# Patient Record
Sex: Female | Born: 1985
Health system: Southern US, Community
[De-identification: ages and names within clinical notes are randomized; demographics above are authoritative.]

## PROBLEM LIST (undated history)

## (undated) ENCOUNTER — Inpatient Hospital Stay (HOSPITAL_COMMUNITY): Payer: Self-pay

## (undated) DIAGNOSIS — K219 Gastro-esophageal reflux disease without esophagitis: Secondary | ICD-10-CM

## (undated) DIAGNOSIS — F1911 Other psychoactive substance abuse, in remission: Secondary | ICD-10-CM

## (undated) DIAGNOSIS — L409 Psoriasis, unspecified: Secondary | ICD-10-CM

## (undated) DIAGNOSIS — J45909 Unspecified asthma, uncomplicated: Secondary | ICD-10-CM

## (undated) DIAGNOSIS — O139 Gestational [pregnancy-induced] hypertension without significant proteinuria, unspecified trimester: Secondary | ICD-10-CM

## (undated) DIAGNOSIS — R87629 Unspecified abnormal cytological findings in specimens from vagina: Secondary | ICD-10-CM

## (undated) DIAGNOSIS — F329 Major depressive disorder, single episode, unspecified: Secondary | ICD-10-CM

## (undated) DIAGNOSIS — A599 Trichomoniasis, unspecified: Secondary | ICD-10-CM

## (undated) DIAGNOSIS — F419 Anxiety disorder, unspecified: Secondary | ICD-10-CM

## (undated) DIAGNOSIS — G8929 Other chronic pain: Secondary | ICD-10-CM

## (undated) DIAGNOSIS — F32A Depression, unspecified: Secondary | ICD-10-CM

## (undated) DIAGNOSIS — C801 Malignant (primary) neoplasm, unspecified: Secondary | ICD-10-CM

## (undated) DIAGNOSIS — Z803 Family history of malignant neoplasm of breast: Secondary | ICD-10-CM

## (undated) DIAGNOSIS — D649 Anemia, unspecified: Secondary | ICD-10-CM

## (undated) HISTORY — DX: Family history of malignant neoplasm of breast: Z80.3

## (undated) HISTORY — PX: COLPOSCOPY: SHX161

## (undated) HISTORY — DX: Other psychoactive substance abuse, in remission: F19.11

## (undated) HISTORY — DX: Psoriasis, unspecified: L40.9

## (undated) HISTORY — PX: WISDOM TOOTH EXTRACTION: SHX21

---
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
## 2008-05-26 DIAGNOSIS — I10 Essential (primary) hypertension: Secondary | ICD-10-CM

## 2016-07-22 ENCOUNTER — Emergency Department (HOSPITAL_COMMUNITY): Payer: Medicaid Other

## 2016-07-22 ENCOUNTER — Emergency Department (HOSPITAL_COMMUNITY)
Admission: EM | Admit: 2016-07-22 | Discharge: 2016-07-22 | Disposition: A | Discharge status: Home / Self Care | Payer: Medicaid Other | Attending: Emergency Medicine | Admitting: Emergency Medicine

## 2016-07-22 ENCOUNTER — Encounter (HOSPITAL_COMMUNITY): Payer: Self-pay | Admitting: *Deleted

## 2016-07-22 DIAGNOSIS — F17200 Nicotine dependence, unspecified, uncomplicated: Secondary | ICD-10-CM | POA: Insufficient documentation

## 2016-07-22 DIAGNOSIS — R1030 Lower abdominal pain, unspecified: Secondary | ICD-10-CM | POA: Diagnosis present

## 2016-07-22 DIAGNOSIS — B9689 Other specified bacterial agents as the cause of diseases classified elsewhere: Secondary | ICD-10-CM

## 2016-07-22 DIAGNOSIS — N760 Acute vaginitis: Secondary | ICD-10-CM | POA: Insufficient documentation

## 2016-07-22 DIAGNOSIS — R102 Pelvic and perineal pain: Secondary | ICD-10-CM | POA: Insufficient documentation

## 2016-07-22 DIAGNOSIS — Z79899 Other long term (current) drug therapy: Secondary | ICD-10-CM | POA: Diagnosis not present

## 2016-07-22 LAB — URINALYSIS, ROUTINE W REFLEX MICROSCOPIC
Bilirubin Urine: NEGATIVE
GLUCOSE, UA: NEGATIVE mg/dL
Hgb urine dipstick: NEGATIVE
KETONES UR: NEGATIVE mg/dL
LEUKOCYTES UA: NEGATIVE
NITRITE: NEGATIVE
PH: 7 (ref 5.0–8.0)
Protein, ur: NEGATIVE mg/dL
SPECIFIC GRAVITY, URINE: 1.027 (ref 1.005–1.030)

## 2016-07-22 LAB — CBC
HEMATOCRIT: 38.9 % (ref 36.0–46.0)
HEMOGLOBIN: 12.8 g/dL (ref 12.0–15.0)
MCH: 31.8 pg (ref 26.0–34.0)
MCHC: 32.9 g/dL (ref 30.0–36.0)
MCV: 96.5 fL (ref 78.0–100.0)
Platelets: 261 10*3/uL (ref 150–400)
RBC: 4.03 MIL/uL (ref 3.87–5.11)
RDW: 13 % (ref 11.5–15.5)
WBC: 6.9 10*3/uL (ref 4.0–10.5)

## 2016-07-22 LAB — COMPREHENSIVE METABOLIC PANEL
ALBUMIN: 3.5 g/dL (ref 3.5–5.0)
ALT: 12 U/L — ABNORMAL LOW (ref 14–54)
ANION GAP: 6 (ref 5–15)
AST: 18 U/L (ref 15–41)
Alkaline Phosphatase: 55 U/L (ref 38–126)
BILIRUBIN TOTAL: 0.4 mg/dL (ref 0.3–1.2)
BUN: 11 mg/dL (ref 6–20)
CHLORIDE: 108 mmol/L (ref 101–111)
CO2: 25 mmol/L (ref 22–32)
Calcium: 8.9 mg/dL (ref 8.9–10.3)
Creatinine, Ser: 1.06 mg/dL — ABNORMAL HIGH (ref 0.44–1.00)
GFR calc Af Amer: >60 mL/min (ref >60)
GLUCOSE: 82 mg/dL (ref 65–99)
POTASSIUM: 3.9 mmol/L (ref 3.5–5.1)
Sodium: 139 mmol/L (ref 135–145)
TOTAL PROTEIN: 6.4 g/dL — AB (ref 6.5–8.1)

## 2016-07-22 LAB — WET PREP, GENITAL
SPERM: NONE SEEN
TRICH WET PREP: NONE SEEN
YEAST WET PREP: NONE SEEN

## 2016-07-22 LAB — I-STAT BETA HCG BLOOD, ED (MC, WL, AP ONLY): I-stat hCG, quantitative: <5 m[IU]/mL (ref <5)

## 2016-07-22 LAB — LIPASE, BLOOD: LIPASE: 18 U/L (ref 11–51)

## 2016-07-22 MED ORDER — METRONIDAZOLE 500 MG PO TABS: 500.0000 mg | ORAL_TABLET | Freq: Two times a day (BID) | ORAL | 0 refills | Status: DC

## 2016-07-22 NOTE — ED Notes (Signed)
Pt is in stable condition upon d/c and ambulates from ED. 

## 2016-07-22 NOTE — ED Provider Notes (Signed)
[facility] DEPT Provider Note   CSN: [id number] Arrival date & time: 07/22/16  1254  First Provider Contact:  First MD Initiated Contact with Patient 07/22/16 1413        History   Chief Complaint Chief Complaint  Patient presents with  . Abdominal Pain    HPI Terri Estes is a 30 y.o. female.  HPI   Patient presents with 1 month of lower abdominal pain and pressure that feels like "something needs to come out."  Had two periods this month.  Odd vaginal smell without discharge.  LMP July 18.  Denies urinary symptoms.  Took laxative due to pressure and has had normal but increased frequency bowel movements.  This did not change her symptoms.  Is sexually active, does not use protection, no new sexual partners.  No known fevers, N/V.    History reviewed. No pertinent past medical history.  There are no active problems to display for this patient.   History reviewed. No pertinent surgical history.  OB History    No data available       Home Medications    Prior to Admission medications   Medication Sig Start Date End Date Taking? Authorizing Provider  metroNIDAZOLE (FLAGYL) 500 MG tablet Take 1 tablet (500 mg total) by mouth 2 (two) times daily. 07/22/16   Clayton Bibles, PA-C    Family History History reviewed. No pertinent family history.  Social History Social History  Substance Use Topics  . Smoking status: Current Some Day Smoker  . Smokeless tobacco: Never Used  . Alcohol use No     Allergies   Review of patient's allergies indicates no known allergies.   Review of Systems Review of Systems  All other systems reviewed and are negative.    Physical Exam Updated Vital Signs BP 124/74   Pulse 61   Temp 98.4 F (36.9 C) (Oral)   Resp 18   LMP 07/13/2016   SpO2 100%   Physical Exam  Constitutional: She appears well-developed and well-nourished. No distress.  HENT:  Head: Normocephalic and atraumatic.  Neck: Neck supple.    Cardiovascular: Normal rate and regular rhythm.   Pulmonary/Chest: Effort normal and breath sounds normal. No respiratory distress. She has no wheezes. She has no rales.  Abdominal: Soft. She exhibits no distension. There is tenderness. There is no rebound and no guarding.  Genitourinary:  Genitourinary Comments: Small amount of thick white discharge in vagina.  No cervical motion tenderness.  Midline and right adnexal tenderness on bimanual exam without palpation of mass, no fullness, no left sided tenderness.    Neurological: She is alert.  Skin: She is not diaphoretic.  Nursing note and vitals reviewed.    ED Treatments / Results  Labs (all labs ordered are listed, but only abnormal results are displayed) Labs Reviewed  WET PREP, GENITAL - Abnormal; Notable for the following:       Result Value   Clue Cells Wet Prep HPF POC PRESENT (*)    WBC, Wet Prep HPF POC MODERATE (*)    All other components within normal limits  COMPREHENSIVE METABOLIC PANEL - Abnormal; Notable for the following:    Creatinine, Ser 1.06 (*)    Total Protein 6.4 (*)    ALT 12 (*)    All other components within normal limits  LIPASE, BLOOD  CBC  URINALYSIS, ROUTINE W REFLEX MICROSCOPIC (NOT AT [facility])  RPR  HIV ANTIBODY (ROUTINE TESTING)  I-STAT BETA HCG BLOOD, ED ([facility], [facility], [facility] ONLY)  GC/CHLAMYDIA PROBE AMP ([place]) NOT AT [facility]    EKG  EKG Interpretation None       Radiology US Transvaginal Non-ob  Result Date: 07/22/2016 CLINICAL DATA:  Pelvic and right adnexal pain for the past month, history of pelvic inflammatory disease and previous Cesarean section. Onset of last normal menstrual period was July 13, 2016 EXAM: TRANSABDOMINAL AND TRANSVAGINAL ULTRASOUND OF PELVIS DOPPLER ULTRASOUND OF OVARIES TECHNIQUE: Both transabdominal and transvaginal ultrasound examinations of the pelvis were performed. Transabdominal technique was performed for global imaging of the pelvis including uterus, ovaries,  adnexal regions, and pelvic cul-de-sac. It was necessary to proceed with endovaginal exam following the transabdominal exam to visualize the uterus, endometrium, ovaries, and adnexal regions. Color and duplex Doppler ultrasound was utilized to evaluate blood flow to the ovaries. COMPARISON:  None in PACs FINDINGS: Uterus Measurements: 7.8 x 4.5 x 4.7 cm. The uterus exhibits normal echotexture. No fibroids are observed. Endometrium Thickness: 4.5 mm. No endometrial masses or fluid collections are observed. Right ovary Measurements: 2.9 x 2.0 x 2.6 cm. There are multiple developing follicles with the largest measuring 1.7 cm in greatest dimension. Left ovary Measurements: 2.5 x 1.9 x 2.0 cm. There are multiple developing follicles with the largest measuring 1.2 cm in greatest dimension. Pulsed Doppler evaluation of both ovaries demonstrates normal low-resistance arterial and venous waveforms. Other findings There is a trace of free pelvic fluid. IMPRESSION: 1. No evidence of a tubo-ovarian abscess nor other acute adnexal abnormality. No evidence of ectopic pregnancy 2. Developing follicles within both ovaries. 3. Normal appearance of the uterus and endometrium. No evidence of IUP 4. Trace amount of free pelvic fluid. Electronically Signed   By: David  Martinique M.D.   On: 07/22/2016 17:15  US Pelvis Complete  Result Date: 07/22/2016 CLINICAL DATA:  Pelvic and right adnexal pain for the past month, history of pelvic inflammatory disease and previous Cesarean section. Onset of last normal menstrual period was July 13, 2016 EXAM: TRANSABDOMINAL AND TRANSVAGINAL ULTRASOUND OF PELVIS DOPPLER ULTRASOUND OF OVARIES TECHNIQUE: Both transabdominal and transvaginal ultrasound examinations of the pelvis were performed. Transabdominal technique was performed for global imaging of the pelvis including uterus, ovaries, adnexal regions, and pelvic cul-de-sac. It was necessary to proceed with endovaginal exam following the  transabdominal exam to visualize the uterus, endometrium, ovaries, and adnexal regions. Color and duplex Doppler ultrasound was utilized to evaluate blood flow to the ovaries. COMPARISON:  None in PACs FINDINGS: Uterus Measurements: 7.8 x 4.5 x 4.7 cm. The uterus exhibits normal echotexture. No fibroids are observed. Endometrium Thickness: 4.5 mm. No endometrial masses or fluid collections are observed. Right ovary Measurements: 2.9 x 2.0 x 2.6 cm. There are multiple developing follicles with the largest measuring 1.7 cm in greatest dimension. Left ovary Measurements: 2.5 x 1.9 x 2.0 cm. There are multiple developing follicles with the largest measuring 1.2 cm in greatest dimension. Pulsed Doppler evaluation of both ovaries demonstrates normal low-resistance arterial and venous waveforms. Other findings There is a trace of free pelvic fluid. IMPRESSION: 1. No evidence of a tubo-ovarian abscess nor other acute adnexal abnormality. No evidence of ectopic pregnancy 2. Developing follicles within both ovaries. 3. Normal appearance of the uterus and endometrium. No evidence of IUP 4. Trace amount of free pelvic fluid. Electronically Signed   By: David  Martinique M.D.   On: 07/22/2016 17:15  [place] Art/ven Flow Abd Pelv Doppler  Result Date: 07/22/2016 CLINICAL DATA:  Pelvic and right adnexal pain for the past month, history  of pelvic inflammatory disease and previous Cesarean section. Onset of last normal menstrual period was July 13, 2016 EXAM: TRANSABDOMINAL AND TRANSVAGINAL ULTRASOUND OF PELVIS DOPPLER ULTRASOUND OF OVARIES TECHNIQUE: Both transabdominal and transvaginal ultrasound examinations of the pelvis were performed. Transabdominal technique was performed for global imaging of the pelvis including uterus, ovaries, adnexal regions, and pelvic cul-de-sac. It was necessary to proceed with endovaginal exam following the transabdominal exam to visualize the uterus, endometrium, ovaries, and adnexal regions. Color and  duplex Doppler ultrasound was utilized to evaluate blood flow to the ovaries. COMPARISON:  None in PACs FINDINGS: Uterus Measurements: 7.8 x 4.5 x 4.7 cm. The uterus exhibits normal echotexture. No fibroids are observed. Endometrium Thickness: 4.5 mm. No endometrial masses or fluid collections are observed. Right ovary Measurements: 2.9 x 2.0 x 2.6 cm. There are multiple developing follicles with the largest measuring 1.7 cm in greatest dimension. Left ovary Measurements: 2.5 x 1.9 x 2.0 cm. There are multiple developing follicles with the largest measuring 1.2 cm in greatest dimension. Pulsed Doppler evaluation of both ovaries demonstrates normal low-resistance arterial and venous waveforms. Other findings There is a trace of free pelvic fluid. IMPRESSION: 1. No evidence of a tubo-ovarian abscess nor other acute adnexal abnormality. No evidence of ectopic pregnancy 2. Developing follicles within both ovaries. 3. Normal appearance of the uterus and endometrium. No evidence of IUP 4. Trace amount of free pelvic fluid. Electronically Signed   By: David  Martinique M.D.   On: 07/22/2016 17:15   Procedures Procedures (including critical care time)  Medications Ordered in ED Medications - No data to display   Initial Impression / Assessment and Plan / ED Course  I have reviewed the triage vital signs and the nursing notes.  Pertinent labs & imaging results that were available during my care of the patient were reviewed by me and considered in my medical decision making (see chart for details).  Clinical Course    Afebrile, nontoxic patient with 1 month of pelvic pain without urinary or bowel symptoms.  Malodor from vagina.  Does have BV.  No clinical e/o PID.  Pt feels she is low risk, declines empiric treatment for STDs.  She is aware these are pending.  Pelvic US unremarkable.   D/C home with flagyl, resources for follow up.   Discussed result, findings, treatment, and follow up  with patient.  Pt given  return precautions.  Pt verbalizes understanding and agrees with plan.       Final Clinical Impressions(s) / ED Diagnoses   Final diagnoses:  Pelvic pain in female  BV (bacterial vaginosis)    New Prescriptions New Prescriptions   METRONIDAZOLE (FLAGYL) 500 MG TABLET    Take 1 tablet (500 mg total) by mouth 2 (two) times daily.     Clayton Bibles, PA-C 07/22/16 [place], MD 07/26/16 [phone]

## 2016-07-22 NOTE — ED Notes (Signed)
Pelvic Cart set up and at bedside ?

## 2016-07-22 NOTE — ED Triage Notes (Signed)
Pt reports lower abd pain for extended amount of time. Had intercourse last night and then reports swelling to vaginal area. Denies urinary symptoms or vaginal discharge.

## 2016-07-22 NOTE — Discharge Instructions (Signed)
Read the information below.  Use the prescribed medication as directed.  Please discuss all new medications with your pharmacist.  You may return to the Emergency Department at any time for worsening condition or any new symptoms that concern you.   If you develop high fevers, worsening abdominal pain, uncontrolled vomiting, or are unable to tolerate fluids by mouth, return to the ER for a recheck.  ° °

## 2016-07-23 LAB — HIV ANTIBODY (ROUTINE TESTING W REFLEX): HIV Screen 4th Generation wRfx: NONREACTIVE

## 2016-07-23 LAB — GC/CHLAMYDIA PROBE AMP (~~LOC~~) NOT AT ARMC
Chlamydia: NEGATIVE
NEISSERIA GONORRHEA: NEGATIVE

## 2016-07-23 LAB — RPR: RPR Ser Ql: NONREACTIVE

## 2016-11-16 ENCOUNTER — Ambulatory Visit (INDEPENDENT_AMBULATORY_CARE_PROVIDER_SITE_OTHER): Payer: Medicaid Other | Admitting: *Deleted

## 2016-11-16 DIAGNOSIS — N912 Amenorrhea, unspecified: Secondary | ICD-10-CM

## 2016-11-16 DIAGNOSIS — Z3201 Encounter for pregnancy test, result positive: Secondary | ICD-10-CM

## 2016-11-16 LAB — POCT URINE PREGNANCY: Preg Test, Ur: POSITIVE — AB

## 2016-11-16 NOTE — Progress Notes (Signed)
Patient states she is concerned about her kidneys and wanted to make sure that her provider had access to her hospital records- she was seen at [facility] ED.  [redacted]w[redacted]d EDD 07/14/2017 Patient is taking prenatal gummies OTC and will continue. NOB appointment to be scheduled

## 2016-12-06 ENCOUNTER — Inpatient Hospital Stay (HOSPITAL_COMMUNITY): Payer: Medicaid Other

## 2016-12-06 ENCOUNTER — Encounter (HOSPITAL_COMMUNITY): Payer: Self-pay | Admitting: *Deleted

## 2016-12-06 ENCOUNTER — Inpatient Hospital Stay (HOSPITAL_COMMUNITY)
Admission: AD | Admit: 2016-12-06 | Discharge: 2016-12-06 | Disposition: A | Discharge status: Home / Self Care | Payer: Medicaid Other | Source: Ambulatory Visit | Attending: Family Medicine | Admitting: Family Medicine

## 2016-12-06 DIAGNOSIS — R109 Unspecified abdominal pain: Secondary | ICD-10-CM | POA: Diagnosis not present

## 2016-12-06 DIAGNOSIS — Z87891 Personal history of nicotine dependence: Secondary | ICD-10-CM | POA: Diagnosis not present

## 2016-12-06 DIAGNOSIS — O219 Vomiting of pregnancy, unspecified: Secondary | ICD-10-CM

## 2016-12-06 DIAGNOSIS — O210 Mild hyperemesis gravidarum: Secondary | ICD-10-CM | POA: Diagnosis present

## 2016-12-06 DIAGNOSIS — Z3A08 8 weeks gestation of pregnancy: Secondary | ICD-10-CM | POA: Diagnosis not present

## 2016-12-06 DIAGNOSIS — O9989 Other specified diseases and conditions complicating pregnancy, childbirth and the puerperium: Secondary | ICD-10-CM

## 2016-12-06 DIAGNOSIS — O26899 Other specified pregnancy related conditions, unspecified trimester: Secondary | ICD-10-CM

## 2016-12-06 HISTORY — DX: Other chronic pain: G89.29

## 2016-12-06 HISTORY — DX: Gestational (pregnancy-induced) hypertension without significant proteinuria, unspecified trimester: O13.9

## 2016-12-06 LAB — URINALYSIS, ROUTINE W REFLEX MICROSCOPIC
Bilirubin Urine: NEGATIVE
GLUCOSE, UA: NEGATIVE mg/dL
HGB URINE DIPSTICK: NEGATIVE
KETONES UR: 80 mg/dL — AB
Nitrite: NEGATIVE
PROTEIN: 100 mg/dL — AB
Specific Gravity, Urine: 1.033 — ABNORMAL HIGH (ref 1.005–1.030)
pH: 5 (ref 5.0–8.0)

## 2016-12-06 LAB — CBC
HCT: 37.1 % (ref 36.0–46.0)
Hemoglobin: 13.2 g/dL (ref 12.0–15.0)
MCH: 32 pg (ref 26.0–34.0)
MCHC: 35.6 g/dL (ref 30.0–36.0)
MCV: 90 fL (ref 78.0–100.0)
Platelets: 275 10*3/uL (ref 150–400)
RBC: 4.12 MIL/uL (ref 3.87–5.11)
RDW: 12.7 % (ref 11.5–15.5)
WBC: 9.4 10*3/uL (ref 4.0–10.5)

## 2016-12-06 LAB — URINALYSIS, MICROSCOPIC (REFLEX)

## 2016-12-06 LAB — ABO/RH: ABO/RH(D): A POS

## 2016-12-06 LAB — HCG, QUANTITATIVE, PREGNANCY: HCG, BETA CHAIN, QUANT, S: 116810 m[IU]/mL — AB (ref <5)

## 2016-12-06 MED ORDER — LACTATED RINGERS IV BOLUS (SEPSIS)
1000.0000 mL | Freq: Once | INTRAVENOUS | Status: AC
  Administered 2016-12-06: 1000 mL via INTRAVENOUS

## 2016-12-06 MED ORDER — FAMOTIDINE IN NACL 20-0.9 MG/50ML-% IV SOLN
20.0000 mg | Freq: Once | INTRAVENOUS | Status: AC
  Administered 2016-12-06: 20 mg via INTRAVENOUS
  Filled 2016-12-06: qty 50

## 2016-12-06 MED ORDER — RANITIDINE HCL 150 MG PO TABS: 150.0000 mg | ORAL_TABLET | Freq: Two times a day (BID) | ORAL | 0 refills | Status: DC

## 2016-12-06 MED ORDER — DEXTROSE 5 % IN LACTATED RINGERS IV BOLUS
1000.0000 mL | Freq: Once | INTRAVENOUS | Status: AC
  Administered 2016-12-06: 1000 mL via INTRAVENOUS

## 2016-12-06 MED ORDER — METOCLOPRAMIDE HCL 10 MG PO TABS: 10.0000 mg | ORAL_TABLET | Freq: Three times a day (TID) | ORAL | 1 refills | Status: DC

## 2016-12-06 MED ORDER — METOCLOPRAMIDE HCL 5 MG/ML IJ SOLN
10.0000 mg | Freq: Once | INTRAMUSCULAR | Status: AC
  Administered 2016-12-06: 10 mg via INTRAVENOUS
  Filled 2016-12-06: qty 2

## 2016-12-06 NOTE — MAU Note (Signed)
Patient states she has not been able to keep anything down since finding out she was pregnant.  Has tried taking Unisom and B6 as well as an OTC motion sickness medication she couldn't remember the name of and neither is working.  States she has had abdominal cramping that she thinks is related to being constipated.  Also having back pain.

## 2016-12-06 NOTE — Discharge Instructions (Signed)
Abdominal Pain During Pregnancy °Belly (abdominal) pain is common during pregnancy. Most of the time, it is not a serious problem. Other times, it can be a sign that something is wrong with the pregnancy. Always tell your doctor if you have belly pain. °Follow these instructions at home: °Monitor your belly pain for any changes. The following actions may help you feel better: °· Do not have sex (intercourse) or put anything in your vagina until you feel better. °· Rest until your pain stops. °· Drink clear fluids if you feel sick to your stomach (nauseous). Do not eat solid food until you feel better. °· Only take medicine as told by your doctor. °· Keep all doctor visits as told. °Get help right away if: °· You are bleeding, leaking fluid, or pieces of tissue come out of your vagina. °· You have more pain or cramping. °· You keep throwing up (vomiting). °· You have pain when you pee (urinate) or have blood in your pee. °· You have a fever. °· You do not feel your baby moving as much. °· You feel very weak or feel like passing out. °· You have trouble breathing, with or without belly pain. °· You have a very bad headache and belly pain. °· You have fluid leaking from your vagina and belly pain. °· You keep having watery poop (diarrhea). °· Your belly pain does not go away after resting, or the pain gets worse. °This information is not intended to replace advice given to you by your health care provider. Make sure you discuss any questions you have with your health care provider. °Document Released: 12/01/2009 Document Revised: 07/21/2016 Document Reviewed: 07/12/2013 °Elsevier Interactive Patient Education © 2017 [employer] ° °

## 2016-12-06 NOTE — MAU Provider Note (Signed)
History     CSN: [id number]  Arrival date and time: 12/06/16 [id number]   First Provider Initiated Contact with Patient 12/06/16 0910      Chief Complaint  Patient presents with  . Morning Sickness  . Dizziness   HPI   Ms. Terri Estes is a 30 y.o. female [phone] @ [redacted]w[redacted]d here in MAU with abdominal pain and vomiting. She is vomiting more than 5 times per day and is spitting a lot too. The pain Is located all over her abdomen. The pain comes and goes. She feels dehydrated.   She denies vaginal bleeding.   OB History    Gravida Para Term Preterm AB Living   5 1 0 1 3 1    SAB TAB Ectopic Multiple Live Births                  Past Medical History:  Diagnosis Date  . Chronic pain   . Pregnancy induced hypertension     Past Surgical History:  Procedure Laterality Date  . CESAREAN SECTION      History reviewed. No pertinent family history.  Social History  Substance Use Topics  . Smoking status: Former Smoker    Types: Cigarettes  . Smokeless tobacco: Never Used  . Alcohol use No    Allergies: No Known Allergies  No prescriptions prior to admission.   Results for orders placed or performed during the hospital encounter of 12/06/16 (from the past 48 hour(s))  Urinalysis, Routine w reflex microscopic     Status: Abnormal   Collection Time: 12/06/16  8:33 AM  Result Value Ref Range   Color, Urine YELLOW YELLOW   APPearance TURBID (A) CLEAR   Specific Gravity, Urine 1.033 (H) 1.005 - 1.030   pH 5.0 5.0 - 8.0   Glucose, UA NEGATIVE NEGATIVE mg/dL   Hgb urine dipstick NEGATIVE NEGATIVE   Bilirubin Urine NEGATIVE NEGATIVE   Ketones, ur 80 (A) NEGATIVE mg/dL   Protein, ur 100 (A) NEGATIVE mg/dL   Nitrite NEGATIVE NEGATIVE   Leukocytes, UA MODERATE (A) NEGATIVE  Urinalysis, Microscopic (reflex)     Status: Abnormal   Collection Time: 12/06/16  8:33 AM  Result Value Ref Range   RBC / HPF 6-30 0 - 5 RBC/hpf   WBC, UA 6-30 0 - 5 WBC/hpf   Bacteria, UA RARE (A)  NONE SEEN   Squamous Epithelial / LPF TOO NUMEROUS TO COUNT (A) NONE SEEN   Mucous PRESENT   CBC     Status: None   Collection Time: 12/06/16  9:30 AM  Result Value Ref Range   WBC 9.4 4.0 - 10.5 K/uL   RBC 4.12 3.87 - 5.11 MIL/uL   Hemoglobin 13.2 12.0 - 15.0 g/dL   HCT 37.1 36.0 - 46.0 %   MCV 90.0 78.0 - 100.0 fL   MCH 32.0 26.0 - 34.0 pg   MCHC 35.6 30.0 - 36.0 g/dL   RDW 12.7 11.5 - 15.5 %   Platelets 275 150 - 400 K/uL  ABO/Rh     Status: None   Collection Time: 12/06/16  9:30 AM  Result Value Ref Range   ABO/RH(D) A POS   hCG, quantitative, pregnancy     Status: Abnormal   Collection Time: 12/06/16  9:30 AM  Result Value Ref Range   hCG, Beta Chain, Quant, S 116,810 (H) <5 mIU/mL    Comment:          GEST. AGE      CONC.  (mIU/mL)   <=  1 WEEK        5 - 50     2 WEEKS       50 - 500     3 WEEKS       100 - 10,000     4 WEEKS     1,000 - 30,000     5 WEEKS     3,500 - 115,000   6-8 WEEKS     12,000 - 270,000    12 WEEKS     15,000 - 220,000        FEMALE AND NON-PREGNANT FEMALE:     LESS THAN 5 mIU/mL    US Ob Comp Less 14 Wks  Result Date: 12/06/2016 CLINICAL DATA:  Nausea, cramping EXAM: OBSTETRIC <14 WK [place] AND TRANSVAGINAL OB US TECHNIQUE: Both transabdominal and transvaginal ultrasound examinations were performed for complete evaluation of the gestation as well as the maternal uterus, adnexal regions, and pelvic cul-de-sac. Transvaginal technique was performed to assess early pregnancy. COMPARISON:  None. FINDINGS: Intrauterine gestational sac: Single Yolk sac:  Visualized Embryo:  Visualized Cardiac Activity: Visualized Heart Rate: 166  bpm MSD:   mm    w     d CRL:  21  mm   8 w   5 d                  [place] EDC: 07/13/2017 Subchorionic hemorrhage:  None visualized. Maternal uterus/adnexae: No adnexal masses or free fluid. IMPRESSION: Eight week 5 day intrauterine pregnancy. Fetal heart rate 166 beats per minute. No acute maternal findings. Electronically Signed   By:  Rolm Baptise M.D.   On: 12/06/2016 10:57   US Ob Transvaginal  Result Date: 12/06/2016 CLINICAL DATA:  Nausea, cramping EXAM: OBSTETRIC <14 WK [place] AND TRANSVAGINAL OB US TECHNIQUE: Both transabdominal and transvaginal ultrasound examinations were performed for complete evaluation of the gestation as well as the maternal uterus, adnexal regions, and pelvic cul-de-sac. Transvaginal technique was performed to assess early pregnancy. COMPARISON:  None. FINDINGS: Intrauterine gestational sac: Single Yolk sac:  Visualized Embryo:  Visualized Cardiac Activity: Visualized Heart Rate: 166  bpm MSD:   mm    w     d CRL:  21  mm   8 w   5 d                  [place] EDC: 07/13/2017 Subchorionic hemorrhage:  None visualized. Maternal uterus/adnexae: No adnexal masses or free fluid. IMPRESSION: Eight week 5 day intrauterine pregnancy. Fetal heart rate 166 beats per minute. No acute maternal findings. Electronically Signed   By: Rolm Baptise M.D.   On: 12/06/2016 10:57   Review of Systems  Constitutional: Negative for fever.  Gastrointestinal: Positive for abdominal pain, nausea and vomiting. Negative for heartburn.   Physical Exam   Blood pressure 109/76, pulse 100, temperature 98.2 F (36.8 C), temperature source Oral, resp. rate 16, height 5\' 2"  (1.575 m), weight 176 lb 1.3 oz (79.9 kg), last menstrual period 10/07/2016, SpO2 95 %.  Physical Exam  Constitutional: She is oriented to person, place, and time. She appears well-developed and well-nourished. No distress.  Respiratory: Effort normal.  GI: There is generalized tenderness. There is no rigidity, no rebound and no guarding.  Musculoskeletal: Normal range of motion.  Neurological: She is alert and oriented to person, place, and time.  Skin: Skin is warm. She is not diaphoretic.  Psychiatric: Her behavior is normal.    MAU Course  Procedures  None  MDM  D5LR bolus X 1 LR bolus X1 Reglan 10 mg IV Pepcid 20 mg IV CBC, ABO, Hcg, US  Abdominal  pain resolved following IV fluids. PO challenge: Pass  Assessment and Plan   A:  SIUP @ [redacted]w[redacted]d  1. Nausea and vomiting in pregnancy   2. Abdominal pain in pregnancy, antepartum     P:  Discharge home in stable condition Rx: Reglan & Zantac Start prenatal care Return to MAU if symptoms worsen Small, frequent meals, Bland diet  Lezlie Lye, NP 12/06/2016 3:53 PM

## 2016-12-07 LAB — HIV ANTIBODY (ROUTINE TESTING W REFLEX): HIV SCREEN 4TH GENERATION: NONREACTIVE

## 2016-12-15 ENCOUNTER — Inpatient Hospital Stay (HOSPITAL_COMMUNITY)
Admission: AD | Admit: 2016-12-15 | Discharge: 2016-12-15 | Disposition: A | Discharge status: Home / Self Care | Payer: Medicaid Other | Source: Ambulatory Visit | Attending: Obstetrics & Gynecology | Admitting: Obstetrics & Gynecology

## 2016-12-15 ENCOUNTER — Encounter (HOSPITAL_COMMUNITY): Payer: Self-pay

## 2016-12-15 DIAGNOSIS — Z87891 Personal history of nicotine dependence: Secondary | ICD-10-CM | POA: Diagnosis not present

## 2016-12-15 DIAGNOSIS — O210 Mild hyperemesis gravidarum: Secondary | ICD-10-CM | POA: Diagnosis not present

## 2016-12-15 DIAGNOSIS — Z3A09 9 weeks gestation of pregnancy: Secondary | ICD-10-CM | POA: Diagnosis not present

## 2016-12-15 DIAGNOSIS — O219 Vomiting of pregnancy, unspecified: Secondary | ICD-10-CM | POA: Diagnosis not present

## 2016-12-15 LAB — COMPREHENSIVE METABOLIC PANEL
ALK PHOS: 67 U/L (ref 38–126)
ALT: 25 U/L (ref 14–54)
ANION GAP: 11 (ref 5–15)
AST: 21 U/L (ref 15–41)
Albumin: 4.2 g/dL (ref 3.5–5.0)
BUN: 13 mg/dL (ref 6–20)
CALCIUM: 9.5 mg/dL (ref 8.9–10.3)
CHLORIDE: 95 mmol/L — AB (ref 101–111)
CO2: 27 mmol/L (ref 22–32)
CREATININE: 0.77 mg/dL (ref 0.44–1.00)
Glucose, Bld: 93 mg/dL (ref 65–99)
Potassium: 3.2 mmol/L — ABNORMAL LOW (ref 3.5–5.1)
Sodium: 133 mmol/L — ABNORMAL LOW (ref 135–145)
Total Bilirubin: 0.7 mg/dL (ref 0.3–1.2)
Total Protein: 8.5 g/dL — ABNORMAL HIGH (ref 6.5–8.1)

## 2016-12-15 LAB — URINALYSIS, ROUTINE W REFLEX MICROSCOPIC
BILIRUBIN URINE: NEGATIVE
GLUCOSE, UA: NEGATIVE mg/dL
HGB URINE DIPSTICK: NEGATIVE
Ketones, ur: 20 mg/dL — AB
NITRITE: NEGATIVE
PROTEIN: 100 mg/dL — AB
Specific Gravity, Urine: 1.033 — ABNORMAL HIGH (ref 1.005–1.030)
pH: 5 (ref 5.0–8.0)

## 2016-12-15 LAB — CBC
HCT: 37.7 % (ref 36.0–46.0)
Hemoglobin: 13.7 g/dL (ref 12.0–15.0)
MCH: 32.3 pg (ref 26.0–34.0)
MCHC: 36.3 g/dL — ABNORMAL HIGH (ref 30.0–36.0)
MCV: 88.9 fL (ref 78.0–100.0)
PLATELETS: 290 10*3/uL (ref 150–400)
RBC: 4.24 MIL/uL (ref 3.87–5.11)
RDW: 12.5 % (ref 11.5–15.5)
WBC: 10.8 10*3/uL — AB (ref 4.0–10.5)

## 2016-12-15 MED ORDER — DEXTROSE 5 % IN LACTATED RINGERS IV BOLUS
1000.0000 mL | Freq: Once | INTRAVENOUS | Status: AC
  Administered 2016-12-15: 1000 mL via INTRAVENOUS

## 2016-12-15 MED ORDER — METOCLOPRAMIDE HCL 5 MG/ML IJ SOLN
10.0000 mg | Freq: Once | INTRAMUSCULAR | Status: AC
  Administered 2016-12-15: 10 mg via INTRAVENOUS
  Filled 2016-12-15: qty 2

## 2016-12-15 MED ORDER — PROMETHAZINE HCL 25 MG/ML IJ SOLN
12.5000 mg | Freq: Once | INTRAMUSCULAR | Status: AC
  Administered 2016-12-15: 12.5 mg via INTRAVENOUS
  Filled 2016-12-15: qty 1

## 2016-12-15 MED ORDER — LACTATED RINGERS IV BOLUS (SEPSIS)
1000.0000 mL | Freq: Once | INTRAVENOUS | Status: AC
  Administered 2016-12-15: 1000 mL via INTRAVENOUS

## 2016-12-15 MED ORDER — FAMOTIDINE IN NACL 20-0.9 MG/50ML-% IV SOLN
20.0000 mg | Freq: Once | INTRAVENOUS | Status: AC
  Administered 2016-12-15: 20 mg via INTRAVENOUS
  Filled 2016-12-15: qty 50

## 2016-12-15 MED ORDER — PROMETHAZINE HCL 25 MG PO TABS: 12.5000 mg | ORAL_TABLET | Freq: Four times a day (QID) | ORAL | 0 refills | Status: DC | PRN

## 2016-12-15 NOTE — MAU Provider Note (Signed)
History     CSN: [id number]  Arrival date and time: 12/15/16 1717   First Provider Initiated Contact with Patient 12/15/16 1947      Chief Complaint  Patient presents with  . Emesis  . Abdominal Pain   HPI   [name] is 30 y.o. female [phone] @ [redacted]w[redacted]d here in MAU with nausea and vomiting. "for the past three days I have been throwing up dark brown stuff".  She denies vaginal bleeding. She is vomiting over 10 times every day. This has been going on for several weeks. "My throat hurts so bad". Patient states she cannot keep her meds down at home.   She denies vaginal bleeding   OB History    Gravida Para Term Preterm AB Living   5 1 0 1 3 1    SAB TAB Ectopic Multiple Live Births                  Past Medical History:  Diagnosis Date  . Chronic pain   . Pregnancy induced hypertension     Past Surgical History:  Procedure Laterality Date  . CESAREAN SECTION      Family History  Problem Relation Age of Onset  . Kidney disease Maternal Grandmother   . Kidney disease Maternal Grandfather   . Kidney disease Paternal Grandmother   . Diabetes Paternal Grandmother   . Kidney disease Paternal Grandfather     Social History  Substance Use Topics  . Smoking status: Former Smoker    Types: Cigarettes  . Smokeless tobacco: Never Used  . Alcohol use No    Allergies: No Known Allergies  Prescriptions Prior to Admission  Medication Sig Dispense Refill Last Dose  . calcium carbonate (TUMS - DOSED IN MG ELEMENTAL CALCIUM) 500 MG chewable tablet Chew 2 tablets by mouth daily as needed for indigestion or heartburn.   Past Week at Unknown time  . metoCLOPramide (REGLAN) 10 MG tablet Take 1 tablet (10 mg total) by mouth 3 (three) times daily before meals. 90 tablet 1 Past Week at Unknown time  . ranitidine (ZANTAC) 150 MG tablet Take 1 tablet (150 mg total) by mouth 2 (two) times daily. 60 tablet 0 12/14/2016 at Unknown time   Results for orders placed or performed  during the hospital encounter of 12/15/16 (from the past 48 hour(s))  Urinalysis, Routine w reflex microscopic     Status: Abnormal   Collection Time: 12/15/16  6:38 PM  Result Value Ref Range   Color, Urine AMBER (A) YELLOW    Comment: BIOCHEMICALS MAY BE AFFECTED BY COLOR   APPearance CLOUDY (A) CLEAR   Specific Gravity, Urine 1.033 (H) 1.005 - 1.030   pH 5.0 5.0 - 8.0   Glucose, UA NEGATIVE NEGATIVE mg/dL   Hgb urine dipstick NEGATIVE NEGATIVE   Bilirubin Urine NEGATIVE NEGATIVE   Ketones, ur 20 (A) NEGATIVE mg/dL   Protein, ur 100 (A) NEGATIVE mg/dL   Nitrite NEGATIVE NEGATIVE   Leukocytes, UA MODERATE (A) NEGATIVE   RBC / HPF 6-30 0 - 5 RBC/hpf   WBC, UA 6-30 0 - 5 WBC/hpf   Bacteria, UA RARE (A) NONE SEEN   Squamous Epithelial / LPF TOO NUMEROUS TO COUNT (A) NONE SEEN   Mucous PRESENT     Review of Systems  Constitutional: Positive for malaise/fatigue. Negative for chills and fever.  Gastrointestinal: Positive for abdominal pain, heartburn, nausea and vomiting. Negative for blood in stool, constipation, diarrhea and melena.  Musculoskeletal: Positive for back pain.  Neurological: Positive for dizziness and weakness.   Physical Exam   Blood pressure 117/93, pulse 87, temperature 98.4 F (36.9 C), temperature source Oral, resp. rate 18, weight 169 lb 12.8 oz (77 kg), last menstrual period 10/07/2016, SpO2 97 %.  Physical Exam  Nursing note and vitals reviewed. Constitutional: She is oriented to person, place, and time. She appears well-developed and well-nourished. No distress.  HENT:  Head: Normocephalic.  Cardiovascular: Normal rate.   Respiratory: Effort normal.  GI: Soft. She exhibits no distension. There is no tenderness. There is no rebound.  Musculoskeletal: Normal range of motion.  Neurological: She is alert and oriented to person, place, and time.  Skin: Skin is warm. She is not diaphoretic.  Psychiatric: Her behavior is normal.   Results for orders placed  or performed during the hospital encounter of 12/15/16 (from the past 24 hour(s))  Urinalysis, Routine w reflex microscopic     Status: Abnormal   Collection Time: 12/15/16  6:38 PM  Result Value Ref Range   Color, Urine AMBER (A) YELLOW   APPearance CLOUDY (A) CLEAR   Specific Gravity, Urine 1.033 (H) 1.005 - 1.030   pH 5.0 5.0 - 8.0   Glucose, UA NEGATIVE NEGATIVE mg/dL   Hgb urine dipstick NEGATIVE NEGATIVE   Bilirubin Urine NEGATIVE NEGATIVE   Ketones, ur 20 (A) NEGATIVE mg/dL   Protein, ur 100 (A) NEGATIVE mg/dL   Nitrite NEGATIVE NEGATIVE   Leukocytes, UA MODERATE (A) NEGATIVE   RBC / HPF 6-30 0 - 5 RBC/hpf   WBC, UA 6-30 0 - 5 WBC/hpf   Bacteria, UA RARE (A) NONE SEEN   Squamous Epithelial / LPF TOO NUMEROUS TO COUNT (A) NONE SEEN   Mucous PRESENT   Comprehensive metabolic panel     Status: Abnormal   Collection Time: 12/15/16  8:16 PM  Result Value Ref Range   Sodium 133 (L) 135 - 145 mmol/L   Potassium 3.2 (L) 3.5 - 5.1 mmol/L   Chloride 95 (L) 101 - 111 mmol/L   CO2 27 22 - 32 mmol/L   Glucose, Bld 93 65 - 99 mg/dL   BUN 13 6 - 20 mg/dL   Creatinine, Ser 0.77 0.44 - 1.00 mg/dL   Calcium 9.5 8.9 - 10.3 mg/dL   Total Protein 8.5 (H) 6.5 - 8.1 g/dL   Albumin 4.2 3.5 - 5.0 g/dL   AST 21 15 - 41 U/L   ALT 25 14 - 54 U/L   Alkaline Phosphatase 67 38 - 126 U/L   Total Bilirubin 0.7 0.3 - 1.2 mg/dL   GFR calc non Af Amer >60 >60 mL/min   GFR calc Af Amer >60 >60 mL/min   Anion gap 11 5 - 15  CBC     Status: Abnormal   Collection Time: 12/15/16  8:16 PM  Result Value Ref Range   WBC 10.8 (H) 4.0 - 10.5 K/uL   RBC 4.24 3.87 - 5.11 MIL/uL   Hemoglobin 13.7 12.0 - 15.0 g/dL   HCT 37.7 36.0 - 46.0 %   MCV 88.9 78.0 - 100.0 fL   MCH 32.3 26.0 - 34.0 pg   MCHC 36.3 (H) 30.0 - 36.0 g/dL   RDW 12.5 11.5 - 15.5 %   Platelets 290 150 - 400 K/uL    MAU Course  Procedures  None  MDM  Patient actively spitting while in MAU. The vomit in her bag is clear.  Report given  to Marcille Buffy CNM  Who resumes care  of the patient.   LR bolus X 1 Phenergan 12.5 mg X 1 IV Reglan 10 mg IV X 1 Pepcid 20 mg Iv X 1 CBC CMP  Lezlie Lye, NP 2149: Patient has had meds as above and 2L of fluids. She is feeling better, and has tolerated PO  Assessment and Plan   1. Nausea and vomiting during pregnancy    DC home Comfort measures reviewed  1st Trimester precautions  RX: phenergan PRN #[id number]  Return to MAU as needed FU with OB as planned  Follow-up Information    [facility] Follow up.   Contact information: [address] [place] [address] [phone]

## 2016-12-15 NOTE — MAU Note (Signed)
Been throwing up constantly.  Still can't keep anything down.  Been throwing up brown looking stuff.  Has fallen twice.  Feels weak.  Having stomach pain, sides and upper abd.  Having problems with acid reflux, chest is just burning.

## 2016-12-15 NOTE — Discharge Instructions (Signed)
Morning Sickness °Morning sickness is when you feel sick to your stomach (nauseous) during pregnancy. This nauseous feeling may or may not come with vomiting. It often occurs in the morning but can be a problem any time of day. Morning sickness is most common during the first trimester, but it may continue throughout pregnancy. While morning sickness is unpleasant, it is usually harmless unless you develop severe and continual vomiting (hyperemesis gravidarum). This condition requires more intense treatment. °What are the causes? °The cause of morning sickness is not completely known but seems to be related to normal hormonal changes that occur in pregnancy. °What increases the risk? °You are at greater risk if you: °· Experienced nausea or vomiting before your pregnancy. °· Had morning sickness during a previous pregnancy. °· Are pregnant with more than one baby, such as twins. ° °How is this treated? °Do not use any medicines (prescription, over-the-counter, or herbal) for morning sickness without first talking to your health care provider. Your health care provider may prescribe or recommend: °· Vitamin B6 supplements. °· Anti-nausea medicines. °· The herbal medicine ginger. ° °Follow these instructions at home: °· Only take over-the-counter or prescription medicines as directed by your health care provider. °· Taking multivitamins before getting pregnant can prevent or decrease the severity of morning sickness in most women. °· Eat a piece of dry toast or unsalted crackers before getting out of bed in the morning. °· Eat five or six small meals a day. °· Eat dry and bland foods (rice, baked potato). Foods high in carbohydrates are often helpful. °· Do not drink liquids with your meals. Drink liquids between meals. °· Avoid greasy, fatty, and spicy foods. °· Get someone to cook for you if the smell of any food causes nausea and vomiting. °· If you feel nauseous after taking prenatal vitamins, take the vitamins at  night or with a snack. °· Snack on protein foods (nuts, yogurt, cheese) between meals if you are hungry. °· Eat unsweetened gelatins for desserts. °· Wearing an acupressure wristband (worn for sea sickness) may be helpful. °· Acupuncture may be helpful. °· Do not smoke. °· Get a humidifier to keep the air in your house free of odors. °· Get plenty of fresh air. °Contact a health care provider if: °· Your home remedies are not working, and you need medicine. °· You feel dizzy or lightheaded. °· You are losing weight. °Get help right away if: °· You have persistent and uncontrolled nausea and vomiting. °· You pass out (faint). °This information is not intended to replace advice given to you by your health care provider. Make sure you discuss any questions you have with your health care provider. °Document Released: 02/03/2007 Document Revised: 05/20/2016 Document Reviewed: 05/30/2013 °Elsevier Interactive Patient Education © 2017 [employer] ° °

## 2016-12-16 ENCOUNTER — Ambulatory Visit (INDEPENDENT_AMBULATORY_CARE_PROVIDER_SITE_OTHER): Payer: Medicaid Other | Admitting: Certified Nurse Midwife

## 2016-12-16 ENCOUNTER — Encounter: Payer: Self-pay | Admitting: Certified Nurse Midwife

## 2016-12-16 ENCOUNTER — Other Ambulatory Visit (HOSPITAL_COMMUNITY)
Admission: RE | Admit: 2016-12-16 | Discharge: 2016-12-16 | Disposition: A | Discharge status: Home / Self Care | Payer: Medicaid Other | Source: Ambulatory Visit | Attending: Obstetrics | Admitting: Obstetrics

## 2016-12-16 VITALS — BP 117/82 | HR 97 | Wt 172.0 lb

## 2016-12-16 DIAGNOSIS — Z01411 Encounter for gynecological examination (general) (routine) with abnormal findings: Secondary | ICD-10-CM | POA: Diagnosis not present

## 2016-12-16 DIAGNOSIS — O0990 Supervision of high risk pregnancy, unspecified, unspecified trimester: Secondary | ICD-10-CM

## 2016-12-16 DIAGNOSIS — O161 Unspecified maternal hypertension, first trimester: Secondary | ICD-10-CM

## 2016-12-16 DIAGNOSIS — Z8759 Personal history of other complications of pregnancy, childbirth and the puerperium: Secondary | ICD-10-CM

## 2016-12-16 DIAGNOSIS — O09219 Supervision of pregnancy with history of pre-term labor, unspecified trimester: Secondary | ICD-10-CM

## 2016-12-16 DIAGNOSIS — O169 Unspecified maternal hypertension, unspecified trimester: Secondary | ICD-10-CM

## 2016-12-16 DIAGNOSIS — O09211 Supervision of pregnancy with history of pre-term labor, first trimester: Secondary | ICD-10-CM

## 2016-12-16 DIAGNOSIS — B9689 Other specified bacterial agents as the cause of diseases classified elsewhere: Secondary | ICD-10-CM

## 2016-12-16 DIAGNOSIS — O09292 Supervision of pregnancy with other poor reproductive or obstetric history, second trimester: Secondary | ICD-10-CM | POA: Insufficient documentation

## 2016-12-16 DIAGNOSIS — O09299 Supervision of pregnancy with other poor reproductive or obstetric history, unspecified trimester: Secondary | ICD-10-CM | POA: Insufficient documentation

## 2016-12-16 DIAGNOSIS — Z3481 Encounter for supervision of other normal pregnancy, first trimester: Secondary | ICD-10-CM | POA: Diagnosis not present

## 2016-12-16 DIAGNOSIS — B3731 Acute candidiasis of vulva and vagina: Secondary | ICD-10-CM

## 2016-12-16 DIAGNOSIS — O09291 Supervision of pregnancy with other poor reproductive or obstetric history, first trimester: Secondary | ICD-10-CM | POA: Diagnosis not present

## 2016-12-16 DIAGNOSIS — O219 Vomiting of pregnancy, unspecified: Secondary | ICD-10-CM

## 2016-12-16 DIAGNOSIS — B373 Candidiasis of vulva and vagina: Secondary | ICD-10-CM | POA: Diagnosis not present

## 2016-12-16 DIAGNOSIS — Z98891 History of uterine scar from previous surgery: Secondary | ICD-10-CM

## 2016-12-16 DIAGNOSIS — O34219 Maternal care for unspecified type scar from previous cesarean delivery: Secondary | ICD-10-CM

## 2016-12-16 DIAGNOSIS — N760 Acute vaginitis: Principal | ICD-10-CM

## 2016-12-16 DIAGNOSIS — O98811 Other maternal infectious and parasitic diseases complicating pregnancy, first trimester: Secondary | ICD-10-CM | POA: Diagnosis not present

## 2016-12-16 MED ORDER — METRONIDAZOLE 0.75 % VA GEL: 1.0000 | Freq: Two times a day (BID) | VAGINAL | 0 refills | Status: DC

## 2016-12-16 MED ORDER — TERCONAZOLE 0.8 % VA CREA: 1.0000 | TOPICAL_CREAM | Freq: Every day | VAGINAL | 0 refills | Status: DC

## 2016-12-16 MED ORDER — DOXYLAMINE-PYRIDOXINE 10-10 MG PO TBEC: DELAYED_RELEASE_TABLET | ORAL | 4 refills | Status: DC

## 2016-12-16 NOTE — Addendum Note (Signed)
Addended by: Maryruth Eve on: 12/16/2016 01:15 PM   Modules accepted: Orders

## 2016-12-16 NOTE — Progress Notes (Signed)
Pt discharged from [facility] last d/t hyperemesis. Has child with CP d/t shaking baby.

## 2016-12-16 NOTE — Progress Notes (Signed)
Subjective:    Terri Estes is being seen today for her first obstetrical visit.  This is a planned pregnancy. She is at [redacted]w[redacted]d gestation. Her obstetrical history is significant for preterm delivery at 32 weeks with preeclampsia. Relationship with FOB: significant other, living together. Patient does intend to breast feed. Pregnancy history fully reviewed.  The information documented in the HPI was reviewed and verified.  Menstrual History: OB History    Gravida Para Term Preterm AB Living   5 1 0 1 3 1    SAB TAB Ectopic Multiple Live Births           1       Patient's last menstrual period was 10/07/2016 (exact date).    Past Medical History:  Diagnosis Date  . Chronic pain   . Pregnancy induced hypertension     Past Surgical History:  Procedure Laterality Date  . CESAREAN SECTION       (Not in a hospital admission) No Known Allergies  Social History  Substance Use Topics  . Smoking status: Former Smoker    Types: Cigarettes  . Smokeless tobacco: Never Used  . Alcohol use No    Family History  Problem Relation Age of Onset  . Kidney disease Maternal Grandmother   . Kidney disease Maternal Grandfather   . Kidney disease Paternal Grandmother   . Diabetes Paternal Grandmother   . Kidney disease Paternal Grandfather      Review of Systems Constitutional: negative for weight loss Gastrointestinal: negative for vomiting Genitourinary:negative for genital lesions and vaginal discharge and dysuria Musculoskeletal:negative for back pain Behavioral/Psych: negative for abusive relationship, depression, illegal drug usage and tobacco use    Objective:    BP 117/82   Pulse 97   Wt 172 lb (78 kg)   LMP 10/07/2016 (Exact Date)   BMI 31.46 kg/m  General Appearance:    Alert, cooperative, no distress, appears stated age  Head:    Normocephalic, without obvious abnormality, atraumatic  Eyes:    PERRL, conjunctiva/corneas clear, EOM's intact, fundi    benign, both  eyes  Ears:    Normal TM's and external ear canals, both ears  Nose:   Nares normal, septum midline, mucosa normal, no drainage    or sinus tenderness  Throat:   Lips, mucosa, and tongue normal; teeth and gums normal  Neck:   Supple, symmetrical, trachea midline, no adenopathy;    thyroid:  no enlargement/tenderness/nodules; no carotid   bruit or JVD  Back:     Symmetric, no curvature, ROM normal, no CVA tenderness  Lungs:     Clear to auscultation bilaterally, respirations unlabored  Chest Wall:    No tenderness or deformity   Heart:    Regular rate and rhythm, S1 and S2 normal, no murmur, rub   or gallop  Breast Exam:    No tenderness, masses, or nipple abnormality  Abdomen:     Soft, non-tender, bowel sounds active all four quadrants,    no masses, no organomegaly  Genitalia:    Normal female without lesion, discharge or tenderness  Extremities:   Extremities normal, atraumatic, no cyanosis or edema  Pulses:   2+ and symmetric all extremities  Skin:   Skin color, texture, turgor normal, no rashes or lesions  Lymph nodes:   Cervical, supraclavicular, and axillary nodes normal  Neurologic:   CNII-XII intact, normal strength, sensation and reflexes    throughout         Cervix:   Long, thick, closed  and posterior.  FHR:160's   By doppler. FH; size less    than U.     Lab Review Urine pregnancy test Labs reviewed yes Radiologic studies reviewed yes Assessment:    Pregnancy at [redacted]w[redacted]d weeks   H/O preterm delivery  H/O eclamptic seizure and C-section for preeclampsia  BV  Yeast vaginitis  N&V in early pregnancy: diclegis started.  Plan:    MFM consult and [place] ordered.  Not a candidate for 17-P at this time due to hx of eclampsia in previous pregnancy as the reason for delivery at 32 weeks.  Discussed with Dr. Mariane Masters.    Prenatal vitamins.  Counseling provided regarding continued use of seat belts, cessation of alcohol consumption, smoking or use of illicit drugs; infection  precautions i.e., influenza/TDAP immunizations, toxoplasmosis,CMV, parvovirus, listeria and varicella; workplace safety, exercise during pregnancy; routine dental care, safe medications, sexual activity, hot tubs, saunas, pools, travel, caffeine use, fish and methlymercury, potential toxins, hair treatments, varicose veins Weight gain recommendations per IOM guidelines reviewed: underweight/BMI< 18.5--> gain 28 - 40 lbs; normal weight/BMI 18.5 - 24.9--> gain 25 - 35 lbs; overweight/BMI 25 - 29.9--> gain 15 - 25 lbs; obese/BMI >30->gain  11 - 20 lbs Problem list reviewed and updated. FIRST/CF mutation testing/NIPT/QUAD SCREEN/fragile X/Ashkenazi Jewish population testing/Spinal muscular atrophy discussed: ordered. Role of ultrasound in pregnancy discussed; fetal survey: requested. Amniocentesis discussed: not indicated. VBAC calculator score: VBAC consent form provided Meds ordered this encounter  Medications  . metroNIDAZOLE (METROGEL VAGINAL) 0.75 % vaginal gel    Sig: Place 1 Applicatorful vaginally 2 (two) times daily.    Dispense:  70 g    Refill:  0  . terconazole (TERAZOL 3) 0.8 % vaginal cream    Sig: Place 1 applicator vaginally at bedtime.    Dispense:  20 g    Refill:  0  . Doxylamine-Pyridoxine (DICLEGIS) 10-10 MG TBEC    Sig: Take 1 tablet with breakfast and lunch.  Take 2 tablets at bedtime.    Dispense:  100 tablet    Refill:  4   Orders Placed This Encounter  Procedures  . Culture, OB Urine  . [place] MFM OB COMP + 14 WK    Standing Status:   Future    Standing Expiration Date:   02/16/2018    Order Specific Question:   Reason for Exam (SYMPTOM  OR DIAGNOSIS REQUIRED)    Answer:   hx of preterm delivery, hx of Preeclampsia    Order Specific Question:   Preferred imaging location?    Answer:   [facility]-Ultrasound  . Obstetric Panel, Including HIV  . MaterniT21  plus Core+ESS+SCA, Blood    Order Specific Question:   Is the patient insulin dependent?    Answer:   No    Order  Specific Question:   Please enter gestational age. This should be expressed as weeks AND days, i.e. 16w 6d. Enter weeks here. Enter days in next question.    Answer:   27    Order Specific Question:   Please enter gestational age. This should be expressed as weeks AND days, i.e. 16w 6d. Enter days here. Enter weeks in previous question.    Answer:   0    Order Specific Question:   How was gestational age calculated?    Answer:   LMP    Order Specific Question:   Please give the date of LMP OR Ultrasound OR Estimated date of delivery.    Answer:   10/07/2016  Order Specific Question:   Number of Fetuses (Type of Pregnancy):    Answer:   1    Order Specific Question:   Indications for performing the test? (please choose all that apply):    Answer:   Routine screening    Order Specific Question:   If this is a repeat specimen, please indicate the reason:    Answer:   Early gestational age    Order Specific Question:   Please specify the patient's race: (C=White/Caucasion, B=Black, I=Native American, A=Asian, H=Hispanic, O=Other, U=Unknown)    Answer:   B    Order Specific Question:   Donor Egg - indicate if the egg was obtained from in vitro fertilization.    Answer:   N    Order Specific Question:   Age of Egg Donor.    Answer:   0    Order Specific Question:   Prior Down Syndrome/ONTD screening during current pregnancy.    Answer:   N    Order Specific Question:   Prior First Trimester Testing    Answer:   N    Order Specific Question:   Prior Second Trimester Testing    Answer:   N    Order Specific Question:   Family History of Neural Tube Defects    Answer:   N    Order Specific Question:   Prior Pregnancy with Down Syndrome    Answer:   N    Order Specific Question:   Please give the patient's weight (in pounds)    Answer:   172  . Hemoglobinopathy evaluation  . Hemoglobin A1c  . ToxASSURE Select 13 (MW), Urine  . CMP14+CBC/D/Plt+TSH  . MaterniT21 PLUS Core+SCA    Order  Specific Question:   Is the patient insulin dependent?    Answer:   No    Order Specific Question:   Please enter gestational age. This should be expressed as weeks AND days, i.e. 16w 6d. Enter weeks here. Enter days in next question.    Answer:   4    Order Specific Question:   Please enter gestational age. This should be expressed as weeks AND days, i.e. 16w 6d. Enter days here. Enter weeks in previous question.    Answer:   0    Order Specific Question:   How was gestational age calculated?    Answer:   LMP    Order Specific Question:   Please give the date of LMP OR Ultrasound OR Estimated date of delivery.    Answer:   07/14/2017    Order Specific Question:   Number of Fetuses (Type of Pregnancy):    Answer:   1    Order Specific Question:   Indications for performing the test? (please choose all that apply):    Answer:   Routine screening    Order Specific Question:   Other Indications? (Y=Yes, N=No)    Answer:   Y    Order Specific Question:   Please specify other indications, if any:    Answer:   history of preterm birth    Order Specific Question:   If this is a repeat specimen, please indicate the reason:    Answer:   Not indicated    Order Specific Question:   Please specify the patient's race: (C=White/Caucasion, B=Black, I=Native American, A=Asian, H=Hispanic, O=Other, U=Unknown)    Answer:   B    Order Specific Question:   Donor Egg - indicate if the egg was obtained from in  vitro fertilization.    Answer:   N    Order Specific Question:   Age of Egg Donor.    Answer:   44    Order Specific Question:   Prior Down Syndrome/ONTD screening during current pregnancy.    Answer:   N    Order Specific Question:   Prior First Trimester Testing    Answer:   N    Order Specific Question:   Prior Second Trimester Testing    Answer:   N    Order Specific Question:   Family History of Neural Tube Defects    Answer:   N    Order Specific Question:   Prior Pregnancy with Down  Syndrome    Answer:   N    Order Specific Question:   Please give the patient's weight (in pounds)    Answer:   172  . Cystic Fibrosis Mutation 97  . NuSwab Vaginitis Plus (VG+)  . AMB referral to maternal fetal medicine    Referral Priority:   Routine    Referral Type:   Consultation    Referral Reason:   Specialty Services Required    Number of Visits Requested:   1    Follow up in 4 weeks. 50% of 30 min visit spent on counseling and coordination of care.

## 2016-12-17 LAB — HEMOGLOBIN A1C
Est. average glucose Bld gHb Est-mCnc: 105 mg/dL
HEMOGLOBIN A1C: 5.3 % (ref 4.8–5.6)

## 2016-12-18 LAB — URINE CULTURE, OB REFLEX

## 2016-12-18 LAB — CULTURE, OB URINE

## 2016-12-22 LAB — CYTOLOGY - PAP: Diagnosis: HIGH — AB

## 2016-12-23 LAB — NUSWAB VAGINITIS PLUS (VG+)
Atopobium vaginae: HIGH Score — AB
BVAB 2: HIGH Score — AB
CANDIDA ALBICANS, NAA: NEGATIVE
Candida glabrata, NAA: NEGATIVE
Chlamydia trachomatis, NAA: NEGATIVE
MEGASPHAERA 1: HIGH {score} — AB
NEISSERIA GONORRHOEAE, NAA: NEGATIVE
TRICH VAG BY NAA: POSITIVE — AB

## 2016-12-24 ENCOUNTER — Other Ambulatory Visit: Payer: Self-pay | Admitting: Certified Nurse Midwife

## 2016-12-24 DIAGNOSIS — N760 Acute vaginitis: Secondary | ICD-10-CM

## 2016-12-24 DIAGNOSIS — R87613 High grade squamous intraepithelial lesion on cytologic smear of cervix (HGSIL): Secondary | ICD-10-CM

## 2016-12-24 DIAGNOSIS — B9689 Other specified bacterial agents as the cause of diseases classified elsewhere: Secondary | ICD-10-CM

## 2016-12-24 DIAGNOSIS — A599 Trichomoniasis, unspecified: Secondary | ICD-10-CM | POA: Insufficient documentation

## 2016-12-24 MED ORDER — METRONIDAZOLE 500 MG PO TABS: 500.0000 mg | ORAL_TABLET | Freq: Two times a day (BID) | ORAL | 0 refills | Status: DC

## 2016-12-26 LAB — OBSTETRIC PANEL, INCLUDING HIV
Antibody Screen: NEGATIVE
HEP B S AG: NEGATIVE
HIV Screen 4th Generation wRfx: NONREACTIVE
RPR: NONREACTIVE
Rh Factor: POSITIVE
Rubella Antibodies, IGG: 4.1 index (ref >0.99)

## 2016-12-26 LAB — MATERNIT21 PLUS CORE+SCA
CHROMOSOME 13: NEGATIVE
CHROMOSOME 18: NEGATIVE
CHROMOSOME 21: NEGATIVE
PDF: 0
Y CHROMOSOME: DETECTED

## 2016-12-26 LAB — HEMOGLOBINOPATHY EVALUATION
HGB A: 97.4 % (ref 96.4–98.8)
HGB C: 0 %
HGB S: 0 %
HGB VARIANT: 0 %
Hemoglobin A2 Quantitation: 2.6 % (ref 1.8–3.2)
Hemoglobin F Quantitation: 0 % (ref 0.0–2.0)

## 2016-12-26 LAB — CMP14+CBC/D/PLT+TSH
A/G RATIO: 1.3 (ref 1.2–2.2)
ALT: 17 IU/L (ref 0–32)
AST: 11 IU/L (ref 0–40)
Albumin: 4.1 g/dL (ref 3.5–5.5)
Alkaline Phosphatase: 68 IU/L (ref 39–117)
BASOS ABS: 0 10*3/uL (ref 0.0–0.2)
BILIRUBIN TOTAL: 0.4 mg/dL (ref 0.0–1.2)
BUN/Creatinine Ratio: 12 (ref 9–23)
BUN: 9 mg/dL (ref 6–20)
Basos: 0 %
CALCIUM: 9.4 mg/dL (ref 8.7–10.2)
CHLORIDE: 95 mmol/L — AB (ref 96–106)
CO2: 24 mmol/L (ref 18–29)
CREATININE: 0.77 mg/dL (ref 0.57–1.00)
EOS (ABSOLUTE): 0.1 10*3/uL (ref 0.0–0.4)
Eos: 1 %
GFR calc Af Amer: 122 mL/min/{1.73_m2} (ref >59)
GFR, EST NON AFRICAN AMERICAN: 105 mL/min/{1.73_m2} (ref >59)
GLUCOSE: 92 mg/dL (ref 65–99)
Globulin, Total: 3.1 g/dL (ref 1.5–4.5)
Hematocrit: 39.3 % (ref 34.0–46.6)
Hemoglobin: 14 g/dL (ref 11.1–15.9)
Immature Grans (Abs): 0 10*3/uL (ref 0.0–0.1)
Immature Granulocytes: 0 %
LYMPHS ABS: 2.2 10*3/uL (ref 0.7–3.1)
Lymphs: 26 %
MCH: 31.7 pg (ref 26.6–33.0)
MCHC: 35.6 g/dL (ref 31.5–35.7)
MCV: 89 fL (ref 79–97)
MONOCYTES: 5 %
Monocytes Absolute: 0.5 10*3/uL (ref 0.1–0.9)
NEUTROS ABS: 5.6 10*3/uL (ref 1.4–7.0)
Neutrophils: 68 %
PLATELETS: 250 10*3/uL (ref 150–379)
POTASSIUM: 3.3 mmol/L — AB (ref 3.5–5.2)
RBC: 4.41 x10E6/uL (ref 3.77–5.28)
RDW: 12.9 % (ref 12.3–15.4)
Sodium: 135 mmol/L (ref 134–144)
TOTAL PROTEIN: 7.2 g/dL (ref 6.0–8.5)
TSH: 0.012 u[IU]/mL — ABNORMAL LOW (ref 0.450–4.500)
WBC: 8.3 10*3/uL (ref 3.4–10.8)

## 2016-12-26 LAB — TOXASSURE SELECT 13 (MW), URINE

## 2016-12-26 LAB — CYSTIC FIBROSIS MUTATION 97: Interpretation: NOT DETECTED

## 2016-12-28 ENCOUNTER — Other Ambulatory Visit: Payer: Self-pay | Admitting: Certified Nurse Midwife

## 2016-12-28 DIAGNOSIS — O0990 Supervision of high risk pregnancy, unspecified, unspecified trimester: Secondary | ICD-10-CM

## 2016-12-28 DIAGNOSIS — F1990 Other psychoactive substance use, unspecified, uncomplicated: Secondary | ICD-10-CM | POA: Insufficient documentation

## 2016-12-31 ENCOUNTER — Other Ambulatory Visit: Payer: Self-pay | Admitting: Certified Nurse Midwife

## 2016-12-31 ENCOUNTER — Telehealth: Payer: Self-pay | Admitting: *Deleted

## 2016-12-31 DIAGNOSIS — K219 Gastro-esophageal reflux disease without esophagitis: Secondary | ICD-10-CM

## 2016-12-31 DIAGNOSIS — O99611 Diseases of the digestive system complicating pregnancy, first trimester: Principal | ICD-10-CM

## 2016-12-31 MED ORDER — RANITIDINE HCL 150 MG PO TABS: 150.0000 mg | ORAL_TABLET | Freq: Two times a day (BID) | ORAL | 0 refills | Status: DC

## 2016-12-31 NOTE — Telephone Encounter (Signed)
Patient is still having nausea and vomiting- about twice daily. She is also having acid reflux. Patient is requesting a refill of her Phenergan and Reflux help with her symptoms.

## 2016-12-31 NOTE — Progress Notes (Unsigned)
No phenergan refill due to history of cocaine use this pregnancy.  She has Diclegis.

## 2016-12-31 NOTE — Telephone Encounter (Signed)
No Phenergan refill due to history of cocaine use recently.  She has Diclegis on her medication list that I prescribed at her NOB visit.  I have sent in Zantac for the reflux.  Thank you. R.Denney CNM

## 2017-01-14 ENCOUNTER — Encounter: Payer: Medicaid Other | Admitting: Certified Nurse Midwife

## 2017-01-19 ENCOUNTER — Ambulatory Visit (INDEPENDENT_AMBULATORY_CARE_PROVIDER_SITE_OTHER): Payer: Medicaid Other | Admitting: Obstetrics & Gynecology

## 2017-01-19 VITALS — BP 106/72 | HR 81 | Temp 98.4°F | Wt 168.0 lb

## 2017-01-19 DIAGNOSIS — R87613 High grade squamous intraepithelial lesion on cytologic smear of cervix (HGSIL): Secondary | ICD-10-CM | POA: Diagnosis not present

## 2017-01-19 DIAGNOSIS — Z8759 Personal history of other complications of pregnancy, childbirth and the puerperium: Secondary | ICD-10-CM | POA: Diagnosis not present

## 2017-01-19 DIAGNOSIS — O34219 Maternal care for unspecified type scar from previous cesarean delivery: Secondary | ICD-10-CM | POA: Diagnosis not present

## 2017-01-19 DIAGNOSIS — O09292 Supervision of pregnancy with other poor reproductive or obstetric history, second trimester: Secondary | ICD-10-CM | POA: Diagnosis not present

## 2017-01-19 DIAGNOSIS — O0990 Supervision of high risk pregnancy, unspecified, unspecified trimester: Secondary | ICD-10-CM

## 2017-01-19 DIAGNOSIS — O0992 Supervision of high risk pregnancy, unspecified, second trimester: Secondary | ICD-10-CM

## 2017-01-19 MED ORDER — PANTOPRAZOLE SODIUM 40 MG PO TBEC: 40.0000 mg | DELAYED_RELEASE_TABLET | Freq: Every day | ORAL | 6 refills | Status: DC

## 2017-01-19 MED ORDER — PROMETHAZINE HCL 25 MG PO TABS: 12.5000 mg | ORAL_TABLET | Freq: Four times a day (QID) | ORAL | 0 refills | Status: DC | PRN

## 2017-01-19 NOTE — Progress Notes (Signed)
   PRENATAL VISIT NOTE  Subjective:  Terri Estes is a 31 y.o. [phone] at [redacted]w[redacted]d being seen today for ongoing prenatal care.  She is currently monitored for the following issues for this high-risk pregnancy and has Supervision of high risk pregnancy, antepartum; History of cesarean delivery; Hx of preeclampsia, prior pregnancy, currently pregnant; H/O eclampsia; Trichomoniasis; BV (bacterial vaginosis); HSIL (high grade squamous intraepithelial lesion) on Pap smear of cervix; and Illicit drug use on her problem list.  Patient reports heartburn, nausea, vomiting and spitting.  Contractions: Not present. Vag. Bleeding: None.   . Denies leaking of fluid.   The following portions of the patient's history were reviewed and updated as appropriate: allergies, current medications, past family history, past medical history, past social history, past surgical history and problem list. Problem list updated.  Objective:   Vitals:   01/19/17 0919  BP: 106/72  Pulse: 81  Temp: 98.4 F (36.9 C)  Weight: 168 lb (76.2 kg)    Fetal Status: Fetal Heart Rate (bpm): 147         General:  Alert, oriented and cooperative. Patient is in no acute distress.  Skin: Skin is warm and dry. No rash noted.   Cardiovascular: Normal heart rate noted  Respiratory: Normal respiratory effort, no problems with respiration noted  Abdomen: Soft, gravid, appropriate for gestational age. Pain/Pressure: Absent     Pelvic:  Cervical exam deferred        Extremities: Normal range of motion.  Edema: None  Mental Status: Normal mood and affect. Normal behavior. Normal judgment and thought content.   Assessment and Plan:  Pregnancy: [id number] at [redacted]w[redacted]d  1. Supervision of high risk pregnancy, antepartum Start ASA soon if nausea improves  2. H/O eclampsia Nl BP today  Preterm labor symptoms and general obstetric precautions including but not limited to vaginal bleeding, contractions, leaking of fluid and fetal movement were  reviewed in detail with the patient. Please refer to After Visit Summary for other counseling recommendations.  Return in about 4 weeks (around 02/16/2017).   Woodroe Mode, MD

## 2017-01-19 NOTE — Progress Notes (Signed)
Patient ID: Terri Estes, female   DOB: Jul 11, 1986, 31 y.o.   MRN: [id number]  Chief Complaint  Patient presents with  . Routine Prenatal Visit    Patient is in the office for ob visit and colpo.    HPI Terri Estes is a 31 y.o. female.  [id number]  HPI  Indications: Pap smear on December 2017 showed: high-grade squamous intraepithelial neoplasia  (HGSIL-encompassing moderate and severe dysplasia). Previous colposcopy: no. Prior cervical treatment: no treatment.  Past Medical History:  Diagnosis Date  . Chronic pain   . Pregnancy induced hypertension     Past Surgical History:  Procedure Laterality Date  . CESAREAN SECTION      Family History  Problem Relation Age of Onset  . Kidney disease Maternal Grandmother   . Kidney disease Maternal Grandfather   . Kidney disease Paternal Grandmother   . Diabetes Paternal Grandmother   . Kidney disease Paternal Grandfather     Social History Social History  Substance Use Topics  . Smoking status: Former Smoker    Types: Cigarettes  . Smokeless tobacco: Never Used  . Alcohol use No    No Known Allergies  Current Outpatient Prescriptions  Medication Sig Dispense Refill  . calcium carbonate (TUMS - DOSED IN MG ELEMENTAL CALCIUM) 500 MG chewable tablet Chew 2 tablets by mouth daily as needed for indigestion or heartburn.    . Doxylamine-Pyridoxine (DICLEGIS) 10-10 MG TBEC Take 1 tablet with breakfast and lunch.  Take 2 tablets at bedtime. 100 tablet 4  . metoCLOPramide (REGLAN) 10 MG tablet Take 1 tablet (10 mg total) by mouth 3 (three) times daily before meals. (Patient not taking: Reported on 12/16/2016) 90 tablet 1  . metroNIDAZOLE (FLAGYL) 500 MG tablet Take 1 tablet (500 mg total) by mouth 2 (two) times daily. 14 tablet 0  . metroNIDAZOLE (METROGEL VAGINAL) 0.75 % vaginal gel Place 1 Applicatorful vaginally 2 (two) times daily. 70 g 0  . pantoprazole (PROTONIX) 40 MG tablet Take 1 tablet (40 mg total) by mouth daily.  30 tablet 6  . promethazine (PHENERGAN) 25 MG tablet Take 0.5-1 tablets (12.5-25 mg total) by mouth every 6 (six) hours as needed. 30 tablet 0  . ranitidine (ZANTAC) 150 MG tablet Take 1 tablet (150 mg total) by mouth 2 (two) times daily. 60 tablet 0  . terconazole (TERAZOL 3) 0.8 % vaginal cream Place 1 applicator vaginally at bedtime. 20 g 0   No current facility-administered medications for this visit.     Review of Systems Review of Systems  Blood pressure 106/72, pulse 81, temperature 98.4 F (36.9 C), weight 168 lb (76.2 kg), last menstrual period 10/07/2016.  Physical Exam Physical Exam  Data Reviewed Pap resullt  Assessment    Procedure Details  The risks and benefits of the procedure and Written informed consent obtained.  Speculum placed in vagina and excellent visualization of cervix achieved, cervix swabbed x 3 with acetic acid solution. 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     Plan    Repeat colpo postpartum .      Emeterio Reeve 01/19/2017, 10:16 AM

## 2017-01-19 NOTE — Progress Notes (Signed)
Patient is in the office for colpo and ob visit. Patient states she has been having dizzy spells that interfere with standing and that she was not able to complete Flagyl due to it making her sick.

## 2017-02-11 ENCOUNTER — Ambulatory Visit (HOSPITAL_COMMUNITY)
Admission: RE | Admit: 2017-02-11 | Discharge: 2017-02-11 | Disposition: A | Discharge status: Home / Self Care | Payer: Medicaid Other | Source: Ambulatory Visit | Attending: Certified Nurse Midwife | Admitting: Certified Nurse Midwife

## 2017-02-11 ENCOUNTER — Other Ambulatory Visit: Payer: Self-pay | Admitting: Certified Nurse Midwife

## 2017-02-11 ENCOUNTER — Other Ambulatory Visit (HOSPITAL_COMMUNITY): Payer: Self-pay | Admitting: *Deleted

## 2017-02-11 ENCOUNTER — Encounter (HOSPITAL_COMMUNITY): Payer: Self-pay

## 2017-02-11 DIAGNOSIS — O0990 Supervision of high risk pregnancy, unspecified, unspecified trimester: Secondary | ICD-10-CM

## 2017-02-11 DIAGNOSIS — O34211 Maternal care for low transverse scar from previous cesarean delivery: Secondary | ICD-10-CM | POA: Diagnosis not present

## 2017-02-11 DIAGNOSIS — O09219 Supervision of pregnancy with history of pre-term labor, unspecified trimester: Secondary | ICD-10-CM

## 2017-02-11 DIAGNOSIS — Z363 Encounter for antenatal screening for malformations: Secondary | ICD-10-CM | POA: Diagnosis present

## 2017-02-11 DIAGNOSIS — Z98891 History of uterine scar from previous surgery: Secondary | ICD-10-CM

## 2017-02-11 DIAGNOSIS — F1910 Other psychoactive substance abuse, uncomplicated: Secondary | ICD-10-CM | POA: Insufficient documentation

## 2017-02-11 DIAGNOSIS — Z3A18 18 weeks gestation of pregnancy: Secondary | ICD-10-CM | POA: Diagnosis not present

## 2017-02-11 DIAGNOSIS — O99322 Drug use complicating pregnancy, second trimester: Secondary | ICD-10-CM | POA: Diagnosis not present

## 2017-02-11 DIAGNOSIS — F1911 Other psychoactive substance abuse, in remission: Secondary | ICD-10-CM

## 2017-02-11 DIAGNOSIS — O09212 Supervision of pregnancy with history of pre-term labor, second trimester: Secondary | ICD-10-CM | POA: Diagnosis not present

## 2017-02-11 DIAGNOSIS — O09299 Supervision of pregnancy with other poor reproductive or obstetric history, unspecified trimester: Secondary | ICD-10-CM

## 2017-02-11 NOTE — Consult Note (Signed)
Maternal Fetal Medicine Consultation  Requesting Provider(s): Terri Estes, CNM  Reason for consultation: Hx of 32 week delivery due to preeclampsia / HELLP syndrome  HPI: Terri Estes is a 31 yo G5P0131, EDD 07/14/2017 who is currently at [place] 1d seen for consultation due to a history of a previous 32 week C-section complicated by preeclampsia.  The patient was taken to [facility] in 2009 for delivery.  She was initially transferred due to blood pressures in the 180's/100's - noted to have elevated LFTs / HELLP syndrome and underwent a C-section under general anesthesia (documented lower transverse incision in medical records).  While there is some question of an eclamptic seizure by the patient's history, this is not documented in the medical records from [facility].  Terri Estes also has a history of drug abuse- she reports a history of opioid use (denies IV drug abuse) and cocaine use.  She has a history of Anxiety / bipolar disease / depression and is followed by psychiatry.  She is without complaints today.  OB History: OB History    Gravida Para Term Preterm AB Living   5 1 0 1 3 1    SAB TAB Ectopic Multiple Live Births     3     1      PMH:  Past Medical History:  Diagnosis Date  . Chronic pain   . Pregnancy induced hypertension     PSH:  Past Surgical History:  Procedure Laterality Date  . CESAREAN SECTION     Meds:  Current Outpatient Prescriptions on File Prior to Encounter  Medication Sig Dispense Refill  . Buprenorphine HCl (SUBUTEX SL) Place under the tongue.    . calcium carbonate (TUMS - DOSED IN MG ELEMENTAL CALCIUM) 500 MG chewable tablet Chew 2 tablets by mouth daily as needed for indigestion or heartburn.    . Doxylamine-Pyridoxine (DICLEGIS) 10-10 MG TBEC Take 1 tablet with breakfast and lunch.  Take 2 tablets at bedtime. (Patient not taking: Reported on 02/11/2017) 100 tablet 4  . Lurasidone HCl (LATUDA PO) Take by mouth.    . metoCLOPramide (REGLAN) 10 MG tablet  Take 1 tablet (10 mg total) by mouth 3 (three) times daily before meals. (Patient not taking: Reported on 12/16/2016) 90 tablet 1  . metroNIDAZOLE (FLAGYL) 500 MG tablet Take 1 tablet (500 mg total) by mouth 2 (two) times daily. (Patient not taking: Reported on 02/11/2017) 14 tablet 0  . metroNIDAZOLE (METROGEL VAGINAL) 0.75 % vaginal gel Place 1 Applicatorful vaginally 2 (two) times daily. (Patient not taking: Reported on 02/11/2017) 70 g 0  . pantoprazole (PROTONIX) 40 MG tablet Take 1 tablet (40 mg total) by mouth daily. 30 tablet 6  . Prenatal Vit-Fe Fumarate-FA (PRENATAL VITAMIN PO) Take by mouth.    . promethazine (PHENERGAN) 25 MG tablet Take 0.5-1 tablets (12.5-25 mg total) by mouth every 6 (six) hours as needed. 30 tablet 0  . ranitidine (ZANTAC) 150 MG tablet Take 1 tablet (150 mg total) by mouth 2 (two) times daily. (Patient not taking: Reported on 02/11/2017) 60 tablet 0  . Sertraline HCl (ZOLOFT PO) Take by mouth.    . terconazole (TERAZOL 3) 0.8 % vaginal cream Place 1 applicator vaginally at bedtime. (Patient not taking: Reported on 02/11/2017) 20 g 0   No current facility-administered medications on file prior to encounter.    Allergies: No Known Allergies   FH:  Family History  Problem Relation Age of Onset  . Kidney disease Maternal Grandmother   . Kidney disease  Maternal Grandfather   . Kidney disease Paternal Grandmother   . Diabetes Paternal Grandmother   . Kidney disease Paternal Grandfather    Soc:  Social History   Social History  . Marital status: Single    Spouse name: N/A  . Number of children: N/A  . Years of education: N/A   Occupational History  . Not on file.   Social History Main Topics  . Smoking status: Former Smoker    Types: Cigarettes  . Smokeless tobacco: Never Used  . Alcohol use No  . Drug use: Yes    Types: Marijuana, Cocaine     Comment: subutex  . Sexual activity: Yes    Birth control/ protection: None   Other Topics Concern  . Not  on file   Social History Narrative  . No narrative on file    Review of Systems: no vaginal bleeding or cramping/contractions, no LOF, no nausea/vomiting. All other systems reviewed and are negative.  PE:  178.4 lbs, 105/69, 78  GEN: well-appearing female ABD: gravid, NT  Ultrasound: Single IUP at 18w 1d Hx of eclampsia, 32 week delivery An echogenic intracardiac focus was noted in the left ventricle (see comments) The remainder of the fetal anatomy appears normal Ultrasound measurements are consistent with dates Posterior fundal placenta Normal amniotic fluid volume  A/P: 1) Single IUP at 18w 1d  2) History of Preeclampsia / HELLP syndrome with 32 week delivery - the patient reports that her blood pressures were high and that she required a C-section at 32 weeks.    Recommendations  - The patient was encourage to take daily baby Aspirin  - Baseline preeclampsia labs; if not yet done, please check a 24-hr urine protein and creatinine clearance  - consider antiphospholipid antibody syndrome work up (anticardiolipin, lupus anticoagulant, anti-beta2 microglobulin)  - Recommend serial ultrasounds for growth   - Antenatal testing beginning no later than [redacted] weeks gestation   3) Hx of Opiate use (subutex), Cocaine - recommend serial ultrasounds.  Briefly counseled about potential risks to the fetus.  Consider NICU consultation later in pregnancy for risk of neonatal withdrawal.  4) Tobacco use - counseled regarding risks - serial ultrasounds  4) Bipolar / Anxiety / depression - continued follow up with Mental Health provider    Thank you for the opportunity to be a part of the care of Terri Estes. Please contact our office if we can be of further assistance.   I spent approximately 30 minutes with this patient with over 50% of time spent in face-to-face counseling.  Benjaman Lobe, MD Maternal Fetal Medicine

## 2017-02-15 ENCOUNTER — Other Ambulatory Visit: Payer: Self-pay | Admitting: Certified Nurse Midwife

## 2017-02-15 DIAGNOSIS — Z98891 History of uterine scar from previous surgery: Secondary | ICD-10-CM

## 2017-02-15 DIAGNOSIS — O0990 Supervision of high risk pregnancy, unspecified, unspecified trimester: Secondary | ICD-10-CM

## 2017-02-16 ENCOUNTER — Encounter: Payer: Self-pay | Admitting: *Deleted

## 2017-02-16 ENCOUNTER — Ambulatory Visit (INDEPENDENT_AMBULATORY_CARE_PROVIDER_SITE_OTHER): Payer: Medicaid Other | Admitting: Certified Nurse Midwife

## 2017-02-16 ENCOUNTER — Other Ambulatory Visit (HOSPITAL_COMMUNITY)
Admission: RE | Admit: 2017-02-16 | Discharge: 2017-02-16 | Disposition: A | Discharge status: Home / Self Care | Payer: Medicaid Other | Source: Ambulatory Visit | Attending: Certified Nurse Midwife | Admitting: Certified Nurse Midwife

## 2017-02-16 VITALS — BP 108/71 | HR 73 | Wt 179.0 lb

## 2017-02-16 DIAGNOSIS — R87613 High grade squamous intraepithelial lesion on cytologic smear of cervix (HGSIL): Secondary | ICD-10-CM

## 2017-02-16 DIAGNOSIS — O34219 Maternal care for unspecified type scar from previous cesarean delivery: Secondary | ICD-10-CM | POA: Diagnosis not present

## 2017-02-16 DIAGNOSIS — Z8759 Personal history of other complications of pregnancy, childbirth and the puerperium: Secondary | ICD-10-CM

## 2017-02-16 DIAGNOSIS — A5901 Trichomonal vulvovaginitis: Secondary | ICD-10-CM | POA: Diagnosis not present

## 2017-02-16 DIAGNOSIS — O09292 Supervision of pregnancy with other poor reproductive or obstetric history, second trimester: Secondary | ICD-10-CM | POA: Diagnosis not present

## 2017-02-16 DIAGNOSIS — A599 Trichomoniasis, unspecified: Secondary | ICD-10-CM

## 2017-02-16 DIAGNOSIS — Z113 Encounter for screening for infections with a predominantly sexual mode of transmission: Secondary | ICD-10-CM | POA: Diagnosis not present

## 2017-02-16 DIAGNOSIS — O98312 Other infections with a predominantly sexual mode of transmission complicating pregnancy, second trimester: Secondary | ICD-10-CM | POA: Diagnosis not present

## 2017-02-16 DIAGNOSIS — Z98891 History of uterine scar from previous surgery: Secondary | ICD-10-CM

## 2017-02-16 DIAGNOSIS — O0990 Supervision of high risk pregnancy, unspecified, unspecified trimester: Secondary | ICD-10-CM

## 2017-02-16 DIAGNOSIS — O09299 Supervision of pregnancy with other poor reproductive or obstetric history, unspecified trimester: Secondary | ICD-10-CM

## 2017-02-16 NOTE — Progress Notes (Signed)
   PRENATAL VISIT NOTE  Subjective:  Terri Estes is a 31 y.o. [phone] at [redacted]w[redacted]d being seen today for ongoing prenatal care.  She is currently monitored for the following issues for this high-risk pregnancy and has Supervision of high risk pregnancy, antepartum; History of cesarean delivery; Hx of preeclampsia, prior pregnancy, currently pregnant; H/O eclampsia; HSIL (high grade squamous intraepithelial lesion) on Pap smear of cervix; and Illicit drug use on her problem list.  Patient reports no complaints.  Contractions: Not present. Vag. Bleeding: None.  Movement: Present. Denies leaking of fluid.   The following portions of the patient's history were reviewed and updated as appropriate: allergies, current medications, past family history, past medical history, past social history, past surgical history and problem list. Problem list updated.  Objective:   Vitals:   02/16/17 0930  BP: 108/71  Pulse: 73  Weight: 179 lb (81.2 kg)    Fetal Status: Fetal Heart Rate (bpm): 142   Movement: Present     General:  Alert, oriented and cooperative. Patient is in no acute distress.  Skin: Skin is warm and dry. No rash noted.   Cardiovascular: Normal heart rate noted  Respiratory: Normal respiratory effort, no problems with respiration noted  Abdomen: Soft, gravid, appropriate for gestational age. Pain/Pressure: Absent     Pelvic:  Cervical exam deferred        Extremities: Normal range of motion.  Edema: None  Mental Status: Normal mood and affect. Normal behavior. Normal judgment and thought content.   Assessment and Plan:  Pregnancy: [id number] at [redacted]w[redacted]d  1. Supervision of high risk pregnancy, antepartum      - AFP, Serum, Open Spina Bifida - Cervicovaginal ancillary only  2. HSIL (high grade squamous intraepithelial lesion) on Pap smear of cervix     Colposcopy postpartum  3. Hx of preeclampsia, prior pregnancy, currently pregnant      - Creatinine Clearance, Urine, 24 hour -  Protein, Urine, 24 hour  4. History of cesarean delivery     TOLAC?   OP note on chart from [place] 12/21/16  5. H/O eclampsia     Encouraged to take baby ASA daily.   6. Trichomoniasis     TOC today - Cervicovaginal ancillary only  Preterm labor symptoms and general obstetric precautions including but not limited to vaginal bleeding, contractions, leaking of fluid and fetal movement were reviewed in detail with the patient. Please refer to After Visit Summary for other counseling recommendations.  Return in about 4 weeks (around 03/16/2017) for [id number].   Morene Crocker, CNM

## 2017-02-17 LAB — CERVICOVAGINAL ANCILLARY ONLY
CHLAMYDIA, DNA PROBE: NEGATIVE
NEISSERIA GONORRHEA: NEGATIVE
TRICH (WINDOWPATH): NEGATIVE

## 2017-02-19 ENCOUNTER — Encounter (HOSPITAL_COMMUNITY): Payer: Self-pay | Admitting: *Deleted

## 2017-02-19 ENCOUNTER — Inpatient Hospital Stay (HOSPITAL_COMMUNITY)
Admission: AD | Admit: 2017-02-19 | Discharge: 2017-02-19 | Disposition: A | Discharge status: Home / Self Care | Payer: Medicaid Other | Source: Ambulatory Visit | Attending: Obstetrics and Gynecology | Admitting: Obstetrics and Gynecology

## 2017-02-19 DIAGNOSIS — Z3A19 19 weeks gestation of pregnancy: Secondary | ICD-10-CM | POA: Diagnosis not present

## 2017-02-19 DIAGNOSIS — O9989 Other specified diseases and conditions complicating pregnancy, childbirth and the puerperium: Secondary | ICD-10-CM

## 2017-02-19 DIAGNOSIS — O26892 Other specified pregnancy related conditions, second trimester: Secondary | ICD-10-CM | POA: Insufficient documentation

## 2017-02-19 DIAGNOSIS — O99332 Smoking (tobacco) complicating pregnancy, second trimester: Secondary | ICD-10-CM | POA: Diagnosis not present

## 2017-02-19 DIAGNOSIS — Z79899 Other long term (current) drug therapy: Secondary | ICD-10-CM | POA: Diagnosis not present

## 2017-02-19 DIAGNOSIS — G8929 Other chronic pain: Secondary | ICD-10-CM | POA: Insufficient documentation

## 2017-02-19 DIAGNOSIS — R102 Pelvic and perineal pain: Secondary | ICD-10-CM | POA: Insufficient documentation

## 2017-02-19 DIAGNOSIS — O99352 Diseases of the nervous system complicating pregnancy, second trimester: Secondary | ICD-10-CM | POA: Diagnosis not present

## 2017-02-19 DIAGNOSIS — Y92410 Unspecified street and highway as the place of occurrence of the external cause: Secondary | ICD-10-CM | POA: Diagnosis not present

## 2017-02-19 LAB — CREATININE CLEARANCE, URINE, 24 HOUR
CREAT CLEAR: 124 mL/min (ref 88–128)
CREATININE 24H UR: 1094 mg/(24.h) (ref 800–1800)
Creatinine, Ser: 0.61 mg/dL (ref 0.57–1.00)
Creatinine, Urine: 48.6 mg/dL
GFR calc Af Amer: 143 mL/min/{1.73_m2} (ref >59)
GFR calc non Af Amer: 124 mL/min/{1.73_m2} (ref >59)

## 2017-02-19 LAB — PROTEIN, URINE, 24 HOUR
PROTEIN UR: 5.1 mg/dL
Protein, 24H Urine: 115 mg/24 hr (ref 30–150)

## 2017-02-19 MED ORDER — IBUPROFEN 600 MG PO TABS
600.0000 mg | ORAL_TABLET | Freq: Once | ORAL | Status: AC
  Administered 2017-02-19: 600 mg via ORAL
  Filled 2017-02-19: qty 1
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
## 2017-02-23 ENCOUNTER — Other Ambulatory Visit: Payer: Self-pay | Admitting: Certified Nurse Midwife

## 2017-02-23 LAB — AFP, SERUM, OPEN SPINA BIFIDA
AFP MoM: 1.15
AFP Value: 54.6 ng/mL
GEST. AGE ON COLLECTION DATE: 18.9 wk
Maternal Age At EDD: 28.8 years
OSBR RISK 1 IN: 10000
TEST RESULTS AFP: NEGATIVE
Weight: 179 [lb_av]

## 2017-02-24 ENCOUNTER — Other Ambulatory Visit: Payer: Self-pay | Admitting: Certified Nurse Midwife

## 2017-02-24 DIAGNOSIS — O0990 Supervision of high risk pregnancy, unspecified, unspecified trimester: Secondary | ICD-10-CM

## 2017-03-09 ENCOUNTER — Other Ambulatory Visit: Payer: Self-pay | Admitting: Certified Nurse Midwife

## 2017-03-09 ENCOUNTER — Telehealth: Payer: Self-pay | Admitting: *Deleted

## 2017-03-09 MED ORDER — PANTOPRAZOLE SODIUM 40 MG PO TBEC: 40.0000 mg | DELAYED_RELEASE_TABLET | Freq: Every day | ORAL | 6 refills | Status: DC

## 2017-03-09 NOTE — Telephone Encounter (Signed)
She needs a blood pressure check given her hx of Preeclampsia as a nurse visit.  Most likely swelling is due to medications and intake of salt.  Needs less than 2 mg of salt intake per day.  Not a candidate for 17-P last delivery was for preeclampsia not preterm labor.  Thank you. R.Genetta Fiero CNM

## 2017-03-09 NOTE — Telephone Encounter (Signed)
Patient notified of Rachelle's response. She does need an appointment on Nurse schedule- call forwarded to front staff to schedule. Patient will expect a call.

## 2017-03-09 NOTE — Telephone Encounter (Signed)
Patient is calling with several concerns: She wants to change her pharmacy to [facility] and have her reflux medication sent there. She is having blurred vision- trouble focusing first thing in the morning when she wakes up it takes her a few minutes for her eyes to adjust and focus. She is having a lot of finger tip numbness. She states it happens when she is laying down or when she is holding them up doing chores. She also thinks she has some swelling in her hands and they feel warm at times. She is having hot flashes- mostly at night. (She get nausea with them.She thinks this may be related to her not taking her reflux medication.) She also wants to know if she needs to be on 17P- she saw it on her paperwork- but has never taken it. In review- I do not see it ordered or even discussed at MFM. Told her I would check. Pharmacy changed and medication has been forwarded. Other concerns to be addressed by provider.

## 2017-03-11 ENCOUNTER — Ambulatory Visit (HOSPITAL_COMMUNITY)
Admission: RE | Admit: 2017-03-11 | Discharge: 2017-03-11 | Disposition: A | Discharge status: Home / Self Care | Payer: Medicaid Other | Source: Ambulatory Visit | Attending: Certified Nurse Midwife | Admitting: Certified Nurse Midwife

## 2017-03-11 ENCOUNTER — Encounter (HOSPITAL_COMMUNITY): Payer: Self-pay

## 2017-03-11 DIAGNOSIS — O09292 Supervision of pregnancy with other poor reproductive or obstetric history, second trimester: Secondary | ICD-10-CM | POA: Diagnosis present

## 2017-03-11 DIAGNOSIS — O09299 Supervision of pregnancy with other poor reproductive or obstetric history, unspecified trimester: Secondary | ICD-10-CM

## 2017-03-11 DIAGNOSIS — Z3A22 22 weeks gestation of pregnancy: Secondary | ICD-10-CM | POA: Diagnosis not present

## 2017-03-16 ENCOUNTER — Encounter: Payer: Medicaid Other | Admitting: Obstetrics and Gynecology

## 2017-03-29 ENCOUNTER — Encounter: Payer: Self-pay | Admitting: Obstetrics

## 2017-03-29 ENCOUNTER — Ambulatory Visit (INDEPENDENT_AMBULATORY_CARE_PROVIDER_SITE_OTHER): Payer: Medicaid Other | Admitting: Obstetrics and Gynecology

## 2017-03-29 DIAGNOSIS — O34219 Maternal care for unspecified type scar from previous cesarean delivery: Secondary | ICD-10-CM

## 2017-03-29 DIAGNOSIS — O0990 Supervision of high risk pregnancy, unspecified, unspecified trimester: Secondary | ICD-10-CM

## 2017-03-29 DIAGNOSIS — O09292 Supervision of pregnancy with other poor reproductive or obstetric history, second trimester: Secondary | ICD-10-CM

## 2017-03-29 NOTE — Progress Notes (Signed)
Prenatal Visit Note Date: 03/29/2017 Clinic: [facility]  Subjective:  Terri Estes is a 31 y.o. [phone] at [redacted]w[redacted]d being seen today for ongoing prenatal care.  She is currently monitored for the following issues for this high-risk pregnancy and has Supervision of high risk pregnancy, antepartum; History of cesarean delivery; Hx of preeclampsia, prior pregnancy, currently pregnant; History of HELLP syndrome, currently pregnant, second trimester; HSIL (high grade squamous intraepithelial lesion) on Pap smear of cervix; and Illicit drug use on her problem list.  Patient reports see RN notes  Contractions: Not present. Vag. Bleeding: None.  Movement: Present. Denies leaking of fluid.   The following portions of the patient's history were reviewed and updated as appropriate: allergies, current medications, past family history, past medical history, past social history, past surgical history and problem list. Problem list updated.  Objective:   Vitals:   03/29/17 1516  BP: 111/74  Pulse: 95  Weight: 187 lb 11.2 oz (85.1 kg)    Fetal Status: Fetal Heart Rate (bpm): 150   Movement: Present     General:  Alert, oriented and cooperative. Patient is in no acute distress.  Skin: Skin is warm and dry. No rash noted.   Cardiovascular: Normal heart rate noted  Respiratory: Normal respiratory effort, no problems with respiration noted  Abdomen: Soft, gravid, appropriate for gestational age. Pain/Pressure: Absent   Diffusely NTTP  Pelvic:  Cervical exam deferred        Extremities: Normal range of motion.  Edema: None  Mental Status: Normal mood and affect. Normal behavior. Normal judgment and thought content.   Urinalysis: Urine Protein: Trace Urine Glucose: Negative  Assessment and Plan:  Pregnancy: G5P0131 at [redacted]w[redacted]d  1. Supervision of high risk pregnancy, antepartum Routine care. Pt states discomfort comes and goes and under left breast. Likely musculosk. No GI s/s.  28wk labs nv. D/w pt that recommend qwk visits starting at nv (can alternate BP only and ROBs) given she had hellp at 29wks. Pt confirms she's on a baby asa.   Preterm labor symptoms and general obstetric precautions including but not limited to vaginal bleeding, contractions, leaking of fluid and fetal movement were reviewed in detail with the patient. Please refer to After Visit Summary for other counseling recommendations.  Return in about 2 weeks (around 04/12/2017).   Aletha Halim, MD
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
## 2017-04-08 ENCOUNTER — Encounter (HOSPITAL_COMMUNITY): Payer: Self-pay

## 2017-04-08 ENCOUNTER — Ambulatory Visit (HOSPITAL_COMMUNITY)
Admission: RE | Admit: 2017-04-08 | Discharge: 2017-04-08 | Disposition: A | Discharge status: Home / Self Care | Payer: Medicaid Other | Source: Ambulatory Visit | Attending: Certified Nurse Midwife | Admitting: Certified Nurse Midwife

## 2017-04-08 DIAGNOSIS — O09299 Supervision of pregnancy with other poor reproductive or obstetric history, unspecified trimester: Secondary | ICD-10-CM

## 2017-04-08 DIAGNOSIS — O99320 Drug use complicating pregnancy, unspecified trimester: Secondary | ICD-10-CM | POA: Diagnosis not present

## 2017-04-08 DIAGNOSIS — F1910 Other psychoactive substance abuse, uncomplicated: Secondary | ICD-10-CM | POA: Insufficient documentation

## 2017-04-08 DIAGNOSIS — Z3A26 26 weeks gestation of pregnancy: Secondary | ICD-10-CM | POA: Insufficient documentation

## 2017-04-08 DIAGNOSIS — O09292 Supervision of pregnancy with other poor reproductive or obstetric history, second trimester: Secondary | ICD-10-CM | POA: Insufficient documentation

## 2017-04-13 ENCOUNTER — Encounter: Payer: Self-pay | Admitting: Obstetrics

## 2017-04-13 ENCOUNTER — Encounter: Payer: Self-pay | Admitting: Obstetrics & Gynecology

## 2017-04-13 ENCOUNTER — Ambulatory Visit (INDEPENDENT_AMBULATORY_CARE_PROVIDER_SITE_OTHER): Payer: Medicaid Other | Admitting: Obstetrics & Gynecology

## 2017-04-13 ENCOUNTER — Other Ambulatory Visit: Payer: Medicaid Other

## 2017-04-13 VITALS — BP 111/72 | HR 85 | Wt 189.0 lb

## 2017-04-13 DIAGNOSIS — O0992 Supervision of high risk pregnancy, unspecified, second trimester: Secondary | ICD-10-CM | POA: Diagnosis not present

## 2017-04-13 DIAGNOSIS — O09292 Supervision of pregnancy with other poor reproductive or obstetric history, second trimester: Secondary | ICD-10-CM

## 2017-04-13 DIAGNOSIS — O09299 Supervision of pregnancy with other poor reproductive or obstetric history, unspecified trimester: Secondary | ICD-10-CM

## 2017-04-13 DIAGNOSIS — O0990 Supervision of high risk pregnancy, unspecified, unspecified trimester: Secondary | ICD-10-CM

## 2017-04-13 NOTE — Progress Notes (Signed)
Patient is in the office reports good fetal movement, and complains back pain and pelvic cramps, pt is unsure if it is contractions, denies bleeding.

## 2017-04-13 NOTE — Progress Notes (Signed)
Nl growth [place] 4/13   PRENATAL VISIT NOTE  Subjective: She fell 3 days ago and has back and low abd pain  Terri Estes is a 31 y.o. [phone] at [redacted]w[redacted]d being seen today for ongoing prenatal care.  She is currently monitored for the following issues for this high-risk pregnancy and has Supervision of high risk pregnancy, antepartum; History of cesarean delivery; Hx of preeclampsia, prior pregnancy, currently pregnant; History of HELLP syndrome, currently pregnant, second trimester; HSIL (high grade squamous intraepithelial lesion) on Pap smear of cervix; and Illicit drug use on her problem list.  Patient reports backache.  Contractions: Irritability. Vag. Bleeding: None.  Movement: Present. Denies leaking of fluid.   The following portions of the patient's history were reviewed and updated as appropriate: allergies, current medications, past family history, past medical history, past social history, past surgical history and problem list. Problem list updated.  Objective:   Vitals:   04/13/17 0846  BP: 111/72  Pulse: 85  Weight: 189 lb (85.7 kg)    Fetal Status: Fetal Heart Rate (bpm): 140   Movement: Present     General:  Alert, oriented and cooperative. Patient is in no acute distress.  Skin: Skin is warm and dry. No rash noted.   Cardiovascular: Normal heart rate noted  Respiratory: Normal respiratory effort, no problems with respiration noted  Abdomen: Soft, gravid, appropriate for gestational age. Pain/Pressure: Present     Pelvic:  Cervical exam deferred        Extremities: Normal range of motion.  Edema: None  Mental Status: Normal mood and affect. Normal behavior. Normal judgment and thought content.   Assessment and Plan:  Pregnancy: [id number] at [redacted]w[redacted]d  1. Hx of preeclampsia, prior pregnancy, currently pregnant Nl BP  2. Supervision of high risk pregnancy, antepartum Nl growth. MS pain  Preterm labor symptoms and general obstetric precautions including but not limited  to vaginal bleeding, contractions, leaking of fluid and fetal movement were reviewed in detail with the patient. Please refer to After Visit Summary for other counseling recommendations.  Return in about 2 weeks (around 04/27/2017).   Woodroe Mode, MD

## 2017-04-13 NOTE — Patient Instructions (Signed)
Third Trimester of Pregnancy The third trimester is from week 28 through week 40 (months 7 through 9). The third trimester is a time when the unborn baby (fetus) is growing rapidly. At the end of the ninth month, the fetus is about 20 inches in length and weighs 6-10 pounds. Body changes during your third trimester Your body will continue to go through many changes during pregnancy. The changes vary from woman to woman. During the third trimester:  Your weight will continue to increase. You can expect to gain 25-35 pounds (11-16 kg) by the end of the pregnancy.  You may begin to get stretch marks on your hips, abdomen, and breasts.  You may urinate more often because the fetus is moving lower into your pelvis and pressing on your bladder.  You may develop or continue to have heartburn. This is caused by increased hormones that slow down muscles in the digestive tract.  You may develop or continue to have constipation because increased hormones slow digestion and cause the muscles that push waste through your intestines to relax.  You may develop hemorrhoids. These are swollen veins (varicose veins) in the rectum that can itch or be painful.  You may develop swollen, bulging veins (varicose veins) in your legs.  You may have increased body aches in the pelvis, back, or thighs. This is due to weight gain and increased hormones that are relaxing your joints.  You may have changes in your hair. These can include thickening of your hair, rapid growth, and changes in texture. Some women also have hair loss during or after pregnancy, or hair that feels dry or thin. Your hair will most likely return to normal after your baby is born.  Your breasts will continue to grow and they will continue to become tender. A yellow fluid (colostrum) may leak from your breasts. This is the first milk you are producing for your baby.  Your belly button may stick out.  You may notice more swelling in your hands,  face, or ankles.  You may have increased tingling or numbness in your hands, arms, and legs. The skin on your belly may also feel numb.  You may feel short of breath because of your expanding uterus.  You may have more problems sleeping. This can be caused by the size of your belly, increased need to urinate, and an increase in your body's metabolism.  You may notice the fetus "dropping," or moving lower in your abdomen (lightening).  You may have increased vaginal discharge.  You may notice your joints feel loose and you may have pain around your pelvic bone.  What to expect at prenatal visits You will have prenatal exams every 2 weeks until week 36. Then you will have weekly prenatal exams. During a routine prenatal visit:  You will be weighed to make sure you and the baby are growing normally.  Your blood pressure will be taken.  Your abdomen will be measured to track your baby's growth.  The fetal heartbeat will be listened to.  Any test results from the previous visit will be discussed.  You may have a cervical check near your due date to see if your cervix has softened or thinned (effaced).  You will be tested for Group B streptococcus. This happens between 35 and 37 weeks.  Your health care provider may ask you:  What your birth plan is.  How you are feeling.  If you are feeling the baby move.  If you have had   any abnormal symptoms, such as leaking fluid, bleeding, severe headaches, or abdominal cramping.  If you are using any tobacco products, including cigarettes, chewing tobacco, and electronic cigarettes.  If you have any questions.  Other tests or screenings that may be performed during your third trimester include:  Blood tests that check for low iron levels (anemia).  Fetal testing to check the health, activity level, and growth of the fetus. Testing is done if you have certain medical conditions or if there are problems during the  pregnancy.  Nonstress test (NST). This test checks the health of your baby to make sure there are no signs of problems, such as the baby not getting enough oxygen. During this test, a belt is placed around your belly. The baby is made to move, and its heart rate is monitored during movement.  What is false labor? False labor is a condition in which you feel small, irregular tightenings of the muscles in the womb (contractions) that usually go away with rest, changing position, or drinking water. These are called Braxton Hicks contractions. Contractions may last for hours, days, or even weeks before true labor sets in. If contractions come at regular intervals, become more frequent, increase in intensity, or become painful, you should see your health care provider. What are the signs of labor?  Abdominal cramps.  Regular contractions that start at 10 minutes apart and become stronger and more frequent with time.  Contractions that start on the top of the uterus and spread down to the lower abdomen and back.  Increased pelvic pressure and dull back pain.  A watery or bloody mucus discharge that comes from the vagina.  Leaking of amniotic fluid. This is also known as your "water breaking." It could be a slow trickle or a gush. Let your health care provider know if it has a color or strange odor. If you have any of these signs, call your health care provider right away, even if it is before your due date. Follow these instructions at home: Medicines  Follow your health care provider's instructions regarding medicine use. Specific medicines may be either safe or unsafe to take during pregnancy.  Take a prenatal vitamin that contains at least 600 micrograms (mcg) of folic acid.  If you develop constipation, try taking a stool softener if your health care provider approves. Eating and drinking  Eat a balanced diet that includes fresh fruits and vegetables, whole grains, good sources of protein  such as meat, eggs, or tofu, and low-fat dairy. Your health care provider will help you determine the amount of weight gain that is right for you.  Avoid raw meat and uncooked cheese. These carry germs that can cause birth defects in the baby.  If you have low calcium intake from food, talk to your health care provider about whether you should take a daily calcium supplement.  Eat four or five small meals rather than three large meals a day.  Limit foods that are high in fat and processed sugars, such as fried and sweet foods.  To prevent constipation: ? Drink enough fluid to keep your urine clear or pale yellow. ? Eat foods that are high in fiber, such as fresh fruits and vegetables, whole grains, and beans. Activity  Exercise only as directed by your health care provider. Most women can continue their usual exercise routine during pregnancy. Try to exercise for 30 minutes at least 5 days a week. Stop exercising if you experience uterine contractions.  Avoid heavy   lifting.  Do not exercise in extreme heat or humidity, or at high altitudes.  Wear low-heel, comfortable shoes.  Practice good posture.  You may continue to have sex unless your health care provider tells you otherwise. Relieving pain and discomfort  Take frequent breaks and rest with your legs elevated if you have leg cramps or low back pain.  Take warm sitz baths to soothe any pain or discomfort caused by hemorrhoids. Use hemorrhoid cream if your health care provider approves.  Wear a good support bra to prevent discomfort from breast tenderness.  If you develop varicose veins: ? Wear support pantyhose or compression stockings as told by your healthcare provider. ? Elevate your feet for 15 minutes, 3-4 times a day. Prenatal care  Write down your questions. Take them to your prenatal visits.  Keep all your prenatal visits as told by your health care provider. This is important. Safety  Wear your seat belt at  all times when driving.  Make a list of emergency phone numbers, including numbers for family, friends, the hospital, and police and fire departments. General instructions  Avoid cat litter boxes and soil used by cats. These carry germs that can cause birth defects in the baby. If you have a cat, ask someone to clean the litter box for you.  Do not travel far distances unless it is absolutely necessary and only with the approval of your health care provider.  Do not use hot tubs, steam rooms, or saunas.  Do not drink alcohol.  Do not use any products that contain nicotine or tobacco, such as cigarettes and e-cigarettes. If you need help quitting, ask your health care provider.  Do not use any medicinal herbs or unprescribed drugs. These chemicals affect the formation and growth of the baby.  Do not douche or use tampons or scented sanitary pads.  Do not cross your legs for long periods of time.  To prepare for the arrival of your baby: ? Take prenatal classes to understand, practice, and ask questions about labor and delivery. ? Make a trial run to the hospital. ? Visit the hospital and tour the maternity area. ? Arrange for maternity or paternity leave through employers. ? Arrange for family and friends to take care of pets while you are in the hospital. ? Purchase a rear-facing car seat and make sure you know how to install it in your car. ? Pack your hospital bag. ? Prepare the baby's nursery. Make sure to remove all pillows and stuffed animals from the baby's crib to prevent suffocation.  Visit your dentist if you have not gone during your pregnancy. Use a soft toothbrush to brush your teeth and be gentle when you floss. Contact a health care provider if:  You are unsure if you are in labor or if your water has broken.  You become dizzy.  You have mild pelvic cramps, pelvic pressure, or nagging pain in your abdominal area.  You have lower back pain.  You have persistent  nausea, vomiting, or diarrhea.  You have an unusual or bad smelling vaginal discharge.  You have pain when you urinate. Get help right away if:  Your water breaks before 37 weeks.  You have regular contractions less than 5 minutes apart before 37 weeks.  You have a fever.  You are leaking fluid from your vagina.  You have spotting or bleeding from your vagina.  You have severe abdominal pain or cramping.  You have rapid weight loss or weight gain.    You have shortness of breath with chest pain.  You notice sudden or extreme swelling of your face, hands, ankles, feet, or legs.  Your baby makes fewer than 10 movements in 2 hours.  You have severe headaches that do not go away when you take medicine.  You have vision changes. Summary  The third trimester is from week 28 through week 40, months 7 through 9. The third trimester is a time when the unborn baby (fetus) is growing rapidly.  During the third trimester, your discomfort may increase as you and your baby continue to gain weight. You may have abdominal, leg, and back pain, sleeping problems, and an increased need to urinate.  During the third trimester your breasts will keep growing and they will continue to become tender. A yellow fluid (colostrum) may leak from your breasts. This is the first milk you are producing for your baby.  False labor is a condition in which you feel small, irregular tightenings of the muscles in the womb (contractions) that eventually go away. These are called Braxton Hicks contractions. Contractions may last for hours, days, or even weeks before true labor sets in.  Signs of labor can include: abdominal cramps; regular contractions that start at 10 minutes apart and become stronger and more frequent with time; watery or bloody mucus discharge that comes from the vagina; increased pelvic pressure and dull back pain; and leaking of amniotic fluid. This information is not intended to replace advice  given to you by your health care provider. Make sure you discuss any questions you have with your health care provider. Document Released: 12/07/2001 Document Revised: 05/20/2016 Document Reviewed: 02/13/2013 Elsevier Interactive Patient Education  2017 [employer]  

## 2017-04-14 LAB — GLUCOSE TOLERANCE, 2 HOURS W/ 1HR
GLUCOSE, 1 HOUR: 88 mg/dL (ref 65–179)
GLUCOSE, 2 HOUR: 82 mg/dL (ref 65–152)
GLUCOSE, FASTING: 74 mg/dL (ref 65–91)

## 2017-04-14 LAB — HIV ANTIBODY (ROUTINE TESTING W REFLEX): HIV Screen 4th Generation wRfx: NONREACTIVE

## 2017-04-14 LAB — CBC
Hematocrit: 30.7 % — ABNORMAL LOW (ref 34.0–46.6)
Hemoglobin: 10 g/dL — ABNORMAL LOW (ref 11.1–15.9)
MCH: 31.3 pg (ref 26.6–33.0)
MCHC: 32.6 g/dL (ref 31.5–35.7)
MCV: 96 fL (ref 79–97)
Platelets: 228 10*3/uL (ref 150–379)
RBC: 3.19 x10E6/uL — AB (ref 3.77–5.28)
RDW: 14 % (ref 12.3–15.4)
WBC: 8.3 10*3/uL (ref 3.4–10.8)

## 2017-04-14 LAB — RPR: RPR: NONREACTIVE

## 2017-04-27 ENCOUNTER — Encounter: Payer: Medicaid Other | Admitting: Obstetrics and Gynecology

## 2017-05-02 ENCOUNTER — Encounter: Payer: Medicaid Other | Admitting: Obstetrics

## 2017-05-06 ENCOUNTER — Ambulatory Visit (HOSPITAL_COMMUNITY): Payer: No Typology Code available for payment source

## 2017-05-11 ENCOUNTER — Encounter: Payer: Medicaid Other | Admitting: Obstetrics

## 2017-05-16 ENCOUNTER — Ambulatory Visit (INDEPENDENT_AMBULATORY_CARE_PROVIDER_SITE_OTHER): Payer: Medicaid Other | Admitting: Obstetrics and Gynecology

## 2017-05-16 ENCOUNTER — Encounter: Payer: Self-pay | Admitting: Obstetrics

## 2017-05-16 VITALS — BP 116/79 | HR 86 | Wt 193.2 lb

## 2017-05-16 DIAGNOSIS — O09299 Supervision of pregnancy with other poor reproductive or obstetric history, unspecified trimester: Secondary | ICD-10-CM

## 2017-05-16 DIAGNOSIS — F1990 Other psychoactive substance use, unspecified, uncomplicated: Secondary | ICD-10-CM

## 2017-05-16 DIAGNOSIS — Z98891 History of uterine scar from previous surgery: Secondary | ICD-10-CM

## 2017-05-16 DIAGNOSIS — O0990 Supervision of high risk pregnancy, unspecified, unspecified trimester: Secondary | ICD-10-CM

## 2017-05-16 DIAGNOSIS — O09293 Supervision of pregnancy with other poor reproductive or obstetric history, third trimester: Secondary | ICD-10-CM

## 2017-05-16 DIAGNOSIS — O09292 Supervision of pregnancy with other poor reproductive or obstetric history, second trimester: Secondary | ICD-10-CM

## 2017-05-16 DIAGNOSIS — O0993 Supervision of high risk pregnancy, unspecified, third trimester: Secondary | ICD-10-CM

## 2017-05-16 MED ORDER — MISC. DEVICES MISC: 1.0000 [IU] | Freq: Every day | 0 refills | Status: DC

## 2017-05-16 NOTE — Progress Notes (Signed)
Patient reports good fetal movement and feeling occasional pressure and back pain, denies bleeding.

## 2017-05-16 NOTE — Progress Notes (Signed)
Subjective:  Terri Estes is a 31 y.o. [phone] at [redacted]w[redacted]d being seen today for ongoing prenatal care.  She is currently monitored for the following issues for this high-risk pregnancy and has Supervision of high risk pregnancy, antepartum; History of cesarean delivery; Hx of preeclampsia, prior pregnancy, currently pregnant; History of HELLP syndrome, currently pregnant, second trimester; HSIL (high grade squamous intraepithelial lesion) on Pap smear of cervix; and Illicit drug use on her problem list.  Patient reports no complaints.  Contractions: Irritability. Vag. Bleeding: None.  Movement: Present. Denies leaking of fluid.   The following portions of the patient's history were reviewed and updated as appropriate: allergies, current medications, past family history, past medical history, past social history, past surgical history and problem list. Problem list updated.  Objective:   Vitals:   05/16/17 1127  BP: 116/79  Pulse: 86  Weight: 193 lb 3.2 oz (87.6 kg)    Fetal Status: Fetal Heart Rate (bpm): 128   Movement: Present     General:  Alert, oriented and cooperative. Patient is in no acute distress.  Skin: Skin is warm and dry. No rash noted.   Cardiovascular: Normal heart rate noted  Respiratory: Normal respiratory effort, no problems with respiration noted  Abdomen: Soft, gravid, appropriate for gestational age. Pain/Pressure: Present     Pelvic:  Cervical exam deferred        Extremities: Normal range of motion.  Edema: Trace  Mental Status: Normal mood and affect. Normal behavior. Normal judgment and thought content.   Urinalysis:      Assessment and Plan:  Pregnancy: [id number] at [redacted]w[redacted]d  1. Supervision of high risk pregnancy, antepartum Some general discomforts of pregnancy.  - Misc. Devices MISC; 1 Units by Does not apply route daily. Maternity support belt as needed  Dispense: 1 each; Refill: 0  2. History of cesarean delivery TOLAC, papers signed 03/30/17  3. Hx of  preeclampsia, prior pregnancy, currently pregnant Pt not taking BASA. Discussed importance of BASA Will try coated BASA  4. History of HELLP syndrome, currently pregnant, second trimester As above  5. 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 Please refer to After Visit Summary for other counseling recommendations.  Return in about 2 weeks (around 05/30/2017) for OB visit.   Chancy Milroy, MD

## 2017-05-16 NOTE — Patient Instructions (Signed)
Third Trimester of Pregnancy The third trimester is from week 28 through week 40 (months 7 through 9). The third trimester is a time when the unborn baby (fetus) is growing rapidly. At the end of the ninth month, the fetus is about 20 inches in length and weighs 6-10 pounds. Body changes during your third trimester Your body will continue to go through many changes during pregnancy. The changes vary from woman to woman. During the third trimester:  Your weight will continue to increase. You can expect to gain 25-35 pounds (11-16 kg) by the end of the pregnancy.  You may begin to get stretch marks on your hips, abdomen, and breasts.  You may urinate more often because the fetus is moving lower into your pelvis and pressing on your bladder.  You may develop or continue to have heartburn. This is caused by increased hormones that slow down muscles in the digestive tract.  You may develop or continue to have constipation because increased hormones slow digestion and cause the muscles that push waste through your intestines to relax.  You may develop hemorrhoids. These are swollen veins (varicose veins) in the rectum that can itch or be painful.  You may develop swollen, bulging veins (varicose veins) in your legs.  You may have increased body aches in the pelvis, back, or thighs. This is due to weight gain and increased hormones that are relaxing your joints.  You may have changes in your hair. These can include thickening of your hair, rapid growth, and changes in texture. Some women also have hair loss during or after pregnancy, or hair that feels dry or thin. Your hair will most likely return to normal after your baby is born.  Your breasts will continue to grow and they will continue to become tender. A yellow fluid (colostrum) may leak from your breasts. This is the first milk you are producing for your baby.  Your belly button may stick out.  You may notice more swelling in your hands,  face, or ankles.  You may have increased tingling or numbness in your hands, arms, and legs. The skin on your belly may also feel numb.  You may feel short of breath because of your expanding uterus.  You may have more problems sleeping. This can be caused by the size of your belly, increased need to urinate, and an increase in your body's metabolism.  You may notice the fetus "dropping," or moving lower in your abdomen (lightening).  You may have increased vaginal discharge.  You may notice your joints feel loose and you may have pain around your pelvic bone.  What to expect at prenatal visits You will have prenatal exams every 2 weeks until week 36. Then you will have weekly prenatal exams. During a routine prenatal visit:  You will be weighed to make sure you and the baby are growing normally.  Your blood pressure will be taken.  Your abdomen will be measured to track your baby's growth.  The fetal heartbeat will be listened to.  Any test results from the previous visit will be discussed.  You may have a cervical check near your due date to see if your cervix has softened or thinned (effaced).  You will be tested for Group B streptococcus. This happens between 35 and 37 weeks.  Your health care provider may ask you:  What your birth plan is.  How you are feeling.  If you are feeling the baby move.  If you have had   any abnormal symptoms, such as leaking fluid, bleeding, severe headaches, or abdominal cramping.  If you are using any tobacco products, including cigarettes, chewing tobacco, and electronic cigarettes.  If you have any questions.  Other tests or screenings that may be performed during your third trimester include:  Blood tests that check for low iron levels (anemia).  Fetal testing to check the health, activity level, and growth of the fetus. Testing is done if you have certain medical conditions or if there are problems during the  pregnancy.  Nonstress test (NST). This test checks the health of your baby to make sure there are no signs of problems, such as the baby not getting enough oxygen. During this test, a belt is placed around your belly. The baby is made to move, and its heart rate is monitored during movement.  What is false labor? False labor is a condition in which you feel small, irregular tightenings of the muscles in the womb (contractions) that usually go away with rest, changing position, or drinking water. These are called Braxton Hicks contractions. Contractions may last for hours, days, or even weeks before true labor sets in. If contractions come at regular intervals, become more frequent, increase in intensity, or become painful, you should see your health care provider. What are the signs of labor?  Abdominal cramps.  Regular contractions that start at 10 minutes apart and become stronger and more frequent with time.  Contractions that start on the top of the uterus and spread down to the lower abdomen and back.  Increased pelvic pressure and dull back pain.  A watery or bloody mucus discharge that comes from the vagina.  Leaking of amniotic fluid. This is also known as your "water breaking." It could be a slow trickle or a gush. Let your health care provider know if it has a color or strange odor. If you have any of these signs, call your health care provider right away, even if it is before your due date. Follow these instructions at home: Medicines  Follow your health care provider's instructions regarding medicine use. Specific medicines may be either safe or unsafe to take during pregnancy.  Take a prenatal vitamin that contains at least 600 micrograms (mcg) of folic acid.  If you develop constipation, try taking a stool softener if your health care provider approves. Eating and drinking  Eat a balanced diet that includes fresh fruits and vegetables, whole grains, good sources of protein  such as meat, eggs, or tofu, and low-fat dairy. Your health care provider will help you determine the amount of weight gain that is right for you.  Avoid raw meat and uncooked cheese. These carry germs that can cause birth defects in the baby.  If you have low calcium intake from food, talk to your health care provider about whether you should take a daily calcium supplement.  Eat four or five small meals rather than three large meals a day.  Limit foods that are high in fat and processed sugars, such as fried and sweet foods.  To prevent constipation: ? Drink enough fluid to keep your urine clear or pale yellow. ? Eat foods that are high in fiber, such as fresh fruits and vegetables, whole grains, and beans. Activity  Exercise only as directed by your health care provider. Most women can continue their usual exercise routine during pregnancy. Try to exercise for 30 minutes at least 5 days a week. Stop exercising if you experience uterine contractions.  Avoid heavy   lifting.  Do not exercise in extreme heat or humidity, or at high altitudes.  Wear low-heel, comfortable shoes.  Practice good posture.  You may continue to have sex unless your health care provider tells you otherwise. Relieving pain and discomfort  Take frequent breaks and rest with your legs elevated if you have leg cramps or low back pain.  Take warm sitz baths to soothe any pain or discomfort caused by hemorrhoids. Use hemorrhoid cream if your health care provider approves.  Wear a good support bra to prevent discomfort from breast tenderness.  If you develop varicose veins: ? Wear support pantyhose or compression stockings as told by your healthcare provider. ? Elevate your feet for 15 minutes, 3-4 times a day. Prenatal care  Write down your questions. Take them to your prenatal visits.  Keep all your prenatal visits as told by your health care provider. This is important. Safety  Wear your seat belt at  all times when driving.  Make a list of emergency phone numbers, including numbers for family, friends, the hospital, and police and fire departments. General instructions  Avoid cat litter boxes and soil used by cats. These carry germs that can cause birth defects in the baby. If you have a cat, ask someone to clean the litter box for you.  Do not travel far distances unless it is absolutely necessary and only with the approval of your health care provider.  Do not use hot tubs, steam rooms, or saunas.  Do not drink alcohol.  Do not use any products that contain nicotine or tobacco, such as cigarettes and e-cigarettes. If you need help quitting, ask your health care provider.  Do not use any medicinal herbs or unprescribed drugs. These chemicals affect the formation and growth of the baby.  Do not douche or use tampons or scented sanitary pads.  Do not cross your legs for long periods of time.  To prepare for the arrival of your baby: ? Take prenatal classes to understand, practice, and ask questions about labor and delivery. ? Make a trial run to the hospital. ? Visit the hospital and tour the maternity area. ? Arrange for maternity or paternity leave through employers. ? Arrange for family and friends to take care of pets while you are in the hospital. ? Purchase a rear-facing car seat and make sure you know how to install it in your car. ? Pack your hospital bag. ? Prepare the baby's nursery. Make sure to remove all pillows and stuffed animals from the baby's crib to prevent suffocation.  Visit your dentist if you have not gone during your pregnancy. Use a soft toothbrush to brush your teeth and be gentle when you floss. Contact a health care provider if:  You are unsure if you are in labor or if your water has broken.  You become dizzy.  You have mild pelvic cramps, pelvic pressure, or nagging pain in your abdominal area.  You have lower back pain.  You have persistent  nausea, vomiting, or diarrhea.  You have an unusual or bad smelling vaginal discharge.  You have pain when you urinate. Get help right away if:  Your water breaks before 37 weeks.  You have regular contractions less than 5 minutes apart before 37 weeks.  You have a fever.  You are leaking fluid from your vagina.  You have spotting or bleeding from your vagina.  You have severe abdominal pain or cramping.  You have rapid weight loss or weight gain.    You have shortness of breath with chest pain.  You notice sudden or extreme swelling of your face, hands, ankles, feet, or legs.  Your baby makes fewer than 10 movements in 2 hours.  You have severe headaches that do not go away when you take medicine.  You have vision changes. Summary  The third trimester is from week 28 through week 40, months 7 through 9. The third trimester is a time when the unborn baby (fetus) is growing rapidly.  During the third trimester, your discomfort may increase as you and your baby continue to gain weight. You may have abdominal, leg, and back pain, sleeping problems, and an increased need to urinate.  During the third trimester your breasts will keep growing and they will continue to become tender. A yellow fluid (colostrum) may leak from your breasts. This is the first milk you are producing for your baby.  False labor is a condition in which you feel small, irregular tightenings of the muscles in the womb (contractions) that eventually go away. These are called Braxton Hicks contractions. Contractions may last for hours, days, or even weeks before true labor sets in.  Signs of labor can include: abdominal cramps; regular contractions that start at 10 minutes apart and become stronger and more frequent with time; watery or bloody mucus discharge that comes from the vagina; increased pelvic pressure and dull back pain; and leaking of amniotic fluid. This information is not intended to replace advice  given to you by your health care provider. Make sure you discuss any questions you have with your health care provider. Document Released: 12/07/2001 Document Revised: 05/20/2016 Document Reviewed: 02/13/2013 Elsevier Interactive Patient Education  2017 [employer]  

## 2017-05-19 ENCOUNTER — Ambulatory Visit (HOSPITAL_COMMUNITY)
Admission: RE | Admit: 2017-05-19 | Discharge: 2017-05-19 | Disposition: A | Discharge status: Home / Self Care | Payer: Medicaid Other | Source: Ambulatory Visit | Attending: Maternal and Fetal Medicine | Admitting: Maternal and Fetal Medicine

## 2017-05-19 ENCOUNTER — Encounter (HOSPITAL_COMMUNITY): Payer: Self-pay

## 2017-05-19 DIAGNOSIS — O34219 Maternal care for unspecified type scar from previous cesarean delivery: Secondary | ICD-10-CM | POA: Insufficient documentation

## 2017-05-19 DIAGNOSIS — Z8759 Personal history of other complications of pregnancy, childbirth and the puerperium: Secondary | ICD-10-CM | POA: Diagnosis not present

## 2017-05-19 DIAGNOSIS — Z3A32 32 weeks gestation of pregnancy: Secondary | ICD-10-CM | POA: Diagnosis not present

## 2017-05-19 DIAGNOSIS — O09293 Supervision of pregnancy with other poor reproductive or obstetric history, third trimester: Secondary | ICD-10-CM | POA: Diagnosis present

## 2017-05-19 DIAGNOSIS — O09299 Supervision of pregnancy with other poor reproductive or obstetric history, unspecified trimester: Secondary | ICD-10-CM

## 2017-05-20 ENCOUNTER — Other Ambulatory Visit (HOSPITAL_COMMUNITY): Payer: Self-pay | Admitting: *Deleted

## 2017-05-20 DIAGNOSIS — F1120 Opioid dependence, uncomplicated: Secondary | ICD-10-CM

## 2017-05-20 DIAGNOSIS — O99320 Drug use complicating pregnancy, unspecified trimester: Principal | ICD-10-CM

## 2017-05-31 ENCOUNTER — Encounter: Payer: Self-pay | Admitting: *Deleted

## 2017-06-01 ENCOUNTER — Ambulatory Visit (INDEPENDENT_AMBULATORY_CARE_PROVIDER_SITE_OTHER): Payer: Medicaid Other | Admitting: Obstetrics and Gynecology

## 2017-06-01 ENCOUNTER — Encounter: Payer: Self-pay | Admitting: Obstetrics

## 2017-06-01 VITALS — BP 130/82 | HR 85 | Wt 192.6 lb

## 2017-06-01 DIAGNOSIS — H00014 Hordeolum externum left upper eyelid: Secondary | ICD-10-CM

## 2017-06-01 DIAGNOSIS — F1990 Other psychoactive substance use, unspecified, uncomplicated: Secondary | ICD-10-CM

## 2017-06-01 DIAGNOSIS — O09299 Supervision of pregnancy with other poor reproductive or obstetric history, unspecified trimester: Secondary | ICD-10-CM

## 2017-06-01 DIAGNOSIS — O0993 Supervision of high risk pregnancy, unspecified, third trimester: Secondary | ICD-10-CM

## 2017-06-01 DIAGNOSIS — H00019 Hordeolum externum unspecified eye, unspecified eyelid: Secondary | ICD-10-CM | POA: Insufficient documentation

## 2017-06-01 DIAGNOSIS — O09292 Supervision of pregnancy with other poor reproductive or obstetric history, second trimester: Secondary | ICD-10-CM

## 2017-06-01 DIAGNOSIS — Z98891 History of uterine scar from previous surgery: Secondary | ICD-10-CM

## 2017-06-01 DIAGNOSIS — O09293 Supervision of pregnancy with other poor reproductive or obstetric history, third trimester: Secondary | ICD-10-CM

## 2017-06-01 DIAGNOSIS — O0990 Supervision of high risk pregnancy, unspecified, unspecified trimester: Secondary | ICD-10-CM

## 2017-06-01 MED ORDER — GENTAMICIN SULFATE 0.3 % OP SOLN
2.0000 [drp] | Freq: Three times a day (TID) | OPHTHALMIC | 1 refills | Status: DC
Start: 2017-06-01 — End: 2017-07-07

## 2017-06-01 NOTE — Patient Instructions (Signed)
Third Trimester of Pregnancy The third trimester is from week 28 through week 40 (months 7 through 9). The third trimester is a time when the unborn baby (fetus) is growing rapidly. At the end of the ninth month, the fetus is about 20 inches in length and weighs 6-10 pounds. Body changes during your third trimester Your body will continue to go through many changes during pregnancy. The changes vary from woman to woman. During the third trimester:  Your weight will continue to increase. You can expect to gain 25-35 pounds (11-16 kg) by the end of the pregnancy.  You may begin to get stretch marks on your hips, abdomen, and breasts.  You may urinate more often because the fetus is moving lower into your pelvis and pressing on your bladder.  You may develop or continue to have heartburn. This is caused by increased hormones that slow down muscles in the digestive tract.  You may develop or continue to have constipation because increased hormones slow digestion and cause the muscles that push waste through your intestines to relax.  You may develop hemorrhoids. These are swollen veins (varicose veins) in the rectum that can itch or be painful.  You may develop swollen, bulging veins (varicose veins) in your legs.  You may have increased body aches in the pelvis, back, or thighs. This is due to weight gain and increased hormones that are relaxing your joints.  You may have changes in your hair. These can include thickening of your hair, rapid growth, and changes in texture. Some women also have hair loss during or after pregnancy, or hair that feels dry or thin. Your hair will most likely return to normal after your baby is born.  Your breasts will continue to grow and they will continue to become tender. A yellow fluid (colostrum) may leak from your breasts. This is the first milk you are producing for your baby.  Your belly button may stick out.  You may notice more swelling in your hands,  face, or ankles.  You may have increased tingling or numbness in your hands, arms, and legs. The skin on your belly may also feel numb.  You may feel short of breath because of your expanding uterus.  You may have more problems sleeping. This can be caused by the size of your belly, increased need to urinate, and an increase in your body's metabolism.  You may notice the fetus "dropping," or moving lower in your abdomen (lightening).  You may have increased vaginal discharge.  You may notice your joints feel loose and you may have pain around your pelvic bone.  What to expect at prenatal visits You will have prenatal exams every 2 weeks until week 36. Then you will have weekly prenatal exams. During a routine prenatal visit:  You will be weighed to make sure you and the baby are growing normally.  Your blood pressure will be taken.  Your abdomen will be measured to track your baby's growth.  The fetal heartbeat will be listened to.  Any test results from the previous visit will be discussed.  You may have a cervical check near your due date to see if your cervix has softened or thinned (effaced).  You will be tested for Group B streptococcus. This happens between 35 and 37 weeks.  Your health care provider may ask you:  What your birth plan is.  How you are feeling.  If you are feeling the baby move.  If you have had   any abnormal symptoms, such as leaking fluid, bleeding, severe headaches, or abdominal cramping.  If you are using any tobacco products, including cigarettes, chewing tobacco, and electronic cigarettes.  If you have any questions.  Other tests or screenings that may be performed during your third trimester include:  Blood tests that check for low iron levels (anemia).  Fetal testing to check the health, activity level, and growth of the fetus. Testing is done if you have certain medical conditions or if there are problems during the  pregnancy.  Nonstress test (NST). This test checks the health of your baby to make sure there are no signs of problems, such as the baby not getting enough oxygen. During this test, a belt is placed around your belly. The baby is made to move, and its heart rate is monitored during movement.  What is false labor? False labor is a condition in which you feel small, irregular tightenings of the muscles in the womb (contractions) that usually go away with rest, changing position, or drinking water. These are called Braxton Hicks contractions. Contractions may last for hours, days, or even weeks before true labor sets in. If contractions come at regular intervals, become more frequent, increase in intensity, or become painful, you should see your health care provider. What are the signs of labor?  Abdominal cramps.  Regular contractions that start at 10 minutes apart and become stronger and more frequent with time.  Contractions that start on the top of the uterus and spread down to the lower abdomen and back.  Increased pelvic pressure and dull back pain.  A watery or bloody mucus discharge that comes from the vagina.  Leaking of amniotic fluid. This is also known as your "water breaking." It could be a slow trickle or a gush. Let your health care provider know if it has a color or strange odor. If you have any of these signs, call your health care provider right away, even if it is before your due date. Follow these instructions at home: Medicines  Follow your health care provider's instructions regarding medicine use. Specific medicines may be either safe or unsafe to take during pregnancy.  Take a prenatal vitamin that contains at least 600 micrograms (mcg) of folic acid.  If you develop constipation, try taking a stool softener if your health care provider approves. Eating and drinking  Eat a balanced diet that includes fresh fruits and vegetables, whole grains, good sources of protein  such as meat, eggs, or tofu, and low-fat dairy. Your health care provider will help you determine the amount of weight gain that is right for you.  Avoid raw meat and uncooked cheese. These carry germs that can cause birth defects in the baby.  If you have low calcium intake from food, talk to your health care provider about whether you should take a daily calcium supplement.  Eat four or five small meals rather than three large meals a day.  Limit foods that are high in fat and processed sugars, such as fried and sweet foods.  To prevent constipation: ? Drink enough fluid to keep your urine clear or pale yellow. ? Eat foods that are high in fiber, such as fresh fruits and vegetables, whole grains, and beans. Activity  Exercise only as directed by your health care provider. Most women can continue their usual exercise routine during pregnancy. Try to exercise for 30 minutes at least 5 days a week. Stop exercising if you experience uterine contractions.  Avoid heavy   lifting.  Do not exercise in extreme heat or humidity, or at high altitudes.  Wear low-heel, comfortable shoes.  Practice good posture.  You may continue to have sex unless your health care provider tells you otherwise. Relieving pain and discomfort  Take frequent breaks and rest with your legs elevated if you have leg cramps or low back pain.  Take warm sitz baths to soothe any pain or discomfort caused by hemorrhoids. Use hemorrhoid cream if your health care provider approves.  Wear a good support bra to prevent discomfort from breast tenderness.  If you develop varicose veins: ? Wear support pantyhose or compression stockings as told by your healthcare provider. ? Elevate your feet for 15 minutes, 3-4 times a day. Prenatal care  Write down your questions. Take them to your prenatal visits.  Keep all your prenatal visits as told by your health care provider. This is important. Safety  Wear your seat belt at  all times when driving.  Make a list of emergency phone numbers, including numbers for family, friends, the hospital, and police and fire departments. General instructions  Avoid cat litter boxes and soil used by cats. These carry germs that can cause birth defects in the baby. If you have a cat, ask someone to clean the litter box for you.  Do not travel far distances unless it is absolutely necessary and only with the approval of your health care provider.  Do not use hot tubs, steam rooms, or saunas.  Do not drink alcohol.  Do not use any products that contain nicotine or tobacco, such as cigarettes and e-cigarettes. If you need help quitting, ask your health care provider.  Do not use any medicinal herbs or unprescribed drugs. These chemicals affect the formation and growth of the baby.  Do not douche or use tampons or scented sanitary pads.  Do not cross your legs for long periods of time.  To prepare for the arrival of your baby: ? Take prenatal classes to understand, practice, and ask questions about labor and delivery. ? Make a trial run to the hospital. ? Visit the hospital and tour the maternity area. ? Arrange for maternity or paternity leave through employers. ? Arrange for family and friends to take care of pets while you are in the hospital. ? Purchase a rear-facing car seat and make sure you know how to install it in your car. ? Pack your hospital bag. ? Prepare the baby's nursery. Make sure to remove all pillows and stuffed animals from the baby's crib to prevent suffocation.  Visit your dentist if you have not gone during your pregnancy. Use a soft toothbrush to brush your teeth and be gentle when you floss. Contact a health care provider if:  You are unsure if you are in labor or if your water has broken.  You become dizzy.  You have mild pelvic cramps, pelvic pressure, or nagging pain in your abdominal area.  You have lower back pain.  You have persistent  nausea, vomiting, or diarrhea.  You have an unusual or bad smelling vaginal discharge.  You have pain when you urinate. Get help right away if:  Your water breaks before 37 weeks.  You have regular contractions less than 5 minutes apart before 37 weeks.  You have a fever.  You are leaking fluid from your vagina.  You have spotting or bleeding from your vagina.  You have severe abdominal pain or cramping.  You have rapid weight loss or weight gain.    You have shortness of breath with chest pain.  You notice sudden or extreme swelling of your face, hands, ankles, feet, or legs.  Your baby makes fewer than 10 movements in 2 hours.  You have severe headaches that do not go away when you take medicine.  You have vision changes. Summary  The third trimester is from week 28 through week 40, months 7 through 9. The third trimester is a time when the unborn baby (fetus) is growing rapidly.  During the third trimester, your discomfort may increase as you and your baby continue to gain weight. You may have abdominal, leg, and back pain, sleeping problems, and an increased need to urinate.  During the third trimester your breasts will keep growing and they will continue to become tender. A yellow fluid (colostrum) may leak from your breasts. This is the first milk you are producing for your baby.  False labor is a condition in which you feel small, irregular tightenings of the muscles in the womb (contractions) that eventually go away. These are called Braxton Hicks contractions. Contractions may last for hours, days, or even weeks before true labor sets in.  Signs of labor can include: abdominal cramps; regular contractions that start at 10 minutes apart and become stronger and more frequent with time; watery or bloody mucus discharge that comes from the vagina; increased pelvic pressure and dull back pain; and leaking of amniotic fluid. This information is not intended to replace advice  given to you by your health care provider. Make sure you discuss any questions you have with your health care provider. Document Released: 12/07/2001 Document Revised: 05/20/2016 Document Reviewed: 02/13/2013 Elsevier Interactive Patient Education  2017 [employer]  

## 2017-06-01 NOTE — Progress Notes (Signed)
ROB pt c/o possible painful sty on L eye x couple days. Pt requests rx Qvar for asthma.

## 2017-06-01 NOTE — Progress Notes (Signed)
Subjective:  Terri Estes is a 31 y.o. [phone] at [redacted]w[redacted]d being seen today for ongoing prenatal care.  She is currently monitored for the following issues for this high-risk pregnancy and has Supervision of high risk pregnancy, antepartum; History of cesarean delivery; Hx of preeclampsia, prior pregnancy, currently pregnant; History of HELLP syndrome, currently pregnant, second trimester; HSIL (high grade squamous intraepithelial lesion) on Pap smear of cervix; Illicit drug use; and Stye on her problem list.  Patient reports left eye stye.  Contractions: Not present. Vag. Bleeding: None.  Movement: Present. Denies leaking of fluid.   The following portions of the patient's history were reviewed and updated as appropriate: allergies, current medications, past family history, past medical history, past social history, past surgical history and problem list. Problem list updated.  Objective:   Vitals:   06/01/17 1042  BP: 130/82  Pulse: 85  Weight: 192 lb 9.6 oz (87.4 kg)    Fetal Status: Fetal Heart Rate (bpm): 144   Movement: Present     General:  Alert, oriented and cooperative. Patient is in no acute distress.  Skin: Skin is warm and dry. No rash noted.   Cardiovascular: Normal heart rate noted  Respiratory: Normal respiratory effort, no problems with respiration noted  Abdomen: Soft, gravid, appropriate for gestational age. Pain/Pressure: Present     Pelvic:  Cervical exam deferred        Extremities: Normal range of motion.  Edema: Trace  Mental Status: Normal mood and affect. Normal behavior. Normal judgment and thought content.   Urinalysis:      Assessment and Plan:  Pregnancy: G5P0131 at [redacted]w[redacted]d  1. Supervision of high risk pregnancy, antepartum Stable  2. History of cesarean delivery TOLAC papers signed  3. Hx of preeclampsia, prior pregnancy, currently pregnant No S/SX  4. History of HELLP syndrome, currently pregnant, second trimester No S/Sx  5. Illicit drug  use Continue with Subutex  6. Hordeolum externum of left upper eyelid  - gentamicin (GARAMYCIN) 0.3 % ophthalmic solution; Place 2 drops into the left eye 3 (three) times daily.  Dispense: 5 mL; Refill: 1  Preterm labor symptoms and general obstetric precautions including but not limited to vaginal bleeding, contractions, leaking of fluid and fetal movement were reviewed in detail with the patient. Please refer to After Visit Summary for other counseling recommendations.  Return in about 2 weeks (around 06/15/2017) for OB visit.   Chancy Milroy, MD

## 2017-06-17 ENCOUNTER — Ambulatory Visit (HOSPITAL_COMMUNITY)
Admission: RE | Admit: 2017-06-17 | Discharge: 2017-06-17 | Disposition: A | Discharge status: Home / Self Care | Payer: Medicaid Other | Source: Ambulatory Visit | Attending: Certified Nurse Midwife | Admitting: Certified Nurse Midwife

## 2017-06-17 ENCOUNTER — Encounter (HOSPITAL_COMMUNITY): Payer: Self-pay

## 2017-06-17 DIAGNOSIS — Z3A36 36 weeks gestation of pregnancy: Secondary | ICD-10-CM | POA: Insufficient documentation

## 2017-06-17 DIAGNOSIS — O99320 Drug use complicating pregnancy, unspecified trimester: Secondary | ICD-10-CM | POA: Diagnosis present

## 2017-06-17 DIAGNOSIS — F1120 Opioid dependence, uncomplicated: Secondary | ICD-10-CM

## 2017-06-17 DIAGNOSIS — O09299 Supervision of pregnancy with other poor reproductive or obstetric history, unspecified trimester: Secondary | ICD-10-CM | POA: Insufficient documentation

## 2017-06-17 DIAGNOSIS — Z362 Encounter for other antenatal screening follow-up: Secondary | ICD-10-CM | POA: Insufficient documentation

## 2017-06-17 DIAGNOSIS — O34219 Maternal care for unspecified type scar from previous cesarean delivery: Secondary | ICD-10-CM | POA: Diagnosis not present

## 2017-06-17 DIAGNOSIS — F1910 Other psychoactive substance abuse, uncomplicated: Secondary | ICD-10-CM | POA: Diagnosis not present

## 2017-06-17 DIAGNOSIS — O09219 Supervision of pregnancy with history of pre-term labor, unspecified trimester: Secondary | ICD-10-CM | POA: Diagnosis not present

## 2017-06-20 ENCOUNTER — Encounter: Payer: Medicaid Other | Admitting: Obstetrics and Gynecology

## 2017-06-27 ENCOUNTER — Encounter: Payer: Self-pay | Admitting: Obstetrics

## 2017-06-27 ENCOUNTER — Other Ambulatory Visit (HOSPITAL_COMMUNITY)
Admission: RE | Admit: 2017-06-27 | Discharge: 2017-06-27 | Disposition: A | Discharge status: Home / Self Care | Payer: Medicaid Other | Source: Ambulatory Visit | Attending: Obstetrics and Gynecology | Admitting: Obstetrics and Gynecology

## 2017-06-27 ENCOUNTER — Ambulatory Visit (INDEPENDENT_AMBULATORY_CARE_PROVIDER_SITE_OTHER): Payer: Medicaid Other | Admitting: Obstetrics and Gynecology

## 2017-06-27 ENCOUNTER — Encounter (HOSPITAL_COMMUNITY): Payer: Self-pay

## 2017-06-27 VITALS — BP 136/88 | HR 80 | Wt 201.0 lb

## 2017-06-27 DIAGNOSIS — R87613 High grade squamous intraepithelial lesion on cytologic smear of cervix (HGSIL): Secondary | ICD-10-CM

## 2017-06-27 DIAGNOSIS — O0993 Supervision of high risk pregnancy, unspecified, third trimester: Secondary | ICD-10-CM | POA: Diagnosis not present

## 2017-06-27 DIAGNOSIS — O0990 Supervision of high risk pregnancy, unspecified, unspecified trimester: Secondary | ICD-10-CM

## 2017-06-27 DIAGNOSIS — O09293 Supervision of pregnancy with other poor reproductive or obstetric history, third trimester: Secondary | ICD-10-CM | POA: Diagnosis not present

## 2017-06-27 DIAGNOSIS — O09299 Supervision of pregnancy with other poor reproductive or obstetric history, unspecified trimester: Secondary | ICD-10-CM

## 2017-06-27 DIAGNOSIS — Z98891 History of uterine scar from previous surgery: Secondary | ICD-10-CM

## 2017-06-27 DIAGNOSIS — O34219 Maternal care for unspecified type scar from previous cesarean delivery: Secondary | ICD-10-CM | POA: Diagnosis not present

## 2017-06-27 DIAGNOSIS — F1990 Other psychoactive substance use, unspecified, uncomplicated: Secondary | ICD-10-CM

## 2017-06-27 LAB — OB RESULTS CONSOLE GC/CHLAMYDIA: Gonorrhea: NEGATIVE

## 2017-06-27 NOTE — Progress Notes (Signed)
   PRENATAL VISIT NOTE  Subjective:  Terri Estes is a 31 y.o. [phone] at [redacted]w[redacted]d being seen today for ongoing prenatal care.  She is currently monitored for the following issues for this high-risk pregnancy and has Supervision of high risk pregnancy, antepartum; History of cesarean delivery; Hx of preeclampsia, prior pregnancy, currently pregnant; History of HELLP syndrome, currently pregnant, second trimester; HSIL (high grade squamous intraepithelial lesion) on Pap smear of cervix; Illicit drug use; and Stye on her problem list.  Patient reports no complaints.  Contractions: Not present. Vag. Bleeding: None.  Movement: Present. Denies leaking of fluid.   The following portions of the patient's history were reviewed and updated as appropriate: allergies, current medications, past family history, past medical history, past social history, past surgical history and problem list. Problem list updated.  Objective:   Vitals:   06/27/17 1334  BP: 136/88  Pulse: 80  Weight: 201 lb (91.2 kg)    Fetal Status: Fetal Heart Rate (bpm): 120 Fundal Height: 38 cm Movement: Present  Presentation: Vertex  General:  Alert, oriented and cooperative. Patient is in no acute distress.  Skin: Skin is warm and dry. No rash noted.   Cardiovascular: Normal heart rate noted  Respiratory: Normal respiratory effort, no problems with respiration noted  Abdomen: Soft, gravid, appropriate for gestational age. Pain/Pressure: Present     Pelvic:  Cervical exam performed Dilation: Closed Effacement (%): Thick Station: Ballotable  Extremities: Normal range of motion.  Edema: Mild pitting, slight indentation  Mental Status: Normal mood and affect. Normal behavior. Normal judgment and thought content.   Assessment and Plan:  Pregnancy: [id number] at [redacted]w[redacted]d  1. Supervision of high risk pregnancy, antepartum Patient is doing well without complaints Cultures collected today  2. HSIL (high grade squamous intraepithelial  lesion) on Pap smear of cervix Plan for colpo pp  3. History of cesarean delivery Desires TOLAC  4. Hx of preeclampsia, prior pregnancy, currently pregnant Normal BP Si/sx reviewed   5. Illicit drug use Continue subutex NICU tour has been arranged   Term labor symptoms and general obstetric precautions including but not limited to vaginal bleeding, contractions, leaking of fluid and fetal movement were reviewed in detail with the patient. Please refer to After Visit Summary for other counseling recommendations.  Return in about 1 week (around 07/04/2017) for [place].   Mora Bellman, MD

## 2017-06-27 NOTE — Progress Notes (Signed)
NICU NAS tour scheduled for 07/07/17 at 1:00.

## 2017-06-27 NOTE — Addendum Note (Signed)
Addended by: Mora Bellman on: 06/27/2017 03:19 PM   Modules accepted: Orders

## 2017-06-28 LAB — GC/CHLAMYDIA PROBE AMP (~~LOC~~) NOT AT ARMC
CHLAMYDIA, DNA PROBE: NEGATIVE
NEISSERIA GONORRHEA: NEGATIVE

## 2017-06-29 LAB — STREP GP B NAA: Strep Gp B NAA: NEGATIVE

## 2017-07-04 ENCOUNTER — Encounter: Payer: Medicaid Other | Admitting: Obstetrics and Gynecology

## 2017-07-07 ENCOUNTER — Encounter (HOSPITAL_COMMUNITY): Payer: Self-pay

## 2017-07-07 ENCOUNTER — Ambulatory Visit (INDEPENDENT_AMBULATORY_CARE_PROVIDER_SITE_OTHER): Payer: Medicaid Other | Admitting: Obstetrics & Gynecology

## 2017-07-07 ENCOUNTER — Inpatient Hospital Stay (HOSPITAL_COMMUNITY)
Admission: AD | Admit: 2017-07-07 | Discharge: 2017-07-12 | DRG: 765 | Disposition: A | Discharge status: Home / Self Care | Payer: Medicaid Other | Source: Ambulatory Visit | Attending: Obstetrics and Gynecology | Admitting: Obstetrics and Gynecology

## 2017-07-07 DIAGNOSIS — O1414 Severe pre-eclampsia complicating childbirth: Secondary | ICD-10-CM | POA: Diagnosis not present

## 2017-07-07 DIAGNOSIS — F1120 Opioid dependence, uncomplicated: Secondary | ICD-10-CM | POA: Diagnosis present

## 2017-07-07 DIAGNOSIS — O99324 Drug use complicating childbirth: Secondary | ICD-10-CM | POA: Diagnosis present

## 2017-07-07 DIAGNOSIS — F1420 Cocaine dependence, uncomplicated: Secondary | ICD-10-CM | POA: Diagnosis present

## 2017-07-07 DIAGNOSIS — Z3A39 39 weeks gestation of pregnancy: Secondary | ICD-10-CM | POA: Diagnosis not present

## 2017-07-07 DIAGNOSIS — O0993 Supervision of high risk pregnancy, unspecified, third trimester: Secondary | ICD-10-CM | POA: Diagnosis not present

## 2017-07-07 DIAGNOSIS — O134 Gestational [pregnancy-induced] hypertension without significant proteinuria, complicating childbirth: Principal | ICD-10-CM | POA: Diagnosis present

## 2017-07-07 DIAGNOSIS — O99334 Smoking (tobacco) complicating childbirth: Secondary | ICD-10-CM | POA: Diagnosis present

## 2017-07-07 DIAGNOSIS — O34211 Maternal care for low transverse scar from previous cesarean delivery: Secondary | ICD-10-CM | POA: Diagnosis present

## 2017-07-07 DIAGNOSIS — O324XX0 Maternal care for high head at term, not applicable or unspecified: Secondary | ICD-10-CM | POA: Diagnosis present

## 2017-07-07 DIAGNOSIS — O0990 Supervision of high risk pregnancy, unspecified, unspecified trimester: Secondary | ICD-10-CM

## 2017-07-07 DIAGNOSIS — O139 Gestational [pregnancy-induced] hypertension without significant proteinuria, unspecified trimester: Secondary | ICD-10-CM | POA: Diagnosis present

## 2017-07-07 DIAGNOSIS — F17210 Nicotine dependence, cigarettes, uncomplicated: Secondary | ICD-10-CM | POA: Diagnosis present

## 2017-07-07 DIAGNOSIS — O1413 Severe pre-eclampsia, third trimester: Secondary | ICD-10-CM | POA: Diagnosis not present

## 2017-07-07 LAB — RAPID URINE DRUG SCREEN, HOSP PERFORMED
AMPHETAMINES: NOT DETECTED
BARBITURATES: NOT DETECTED
Benzodiazepines: NOT DETECTED
COCAINE: NOT DETECTED
OPIATES: NOT DETECTED
TETRAHYDROCANNABINOL: NOT DETECTED

## 2017-07-07 LAB — COMPREHENSIVE METABOLIC PANEL
ALT: 10 U/L — ABNORMAL LOW (ref 14–54)
AST: 20 U/L (ref 15–41)
Albumin: 2.8 g/dL — ABNORMAL LOW (ref 3.5–5.0)
Alkaline Phosphatase: 177 U/L — ABNORMAL HIGH (ref 38–126)
Anion gap: 7 (ref 5–15)
BUN: 9 mg/dL (ref 6–20)
CHLORIDE: 103 mmol/L (ref 101–111)
CO2: 23 mmol/L (ref 22–32)
Calcium: 8.6 mg/dL — ABNORMAL LOW (ref 8.9–10.3)
Creatinine, Ser: 0.84 mg/dL (ref 0.44–1.00)
GFR calc Af Amer: >60 mL/min (ref >60)
Glucose, Bld: 116 mg/dL — ABNORMAL HIGH (ref 65–99)
POTASSIUM: 3.5 mmol/L (ref 3.5–5.1)
SODIUM: 133 mmol/L — AB (ref 135–145)
Total Bilirubin: 0.4 mg/dL (ref 0.3–1.2)
Total Protein: 6.4 g/dL — ABNORMAL LOW (ref 6.5–8.1)

## 2017-07-07 LAB — URINALYSIS, ROUTINE W REFLEX MICROSCOPIC
BILIRUBIN URINE: NEGATIVE
GLUCOSE, UA: NEGATIVE mg/dL
Hgb urine dipstick: NEGATIVE
KETONES UR: NEGATIVE mg/dL
LEUKOCYTES UA: NEGATIVE
Nitrite: NEGATIVE
PH: 6 (ref 5.0–8.0)
Protein, ur: 30 mg/dL — AB
Specific Gravity, Urine: 1.025 (ref 1.005–1.030)

## 2017-07-07 LAB — CBC
HEMATOCRIT: 30 % — AB (ref 36.0–46.0)
HEMOGLOBIN: 10.1 g/dL — AB (ref 12.0–15.0)
MCH: 30.3 pg (ref 26.0–34.0)
MCHC: 33.7 g/dL (ref 30.0–36.0)
MCV: 90.1 fL (ref 78.0–100.0)
PLATELETS: 150 10*3/uL (ref 150–400)
RBC: 3.33 MIL/uL — AB (ref 3.87–5.11)
RDW: 14.8 % (ref 11.5–15.5)
WBC: 9.1 10*3/uL (ref 4.0–10.5)

## 2017-07-07 LAB — PROTEIN / CREATININE RATIO, URINE
Creatinine, Urine: 270 mg/dL
Protein Creatinine Ratio: 0.14 mg/mg{Cre} (ref 0.00–0.15)
Total Protein, Urine: 37 mg/dL

## 2017-07-07 LAB — TYPE AND SCREEN
ABO/RH(D): A POS
Antibody Screen: NEGATIVE

## 2017-07-07 MED ORDER — LURASIDONE HCL 40 MG PO TABS
40.0000 mg | ORAL_TABLET | Freq: Every day | ORAL | Status: DC
  Administered 2017-07-07 – 2017-07-09 (×3): 40 mg via ORAL
  Filled 2017-07-07 (×4): qty 1

## 2017-07-07 MED ORDER — LIDOCAINE HCL (PF) 1 % IJ SOLN: 30.0000 mL | INTRAMUSCULAR | Status: DC | PRN

## 2017-07-07 MED ORDER — SERTRALINE HCL 50 MG PO TABS
50.0000 mg | ORAL_TABLET | Freq: Every day | ORAL | Status: DC
  Administered 2017-07-08 – 2017-07-10 (×3): 50 mg via ORAL
  Filled 2017-07-07 (×4): qty 1

## 2017-07-07 MED ORDER — BUPRENORPHINE HCL 8 MG SL SUBL
4.0000 mg | SUBLINGUAL_TABLET | Freq: Every day | SUBLINGUAL | Status: DC
  Administered 2017-07-07 – 2017-07-09 (×3): 4 mg via SUBLINGUAL
  Filled 2017-07-07 (×3): qty 1

## 2017-07-07 MED ORDER — FLEET ENEMA 7-19 GM/118ML RE ENEM: 1.0000 | ENEMA | RECTAL | Status: DC | PRN

## 2017-07-07 MED ORDER — OXYTOCIN 40 UNITS IN LACTATED RINGERS INFUSION - SIMPLE MED: 2.5000 [IU]/h | INTRAVENOUS | Status: DC

## 2017-07-07 MED ORDER — LACTATED RINGERS IV SOLN
INTRAVENOUS | Status: DC
  Administered 2017-07-07: 125 mL via INTRAVENOUS
  Administered 2017-07-08 – 2017-07-09 (×4): via INTRAVENOUS
  Administered 2017-07-10: 125 mL/h via INTRAVENOUS
  Administered 2017-07-10: 04:00:00 via INTRAVENOUS
  Administered 2017-07-10: 125 mL/h via INTRAVENOUS

## 2017-07-07 MED ORDER — SOD CITRATE-CITRIC ACID 500-334 MG/5ML PO SOLN
30.0000 mL | ORAL | Status: DC | PRN
  Administered 2017-07-10: 30 mL via ORAL
  Filled 2017-07-07: qty 15

## 2017-07-07 MED ORDER — BUPRENORPHINE HCL 8 MG SL SUBL
8.0000 mg | SUBLINGUAL_TABLET | Freq: Every day | SUBLINGUAL | Status: DC
  Administered 2017-07-08 – 2017-07-10 (×3): 8 mg via SUBLINGUAL
  Filled 2017-07-07 (×3): qty 1

## 2017-07-07 MED ORDER — PANTOPRAZOLE SODIUM 40 MG PO TBEC
40.0000 mg | DELAYED_RELEASE_TABLET | Freq: Every day | ORAL | Status: DC
  Administered 2017-07-08 – 2017-07-10 (×3): 40 mg via ORAL
  Filled 2017-07-07 (×3): qty 1

## 2017-07-07 MED ORDER — OXYCODONE-ACETAMINOPHEN 5-325 MG PO TABS: 1.0000 | ORAL_TABLET | ORAL | Status: DC | PRN

## 2017-07-07 MED ORDER — OXYTOCIN 40 UNITS IN LACTATED RINGERS INFUSION - SIMPLE MED
1.0000 m[IU]/min | INTRAVENOUS | Status: DC
  Administered 2017-07-07: 2 m[IU]/min via INTRAVENOUS
  Filled 2017-07-07 (×3): qty 1000

## 2017-07-07 MED ORDER — LACTATED RINGERS IV SOLN
500.0000 mL | INTRAVENOUS | Status: DC | PRN
  Administered 2017-07-10: 250 mL via INTRAVENOUS

## 2017-07-07 MED ORDER — LURASIDONE HCL 40 MG PO TABS
40.0000 mg | ORAL_TABLET | Freq: Every day | ORAL | Status: DC
  Filled 2017-07-07: qty 1

## 2017-07-07 MED ORDER — ONDANSETRON HCL 4 MG/2ML IJ SOLN: 4.0000 mg | Freq: Four times a day (QID) | INTRAMUSCULAR | Status: DC | PRN

## 2017-07-07 MED ORDER — OXYCODONE-ACETAMINOPHEN 5-325 MG PO TABS: 2.0000 | ORAL_TABLET | ORAL | Status: DC | PRN

## 2017-07-07 MED ORDER — TERBUTALINE SULFATE 1 MG/ML IJ SOLN: 0.2500 mg | Freq: Once | INTRAMUSCULAR | Status: DC | PRN

## 2017-07-07 MED ORDER — OXYTOCIN BOLUS FROM INFUSION: 500.0000 mL | Freq: Once | INTRAVENOUS | Status: DC

## 2017-07-07 MED ORDER — ACETAMINOPHEN 325 MG PO TABS: 650.0000 mg | ORAL_TABLET | ORAL | Status: DC | PRN

## 2017-07-07 NOTE — H&P (Signed)
Terri Estes is a 31 y.o. female [phone] with IUP at [redacted]w[redacted]d presenting for IOL for GHTN, dx toay. [facility]are at [facility] since 10 wks  Prenatal History/Complications: Hx eclampsia/STAT CS @ 30 weeks w/prior pregnancy.  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 Chronic pain   . Pregnancy induced hypertension     Past Surgical History: Past Surgical History:  Procedure Laterality Date  . CESAREAN SECTION    . WISDOM TOOTH EXTRACTION      Obstetrical History: OB History    Gravida Para Term Preterm AB Living   5 1 0 1 3 1    SAB TAB Ectopic Multiple Live Births     3     1      Social History: Social History   Social History  . Marital status: Single    Spouse name: N/A  . Number of children: N/A  . Years of education: N/A   Social History Main Topics  . Smoking status: Current Every Day Smoker    Packs/day: 0.50    Years: 5.00    Types: Cigarettes  . Smokeless tobacco: Never Used  . Alcohol use No  . Drug use: Yes    Types: Marijuana, Cocaine     Comment: subutex  . Sexual activity: Yes    Birth control/ protection: None   Other Topics Concern  . None   Social History Narrative  . None    Family History: Family History  Problem Relation Age of Onset  . Kidney disease Maternal Grandmother   . Kidney disease Maternal Grandfather   . Kidney disease Paternal Grandmother   . Diabetes Paternal Grandmother   . Kidney disease Paternal Grandfather     Allergies: No Known Allergies  Prescriptions Prior to Admission  Medication Sig Dispense Refill Last Dose  . albuterol (PROVENTIL HFA;VENTOLIN HFA) 108 (90 Base) MCG/ACT inhaler Inhale 2 puffs into the lungs 4 (four) times daily.   07/07/2017 at Unknown time  . buprenorphine (SUBUTEX) 2 MG SUBL SL tablet Place 4 mg under the tongue at bedtime.   07/06/2017 at Unknown time  . buprenorphine  (SUBUTEX) 8 MG SUBL SL tablet Place 8 mg under the tongue daily.    07/07/2017 at Unknown time  . lurasidone (LATUDA) 40 MG TABS tablet Take 40 mg by mouth at bedtime.   07/06/2017 at Unknown time  . pantoprazole (PROTONIX) 40 MG tablet Take 1 tablet (40 mg total) by mouth daily. 30 tablet 6 07/07/2017 at Unknown time  . polyethylene glycol (MIRALAX / GLYCOLAX) packet Take 17 g by mouth daily as needed for mild constipation.    Past Week at Unknown time  . Prenatal MV-Min-FA-Omega-3 (PRENATAL GUMMIES/DHA & FA) 0.4-32.5 MG CHEW Chew 2 each by mouth daily.   07/07/2017 at Unknown time  . sertraline (ZOLOFT) 50 MG tablet Take 50 mg by mouth daily.   07/07/2017 at Unknown time       Review of Systems   Constitutional: Negative for fever and chills Eyes: Negative for visual disturbances Respiratory: Negative for shortness of breath, dyspnea Cardiovascular: Negative for chest pain or palpitations  Gastrointestinal: Negative for abdominal pain, vomiting, diarrhea and constipation.   Genitourinary: Negative for dysuria and urgency Musculoskeletal: Negative for back pain, joint pain, myalgias  Neurological: Negative for dizziness and headaches    Blood pressure 131/90, pulse 80, temperature 98 F (36.7 C), temperature  source Oral, resp. rate 18, height 5\' 2"  (1.575 m), weight 90.3 kg (199 lb), last menstrual period 10/07/2016. General appearance: alert, cooperative and no distress Lungs: clear to auscultation bilaterally Heart: regular rate and rhythm Abdomen: soft, non-tender; bowel sounds normal Extremities: Homans sign is negative, no sign of DVT DTR's 2+ Presentation: cephalic Fetal monitoring  Baseline: 140 bpm, Variability: Good {> 6 bpm), Accelerations: Reactive and Decelerations: Absent Uterine activity  Not felt by pt, but q 2-5 m inutes on EFM  Dilation: Closed Effacement (%): Thick Station: -2 Exam by:: Mylo Red CNm   Prenatal labs: ABO, Rh: --/--/A POS (07/12  1838) Antibody: NEG (07/12 1838) Rubella: immune RPR: Non Reactive (04/18 1100)  HBsAg: Negative (12/21 1130)  HIV: Non Reactive (04/18 1100)  GBS: Negative (07/02 1522)    Prenatal Transfer Tool  Maternal Diabetes: No Genetic Screening: Normal Maternal Ultrasounds/Referrals: Normal Fetal Ultrasounds or other Referrals:  None Maternal Substance Abuse:  Opioids/cocaine Significant Maternal Medications:  Meds include: Other: subutex Significant Maternal Lab Results: Lab values include: Group B Strep negative    Results for orders placed or performed during the hospital encounter of 07/07/17 (from the past 24 hour(s))  Urinalysis, Routine w reflex microscopic   Collection Time: 07/07/17  5:36 PM  Result Value Ref Range   Color, Urine YELLOW YELLOW   APPearance HAZY (A) CLEAR   Specific Gravity, Urine 1.025 1.005 - 1.030   pH 6.0 5.0 - 8.0   Glucose, UA NEGATIVE NEGATIVE mg/dL   Hgb urine dipstick NEGATIVE NEGATIVE   Bilirubin Urine NEGATIVE NEGATIVE   Ketones, ur NEGATIVE NEGATIVE mg/dL   Protein, ur 30 (A) NEGATIVE mg/dL   Nitrite NEGATIVE NEGATIVE   Leukocytes, UA NEGATIVE NEGATIVE   RBC / HPF 0-5 0 - 5 RBC/hpf   WBC, UA 0-5 0 - 5 WBC/hpf   Bacteria, UA RARE (A) NONE SEEN   Squamous Epithelial / LPF 6-30 (A) NONE SEEN   Mucous PRESENT   Protein / creatinine ratio, urine   Collection Time: 07/07/17  5:36 PM  Result Value Ref Range   Creatinine, Urine 270.00 mg/dL   Total Protein, Urine 37 mg/dL   Protein Creatinine Ratio 0.14 0.00 - 0.15 mg/mg[Cre]  CBC   Collection Time: 07/07/17  6:36 PM  Result Value Ref Range   WBC 9.1 4.0 - 10.5 K/uL   RBC 3.33 (L) 3.87 - 5.11 MIL/uL   Hemoglobin 10.1 (L) 12.0 - 15.0 g/dL   HCT 30.0 (L) 36.0 - 46.0 %   MCV 90.1 78.0 - 100.0 fL   MCH 30.3 26.0 - 34.0 pg   MCHC 33.7 30.0 - 36.0 g/dL   RDW 14.8 11.5 - 15.5 %   Platelets 150 150 - 400 K/uL  Comprehensive metabolic panel   Collection Time: 07/07/17  6:36 PM  Result Value Ref  Range   Sodium 133 (L) 135 - 145 mmol/L   Potassium 3.5 3.5 - 5.1 mmol/L   Chloride 103 101 - 111 mmol/L   CO2 23 22 - 32 mmol/L   Glucose, Bld 116 (H) 65 - 99 mg/dL   BUN 9 6 - 20 mg/dL   Creatinine, Ser 0.84 0.44 - 1.00 mg/dL   Calcium 8.6 (L) 8.9 - 10.3 mg/dL   Total Protein 6.4 (L) 6.5 - 8.1 g/dL   Albumin 2.8 (L) 3.5 - 5.0 g/dL   AST 20 15 - 41 U/L   ALT 10 (L) 14 - 54 U/L   Alkaline Phosphatase 177 (H) 38 -  126 U/L   Total Bilirubin 0.4 0.3 - 1.2 mg/dL   GFR calc non Af Amer >60 >60 mL/min   GFR calc Af Amer >60 >60 mL/min   Anion gap 7 5 - 15  Type and screen [place]   Collection Time: 07/07/17  6:38 PM  Result Value Ref Range   ABO/RH(D) A POS    Antibody Screen NEG    Sample Expiration 07/10/2017    Clinic [facility] Prenatal Labs  Dating LMP Blood type: A/Positive/-- (12/21 1130)   Genetic Screen    AFP: normal    NIPS:mat 21:normal Antibody:Negative (12/21 1130)  Anatomic [place] Normal @18wks ; female fetus Recommend ultrasound for growth in 4-6 weeks; serial  ultrasounds thereafter. Rubella: 4.10 (12/21 1130)  GTT A1C:5.3              Third trimester: normal RPR: Non Reactive (04/18 1100)   Flu vaccine Declined  HBsAg: Negative (12/21 1130)   TDaP vaccine Declined                                     Rhogam:n/a A+ HIV: Non Reactive (04/18 1100)   Baby Food  Bottle            And breast                                 TLX:BWIOMBTD (07/02 1522)negative (For PCN allergy, check sensitivities)  Contraception POPs then COCs (FOB out of jail 8/9) Pap: HSIL CIN2/3 @ NOB  Circumcision    Pediatrician Undecided, list given   Support Person [name]       Assessment: Terri Estes is a 31 y.o. [phone] with an IUP at [redacted]w[redacted]d presenting for IOL for [place].  Plan: #Labor: Pitocin>Foley->pitocin #Pain:  Per request #FWB Cat 1  CRESENZO-DISHMAN,Baillie Mohammad 07/07/2017, 9:14 PM

## 2017-07-07 NOTE — Patient Instructions (Signed)
Hypertension During Pregnancy Hypertension is also called high blood pressure. High blood pressure means that the force of your blood moving in your body is too strong. When you are pregnant, this condition should be watched carefully. It can cause problems for you and your baby. Follow these instructions at home: Eating and drinking  Drink enough fluid to keep your pee (urine) clear or pale yellow.  Eat healthy foods that are low in salt (sodium). ? Do not add salt to your food. ? Check labels on foods and drinks to see much salt is in them. Look on the label where you see "Sodium." Lifestyle  Do not use any products that contain nicotine or tobacco, such as cigarettes and e-cigarettes. If you need help quitting, ask your doctor.  Do not use alcohol.  Avoid caffeine.  Avoid stress. Rest and get plenty of sleep. General instructions  Take over-the-counter and prescription medicines only as told by your doctor.  While lying down, lie on your left side. This keeps pressure off your baby.  While sitting or lying down, raise (elevate) your feet. Try putting some pillows under your lower legs.  Exercise regularly. Ask your doctor what kinds of exercise are best for you.  Keep all prenatal and follow-up visits as told by your doctor. This is important. Contact a doctor if:  You have symptoms that your doctor told you to watch for, such as: ? Fever. ? Throwing up (vomiting). ? Headache. Get help right away if:  You have very bad pain in your belly (abdomen).  You are throwing up, and this does not get better with treatment.  You suddenly get swelling in your hands, ankles, or face.  You gain 4 lb (1.8 kg) or more in 1 week.  You get bleeding from your vagina.  You have blood in your pee.  You do not feel your baby moving as much as normal.  You have a change in vision.  You have muscle twitching or sudden tightening (spasms).  You have trouble breathing.  Your lips  or fingernails turn blue. This information is not intended to replace advice given to you by your health care provider. Make sure you discuss any questions you have with your health care provider. Document Released: 01/15/2011 Document Revised: 08/24/2016 Document Reviewed: 08/24/2016 Elsevier Interactive Patient Education  2017 Elsevier Inc.  

## 2017-07-07 NOTE — MAU Note (Signed)
Pt had elevated b/p in office. Sent for [place] work up. Reports she has a headache today. Denies any abd pain.

## 2017-07-07 NOTE — Progress Notes (Signed)
Reactive NST   PRENATAL VISIT NOTE  Subjective:  Terri Estes is a 31 y.o. [phone] at [redacted]w[redacted]d being seen today for ongoing prenatal care.  She is currently monitored for the following issues for this high-risk pregnancy and has Supervision of high risk pregnancy, antepartum; History of cesarean delivery; Hx of preeclampsia, prior pregnancy, currently pregnant; History of HELLP syndrome, currently pregnant, second trimester; HSIL (high grade squamous intraepithelial lesion) on Pap smear of cervix; Illicit drug use; and [place] on her problem list.  Patient reports no complaints.  Contractions: Not present. Vag. Bleeding: None.  Movement: Present. Denies leaking of fluid.   The following portions of the patient's history were reviewed and updated as appropriate: allergies, current medications, past family history, past medical history, past social history, past surgical history and problem list. Problem list updated.  Objective:   Vitals:   07/07/17 1020 07/07/17 1105  BP: (!) 145/99 135/90  Pulse: 83   Weight: 199 lb (90.3 kg)     Fetal Status: Fetal Heart Rate (bpm): 117 (Simultaneous filing. User may not have seen previous data.) Fundal Height: 37 cm Movement: Present     General:  Alert, oriented and cooperative. Patient is in no acute distress.  Skin: Skin is warm and dry. No rash noted.   Cardiovascular: Normal heart rate noted  Respiratory: Normal respiratory effort, no problems with respiration noted  Abdomen: Soft, gravid, appropriate for gestational age. Pain/Pressure: Present     Pelvic:  Cervical exam deferred        Extremities: Normal range of motion.  Edema: Trace  Mental Status: Normal mood and affect. Normal behavior. Normal judgment and thought content.   Assessment and Plan:  Pregnancy: [id number] at [redacted]w[redacted]d  1. Supervision of high risk pregnancy, antepartum NST rx - Fetal nonstress test  Term labor symptoms and general obstetric precautions including but not  limited to vaginal bleeding, contractions, leaking of fluid and fetal movement were reviewed in detail with the patient. Please refer to After Visit Summary for other counseling recommendations.  Return in about 4 days (around 07/11/2017). To MAU for evaluation of GHTN vs preeclampsia  Emeterio Reeve, MD

## 2017-07-07 NOTE — Anesthesia Pain Management Evaluation Note (Signed)
  CRNA Pain Management Visit Note  Patient: Terri Estes, 31 y.o., female  "Hello I am a member of the anesthesia team at [facility]. We have an anesthesia team available at all times to provide care throughout the hospital, including epidural management and anesthesia for C-section. I don't know your plan for the delivery whether it a natural birth, water birth, IV sedation, nitrous supplementation, doula or epidural, but we want to meet your pain goals."   1.Was your pain managed to your expectations on prior hospitalizations?   Yes   2.What is your expectation for pain management during this hospitalization?     Epidural, IV pain meds and Nitrous Oxide  3.How can we help you reach that goal? Be available  Record the patient's initial score and the patient's pain goal.   Pain: 4  Pain Goal: 5 [facility] wants you to be able to say your pain was always managed very well.  [facility] 07/07/2017

## 2017-07-07 NOTE — Progress Notes (Addendum)
G5P1 @ [redacted] wksga. Sent from [facility] officer for HTN lab workup. Denies LOF or bleeding. + FM. EFM applied. Serial BP initiated.   Pending MD orders.   1815: Orders received to admit pt for induction of labor.   1818: Birthing charge nurse notified. Report status of pt given. room assigned to 164  Pt to birthing via wheel chair.

## 2017-07-08 LAB — RPR: RPR: NONREACTIVE

## 2017-07-08 LAB — HIV ANTIBODY (ROUTINE TESTING W REFLEX): HIV SCREEN 4TH GENERATION: NONREACTIVE

## 2017-07-08 MED ORDER — FENTANYL CITRATE (PF) 100 MCG/2ML IJ SOLN
100.0000 ug | INTRAMUSCULAR | Status: DC | PRN
  Administered 2017-07-08 – 2017-07-09 (×7): 100 ug via INTRAVENOUS
  Filled 2017-07-08 (×6): qty 2

## 2017-07-08 MED ORDER — ZOLPIDEM TARTRATE 5 MG PO TABS: 5.0000 mg | ORAL_TABLET | Freq: Every evening | ORAL | Status: DC | PRN

## 2017-07-08 MED ORDER — FENTANYL CITRATE (PF) 100 MCG/2ML IJ SOLN
INTRAMUSCULAR | Status: AC
  Filled 2017-07-08: qty 2

## 2017-07-08 NOTE — Progress Notes (Signed)
Labor Progress Note  Terri Estes is a 31 y.o. [phone] at [redacted]w[redacted]d  admitted for gHTN, [place].  Patient currently starting to feel increased pressure but talking through contractions and well-appearing.  FHT: 120 bmp, mod var, accels no decels UC:  Irregular  SVE:   Dilation: 1 Effacement (%): 80 Station: -2 Exam by:: Dr Vanetta Shawl  Pitocin @ 6 mu/min  Labs: Lab Results  Component Value Date   WBC 9.1 07/07/2017   HGB 10.1 (L) 07/07/2017   HCT 30.0 (L) 07/07/2017   MCV 90.1 07/07/2017   PLT 150 07/07/2017    Assessment / Plan: Labor: Progressing normally, FB in with pit at 6, once foley out can inc pit per protocol Fetal Wellbeing:  Category I Pain Control:  Epidural on request Anticipated MOD:  NSVD  Expectant management  Everrett Coombe, MD PGY-2 [facility]

## 2017-07-08 NOTE — Progress Notes (Signed)
Pt up to walk on unit with aunt.  Encouraged to rest due to high blood pressure but is determined to walk to assist with labor induction.  Telemetry monitors adjusted to assist with monitoring while on pitocin.

## 2017-07-08 NOTE — Progress Notes (Signed)
Vitals:   07/08/17 0605 07/08/17 0631 07/08/17 0701 07/08/17 0731  BP: (!) 137/95 (!) 144/92 (!) 136/94 131/87  Pulse: 73 79 77 68  Resp: 18     Temp: 98 F (36.7 C)     TempSrc:      Weight:      Height:        Temp:  [97.7 F (36.5 C)-98.1 F (36.7 C)] 98 F (36.7 C) (07/13 0605) Pulse Rate:  [65-87] 68 (07/13 0731) Resp:  [18-20] 18 (07/13 0605) BP: (122-148)/(66-99) 131/87 (07/13 0731) Weight:  [90.3 kg (199 lb)] 90.3 kg (199 lb) (07/12 1736) No intake/output data recorded. No intake/output data recorded. No intake or output data in the 24 hours ending 07/08/17 0846  cx still FT/long/-3.   FHR cat 1. Pit at 10 mu/min Will attempt foley when possible

## 2017-07-08 NOTE — Progress Notes (Signed)
Patient back from walking.  Sitting in rocking chair with aunt at bedside.

## 2017-07-09 ENCOUNTER — Encounter (HOSPITAL_COMMUNITY): Payer: Self-pay

## 2017-07-09 LAB — CBC
HCT: 30 % — ABNORMAL LOW (ref 36.0–46.0)
HEMATOCRIT: 30.4 % — AB (ref 36.0–46.0)
HEMOGLOBIN: 10.1 g/dL — AB (ref 12.0–15.0)
Hemoglobin: 10 g/dL — ABNORMAL LOW (ref 12.0–15.0)
MCH: 30 pg (ref 26.0–34.0)
MCH: 30.2 pg (ref 26.0–34.0)
MCHC: 33.2 g/dL (ref 30.0–36.0)
MCHC: 33.3 g/dL (ref 30.0–36.0)
MCV: 90.2 fL (ref 78.0–100.0)
MCV: 90.6 fL (ref 78.0–100.0)
PLATELETS: 136 10*3/uL — AB (ref 150–400)
Platelets: 142 10*3/uL — ABNORMAL LOW (ref 150–400)
RBC: 3.37 MIL/uL — ABNORMAL LOW (ref 3.87–5.11)
RBC: 3.31 MIL/uL — AB (ref 3.87–5.11)
RDW: 15 % (ref 11.5–15.5)
RDW: 14.9 % (ref 11.5–15.5)
WBC: 9.3 10*3/uL (ref 4.0–10.5)
WBC: 8.8 10*3/uL (ref 4.0–10.5)

## 2017-07-09 LAB — COMPREHENSIVE METABOLIC PANEL
ALBUMIN: 2.4 g/dL — AB (ref 3.5–5.0)
ALT: 10 U/L — AB (ref 14–54)
AST: 18 U/L (ref 15–41)
Alkaline Phosphatase: 161 U/L — ABNORMAL HIGH (ref 38–126)
Anion gap: 8 (ref 5–15)
CHLORIDE: 103 mmol/L (ref 101–111)
CO2: 25 mmol/L (ref 22–32)
CREATININE: 0.74 mg/dL (ref 0.44–1.00)
Calcium: 8.5 mg/dL — ABNORMAL LOW (ref 8.9–10.3)
GFR calc Af Amer: >60 mL/min (ref >60)
GLUCOSE: 84 mg/dL (ref 65–99)
Potassium: 3.6 mmol/L (ref 3.5–5.1)
Sodium: 136 mmol/L (ref 135–145)
Total Bilirubin: 0.6 mg/dL (ref 0.3–1.2)
Total Protein: 5.7 g/dL — ABNORMAL LOW (ref 6.5–8.1)

## 2017-07-09 LAB — PROTEIN / CREATININE RATIO, URINE
CREATININE, URINE: 43 mg/dL
Protein Creatinine Ratio: 0.35 mg/mg{Cre} — ABNORMAL HIGH (ref 0.00–0.15)
Total Protein, Urine: 15 mg/dL

## 2017-07-09 MED ORDER — PHENYLEPHRINE 40 MCG/ML (10ML) SYRINGE FOR IV PUSH (FOR BLOOD PRESSURE SUPPORT)
PREFILLED_SYRINGE | INTRAVENOUS | Status: AC
  Filled 2017-07-09: qty 20

## 2017-07-09 MED ORDER — PHENYLEPHRINE 40 MCG/ML (10ML) SYRINGE FOR IV PUSH (FOR BLOOD PRESSURE SUPPORT)
80.0000 ug | PREFILLED_SYRINGE | INTRAVENOUS | Status: DC | PRN
  Filled 2017-07-09: qty 10

## 2017-07-09 MED ORDER — EPHEDRINE 5 MG/ML INJ: 10.0000 mg | INTRAVENOUS | Status: DC | PRN

## 2017-07-09 MED ORDER — HYDRALAZINE HCL 20 MG/ML IJ SOLN: 10.0000 mg | Freq: Once | INTRAMUSCULAR | Status: DC | PRN

## 2017-07-09 MED ORDER — FENTANYL 2.5 MCG/ML BUPIVACAINE 1/10 % EPIDURAL INFUSION (WH - ANES)
INTRAMUSCULAR | Status: AC
  Filled 2017-07-09: qty 100

## 2017-07-09 MED ORDER — LACTATED RINGERS IV SOLN: 500.0000 mL | Freq: Once | INTRAVENOUS | Status: DC

## 2017-07-09 MED ORDER — EPHEDRINE 5 MG/ML INJ
INTRAVENOUS | Status: AC
  Filled 2017-07-09: qty 4

## 2017-07-09 MED ORDER — MAGNESIUM SULFATE BOLUS VIA INFUSION
4.0000 g | Freq: Once | INTRAVENOUS | Status: AC
  Administered 2017-07-09: 4 g via INTRAVENOUS
  Filled 2017-07-09: qty 500

## 2017-07-09 MED ORDER — MAGNESIUM SULFATE 40 G IN LACTATED RINGERS - SIMPLE
2.0000 g/h | INTRAVENOUS | Status: AC
  Administered 2017-07-09 – 2017-07-11 (×3): 2 g/h via INTRAVENOUS
  Filled 2017-07-09 (×3): qty 40

## 2017-07-09 MED ORDER — LABETALOL HCL 5 MG/ML IV SOLN: 20.0000 mg | INTRAVENOUS | Status: DC | PRN

## 2017-07-09 MED ORDER — LABETALOL HCL 5 MG/ML IV SOLN
20.0000 mg | INTRAVENOUS | Status: DC | PRN
  Administered 2017-07-09: 20 mg via INTRAVENOUS
  Filled 2017-07-09: qty 4

## 2017-07-09 MED ORDER — MAGNESIUM SULFATE 40 G IN LACTATED RINGERS - SIMPLE: 2.0000 g/h | INTRAVENOUS | Status: DC

## 2017-07-09 MED ORDER — PHENYLEPHRINE 40 MCG/ML (10ML) SYRINGE FOR IV PUSH (FOR BLOOD PRESSURE SUPPORT): 80.0000 ug | PREFILLED_SYRINGE | INTRAVENOUS | Status: DC | PRN

## 2017-07-09 MED ORDER — DIPHENHYDRAMINE HCL 50 MG/ML IJ SOLN: 12.5000 mg | INTRAMUSCULAR | Status: DC | PRN

## 2017-07-09 MED ORDER — FENTANYL 2.5 MCG/ML BUPIVACAINE 1/10 % EPIDURAL INFUSION (WH - ANES)
14.0000 mL/h | INTRAMUSCULAR | Status: DC | PRN
  Administered 2017-07-10: 14 mL/h via EPIDURAL
  Filled 2017-07-09: qty 100

## 2017-07-09 MED ORDER — OXYTOCIN 40 UNITS IN LACTATED RINGERS INFUSION - SIMPLE MED
1.0000 m[IU]/min | INTRAVENOUS | Status: DC
  Administered 2017-07-09: 16 m[IU]/min via INTRAVENOUS
  Administered 2017-07-09 – 2017-07-10 (×2): 2 m[IU]/min via INTRAVENOUS

## 2017-07-09 NOTE — Progress Notes (Signed)
Patient ID: Terri Estes, female   DOB: [birth year], 31 y.o.   MRN: [id number] Labor Progress Note  S: Patient seen & examined for progress of labor. Patient comfortable and awaiting epidural, within 4 hours, but must wait because pt ate large breakfast .   O: BP 134/88 (BP Location: Left Arm)   Pulse 72   Temp 98.6 F (37 C) (Oral)   Resp 18   Ht 5\' 2"  (1.575 m)   Wt 90.3 kg (199 lb)   LMP 10/07/2016 (Exact Date)   BMI 36.40 kg/m   FHT: 125bpm, mod var, +accels, no decels TOCO: irregular/ absent, patient looks comfortable during contractions  CVE: Dilation: 3 Effacement (%): 50 Station: -3 Presentation: Vertex Exam by:: [place], DO  A&P: 31 y.o. [id number] [redacted]w[redacted]d IOL due to GHTN, TOLAC, prev h/o eclampsia.  Restart pitocin, on 2 milli-units/min Continue current management Anticipate SVD  Terri Gabriella Guile, DO [facility] Resident PGY-1 07/09/2017 11:46 AM

## 2017-07-09 NOTE — Progress Notes (Addendum)
Patient ID: Terri Estes, female   DOB: 11-Jul-1986, 31 y.o.   MRN: [id number] BPs elevated to severe range now  Vitals:   07/09/17 1932 07/09/17 2001 07/09/17 2032 07/09/17 2131  BP: 132/80 (!) 150/92 (!) 153/88 (!) 150/89  Pulse: (!) 58 (!) 58 (!) 58 60  Resp: 16   18  Temp: 98.8 F (37.1 C)     TempSrc:      SpO2:      Weight:      Height:       RN reports multiple diastolic BPs over 539 (though not in epic) 178/109190/107 171/97 169/104 172/106  Will initiate Hypertension protocol  FHR reactive.  UCs every 2 min  Dilation: 4 Effacement (%): 50 Cervical Position: Posterior Station: -2 Presentation: Vertex Exam by:: Roe Rutherford  Consulted Dr Rip Harbour with presentation and VS/exam findings. Will start Magnesium Sulfate infusion due to new onset of severe range BPs  Will observe

## 2017-07-09 NOTE — Progress Notes (Signed)
Labor Progress Note  S: 31 yo G5P0131 at [redacted]w[redacted]d here for induction for gHTN. Patient not feeling any pain from contractions yet. Pitocin at 18, IUPC placed.  O:  BP (!) 153/88   Pulse (!) 58   Temp 98.8 F (37.1 C)   Resp 16   Ht 5\' 2"  (1.575 m)   Wt 90.3 kg (199 lb)   LMP 10/07/2016 (Exact Date)   SpO2 98%   BMI 36.40 kg/m    Cat 1. Baseline FHR 120, mod variability, accels present, no decels Contractions every 2-3 minutes  CVE: Dilation: 4 Effacement (%): 50 Cervical Position: Posterior Station: -2 Presentation: Vertex Exam by:: Everrett Coombe, MD   A&P: 31 y.o. [phone] [redacted]w[redacted]d IOL for gHTN S/p Foley bulb, AROM @ 2000 Continue to increase pit per protocol IUPC placed @ 17:17 Continue expectant management, epidural upon request Anticipate SVD   Salome Arnt, Medical Student 9:26 PM  I saw the patient alongside the medical student. Everrett Coombe, MD PGY-2 [facility]

## 2017-07-09 NOTE — Progress Notes (Signed)
Patient ID: Terri Estes, female   DOB: [birth year], 31 y.o.   MRN: [id number] Labor Progress Note  S: Patient seen & examined for progress of labor. Patient comfortable.   O: BP (!) 155/94 (BP Location: Left Arm)   Pulse (!) 59   Temp 98 F (36.7 C) (Oral)   Resp 16   Ht 5\' 2"  (1.575 m)   Wt 90.3 kg (199 lb)   LMP 10/07/2016 (Exact Date)   BMI 36.40 kg/m   FHT: 110bpm, mod var, +accels, no decels TOCO: irregular min, patient looks comfortable during contractions  CVE: Dilation: 4 Effacement (%): 70, 80 Cervical Position: Posterior Station: -3 Presentation: Vertex Exam by:: Dorinda Hill RN  A&P: 31 y.o. [phone] [redacted]w[redacted]d IOL for GHTN with h/o eclampsia  Currently on 16 milli-units of pitocin Continue current management Anticipate SVD  [place] Marketta Valadez, DO FM Resident PGY-1 07/09/2017 4:51 PM

## 2017-07-09 NOTE — Progress Notes (Signed)
Labor Progress Note  S: 31 yo G5P0131 at [redacted]w[redacted]d induction for gHTN. Patient resting comfortably in bed.  O:  BP (!) 134/98   Pulse 63   Temp 97.8 F (36.6 C)   Resp 18   Ht 5\' 2"  (1.575 m)   Wt 90.3 kg (199 lb)   LMP 10/07/2016 (Exact Date)   BMI 36.40 kg/m    Cat 1. FHR Baseline 125, mod variability, accels present, no decels  CVE: Dilation: 3 Effacement (%): 80 Station: -2 Presentation: Vertex Exam by:: Salena Saner   A&P: 31 y.o. [id number] [redacted]w[redacted]d IOL for gHTN FB out 630 Will restart Pitocin Continue expectant management, epidural upon request Anticipate SVD   Salome Arnt, Medical Student 6:31 AM

## 2017-07-09 NOTE — Progress Notes (Signed)
Patient ID: Terri Estes, female   DOB: 09-May-1986, 31 y.o.   MRN: [id number]  Pt resting; Pit on x 24h BP 131/72, other VSS FHR 120s, +accels, no decels Irreg ctx Cx 2.5cm/ cervical foley in place  IUP@term  TOLAC gHTN IOL process  Will stop Pitocin Leave foley in place until it comes out BPs stable  Serita Grammes CNM 07/09/2017 4:29 AM

## 2017-07-09 NOTE — Progress Notes (Signed)
Patient ID: Terri Estes, female   DOB: 11/15/1986, 31 y.o.   MRN: [id number] Labor Progress Note  S: Patient seen & examined for progress of labor. Patient awaiting epidural.   O: BP 128/82   Pulse 74   Temp 98.2 F (36.8 C) (Oral)   Resp 18   Ht 5\' 2"  (1.575 m)   Wt 90.3 kg (199 lb)   LMP 10/07/2016 (Exact Date)   BMI 36.40 kg/m   FHT: 135bpm, mod var, +accels, no decels TOCO: absent  CVE: Dilation: 3 Effacement (%): 80 Station: -2 Presentation: Vertex Exam by:: Terri Estes  A&P: 31 y.o. [id number] [redacted]w[redacted]d for IOL d/t GHTN, TOLAC with h/o eclampsia at 30 weeks and stat c/s.  Currently on pitocin break. Continue current management Anticipate SVD  [place] Dwayn Moravek, DO [facility] Resident PGY-1 07/09/2017 10:49 AM

## 2017-07-09 NOTE — Progress Notes (Signed)
Patient ID: Terri Estes, female   DOB: 1986-07-08, 31 y.o.   MRN: [id number] Labor Progress Note  S: Patient examined for progress of labor. Sleeping when I was in the room.   O: BP 132/72 (BP Location: Left Arm)   Pulse 66   Temp 98 F (36.7 C) (Oral)   Resp 16   Ht 5\' 2"  (1.575 m)   Wt 90.3 kg (199 lb)   LMP 10/07/2016 (Exact Date)   SpO2 98%   BMI 36.40 kg/m   FHT: 120bpm, mod var, +accels, no decels TOCO: irregular, patient looks comfortable during contractions  CVE: Dilation: 4 Effacement (%): 70, 80 Cervical Position: Posterior Station: -3 Presentation: Vertex Exam by:: Dorinda Hill RN   Results for orders placed or performed during the hospital encounter of 07/07/17 (from the past 24 hour(s))  CBC     Status: Abnormal   Collection Time: 07/09/17 10:21 AM  Result Value Ref Range   WBC 9.3 4.0 - 10.5 K/uL   RBC 3.37 (L) 3.87 - 5.11 MIL/uL   Hemoglobin 10.1 (L) 12.0 - 15.0 g/dL   HCT 30.4 (L) 36.0 - 46.0 %   MCV 90.2 78.0 - 100.0 fL   MCH 30.0 26.0 - 34.0 pg   MCHC 33.2 30.0 - 36.0 g/dL   RDW 15.0 11.5 - 15.5 %   Platelets 142 (L) 150 - 400 K/uL  CBC     Status: Abnormal   Collection Time: 07/09/17  4:33 PM  Result Value Ref Range   WBC 8.8 4.0 - 10.5 K/uL   RBC 3.31 (L) 3.87 - 5.11 MIL/uL   Hemoglobin 10.0 (L) 12.0 - 15.0 g/dL   HCT 30.0 (L) 36.0 - 46.0 %   MCV 90.6 78.0 - 100.0 fL   MCH 30.2 26.0 - 34.0 pg   MCHC 33.3 30.0 - 36.0 g/dL   RDW 14.9 11.5 - 15.5 %   Platelets 136 (L) 150 - 400 K/uL  Comprehensive metabolic panel     Status: Abnormal   Collection Time: 07/09/17  5:42 PM  Result Value Ref Range   Sodium 136 135 - 145 mmol/L   Potassium 3.6 3.5 - 5.1 mmol/L   Chloride 103 101 - 111 mmol/L   CO2 25 22 - 32 mmol/L   Glucose, Bld 84 65 - 99 mg/dL   BUN <5 (L) 6 - 20 mg/dL   Creatinine, Ser 0.74 0.44 - 1.00 mg/dL   Calcium 8.5 (L) 8.9 - 10.3 mg/dL   Total Protein 5.7 (L) 6.5 - 8.1 g/dL   Albumin 2.4 (L) 3.5 - 5.0 g/dL   AST 18 15 - 41  U/L   ALT 10 (L) 14 - 54 U/L   Alkaline Phosphatase 161 (H) 38 - 126 U/L   Total Bilirubin 0.6 0.3 - 1.2 mg/dL   GFR calc non Af Amer >60 >60 mL/min   GFR calc Af Amer >60 >60 mL/min   Anion gap 8 5 - 15  Protein / creatinine ratio, urine     Status: Abnormal   Collection Time: 07/09/17  6:23 PM  Result Value Ref Range   Creatinine, Urine 43.00 mg/dL   Total Protein, Urine 15 mg/dL   Protein Creatinine Ratio 0.35 (H) 0.00 - 0.15 mg/mg[Cre]    A&P: 31 y.o. [id number] [redacted]w[redacted]d for IOL d/t GHTN, h/o eclampsia. P/C 7/12 was .14, now .35.  Currently on 18 milli-units of pitocin Continue current management Anticipate SVD  [place] Aarya Quebedeaux, DO FM Resident PGY-1 07/09/2017 7:30 PM

## 2017-07-10 ENCOUNTER — Encounter (HOSPITAL_COMMUNITY): Payer: Self-pay

## 2017-07-10 ENCOUNTER — Inpatient Hospital Stay (HOSPITAL_COMMUNITY): Payer: Medicaid Other | Admitting: Anesthesiology

## 2017-07-10 ENCOUNTER — Encounter (HOSPITAL_COMMUNITY)
Admission: AD | Disposition: A | Discharge status: Home / Self Care | Payer: Self-pay | Source: Ambulatory Visit | Attending: Obstetrics and Gynecology

## 2017-07-10 DIAGNOSIS — O1414 Severe pre-eclampsia complicating childbirth: Secondary | ICD-10-CM

## 2017-07-10 DIAGNOSIS — O34211 Maternal care for low transverse scar from previous cesarean delivery: Secondary | ICD-10-CM

## 2017-07-10 DIAGNOSIS — Z3A39 39 weeks gestation of pregnancy: Secondary | ICD-10-CM

## 2017-07-10 LAB — COMPREHENSIVE METABOLIC PANEL
ALBUMIN: 2.4 g/dL — AB (ref 3.5–5.0)
ALT: 11 U/L — ABNORMAL LOW (ref 14–54)
ANION GAP: 5 (ref 5–15)
AST: 19 U/L (ref 15–41)
Alkaline Phosphatase: 160 U/L — ABNORMAL HIGH (ref 38–126)
BILIRUBIN TOTAL: 0.5 mg/dL (ref 0.3–1.2)
BUN: <5 mg/dL — ABNORMAL LOW (ref 6–20)
CHLORIDE: 104 mmol/L (ref 101–111)
CO2: 27 mmol/L (ref 22–32)
Calcium: 7.5 mg/dL — ABNORMAL LOW (ref 8.9–10.3)
Creatinine, Ser: 0.7 mg/dL (ref 0.44–1.00)
GFR calc Af Amer: >60 mL/min (ref >60)
GFR calc non Af Amer: >60 mL/min (ref >60)
GLUCOSE: 93 mg/dL (ref 65–99)
POTASSIUM: 3.5 mmol/L (ref 3.5–5.1)
SODIUM: 136 mmol/L (ref 135–145)
Total Protein: 6 g/dL — ABNORMAL LOW (ref 6.5–8.1)

## 2017-07-10 LAB — CBC
HEMATOCRIT: 29.5 % — AB (ref 36.0–46.0)
Hemoglobin: 10 g/dL — ABNORMAL LOW (ref 12.0–15.0)
MCH: 30.4 pg (ref 26.0–34.0)
MCHC: 33.9 g/dL (ref 30.0–36.0)
MCV: 89.7 fL (ref 78.0–100.0)
PLATELETS: 139 10*3/uL — AB (ref 150–400)
RBC: 3.29 MIL/uL — ABNORMAL LOW (ref 3.87–5.11)
RDW: 14.9 % (ref 11.5–15.5)
WBC: 8.6 10*3/uL (ref 4.0–10.5)

## 2017-07-10 LAB — MAGNESIUM: Magnesium: 5 mg/dL — ABNORMAL HIGH (ref 1.7–2.4)

## 2017-07-10 LAB — CORD BLOOD GAS (ARTERIAL)

## 2017-07-10 SURGERY — Surgical Case
Anesthesia: Epidural

## 2017-07-10 MED ORDER — LACTATED RINGERS IV SOLN: INTRAVENOUS | Status: DC

## 2017-07-10 MED ORDER — KETOROLAC TROMETHAMINE 30 MG/ML IJ SOLN
INTRAMUSCULAR | Status: AC
  Filled 2017-07-10: qty 1

## 2017-07-10 MED ORDER — METOCLOPRAMIDE HCL 5 MG/ML IJ SOLN: 10.0000 mg | Freq: Once | INTRAMUSCULAR | Status: DC | PRN

## 2017-07-10 MED ORDER — LACTATED RINGERS IV SOLN
INTRAVENOUS | Status: DC | PRN
  Administered 2017-07-10: 21:00:00 via INTRAVENOUS

## 2017-07-10 MED ORDER — KETOROLAC TROMETHAMINE 30 MG/ML IJ SOLN
30.0000 mg | Freq: Three times a day (TID) | INTRAMUSCULAR | Status: DC
  Administered 2017-07-11: 30 mg via INTRAVENOUS
  Filled 2017-07-10: qty 1

## 2017-07-10 MED ORDER — SODIUM BICARBONATE 8.4 % IV SOLN
INTRAVENOUS | Status: DC | PRN
  Administered 2017-07-10 (×2): 5 mL via EPIDURAL

## 2017-07-10 MED ORDER — PHENYLEPHRINE HCL 10 MG/ML IJ SOLN
INTRAMUSCULAR | Status: DC | PRN
  Administered 2017-07-10 (×2): 80 ug via INTRAVENOUS

## 2017-07-10 MED ORDER — KETOROLAC TROMETHAMINE 30 MG/ML IJ SOLN
INTRAMUSCULAR | Status: AC
  Administered 2017-07-10: 30 mg via INTRAMUSCULAR
  Filled 2017-07-10: qty 1

## 2017-07-10 MED ORDER — CEFAZOLIN SODIUM-DEXTROSE 2-3 GM-% IV SOLR
INTRAVENOUS | Status: DC | PRN
  Administered 2017-07-10: 2 g via INTRAVENOUS

## 2017-07-10 MED ORDER — OXYTOCIN 10 UNIT/ML IJ SOLN
INTRAMUSCULAR | Status: AC
  Filled 2017-07-10: qty 4

## 2017-07-10 MED ORDER — KETOROLAC TROMETHAMINE 30 MG/ML IJ SOLN
INTRAMUSCULAR | Status: DC | PRN
  Administered 2017-07-10: 30 mg via INTRAVENOUS

## 2017-07-10 MED ORDER — LIDOCAINE-EPINEPHRINE (PF) 2 %-1:200000 IJ SOLN
INTRAMUSCULAR | Status: DC | PRN
  Administered 2017-07-10 (×2): 5 mL via INTRADERMAL

## 2017-07-10 MED ORDER — CEFAZOLIN SODIUM-DEXTROSE 2-4 GM/100ML-% IV SOLN
INTRAVENOUS | Status: AC
  Filled 2017-07-10: qty 100

## 2017-07-10 MED ORDER — BUPRENORPHINE HCL 2 MG SL SUBL: 4.0000 mg | SUBLINGUAL_TABLET | Freq: Once | SUBLINGUAL | Status: DC | PRN

## 2017-07-10 MED ORDER — MEPERIDINE HCL 25 MG/ML IJ SOLN: 6.2500 mg | INTRAMUSCULAR | Status: DC | PRN

## 2017-07-10 MED ORDER — ONDANSETRON HCL 4 MG/2ML IJ SOLN
INTRAMUSCULAR | Status: AC
  Filled 2017-07-10: qty 2

## 2017-07-10 MED ORDER — LIDOCAINE HCL (PF) 1 % IJ SOLN
INTRAMUSCULAR | Status: DC | PRN
  Administered 2017-07-10 (×2): 5 mL

## 2017-07-10 SURGICAL SUPPLY — 40 items
CHLORAPREP W/TINT 26ML (MISCELLANEOUS) ×6 IMPLANT
CLAMP CORD UMBIL (MISCELLANEOUS) IMPLANT
CLOTH BEACON ORANGE TIMEOUT ST (SAFETY) ×3 IMPLANT
DERMABOND ADVANCED (GAUZE/BANDAGES/DRESSINGS) ×2
DERMABOND ADVANCED .7 DNX12 (GAUZE/BANDAGES/DRESSINGS) ×1 IMPLANT
DRSG OPSITE POSTOP 4X10 (GAUZE/BANDAGES/DRESSINGS) ×3 IMPLANT
ELECT REM PT RETURN 9FT ADLT (ELECTROSURGICAL) ×3
ELECTRODE REM PT RTRN 9FT ADLT (ELECTROSURGICAL) ×1 IMPLANT
EXTRACTOR VACUUM BELL STYLE (SUCTIONS) IMPLANT
GLOVE BIOGEL PI IND STRL 7.0 (GLOVE) ×1 IMPLANT
GLOVE BIOGEL PI IND STRL 8 (GLOVE) ×1 IMPLANT
GLOVE BIOGEL PI INDICATOR 7.0 (GLOVE) ×2
GLOVE BIOGEL PI INDICATOR 8 (GLOVE) ×2
GLOVE ECLIPSE 8.0 STRL XLNG CF (GLOVE) ×3 IMPLANT
GOWN STRL REUS W/TWL LRG LVL3 (GOWN DISPOSABLE) ×6 IMPLANT
HOVERMATT SINGLE USE (MISCELLANEOUS) ×3 IMPLANT
KIT ABG SYR 3ML LUER SLIP (SYRINGE) ×3 IMPLANT
NEEDLE HYPO 18GX1.5 BLUNT FILL (NEEDLE) ×3 IMPLANT
NEEDLE HYPO 22GX1.5 SAFETY (NEEDLE) ×3 IMPLANT
NEEDLE HYPO 25X5/8 SAFETYGLIDE (NEEDLE) ×3 IMPLANT
NS IRRIG 1000ML POUR BTL (IV SOLUTION) ×3 IMPLANT
PACK C SECTION WH (CUSTOM PROCEDURE TRAY) ×3 IMPLANT
PAD ABD DERMACEA PRESS 5X9 (GAUZE/BANDAGES/DRESSINGS) ×3 IMPLANT
PAD OB MATERNITY 4.3X12.25 (PERSONAL CARE ITEMS) ×3 IMPLANT
PENCIL SMOKE EVAC W/HOLSTER (ELECTROSURGICAL) ×3 IMPLANT
RTRCTR C-SECT PINK 25CM LRG (MISCELLANEOUS) IMPLANT
SPONGE GAUZE 4X4 12PLY (GAUZE/BANDAGES/DRESSINGS) ×3 IMPLANT
SUT CHROMIC 0 CT 1 (SUTURE) ×3 IMPLANT
SUT MNCRL 0 VIOLET CTX 36 (SUTURE) ×2 IMPLANT
SUT MONOCRYL 0 CTX 36 (SUTURE) ×4
SUT PLAIN 2 0 (SUTURE)
SUT PLAIN 2 0 XLH (SUTURE) IMPLANT
SUT PLAIN ABS 2-0 CT1 27XMFL (SUTURE) IMPLANT
SUT VIC AB 0 CTX 36 (SUTURE) ×2
SUT VIC AB 0 CTX36XBRD ANBCTRL (SUTURE) ×1 IMPLANT
SUT VIC AB 4-0 KS 27 (SUTURE) IMPLANT
SYR 20CC LL (SYRINGE) ×6 IMPLANT
TAPE CLOTH SURG 4X10 WHT LF (GAUZE/BANDAGES/DRESSINGS) ×3 IMPLANT
TOWEL OR 17X24 6PK STRL BLUE (TOWEL DISPOSABLE) ×3 IMPLANT
TRAY FOLEY BAG SILVER LF 14FR (SET/KITS/TRAYS/PACK) IMPLANT

## 2017-07-10 NOTE — Anesthesia Preprocedure Evaluation (Signed)
Anesthesia Evaluation  Patient identified by MRN, date of birth, ID band Patient awake    Reviewed: Allergy & Precautions, NPO status , Patient's Chart, lab work & pertinent test results  Airway Mallampati: II  TM Distance: >3 FB Neck ROM: Full    Dental no notable dental hx.    Pulmonary neg pulmonary ROS, Current Smoker,    Pulmonary exam normal breath sounds clear to auscultation       Cardiovascular hypertension, Pt. 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                             Anesthesia Physical Anesthesia Plan  ASA: II  Anesthesia Plan: Epidural   Post-op Pain Management:    Induction:   PONV Risk Score and Plan:   Airway Management Planned:   Additional Equipment:   Intra-op Plan:   Post-operative Plan:   Informed Consent: I have reviewed the patients History and Physical, chart, labs and discussed the procedure including the risks, benefits and alternatives for the proposed anesthesia with the patient or authorized representative who has indicated his/her understanding and acceptance.     Plan Discussed with:   Anesthesia Plan Comments:         Anesthesia Quick Evaluation

## 2017-07-10 NOTE — Op Note (Signed)
Preoperative diagnosis:  1.  Intrauterine pregnancy at [redacted]w[redacted]d  weeks gestation                                         2.  Pre eclampsia, severe features, BP                                         3.  Previous Caesarean section                                         4.  Arrest of dilatation and descent                                         5.  Refused C section for many hours   Postoperative diagnosis:  Same as above plus fetal right eyelid was flipped and edematous  Procedure:  Repeat cesarean section  Surgeon:  Florian Buff MD  Assistant:    Anesthesia: Spinal  Findings:  Pt was admitted for cervical ripening and induction of labor due to [facility] which hs evolved into pre eclampsia with severe features, BP , who has been unchanged for just over 24 hours but since this am refused Casearean section due to childcare issues with her disabled child at home.  We even cut pitocin off twice and restarted to try to positively affect her labor progress.  The patient finally decided to proceed again 4 cm for >24 hours  Over a low transverse incision was delivered a viable female with Apgars of 8 and 8 weighing 6 lbs. 10 oz. Uterus, tubes and ovaries were all normal.  There were no other significant findings findings.  The baby was born with a flipped out right eyelid with edema.  He will be evaluated by a pediatric opthamologist.  Description of operation:  Patient was taken to the operating room and placed in the sitting position where she underwent a spinal anesthetic. She was then placed in the supine position with tilt to the left side. When adequate anesthetic level was obtained she was prepped and draped in usual sterile fashion and a Foley catheter was placed. A Pfannenstiel skin incision was made and carried down sharply to the rectus fascia which was scored in the midline extended laterally. The fascia was taken off the muscles both superiorly and without difficulty. The muscles were divided.   The peritoneal cavity was entered.  Bladder blade was placed, no bladder flap was created.  A low transverse hysterotomy incision was made and delivered a viable female  infant at 2116 with Apgars of 8 and 8 weighing6 lbs 10 oz.  Cord pH was obtained and was 7.27. The uterus was exteriorized. It was closed in 2 layers, the first being a running interlocking layer and the second being an imbricating layer using 0 monocryl on a CTX needle. There was good resulting hemostasis. The uterus tubes and ovaries were all normal. Peritoneal cavity was irrigated vigorously. The muscles and peritoneum were reapproximated loosely. The fascia was closed using 0 Vicryl in running fashion. Subcutaneous tissue was made hemostatic and irrigated. The  skin was closed using 4-0 Vicryl on a Keith needle in a subcuticular fashion.  Dermabond was placed for additional wound integrity and to serve as a barrier. Blood loss for the procedure was 500 cc. The patient received 2 gram of Ancef prophylactically. The patient was taken to the recovery room in good stable condition with all counts being correct x3.  EBL 500 cc  Burkley Dech H 07/10/2017 9:50 PM

## 2017-07-10 NOTE — Anesthesia Procedure Notes (Signed)
Epidural Patient location during procedure: OB  Staffing Anesthesiologist: Yaritza Leist Performed: anesthesiologist   Preanesthetic Checklist Completed: patient identified, site marked, surgical consent, pre-op evaluation, timeout performed, IV checked, risks and benefits discussed and monitors and equipment checked  Epidural Patient position: sitting Prep: DuraPrep Patient monitoring: heart rate, continuous pulse ox and blood pressure Approach: right paramedian Location: L3-L4 Injection technique: LOR saline  Needle:  Needle type: Tuohy  Needle gauge: 17 G Needle length: 9 cm and 9 Needle insertion depth: 6 cm Catheter type: closed end flexible Catheter size: 20 Guage Catheter at skin depth: 10 cm Test dose: negative  Assessment Events: blood not aspirated, injection not painful, no injection resistance, negative IV test and no paresthesia  Additional Notes Patient identified. Risks/Benefits/Options discussed with patient including but not limited to bleeding, infection, nerve damage, paralysis, failed block, incomplete pain control, headache, blood pressure changes, nausea, vomiting, reactions to medication both or allergic, itching and postpartum back pain. Confirmed with bedside nurse the patient's most recent platelet count. Confirmed with patient that they are not currently taking any anticoagulation, have any bleeding history or any family history of bleeding disorders. Patient expressed understanding and wished to proceed. All questions were answered. Sterile technique was used throughout the entire procedure. Please see nursing notes for vital signs. Test dose was given through epidural needle and negative prior to continuing to dose epidural or start infusion. Warning signs of high block given to the patient including shortness of breath, tingling/numbness in hands, complete motor block, or any concerning symptoms with instructions to call for help. Patient was given  instructions on fall risk and not to get out of bed. All questions and concerns addressed with instructions to call with any issues.     
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
## 2017-07-10 NOTE — Progress Notes (Signed)
Terri Estes is a 31 y.o. [phone] at [redacted]w[redacted]d by ultrasound admitted for induction of labor due to Pre-eclamptic toxemia of pregnancy..  Subjective:   Objective: BP 116/77 (BP Location: Left Arm)   Pulse 81   Temp 98 F (36.7 C) (Oral)   Resp 16   Ht 5\' 2"  (1.575 m)   Wt 199 lb (90.3 kg)   LMP 10/07/2016 (Exact Date)   SpO2 98%   BMI 36.40 kg/m  I/O last 3 completed shifts: In: 1257.9 [P.O.:360; I.V.:897.9] Out: 2525 [Urine:2525] Total I/O In: 2518.2 [P.O.:1220; I.V.:1298.2] Out: 1325 [Urine:1325]  FHT:  FHR: 115-120 bpm, variability: moderate,  accelerations:  Present,  decelerations:  Absent UC:   Mild uc's 4-6 min apart SVE:   Dilation: 4 Effacement (%): 60 Station: -2 Exam by:: Dorinda Hill RN  Labs: Lab Results  Component Value Date   WBC 8.8 07/09/2017   HGB 10.0 (L) 07/09/2017   HCT 30.0 (L) 07/09/2017   MCV 90.6 07/09/2017   PLT 136 (L) 07/09/2017    Assessment / Plan: Induction of labor due to preeclampsia,  progressing well on pitocin  Labor: yet to be in labor Preeclampsia:  on magnesium sulfate Fetal Wellbeing:  Category I Pain Control:  Labor support without medications I/D:  n/a Anticipated MOD:  NSVD  Koren Shiver 07/10/2017, 3:41 PM

## 2017-07-10 NOTE — Transfer of Care (Signed)
Immediate Anesthesia Transfer of Care Note  Patient: Terri Estes  Procedure(s) Performed: Procedure(s): CESAREAN SECTION (N/A)  Patient Location: PACU  Anesthesia Type:Epidural  Level of Consciousness: awake, alert  and oriented  Airway & Oxygen Therapy: Patient Spontanous Breathing  Post-op Assessment: Report given to RN and Post -op Vital signs reviewed and stable  Post vital signs: Reviewed and stable  Last Vitals:  Vitals:   07/10/17 2030 07/10/17 2035  BP: (!) 158/92 (!) 155/92  Pulse: 72 68  Resp:    Temp:      Last Pain:  Vitals:   07/10/17 1930  TempSrc: Axillary  PainSc: 0-No pain      Patients Stated Pain Goal: 4 (44/45/84 8350)  Complications: No apparent anesthesia complications

## 2017-07-10 NOTE — Progress Notes (Signed)
Per MD Eure (in department), PIT break for 4 hours. Restart pitocin at 2.

## 2017-07-10 NOTE — Progress Notes (Signed)
Terri Estes is a 31 y.o. [phone] at [redacted]w[redacted]d by ultrasound admitted for induction of labor due to Pre-eclamptic toxemia of pregnancy..  Subjective:   Objective: BP (!) 159/93   Pulse 88   Temp 98 F (36.7 C) (Oral)   Resp 16   Ht 5\' 2"  (1.575 m)   Wt 199 lb (90.3 kg)   LMP 10/07/2016 (Exact Date)   SpO2 98%   BMI 36.40 kg/m  I/O last 3 completed shifts: In: 1257.9 [P.O.:360; I.V.:897.9] Out: 2525 [Urine:2525] Total I/O In: 3607.5 [P.O.:1820; I.V.:1787.5] Out: 9371 [Urine:3425]  FHT:  FHR: 115 bpm, variability: moderate,  accelerations:  Present,  decelerations:  Absent UC:   regular, every 4-5 minutes SVE:   Dilation: 4 Effacement (%): 60 Station: -2 Exam by:: Dorinda Hill RN  Labs: Lab Results  Component Value Date   WBC 8.6 07/10/2017   HGB 10.0 (L) 07/10/2017   HCT 29.5 (L) 07/10/2017   MCV 89.7 07/10/2017   PLT 139 (L) 07/10/2017    Assessment / Plan: Induction of labor due to preeclampsia,  progressing well on pitocin  Labor: Progressing normally Preeclampsia:  on magnesium sulfate Fetal Wellbeing:  Category I Pain Control:  Labor support without medications I/D:  n/a Anticipated MOD:  NSVD  Koren Shiver 07/10/2017, 6:22 PM

## 2017-07-10 NOTE — Progress Notes (Addendum)
Karalina Tift is a 31 y.o. [phone] at [redacted]w[redacted]d by ultrasound admitted for induction of labor due to Pre-eclamptic toxemia of pregnancy..  Subjective:   Objective: BP (!) 141/93 (BP Location: Left Arm)   Pulse 90   Temp 98.5 F (36.9 C) (Oral)   Resp (!) 24   Ht 5\' 2"  (1.575 m)   Wt 199 lb (90.3 kg)   LMP 10/07/2016 (Exact Date)   SpO2 98%   BMI 36.40 kg/m  I/O last 3 completed shifts: In: 1257.9 [P.O.:360; I.V.:897.9] Out: 2525 [Urine:2525] Total I/O In: 1022.3 [P.O.:480; I.V.:542.3] Out: 0   FHT:  FHR: 120 bpm, variability: moderate,  accelerations:  Present,  decelerations:  Absent UC:   none SVE:   Dilation: 4 Effacement (%): 50 Station: -2 Exam by:: Benjamine Mola Mumaw,,MD  Labs: Lab Results  Component Value Date   WBC 8.8 07/09/2017   HGB 10.0 (L) 07/09/2017   HCT 30.0 (L) 07/09/2017   MCV 90.6 07/09/2017   PLT 136 (L) 07/09/2017    Assessment / Plan: yet to be in labor. pit break per Dr. Elonda Husky  Labor: yet to be in labor Preeclampsia:  on magnesium sulfate Fetal Wellbeing:  Category I Pain Control:  anticipate epidural I/D:  n/a Anticipated MOD:  NSVD  Koren Shiver 07/10/2017, 10:20 AM

## 2017-07-10 NOTE — Progress Notes (Signed)
Labor Progress Note  Terri Estes is a 31 y.o. [phone] at [redacted]w[redacted]d  admitted for IOL for gHTN, developed preE. BPS elevated in severe range overnight and patient started on hypertension protocol/mag started at 2245.  This morning patient is resting comfortably.  FHT:  120 bmp, mod var, accels, no decels UC:   q24m   SVE:   Dilation: 4 Effacement (%): 50 Station: -2  Pitocin @ 30  mu/min  Vitals:   07/10/17 0631 07/10/17 0701  BP: (!) 146/89 138/86  Pulse: 87 87  Resp: 16 18  Temp: 98 F (36.7 C)      Labs: Lab Results  Component Value Date   WBC 8.8 07/09/2017   HGB 10.0 (L) 07/09/2017   HCT 30.0 (L) 07/09/2017   MCV 90.6 07/09/2017   PLT 136 (L) 07/09/2017    Assessment / Plan: Labor: Progressing on Pitocin, will continue to increase then AROM Fetal Wellbeing:  Category I Pain Control:  IV pain meds Anticipated MOD:  NSVD  Expectant management  Terri Coombe, MD PGY-2 [facility]

## 2017-07-10 NOTE — Progress Notes (Signed)
Patient ID: Terri Estes, female   DOB: 02/29/1986, 29 y.o.   MRN: [id number] Has decided to proceed with C/S Vitals:   07/10/17 1855 07/10/17 1901 07/10/17 1930 07/10/17 2000  BP: (!) 158/95 (!) 143/102 (!) 141/89 (!) 143/94  Pulse: 76 78 71 68  Resp: 18 18 20    Temp: 97.9 F (36.6 C)  97.7 F (36.5 C)   TempSrc: Oral  Axillary   SpO2:      Weight:      Height:       Dr Elonda Husky in to discuss  Prep for OR

## 2017-07-11 ENCOUNTER — Encounter (HOSPITAL_COMMUNITY): Payer: Self-pay | Admitting: Obstetrics & Gynecology

## 2017-07-11 ENCOUNTER — Encounter: Payer: Medicaid Other | Admitting: Obstetrics

## 2017-07-11 LAB — CBC
HCT: 29.3 % — ABNORMAL LOW (ref 36.0–46.0)
HEMATOCRIT: 25.5 % — AB (ref 36.0–46.0)
HEMOGLOBIN: 9.8 g/dL — AB (ref 12.0–15.0)
Hemoglobin: 8.8 g/dL — ABNORMAL LOW (ref 12.0–15.0)
MCH: 30.2 pg (ref 26.0–34.0)
MCH: 30.8 pg (ref 26.0–34.0)
MCHC: 33.4 g/dL (ref 30.0–36.0)
MCHC: 34.5 g/dL (ref 30.0–36.0)
MCV: 89.2 fL (ref 78.0–100.0)
MCV: 90.4 fL (ref 78.0–100.0)
Platelets: 121 10*3/uL — ABNORMAL LOW (ref 150–400)
Platelets: 141 10*3/uL — ABNORMAL LOW (ref 150–400)
RBC: 2.86 MIL/uL — ABNORMAL LOW (ref 3.87–5.11)
RBC: 3.24 MIL/uL — AB (ref 3.87–5.11)
RDW: 14.9 % (ref 11.5–15.5)
RDW: 15 % (ref 11.5–15.5)
WBC: 9 10*3/uL (ref 4.0–10.5)
WBC: 9.7 10*3/uL (ref 4.0–10.5)

## 2017-07-11 LAB — COMPREHENSIVE METABOLIC PANEL
ALBUMIN: 2 g/dL — AB (ref 3.5–5.0)
ALK PHOS: 137 U/L — AB (ref 38–126)
ALT: 10 U/L — ABNORMAL LOW (ref 14–54)
ANION GAP: 4 — AB (ref 5–15)
AST: 20 U/L (ref 15–41)
BILIRUBIN TOTAL: 0.7 mg/dL (ref 0.3–1.2)
CALCIUM: 7 mg/dL — AB (ref 8.9–10.3)
CO2: 29 mmol/L (ref 22–32)
CREATININE: 0.72 mg/dL (ref 0.44–1.00)
Chloride: 103 mmol/L (ref 101–111)
GFR calc Af Amer: >60 mL/min (ref >60)
GFR calc non Af Amer: >60 mL/min (ref >60)
GLUCOSE: 88 mg/dL (ref 65–99)
Potassium: 3.5 mmol/L (ref 3.5–5.1)
Sodium: 136 mmol/L (ref 135–145)
TOTAL PROTEIN: 4.8 g/dL — AB (ref 6.5–8.1)

## 2017-07-11 LAB — MAGNESIUM: Magnesium: 5.5 mg/dL — ABNORMAL HIGH (ref 1.7–2.4)

## 2017-07-11 MED ORDER — PANTOPRAZOLE SODIUM 40 MG PO TBEC
40.0000 mg | DELAYED_RELEASE_TABLET | Freq: Every day | ORAL | Status: DC
  Administered 2017-07-11 – 2017-07-12 (×2): 40 mg via ORAL
  Filled 2017-07-11 (×2): qty 1

## 2017-07-11 MED ORDER — SIMETHICONE 80 MG PO CHEW
80.0000 mg | CHEWABLE_TABLET | ORAL | Status: DC
  Administered 2017-07-11 – 2017-07-12 (×2): 80 mg via ORAL
  Filled 2017-07-11 (×2): qty 1

## 2017-07-11 MED ORDER — METHYLERGONOVINE MALEATE 0.2 MG PO TABS: 0.2000 mg | ORAL_TABLET | ORAL | Status: DC | PRN

## 2017-07-11 MED ORDER — DIBUCAINE 1 % RE OINT: 1.0000 "application " | TOPICAL_OINTMENT | RECTAL | Status: DC | PRN

## 2017-07-11 MED ORDER — METHYLERGONOVINE MALEATE 0.2 MG/ML IJ SOLN: 0.2000 mg | INTRAMUSCULAR | Status: DC | PRN

## 2017-07-11 MED ORDER — SERTRALINE HCL 50 MG PO TABS
50.0000 mg | ORAL_TABLET | Freq: Every day | ORAL | Status: DC
  Administered 2017-07-12: 50 mg via ORAL
  Filled 2017-07-11 (×3): qty 1

## 2017-07-11 MED ORDER — TETANUS-DIPHTH-ACELL PERTUSSIS 5-2.5-18.5 LF-MCG/0.5 IM SUSP: 0.5000 mL | Freq: Once | INTRAMUSCULAR | Status: DC

## 2017-07-11 MED ORDER — SENNOSIDES-DOCUSATE SODIUM 8.6-50 MG PO TABS
2.0000 | ORAL_TABLET | ORAL | Status: DC
  Administered 2017-07-11 – 2017-07-12 (×2): 2 via ORAL
  Filled 2017-07-11 (×2): qty 2

## 2017-07-11 MED ORDER — BUPRENORPHINE HCL 8 MG SL SUBL
8.0000 mg | SUBLINGUAL_TABLET | Freq: Every day | SUBLINGUAL | Status: DC
  Administered 2017-07-11 – 2017-07-12 (×2): 8 mg via SUBLINGUAL
  Filled 2017-07-11 (×2): qty 1

## 2017-07-11 MED ORDER — LACTATED RINGERS IV SOLN
INTRAVENOUS | Status: DC
  Administered 2017-07-11: 16:00:00 via INTRAVENOUS

## 2017-07-11 MED ORDER — LORAZEPAM 1 MG PO TABS
1.0000 mg | ORAL_TABLET | Freq: Three times a day (TID) | ORAL | Status: DC
  Administered 2017-07-11 – 2017-07-12 (×3): 1 mg via ORAL
  Filled 2017-07-11 (×3): qty 1

## 2017-07-11 MED ORDER — ALBUTEROL SULFATE (2.5 MG/3ML) 0.083% IN NEBU: 3.0000 mL | INHALATION_SOLUTION | Freq: Four times a day (QID) | RESPIRATORY_TRACT | Status: DC | PRN

## 2017-07-11 MED ORDER — ALBUTEROL SULFATE (2.5 MG/3ML) 0.083% IN NEBU
3.0000 mL | INHALATION_SOLUTION | Freq: Four times a day (QID) | RESPIRATORY_TRACT | Status: DC
  Administered 2017-07-11: 3 mL via RESPIRATORY_TRACT
  Filled 2017-07-11: qty 3

## 2017-07-11 MED ORDER — SIMETHICONE 80 MG PO CHEW: 80.0000 mg | CHEWABLE_TABLET | ORAL | Status: DC | PRN

## 2017-07-11 MED ORDER — OXYTOCIN 40 UNITS IN LACTATED RINGERS INFUSION - SIMPLE MED: 2.5000 [IU]/h | INTRAVENOUS | Status: AC

## 2017-07-11 MED ORDER — BUPRENORPHINE HCL 8 MG SL SUBL
4.0000 mg | SUBLINGUAL_TABLET | Freq: Every day | SUBLINGUAL | Status: DC
  Administered 2017-07-11 (×2): 4 mg via SUBLINGUAL
  Filled 2017-07-11 (×2): qty 1

## 2017-07-11 MED ORDER — PRENATAL MULTIVITAMIN CH
1.0000 | ORAL_TABLET | Freq: Every day | ORAL | Status: DC
  Administered 2017-07-11 – 2017-07-12 (×2): 1 via ORAL
  Filled 2017-07-11 (×2): qty 1

## 2017-07-11 MED ORDER — DIPHENHYDRAMINE HCL 25 MG PO CAPS: 25.0000 mg | ORAL_CAPSULE | Freq: Four times a day (QID) | ORAL | Status: DC | PRN

## 2017-07-11 MED ORDER — LURASIDONE HCL 40 MG PO TABS
40.0000 mg | ORAL_TABLET | Freq: Every day | ORAL | Status: DC
  Administered 2017-07-11 (×2): 40 mg via ORAL
  Filled 2017-07-11 (×3): qty 1

## 2017-07-11 MED ORDER — IBUPROFEN 600 MG PO TABS
600.0000 mg | ORAL_TABLET | Freq: Four times a day (QID) | ORAL | Status: DC
  Administered 2017-07-11 – 2017-07-12 (×5): 600 mg via ORAL
  Filled 2017-07-11 (×5): qty 1

## 2017-07-11 MED ORDER — ACETAMINOPHEN 325 MG PO TABS: 650.0000 mg | ORAL_TABLET | ORAL | Status: DC | PRN

## 2017-07-11 MED ORDER — MENTHOL 3 MG MT LOZG: 1.0000 | LOZENGE | OROMUCOSAL | Status: DC | PRN

## 2017-07-11 MED ORDER — LACTATED RINGERS IV SOLN
INTRAVENOUS | Status: DC
  Administered 2017-07-11: 05:00:00 via INTRAVENOUS

## 2017-07-11 MED ORDER — SIMETHICONE 80 MG PO CHEW
80.0000 mg | CHEWABLE_TABLET | Freq: Three times a day (TID) | ORAL | Status: DC
  Administered 2017-07-11 – 2017-07-12 (×5): 80 mg via ORAL
  Filled 2017-07-11 (×6): qty 1

## 2017-07-11 MED ORDER — WITCH HAZEL-GLYCERIN EX PADS: 1.0000 "application " | MEDICATED_PAD | CUTANEOUS | Status: DC | PRN

## 2017-07-11 MED ORDER — LORAZEPAM 2 MG/ML IJ SOLN
1.0000 mg | Freq: Once | INTRAMUSCULAR | Status: AC
  Administered 2017-07-11: 1 mg via INTRAVENOUS
  Filled 2017-07-11: qty 0.5

## 2017-07-11 MED ORDER — COCONUT OIL OIL: 1.0000 "application " | TOPICAL_OIL | Status: DC | PRN

## 2017-07-11 MED ORDER — LORAZEPAM 1 MG PO TABS: 1.0000 mg | ORAL_TABLET | Freq: Once | ORAL | Status: AC

## 2017-07-11 MED ORDER — ZOLPIDEM TARTRATE 5 MG PO TABS: 5.0000 mg | ORAL_TABLET | Freq: Every evening | ORAL | Status: DC | PRN

## 2017-07-11 NOTE — Progress Notes (Signed)
Subjective: Postpartum Day 1: Cesarean Delivery, pre eclampsia with severe features on Magnesium Patient reports doing ok with her pain she is motivated to not increase her subutex dosing Will use ativan to help pain management augmentation Baby's eye is less swollen, pediatric opthamologist to see.    Objective: Vital signs in last 24 hours: Temp:  [97.3 F (36.3 C)-98.5 F (36.9 C)] 98.3 F (36.8 C) (07/16 0500) Pulse Rate:  [57-95] 69 (07/16 0500) Resp:  [15-24] 18 (07/16 0600) BP: (116-160)/(76-104) 127/83 (07/16 0500) SpO2:  [95 %-100 %] 95 % (07/16 0500)  Physical Exam:  General: alert, cooperative and no distress Lochia: appropriate Uterine Fundus: firm Incision: dressing is dry pressure dressing is in place DVT Evaluation: No evidence of DVT seen on physical exam.    Recent Labs  07/11/17 0000 07/11/17 0518  HGB 9.8* 8.8*  HCT 29.3* 25.5*   CBC Latest Ref Rng & Units 07/11/2017 07/11/2017 07/10/2017  WBC 4.0 - 10.5 K/uL 9.0 9.7 8.6  Hemoglobin 12.0 - 15.0 g/dL 8.8(L) 9.8(L) 10.0(L)  Hematocrit 36.0 - 46.0 % 25.5(L) 29.3(L) 29.5(L)  Platelets 150 - 400 K/uL 121(L) 141(L) 139(L)   CMP Latest Ref Rng & Units 07/11/2017 07/10/2017 07/09/2017  Glucose 65 - 99 mg/dL 88 93 84  BUN 6 - 20 mg/dL <5(L) <5(L) <5(L)  Creatinine 0.44 - 1.00 mg/dL 0.72 0.70 0.74  Sodium 135 - 145 mmol/L 136 136 136  Potassium 3.5 - 5.1 mmol/L 3.5 3.5 3.6  Chloride 101 - 111 mmol/L 103 104 103  CO2 22 - 32 mmol/L 29 27 25   Calcium 8.9 - 10.3 mg/dL 7.0(L) 7.5(L) 8.5(L)  Total Protein 6.5 - 8.1 g/dL 4.8(L) 6.0(L) 5.7(L)  Total Bilirubin 0.3 - 1.2 mg/dL 0.7 0.5 0.6  Alkaline Phos 38 - 126 U/L 137(H) 160(H) 161(H)  AST 15 - 41 U/L 20 19 18   ALT 14 - 54 U/L 10(L) 11(L) 10(L)       Assessment/Plan: Status post Cesarean section + pre eclampsia with severe features, BP Post delivery her BP is doing well no additional IV med doses Magnesium continues until 2200 UOP is excellent, will remove at  1200 Pain is doing ok on the ATC toradol and her regular subutex dosing, if she requires pain meds will give intermittent doses of subutex 4 mg to augment ATC ativan to help augment her pain med requirements.  Cheryl Chay H 07/11/2017, 7:24 AM

## 2017-07-11 NOTE — Anesthesia Postprocedure Evaluation (Signed)
Anesthesia Post Note  Patient: Terri Estes  Procedure(s) Performed: Procedure(s) (LRB): CESAREAN SECTION (N/A)     Patient location during evaluation: Mother Baby Anesthesia Type: Epidural Level of consciousness: awake and alert and oriented Pain management: pain level controlled Vital Signs Assessment: post-procedure vital signs reviewed and stable Respiratory status: spontaneous breathing and nonlabored ventilation Cardiovascular status: stable Postop Assessment: no headache, no backache, patient able to bend at knees, no signs of nausea or vomiting and adequate PO intake Anesthetic complications: no    Last Vitals:  Vitals:   07/11/17 0700 07/11/17 0750  BP:  126/83  Pulse:  65  Resp: 18 18  Temp:  36.7 C    Last Pain:  Vitals:   07/11/17 0750  TempSrc: Oral  PainSc:    Pain Goal: Patients Stated Pain Goal: 3 (07/11/17 0500)               Willa Rough

## 2017-07-11 NOTE — Lactation Note (Signed)
This note was copied from a baby's chart. Lactation Consultation Note  Patient Name: [name] Today's Date: 07/11/2017   Attempted to consult with mom, but soundly asleep. Attempted to visit again, and mom states that she is too tired and wants to eat at this time. Enc mom to call when ready for assistance. Mom states that she has only pumped once, but she has been able to put baby to breast. However, she does not think that the baby "got anything." Discussed progression of milk coming to volume and supply and demand. Discussed the benefits of having baby at breast, and the need to pump every 2-3 hours for a total of 8-12 times/24 hours followed by hand expression. Enc mom to call when ready for assistance.   Maternal Data    Feeding    LATCH Score/Interventions                      Lactation Tools Discussed/Used     Consult Status      Terri Estes 07/11/2017, 11:21 AM

## 2017-07-12 ENCOUNTER — Encounter (HOSPITAL_COMMUNITY): Payer: Self-pay

## 2017-07-12 MED ORDER — IBUPROFEN 600 MG PO TABS: 600.0000 mg | ORAL_TABLET | Freq: Four times a day (QID) | ORAL | 0 refills | Status: DC

## 2017-07-12 NOTE — Lactation Note (Signed)
This note was copied from a baby's chart. Lactation Consultation Note  Baby 61 hours old.  Mother states she pumped yesterday x 3 with no drops expressed. Reviewed hand expression and good flow was expressed. Encouraged mother to hand express often and before pumping/feeding. Assisted mother w/ pumping. Explained how breastmilk comes to volume. Faxed WIC pump referral. Provided mother w/ manual pump. She states baby has latched once per day and seems to be doing well. Reviewed milk storage and transportation. Reviewed engorgement care.   Patient Name: Boy Taniyah Ballow [id number] Date: 07/12/2017     Maternal Data    Feeding Feeding Type: Formula Nipple Type: Slow - flow  LATCH Score/Interventions                      Lactation Tools Discussed/Used     Consult Status      Carlye Grippe 07/12/2017, 11:13 AM

## 2017-07-12 NOTE — Progress Notes (Signed)
Patient waiting on ride to arrive for discharge.

## 2017-07-12 NOTE — Progress Notes (Signed)
Pt stating she is going to drive home because she does not have a ride home. MD and [facility] notified.Taxi called to take pt home.

## 2017-07-12 NOTE — Progress Notes (Signed)
Patient discharged home. Discharge instructions, home care, follow-up appts, and prescriptions reviewed. Signs/sympttoms of preeclampsia and reasons to seek medical care reviewed. No questions at this time.

## 2017-07-12 NOTE — Discharge Instructions (Signed)
Postpartum Care After Cesarean Delivery °The period of time right after you deliver your newborn is called the postpartum period. °What kind of medical care will I receive? °· You may continue to receive fluids and medicines through an IV tube inserted into one of your veins. °· You may have small, flexible tube (catheter) draining urine from your bladder into a bag outside of your body. The catheter will be removed as soon as possible. °· You may be given a squirt bottle to use when you go to the bathroom. You may use this until you are comfortable wiping as usual. To use the squirt bottle, follow these steps: °? Before you urinate, fill the squirt bottle with warm water. The water should be warm. Do not use hot water. °? After you urinate, while you are sitting on the toilet, use the squirt bottle to rinse the area around your urethra and vaginal opening. This rinses away any urine and blood. °? You may do this instead of wiping. As you start healing, you may use the squirt bottle before wiping yourself. Make sure to wipe gently. °? Fill the squirt bottle with clean water every time you use the bathroom. °· You will be given sanitary pads to wear. °· Your incision will be monitored to make sure it is healing properly. You will be told when it is safe for your stitches, staples, or skin adhesive tape to be removed. °What can I expect? °· You may not feel the need to urinate for several hours after delivery. °· You will have some soreness and pain in your abdomen. You may have a small amount of blood or clear fluid coming from your incision. °· If you are breastfeeding, you may have uterine contractions every time you breastfeed for up to several weeks postpartum. Uterine contractions help your uterus return to its normal size. °· It is normal to have vaginal bleeding (lochia) after delivery. The amount and appearance of lochia is often similar to a menstrual period in the first week after delivery. It will  gradually decrease over the next few weeks to a dry, yellow-brown discharge. For most women, lochia stops completely by 6-8 weeks after delivery. Vaginal bleeding can vary from woman to woman. °· Within the first few days after delivery, you may have breast engorgement. This is when your breasts feel heavy, full, and uncomfortable. Your breasts may also throb and feel hard, tightly stretched, warm, and tender. After this occurs, you may have milk leaking from your breasts. Your health care provider can help you relieve discomfort due to breast engorgement. Breast engorgement should go away within a few days. °· You may feel more sad or worried than normal due to hormonal changes after delivery. These feelings should not last more than a few days. If these feelings do not go away after several days, speak with your health care provider. °How should I care for myself? °· Tell your health care provider if you have pain or discomfort. °· Drink enough water to keep your urine clear or pale yellow. °· Wash your hands thoroughly with soap and water for at least 20 seconds after changing your sanitary pads or using the toilet, and before holding or feeding your baby. °· If you are not breastfeeding, avoid touching your breasts a lot. Doing this can make your breasts produce more milk. °· If you become weak or lightheaded, or you feel like you might faint, ask for help before: °? Getting out of bed. °? Showering. °·   Change your sanitary pads frequently. Watch for any changes in your flow, such as a sudden increase in volume, a change in color, or the passing of large blood clots. If you pass a blood clot from your vagina, save it to show to your health care provider. Do not flush blood clots down the toilet without having your health care provider look at them. °· Make sure that all your vaccinations are up to date. This can help protect you and your baby from getting certain diseases. You may need to have immunizations done  before you leave the hospital. °· If desired, talk with your health care provider about methods of family planning or birth control (contraception). °How can I start bonding with my baby? °Spending as much time as possible with your baby is very important. During this time, you and your baby can get to know each other and develop a bond. Having your baby stay with you in your room (rooming in) can give you time to get to know your baby. Rooming in can also help you become comfortable caring for your baby. Breastfeeding can also help you bond with your baby. °How can I plan for returning home with my baby? °· Make sure that you have a car seat installed in your vehicle. °? Your car seat should be checked by a certified car seat installer to make sure that it is installed safely. °? Make sure that your baby fits into the car seat safely. °· Ask your health care provider any questions you have about caring for yourself or your baby. Make sure that you are able to contact your health care provider with any questions after leaving the hospital. °This information is not intended to replace advice given to you by your health care provider. Make sure you discuss any questions you have with your health care provider. °Document Released: 09/06/2012 Document Revised: 05/17/2016 Document Reviewed: 11/17/2015 °Elsevier Interactive Patient Education © 2018 [employer] ° °

## 2017-07-12 NOTE — Discharge Summary (Signed)
OB Discharge Summary     Patient Name: Terri Estes DOB: May 29, 1986 MRN: [id number]  Date of admission: 07/07/2017 Delivering MD: Florian Buff   Date of discharge: 07/12/2017  Admitting diagnosis: 93WKS BLOOD WORK AND OTHER TEST SENT BY PHYSICIAN Intrauterine pregnancy: [redacted]w[redacted]d     Secondary diagnosis:  Active Problems:   Gestational hypertension  Additional problems: previous cesarean section     Discharge diagnosis: Term Pregnancy Delivered                                                                                                Post partum procedures:none  Augmentation: none  Complications: None  Hospital course:  Induction of Labor With Cesarean Section  31 y.o. yo [phone] at [redacted]w[redacted]d was admitted to the hospital 07/07/2017 for induction of labor. Patient had a labor course significant for failure to progress. The patient went for cesarean section due to Arrest of Dilation, and delivered a Viable infant,@BABYSUPPRESS (DBLINK,ept,110,,1,,) Membrane Rupture Time/Date: )7:56 PM ,07/09/2017   @Details  of operation can be found in separate operative Note.  Patient had an uncomplicated postpartum course. She is ambulating, tolerating a regular diet, passing flatus, and urinating well.  Patient is discharged home in stable condition on 07/12/17.                                    Physical exam  Vitals:   07/11/17 1742 07/11/17 1812 07/11/17 1949 07/12/17 0425  BP:   128/77 119/76  Pulse:   75 75  Resp: 18 20 17 18   Temp:   98 F (36.7 C) 97.7 F (36.5 C)  TempSrc:   Oral Axillary  SpO2:   99% 100%  Weight:      Height:       General: alert, cooperative and no distress Lochia: appropriate Uterine Fundus: firm Incision: Healing well with no significant drainage DVT Evaluation: No evidence of DVT seen on physical exam. Labs: Lab Results  Component Value Date   WBC 9.0 07/11/2017   HGB 8.8 (L) 07/11/2017   HCT 25.5 (L) 07/11/2017   MCV 89.2 07/11/2017   PLT 121  (L) 07/11/2017   CMP Latest Ref Rng & Units 07/11/2017  Glucose 65 - 99 mg/dL 88  BUN 6 - 20 mg/dL <5(L)  Creatinine 0.44 - 1.00 mg/dL 0.72  Sodium 135 - 145 mmol/L 136  Potassium 3.5 - 5.1 mmol/L 3.5  Chloride 101 - 111 mmol/L 103  CO2 22 - 32 mmol/L 29  Calcium 8.9 - 10.3 mg/dL 7.0(L)  Total Protein 6.5 - 8.1 g/dL 4.8(L)  Total Bilirubin 0.3 - 1.2 mg/dL 0.7  Alkaline Phos 38 - 126 U/L 137(H)  AST 15 - 41 U/L 20  ALT 14 - 54 U/L 10(L)    Discharge instruction: per After Visit Summary and "Baby and Me Booklet".  After visit meds:  Allergies as of 07/12/2017   No Known Allergies     Medication List    TAKE these medications   albuterol 108 (90 Base) MCG/ACT inhaler  Commonly known as:  PROVENTIL HFA;VENTOLIN HFA Inhale 2 puffs into the lungs 4 (four) times daily.   buprenorphine 8 MG Subl SL tablet Commonly known as:  SUBUTEX Place 8 mg under the tongue daily.   buprenorphine 2 MG Subl SL tablet Commonly known as:  SUBUTEX Place 4 mg under the tongue at bedtime.   ibuprofen 600 MG tablet Commonly known as:  ADVIL,MOTRIN Take 1 tablet (600 mg total) by mouth every 6 (six) hours.   lurasidone 40 MG Tabs tablet Commonly known as:  LATUDA Take 40 mg by mouth at bedtime.   pantoprazole 40 MG tablet Commonly known as:  PROTONIX Take 1 tablet (40 mg total) by mouth daily.   polyethylene glycol packet Commonly known as:  MIRALAX / GLYCOLAX Take 17 g by mouth daily as needed for mild constipation.   PRENATAL GUMMIES/DHA & FA 0.4-32.5 MG Chew Chew 2 each by mouth daily.   sertraline 50 MG tablet Commonly known as:  ZOLOFT Take 50 mg by mouth daily.       Diet: routine diet  Activity: Advance as tolerated. Pelvic rest for 6 weeks.   Outpatient follow up:4-5 weeks Follow up Appt:No future appointments. Follow up Visit:No Follow-up on file.  Postpartum contraception: Not Discussed  Newborn Data: Live born female  Birth Weight: 6 lb 9.5 oz (2990 g) APGAR: 8,  8  Baby Feeding: Breast Disposition:NICU   07/12/2017 Emeterio Reeve, MD

## 2017-07-13 ENCOUNTER — Inpatient Hospital Stay (HOSPITAL_COMMUNITY)
Admission: AD | Admit: 2017-07-13 | Discharge: 2017-07-13 | Disposition: A | Discharge status: Home / Self Care | Payer: Medicaid Other | Source: Ambulatory Visit | Attending: Family Medicine | Admitting: Family Medicine

## 2017-07-13 DIAGNOSIS — O9089 Other complications of the puerperium, not elsewhere classified: Secondary | ICD-10-CM | POA: Diagnosis present

## 2017-07-13 DIAGNOSIS — F17210 Nicotine dependence, cigarettes, uncomplicated: Secondary | ICD-10-CM | POA: Diagnosis not present

## 2017-07-13 DIAGNOSIS — Z4889 Encounter for other specified surgical aftercare: Secondary | ICD-10-CM

## 2017-07-13 DIAGNOSIS — T148XXA Other injury of unspecified body region, initial encounter: Secondary | ICD-10-CM

## 2017-07-13 DIAGNOSIS — O99335 Smoking (tobacco) complicating the puerperium: Secondary | ICD-10-CM | POA: Insufficient documentation

## 2017-07-13 NOTE — Discharge Instructions (Signed)
Cesarean Delivery Cesarean birth, or cesarean delivery, is the surgical delivery of a baby through an incision in the abdomen and the uterus. This may be referred to as a C-section. This procedure may be scheduled ahead of time, or it may be done in an emergency situation. Tell a health care provider about:  Any allergies you have.  All medicines you are taking, including vitamins, herbs, eye drops, creams, and over-the-counter medicines.  Any problems you or family members have had with anesthetic medicines.  Any blood disorders you have.  Any surgeries you have had.  Any medical conditions you have.  Whether you or any members of your family have a history of deep vein thrombosis (DVT) or pulmonary embolism (PE). What are the risks? Generally, this is a safe procedure. However, problems may occur, including:  Infection.  Bleeding.  Allergic reactions to medicines.  Damage to other structures or organs.  Blood clots.  Injury to your baby.  What happens before the procedure?  Follow instructions from your health care provider about eating or drinking restrictions.  Follow instructions from your health care provider about bathing before your procedure to help reduce your risk of infection.  If you know that you are going to have a cesarean delivery, do not shave your pubic area. Shaving before the procedure may increase your risk of infection.  Ask your health care provider about: ? Changing or stopping your regular medicines. This is especially important if you are taking diabetes medicines or blood thinners. ? Your pain management plan. This is especially important if you plan to breastfeed your baby. ? How long you will be in the hospital after the procedure. ? Any concerns you may have about receiving blood products if you need them during the procedure. ? Cord blood banking, if you plan to collect your babys umbilical cord blood.  You may also want to ask your  health care provider: ? Whether you will be able to hold or breastfeed your baby while you are still in the operating room. ? Whether your baby can stay with you immediately after the procedure and during your recovery. ? Whether a family member or a person of your choice can go with you into the operating room and stay with you during the procedure, immediately after the procedure, and during your recovery.  Plan to have someone drive you home when you are discharged from the hospital. What happens during the procedure?  Fetal monitors will be placed on your abdomen to monitor your heart rate and your baby's heart rate.  Depending on the reason for your cesarean delivery, you may have a physical exam or additional testing, such as an ultrasound.  An IV tube will be inserted into one of your veins.  You may have your blood or urine tested.  You will be given antibiotic medicine to help prevent infection.  You may be given a special warming gown to wear to keep your temperature stable.  Hair may be removed from your pubic area.  The skin of your pubic area and lower abdomen will be cleaned with a germ-killing solution (antiseptic).  A catheter may be inserted into your bladder through your urethra. This drains your urine during the procedure.  You may be given one or more of the following: ? A medicine to numb the area (local anesthetic). ? A medicine to make you fall asleep (general anesthetic). ? A medicine (regional anesthetic) that is injected into your back or through a small  thin tube placed in your back (spinal anesthetic or epidural anesthetic). This numbs everything below the injection site and allows you to stay awake during your procedure. If this makes you feel nauseous, tell your health care provider. Medicines will be available to help reduce any nausea you may feel. °· An incision will be made in your abdomen, and then in your uterus. °· If you are awake during your  procedure, you may feel tugging and pulling in your abdomen, but you should not feel pain. If you feel pain, tell your health care provider immediately. °· Your baby will be removed from your uterus. You may feel more pressure or pushing while this happens. °· Immediately after birth, your baby will be dried and kept warm. You may be able to hold and breastfeed your baby. The umbilical cord may be clamped and cut during this time. °· Your placenta will be removed from your uterus. °· Your incisions will be closed with stitches (sutures). Staples, skin glue, or adhesive strips may also be applied to the incision in your abdomen. °· Bandages (dressings) will be placed over the incision in your abdomen. °The procedure may vary among health care providers and hospitals. °What happens after the procedure? °· Your blood pressure, heart rate, breathing rate, and blood oxygen level will be monitored often until the medicines you were given have worn off. °· You may continue to receive fluids and medicines through an IV tube. °· You will have some pain. Medicines will be available to help control your pain. °· To help prevent blood clots: °? You may be given medicines. °? You may have to wear compression stockings or devices. °? You will be encouraged to walk around when you are able. °· Hospital staff will encourage and support bonding with your baby. Your hospital may allow you and your baby to stay in the same room (rooming in) during your hospital stay to encourage successful breastfeeding. °· You may be encouraged to cough and breathe deeply often. This helps to prevent lung problems. °· If you have a catheter draining your urine, it will be removed as soon as possible after your procedure. °This information is not intended to replace advice given to you by your health care provider. Make sure you discuss any questions you have with your health care provider. °Document Released: 12/13/2005 Document Revised: 05/20/2016  Document Reviewed: 09/23/2015 °Elsevier Interactive Patient Education © 2017 Elsevier Inc. ° °

## 2017-07-13 NOTE — MAU Provider Note (Signed)
History     CSN: [id number]  Arrival date and time: 07/13/17 0034   None     Chief Complaint  Patient presents with  . c-section incision bleeding   Terri Estes is a 31 y.o. [phone] who is SP C-section. She was DC home today, and noticed a couple of hours ago there was blood on her honeycomb dressing. She states that she did have an episode of emesis prior to noticing the blood. She states that she is worried that she "popped a stitch". Of note, she was induced for pre-eclampsia, and was on magnesium while here. She was not sent home on any antihypertensive medications. She denies any HA, visual disturbance or RUQ pain.    Past Medical History:  Diagnosis Date  . Chronic pain   . Pregnancy induced hypertension     Past Surgical History:  Procedure Laterality Date  . CESAREAN SECTION    . CESAREAN SECTION N/A 07/10/2017   Procedure: CESAREAN SECTION;  Surgeon: Florian Buff, MD;  Location: [facility];  Service: Obstetrics;  Laterality: N/A;  . WISDOM TOOTH EXTRACTION      Family History  Problem Relation Age of Onset  . Kidney disease Maternal Grandmother   . Kidney disease Maternal Grandfather   . Kidney disease Paternal Grandmother   . Diabetes Paternal Grandmother   . Kidney disease Paternal Grandfather     Social History  Substance Use Topics  . Smoking status: Current Every Day Smoker    Packs/day: 0.50    Years: 5.00    Types: Cigarettes  . Smokeless tobacco: Never Used  . Alcohol use No    Allergies: No Known Allergies  Prescriptions Prior to Admission  Medication Sig Dispense Refill Last Dose  . albuterol (PROVENTIL HFA;VENTOLIN HFA) 108 (90 Base) MCG/ACT inhaler Inhale 2 puffs into the lungs 4 (four) times daily.   07/07/2017 at Unknown time  . buprenorphine (SUBUTEX) 2 MG SUBL SL tablet Place 4 mg under the tongue at bedtime.   07/06/2017 at Unknown time  . buprenorphine (SUBUTEX) 8 MG SUBL SL tablet Place 8 mg under the tongue daily.     07/07/2017 at Unknown time  . ibuprofen (ADVIL,MOTRIN) 600 MG tablet Take 1 tablet (600 mg total) by mouth every 6 (six) hours. 30 tablet 0   . lurasidone (LATUDA) 40 MG TABS tablet Take 40 mg by mouth at bedtime.   07/06/2017 at Unknown time  . pantoprazole (PROTONIX) 40 MG tablet Take 1 tablet (40 mg total) by mouth daily. 30 tablet 6 07/07/2017 at Unknown time  . polyethylene glycol (MIRALAX / GLYCOLAX) packet Take 17 g by mouth daily as needed for mild constipation.    Past Week at Unknown time  . Prenatal MV-Min-FA-Omega-3 (PRENATAL GUMMIES/DHA & FA) 0.4-32.5 MG CHEW Chew 2 each by mouth daily.   07/07/2017 at Unknown time  . sertraline (ZOLOFT) 50 MG tablet Take 50 mg by mouth daily.   07/07/2017 at Unknown time    Review of Systems  Constitutional: Negative for chills and fever.  Eyes: Negative for visual disturbance.  Gastrointestinal: Negative for abdominal pain, nausea and vomiting.  Neurological: Negative for headaches.   Physical Exam   Blood pressure (!) 136/93, pulse 89, temperature 98 F (36.7 C), temperature source Oral, resp. rate 17, last menstrual period 10/07/2016.  Physical Exam  Nursing note and vitals reviewed. Constitutional: She is oriented to person, place, and time. She appears well-developed and well-nourished. No distress.  HENT:  Head: Normocephalic.  Cardiovascular: Normal rate.   Respiratory: Effort normal.  GI: Soft.  Small amount of blood on honeycomb dressing.  Dressing removed, incision healing well. No active bleeding.    Neurological: She is alert and oriented to person, place, and time.  Skin: Skin is warm and dry.  Psychiatric: She has a normal mood and affect.    MAU Course  Procedures  MDM D/W the patient routine care after c-section. Ok for honeycomb dressing to be off at this time. Will give patient ABD pads to apply to area. Advised her rest as much as possible while healing.   Assessment and Plan   1. Encounter for post surgical  wound check   2. Bleeding from wound    DC home Comfort measures reviewed  Bleeding precautions RX: none  Return to MAU as needed FU with OB as planned  [place] for [place] Follow up.   Specialty:  Obstetrics and Gynecology Contact information: [place] [address] [place] 07/13/2017, 1:03 AM

## 2017-07-13 NOTE — Progress Notes (Signed)
Esignature not working, patient signed paper copy.

## 2017-07-13 NOTE — MAU Note (Signed)
Pt noticed her c-section incision bleeding after she vomited and had a bowel movement after she was discharged home today.  Not nauseous now.

## 2017-07-15 ENCOUNTER — Telehealth: Payer: Self-pay | Admitting: Obstetrics

## 2017-08-09 ENCOUNTER — Ambulatory Visit: Payer: Medicaid Other | Admitting: Obstetrics & Gynecology

## 2017-08-16 ENCOUNTER — Ambulatory Visit (INDEPENDENT_AMBULATORY_CARE_PROVIDER_SITE_OTHER): Payer: Medicaid Other | Admitting: Obstetrics & Gynecology

## 2017-08-16 ENCOUNTER — Encounter: Payer: Self-pay | Admitting: Obstetrics & Gynecology

## 2017-08-16 VITALS — BP 122/86 | HR 77 | Wt 185.0 lb

## 2017-08-16 DIAGNOSIS — R87613 High grade squamous intraepithelial lesion on cytologic smear of cervix (HGSIL): Secondary | ICD-10-CM

## 2017-08-16 DIAGNOSIS — Z30011 Encounter for initial prescription of contraceptive pills: Secondary | ICD-10-CM

## 2017-08-16 MED ORDER — NORETHINDRONE 0.35 MG PO TABS: 1.0000 | ORAL_TABLET | Freq: Every day | ORAL | 11 refills | Status: DC

## 2017-08-16 NOTE — Progress Notes (Signed)
Post Partum Exam  Terri Estes is a 31 y.o. [phone] female who presents for a postpartum visit. She is 5 weeks postpartum following a low cervical transverse Cesarean section. I have fully reviewed the prenatal and intrapartum course. The delivery was at 39.3 gestational weeks.  Anesthesia: spinal. Postpartum course has been some trouble with breastfeeding. Baby's course has been doing well. Baby is feeding by both breast and bottle -  . Bleeding no bleeding. Bowel function is normal. Bladder function is normal. Patient is not sexually active. Contraception method is abstinence, would like to discuss pill. Pt would like to discuss conception times for future pregnancies. Postpartum depression screening:neg, score 7.  The following portions of the patient's history were reviewed and updated as appropriate: allergies, current medications, past family history, past medical history, past social history, past surgical history and problem list.  Review of Systems Pertinent items are noted in HPI.    Objective:  Last menstrual period 10/07/2016.  General:  alert, cooperative and no distress           Abdomen: soft, non-tender; bowel sounds normal; no masses,  no organomegaly and incision dry and intact   Vulva:  not evaluated  Vagina: not evaluated   Assessment:    normal postpartum exam. Pap smear not done at today's visit.   Plan:   1. Contraception: oral progesterone-only contraceptive 2. Needs colposcopy for HSIL pap 3. Follow up in: 4 weeks or as needed.   Woodroe Mode, MD 08/16/2017

## 2017-08-16 NOTE — Patient Instructions (Signed)
Breastfeeding Deciding to breastfeed is one of the best choices you can make for you and your baby. A change in hormones during pregnancy causes your breast tissue to grow and increases the number and size of your milk ducts. These hormones also allow proteins, sugars, and fats from your blood supply to make breast milk in your milk-producing glands. Hormones prevent breast milk from being released before your baby is born as well as prompt milk flow after birth. Once breastfeeding has begun, thoughts of your baby, as well as his or her sucking or crying, can stimulate the release of milk from your milk-producing glands. Benefits of breastfeeding For Your Baby  Your first milk (colostrum) helps your baby's digestive system function better.  There are antibodies in your milk that help your baby fight off infections.  Your baby has a lower incidence of asthma, allergies, and sudden infant death syndrome.  The nutrients in breast milk are better for your baby than infant formulas and are designed uniquely for your baby's needs.  Breast milk improves your baby's brain development.  Your baby is less likely to develop other conditions, such as childhood obesity, asthma, or type 2 diabetes mellitus.  For You  Breastfeeding helps to create a very special bond between you and your baby.  Breastfeeding is convenient. Breast milk is always available at the correct temperature and costs nothing.  Breastfeeding helps to burn calories and helps you lose the weight gained during pregnancy.  Breastfeeding makes your uterus contract to its prepregnancy size faster and slows bleeding (lochia) after you give birth.  Breastfeeding helps to lower your risk of developing type 2 diabetes mellitus, osteoporosis, and breast or ovarian cancer later in life.  Signs that your baby is hungry Early Signs of Hunger  Increased alertness or activity.  Stretching.  Movement of the head from side to  side.  Movement of the head and opening of the mouth when the corner of the mouth or cheek is stroked (rooting).  Increased sucking sounds, smacking lips, cooing, sighing, or squeaking.  Hand-to-mouth movements.  Increased sucking of fingers or hands.  Late Signs of Hunger  Fussing.  Intermittent crying.  Extreme Signs of Hunger Signs of extreme hunger will require calming and consoling before your baby will be able to breastfeed successfully. Do not wait for the following signs of extreme hunger to occur before you initiate breastfeeding:  Restlessness.  A loud, strong cry.  Screaming.  Breastfeeding basics Breastfeeding Initiation  Find a comfortable place to sit or lie down, with your neck and back well supported.  Place a pillow or rolled up blanket under your baby to bring him or her to the level of your breast (if you are seated). Nursing pillows are specially designed to help support your arms and your baby while you breastfeed.  Make sure that your baby's abdomen is facing your abdomen.  Gently massage your breast. With your fingertips, massage from your chest wall toward your nipple in a circular motion. This encourages milk flow. You may need to continue this action during the feeding if your milk flows slowly.  Support your breast with 4 fingers underneath and your thumb above your nipple. Make sure your fingers are well away from your nipple and your baby's mouth.  Stroke your baby's lips gently with your finger or nipple.  When your baby's mouth is open wide enough, quickly bring your baby to your breast, placing your entire nipple and as much of the colored area   around your nipple (areola) as possible into your baby's mouth. ? More areola should be visible above your baby's upper lip than below the lower lip. ? Your baby's tongue should be between his or her lower gum and your breast.  Ensure that your baby's mouth is correctly positioned around your nipple  (latched). Your baby's lips should create a seal on your breast and be turned out (everted).  It is common for your baby to suck about 2-3 minutes in order to start the flow of breast milk.  Latching Teaching your baby how to latch on to your breast properly is very important. An improper latch can cause nipple pain and decreased milk supply for you and poor weight gain in your baby. Also, if your baby is not latched onto your nipple properly, he or she may swallow some air during feeding. This can make your baby fussy. Burping your baby when you switch breasts during the feeding can help to get rid of the air. However, teaching your baby to latch on properly is still the best way to prevent fussiness from swallowing air while breastfeeding. Signs that your baby has successfully latched on to your nipple:  Silent tugging or silent sucking, without causing you pain.  Swallowing heard between every 3-4 sucks.  Muscle movement above and in front of his or her ears while sucking.  Signs that your baby has not successfully latched on to nipple:  Sucking sounds or smacking sounds from your baby while breastfeeding.  Nipple pain.  If you think your baby has not latched on correctly, slip your finger into the corner of your baby's mouth to break the suction and place it between your baby's gums. Attempt breastfeeding initiation again. Signs of Successful Breastfeeding Signs from your baby:  A gradual decrease in the number of sucks or complete cessation of sucking.  Falling asleep.  Relaxation of his or her body.  Retention of a small amount of milk in his or her mouth.  Letting go of your breast by himself or herself.  Signs from you:  Breasts that have increased in firmness, weight, and size 1-3 hours after feeding.  Breasts that are softer immediately after breastfeeding.  Increased milk volume, as well as a change in milk consistency and color by the fifth day of  breastfeeding.  Nipples that are not sore, cracked, or bleeding.  Signs That Your Baby is Getting Enough Milk  Wetting at least 1-2 diapers during the first 24 hours after birth.  Wetting at least 5-6 diapers every 24 hours for the first week after birth. The urine should be clear or pale yellow by 5 days after birth.  Wetting 6-8 diapers every 24 hours as your baby continues to grow and develop.  At least 3 stools in a 24-hour period by age 5 days. The stool should be soft and yellow.  At least 3 stools in a 24-hour period by age 7 days. The stool should be seedy and yellow.  No loss of weight greater than 10% of birth weight during the first 3 days of age.  Average weight gain of 4-7 ounces (113-198 g) per week after age 4 days.  Consistent daily weight gain by age 5 days, without weight loss after the age of 2 weeks.  After a feeding, your baby may spit up a small amount. This is common. Breastfeeding frequency and duration Frequent feeding will help you make more milk and can prevent sore nipples and breast engorgement. Breastfeed when   you feel the need to reduce the fullness of your breasts or when your baby shows signs of hunger. This is called "breastfeeding on demand." Avoid introducing a pacifier to your baby while you are working to establish breastfeeding (the first 4-6 weeks after your baby is born). After this time you may choose to use a pacifier. Research has shown that pacifier use during the first year of a baby's life decreases the risk of sudden infant death syndrome (SIDS). Allow your baby to feed on each breast as long as he or she wants. Breastfeed until your baby is finished feeding. When your baby unlatches or falls asleep while feeding from the first breast, offer the second breast. Because newborns are often sleepy in the first few weeks of life, you may need to awaken your baby to get him or her to feed. Breastfeeding times will vary from baby to baby. However,  the following rules can serve as a guide to help you ensure that your baby is properly fed:  Newborns (babies 4 weeks of age or younger) may breastfeed every 1-3 hours.  Newborns should not go longer than 3 hours during the day or 5 hours during the night without breastfeeding.  You should breastfeed your baby a minimum of 8 times in a 24-hour period until you begin to introduce solid foods to your baby at around 6 months of age.  Breast milk pumping Pumping and storing breast milk allows you to ensure that your baby is exclusively fed your breast milk, even at times when you are unable to breastfeed. This is especially important if you are going back to work while you are still breastfeeding or when you are not able to be present during feedings. Your lactation consultant can give you guidelines on how long it is safe to store breast milk. A breast pump is a machine that allows you to pump milk from your breast into a sterile bottle. The pumped breast milk can then be stored in a refrigerator or freezer. Some breast pumps are operated by hand, while others use electricity. Ask your lactation consultant which type will work best for you. Breast pumps can be purchased, but some hospitals and breastfeeding support groups lease breast pumps on a monthly basis. A lactation consultant can teach you how to hand express breast milk, if you prefer not to use a pump. Caring for your breasts while you breastfeed Nipples can become dry, cracked, and sore while breastfeeding. The following recommendations can help keep your breasts moisturized and healthy:  Avoid using soap on your nipples.  Wear a supportive bra. Although not required, special nursing bras and tank tops are designed to allow access to your breasts for breastfeeding without taking off your entire bra or top. Avoid wearing underwire-style bras or extremely tight bras.  Air dry your nipples for 3-4minutes after each feeding.  Use only cotton  bra pads to absorb leaked breast milk. Leaking of breast milk between feedings is normal.  Use lanolin on your nipples after breastfeeding. Lanolin helps to maintain your skin's normal moisture barrier. If you use pure lanolin, you do not need to wash it off before feeding your baby again. Pure lanolin is not toxic to your baby. You may also hand express a few drops of breast milk and gently massage that milk into your nipples and allow the milk to air dry.  In the first few weeks after giving birth, some women experience extremely full breasts (engorgement). Engorgement can make your   breasts feel heavy, warm, and tender to the touch. Engorgement peaks within 3-5 days after you give birth. The following recommendations can help ease engorgement:  Completely empty your breasts while breastfeeding or pumping. You may want to start by applying warm, moist heat (in the shower or with warm water-soaked hand towels) just before feeding or pumping. This increases circulation and helps the milk flow. If your baby does not completely empty your breasts while breastfeeding, pump any extra milk after he or she is finished.  Wear a snug bra (nursing or regular) or tank top for 1-2 days to signal your body to slightly decrease milk production.  Apply ice packs to your breasts, unless this is too uncomfortable for you.  Make sure that your baby is latched on and positioned properly while breastfeeding.  If engorgement persists after 48 hours of following these recommendations, contact your health care provider or a lactation consultant. Overall health care recommendations while breastfeeding  Eat healthy foods. Alternate between meals and snacks, eating 3 of each per day. Because what you eat affects your breast milk, some of the foods may make your baby more irritable than usual. Avoid eating these foods if you are sure that they are negatively affecting your baby.  Drink milk, fruit juice, and water to  satisfy your thirst (about 10 glasses a day).  Rest often, relax, and continue to take your prenatal vitamins to prevent fatigue, stress, and anemia.  Continue breast self-awareness checks.  Avoid chewing and smoking tobacco. Chemicals from cigarettes that pass into breast milk and exposure to secondhand smoke may harm your baby.  Avoid alcohol and drug use, including marijuana. Some medicines that may be harmful to your baby can pass through breast milk. It is important to ask your health care provider before taking any medicine, including all over-the-counter and prescription medicine as well as vitamin and herbal supplements. It is possible to become pregnant while breastfeeding. If birth control is desired, ask your health care provider about options that will be safe for your baby. Contact a health care provider if:  You feel like you want to stop breastfeeding or have become frustrated with breastfeeding.  You have painful breasts or nipples.  Your nipples are cracked or bleeding.  Your breasts are red, tender, or warm.  You have a swollen area on either breast.  You have a fever or chills.  You have nausea or vomiting.  You have drainage other than breast milk from your nipples.  Your breasts do not become full before feedings by the fifth day after you give birth.  You feel sad and depressed.  Your baby is too sleepy to eat well.  Your baby is having trouble sleeping.  Your baby is wetting less than 3 diapers in a 24-hour period.  Your baby has less than 3 stools in a 24-hour period.  Your baby's skin or the white part of his or her eyes becomes yellow.  Your baby is not gaining weight by 5 days of age. Get help right away if:  Your baby is overly tired (lethargic) and does not want to wake up and feed.  Your baby develops an unexplained fever. This information is not intended to replace advice given to you by your health care provider. Make sure you discuss  any questions you have with your health care provider. Document Released: 12/13/2005 Document Revised: 05/26/2016 Document Reviewed: 06/06/2013 Elsevier Interactive Patient Education  2017 [employer]  

## 2017-09-14 ENCOUNTER — Encounter: Payer: Medicaid Other | Admitting: Obstetrics & Gynecology

## 2018-01-13 ENCOUNTER — Ambulatory Visit (HOSPITAL_COMMUNITY)
Admission: EM | Admit: 2018-01-13 | Discharge: 2018-01-13 | Disposition: A | Discharge status: Home / Self Care | Payer: Medicaid Other | Attending: Emergency Medicine | Admitting: Emergency Medicine

## 2018-01-13 ENCOUNTER — Other Ambulatory Visit: Payer: Self-pay

## 2018-01-13 ENCOUNTER — Emergency Department (HOSPITAL_COMMUNITY)
Admission: EM | Admit: 2018-01-13 | Discharge: 2018-01-13 | Discharge status: Against Medical Advice | Payer: No Typology Code available for payment source

## 2018-01-13 ENCOUNTER — Encounter (HOSPITAL_COMMUNITY): Payer: Self-pay | Admitting: Emergency Medicine

## 2018-01-13 DIAGNOSIS — R112 Nausea with vomiting, unspecified: Secondary | ICD-10-CM | POA: Diagnosis not present

## 2018-01-13 DIAGNOSIS — Z3202 Encounter for pregnancy test, result negative: Secondary | ICD-10-CM

## 2018-01-13 DIAGNOSIS — Z79899 Other long term (current) drug therapy: Secondary | ICD-10-CM | POA: Diagnosis not present

## 2018-01-13 DIAGNOSIS — R1030 Lower abdominal pain, unspecified: Secondary | ICD-10-CM

## 2018-01-13 DIAGNOSIS — R197 Diarrhea, unspecified: Secondary | ICD-10-CM | POA: Diagnosis not present

## 2018-01-13 DIAGNOSIS — R300 Dysuria: Secondary | ICD-10-CM | POA: Diagnosis not present

## 2018-01-13 DIAGNOSIS — F17210 Nicotine dependence, cigarettes, uncomplicated: Secondary | ICD-10-CM | POA: Diagnosis not present

## 2018-01-13 LAB — POCT URINALYSIS DIP (DEVICE)
Bilirubin Urine: NEGATIVE
GLUCOSE, UA: NEGATIVE mg/dL
Ketones, ur: NEGATIVE mg/dL
NITRITE: POSITIVE — AB
PROTEIN: 100 mg/dL — AB
Specific Gravity, Urine: 1.015 (ref 1.005–1.030)
UROBILINOGEN UA: 4 mg/dL — AB (ref 0.0–1.0)
pH: 7.5 (ref 5.0–8.0)

## 2018-01-13 LAB — POCT PREGNANCY, URINE: Preg Test, Ur: NEGATIVE

## 2018-01-13 MED ORDER — ONDANSETRON HCL 4 MG PO TABS: 4.0000 mg | ORAL_TABLET | Freq: Three times a day (TID) | ORAL | 0 refills | Status: DC | PRN

## 2018-01-13 MED ORDER — NITROFURANTOIN MONOHYD MACRO 100 MG PO CAPS: 100.0000 mg | ORAL_CAPSULE | Freq: Two times a day (BID) | ORAL | 0 refills | Status: AC

## 2018-01-13 NOTE — Discharge Instructions (Signed)
Please get set up with a primary doctor to further work up causes of nausea and vomiting. Follow up with GI if symptoms persisting.   For nausea: Zofran prescribed. Begin with every 6 hours, than as you are able to hold food down, take it as needed. Start with clear liquids, then move to plain foods like bananas, rice, applesauce, toast, broth, grits, oatmeal. As those food settle okay you may transition to your normal foods. Avoid spicy and greasy foods as much as possible.  For Diarrhea: You may try taking 1 imodium to decrease amount of stools a day, but we do not want you to stop your diarrhea.   Preventing dehydration is key! You need to replace the fluid your body is expelling. Drink plenty of fluids, may use Pedialyte or sports drinks.   Please return if you are experiencing blood in your vomit or stool or experiencing dizziness, lightheadedness, extreme fatigue, increased abdominal pain.   UTI: Take macrobid twice daily for 5 days. Please return if symptoms not improving with treatment.

## 2018-01-13 NOTE — ED Notes (Signed)
Patient informed registration that she was not waiting and gave patient labels.

## 2018-01-13 NOTE — ED Provider Notes (Signed)
[facility]    CSN: [id number] Arrival date & time: 01/13/18  1238     History   Chief Complaint Chief Complaint  Patient presents with  . Emesis  . Abdominal Cramping    HPI Terri Estes is a 32 y.o. female presenting with intermittent nausea, vomting,and diarrhea. Symptoms have been occurring for 2 months off and on. Also endorsing fatigue and tiredness. She will go a week or so without symptoms and then they will return. Not currently with diarrhea, but does have vomiting. Eating chips in waiting room, settling okay. She notes she is lactose intolerant but avoids dairy. Gave birth 6 months ago, states she is having a lot of similar symptoms as when she was pregnant- including being sensitive to smells, burping with sour taste, feeling gassy. In past has struggled with constipation. Denies abdominal pain. Is having urinary symptoms of burning and sensation of incomplete voiding. Denies vaginal discharge. LMP in December, had 2 during month, periods have not normalized since pregnancy. On OCP's does not take consistently. On protonix, does not take regularly. States she does not eat healthily and eats about 1 meal a day.   HPI  Past Medical History:  Diagnosis Date  . Chronic pain   . Pregnancy induced hypertension     Patient Active Problem List   Diagnosis Date Noted  . Illicit drug use 95/62/1308  . HSIL (high grade squamous intraepithelial lesion) on Pap smear of cervix 12/24/2016  . History of cesarean delivery 12/16/2016    Past Surgical History:  Procedure Laterality Date  . CESAREAN SECTION    . CESAREAN SECTION N/A 07/10/2017   Procedure: CESAREAN SECTION;  Surgeon: Florian Buff, MD;  Location: [facility];  Service: Obstetrics;  Laterality: N/A;  . WISDOM TOOTH EXTRACTION      OB History    Gravida Para Term Preterm AB Living   5 2 1 1 3 2    SAB TAB Ectopic Multiple Live Births     3     2       Home Medications    Prior to  Admission medications   Medication Sig Start Date End Date Taking? Authorizing Provider  Sertraline HCl (ZOLOFT PO) Take by mouth.   Yes [provider]  albuterol (PROVENTIL HFA;VENTOLIN HFA) 108 (90 Base) MCG/ACT inhaler Inhale 2 puffs into the lungs 4 (four) times daily.    [provider]  buprenorphine (SUBUTEX) 2 MG SUBL SL tablet Place 4 mg under the tongue at bedtime.    [provider]  buprenorphine (SUBUTEX) 8 MG SUBL SL tablet Place 8 mg under the tongue daily.     [provider]  busPIRone (BUSPAR) 15 MG tablet Take 15 mg by mouth 2 (two) times daily.    [provider]  ibuprofen (ADVIL,MOTRIN) 600 MG tablet Take 1 tablet (600 mg total) by mouth every 6 (six) hours. 07/12/17   Woodroe Mode, MD  lurasidone (LATUDA) 40 MG TABS tablet Take 40 mg by mouth at bedtime.    [provider]  nitrofurantoin, macrocrystal-monohydrate, (MACROBID) 100 MG capsule Take 1 capsule (100 mg total) by mouth 2 (two) times daily for 5 days. 01/13/18 01/18/18  Aliyanah Rozas C, PA-C  norethindrone (MICRONOR,CAMILA,ERRIN) 0.35 MG tablet Take 1 tablet (0.35 mg total) by mouth daily. 08/16/17   Woodroe Mode, MD  ondansetron (ZOFRAN) 4 MG tablet Take 1 tablet (4 mg total) by mouth every 8 (eight) hours as needed for nausea or  vomiting. 01/13/18   Liat Mayol C, PA-C  pantoprazole (PROTONIX) 40 MG tablet Take 1 tablet (40 mg total) by mouth daily. 03/09/17 03/09/18  Woodroe Mode, MD  polyethylene glycol [facility] / Floria Raveling) packet Take 17 g by mouth daily as needed for mild constipation.     [provider]  Prenatal MV-Min-FA-Omega-3 (PRENATAL GUMMIES/DHA & FA) 0.4-32.5 MG CHEW Chew 2 each by mouth daily.    [provider]    Family History Family History  Problem Relation Age of Onset  . Kidney disease Maternal Grandmother   . Kidney disease Maternal Grandfather   . Kidney disease Paternal Grandmother   . Diabetes Paternal  Grandmother   . Kidney disease Paternal Grandfather     Social History Social History   Tobacco Use  . Smoking status: Current Every Day Smoker    Packs/day: 0.50    Years: 5.00    Pack years: 2.50    Types: Cigarettes  . Smokeless tobacco: Never Used  Substance Use Topics  . Alcohol use: No  . Drug use: Yes    Types: Marijuana, Cocaine    Comment: subutex     Allergies   Patient has no known allergies.   Review of Systems Review of Systems  Constitutional: Positive for fatigue. Negative for activity change, appetite change and fever.  Respiratory: Negative for shortness of breath.   Cardiovascular: Negative for chest pain.  Gastrointestinal: Positive for diarrhea, nausea and vomiting. Negative for abdominal pain.  Genitourinary: Positive for dysuria. Negative for flank pain, genital sores, hematuria, menstrual problem, vaginal bleeding, vaginal discharge and vaginal pain.  Musculoskeletal: Positive for back pain.  Skin: Negative for rash.  Neurological: Positive for headaches. Negative for dizziness and light-headedness.     Physical Exam Triage Vital Signs ED Triage Vitals [01/13/18 1400]  Enc Vitals Group     BP 128/70     Pulse Rate 90     Resp 18     Temp 98.4 F (36.9 C)     Temp src      SpO2 100 %     Weight      Height      Head Circumference      Peak Flow      Pain Score      Pain Loc      Pain Edu?      Excl. in [place]?    No data found.  Updated Vital Signs BP 128/70   Pulse 90   Temp 98.4 F (36.9 C)   Resp 18   SpO2 100%    Physical Exam  Constitutional: She appears well-developed and well-nourished. No distress.  HENT:  Head: Normocephalic and atraumatic.  Mouth/Throat: Uvula is midline. Mucous membranes are dry. No oral lesions. No trismus in the jaw. No uvula swelling. No posterior oropharyngeal erythema.  Eyes: Conjunctivae are normal.  Neck: Neck supple.  Cardiovascular: Normal rate and regular rhythm.  No murmur  heard. Pulmonary/Chest: Effort normal and breath sounds normal. No respiratory distress.  Abdominal: Soft. There is tenderness.  Mild tenderness to suprapubic area, non tender elsewhere to light and deep palpation, no rebound, negative murphy's. No CVA tenderness  Musculoskeletal: She exhibits no edema.  Neurological: She is alert.  Skin: Skin is warm and dry.  Psychiatric: She has a normal mood and affect.  Nursing note and vitals reviewed.    UC Treatments / Results  Labs (all labs ordered are listed, but only abnormal results are displayed) Labs Reviewed  POCT URINALYSIS DIP (DEVICE) - Abnormal; Notable for the following components:      Result Value   Hgb urine dipstick TRACE (*)    Protein, ur 100 (*)    Urobilinogen, UA 4.0 (*)    Nitrite POSITIVE (*)    Leukocytes, UA LARGE (*)    All other components within normal limits  URINE CULTURE  POCT PREGNANCY, URINE    EKG  EKG Interpretation None       Radiology No results found.  Procedures Procedures (including critical care time)  Medications Ordered in UC Medications - No data to display   Initial Impression / Assessment and Plan / UC Course  I have reviewed the triage vital signs and the nursing notes.  Pertinent labs & imaging results that were available during my care of the patient were reviewed by me and considered in my medical decision making (see chart for details).     Non pregnant female with UA with signs of UTI with symptoms. Will treat with macroibd, not breastfeeding. Intermittent nausea and vomiting, mildly dehydrated based off mucous membranes, but stable and not actively vomiting. Will send with zofran and instructions for hydrating. Advised to try and take care of herself by eating well, not skipping meals. Discussed strict return precautions to go to ED. Patient verbalized understanding and is agreeable with plan. Establish PCP for further workup.   Final Clinical Impressions(s) / UC  Diagnoses   Final diagnoses:  Nausea vomiting and diarrhea  Dysuria    ED Discharge Orders        Ordered    nitrofurantoin, macrocrystal-monohydrate, (MACROBID) 100 MG capsule  2 times daily     01/13/18 1452    ondansetron (ZOFRAN) 4 MG tablet  Every 8 hours PRN     01/13/18 1452       Controlled Substance Prescriptions [place] Controlled Substance Registry consulted? Not Applicable   Janith Lima, PA-C 01/13/18 1513    Janith Lima, [place] 01/13/18 2234

## 2018-01-13 NOTE — ED Triage Notes (Signed)
Pt c/o n/v/d x2 months, states she cant keep anything down.

## 2018-01-15 LAB — URINE CULTURE: Culture: >=100000 — AB

## 2018-03-29 ENCOUNTER — Encounter: Payer: Self-pay | Admitting: Specialist

## 2018-05-10 ENCOUNTER — Ambulatory Visit (INDEPENDENT_AMBULATORY_CARE_PROVIDER_SITE_OTHER): Payer: Medicaid Other | Admitting: Obstetrics and Gynecology

## 2018-05-10 ENCOUNTER — Other Ambulatory Visit (HOSPITAL_COMMUNITY)
Admission: RE | Admit: 2018-05-10 | Discharge: 2018-05-10 | Disposition: A | Discharge status: Home / Self Care | Payer: Medicaid Other | Source: Ambulatory Visit | Attending: Obstetrics and Gynecology | Admitting: Obstetrics and Gynecology

## 2018-05-10 ENCOUNTER — Encounter: Payer: Self-pay | Admitting: Obstetrics and Gynecology

## 2018-05-10 DIAGNOSIS — Z3A00 Weeks of gestation of pregnancy not specified: Secondary | ICD-10-CM | POA: Insufficient documentation

## 2018-05-10 DIAGNOSIS — O09899 Supervision of other high risk pregnancies, unspecified trimester: Secondary | ICD-10-CM | POA: Insufficient documentation

## 2018-05-10 DIAGNOSIS — Z3481 Encounter for supervision of other normal pregnancy, first trimester: Secondary | ICD-10-CM

## 2018-05-10 DIAGNOSIS — O09299 Supervision of pregnancy with other poor reproductive or obstetric history, unspecified trimester: Secondary | ICD-10-CM | POA: Insufficient documentation

## 2018-05-10 DIAGNOSIS — O34219 Maternal care for unspecified type scar from previous cesarean delivery: Secondary | ICD-10-CM

## 2018-05-10 DIAGNOSIS — D069 Carcinoma in situ of cervix, unspecified: Secondary | ICD-10-CM | POA: Insufficient documentation

## 2018-05-10 DIAGNOSIS — Z98891 History of uterine scar from previous surgery: Secondary | ICD-10-CM

## 2018-05-10 DIAGNOSIS — O09891 Supervision of other high risk pregnancies, first trimester: Secondary | ICD-10-CM

## 2018-05-10 DIAGNOSIS — O9A119 Malignant neoplasm complicating pregnancy, unspecified trimester: Secondary | ICD-10-CM | POA: Insufficient documentation

## 2018-05-10 DIAGNOSIS — O09211 Supervision of pregnancy with history of pre-term labor, first trimester: Secondary | ICD-10-CM

## 2018-05-10 DIAGNOSIS — O99340 Other mental disorders complicating pregnancy, unspecified trimester: Secondary | ICD-10-CM

## 2018-05-10 DIAGNOSIS — F329 Major depressive disorder, single episode, unspecified: Secondary | ICD-10-CM

## 2018-05-10 DIAGNOSIS — Z3480 Encounter for supervision of other normal pregnancy, unspecified trimester: Secondary | ICD-10-CM | POA: Diagnosis present

## 2018-05-10 DIAGNOSIS — O09291 Supervision of pregnancy with other poor reproductive or obstetric history, first trimester: Secondary | ICD-10-CM

## 2018-05-10 DIAGNOSIS — O09219 Supervision of pregnancy with history of pre-term labor, unspecified trimester: Secondary | ICD-10-CM

## 2018-05-10 DIAGNOSIS — O99341 Other mental disorders complicating pregnancy, first trimester: Secondary | ICD-10-CM

## 2018-05-10 MED ORDER — ASPIRIN EC 81 MG PO TBEC: 81.0000 mg | DELAYED_RELEASE_TABLET | Freq: Every day | ORAL | 2 refills | Status: DC

## 2018-05-10 MED ORDER — DOXYLAMINE-PYRIDOXINE 10-10 MG PO TBEC: 2.0000 | DELAYED_RELEASE_TABLET | Freq: Every day | ORAL | 5 refills | Status: DC

## 2018-05-10 NOTE — Progress Notes (Signed)
Subjective:    Terri Estes is a [id number] [redacted]w[redacted]d being seen today for her first obstetrical visit.  Her obstetrical history is significant for previous c-section x 2, preeclampsia with both pregnancies, history of substance abuse currently on subutex and depression. Short interval between pregnancies . Patient does intend to breast feed. Pregnancy history fully reviewed.  Patient reports nausea and feelings of depression. She stopped her zoloft and latuda.. She denies suicidal/homicidal ideations  Vitals:   05/10/18 1425  BP: 102/74  Pulse: (!) 112  Weight: 156 lb 4.8 oz (70.9 kg)    HISTORY: OB History  Gravida Para Term Preterm AB Living  6 2 1 1 3 2   SAB TAB Ectopic Multiple Live Births    3     2    # Outcome Date GA Lbr Len/2nd Weight Sex Delivery Anes PTL Lv  6 Current           5 Term 07/10/17 [redacted]w[redacted]d  6 lb 9.5 oz (2.99 kg) M CS-LTranv EPI  LIV     Birth Comments: Right upper eyelid everted and swollen.  Right lower eyelid also swollen.  No drainage visible.  4 Preterm 05/26/08 [redacted]w[redacted]d  2 lb 4 oz (1.021 kg) F CS-LTranv  Y LIV     Birth Comments: cerebral palsy     Complications: Hypertension  3 TAB           2 TAB           1 TAB            Past Medical History:  Diagnosis Date  . Chronic pain   . Pregnancy induced hypertension    Past Surgical History:  Procedure Laterality Date  . CESAREAN SECTION  05/26/2008  . CESAREAN SECTION N/A 07/10/2017   Procedure: CESAREAN SECTION;  Surgeon: Terri Buff, MD;  Location: [place];  Service: Obstetrics;  Laterality: N/A;  . WISDOM TOOTH EXTRACTION     Family History  Problem Relation Age of Onset  . Kidney disease Maternal Grandmother   . Kidney disease Maternal Grandfather   . Kidney disease Paternal Grandmother   . Diabetes Paternal Grandmother   . Kidney disease Paternal Grandfather      Exam    Uterus:   10-weeks in size  Pelvic Exam:    Perineum: Normal Perineum   Vulva: normal   Vagina:   normal mucosa, normal discharge   pH:    Cervix: multiparous appearance   Adnexa: normal adnexa and no mass, fullness, tenderness   Bony Pelvis: gynecoid  System: Breast:  normal appearance, no masses or tenderness   Skin: normal coloration and turgor, no rashes    Neurologic: oriented, no focal deficits   Extremities: normal strength, tone, and muscle mass   HEENT extra ocular movement intact   Mouth/Teeth mucous membranes moist, pharynx normal without lesions and dental hygiene good   Neck supple and no masses   Cardiovascular: regular rate and rhythm   Respiratory:  chest clear, no wheezing, crepitations, rhonchi, normal symmetric air entry   Abdomen: soft, non-tender; bowel sounds normal; no masses,  no organomegaly   Urinary:       Assessment:    Pregnancy: [id number] Patient Active Problem List   Diagnosis Date Noted  . Supervision of other normal pregnancy, antepartum 05/10/2018  . Short interval between pregnancies affecting pregnancy, antepartum 05/10/2018  . Hx of preeclampsia, prior pregnancy, currently pregnant 05/10/2018  . History of preterm delivery, currently pregnant 05/10/2018  .  Depression affecting pregnancy, antepartum 05/10/2018  . HSIL (high grade squamous intraepithelial lesion) on Pap smear of cervix 12/24/2016  . History of cesarean delivery 12/16/2016        Plan:     Initial labs drawn. Prenatal vitamins. Problem list reviewed and updated. Genetic Screening discussed : panorama ordered.  Ultrasound discussed; fetal survey: requested. Rx diclegis and phenergan provided Patient encouraged to restart zoloft for depression. Patient also referred to see Terri Estes (behavioral health specialist) Rx ASA provided to start at 12 weeks given h/o preeclampsia Patient is considering BTL for contraception Patient understands that she will be scheduled for a repeat c-section at 39 weeks pending no obstetrical complications  Follow up in 4 weeks. 50% of 90 min  visit spent on counseling and coordination of care.     Terri Estes 05/10/2018

## 2018-05-10 NOTE — BH Specialist Note (Deleted)
Integrated Behavioral Health Initial Visit  MRN: [id number] Name: Terri Estes  Number of [place] Clinician visits:: 1/6 Session Start time: ***  Session End time: *** Total time: {IBH Total Time:21014050}  Type of Service: [place] Interpretor:No. Interpretor Name and Language: n/a   Warm Hand Off Completed.       SUBJECTIVE: Terri Estes is a 32 y.o. female accompanied by {CHL AMB ACCOMPANIED WU:9811914782} Patient was referred by Dr Harolyn Rutherford for depression. Patient reports the following symptoms/concerns: *** Duration of problem: ***; Severity of problem: {Mild/Moderate/Severe:20260}  OBJECTIVE: Mood: {BHH MOOD:22306} and Affect: {BHH AFFECT:22307} Risk of harm to self or others: {CHL AMB BH Suicide Current Mental Status:21022748}  LIFE CONTEXT: Family and Social: *** School/Work: *** Self-Care: *** Life Changes: ***  GOALS ADDRESSED: Patient will: 1. Reduce symptoms of: {IBH Symptoms:21014056} 2. Increase knowledge and/or ability of: {IBH Patient Tools:21014057}  3. Demonstrate ability to: {IBH Goals:21014053}  INTERVENTIONS: Interventions utilized: {IBH Interventions:21014054}  Standardized Assessments completed: {IBH Screening Tools:21014051}  ASSESSMENT: Patient currently experiencing ***.   Patient may benefit from ***.  PLAN: 1. Follow up with behavioral health clinician on : *** 2. Behavioral recommendations: *** 3. Referral(s): {IBH Referrals:21014055} 4. "From scale of 1-10, how likely are you to follow plan?": ***  Terri Estes C Terri Peplinski, LCSW  No flowsheet data found.  No flowsheet data found.

## 2018-05-11 ENCOUNTER — Institutional Professional Consult (permissible substitution): Payer: Medicaid Other

## 2018-05-11 LAB — COMPREHENSIVE METABOLIC PANEL
ALBUMIN: 4.2 g/dL (ref 3.5–5.5)
ALT: 20 IU/L (ref 0–32)
AST: 12 IU/L (ref 0–40)
Albumin/Globulin Ratio: 1.4 (ref 1.2–2.2)
Alkaline Phosphatase: 62 IU/L (ref 39–117)
BUN / CREAT RATIO: 9 (ref 9–23)
BUN: 7 mg/dL (ref 6–20)
Bilirubin Total: 0.3 mg/dL (ref 0.0–1.2)
CALCIUM: 9.7 mg/dL (ref 8.7–10.2)
CO2: 20 mmol/L (ref 20–29)
CREATININE: 0.76 mg/dL (ref 0.57–1.00)
Chloride: 98 mmol/L (ref 96–106)
GFR calc Af Amer: 123 mL/min/{1.73_m2} (ref >59)
GFR, EST NON AFRICAN AMERICAN: 106 mL/min/{1.73_m2} (ref >59)
GLOBULIN, TOTAL: 3 g/dL (ref 1.5–4.5)
Glucose: 80 mg/dL (ref 65–99)
POTASSIUM: 3.7 mmol/L (ref 3.5–5.2)
SODIUM: 137 mmol/L (ref 134–144)
TOTAL PROTEIN: 7.2 g/dL (ref 6.0–8.5)

## 2018-05-11 LAB — OBSTETRIC PANEL, INCLUDING HIV
Antibody Screen: NEGATIVE
BASOS ABS: 0 10*3/uL (ref 0.0–0.2)
Basos: 0 %
EOS (ABSOLUTE): 0 10*3/uL (ref 0.0–0.4)
Eos: 1 %
HEP B S AG: NEGATIVE
HIV Screen 4th Generation wRfx: NONREACTIVE
Hematocrit: 35.4 % (ref 34.0–46.6)
Hemoglobin: 11.9 g/dL (ref 11.1–15.9)
IMMATURE GRANULOCYTES: 0 %
Immature Grans (Abs): 0 10*3/uL (ref 0.0–0.1)
LYMPHS: 21 %
Lymphocytes Absolute: 1.4 10*3/uL (ref 0.7–3.1)
MCH: 30.4 pg (ref 26.6–33.0)
MCHC: 33.6 g/dL (ref 31.5–35.7)
MCV: 91 fL (ref 79–97)
MONOCYTES: 4 %
Monocytes Absolute: 0.3 10*3/uL (ref 0.1–0.9)
NEUTROS ABS: 5 10*3/uL (ref 1.4–7.0)
NEUTROS PCT: 74 %
PLATELETS: 279 10*3/uL (ref 150–379)
RBC: 3.91 x10E6/uL (ref 3.77–5.28)
RDW: 15.2 % (ref 12.3–15.4)
RPR: NONREACTIVE
RUBELLA: 3.85 {index} (ref >0.99)
Rh Factor: POSITIVE
WBC: 6.7 10*3/uL (ref 3.4–10.8)

## 2018-05-11 LAB — VITAMIN D 25 HYDROXY (VIT D DEFICIENCY, FRACTURES): Vit D, 25-Hydroxy: 12.8 ng/mL — ABNORMAL LOW (ref 30.0–100.0)

## 2018-05-11 LAB — PROTEIN / CREATININE RATIO, URINE
Creatinine, Urine: 296.3 mg/dL
Protein, Ur: 39.6 mg/dL
Protein/Creat Ratio: 134 mg/g creat (ref 0–200)

## 2018-05-12 LAB — CYTOLOGY - PAP
CHLAMYDIA, DNA PROBE: NEGATIVE
Candida vaginitis: NEGATIVE
NEISSERIA GONORRHEA: NEGATIVE
TRICH (WINDOWPATH): NEGATIVE

## 2018-05-13 LAB — CULTURE, OB URINE

## 2018-05-13 LAB — URINE CULTURE, OB REFLEX

## 2018-05-15 ENCOUNTER — Telehealth: Payer: Self-pay

## 2018-05-15 NOTE — Telephone Encounter (Signed)
TC to [facility] for PA on  Diclegis   PA # [id number]  # [phone]

## 2018-05-17 LAB — SMN1 COPY NUMBER ANALYSIS (SMA CARRIER SCREENING)

## 2018-05-23 ENCOUNTER — Telehealth: Payer: Self-pay

## 2018-05-23 NOTE — Telephone Encounter (Signed)
LM for pt to c/b. Pt needs colposcopy.

## 2018-05-24 NOTE — Telephone Encounter (Signed)
LM for pt to cb 

## 2018-05-25 NOTE — Telephone Encounter (Signed)
Pt notified. Colposcopy explained in detail. Appt desk will schedule colposcopy appt.

## 2018-05-25 NOTE — Telephone Encounter (Signed)
LVM for pt to cb.

## 2018-05-31 ENCOUNTER — Encounter: Payer: Medicaid Other | Admitting: Obstetrics and Gynecology

## 2018-06-05 ENCOUNTER — Encounter: Payer: Medicaid Other | Admitting: Obstetrics & Gynecology

## 2018-06-12 ENCOUNTER — Other Ambulatory Visit: Payer: Self-pay

## 2018-06-12 ENCOUNTER — Ambulatory Visit (INDEPENDENT_AMBULATORY_CARE_PROVIDER_SITE_OTHER): Payer: Medicaid Other | Admitting: Obstetrics and Gynecology

## 2018-06-12 ENCOUNTER — Encounter: Payer: Self-pay | Admitting: Obstetrics and Gynecology

## 2018-06-12 VITALS — BP 117/79 | HR 80 | Wt 169.5 lb

## 2018-06-12 DIAGNOSIS — Z98891 History of uterine scar from previous surgery: Secondary | ICD-10-CM

## 2018-06-12 DIAGNOSIS — O09292 Supervision of pregnancy with other poor reproductive or obstetric history, second trimester: Secondary | ICD-10-CM

## 2018-06-12 DIAGNOSIS — F32A Depression, unspecified: Secondary | ICD-10-CM

## 2018-06-12 DIAGNOSIS — Z3482 Encounter for supervision of other normal pregnancy, second trimester: Secondary | ICD-10-CM

## 2018-06-12 DIAGNOSIS — Z87898 Personal history of other specified conditions: Secondary | ICD-10-CM

## 2018-06-12 DIAGNOSIS — O99342 Other mental disorders complicating pregnancy, second trimester: Secondary | ICD-10-CM

## 2018-06-12 DIAGNOSIS — Z3480 Encounter for supervision of other normal pregnancy, unspecified trimester: Secondary | ICD-10-CM

## 2018-06-12 DIAGNOSIS — O09219 Supervision of pregnancy with history of pre-term labor, unspecified trimester: Secondary | ICD-10-CM

## 2018-06-12 DIAGNOSIS — F1911 Other psychoactive substance abuse, in remission: Secondary | ICD-10-CM | POA: Insufficient documentation

## 2018-06-12 DIAGNOSIS — O09899 Supervision of other high risk pregnancies, unspecified trimester: Secondary | ICD-10-CM

## 2018-06-12 DIAGNOSIS — F329 Major depressive disorder, single episode, unspecified: Secondary | ICD-10-CM

## 2018-06-12 DIAGNOSIS — O99340 Other mental disorders complicating pregnancy, unspecified trimester: Secondary | ICD-10-CM

## 2018-06-12 DIAGNOSIS — O09299 Supervision of pregnancy with other poor reproductive or obstetric history, unspecified trimester: Secondary | ICD-10-CM

## 2018-06-12 NOTE — Progress Notes (Signed)
ROB.  C/o being lightheaded and having slight headache 6/10 x 1 week.  She also fell 2x

## 2018-06-12 NOTE — Progress Notes (Signed)
   PRENATAL VISIT NOTE  Subjective:  Terri Estes is a 32 y.o. [phone] at [redacted]w[redacted]d being seen today for ongoing prenatal care.  She is currently monitored for the following issues for this high-risk pregnancy and has History of cesarean delivery; HSIL (high grade squamous intraepithelial lesion) on Pap smear of cervix; Supervision of other normal pregnancy, antepartum; Short interval between pregnancies affecting pregnancy, antepartum; Hx of preeclampsia, prior pregnancy, currently pregnant; History of preterm delivery, currently pregnant; Depression affecting pregnancy, antepartum; and History of substance abuse on their problem list.  Patient reports feeling lightheaded and dizzy. She reports a headache for the past week which she has not treated with tylenol.  Contractions: Not present. Vag. Bleeding: None.   . Denies leaking of fluid.   The following portions of the patient's history were reviewed and updated as appropriate: allergies, current medications, past family history, past medical history, past social history, past surgical history and problem list. Problem list updated.  Objective:   Vitals:   06/12/18 1418  BP: 117/79  Pulse: 80  Weight: 169 lb 8 oz (76.9 kg)    Fetal Status:   Fundal Height: 145 cm       General:  Alert, oriented and cooperative. Patient is in no acute distress.  Skin: Skin is warm and dry. No rash noted.   Cardiovascular: Normal heart rate noted  Respiratory: Normal respiratory effort, no problems with respiration noted  Abdomen: Soft, gravid, appropriate for gestational age.  Pain/Pressure: Absent     Pelvic: Cervical exam deferred        Extremities: Normal range of motion.  Edema: Trace  Mental Status: Normal mood and affect. Normal behavior. Normal judgment and thought content.   Assessment and Plan:  Pregnancy: [id number] at [redacted]w[redacted]d  1. Supervision of other normal pregnancy, antepartum Patient is doing well Encouraged her to stay well hydrated  and to carry a snack with her at all times to help cope with lightheadedness.  Encouraged her to take tylenol for headaches AFP today Anatomy ultrasound ordered  2. Hx of preeclampsia, prior pregnancy, currently pregnant Continue ASA  3. History of cesarean delivery Patient will be scheduled for 3rd c-section  4. Depression affecting pregnancy, antepartum Stable  5. History of preterm delivery, currently pregnant Due to preeclampsia  6. History of substance abuse Currently on subutex Will plan for NICU tour  Preterm labor symptoms and general obstetric precautions including but not limited to vaginal bleeding, contractions, leaking of fluid and fetal movement were reviewed in detail with the patient. Please refer to After Visit Summary for other counseling recommendations.  Return in about 1 month (around 07/10/2018) for [place].  No future appointments.  Mora Bellman, MD

## 2018-06-14 LAB — AFP, SERUM, OPEN SPINA BIFIDA
AFP MoM: 0.94
AFP VALUE AFPOSL: 28.8 ng/mL
Gest. Age on Collection Date: 15.4 weeks
Maternal Age At EDD: 30.1 yr
OSBR RISK 1 IN: 10000
Test Results:: NEGATIVE
WEIGHT: 169 [lb_av]

## 2018-06-28 ENCOUNTER — Encounter (HOSPITAL_COMMUNITY): Payer: Self-pay

## 2018-07-06 ENCOUNTER — Other Ambulatory Visit (HOSPITAL_COMMUNITY): Payer: Self-pay | Admitting: *Deleted

## 2018-07-06 ENCOUNTER — Other Ambulatory Visit: Payer: Self-pay | Admitting: Obstetrics and Gynecology

## 2018-07-06 ENCOUNTER — Encounter (HOSPITAL_COMMUNITY): Payer: Self-pay

## 2018-07-06 ENCOUNTER — Ambulatory Visit (HOSPITAL_COMMUNITY)
Admission: RE | Admit: 2018-07-06 | Discharge: 2018-07-06 | Disposition: A | Discharge status: Home / Self Care | Payer: Medicaid Other | Source: Ambulatory Visit | Attending: Obstetrics and Gynecology | Admitting: Obstetrics and Gynecology

## 2018-07-06 DIAGNOSIS — O99323 Drug use complicating pregnancy, third trimester: Secondary | ICD-10-CM

## 2018-07-06 DIAGNOSIS — O09293 Supervision of pregnancy with other poor reproductive or obstetric history, third trimester: Secondary | ICD-10-CM | POA: Diagnosis not present

## 2018-07-06 DIAGNOSIS — Z3480 Encounter for supervision of other normal pregnancy, unspecified trimester: Secondary | ICD-10-CM

## 2018-07-06 DIAGNOSIS — O09213 Supervision of pregnancy with history of pre-term labor, third trimester: Secondary | ICD-10-CM

## 2018-07-06 DIAGNOSIS — Z363 Encounter for antenatal screening for malformations: Secondary | ICD-10-CM

## 2018-07-06 DIAGNOSIS — F1120 Opioid dependence, uncomplicated: Secondary | ICD-10-CM

## 2018-07-06 DIAGNOSIS — Z3A19 19 weeks gestation of pregnancy: Secondary | ICD-10-CM | POA: Diagnosis not present

## 2018-07-06 DIAGNOSIS — O99320 Drug use complicating pregnancy, unspecified trimester: Principal | ICD-10-CM

## 2018-07-10 ENCOUNTER — Encounter: Payer: Medicaid Other | Admitting: Obstetrics and Gynecology

## 2018-07-21 ENCOUNTER — Encounter (HOSPITAL_COMMUNITY): Payer: Self-pay

## 2018-07-21 ENCOUNTER — Other Ambulatory Visit: Payer: Self-pay

## 2018-07-21 ENCOUNTER — Telehealth: Payer: Self-pay | Admitting: *Deleted

## 2018-07-21 ENCOUNTER — Inpatient Hospital Stay (HOSPITAL_COMMUNITY)
Admission: AD | Admit: 2018-07-21 | Discharge: 2018-07-21 | Disposition: A | Discharge status: Home / Self Care | Payer: Medicaid Other | Source: Ambulatory Visit | Attending: Obstetrics & Gynecology | Admitting: Obstetrics & Gynecology

## 2018-07-21 DIAGNOSIS — M5430 Sciatica, unspecified side: Secondary | ICD-10-CM

## 2018-07-21 DIAGNOSIS — Y93F1 Activity, caregiving, bathing: Secondary | ICD-10-CM | POA: Insufficient documentation

## 2018-07-21 DIAGNOSIS — G8929 Other chronic pain: Secondary | ICD-10-CM | POA: Diagnosis not present

## 2018-07-21 DIAGNOSIS — Z79899 Other long term (current) drug therapy: Secondary | ICD-10-CM | POA: Diagnosis not present

## 2018-07-21 DIAGNOSIS — S39011A Strain of muscle, fascia and tendon of abdomen, initial encounter: Secondary | ICD-10-CM | POA: Diagnosis not present

## 2018-07-21 DIAGNOSIS — X500XXA Overexertion from strenuous movement or load, initial encounter: Secondary | ICD-10-CM | POA: Insufficient documentation

## 2018-07-21 DIAGNOSIS — M5431 Sciatica, right side: Secondary | ICD-10-CM | POA: Insufficient documentation

## 2018-07-21 DIAGNOSIS — O99332 Smoking (tobacco) complicating pregnancy, second trimester: Secondary | ICD-10-CM | POA: Diagnosis not present

## 2018-07-21 DIAGNOSIS — Y92012 Bathroom of single-family (private) house as the place of occurrence of the external cause: Secondary | ICD-10-CM | POA: Insufficient documentation

## 2018-07-21 DIAGNOSIS — R109 Unspecified abdominal pain: Secondary | ICD-10-CM | POA: Diagnosis not present

## 2018-07-21 DIAGNOSIS — F17210 Nicotine dependence, cigarettes, uncomplicated: Secondary | ICD-10-CM | POA: Insufficient documentation

## 2018-07-21 DIAGNOSIS — O9A212 Injury, poisoning and certain other consequences of external causes complicating pregnancy, second trimester: Secondary | ICD-10-CM | POA: Diagnosis present

## 2018-07-21 DIAGNOSIS — R1031 Right lower quadrant pain: Secondary | ICD-10-CM | POA: Insufficient documentation

## 2018-07-21 DIAGNOSIS — Z658 Other specified problems related to psychosocial circumstances: Secondary | ICD-10-CM | POA: Insufficient documentation

## 2018-07-21 DIAGNOSIS — O132 Gestational [pregnancy-induced] hypertension without significant proteinuria, second trimester: Secondary | ICD-10-CM | POA: Insufficient documentation

## 2018-07-21 DIAGNOSIS — O99352 Diseases of the nervous system complicating pregnancy, second trimester: Secondary | ICD-10-CM | POA: Insufficient documentation

## 2018-07-21 DIAGNOSIS — O26892 Other specified pregnancy related conditions, second trimester: Secondary | ICD-10-CM | POA: Insufficient documentation

## 2018-07-21 DIAGNOSIS — Z3A21 21 weeks gestation of pregnancy: Secondary | ICD-10-CM | POA: Insufficient documentation

## 2018-07-21 LAB — URINALYSIS, ROUTINE W REFLEX MICROSCOPIC
Bilirubin Urine: NEGATIVE
GLUCOSE, UA: NEGATIVE mg/dL
HGB URINE DIPSTICK: NEGATIVE
Ketones, ur: NEGATIVE mg/dL
Leukocytes, UA: NEGATIVE
Nitrite: NEGATIVE
PH: 7 (ref 5.0–8.0)
Protein, ur: NEGATIVE mg/dL
Specific Gravity, Urine: 1.018 (ref 1.005–1.030)

## 2018-07-21 MED ORDER — IBUPROFEN 800 MG PO TABS
400.0000 mg | ORAL_TABLET | Freq: Once | ORAL | Status: AC
  Administered 2018-07-21: 400 mg via ORAL
  Filled 2018-07-21: qty 1

## 2018-07-21 NOTE — Progress Notes (Signed)
Order placed as directed.

## 2018-07-21 NOTE — MAU Provider Note (Signed)
Chief Complaint:  Abdominal Pain   First Provider Initiated Contact with Patient 07/21/18 0051     HPI: Terri Estes is a 32 y.o. [id number] at 59w1dwho presents to maternity admissions reporting Lower abdominal pain since injuring it when lifting her 30 yr old disabled daughter out of the bathtub today.  Immediately felt a sharp pulling pain in RLQ.    Low back/sciatic pain has been ongoing for "a long time".  The only pain that is new is the RLQ/oblique abdominal pain which sometimes shoots down into the groin. She reports good fetal movement, denies LOF, vaginal bleeding, vaginal itching/burning, urinary symptoms, h/a, dizziness, n/v, diarrhea, constipation or fever/chills.  She denies headache, visual changes or RUQ abdominal pain.  Abdominal Pain  This is a new problem. The current episode started today. The onset quality is sudden. The problem has been unchanged. The pain is located in the RLQ. The pain is moderate. The quality of the pain is sharp and cramping. The abdominal pain does not radiate. Pertinent negatives include no constipation, diarrhea, dysuria or fever. The pain is aggravated by palpation, movement and certain positions. The pain is relieved by nothing. She has tried nothing for the symptoms.    RN Note: Pt states that she has been having back pain for two days. Pt states that after getting her daughter out of the bathtub at 1800 she felt like she pulled something in her lower stomach.  Denies vaginal bleeding, or LOF.  Reports good fetal movement.     Past Medical History: Past Medical History:  Diagnosis Date  . Chronic pain   . Pregnancy induced hypertension     Past obstetric history: OB History  Gravida Para Term Preterm AB Living  6 2 1 1 3 2   SAB TAB Ectopic Multiple Live Births    3     2    # Outcome Date GA Lbr Len/2nd Weight Sex Delivery Anes PTL Lv  6 Current           5 Term 07/10/17 [redacted]w[redacted]d  6 lb 9.5 oz (2.99 kg) M CS-LTranv EPI  LIV      Birth Comments: Right upper eyelid everted and swollen.  Right lower eyelid also swollen.  No drainage visible.  4 Preterm 05/26/08 [redacted]w[redacted]d  2 lb 4 oz (1.021 kg) F CS-LTranv  Y LIV     Birth Comments: cerebral palsy     Complications: Hypertension  3 TAB           2 TAB           1 TAB             Past Surgical History: Past Surgical History:  Procedure Laterality Date  . CESAREAN SECTION  05/26/2008  . CESAREAN SECTION N/A 07/10/2017   Procedure: CESAREAN SECTION;  Surgeon: Florian Buff, MD;  Location: [place];  Service: Obstetrics;  Laterality: N/A;  . WISDOM TOOTH EXTRACTION      Family History: Family History  Problem Relation Age of Onset  . Kidney disease Maternal Grandmother   . Kidney disease Maternal Grandfather   . Kidney disease Paternal Grandmother   . Diabetes Paternal Grandmother   . Kidney disease Paternal Grandfather     Social History: Social History   Tobacco Use  . Smoking status: Current Some Day Smoker    Packs/day: 0.50    Years: 5.00    Pack years: 2.50    Types: Cigarettes  . Smokeless tobacco: Never Used  Substance Use Topics  . Alcohol use: No  . Drug use: Not Currently    Comment: subutex    Allergies: No Known Allergies  Meds:  Medications Prior to Admission  Medication Sig Dispense Refill Last Dose  . albuterol (PROVENTIL HFA;VENTOLIN HFA) 108 (90 Base) MCG/ACT inhaler Inhale 2 puffs into the lungs 4 (four) times daily.   Taking  . aspirin EC 81 MG tablet Take 1 tablet (81 mg total) by mouth daily. Take after 12 weeks for prevention of preeclampsia later in pregnancy 300 tablet 2 Taking  . buprenorphine (SUBUTEX) 2 MG SUBL SL tablet Place 4 mg under the tongue at bedtime.   Taking  . buprenorphine (SUBUTEX) 8 MG SUBL SL tablet Place 8 mg under the tongue daily.    Taking  . Doxylamine-Pyridoxine (DICLEGIS) 10-10 MG TBEC Take 2 tablets by mouth at bedtime. If symptoms persist, add one tablet in the morning and one in the  afternoon 100 tablet 5 Taking  . ibuprofen (ADVIL,MOTRIN) 600 MG tablet Take 1 tablet (600 mg total) by mouth every 6 (six) hours. (Patient not taking: Reported on 07/06/2018) 30 tablet 0 Not Taking  . pantoprazole (PROTONIX) 40 MG tablet Take 1 tablet (40 mg total) by mouth daily. 30 tablet 6 07/07/2017 at Unknown time  . polyethylene glycol (MIRALAX / GLYCOLAX) packet Take 17 g by mouth daily as needed for mild constipation.    Taking  . Prenatal MV-Min-FA-Omega-3 (PRENATAL GUMMIES/DHA & FA) 0.4-32.5 MG CHEW Chew 2 each by mouth daily.   Taking    I have reviewed patient's Past Medical Hx, Surgical Hx, Family Hx, Social Hx, medications and allergies.   ROS:  Review of Systems  Constitutional: Negative for fever.  Gastrointestinal: Positive for abdominal pain. Negative for constipation and diarrhea.  Genitourinary: Positive for pelvic pain. Negative for dysuria and vaginal bleeding.  Musculoskeletal: Positive for back pain (sciatica, right).   Other systems negative  Physical Exam   Patient Vitals for the past 24 hrs:  BP Temp Pulse Resp SpO2 Weight  07/21/18 0035 114/81 98.9 F (37.2 C) 85 16 100 % -  07/21/18 0025 - - - - - 179 lb 12 oz (81.5 kg)   Constitutional: Well-developed, well-nourished female in no acute distress.  Cardiovascular: normal rate and rhythm Respiratory: normal effort, clear to auscultation bilaterally GI: Abd soft, non-tender, except over RLQ just above groin, gravid appropriate for gestational age.   No rebound or guarding.  There is a little edema over RLQ. MS: Extremities nontender, no edema, normal ROM Neurologic: Alert and oriented x 4.  GU: Neg CVAT.  PELVIC EXAM:  deferred  FHT:  148   Labs: Results for orders placed or performed during the hospital encounter of 07/21/18 (from the past 24 hour(s))  Urinalysis, Routine w reflex microscopic     Status: None   Collection Time: 07/21/18 12:25 AM  Result Value Ref Range   Color, Urine YELLOW YELLOW    APPearance CLEAR CLEAR   Specific Gravity, Urine 1.018 1.005 - 1.030   pH 7.0 5.0 - 8.0   Glucose, UA NEGATIVE NEGATIVE mg/dL   Hgb urine dipstick NEGATIVE NEGATIVE   Bilirubin Urine NEGATIVE NEGATIVE   Ketones, ur NEGATIVE NEGATIVE mg/dL   Protein, ur NEGATIVE NEGATIVE mg/dL   Nitrite NEGATIVE NEGATIVE   Leukocytes, UA NEGATIVE NEGATIVE   A/Positive/-- (05/15 1535)  Imaging:    MAU Course/MDM: I have ordered labs and reviewed results.  NST reviewed Tender over lower obliques esp on  right.  Excacerbated with head raise.  Some uterine cramping.  Treated with single dose of ibuprofen.  Recommend rest, pregnancy support belt (has one) Good body mechanics when lifting.  Treatments in MAU included single dose of ibuprofen 400mg  which provided some relief. Discussed ice to back. Rest, and use of support belt.    Assessment: Intrauterine pregnancy at [redacted]w[redacted]d Strain of abdominal muscle, initial encounter - Plan: Discharge patient  Sciatica of right side - Plan: Discharge patient   Plan: Discharge home Conservative care Pregnancy support belt Ice to back  Message sent to refer for PT Tylenol for pain Reviewed proper body mechanics Follow up in Office for prenatal visits and recheck of status Message sent to reschedule OB appt (missed last one)  Encouraged to return here or to other Urgent Care/ED if she develops worsening of symptoms, increase in pain, fever, or other concerning symptoms.   Pt stable at time of discharge.  Hansel Feinstein CNM, MSN Certified Nurse-Midwife 07/21/2018 12:51 AM

## 2018-07-21 NOTE — Telephone Encounter (Signed)
Telephone call to patient to schedule follow up ob appt. Patient not in, left message for her to call.

## 2018-07-21 NOTE — Telephone Encounter (Signed)
-----   Message from Seabron Spates, CNM sent at 07/21/2018  2:13 AM EDT ----- Regarding: need to r/s OB appt and refer to PT Missed last appt Needs OB appt r/s Needs COLPO ASAP  Needs referral to PT for sciatica   Thanks  [facility]

## 2018-07-21 NOTE — Discharge Instructions (Signed)
Adductor Muscle Strain An adductor muscle strain, also called a groin strain or pull, is an injury to the muscles or tendon on the upper inner part of the thigh. These muscles are called the adductor muscles or groin muscles. They are responsible for moving the leg across the body or pulling the legs together. A muscle strain occurs when a muscle is overstretched and some muscle fibers are torn. An adductor muscle strain can range from mild to severe, depending on how many muscle fibers are affected and whether the muscle fibers are partially or completely torn. Adductor muscle strains usually occur during exercise or participation in sports. The injury often happens when a sudden, violent force is placed on a muscle, stretching the muscle too far. A strain is more likely to occur when your muscles are not warmed up or if you are not properly conditioned. Depending on the severity of the muscle strain, recovery time may vary from a few weeks to several weeks. Severe injuries often require 4-6 weeks for recovery. In those cases, complete healing can take 4-5 months. What are the causes? This injury may be caused by:  Stretching the adductor muscles too far or too suddenly, often during side-to-side motion with an abrupt change in direction.  Putting repeated stress on the adductor muscles over a long period of time.  Performing vigorous activity without properly stretching the adductor muscles beforehand.  What are the signs or symptoms? Symptoms of this condition include:  Pain and tenderness in the groin area. This begins as sharp pain and persists as a dull ache.  A popping or snapping feeling when the injury occurs (for severe strains).  Swelling or bruising.  Muscle spasms.  Weakness in the leg.  Stiffness in the groin area with decreased ability to move the affected muscles.  How is this diagnosed? This condition may be diagnosed based on your symptoms, your description of how the  injury occurred, and a physical exam. X-rays are sometimes needed to rule out a broken bone or cartilage problems. A CT scan or MRI may be done if your health care provider suspects a complete muscle tear or needs to check for other injuries. How is this treated? An adductor strain will often heal on its own. Typically, treatment will involve protecting the injured area, rest, ice, pressure (compression), and elevation. This is often called PRICE therapy. Your health care provider may also prescribe medicines to help manage pain and swelling (anti-inflammatory medicine). You may be told to use crutches for the first few days to minimize your pain. Follow these instructions at home: [place]ille the muscle from being injured again.  Rest. Do not use the strained muscle if it causes pain.  If directed, apply ice to the injured area: ? Put ice in a plastic bag. ? Place a towel between your skin and the bag. ? Leave the ice on for 20 minutes, 2-3 times a day. Do this for the first 2 days after the injury.  Apply compression by wrapping the injured area with an elastic bandage as told by your health care provider.  Raise (elevate) the injured area above the level of your heart while you are sitting or lying down. General instructions  Take over-the-counter and prescription medicines only as told by your health care provider.  Walk, stretch, and do exercises as told by your health care provider. Only do these activities if you can do so without any pain. How is this prevented?  Warm up  and stretch before being active.  Cool down and stretch after being active.  Give your body time to rest between periods of activity.  Make sure to use equipment that fits you.  Be safe and responsible while being active to avoid slips and falls.  Maintain physical fitness, including: ? Proper conditioning in the adductor muscles. ? Overall strength, flexibility, and endurance. Contact a  health care provider if:  You have increased pain or swelling in the affected area.  Your symptoms are not improving or they are getting worse. This information is not intended to replace advice given to you by your health care provider. Make sure you discuss any questions you have with your health care provider. Document Released: 08/10/2004 Document Revised: 07/02/2016 Document Reviewed: 09/16/2015 Elsevier Interactive Patient Education  2018 [employer].  Sciatica Sciatica is pain, numbness, weakness, or tingling along the path of the sciatic nerve. The sciatic nerve starts in the lower back and runs down the back of each leg. The nerve controls the muscles in the lower leg and in the back of the knee. It also provides feeling (sensation) to the back of the thigh, the lower leg, and the sole of the foot. Sciatica is a symptom of another medical condition that pinches or puts pressure on the sciatic nerve. Generally, sciatica only affects one side of the body. Sciatica usually goes away on its own or with treatment. In some cases, sciatica may keep coming back (recur). What are the causes? This condition is caused by pressure on the sciatic nerve, or pinching of the sciatic nerve. This may be the result of:  A disk in between the bones of the spine (vertebrae) bulging out too far (herniated disk).  Age-related changes in the spinal disks (degenerative disk disease).  A pain disorder that affects a muscle in the buttock (piriformis syndrome).  Extra bone growth (bone spur) near the sciatic nerve.  An injury or break (fracture) of the pelvis.  Pregnancy.  Tumor (rare).  What increases the risk? The following factors may make you more likely to develop this condition:  Playing sports that place pressure or stress on the spine, such as football or weight lifting.  Having poor strength and flexibility.  A history of back injury.  A history of back surgery.  Sitting for long  periods of time.  Doing activities that involve repetitive bending or lifting.  Obesity.  What are the signs or symptoms? Symptoms can vary from mild to very severe, and they may include:  Any of these problems in the lower back, leg, hip, or buttock: ? Mild tingling or dull aches. ? Burning sensations. ? Sharp pains.  Numbness in the back of the calf or the sole of the foot.  Leg weakness.  Severe back pain that makes movement difficult.  These symptoms may get worse when you cough, sneeze, or laugh, or when you sit or stand for long periods of time. Being overweight may also make symptoms worse. In some cases, symptoms may recur over time. How is this diagnosed? This condition may be diagnosed based on:  Your symptoms.  A physical exam. Your health care provider may ask you to do certain movements to check whether those movements trigger your symptoms.  You may have tests, including: ? Blood tests. ? X-rays. ? MRI. ? CT scan.  How is this treated? In many cases, this condition improves on its own, without any treatment. However, treatment may include:  Reducing or modifying physical activity  during periods of pain.  Exercising and stretching to strengthen your abdomen and improve the flexibility of your spine.  Icing and applying heat to the affected area.  Medicines that help: ? To relieve pain and swelling. ? To relax your muscles.  Injections of medicines that help to relieve pain, irritation, and inflammation around the sciatic nerve (steroids).  Surgery.  Follow these instructions at home: Medicines  Take over-the-counter and prescription medicines only as told by your health care provider.  Do not drive or operate heavy machinery while taking prescription pain medicine. Managing pain  If directed, apply ice to the affected area. ? Put ice in a plastic bag. ? Place a towel between your skin and the bag. ? Leave the ice on for 20 minutes, 2-3 times  a day.  After icing, apply heat to the affected area before you exercise or as often as told by your health care provider. Use the heat source that your health care provider recommends, such as a moist heat pack or a heating pad. ? Place a towel between your skin and the heat source. ? Leave the heat on for 20-30 minutes. ? Remove the heat if your skin turns bright red. This is especially important if you are unable to feel pain, heat, or cold. You may have a greater risk of getting burned. Activity  Return to your normal activities as told by your health care provider. Ask your health care provider what activities are safe for you. ? Avoid activities that make your symptoms worse.  Take brief periods of rest throughout the day. Resting in a lying or standing position is usually better than sitting to rest. ? When you rest for longer periods, mix in some mild activity or stretching between periods of rest. This will help to prevent stiffness and pain. ? Avoid sitting for long periods of time without moving. Get up and move around at least one time each hour.  Exercise and stretch regularly, as told by your health care provider.  Do not lift anything that is heavier than 10 lb (4.5 kg) while you have symptoms of sciatica. When you do not have symptoms, you should still avoid heavy lifting, especially repetitive heavy lifting.  When you lift objects, always use proper lifting technique, which includes: ? Bending your knees. ? Keeping the load close to your body. ? Avoiding twisting. General instructions  Use good posture. ? Avoid leaning forward while sitting. ? Avoid hunching over while standing.  Maintain a healthy weight. Excess weight puts extra stress on your back and makes it difficult to maintain good posture.  Wear supportive, comfortable shoes. Avoid wearing high heels.  Avoid sleeping on a mattress that is too soft or too hard. A mattress that is firm enough to support your  back when you sleep may help to reduce your pain.  Keep all follow-up visits as told by your health care provider. This is important. Contact a health care provider if:  You have pain that wakes you up when you are sleeping.  You have pain that gets worse when you lie down.  Your pain is worse than you have experienced in the past.  Your pain lasts longer than 4 weeks.  You experience unexplained weight loss. Get help right away if:  You lose control of your bowel or bladder (incontinence).  You have: ? Weakness in your lower back, pelvis, buttocks, or legs that gets worse. ? Redness or swelling of your back. ? A burning  sensation when you urinate. This information is not intended to replace advice given to you by your health care provider. Make sure you discuss any questions you have with your health care provider. Document Released: 12/07/2001 Document Revised: 05/18/2016 Document Reviewed: 08/22/2015 Elsevier Interactive Patient Education  [employer].

## 2018-07-21 NOTE — MAU Note (Signed)
Pt states that she has been having back pain for two days.  Pt states that after getting her daughter out of the bathtub at 1800 she felt like she pulled something in her lower stomach.   Denies vaginal bleeding, or LOF.   Reports good fetal movement.

## 2018-07-24 ENCOUNTER — Telehealth: Payer: Self-pay

## 2018-07-24 NOTE — Telephone Encounter (Signed)
Returned call, no answer, left vm 

## 2018-07-25 ENCOUNTER — Ambulatory Visit (INDEPENDENT_AMBULATORY_CARE_PROVIDER_SITE_OTHER): Payer: Medicaid Other | Admitting: Obstetrics & Gynecology

## 2018-07-25 ENCOUNTER — Other Ambulatory Visit (HOSPITAL_COMMUNITY)
Admission: RE | Admit: 2018-07-25 | Discharge: 2018-07-25 | Disposition: A | Discharge status: Home / Self Care | Payer: Medicaid Other | Source: Ambulatory Visit | Attending: Obstetrics & Gynecology | Admitting: Obstetrics & Gynecology

## 2018-07-25 ENCOUNTER — Encounter: Payer: Self-pay | Admitting: *Deleted

## 2018-07-25 ENCOUNTER — Encounter: Payer: Self-pay | Admitting: Obstetrics & Gynecology

## 2018-07-25 VITALS — BP 122/85 | HR 86 | Wt 180.4 lb

## 2018-07-25 DIAGNOSIS — Z3482 Encounter for supervision of other normal pregnancy, second trimester: Secondary | ICD-10-CM

## 2018-07-25 DIAGNOSIS — R87613 High grade squamous intraepithelial lesion on cytologic smear of cervix (HGSIL): Secondary | ICD-10-CM | POA: Insufficient documentation

## 2018-07-25 NOTE — Progress Notes (Signed)
   PRENATAL VISIT NOTE  Subjective:  Terri Estes is a 32 y.o. [phone] at [redacted]w[redacted]d being seen today for ongoing prenatal care.  She is currently monitored for the following issues for this high-risk pregnancy and has History of cesarean delivery; HSIL (high grade squamous intraepithelial lesion) on Pap smear of cervix; Supervision of other normal pregnancy, antepartum; Short interval between pregnancies affecting pregnancy, antepartum; Hx of preeclampsia, prior pregnancy, currently pregnant; History of preterm delivery, currently pregnant; Depression affecting pregnancy, antepartum; History of substance abuse; Inadequate social support; Sciatica; and Abdominal muscle strain, initial encounter on their problem list.  Patient reports no complaints.  Contractions: Not present. Vag. Bleeding: None.  Movement: Present. Denies leaking of fluid.   The following portions of the patient's history were reviewed and updated as appropriate: allergies, current medications, past family history, past medical history, past social history, past surgical history and problem list. Problem list updated.  Objective:   Vitals:   07/25/18 1515  BP: 122/85  Pulse: 86  Weight: 180 lb 6.4 oz (81.8 kg)    Fetal Status:     Movement: Present     General:  Alert, oriented and cooperative. Patient is in no acute distress.  Skin: Skin is warm and dry. No rash noted.   Cardiovascular: Normal heart rate noted  Respiratory: Normal respiratory effort, no problems with respiration noted  Abdomen: Soft, gravid, appropriate for gestational age.  Pain/Pressure: Absent     Pelvic: Cervical exam performed        Extremities: Normal range of motion.  Edema: None  Mental Status: Normal mood and affect. Normal behavior. Normal judgment and thought content.   Assessment and Plan:  Pregnancy: [id number] at [redacted]w[redacted]d  1. HSIL (high grade squamous intraepithelial lesion) on Pap smear of cervix Colposcopy done today  Preterm labor  symptoms and general obstetric precautions including but not limited to vaginal bleeding, contractions, leaking of fluid and fetal movement were reviewed in detail with the patient. Please refer to After Visit Summary for other counseling recommendations.  Return in about 1 month (around 08/22/2018).  Future Appointments  Date Time Provider [place]  08/03/2018 12:45 PM Bricelyn [place] 2 [facility]    Emeterio Reeve, MD

## 2018-07-25 NOTE — Progress Notes (Signed)
Patient ID: Terri Estes, female   DOB: 1986/10/25, 32 y.o.   MRN: [id number]  Chief Complaint  Patient presents with  . Routine Prenatal Visit    HPI Terri Estes is a 32 y.o. female.  [id number]  HPI  Indications: Pap smear on May 2019 showed: high-grade squamous intraepithelial neoplasia  (HGSIL-encompassing moderate and severe dysplasia). Previous colposcopy: no. Prior cervical treatment: no treatment.  Past Medical History:  Diagnosis Date  . Chronic pain   . Pregnancy induced hypertension     Past Surgical History:  Procedure Laterality Date  . CESAREAN SECTION  05/26/2008  . CESAREAN SECTION N/A 07/10/2017   Procedure: CESAREAN SECTION;  Surgeon: Florian Buff, MD;  Location: [facility];  Service: Obstetrics;  Laterality: N/A;  . WISDOM TOOTH EXTRACTION      Family History  Problem Relation Age of Onset  . Kidney disease Maternal Grandmother   . Kidney disease Maternal Grandfather   . Kidney disease Paternal Grandmother   . Diabetes Paternal Grandmother   . Kidney disease Paternal Grandfather     Social History Social History   Tobacco Use  . Smoking status: Current Some Day Smoker    Packs/day: 0.50    Years: 5.00    Pack years: 2.50    Types: Cigarettes  . Smokeless tobacco: Never Used  Substance Use Topics  . Alcohol use: No  . Drug use: Not Currently    Comment: subutex    No Known Allergies  Current Outpatient Medications  Medication Sig Dispense Refill  . albuterol (PROVENTIL HFA;VENTOLIN HFA) 108 (90 Base) MCG/ACT inhaler Inhale 2 puffs into the lungs 4 (four) times daily.    Marland Kitchen aspirin EC 81 MG tablet Take 1 tablet (81 mg total) by mouth daily. Take after 12 weeks for prevention of preeclampsia later in pregnancy 300 tablet 2  . buprenorphine (SUBUTEX) 2 MG SUBL SL tablet Place 4 mg under the tongue at bedtime.    . buprenorphine (SUBUTEX) 8 MG SUBL SL tablet Place 8 mg under the tongue daily.     . Doxylamine-Pyridoxine  (DICLEGIS) 10-10 MG TBEC Take 2 tablets by mouth at bedtime. If symptoms persist, add one tablet in the morning and one in the afternoon 100 tablet 5  . pantoprazole (PROTONIX) 40 MG tablet Take 1 tablet (40 mg total) by mouth daily. 30 tablet 6  . polyethylene glycol (MIRALAX / GLYCOLAX) packet Take 17 g by mouth daily as needed for mild constipation.     . Prenatal MV-Min-FA-Omega-3 (PRENATAL GUMMIES/DHA & FA) 0.4-32.5 MG CHEW Chew 2 each by mouth daily.    . sertraline (ZOLOFT) 100 MG tablet TK 1 T PO QD  0   No current facility-administered medications for this visit.     Review of Systems Review of Systems  Blood pressure 122/85, pulse 86, weight 180 lb 6.4 oz (81.8 kg), last menstrual period 02/23/2018, unknown if currently breastfeeding.  Physical Exam Physical Exam  Data Reviewed Pap results  Assessment    Procedure Details  The risks and benefits of the procedure and Written informed consent obtained.  Speculum placed in vagina and excellent visualization of cervix achieved, cervix swabbed x 3 with acetic acid solution. AWE at 4 to 8 with mild punctation Specimens: Bx at 347  Complications: none.   Monsels applied  Plan    Specimens labelled and sent to Pathology. Call to discuss Pathology results in 2 weeks.      Emeterio Reeve 07/25/2018, 4:11 PM

## 2018-07-25 NOTE — Patient Instructions (Signed)
Colposcopy, Care After  This sheet gives you information about how to care for yourself after your procedure. Your doctor may also give you more specific instructions. If you have problems or questions, contact your doctor.  What can I expect after the procedure?  If you did not have a tissue sample removed (did not have a biopsy), you may only have some spotting for a few days. You can go back to your normal activities.  If you had a tissue sample removed, it is common to have:  · Soreness and pain. This may last for a few days.  · Light-headedness.  · Mild bleeding from your vagina or dark-colored, grainy discharge from your vagina. This may last for a few days. You may need to wear a sanitary pad.  · Spotting for at least 48 hours after the procedure.    Follow these instructions at home:  · Take over-the-counter and prescription medicines only as told by your doctor. Ask your doctor what medicines you can start taking again. This is very important if you take blood-thinning medicine.  · Do not drive or use heavy machinery while taking prescription pain medicine.  · For 3 days, or as long as your doctor tells you, avoid:  ? Douching.  ? Using tampons.  ? Having sex.  · If you use birth control (contraception), keep using it.  · Limit activity for the first day after the procedure. Ask your doctor what activities are safe for you.  · It is up to you to get the results of your procedure. Ask your doctor when your results will be ready.  · Keep all follow-up visits as told by your doctor. This is important.  Contact a doctor if:  · You get a skin rash.  Get help right away if:  · You are bleeding a lot from your vagina. It is a lot of bleeding if you are using more than one pad an hour for 2 hours in a row.  · You have clumps of blood (blood clots) coming from your vagina.  · You have a fever.  · You have chills  · You have pain in your lower belly (pelvic area).  · You have signs of infection, such as vaginal  discharge that is:  ? Different than usual.  ? Yellow.  ? Bad-smelling.  · You have very pain or cramps in your lower belly that do not get better with medicine.  · You feel light-headed.  · You feel dizzy.  · You pass out (faint).  Summary  · If you did not have a tissue sample removed (did not have a biopsy), you may only have some spotting for a few days. You can go back to your normal activities.  · If you had a tissue sample removed, it is common to have mild pain and spotting for 48 hours.  · For 3 days, or as long as your doctor tells you, avoid douching, using tampons and having sex.  · Get help right away if you have bleeding, very bad pain, or signs of infection.  This information is not intended to replace advice given to you by your health care provider. Make sure you discuss any questions you have with your health care provider.  Document Released: 05/31/2008 Document Revised: 09/01/2016 Document Reviewed: 09/01/2016  Elsevier Interactive Patient Education © 2018 Else[employer] Inc[employer]

## 2018-07-27 ENCOUNTER — Encounter: Payer: Medicaid Other | Admitting: Obstetrics and Gynecology

## 2018-07-28 ENCOUNTER — Telehealth: Payer: Self-pay

## 2018-07-28 ENCOUNTER — Encounter (HOSPITAL_COMMUNITY): Payer: Self-pay | Admitting: *Deleted

## 2018-07-28 ENCOUNTER — Inpatient Hospital Stay (HOSPITAL_COMMUNITY)
Admission: AD | Admit: 2018-07-28 | Discharge: 2018-07-28 | Disposition: A | Discharge status: Home / Self Care | Payer: Medicaid Other | Source: Ambulatory Visit | Attending: Obstetrics & Gynecology | Admitting: Obstetrics & Gynecology

## 2018-07-28 DIAGNOSIS — Z9889 Other specified postprocedural states: Secondary | ICD-10-CM | POA: Insufficient documentation

## 2018-07-28 DIAGNOSIS — O99332 Smoking (tobacco) complicating pregnancy, second trimester: Secondary | ICD-10-CM | POA: Diagnosis not present

## 2018-07-28 DIAGNOSIS — Z7982 Long term (current) use of aspirin: Secondary | ICD-10-CM | POA: Insufficient documentation

## 2018-07-28 DIAGNOSIS — O26892 Other specified pregnancy related conditions, second trimester: Secondary | ICD-10-CM | POA: Diagnosis not present

## 2018-07-28 DIAGNOSIS — Z3A22 22 weeks gestation of pregnancy: Secondary | ICD-10-CM | POA: Diagnosis not present

## 2018-07-28 DIAGNOSIS — N898 Other specified noninflammatory disorders of vagina: Secondary | ICD-10-CM

## 2018-07-28 DIAGNOSIS — Z79899 Other long term (current) drug therapy: Secondary | ICD-10-CM | POA: Insufficient documentation

## 2018-07-28 DIAGNOSIS — F17210 Nicotine dependence, cigarettes, uncomplicated: Secondary | ICD-10-CM | POA: Diagnosis not present

## 2018-07-28 HISTORY — DX: Unspecified asthma, uncomplicated: J45.909

## 2018-07-28 HISTORY — DX: Trichomoniasis, unspecified: A59.9

## 2018-07-28 HISTORY — DX: Unspecified abnormal cytological findings in specimens from vagina: R87.629

## 2018-07-28 LAB — URINALYSIS, ROUTINE W REFLEX MICROSCOPIC
Bilirubin Urine: NEGATIVE
GLUCOSE, UA: NEGATIVE mg/dL
HGB URINE DIPSTICK: NEGATIVE
KETONES UR: NEGATIVE mg/dL
Leukocytes, UA: NEGATIVE
Nitrite: NEGATIVE
Protein, ur: NEGATIVE mg/dL
Specific Gravity, Urine: 1.02 (ref 1.005–1.030)
pH: 6 (ref 5.0–8.0)

## 2018-07-28 MED ORDER — IBUPROFEN 800 MG PO TABS
800.0000 mg | ORAL_TABLET | Freq: Once | ORAL | Status: AC
  Administered 2018-07-28: 800 mg via ORAL
  Filled 2018-07-28: qty 1

## 2018-07-28 NOTE — Discharge Instructions (Signed)
Second Trimester of Pregnancy The second trimester is from week 13 through week 28, month 4 through 6. This is often the time in pregnancy that you feel your best. Often times, morning sickness has lessened or quit. You may have more energy, and you may get hungry more often. Your unborn baby (fetus) is growing rapidly. At the end of the sixth month, he or she is about 9 inches long and weighs about 1 pounds. You will likely feel the baby move (quickening) between 18 and 20 weeks of pregnancy. Follow these instructions at home:  Avoid all smoking, herbs, and alcohol. Avoid drugs not approved by your doctor.  Do not use any tobacco products, including cigarettes, chewing tobacco, and electronic cigarettes. If you need help quitting, ask your doctor. You may get counseling or other support to help you quit.  Only take medicine as told by your doctor. Some medicines are safe and some are not during pregnancy.  Exercise only as told by your doctor. Stop exercising if you start having cramps.  Eat regular, healthy meals.  Wear a good support bra if your breasts are tender.  Do not use hot tubs, steam rooms, or saunas.  Wear your seat belt when driving.  Avoid raw meat, uncooked cheese, and liter boxes and soil used by cats.  Take your prenatal vitamins.  Take 1500-2000 milligrams of calcium daily starting at the 20th week of pregnancy until you deliver your baby.  Try taking medicine that helps you poop (stool softener) as needed, and if your doctor approves. Eat more fiber by eating fresh fruit, vegetables, and whole grains. Drink enough fluids to keep your pee (urine) clear or pale yellow.  Take warm water baths (sitz baths) to soothe pain or discomfort caused by hemorrhoids. Use hemorrhoid cream if your doctor approves.  If you have puffy, bulging veins (varicose veins), wear support hose. Raise (elevate) your feet for 15 minutes, 3-4 times a day. Limit salt in your diet.  Avoid heavy  lifting, wear low heals, and sit up straight.  Rest with your legs raised if you have leg cramps or low back pain.  Visit your dentist if you have not gone during your pregnancy. Use a soft toothbrush to brush your teeth. Be gentle when you floss.  You can have sex (intercourse) unless your doctor tells you not to.  Go to your doctor visits. Get help if:  You feel dizzy.  You have mild cramps or pressure in your lower belly (abdomen).  You have a nagging pain in your belly area.  You continue to feel sick to your stomach (nauseous), throw up (vomit), or have watery poop (diarrhea).  You have bad smelling fluid coming from your vagina.  You have pain with peeing (urination). Get help right away if:  You have a fever.  You are leaking fluid from your vagina.  You have spotting or bleeding from your vagina.  You have severe belly cramping or pain.  You lose or gain weight rapidly.  You have trouble catching your breath and have chest pain.  You notice sudden or extreme puffiness (swelling) of your face, hands, ankles, feet, or legs.  You have not felt the baby move in over an hour.  You have severe headaches that do not go away with medicine.  You have vision changes. This information is not intended to replace advice given to you by your health care provider. Make sure you discuss any questions you have with your health care   provider. Document Released: 03/09/2010 Document Revised: 05/20/2016 Document Reviewed: 02/13/2013 Elsevier Interactive Patient Education  2017 [employer] Inc.  

## 2018-07-28 NOTE — MAU Note (Signed)
Pt states she had a colposcopy done on Tuesday @ [facility].  Yesterday she passed ? Tissue, large amount, orange/red.  Had been passing some stringy clots & "black dots."  Has not passed anything as large since yesterday, denies bright red bleeding.   Having some lower abd cramping.

## 2018-07-28 NOTE — Telephone Encounter (Signed)
Patient called and s/w front office staff, advising that she had a colpo and a large mass like clot came out. Patient is extremely worried, informed pt that provider advised to be evaluated at [facility].

## 2018-07-28 NOTE — MAU Provider Note (Signed)
History     CSN: [id number]  Arrival date and time: 07/28/18 1747   First Provider Initiated Contact with Patient 07/28/18 1915      Chief Complaint  Patient presents with  . Vaginal Discharge   Terri Estes is a 32 y.o. [id number] at [redacted]w[redacted]d who presents today with vaginal discharge. She had colpo done on 07/25/18 and has had a lot of brown discharge since. Today she had a large clump of "something gooey" come out. She has also had cramping.   Vaginal Discharge  The patient's primary symptoms include pelvic pain and vaginal discharge. This is a new problem. The current episode started in the past 7 days. The problem occurs intermittently. The problem has been unchanged. Pain severity now: 7/10. The problem affects both sides. She is pregnant. Pertinent negatives include no chills, dysuria, fever, frequency, nausea or vomiting. The vaginal discharge was brown and mucoid. There has been no bleeding. Nothing aggravates the symptoms. She has tried nothing for the symptoms.    OB History    Gravida  6   Para  2   Term  1   Preterm  1   AB  3   Living  2     SAB      TAB  3   Ectopic      Multiple      Live Births  2           Past Medical History:  Diagnosis Date  . Asthma   . Chronic pain   . Pregnancy induced hypertension   . Trichomonas infection   . Vaginal Pap smear, abnormal     Past Surgical History:  Procedure Laterality Date  . CESAREAN SECTION  05/26/2008  . CESAREAN SECTION N/A 07/10/2017   Procedure: CESAREAN SECTION;  Surgeon: Florian Buff, MD;  Location: [facility];  Service: Obstetrics;  Laterality: N/A;  . COLPOSCOPY    . WISDOM TOOTH EXTRACTION      Family History  Problem Relation Age of Onset  . Kidney disease Maternal Grandmother   . Kidney disease Maternal Grandfather   . Kidney disease Paternal Grandmother   . Diabetes Paternal Grandmother   . Kidney disease Paternal Grandfather     Social History   Tobacco Use  .  Smoking status: Current Some Day Smoker    Packs/day: 0.25    Years: 5.00    Pack years: 1.25    Types: Cigarettes  . Smokeless tobacco: Never Used  Substance Use Topics  . Alcohol use: No  . Drug use: Not Currently    Comment: subutex    Allergies: No Known Allergies  Medications Prior to Admission  Medication Sig Dispense Refill Last Dose  . albuterol (PROVENTIL HFA;VENTOLIN HFA) 108 (90 Base) MCG/ACT inhaler Inhale 2 puffs into the lungs 4 (four) times daily.   Taking  . aspirin EC 81 MG tablet Take 1 tablet (81 mg total) by mouth daily. Take after 12 weeks for prevention of preeclampsia later in pregnancy 300 tablet 2 Taking  . buprenorphine (SUBUTEX) 2 MG SUBL SL tablet Place 4 mg under the tongue at bedtime.   Taking  . buprenorphine (SUBUTEX) 8 MG SUBL SL tablet Place 8 mg under the tongue daily.    Taking  . Doxylamine-Pyridoxine (DICLEGIS) 10-10 MG TBEC Take 2 tablets by mouth at bedtime. If symptoms persist, add one tablet in the morning and one in the afternoon 100 tablet 5 Taking  . pantoprazole (PROTONIX) 40 MG tablet  Take 1 tablet (40 mg total) by mouth daily. 30 tablet 6 07/07/2017 at Unknown time  . polyethylene glycol (MIRALAX / GLYCOLAX) packet Take 17 g by mouth daily as needed for mild constipation.    Taking  . Prenatal MV-Min-FA-Omega-3 (PRENATAL GUMMIES/DHA & FA) 0.4-32.5 MG CHEW Chew 2 each by mouth daily.   Taking  . sertraline (ZOLOFT) 100 MG tablet TK 1 T PO QD  0     Review of Systems  Constitutional: Negative for chills and fever.  Gastrointestinal: Negative for nausea and vomiting.  Genitourinary: Positive for pelvic pain and vaginal discharge. Negative for dysuria, frequency and vaginal bleeding.   Physical Exam   Blood pressure 111/69, pulse 72, temperature 98.2 F (36.8 C), temperature source Oral, resp. rate 16, height 5\' 2"  (1.575 m), weight 182 lb 1.9 oz (82.6 kg), last menstrual period 02/23/2018, unknown if currently breastfeeding.  Physical  Exam  Nursing note and vitals reviewed. Constitutional: She is oriented to person, place, and time. She appears well-developed and well-nourished. No distress.  HENT:  Head: Normocephalic.  Cardiovascular: Normal rate.  Respiratory: Effort normal.  GI: Soft. There is no tenderness. There is no rebound.  Genitourinary:  Genitourinary Comments:  External: no lesion Vagina: small amount of white discharge Cervix: pink, smooth, well healed, closed/thick  Uterus: AGA +FHT 142 with doppler    Neurological: She is alert and oriented to person, place, and time.  Skin: Skin is warm and dry.  Psychiatric: She has a normal mood and affect.    MAU Course  Procedures  MDM Patient has had ibuprofen for cramping. She reports that she is feeling better. Reassured that cervix is closed, and healing well from the colpo.   Assessment and Plan   1. Vaginal discharge during pregnancy in second trimester   2. Status post colposcopy   3. [redacted] weeks gestation of pregnancy    DC home Comfort measures reviewed  2nd/3rd Trimester precautions  Bleeding precautions PTL precautions  Fetal kick counts RX: none  Return to MAU as needed FU with OB as planned  [facility] for [facility] Follow up.   Specialty:  Obstetrics and Gynecology Contact information: [place] 07/28/2018, 7:24 PM

## 2018-08-03 ENCOUNTER — Ambulatory Visit (HOSPITAL_COMMUNITY): Payer: Medicaid Other

## 2018-08-17 ENCOUNTER — Ambulatory Visit (HOSPITAL_COMMUNITY)
Admission: RE | Admit: 2018-08-17 | Discharge: 2018-08-17 | Disposition: A | Discharge status: Home / Self Care | Payer: Medicaid Other | Source: Ambulatory Visit | Attending: Obstetrics and Gynecology | Admitting: Obstetrics and Gynecology

## 2018-08-17 ENCOUNTER — Encounter (HOSPITAL_COMMUNITY): Payer: Self-pay

## 2018-08-17 ENCOUNTER — Other Ambulatory Visit (HOSPITAL_COMMUNITY): Payer: Self-pay | Admitting: *Deleted

## 2018-08-17 DIAGNOSIS — O34219 Maternal care for unspecified type scar from previous cesarean delivery: Secondary | ICD-10-CM

## 2018-08-17 DIAGNOSIS — O09899 Supervision of other high risk pregnancies, unspecified trimester: Secondary | ICD-10-CM

## 2018-08-17 DIAGNOSIS — O99323 Drug use complicating pregnancy, third trimester: Secondary | ICD-10-CM | POA: Insufficient documentation

## 2018-08-17 DIAGNOSIS — O09299 Supervision of pregnancy with other poor reproductive or obstetric history, unspecified trimester: Secondary | ICD-10-CM

## 2018-08-17 DIAGNOSIS — O09292 Supervision of pregnancy with other poor reproductive or obstetric history, second trimester: Secondary | ICD-10-CM | POA: Diagnosis not present

## 2018-08-17 DIAGNOSIS — F1120 Opioid dependence, uncomplicated: Secondary | ICD-10-CM | POA: Diagnosis present

## 2018-08-17 DIAGNOSIS — O09219 Supervision of pregnancy with history of pre-term labor, unspecified trimester: Secondary | ICD-10-CM

## 2018-08-17 DIAGNOSIS — F329 Major depressive disorder, single episode, unspecified: Secondary | ICD-10-CM

## 2018-08-17 DIAGNOSIS — O99320 Drug use complicating pregnancy, unspecified trimester: Principal | ICD-10-CM

## 2018-08-17 DIAGNOSIS — O99322 Drug use complicating pregnancy, second trimester: Secondary | ICD-10-CM

## 2018-08-17 DIAGNOSIS — S39011A Strain of muscle, fascia and tendon of abdomen, initial encounter: Secondary | ICD-10-CM

## 2018-08-17 DIAGNOSIS — O09212 Supervision of pregnancy with history of pre-term labor, second trimester: Secondary | ICD-10-CM | POA: Diagnosis not present

## 2018-08-17 DIAGNOSIS — Z3A25 25 weeks gestation of pregnancy: Secondary | ICD-10-CM | POA: Diagnosis not present

## 2018-08-17 DIAGNOSIS — F32A Depression, unspecified: Secondary | ICD-10-CM

## 2018-08-17 DIAGNOSIS — F1911 Other psychoactive substance abuse, in remission: Secondary | ICD-10-CM

## 2018-08-17 DIAGNOSIS — O99340 Other mental disorders complicating pregnancy, unspecified trimester: Secondary | ICD-10-CM

## 2018-08-20 ENCOUNTER — Encounter (HOSPITAL_COMMUNITY): Payer: Self-pay | Admitting: *Deleted

## 2018-08-20 ENCOUNTER — Inpatient Hospital Stay (HOSPITAL_COMMUNITY)
Admission: AD | Admit: 2018-08-20 | Discharge: 2018-08-20 | Disposition: A | Discharge status: Home / Self Care | Payer: Medicaid Other | Source: Ambulatory Visit | Attending: Obstetrics and Gynecology | Admitting: Obstetrics and Gynecology

## 2018-08-20 DIAGNOSIS — Z79899 Other long term (current) drug therapy: Secondary | ICD-10-CM | POA: Insufficient documentation

## 2018-08-20 DIAGNOSIS — O99342 Other mental disorders complicating pregnancy, second trimester: Secondary | ICD-10-CM | POA: Insufficient documentation

## 2018-08-20 DIAGNOSIS — S39011A Strain of muscle, fascia and tendon of abdomen, initial encounter: Secondary | ICD-10-CM

## 2018-08-20 DIAGNOSIS — F329 Major depressive disorder, single episode, unspecified: Secondary | ICD-10-CM | POA: Diagnosis not present

## 2018-08-20 DIAGNOSIS — R102 Pelvic and perineal pain: Secondary | ICD-10-CM | POA: Diagnosis not present

## 2018-08-20 DIAGNOSIS — F1911 Other psychoactive substance abuse, in remission: Secondary | ICD-10-CM

## 2018-08-20 DIAGNOSIS — N949 Unspecified condition associated with female genital organs and menstrual cycle: Secondary | ICD-10-CM

## 2018-08-20 DIAGNOSIS — O26892 Other specified pregnancy related conditions, second trimester: Secondary | ICD-10-CM

## 2018-08-20 DIAGNOSIS — O9A212 Injury, poisoning and certain other consequences of external causes complicating pregnancy, second trimester: Secondary | ICD-10-CM | POA: Insufficient documentation

## 2018-08-20 DIAGNOSIS — O09899 Supervision of other high risk pregnancies, unspecified trimester: Secondary | ICD-10-CM

## 2018-08-20 DIAGNOSIS — O09219 Supervision of pregnancy with history of pre-term labor, unspecified trimester: Secondary | ICD-10-CM

## 2018-08-20 DIAGNOSIS — G8929 Other chronic pain: Secondary | ICD-10-CM | POA: Diagnosis not present

## 2018-08-20 DIAGNOSIS — F32A Depression, unspecified: Secondary | ICD-10-CM

## 2018-08-20 DIAGNOSIS — J45909 Unspecified asthma, uncomplicated: Secondary | ICD-10-CM | POA: Insufficient documentation

## 2018-08-20 DIAGNOSIS — O09299 Supervision of pregnancy with other poor reproductive or obstetric history, unspecified trimester: Secondary | ICD-10-CM

## 2018-08-20 DIAGNOSIS — Z98891 History of uterine scar from previous surgery: Secondary | ICD-10-CM

## 2018-08-20 DIAGNOSIS — Z3A25 25 weeks gestation of pregnancy: Secondary | ICD-10-CM | POA: Diagnosis not present

## 2018-08-20 DIAGNOSIS — X58XXXA Exposure to other specified factors, initial encounter: Secondary | ICD-10-CM | POA: Insufficient documentation

## 2018-08-20 DIAGNOSIS — O99512 Diseases of the respiratory system complicating pregnancy, second trimester: Secondary | ICD-10-CM | POA: Diagnosis not present

## 2018-08-20 DIAGNOSIS — O99340 Other mental disorders complicating pregnancy, unspecified trimester: Secondary | ICD-10-CM

## 2018-08-20 DIAGNOSIS — Z7982 Long term (current) use of aspirin: Secondary | ICD-10-CM | POA: Insufficient documentation

## 2018-08-20 DIAGNOSIS — R109 Unspecified abdominal pain: Secondary | ICD-10-CM | POA: Diagnosis present

## 2018-08-20 DIAGNOSIS — O99332 Smoking (tobacco) complicating pregnancy, second trimester: Secondary | ICD-10-CM | POA: Insufficient documentation

## 2018-08-20 LAB — URINALYSIS, ROUTINE W REFLEX MICROSCOPIC
Bilirubin Urine: NEGATIVE
GLUCOSE, UA: NEGATIVE mg/dL
Hgb urine dipstick: NEGATIVE
Ketones, ur: NEGATIVE mg/dL
Leukocytes, UA: NEGATIVE
Nitrite: NEGATIVE
Protein, ur: NEGATIVE mg/dL
SPECIFIC GRAVITY, URINE: 1.003 — AB (ref 1.005–1.030)
pH: 7 (ref 5.0–8.0)

## 2018-08-20 MED ORDER — CYCLOBENZAPRINE HCL 10 MG PO TABS: 10.0000 mg | ORAL_TABLET | Freq: Three times a day (TID) | ORAL | 0 refills | Status: DC | PRN

## 2018-08-20 MED ORDER — CYCLOBENZAPRINE HCL 10 MG PO TABS
10.0000 mg | ORAL_TABLET | Freq: Once | ORAL | Status: AC
  Administered 2018-08-20: 10 mg via ORAL
  Filled 2018-08-20: qty 1

## 2018-08-20 NOTE — Discharge Instructions (Signed)

## 2018-08-20 NOTE — MAU Note (Signed)
Pt admitted by EMS to rm #3. Has had abd pain and lower back pain for a wk. Worse now. Denies LOF or bleeding. Pain is worse with walking. Having a lot of pressure in vagina
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
## 2018-08-23 ENCOUNTER — Ambulatory Visit (INDEPENDENT_AMBULATORY_CARE_PROVIDER_SITE_OTHER): Payer: Medicaid Other | Admitting: Obstetrics and Gynecology

## 2018-08-23 ENCOUNTER — Encounter: Payer: Self-pay | Admitting: Obstetrics and Gynecology

## 2018-08-23 VITALS — BP 131/88 | HR 92 | Wt 189.0 lb

## 2018-08-23 DIAGNOSIS — R87613 High grade squamous intraepithelial lesion on cytologic smear of cervix (HGSIL): Secondary | ICD-10-CM

## 2018-08-23 DIAGNOSIS — F1911 Other psychoactive substance abuse, in remission: Secondary | ICD-10-CM

## 2018-08-23 DIAGNOSIS — Z3480 Encounter for supervision of other normal pregnancy, unspecified trimester: Secondary | ICD-10-CM

## 2018-08-23 DIAGNOSIS — Z98891 History of uterine scar from previous surgery: Secondary | ICD-10-CM

## 2018-08-23 DIAGNOSIS — Z87898 Personal history of other specified conditions: Secondary | ICD-10-CM

## 2018-08-23 DIAGNOSIS — O09299 Supervision of pregnancy with other poor reproductive or obstetric history, unspecified trimester: Secondary | ICD-10-CM

## 2018-08-23 MED ORDER — CYCLOBENZAPRINE HCL 10 MG PO TABS: 10.0000 mg | ORAL_TABLET | Freq: Three times a day (TID) | ORAL | 2 refills | Status: DC | PRN

## 2018-08-23 NOTE — Progress Notes (Signed)
Subjective:  Terri Estes is a 32 y.o. [phone] at [redacted]w[redacted]d being seen today for ongoing prenatal care.  She is currently monitored for the following issues for this high-risk pregnancy and has History of cesarean delivery; HSIL (high grade squamous intraepithelial lesion) on Pap smear of cervix; Supervision of other normal pregnancy, antepartum; Short interval between pregnancies affecting pregnancy, antepartum; Hx of preeclampsia, prior pregnancy, currently pregnant; History of preterm delivery, currently pregnant; Depression affecting pregnancy, antepartum; History of substance abuse; Inadequate social support; Sciatica; and Abdominal muscle strain, initial encounter on their problem list.  Patient reports seen in MAU for round ligament pain. Doing better with Flexeril. Requests refills.  Contractions: Irritability. Vag. Bleeding: Bloody Show.  Movement: Present. Denies leaking of fluid.   The following portions of the patient's history were reviewed and updated as appropriate: allergies, current medications, past family history, past medical history, past social history, past surgical history and problem list. Problem list updated.  Objective:   Vitals:   08/23/18 1051  BP: 131/88  Pulse: 92  Weight: 189 lb (85.7 kg)    Fetal Status: Fetal Heart Rate (bpm): 142   Movement: Present     General:  Alert, oriented and cooperative. Patient is in no acute distress.  Skin: Skin is warm and dry. No rash noted.   Cardiovascular: Normal heart rate noted  Respiratory: Normal respiratory effort, no problems with respiration noted  Abdomen: Soft, gravid, appropriate for gestational age. Pain/Pressure: Present     Pelvic:  Cervical exam deferred        Extremities: Normal range of motion.  Edema: Trace  Mental Status: Normal mood and affect. Normal behavior. Normal judgment and thought content.   Urinalysis:      Assessment and Plan:  Pregnancy: [id number] at [redacted]w[redacted]d  1. Supervision of other normal  pregnancy, antepartum Stable Refilled Flexeril  2. Hx of preeclampsia, prior pregnancy, currently pregnant No S/Sx of PEC BP stable Continue with BASA  3. HSIL (high grade squamous intraepithelial lesion) on Pap smear of cervix S/P Colpo Needs LEEP PP Reviewed with pt  4. History of substance abuse Stable on Subutex Will need NICU tour  5. History of cesarean delivery For repeat at 39 weeks d/t 2 prior c sections  Preterm labor symptoms and general obstetric precautions including but not limited to vaginal bleeding, contractions, leaking of fluid and fetal movement were reviewed in detail with the patient. Please refer to After Visit Summary for other counseling recommendations.  Return in about 3 weeks (around 09/13/2018) for OB visit.   Chancy Milroy, MD

## 2018-08-23 NOTE — Patient Instructions (Signed)

## 2018-08-23 NOTE — Progress Notes (Signed)
Pt is G6P2 [redacted]w[redacted]d here for ROB. Pt is CO of round ligament pain, states flexeril is helping with this.

## 2018-09-13 ENCOUNTER — Other Ambulatory Visit: Payer: Medicaid Other

## 2018-09-13 ENCOUNTER — Ambulatory Visit (INDEPENDENT_AMBULATORY_CARE_PROVIDER_SITE_OTHER): Payer: Medicaid Other | Admitting: Obstetrics & Gynecology

## 2018-09-13 VITALS — BP 129/88 | HR 93 | Wt 192.9 lb

## 2018-09-13 DIAGNOSIS — Z23 Encounter for immunization: Secondary | ICD-10-CM

## 2018-09-13 DIAGNOSIS — Z3483 Encounter for supervision of other normal pregnancy, third trimester: Secondary | ICD-10-CM

## 2018-09-13 DIAGNOSIS — Z3480 Encounter for supervision of other normal pregnancy, unspecified trimester: Secondary | ICD-10-CM

## 2018-09-13 MED ORDER — CYCLOBENZAPRINE HCL 10 MG PO TABS: 10.0000 mg | ORAL_TABLET | Freq: Three times a day (TID) | ORAL | 2 refills | Status: DC | PRN

## 2018-09-13 NOTE — Progress Notes (Signed)
   PRENATAL VISIT NOTE  Subjective:  Terri Estes is a 32 y.o. [phone] at [redacted]w[redacted]d being seen today for ongoing prenatal care.  She is currently monitored for the following issues for this low-risk pregnancy and has History of cesarean delivery; HSIL (high grade squamous intraepithelial lesion) on Pap smear of cervix; Supervision of other normal pregnancy, antepartum; Short interval between pregnancies affecting pregnancy, antepartum; Hx of preeclampsia, prior pregnancy, currently pregnant; History of preterm delivery, currently pregnant; Depression affecting pregnancy, antepartum; History of substance abuse; Inadequate social support; Sciatica; and Abdominal muscle strain, initial encounter on their problem list.  Patient reports no complaints.  Contractions: Irritability. Vag. Bleeding: None.  Movement: Present. Denies leaking of fluid.   The following portions of the patient's history were reviewed and updated as appropriate: allergies, current medications, past family history, past medical history, past social history, past surgical history and problem list. Problem list updated.  Objective:   Vitals:   09/13/18 0937  BP: 129/88  Pulse: 93  Weight: 192 lb 14.4 oz (87.5 kg)    Fetal Status: Fetal Heart Rate (bpm): 145 Fundal Height: 28 cm Movement: Present     General:  Alert, oriented and cooperative. Patient is in no acute distress.  Skin: Skin is warm and dry. No rash noted.   Cardiovascular: Normal heart rate noted  Respiratory: Normal respiratory effort, no problems with respiration noted  Abdomen: Soft, gravid, appropriate for gestational age.  Pain/Pressure: Present     Pelvic: Cervical exam deferred        Extremities: Normal range of motion.  Edema: Trace  Mental Status: Normal mood and affect. Normal behavior. Normal judgment and thought content.   Assessment and Plan:  Pregnancy: [id number] at [redacted]w[redacted]d  1. Supervision of other normal pregnancy, antepartum Third trimester  labs today - Glucose Tolerance, 2 Hours w/1 Hour - CBC - RPR - HIV Antibody (routine testing w rflx) - Tdap vaccine greater than or equal to 7yo IM  Preterm labor symptoms and general obstetric precautions including but not limited to vaginal bleeding, contractions, leaking of fluid and fetal movement were reviewed in detail with the patient. Please refer to After Visit Summary for other counseling recommendations.  Return in about 2 weeks (around 09/27/2018) for OB Visit.  Future Appointments  Date Time Provider [place]  09/28/2018 10:00 AM WH-MFC [place] 3 WH-MFCUS MFC-US    Verita Schneiders, MD

## 2018-09-13 NOTE — Progress Notes (Signed)
Pt is here for ROB/ 2 hour GTT. [redacted]w[redacted]d. TDAP vaccine today.

## 2018-09-13 NOTE — Addendum Note (Signed)
Addended by: Verita Schneiders A on: 09/13/2018 10:40 AM   Modules accepted: Orders

## 2018-09-14 LAB — CBC
HEMATOCRIT: 29.2 % — AB (ref 34.0–46.6)
HEMOGLOBIN: 9.9 g/dL — AB (ref 11.1–15.9)
MCH: 31.6 pg (ref 26.6–33.0)
MCHC: 33.9 g/dL (ref 31.5–35.7)
MCV: 93 fL (ref 79–97)
Platelets: 186 10*3/uL (ref 150–450)
RBC: 3.13 x10E6/uL — AB (ref 3.77–5.28)
RDW: 13.4 % (ref 12.3–15.4)
WBC: 8.6 10*3/uL (ref 3.4–10.8)

## 2018-09-14 LAB — GLUCOSE TOLERANCE, 2 HOURS W/ 1HR
GLUCOSE, 2 HOUR: 67 mg/dL (ref 65–152)
Glucose, 1 hour: 75 mg/dL (ref 65–179)
Glucose, Fasting: 73 mg/dL (ref 65–91)

## 2018-09-14 LAB — HIV ANTIBODY (ROUTINE TESTING W REFLEX): HIV SCREEN 4TH GENERATION: NONREACTIVE

## 2018-09-14 LAB — RPR: RPR Ser Ql: NONREACTIVE

## 2018-09-23 ENCOUNTER — Inpatient Hospital Stay (HOSPITAL_COMMUNITY): Payer: Medicaid Other | Admitting: Anesthesiology

## 2018-09-23 ENCOUNTER — Encounter (HOSPITAL_COMMUNITY)
Admission: AD | Disposition: A | Discharge status: Home / Self Care | Payer: Self-pay | Source: Home / Self Care | Attending: Obstetrics & Gynecology

## 2018-09-23 ENCOUNTER — Encounter (HOSPITAL_COMMUNITY): Payer: Self-pay | Admitting: *Deleted

## 2018-09-23 ENCOUNTER — Inpatient Hospital Stay (HOSPITAL_COMMUNITY): Payer: Medicaid Other

## 2018-09-23 ENCOUNTER — Inpatient Hospital Stay (HOSPITAL_COMMUNITY)
Admission: AD | Admit: 2018-09-23 | Discharge: 2018-09-26 | DRG: 787 | Disposition: A | Discharge status: Home / Self Care | Payer: Medicaid Other | Attending: Obstetrics & Gynecology | Admitting: Obstetrics & Gynecology

## 2018-09-23 ENCOUNTER — Other Ambulatory Visit: Payer: Self-pay

## 2018-09-23 ENCOUNTER — Inpatient Hospital Stay (HOSPITAL_BASED_OUTPATIENT_CLINIC_OR_DEPARTMENT_OTHER): Payer: Medicaid Other

## 2018-09-23 DIAGNOSIS — R109 Unspecified abdominal pain: Secondary | ICD-10-CM

## 2018-09-23 DIAGNOSIS — O9081 Anemia of the puerperium: Secondary | ICD-10-CM | POA: Diagnosis not present

## 2018-09-23 DIAGNOSIS — Z3A30 30 weeks gestation of pregnancy: Secondary | ICD-10-CM

## 2018-09-23 DIAGNOSIS — O99334 Smoking (tobacco) complicating childbirth: Secondary | ICD-10-CM | POA: Diagnosis present

## 2018-09-23 DIAGNOSIS — Z3480 Encounter for supervision of other normal pregnancy, unspecified trimester: Secondary | ICD-10-CM

## 2018-09-23 DIAGNOSIS — O09299 Supervision of pregnancy with other poor reproductive or obstetric history, unspecified trimester: Secondary | ICD-10-CM

## 2018-09-23 DIAGNOSIS — O34211 Maternal care for low transverse scar from previous cesarean delivery: Secondary | ICD-10-CM | POA: Diagnosis present

## 2018-09-23 DIAGNOSIS — O09213 Supervision of pregnancy with history of pre-term labor, third trimester: Secondary | ICD-10-CM

## 2018-09-23 DIAGNOSIS — Z98891 History of uterine scar from previous surgery: Secondary | ICD-10-CM

## 2018-09-23 DIAGNOSIS — N179 Acute kidney failure, unspecified: Secondary | ICD-10-CM

## 2018-09-23 DIAGNOSIS — O1414 Severe pre-eclampsia complicating childbirth: Secondary | ICD-10-CM | POA: Diagnosis present

## 2018-09-23 DIAGNOSIS — O4693 Antepartum hemorrhage, unspecified, third trimester: Secondary | ICD-10-CM

## 2018-09-23 DIAGNOSIS — O09293 Supervision of pregnancy with other poor reproductive or obstetric history, third trimester: Secondary | ICD-10-CM | POA: Diagnosis not present

## 2018-09-23 DIAGNOSIS — O4593 Premature separation of placenta, unspecified, third trimester: Secondary | ICD-10-CM

## 2018-09-23 DIAGNOSIS — O45023 Premature separation of placenta with disseminated intravascular coagulation, third trimester: Secondary | ICD-10-CM | POA: Diagnosis present

## 2018-09-23 DIAGNOSIS — D62 Acute posthemorrhagic anemia: Secondary | ICD-10-CM | POA: Diagnosis not present

## 2018-09-23 DIAGNOSIS — O26833 Pregnancy related renal disease, third trimester: Secondary | ICD-10-CM | POA: Diagnosis not present

## 2018-09-23 DIAGNOSIS — O9952 Diseases of the respiratory system complicating childbirth: Secondary | ICD-10-CM | POA: Diagnosis present

## 2018-09-23 DIAGNOSIS — J45909 Unspecified asthma, uncomplicated: Secondary | ICD-10-CM | POA: Diagnosis present

## 2018-09-23 DIAGNOSIS — F17210 Nicotine dependence, cigarettes, uncomplicated: Secondary | ICD-10-CM | POA: Diagnosis present

## 2018-09-23 DIAGNOSIS — D65 Disseminated intravascular coagulation [defibrination syndrome]: Secondary | ICD-10-CM

## 2018-09-23 DIAGNOSIS — F1911 Other psychoactive substance abuse, in remission: Secondary | ICD-10-CM

## 2018-09-23 LAB — COMPREHENSIVE METABOLIC PANEL
ALK PHOS: 107 U/L (ref 38–126)
ALT: 11 U/L (ref 0–44)
ANION GAP: 10 (ref 5–15)
AST: 30 U/L (ref 15–41)
Albumin: 2.6 g/dL — ABNORMAL LOW (ref 3.5–5.0)
BUN: 18 mg/dL (ref 6–20)
CALCIUM: 8.5 mg/dL — AB (ref 8.9–10.3)
CO2: 22 mmol/L (ref 22–32)
CREATININE: 1.82 mg/dL — AB (ref 0.44–1.00)
Chloride: 103 mmol/L (ref 98–111)
GFR calc Af Amer: 42 mL/min — ABNORMAL LOW (ref >60)
GFR, EST NON AFRICAN AMERICAN: 36 mL/min — AB (ref >60)
GLUCOSE: 64 mg/dL — AB (ref 70–99)
Potassium: 3.4 mmol/L — ABNORMAL LOW (ref 3.5–5.1)
Sodium: 135 mmol/L (ref 135–145)
TOTAL PROTEIN: 6 g/dL — AB (ref 6.5–8.1)
Total Bilirubin: 0.5 mg/dL (ref 0.3–1.2)

## 2018-09-23 LAB — CBC
HEMATOCRIT: 26.2 % — AB (ref 36.0–46.0)
HEMATOCRIT: 23.1 % — AB (ref 36.0–46.0)
HEMOGLOBIN: 8.3 g/dL — AB (ref 12.0–15.0)
Hemoglobin: 8.9 g/dL — ABNORMAL LOW (ref 12.0–15.0)
MCH: 31.9 pg (ref 26.0–34.0)
MCH: 32.4 pg (ref 26.0–34.0)
MCHC: 34 g/dL (ref 30.0–36.0)
MCHC: 35.9 g/dL (ref 30.0–36.0)
MCV: 93.9 fL (ref 78.0–100.0)
MCV: 90.2 fL (ref 78.0–100.0)
Platelets: 111 10*3/uL — ABNORMAL LOW (ref 150–400)
Platelets: 80 10*3/uL — ABNORMAL LOW (ref 150–400)
RBC: 2.79 MIL/uL — AB (ref 3.87–5.11)
RBC: 2.56 MIL/uL — ABNORMAL LOW (ref 3.87–5.11)
RDW: 13.3 % (ref 11.5–15.5)
RDW: 14.3 % (ref 11.5–15.5)
WBC: 10.8 10*3/uL — AB (ref 4.0–10.5)
WBC: 16.3 10*3/uL — ABNORMAL HIGH (ref 4.0–10.5)

## 2018-09-23 LAB — APTT: aPTT: 24 seconds (ref 24–36)

## 2018-09-23 LAB — PREPARE RBC (CROSSMATCH)

## 2018-09-23 LAB — FIBRINOGEN
Fibrinogen: <60 mg/dL — CL (ref 210–475)
Fibrinogen: 225 mg/dL (ref 210–475)

## 2018-09-23 LAB — PROTIME-INR
INR: 1.39
PROTHROMBIN TIME: 16.9 s — AB (ref 11.4–15.2)

## 2018-09-23 SURGERY — Surgical Case
Anesthesia: General | Site: Abdomen | Wound class: Clean Contaminated

## 2018-09-23 MED ORDER — TETANUS-DIPHTH-ACELL PERTUSSIS 5-2.5-18.5 LF-MCG/0.5 IM SUSP: 0.5000 mL | Freq: Once | INTRAMUSCULAR | Status: DC

## 2018-09-23 MED ORDER — CHEWING GUM (ORBIT) SUGAR FREE: 1.0000 | CHEWING_GUM | ORAL | Status: DC | PRN

## 2018-09-23 MED ORDER — BUPRENORPHINE HCL 2 MG SL SUBL
4.0000 mg | SUBLINGUAL_TABLET | Freq: Once | SUBLINGUAL | Status: AC
  Administered 2018-09-23: 4 mg via SUBLINGUAL
  Filled 2018-09-23: qty 2

## 2018-09-23 MED ORDER — SCOPOLAMINE 1 MG/3DAYS TD PT72
MEDICATED_PATCH | TRANSDERMAL | Status: AC
  Filled 2018-09-23: qty 1

## 2018-09-23 MED ORDER — TRANEXAMIC ACID 1000 MG/10ML IV SOLN
1000.0000 mg | Freq: Once | INTRAVENOUS | Status: AC
  Administered 2018-09-23: 1000 mg via INTRAVENOUS
  Filled 2018-09-23: qty 10

## 2018-09-23 MED ORDER — SERTRALINE HCL 100 MG PO TABS
100.0000 mg | ORAL_TABLET | Freq: Every day | ORAL | Status: DC
  Administered 2018-09-24: 100 mg via ORAL
  Filled 2018-09-23 (×4): qty 1

## 2018-09-23 MED ORDER — SIMETHICONE 80 MG PO CHEW: 80.0000 mg | CHEWABLE_TABLET | ORAL | Status: DC | PRN

## 2018-09-23 MED ORDER — ONDANSETRON HCL 4 MG/2ML IJ SOLN: 4.0000 mg | Freq: Once | INTRAMUSCULAR | Status: DC | PRN

## 2018-09-23 MED ORDER — KETAMINE HCL 10 MG/ML IJ SOLN
30.0000 mg | Freq: Once | INTRAMUSCULAR | Status: AC
  Administered 2018-09-23: 30 mg via INTRAVENOUS
  Filled 2018-09-23: qty 3

## 2018-09-23 MED ORDER — MENTHOL 3 MG MT LOZG: 1.0000 | LOZENGE | OROMUCOSAL | Status: DC | PRN

## 2018-09-23 MED ORDER — OXYTOCIN 10 UNIT/ML IJ SOLN
INTRAMUSCULAR | Status: AC
  Filled 2018-09-23: qty 4

## 2018-09-23 MED ORDER — BUPRENORPHINE HCL 2 MG SL SUBL
4.0000 mg | SUBLINGUAL_TABLET | Freq: Every day | SUBLINGUAL | Status: DC
  Administered 2018-09-23 – 2018-09-25 (×3): 4 mg via SUBLINGUAL
  Filled 2018-09-23 (×3): qty 2

## 2018-09-23 MED ORDER — BETAMETHASONE SOD PHOS & ACET 6 (3-3) MG/ML IJ SUSP
12.0000 mg | Freq: Once | INTRAMUSCULAR | Status: AC
  Administered 2018-09-23: 12 mg via INTRAMUSCULAR
  Filled 2018-09-23: qty 2

## 2018-09-23 MED ORDER — ACETAMINOPHEN 10 MG/ML IV SOLN
1000.0000 mg | Freq: Once | INTRAVENOUS | Status: AC
  Administered 2018-09-23: 1000 mg via INTRAVENOUS

## 2018-09-23 MED ORDER — ROCURONIUM BROMIDE 100 MG/10ML IV SOLN
INTRAVENOUS | Status: DC | PRN
  Administered 2018-09-23: 10 mg via INTRAVENOUS
  Administered 2018-09-23: 50 mg via INTRAVENOUS

## 2018-09-23 MED ORDER — HYDROMORPHONE HCL 1 MG/ML IJ SOLN
INTRAMUSCULAR | Status: DC | PRN
  Administered 2018-09-23: 0.5 mg via INTRAVENOUS

## 2018-09-23 MED ORDER — SUCCINYLCHOLINE CHLORIDE 20 MG/ML IJ SOLN
INTRAMUSCULAR | Status: DC | PRN
  Administered 2018-09-23: 120 mg via INTRAVENOUS

## 2018-09-23 MED ORDER — FENTANYL CITRATE (PF) 250 MCG/5ML IJ SOLN
INTRAMUSCULAR | Status: AC
  Filled 2018-09-23: qty 5

## 2018-09-23 MED ORDER — DEXAMETHASONE SODIUM PHOSPHATE 10 MG/ML IJ SOLN
INTRAMUSCULAR | Status: DC | PRN
  Administered 2018-09-23: 10 mg via INTRAVENOUS

## 2018-09-23 MED ORDER — LABETALOL HCL 5 MG/ML IV SOLN
INTRAVENOUS | Status: DC | PRN
  Administered 2018-09-23: 10 mg via INTRAVENOUS

## 2018-09-23 MED ORDER — HYDROMORPHONE HCL 1 MG/ML IJ SOLN
INTRAMUSCULAR | Status: AC
  Filled 2018-09-23: qty 1

## 2018-09-23 MED ORDER — PRENATAL MULTIVITAMIN CH
1.0000 | ORAL_TABLET | Freq: Every day | ORAL | Status: DC
  Administered 2018-09-24 – 2018-09-26 (×3): 1 via ORAL
  Filled 2018-09-23 (×3): qty 1

## 2018-09-23 MED ORDER — PROPOFOL 10 MG/ML IV BOLUS
INTRAVENOUS | Status: DC | PRN
  Administered 2018-09-23: 200 mg via INTRAVENOUS

## 2018-09-23 MED ORDER — DEXAMETHASONE SODIUM PHOSPHATE 10 MG/ML IJ SOLN
INTRAMUSCULAR | Status: AC
  Filled 2018-09-23: qty 1

## 2018-09-23 MED ORDER — WITCH HAZEL-GLYCERIN EX PADS: 1.0000 "application " | MEDICATED_PAD | CUTANEOUS | Status: DC | PRN

## 2018-09-23 MED ORDER — SENNOSIDES-DOCUSATE SODIUM 8.6-50 MG PO TABS
2.0000 | ORAL_TABLET | ORAL | Status: DC
  Administered 2018-09-24 – 2018-09-26 (×3): 2 via ORAL
  Filled 2018-09-23 (×3): qty 2

## 2018-09-23 MED ORDER — CEFAZOLIN SODIUM-DEXTROSE 2-4 GM/100ML-% IV SOLN
INTRAVENOUS | Status: AC
  Filled 2018-09-23: qty 100

## 2018-09-23 MED ORDER — SIMETHICONE 80 MG PO CHEW
80.0000 mg | CHEWABLE_TABLET | ORAL | Status: DC
  Administered 2018-09-24 – 2018-09-26 (×3): 80 mg via ORAL
  Filled 2018-09-23 (×3): qty 1

## 2018-09-23 MED ORDER — PRENATAL GUMMIES/DHA & FA 0.4-32.5 MG PO CHEW: 2.0000 | CHEWABLE_TABLET | Freq: Every day | ORAL | Status: DC

## 2018-09-23 MED ORDER — SODIUM CHLORIDE 0.9 % IV SOLN
INTRAVENOUS | Status: DC | PRN
  Administered 2018-09-23: 14:00:00 via INTRAVENOUS

## 2018-09-23 MED ORDER — LABETALOL HCL 5 MG/ML IV SOLN
INTRAVENOUS | Status: AC
  Filled 2018-09-23: qty 4

## 2018-09-23 MED ORDER — OXYCODONE HCL 5 MG PO TABS: 5.0000 mg | ORAL_TABLET | Freq: Once | ORAL | Status: DC | PRN

## 2018-09-23 MED ORDER — LABETALOL HCL 5 MG/ML IV SOLN: 80.0000 mg | INTRAVENOUS | Status: DC | PRN

## 2018-09-23 MED ORDER — LACTATED RINGERS IV SOLN
INTRAVENOUS | Status: DC
  Administered 2018-09-24: 07:00:00 via INTRAVENOUS

## 2018-09-23 MED ORDER — SUGAMMADEX SODIUM 200 MG/2ML IV SOLN
INTRAVENOUS | Status: DC | PRN
  Administered 2018-09-23: 350 mg via INTRAVENOUS

## 2018-09-23 MED ORDER — ZOLPIDEM TARTRATE 5 MG PO TABS: 5.0000 mg | ORAL_TABLET | Freq: Every evening | ORAL | Status: DC | PRN

## 2018-09-23 MED ORDER — DIBUCAINE 1 % RE OINT: 1.0000 "application " | TOPICAL_OINTMENT | RECTAL | Status: DC | PRN

## 2018-09-23 MED ORDER — ALBUTEROL SULFATE (2.5 MG/3ML) 0.083% IN NEBU
3.0000 mL | INHALATION_SOLUTION | Freq: Four times a day (QID) | RESPIRATORY_TRACT | Status: DC
  Administered 2018-09-24 – 2018-09-26 (×9): 3 mL via RESPIRATORY_TRACT
  Filled 2018-09-23 (×11): qty 3

## 2018-09-23 MED ORDER — LABETALOL HCL 5 MG/ML IV SOLN: 40.0000 mg | INTRAVENOUS | Status: DC | PRN

## 2018-09-23 MED ORDER — FENTANYL CITRATE (PF) 100 MCG/2ML IJ SOLN
INTRAMUSCULAR | Status: AC
  Filled 2018-09-23: qty 2

## 2018-09-23 MED ORDER — FENTANYL CITRATE (PF) 250 MCG/5ML IJ SOLN
INTRAMUSCULAR | Status: DC | PRN
  Administered 2018-09-23 (×2): 250 ug via INTRAVENOUS

## 2018-09-23 MED ORDER — LABETALOL HCL 5 MG/ML IV SOLN
10.0000 mg | Freq: Once | INTRAVENOUS | Status: AC
  Administered 2018-09-23: 10 mg via INTRAVENOUS

## 2018-09-23 MED ORDER — BUPRENORPHINE HCL 8 MG SL SUBL
8.0000 mg | SUBLINGUAL_TABLET | Freq: Every day | SUBLINGUAL | Status: DC
  Administered 2018-09-24 – 2018-09-26 (×3): 8 mg via SUBLINGUAL
  Filled 2018-09-23 (×3): qty 1

## 2018-09-23 MED ORDER — LACTATED RINGERS IV BOLUS
1000.0000 mL | Freq: Once | INTRAVENOUS | Status: AC
  Administered 2018-09-23: 1000 mL via INTRAVENOUS

## 2018-09-23 MED ORDER — SUGAMMADEX SODIUM 500 MG/5ML IV SOLN
INTRAVENOUS | Status: AC
  Filled 2018-09-23: qty 5

## 2018-09-23 MED ORDER — BUDESONIDE 0.25 MG/2ML IN SUSP
0.2500 mg | Freq: Two times a day (BID) | RESPIRATORY_TRACT | Status: DC
  Administered 2018-09-24 – 2018-09-26 (×5): 0.25 mg via RESPIRATORY_TRACT
  Filled 2018-09-23 (×8): qty 2

## 2018-09-23 MED ORDER — SODIUM CHLORIDE 0.9 % IV SOLN
INTRAVENOUS | Status: DC | PRN
  Administered 2018-09-23: 13:00:00 via INTRAVENOUS

## 2018-09-23 MED ORDER — DEXMEDETOMIDINE HCL IN NACL 200 MCG/50ML IV SOLN
INTRAVENOUS | Status: AC
  Filled 2018-09-23: qty 50

## 2018-09-23 MED ORDER — MIDAZOLAM HCL 2 MG/2ML IJ SOLN
INTRAMUSCULAR | Status: AC
  Filled 2018-09-23: qty 2

## 2018-09-23 MED ORDER — FENTANYL CITRATE (PF) 100 MCG/2ML IJ SOLN
25.0000 ug | INTRAMUSCULAR | Status: DC | PRN
  Administered 2018-09-23: 50 ug via INTRAVENOUS

## 2018-09-23 MED ORDER — COCONUT OIL OIL: 1.0000 "application " | TOPICAL_OIL | Status: DC | PRN

## 2018-09-23 MED ORDER — DIPHENHYDRAMINE HCL 25 MG PO CAPS: 25.0000 mg | ORAL_CAPSULE | Freq: Four times a day (QID) | ORAL | Status: DC | PRN

## 2018-09-23 MED ORDER — OXYCODONE-ACETAMINOPHEN 5-325 MG PO TABS
1.0000 | ORAL_TABLET | ORAL | Status: DC | PRN
  Administered 2018-09-23 – 2018-09-26 (×2): 1 via ORAL
  Filled 2018-09-23 (×4): qty 1

## 2018-09-23 MED ORDER — HYDROMORPHONE HCL 1 MG/ML IJ SOLN
1.0000 mg | Freq: Once | INTRAMUSCULAR | Status: AC
  Administered 2018-09-23 – 2018-09-24 (×2): 1 mg via INTRAVENOUS

## 2018-09-23 MED ORDER — LABETALOL HCL 5 MG/ML IV SOLN: 20.0000 mg | INTRAVENOUS | Status: DC | PRN

## 2018-09-23 MED ORDER — SCOPOLAMINE 1 MG/3DAYS TD PT72
MEDICATED_PATCH | TRANSDERMAL | Status: DC | PRN
  Administered 2018-09-23: 1 via TRANSDERMAL

## 2018-09-23 MED ORDER — KETAMINE HCL 10 MG/ML IJ SOLN
INTRAMUSCULAR | Status: AC
  Filled 2018-09-23: qty 1

## 2018-09-23 MED ORDER — TRANEXAMIC ACID 1000 MG/10ML IV SOLN
1000.0000 mg | INTRAVENOUS | Status: AC
  Administered 2018-09-23: 1000 mg via INTRAVENOUS
  Filled 2018-09-23: qty 10

## 2018-09-23 MED ORDER — ONDANSETRON HCL 4 MG/2ML IJ SOLN
INTRAMUSCULAR | Status: DC | PRN
  Administered 2018-09-23: 4 mg via INTRAVENOUS

## 2018-09-23 MED ORDER — ACETAMINOPHEN 10 MG/ML IV SOLN
INTRAVENOUS | Status: AC
  Filled 2018-09-23: qty 100

## 2018-09-23 MED ORDER — LACTATED RINGERS IV SOLN
INTRAVENOUS | Status: DC | PRN
  Administered 2018-09-23 (×2): via INTRAVENOUS

## 2018-09-23 MED ORDER — MAGNESIUM SULFATE 40 G IN LACTATED RINGERS - SIMPLE
INTRAVENOUS | Status: AC
  Administered 2018-09-23: 4 g via INTRAVENOUS
  Filled 2018-09-23: qty 500

## 2018-09-23 MED ORDER — ACETAMINOPHEN 325 MG PO TABS: 650.0000 mg | ORAL_TABLET | ORAL | Status: DC | PRN

## 2018-09-23 MED ORDER — MAGNESIUM SULFATE BOLUS VIA INFUSION
4.0000 g | Freq: Once | INTRAVENOUS | Status: AC
  Administered 2018-09-23: 4 g via INTRAVENOUS
  Filled 2018-09-23: qty 500

## 2018-09-23 MED ORDER — MIDAZOLAM HCL 2 MG/2ML IJ SOLN
INTRAMUSCULAR | Status: DC | PRN
  Administered 2018-09-23: 2 mg via INTRAVENOUS

## 2018-09-23 MED ORDER — ROCURONIUM BROMIDE 100 MG/10ML IV SOLN
INTRAVENOUS | Status: AC
  Filled 2018-09-23: qty 1

## 2018-09-23 MED ORDER — ONDANSETRON HCL 4 MG/2ML IJ SOLN
INTRAMUSCULAR | Status: AC
  Filled 2018-09-23: qty 2

## 2018-09-23 MED ORDER — LACTATED RINGERS IV SOLN
INTRAVENOUS | Status: DC | PRN
  Administered 2018-09-23: 13:00:00 via INTRAVENOUS

## 2018-09-23 MED ORDER — IBUPROFEN 600 MG PO TABS
600.0000 mg | ORAL_TABLET | Freq: Four times a day (QID) | ORAL | Status: DC
  Filled 2018-09-23 (×3): qty 1

## 2018-09-23 MED ORDER — OXYTOCIN 10 UNIT/ML IJ SOLN
INTRAVENOUS | Status: DC | PRN
  Administered 2018-09-23: 40 [IU] via INTRAVENOUS

## 2018-09-23 MED ORDER — OXYTOCIN 40 UNITS IN LACTATED RINGERS INFUSION - SIMPLE MED: 2.5000 [IU]/h | INTRAVENOUS | Status: AC

## 2018-09-23 MED ORDER — CEFAZOLIN SODIUM-DEXTROSE 2-3 GM-%(50ML) IV SOLR
INTRAVENOUS | Status: DC | PRN
  Administered 2018-09-23: 2 g via INTRAVENOUS

## 2018-09-23 MED ORDER — OXYCODONE HCL 5 MG/5ML PO SOLN: 5.0000 mg | Freq: Once | ORAL | Status: DC | PRN

## 2018-09-23 MED ORDER — HYDRALAZINE HCL 20 MG/ML IJ SOLN: 10.0000 mg | INTRAMUSCULAR | Status: DC | PRN

## 2018-09-23 MED ORDER — SIMETHICONE 80 MG PO CHEW
80.0000 mg | CHEWABLE_TABLET | Freq: Three times a day (TID) | ORAL | Status: DC
  Administered 2018-09-24 – 2018-09-26 (×7): 80 mg via ORAL
  Filled 2018-09-23 (×7): qty 1

## 2018-09-23 MED ORDER — SODIUM CHLORIDE 0.9% IV SOLUTION: Freq: Once | INTRAVENOUS | Status: DC

## 2018-09-23 MED ORDER — OXYCODONE-ACETAMINOPHEN 5-325 MG PO TABS
2.0000 | ORAL_TABLET | ORAL | Status: DC | PRN
  Administered 2018-09-24 – 2018-09-26 (×12): 2 via ORAL
  Filled 2018-09-23 (×12): qty 2

## 2018-09-23 MED ORDER — PANTOPRAZOLE SODIUM 40 MG PO TBEC
40.0000 mg | DELAYED_RELEASE_TABLET | Freq: Every day | ORAL | Status: DC
  Administered 2018-09-24 – 2018-09-26 (×3): 40 mg via ORAL
  Filled 2018-09-23 (×3): qty 1

## 2018-09-23 MED ORDER — MAGNESIUM SULFATE 40 G IN LACTATED RINGERS - SIMPLE: 2.0000 g/h | INTRAVENOUS | Status: DC

## 2018-09-23 SURGICAL SUPPLY — 34 items
BENZOIN TINCTURE PRP APPL 2/3 (GAUZE/BANDAGES/DRESSINGS) ×3 IMPLANT
CHLORAPREP W/TINT 26ML (MISCELLANEOUS) ×3 IMPLANT
CLAMP CORD UMBIL (MISCELLANEOUS) IMPLANT
CLOSURE STERI-STRIP 1/4X4 (GAUZE/BANDAGES/DRESSINGS) ×3 IMPLANT
CLOSURE WOUND 1/2 X4 (GAUZE/BANDAGES/DRESSINGS) ×1
CLOTH BEACON ORANGE TIMEOUT ST (SAFETY) ×3 IMPLANT
DRSG OPSITE POSTOP 4X10 (GAUZE/BANDAGES/DRESSINGS) ×3 IMPLANT
ELECT REM PT RETURN 9FT ADLT (ELECTROSURGICAL) ×3
ELECTRODE REM PT RTRN 9FT ADLT (ELECTROSURGICAL) ×1 IMPLANT
EXTRACTOR VACUUM M CUP 4 TUBE (SUCTIONS) IMPLANT
EXTRACTOR VACUUM M CUP 4' TUBE (SUCTIONS)
GLOVE BIOGEL PI IND STRL 7.0 (GLOVE) ×2 IMPLANT
GLOVE BIOGEL PI IND STRL 7.5 (GLOVE) ×2 IMPLANT
GLOVE BIOGEL PI INDICATOR 7.0 (GLOVE) ×4
GLOVE BIOGEL PI INDICATOR 7.5 (GLOVE) ×4
GLOVE ECLIPSE 7.5 STRL STRAW (GLOVE) ×3 IMPLANT
GOWN STRL REUS W/TWL LRG LVL3 (GOWN DISPOSABLE) ×9 IMPLANT
HEMOSTAT ARISTA ABSORB 3G PWDR (MISCELLANEOUS) ×3 IMPLANT
KIT ABG SYR 3ML LUER SLIP (SYRINGE) IMPLANT
NEEDLE HYPO 25X5/8 SAFETYGLIDE (NEEDLE) IMPLANT
NS IRRIG 1000ML POUR BTL (IV SOLUTION) ×3 IMPLANT
PACK C SECTION WH (CUSTOM PROCEDURE TRAY) ×3 IMPLANT
PAD OB MATERNITY 4.3X12.25 (PERSONAL CARE ITEMS) ×3 IMPLANT
PENCIL SMOKE EVAC W/HOLSTER (ELECTROSURGICAL) ×3 IMPLANT
RTRCTR C-SECT PINK 25CM LRG (MISCELLANEOUS) ×3 IMPLANT
SPONGE LAP 18X18 RF (DISPOSABLE) ×9 IMPLANT
STRIP CLOSURE SKIN 1/2X4 (GAUZE/BANDAGES/DRESSINGS) ×2 IMPLANT
SUT VIC AB 0 CTX 36 (SUTURE) ×6
SUT VIC AB 0 CTX36XBRD ANBCTRL (SUTURE) ×3 IMPLANT
SUT VIC AB 2-0 CT1 27 (SUTURE) ×2
SUT VIC AB 2-0 CT1 TAPERPNT 27 (SUTURE) ×1 IMPLANT
SUT VIC AB 4-0 KS 27 (SUTURE) ×3 IMPLANT
TOWEL OR 17X24 6PK STRL BLUE (TOWEL DISPOSABLE) ×3 IMPLANT
TRAY FOLEY W/BAG SLVR 14FR LF (SET/KITS/TRAYS/PACK) ×3 IMPLANT

## 2018-09-23 NOTE — MAU Provider Note (Signed)
Terri Estes is a 32 y.o. female [phone] @ 30.2 wks in with sudden onset of severe abd pain that started at apx 12:00 and followed by profuse vag bleeding. Pt has hx of preeclampsia with prev pregnancy. OB History    Gravida  6   Para  2   Term  1   Preterm  1   AB  3   Living  2     SAB      TAB  3   Ectopic      Multiple      Live Births  2          Past Medical History:  Diagnosis Date  . Asthma   . Chronic pain   . Pregnancy induced hypertension   . Trichomonas infection   . Vaginal Pap smear, abnormal    Past Surgical History:  Procedure Laterality Date  . CESAREAN SECTION  05/26/2008  . CESAREAN SECTION N/A 07/10/2017   Procedure: CESAREAN SECTION;  Surgeon: Florian Buff, MD;  Location: [facility];  Service: Obstetrics;  Laterality: N/A;  . COLPOSCOPY    . WISDOM TOOTH EXTRACTION     Family History: family history includes Diabetes in her paternal grandmother; Kidney disease in her maternal grandfather, maternal grandmother, paternal grandfather, and paternal grandmother. Social History:  reports that she has been smoking cigarettes. She has a 1.25 pack-year smoking history. She has never used smokeless tobacco. She reports that she has current or past drug history. She reports that she does not drink alcohol.     Maternal Diabetes: No Genetic Screening: Normal Maternal Ultrasounds/Referrals: Normal Fetal Ultrasounds or other Referrals:  None Maternal Substance Abuse:  Yes:  Type: Other: subutex Significant Maternal Medications:  Meds include: Other: subutex Significant Maternal Lab Results:  None Other Comments:  None  ROS Maternal Medical History:  Reason for admission: Vaginal bleeding.  abd pain  Contractions: Onset was less than 1 hour ago.    Fetal activity: Perceived fetal activity is normal.   Last perceived fetal movement was within the past hour.    Prenatal complications: Substance abuse.   Prenatal Complications -  Diabetes: none.    Dilation: Closed Exam by:: lawson cnm Blood pressure (!) 135/95, pulse 99, resp. rate 18, last menstrual period 02/23/2018, SpO2 100 %, unknown if currently breastfeeding. Maternal Exam:  Uterine Assessment: Tense to palpate  Abdomen: Patient reports generalized tenderness.  Surgical scars: low transverse.   Fetal presentation: vertex  Introitus: Normal vulva. Normal vagina.  Ferning test: not done.  Nitrazine test: not done.  Cervix: Cervix evaluated by digital exam.     Physical Exam  Constitutional: She is oriented to person, place, and time. She appears well-developed and well-nourished.  HENT:  Head: Normocephalic.  Neck: Normal range of motion.  Cardiovascular: Normal rate, regular rhythm, normal heart sounds and intact distal pulses.  Respiratory: Effort normal and breath sounds normal.  GI: Soft. Bowel sounds are normal. There is generalized tenderness.  Genitourinary: Vagina normal and uterus normal.  Musculoskeletal: Normal range of motion.  Neurological: She is alert and oriented to person, place, and time. She has normal reflexes.  Skin: Skin is warm and dry.  Psychiatric: She has a normal mood and affect. Her behavior is normal. Judgment and thought content normal.    Prenatal labs: ABO, Rh: A/Positive/-- (05/15 1535) Antibody: Negative (05/15 1535) Rubella: 3.85 (05/15 1535) RPR: Non Reactive (09/18 1133)  HBsAg: Negative (05/15 1535)  HIV: Non Reactive (09/18 1133)  GBS:     Assessment/Plan: preg at 30.2 wks abruption Labs IV SVE cl/th/post/high.smamt bright vag bleeding abd tense and tender to palpate OB notified   Koren Shiver 09/23/2018, 12:45 PM

## 2018-09-23 NOTE — H&P (Addendum)
Terri Estes is an 32 y.o. [phone] [id number].   Chief Complaint: abdominal pain HPI: Patient arrived via EMS at 30 wks, prior C-section x 2, acute onset vaginal bleeding and abdominal pain and c/w abruption. FHR was non-reassuring. Taken for emergent cesarean delivery.  Past Medical History:  Diagnosis Date  . Asthma   . Chronic pain   . Pregnancy induced hypertension   . Trichomonas infection   . Vaginal Pap smear, abnormal     Past Surgical History:  Procedure Laterality Date  . CESAREAN SECTION  05/26/2008  . CESAREAN SECTION N/A 07/10/2017   Procedure: CESAREAN SECTION;  Surgeon: EFlorian Buff MD;  Location: [facility]  Service: Obstetrics;  Laterality: N/A;  . COLPOSCOPY    . WISDOM TOOTH EXTRACTION      Family History  Problem Relation Age of Onset  . Kidney disease Maternal Grandmother   . Kidney disease Maternal Grandfather   . Kidney disease Paternal Grandmother   . Diabetes Paternal Grandmother   . Kidney disease Paternal Grandfather    Social History:  reports that she has been smoking cigarettes. She has a 1.25 pack-year smoking history. She has never used smokeless tobacco. She reports that she has current or past drug history. She reports that she does not drink alcohol. This has not been independently verified.   No Known Allergies  Medications Prior to Admission  Medication Sig Dispense Refill  . albuterol (PROVENTIL HFA;VENTOLIN HFA) 108 (90 Base) MCG/ACT inhaler Inhale 2 puffs into the lungs 4 (four) times daily.    .Marland Kitchenaspirin EC 81 MG tablet Take 1 tablet (81 mg total) by mouth daily. Take after 12 weeks for prevention of preeclampsia later in pregnancy 300 tablet 2  . Beclomethasone Dipropionate (QVAR IN) Inhale into the lungs.    . buprenorphine (SUBUTEX) 2 MG SUBL SL tablet Place 4 mg under the tongue at bedtime.    . buprenorphine (SUBUTEX) 8 MG SUBL SL tablet Place 8 mg under the tongue daily.     . cyclobenzaprine (FLEXERIL) 10 MG  tablet Take 1 tablet (10 mg total) by mouth 3 (three) times daily as needed for muscle spasms. 90 tablet 2  . Doxylamine-Pyridoxine (DICLEGIS) 10-10 MG TBEC Take 2 tablets by mouth at bedtime. If symptoms persist, add one tablet in the morning and one in the afternoon 100 tablet 5  . pantoprazole (PROTONIX) 40 MG tablet Take 1 tablet (40 mg total) by mouth daily. 30 tablet 6  . polyethylene glycol (MIRALAX / GLYCOLAX) packet Take 17 g by mouth daily as needed for mild constipation.     . Prenatal MV-Min-FA-Omega-3 (PRENATAL GUMMIES/DHA & FA) 0.4-32.5 MG CHEW Chew 2 each by mouth daily.    . sertraline (ZOLOFT) 100 MG tablet TK 1 T PO QD  0     A comprehensive review of systems was negative.  Blood pressure (!) 144/90, pulse 78, resp. rate 18, last menstrual period 02/23/2018, SpO2 100 %, unknown if currently breastfeeding. BP (!) 144/90   Pulse 78   Resp 18   LMP 02/23/2018   SpO2 100%  General appearance: alert, cooperative and appears stated age Head: Normocephalic, without obvious abnormality, atraumatic Neck: supple, symmetrical, trachea midline Lungs: normal effort Heart: regular rate and rhythm Abdomen: gravid, firm Extremities: Homans sign is negative, no sign of DVT Skin: Skin color, texture, turgor normal. No rashes or lesions Neurologic: Grossly normal Genitourinary: vaginal bleeding noted  Lab Results  Component Value Date   WBC 10.8 (H)  09/23/2018   HGB 8.9 (L) 09/23/2018   HCT 26.2 (L) 09/23/2018   MCV 93.9 09/23/2018   PLT 111 (L) 09/23/2018   Lab Results  Component Value Date   FIBRINOGEN <60 (LL) 09/23/2018   CMP Latest Ref Rng & Units 09/23/2018 05/10/2018 07/11/2017  Glucose 70 - 99 mg/dL 64(L) 80 88  BUN 6 - 20 mg/dL 18 7 <5(L)  Creatinine 0.44 - 1.00 mg/dL 1.82(H) 0.76 0.72  Sodium 135 - 145 mmol/L 135 137 136  Potassium 3.5 - 5.1 mmol/L 3.4(L) 3.7 3.5  Chloride 98 - 111 mmol/L 103 98 103  CO2 22 - 32 mmol/L '22 20 29  ' Calcium 8.9 - 10.3 mg/dL 8.5(L)  9.7 7.0(L)  Total Protein 6.5 - 8.1 g/dL 6.0(L) 7.2 4.8(L)  Total Bilirubin 0.3 - 1.2 mg/dL 0.5 0.3 0.7  Alkaline Phos 38 - 126 U/L 107 62 137(H)  AST 15 - 41 U/L '30 12 20  ' ALT 0 - 44 U/L 11 20 10(L)          ABO, Rh: --/--/A POS (09/28 1247)  Antibody: NEG (09/28 1247)  Rubella: 3.85 (05/15 1535)  RPR: Non Reactive (09/18 1133)  HBsAg: Negative (05/15 1535)  HIV: Non Reactive (09/18 1133)  GBS:       Assessment/Plan Principal Problem:   Abruptio placentae syndrome, third trimester Active Problems:   History of cesarean delivery   Hx of preeclampsia, prior pregnancy, currently pregnant   History of substance abuse (HCC)   Acute blood loss anemia   DIC (disseminated intravascular coagulation) ([place])   S/P cesarean section   Acute kidney injury ([place])  Late entry note--I met patient in the OR, she had flat tracing and needed emergent delivery. See op note for further details.  Donnamae Jude 09/23/2018, 2:54 PM

## 2018-09-23 NOTE — Anesthesia Procedure Notes (Signed)
Procedure Name: Intubation Date/Time: 09/23/2018 1:00 PM Performed by: Genevie Ann, CRNA Pre-anesthesia Checklist: Patient identified, Timeout performed, Emergency Drugs available, Suction available and Patient being monitored Patient Re-evaluated:Patient Re-evaluated prior to induction Oxygen Delivery Method: Circle system utilized Preoxygenation: Pre-oxygenation with 100% oxygen Induction Type: IV induction Laryngoscope Size: Glidescope and 3 Grade View: Grade I Tube type: Oral Number of attempts: 1 Dental Injury: Teeth and Oropharynx as per pre-operative assessment

## 2018-09-23 NOTE — Plan of Care (Signed)
  Problem: Pain Managment: Goal: General experience of comfort will improve Outcome: Progressing   Problem: Safety: Goal: Ability to remain free from injury will improve Outcome: Progressing   

## 2018-09-23 NOTE — Op Note (Signed)
Preoperative Diagnosis:  IUP @ [redacted]w[redacted]d, Abruption, Non-reassuring FHR, Previous C-section x 2, elevated Blood pressure  Postoperative Diagnosis:  Same + DIC, Acute blood loss anemia  Procedure: Emergent Repeat low transverse cesarean section  Surgeon: Darron Doom, M.D.  Assistant: Tania Ade, MD An experienced assistant was required given the standard of surgical care given the complexity of the case.  This assistant was needed for exposure, dissection, suctioning, retraction, instrument exchange, assisting with delivery with administration of fundal pressure, and for overall help during the procedure especially with hemostasis. Anesthesia: General - [place]  Findings: [place] uterus,  Viable female infant, APGAR (1 MIN): 1  APGAR (5 MINS): 6  APGAR (10 MINS): 6 weight  3 lb 4.6 oz, 1490 gms,  presentation, vertex, cord pH is 6.9  Estimated blood loss: 6283 cc  Complications: None known  Specimens: Placenta to pathology  Reason for procedure: Briefly, the patient is a 32 y.o. [id number] [redacted]w[redacted]d who presents for acute onset abdominal pain and vaginal bleeding. Arrived via EMS and had h/o prior C-section x 2. FHR was not reassuring, and moved to urgent C-section.  Procedure: Patient is taken to the OR at 1258 pm where she was then placed in a supine position. She received 2 g of Ancef.  She was prepped and draped quickly. A Foley catheter was present in the bladder. A knife was then used to make a Pfannenstiel incision at 1304 pm. This incision was carried out to underlying fascia which was divided in the midline with the knife. The incision was extended laterally, sharply.  The rectus was divided in the midline.  The peritoneal cavity was entered bluntly.  A knife was used to make a low transverse incision on the uterus. This incision was carried down to the amniotic cavity was entered. Fetus was vertex and was brought up out of the incision without difficulty at 1305pm. Cord was clamped x 2 and  cut. Infant given to waiting nurse. Cord blood was obtained. Placenta was delivered from the uterus.  Uterus was cleaned with dry lap pads. Uterine incision closed with 0 Vicryl suture in a locked running fashion. A second layer of 0 Vicryl in an imbricating fashion was used to achieve hemostasis. A third layer of Monocryl was used. The uterus was quite oozy and there was bleeding from the omentum, rectus, subcutaneous tissues. Pressure was held. Bleeding continued. O'Leary suture done on both sides. Several more figure of eights used on uterus, rectus, omentum. PRBC's, TXA, Fibrinogen given. Arista used at uterine incision. Bleeding was finally controlled and rectus closed with interrupted sutures.  Fascia is closed with 0 Vicryl suture in a running fashion. Subcutaneous closure was performed with 0 plain suture. Skin closed using 3-0 Vicryl on a Keith needle.  Steri strips applied, followed by pressure dressing.  All instrument, needle and lap counts were correct x 2. Patient was awake and taken to PACU stable.  Infant to NICU, stable.   Standley Dakins PrattMD 09/23/2018 5:08 PM

## 2018-09-23 NOTE — MAU Note (Signed)
Arrived via EMS  Reports large gush of bright red vaginal bleeding  Reports abdomen is sore  Decreased FM

## 2018-09-23 NOTE — Plan of Care (Signed)
  Problem: Pain Managment: Goal: General experience of comfort will improve Outcome: Progressing   Problem: Safety: Goal: Ability to remain free from injury will improve Outcome: Progressing   Problem: Education: Goal: Knowledge of condition will improve Outcome: Progressing

## 2018-09-23 NOTE — Anesthesia Postprocedure Evaluation (Signed)
Anesthesia Post Note  Patient: Terri Estes  Procedure(s) Performed: CESAREAN SECTION (N/A Abdomen)     Patient location during evaluation: PACU Anesthesia Type: General Level of consciousness: awake and alert Pain management: pain level controlled Vital Signs Assessment: post-procedure vital signs reviewed and stable Respiratory status: spontaneous breathing, nonlabored ventilation and respiratory function stable Cardiovascular status: blood pressure returned to baseline and stable Postop Assessment: no apparent nausea or vomiting Anesthetic complications: no    Last Vitals:  Vitals:   09/23/18 1756 09/23/18 1839  BP: (!) 126/98 (!) 113/92  Pulse: 82 85  Resp:    Temp:    SpO2: 100% 100%    Last Pain:  Vitals:   09/23/18 1842  TempSrc:   PainSc: Asleep   Pain Goal:                 Lidia Collum

## 2018-09-23 NOTE — Anesthesia Preprocedure Evaluation (Signed)
Anesthesia Evaluation  Patient identified by MRN, date of birth, ID band Patient awake  Preop documentation limited or incomplete due to emergent nature of procedure.  Airway Mallampati: II  TM Distance: >3 FB Neck ROM: Full    Dental   Pulmonary asthma , Current Smoker,    Pulmonary exam normal        Cardiovascular hypertension, Normal cardiovascular exam     Neuro/Psych negative neurological ROS  negative psych ROS   GI/Hepatic negative GI ROS, Neg liver ROS,   Endo/Other  negative endocrine ROS  Renal/GU negative Renal ROS  negative genitourinary   Musculoskeletal negative musculoskeletal ROS (+)   Abdominal (+) + obese,   Peds  Hematology negative hematology ROS (+)   Anesthesia Other Findings   Reproductive/Obstetrics (+) Pregnancy                             Anesthesia Physical Anesthesia Plan  ASA: III and emergent  Anesthesia Plan: General   Post-op Pain Management:    Induction: Intravenous, Rapid sequence and Cricoid pressure planned  PONV Risk Score and Plan: 3 and Ondansetron, Dexamethasone and Treatment may vary due to age or medical condition  Airway Management Planned: Oral ETT  Additional Equipment:   Intra-op Plan:   Post-operative Plan: Extubation in OR  Informed Consent: I have reviewed the patients History and Physical, chart, labs and discussed the procedure including the risks, benefits and alternatives for the proposed anesthesia with the patient or authorized representative who has indicated his/her understanding and acceptance.   Only emergency history available  Plan Discussed with:   Anesthesia Plan Comments:         Anesthesia Quick Evaluation

## 2018-09-23 NOTE — Transfer of Care (Signed)
Immediate Anesthesia Transfer of Care Note  Patient: Terri Estes  Procedure(s) Performed: CESAREAN SECTION (N/A Abdomen)  Patient Location: PACU  Anesthesia Type:General  Level of Consciousness: awake, alert  and oriented  Airway & Oxygen Therapy: Patient Spontanous Breathing and Patient connected to nasal cannula oxygen  Post-op Assessment: Report given to RN and Post -op Vital signs reviewed and stable  Post vital signs: Reviewed and stable  Last Vitals:  Vitals Value Taken Time  BP 138/102 09/23/2018  3:17 PM  Temp    Pulse 81 09/23/2018  3:19 PM  Resp    SpO2 100 % 09/23/2018  3:19 PM  Vitals shown include unvalidated device data.  Last Pain:  Vitals:   09/23/18 1237  TempSrc:   PainSc: 10-Worst pain ever         Complications: No apparent anesthesia complications

## 2018-09-24 LAB — CBC
HCT: 21.3 % — ABNORMAL LOW (ref 36.0–46.0)
Hemoglobin: 7.5 g/dL — ABNORMAL LOW (ref 12.0–15.0)
MCH: 32.1 pg (ref 26.0–34.0)
MCHC: 35.2 g/dL (ref 30.0–36.0)
MCV: 91 fL (ref 78.0–100.0)
PLATELETS: 73 10*3/uL — AB (ref 150–400)
RBC: 2.34 MIL/uL — AB (ref 3.87–5.11)
RDW: 14.6 % (ref 11.5–15.5)
WBC: 18.6 10*3/uL — AB (ref 4.0–10.5)

## 2018-09-24 LAB — PREPARE FRESH FROZEN PLASMA
Unit division: 0
Unit division: 0

## 2018-09-24 LAB — PREPARE RBC (CROSSMATCH)

## 2018-09-24 LAB — BPAM FFP
BLOOD PRODUCT EXPIRATION DATE: 201910032359
BLOOD PRODUCT EXPIRATION DATE: 201910032359
ISSUE DATE / TIME: 201909281357
ISSUE DATE / TIME: 201909281417
Unit Type and Rh: 600
Unit Type and Rh: 6200

## 2018-09-24 LAB — RPR: RPR Ser Ql: NONREACTIVE

## 2018-09-24 MED ORDER — SODIUM CHLORIDE 0.9% IV SOLUTION
Freq: Once | INTRAVENOUS | Status: AC
  Administered 2018-09-24: 10:00:00 via INTRAVENOUS

## 2018-09-24 MED ORDER — ENOXAPARIN SODIUM 40 MG/0.4ML ~~LOC~~ SOLN
40.0000 mg | SUBCUTANEOUS | Status: DC
  Administered 2018-09-24: 40 mg via SUBCUTANEOUS
  Filled 2018-09-24 (×2): qty 0.4

## 2018-09-24 MED ORDER — PHENOL 1.4 % MT LIQD
1.0000 | OROMUCOSAL | Status: DC | PRN
  Filled 2018-09-24: qty 177

## 2018-09-24 MED ORDER — HYDROMORPHONE HCL 1 MG/ML IJ SOLN
INTRAMUSCULAR | Status: AC
  Administered 2018-09-24: 1 mg via INTRAVENOUS
  Filled 2018-09-24: qty 1

## 2018-09-24 MED ORDER — MAGNESIUM SULFATE 40 G IN LACTATED RINGERS - SIMPLE
2.0000 g/h | INTRAVENOUS | Status: AC
  Administered 2018-09-24: 2 g/h via INTRAVENOUS
  Filled 2018-09-24 (×2): qty 500

## 2018-09-24 MED ORDER — ENALAPRIL MALEATE 5 MG PO TABS
5.0000 mg | ORAL_TABLET | Freq: Every day | ORAL | Status: DC
  Administered 2018-09-24 – 2018-09-25 (×2): 5 mg via ORAL
  Filled 2018-09-24 (×3): qty 1

## 2018-09-24 MED ORDER — DIPHENHYDRAMINE HCL 25 MG PO CAPS
25.0000 mg | ORAL_CAPSULE | Freq: Once | ORAL | Status: AC
  Administered 2018-09-24: 25 mg via ORAL
  Filled 2018-09-24: qty 1

## 2018-09-24 MED ORDER — HYDROMORPHONE HCL 1 MG/ML IJ SOLN: 1.0000 mg | Freq: Once | INTRAMUSCULAR | Status: DC

## 2018-09-24 MED ORDER — ACETAMINOPHEN 325 MG PO TABS: 650.0000 mg | ORAL_TABLET | Freq: Once | ORAL | Status: AC

## 2018-09-24 NOTE — Addendum Note (Signed)
Addendum  created 09/24/18 0953 by [name], CRNA   Order list changed, Sign clinical note

## 2018-09-24 NOTE — Progress Notes (Signed)
Subjective: Postpartum Day 1: Cesarean Delivery Patient reports incisional pain.  Feels very light headed when standing. Still with low platelets this am. On Magnesium x 24 hours  Objective: Vital signs in last 24 hours: Temp:  [97.6 F (36.4 C)-98 F (36.7 C)] 98 F (36.7 C) (09/29 0400) Pulse Rate:  [73-99] 94 (09/29 0400) Resp:  [10-21] 16 (09/29 0605) BP: (113-160)/(90-112) 136/101 (09/29 0400) SpO2:  [97 %-100 %] 100 % (09/29 0400) Weight:  [87.1 kg] 87.1 kg (09/28 2015)  Physical Exam:  General: alert, cooperative and appears stated age Lochia: appropriate Uterine Fundus: firm Incision: Dressing clean, appropriately tender DVT Evaluation: No evidence of DVT seen on physical exam.  CBC Latest Ref Rng & Units 09/24/2018 09/23/2018 09/23/2018  WBC 4.0 - 10.5 K/uL 18.6(H) 16.3(H) 10.8(H)  Hemoglobin 12.0 - 15.0 g/dL 7.5(L) 8.3(L) 8.9(L)  Hematocrit 36.0 - 46.0 % 21.3(L) 23.1(L) 26.2(L)  Platelets 150 - 400 K/uL 73(L) 80(L) 111(L)    Assessment/Plan: Status post Cesarean section. Doing well postoperatively.  Coagulopathy resolved Last fibrinogen 225 Acute blood loss anemia--will give 2 units this am Continue current care. D/C magnesium at 24 hours Add BP med due to persistently elevated BP's Add lovenox Breast feeding IUD  Donnamae Jude 09/24/2018, 7:53 AM

## 2018-09-24 NOTE — Progress Notes (Signed)
DEBP set up for patient and patient encouraged to pump every 3 hours. Patient states, "I'm too tired right now, but I will later."

## 2018-09-24 NOTE — Anesthesia Postprocedure Evaluation (Signed)
Anesthesia Post Note  Patient: Terri Estes  Procedure(s) Performed: CESAREAN SECTION (N/A Abdomen)     Patient location during evaluation: [facility] Anesthesia Type: General Level of consciousness: awake and alert Pain management: satisfactory to patient Vital Signs Assessment: post-procedure vital signs reviewed and stable Respiratory status: spontaneous breathing and respiratory function stable Cardiovascular status: stable Postop Assessment: adequate PO intake Anesthetic complications: no    Last Vitals:  Vitals:   09/24/18 0605 09/24/18 0805  BP:  140/90  Pulse:  95  Resp: 16 18  Temp:  36.7 C  SpO2:  100%    Last Pain:  Vitals:   09/24/18 0854  TempSrc:   PainSc: 10-Worst pain ever   Pain Goal: Patients Stated Pain Goal: 3 (09/23/18 1915)               Katherina Mires

## 2018-09-25 LAB — CBC
HEMATOCRIT: 25.4 % — AB (ref 36.0–46.0)
HEMOGLOBIN: 9.1 g/dL — AB (ref 12.0–15.0)
MCH: 31.8 pg (ref 26.0–34.0)
MCHC: 35.8 g/dL (ref 30.0–36.0)
MCV: 88.8 fL (ref 78.0–100.0)
Platelets: 68 10*3/uL — ABNORMAL LOW (ref 150–400)
RBC: 2.86 MIL/uL — AB (ref 3.87–5.11)
RDW: 15.3 % (ref 11.5–15.5)
WBC: 14.8 10*3/uL — AB (ref 4.0–10.5)

## 2018-09-25 LAB — RAPID URINE DRUG SCREEN, HOSP PERFORMED
AMPHETAMINES: NOT DETECTED
BENZODIAZEPINES: POSITIVE — AB
Barbiturates: NOT DETECTED
COCAINE: NOT DETECTED
OPIATES: NOT DETECTED
Tetrahydrocannabinol: NOT DETECTED

## 2018-09-25 LAB — PROTEIN / CREATININE RATIO, URINE
Creatinine, Urine: 47 mg/dL
PROTEIN CREATININE RATIO: 1.17 mg/mg{creat} — AB (ref 0.00–0.15)
Total Protein, Urine: 55 mg/dL

## 2018-09-25 MED ORDER — VANCOMYCIN HCL IN DEXTROSE 1-5 GM/200ML-% IV SOLN: 1000.0000 mg | Freq: Two times a day (BID) | INTRAVENOUS | Status: DC

## 2018-09-25 MED ORDER — ENALAPRIL MALEATE 5 MG PO TABS
5.0000 mg | ORAL_TABLET | Freq: Once | ORAL | Status: AC
  Administered 2018-09-25: 5 mg via ORAL
  Filled 2018-09-25: qty 1

## 2018-09-25 MED ORDER — ENALAPRIL MALEATE 10 MG PO TABS
10.0000 mg | ORAL_TABLET | Freq: Every day | ORAL | Status: DC
  Administered 2018-09-26: 10 mg via ORAL
  Filled 2018-09-25 (×2): qty 1

## 2018-09-25 MED ORDER — FUROSEMIDE 10 MG/ML IJ SOLN
20.0000 mg | Freq: Once | INTRAMUSCULAR | Status: AC
  Administered 2018-09-25: 20 mg via INTRAVENOUS
  Filled 2018-09-25: qty 2

## 2018-09-25 NOTE — Progress Notes (Signed)
Pt left the nicu and has not returned to the unit for BP check and medications.

## 2018-09-25 NOTE — Progress Notes (Signed)
Patient was not in her room for her treatment. Bathroom door was found locked and no one was in there. RN called to room to verify that the patient was missing. Called NICU to ask if she was in NICU seeing her baby, and the RN said she left over 10 minutes ago.

## 2018-09-25 NOTE — Progress Notes (Signed)
Subjective: Postpartum Day 2: Cesarean Delivery Patient reports incisional pain, tolerating PO and no problems voiding. Feels sad and anxious, she is declining Zoloft   Objective: Vital signs in last 24 hours: Temp:  [97.9 F (36.6 C)-98.7 F (37.1 C)] 98 F (36.7 C) (09/30 1410) Pulse Rate:  [88-98] 97 (09/30 1410) Resp:  [16-21] 18 (09/30 1410) BP: (142-172)/(79-108) 142/104 (09/30 1410) SpO2:  [97 %-100 %] 100 % (09/30 1410)  Physical Exam:  General: alert, cooperative and appears stated age 32: appropriate Uterine Fundus: firm Incision: no significant drainage DVT Evaluation: No evidence of DVT seen on physical exam.  Recent Labs    09/23/18 2314 09/24/18 0603  HGB 8.3* 7.5*  HCT 23.1* 21.3*    Assessment/Plan: Status post Cesarean section. Doing well postoperatively.  Elevated BPs continue with increasing swelling IV lasix today--increase Vasotec to 10 mg daily Continue current care.  Donnamae Jude 09/25/2018, 3:25 PM

## 2018-09-25 NOTE — Progress Notes (Signed)
BP 152/100 Pt tearful and audibly stressed.  Rn encouraged pt to stay on unit due to elevated BP. Calming environment encouraged.

## 2018-09-25 NOTE — Lactation Note (Signed)
This note was copied from a baby's chart. Lactation Consultation Note: Initial visit with this mom of NICU baby born at 30.2 [employer] Jumper. Mom states she pumped once yesterday and did not obtain any milk. Obtained a few drops with hand expression. States she didn't feel good yesterday and received blood so she only pumped once. Encouraged to pump q 3 hours today 8 times/24 hours. States she remembers engorgement from the last baby and doesn't want to go through that again. I will send referral to [facility] for pump for home. No questions at present. BF brochure given with our phone number, to call with questions after DC. Going to visit baby in NICU.  Patient Name: Terri Estes Today's Date: 09/25/2018 Reason for consult: Initial assessment;Preterm <34wks;NICU baby   Maternal Data Formula Feeding for Exclusion: No Does the patient have breastfeeding experience prior to this delivery?: Yes  Feeding    LATCH Score                   Interventions    Lactation Tools Discussed/Used WIC Program: Yes Initiated by:: RN Date initiated:: 09/24/18   Consult Status Consult Status: Follow-up Date: 09/26/18 Follow-up type: In-patient    [name] 09/25/2018, 9:58 AM

## 2018-09-26 ENCOUNTER — Encounter (HOSPITAL_COMMUNITY): Payer: Self-pay | Admitting: Family Medicine

## 2018-09-26 ENCOUNTER — Other Ambulatory Visit: Payer: Self-pay | Admitting: Family Medicine

## 2018-09-26 LAB — CBC WITH DIFFERENTIAL/PLATELET
BASOS PCT: 1 %
Basophils Absolute: 0.1 10*3/uL (ref 0.0–0.1)
EOS ABS: 0.1 10*3/uL (ref 0.0–0.7)
EOS PCT: 1 %
HCT: 23.4 % — ABNORMAL LOW (ref 36.0–46.0)
Hemoglobin: 7.9 g/dL — ABNORMAL LOW (ref 12.0–15.0)
Lymphocytes Relative: 20 %
Lymphs Abs: 2.9 10*3/uL (ref 0.7–4.0)
MCH: 31.2 pg (ref 26.0–34.0)
MCHC: 33.8 g/dL (ref 30.0–36.0)
MCV: 92.5 fL (ref 78.0–100.0)
MONO ABS: 1.1 10*3/uL (ref 0.1–1.0)
MONOS PCT: 7 %
Neutro Abs: 10.6 10*3/uL (ref 1.7–7.7)
Neutrophils Relative %: 71 %
Platelets: 76 10*3/uL — ABNORMAL LOW (ref 150–400)
RBC: 2.53 MIL/uL — ABNORMAL LOW (ref 3.87–5.11)
RDW: 15.3 % (ref 11.5–15.5)
WBC: 14.8 10*3/uL — ABNORMAL HIGH (ref 4.0–10.5)

## 2018-09-26 MED ORDER — SERTRALINE HCL 100 MG PO TABS: ORAL_TABLET | ORAL | 2 refills | Status: DC

## 2018-09-26 MED ORDER — ALBUTEROL SULFATE HFA 108 (90 BASE) MCG/ACT IN AERS: 2.0000 | INHALATION_SPRAY | Freq: Four times a day (QID) | RESPIRATORY_TRACT | 2 refills | Status: DC

## 2018-09-26 MED ORDER — ENALAPRIL MALEATE 10 MG PO TABS: 10.0000 mg | ORAL_TABLET | Freq: Every day | ORAL | 0 refills | Status: DC

## 2018-09-26 MED ORDER — CYCLOBENZAPRINE HCL 10 MG PO TABS: 10.0000 mg | ORAL_TABLET | Freq: Three times a day (TID) | ORAL | 2 refills | Status: AC | PRN

## 2018-09-26 MED ORDER — OXYCODONE-ACETAMINOPHEN 5-325 MG PO TABS: 1.0000 | ORAL_TABLET | ORAL | 0 refills | Status: DC | PRN

## 2018-09-26 MED ORDER — FERROUS SULFATE 325 (65 FE) MG PO TABS
325.0000 mg | ORAL_TABLET | Freq: Three times a day (TID) | ORAL | Status: DC
  Administered 2018-09-26: 325 mg via ORAL
  Filled 2018-09-26: qty 1

## 2018-09-26 MED ORDER — FERROUS SULFATE 325 (65 FE) MG PO TABS: 325.0000 mg | ORAL_TABLET | Freq: Three times a day (TID) | ORAL | 3 refills | Status: DC

## 2018-09-26 NOTE — Progress Notes (Signed)
RN helped patient with baby scripts, we downloaded the app to her phone and she was instructed on how to use the app.  Her discharge instructions was given to her and she states understanding.  She is pumping and will call out to be walked down.  Her FOB is here to take her home.

## 2018-09-26 NOTE — Discharge Summary (Addendum)
OB Discharge Summary  Patient Name: Terri Estes DOB: 1986-08-15 MRN: [id number]  Date of admission: 09/23/2018 Delivering MD: Donnamae Jude   Date of discharge: 09/26/2018  Admitting diagnosis: 30.3 WKS, BLEEDING Intrauterine pregnancy: [redacted]w[redacted]d     Secondary diagnosis:Principal Problem:   Abruptio placentae syndrome, third trimester Active Problems:   History of cesarean delivery   Hx of preeclampsia, prior pregnancy, currently pregnant   History of substance abuse ([place])   Acute blood loss anemia   DIC (disseminated intravascular coagulation) ([place])   S/P cesarean section   Pre-eclampsia with severe features with acute kidney injury due to this  Additional problems: Patient Active Problem List   Diagnosis Date Noted  . 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 Acute blood loss anemia 09/23/2018  . DIC (disseminated intravascular coagulation) ([place]) 09/23/2018  . S/P cesarean section 09/23/2018  . Inadequate social support 07/21/2018  . Sciatica 07/21/2018  . Abdominal muscle strain, initial encounter 07/21/2018  . History of substance abuse ([place]) 06/12/2018  . Supervision of other normal pregnancy, antepartum 05/10/2018  . Short interval between pregnancies affecting pregnancy, antepartum 05/10/2018  . Hx of preeclampsia, prior pregnancy, currently pregnant 05/10/2018  . History of preterm delivery, currently pregnant 05/10/2018  . Depression affecting pregnancy, antepartum 05/10/2018  . HSIL (high grade squamous intraepithelial lesion) on Pap smear of cervix 12/24/2016  . History of cesarean delivery 12/16/2016        Discharge diagnosis: Preterm Pregnancy Delivered, Preeclampsia (severe), Anemia, PPH and Acute blood loss anemia, DIC                                                                     Post partum procedures:blood transfusion  Augmentation: N/A  Complications: Placental Abruption and Hemorrhage>1088mL  Hospital course:   Sceduled C/S   32 y.o. yo [id number] at [redacted]w[redacted]d was admitted to the hospital 09/23/2018 for abruption and emergent C-section. Membrane Rupture Time/Date: 1:04 PM ,09/23/2018   Patient delivered a Viable infant.09/23/2018  Details of operation can be found in separate operative note. Hemorrhage and massive DIC noted with [place] uterus. Given 2 u PRBC's and TXA and FFP intraop with hemostasis. Postoperatively given Magnesium x 24 hours. BP elevated and begun on Vasotec. Dose increased once with good control. Anemia was symptomatic and received additional 2 u PRBC's postop day #1. Pateint watched until postop day #3 when she was noted to be ambulating, tolerating a regular diet, passing flatus, and urinating well. Patient is discharged home in stable condition on  09/26/18         Physical exam  Vitals:   09/25/18 1955 09/25/18 2346 09/26/18 0455 09/26/18 0810  BP:  119/77 (!) 129/99 119/85  Pulse:  (!) 105 97 86  Resp:  16 16 18   Temp:  98.5 F (36.9 C) 98 F (36.7 C) 98.7 F (37.1 C)  TempSrc:  Oral Oral Oral  SpO2: 99% 99% 100% 96%  Weight:      Height:       General: alert, cooperative and no distress Lochia: appropriate Uterine Fundus: firm Incision: Dressing is clean, dry, and intact DVT Evaluation: No evidence of DVT seen on physical exam. Labs: Lab Results  Component Value Date   WBC 14.8 (  H) 09/26/2018   HGB 7.9 (L) 09/26/2018   HCT 23.4 (L) 09/26/2018   MCV 92.5 09/26/2018   PLT 76 (L) 09/26/2018   CMP Latest Ref Rng & Units 09/23/2018  Glucose 70 - 99 mg/dL 64(L)  BUN 6 - 20 mg/dL 18  Creatinine 0.44 - 1.00 mg/dL 1.82(H)  Sodium 135 - 145 mmol/L 135  Potassium 3.5 - 5.1 mmol/L 3.4(L)  Chloride 98 - 111 mmol/L 103  CO2 22 - 32 mmol/L 22  Calcium 8.9 - 10.3 mg/dL 8.5(L)  Total Protein 6.5 - 8.1 g/dL 6.0(L)  Total Bilirubin 0.3 - 1.2 mg/dL 0.5  Alkaline Phos 38 - 126 U/L 107  AST 15 - 41 U/L 30  ALT 0 - 44 U/L 11    Discharge instruction: per After Visit Summary and  "Baby and Me Booklet".  After Visit Meds:  Allergies as of 09/26/2018   No Known Allergies     Medication List    STOP taking these medications   acetaminophen 500 MG tablet Commonly known as:  TYLENOL   calcium carbonate 500 MG chewable tablet Commonly known as:  TUMS - dosed in mg elemental calcium   Doxylamine-Pyridoxine 10-10 MG Tbec   pantoprazole 40 MG tablet Commonly known as:  PROTONIX   polyethylene glycol packet Commonly known as:  MIRALAX / GLYCOLAX     TAKE these medications   albuterol 108 (90 Base) MCG/ACT inhaler Commonly known as:  PROVENTIL HFA;VENTOLIN HFA Inhale 2 puffs into the lungs 4 (four) times daily.   aspirin EC 81 MG tablet Take 1 tablet (81 mg total) by mouth daily. Take after 12 weeks for prevention of preeclampsia later in pregnancy   buprenorphine 8 MG Subl SL tablet Commonly known as:  SUBUTEX Place 8 mg under the tongue daily.   buprenorphine 2 MG Subl SL tablet Commonly known as:  SUBUTEX Place 4 mg under the tongue at bedtime.   cyclobenzaprine 10 MG tablet Commonly known as:  FLEXERIL Take 1 tablet (10 mg total) by mouth 3 (three) times daily as needed for muscle spasms.   enalapril 10 MG tablet Commonly known as:  VASOTEC TAKE 1 TABLET BY MOUTH DAILY   ferrous sulfate 325 (65 FE) MG tablet Take 1 tablet (325 mg total) by mouth 3 (three) times daily with meals.   oxyCODONE-acetaminophen 5-325 MG tablet Commonly known as:  PERCOCET/ROXICET Take 1 tablet by mouth every 4 (four) hours as needed (pain scale 4-7).   PRENATAL GUMMIES/DHA & FA 0.4-32.5 MG Chew Chew 2 each by mouth daily.   QVAR IN Inhale 2 puffs into the lungs 2 (two) times daily.   sertraline 100 MG tablet Commonly known as:  ZOLOFT TK 1 T PO QD       Diet: routine diet  Activity: Advance as tolerated. Pelvic rest for 6 weeks.   Outpatient follow up:2 weeks Follow up Appt: Future Appointments  Date Time Provider [place]  09/27/2018  11:00 AM Woodroe Mode, MD [place] None   Follow up visit: No follow-ups on file.  Postpartum contraception: IUD Mirena  Newborn Data: Live born female  Birth Weight: 3 lb 4.6 oz (1490 g) APGAR: 1, 6  Newborn Delivery   Birth date/time:  09/23/2018 13:05:00 Delivery type:  C-Section, Low Transverse Trial of labor:  No C-section categorization:  Repeat     Baby Feeding: Bottle and Breast Disposition:NICU   09/26/2018 Donnamae Jude, MD

## 2018-09-26 NOTE — Lactation Note (Signed)
This note was copied from a baby's chart. Lactation Consultation Note  Patient Name: Terri Estes Today's Date: 09/26/2018    [facility] Follow Up Visit:  Mother not in room at the present time; will attempt to return later today.                [place] R Okie Jansson 09/26/2018, 11:25 AM

## 2018-09-27 ENCOUNTER — Encounter: Payer: Medicaid Other | Admitting: Obstetrics & Gynecology

## 2018-09-27 LAB — TYPE AND SCREEN
ABO/RH(D): A POS
ANTIBODY SCREEN: NEGATIVE
UNIT DIVISION: 0
UNIT DIVISION: 0
UNIT DIVISION: 0
Unit division: 0
Unit division: 0
Unit division: 0

## 2018-09-27 LAB — BPAM RBC
BLOOD PRODUCT EXPIRATION DATE: 201910142359
BLOOD PRODUCT EXPIRATION DATE: 201910172359
BLOOD PRODUCT EXPIRATION DATE: 201910222359
BLOOD PRODUCT EXPIRATION DATE: 201910222359
Blood Product Expiration Date: 201910222359
Blood Product Expiration Date: 201910252359
ISSUE DATE / TIME: 201909281337
ISSUE DATE / TIME: 201909281337
ISSUE DATE / TIME: 201909281359
ISSUE DATE / TIME: 201909281359
ISSUE DATE / TIME: 201909291005
ISSUE DATE / TIME: 201909291440
UNIT TYPE AND RH: 6200
UNIT TYPE AND RH: 6200
UNIT TYPE AND RH: 6200
Unit Type and Rh: 6200
Unit Type and Rh: 6200
Unit Type and Rh: 6200

## 2018-09-28 ENCOUNTER — Ambulatory Visit (HOSPITAL_COMMUNITY): Payer: Medicaid Other

## 2018-09-28 ENCOUNTER — Encounter (HOSPITAL_COMMUNITY): Payer: Self-pay

## 2018-09-29 DIAGNOSIS — N179 Acute kidney failure, unspecified: Secondary | ICD-10-CM

## 2018-10-02 ENCOUNTER — Ambulatory Visit (INDEPENDENT_AMBULATORY_CARE_PROVIDER_SITE_OTHER): Payer: Medicaid Other | Admitting: Obstetrics

## 2018-10-02 ENCOUNTER — Encounter: Payer: Self-pay | Admitting: Obstetrics

## 2018-10-02 DIAGNOSIS — Z1389 Encounter for screening for other disorder: Secondary | ICD-10-CM

## 2018-10-02 MED ORDER — OXYCODONE-ACETAMINOPHEN 5-325 MG PO TABS: 1.0000 | ORAL_TABLET | ORAL | 0 refills | Status: DC | PRN

## 2018-10-02 NOTE — Progress Notes (Signed)
Subjective:     Terri Estes is a 32 y.o. female who presents for a postpartum visit. She is 2 weeks postpartum following an emergent repeat low cervical transverse Cesarean section for placental abruption. I have fully reviewed the prenatal and intrapartum course. The delivery was at 30 gestational weeks. Outcome: repeat cesarean section, low transverse incision. Anesthesia: General. Postpartum course has been normal. Baby's course has been in NICU. Baby is feeding by bottle - unknown. Bleeding thin lochia. Bowel function is normal. Bladder function is normal. Patient is not sexually active. Contraception method is abstinence. Postpartum depression screening: negative.  Tobacco, alcohol and substance abuse history reviewed.  Adult immunizations reviewed including TDAP, rubella and varicella.  The following portions of the patient's history were reviewed and updated as appropriate: allergies, current medications, past family history, past medical history, past social history, past surgical history and problem list.  Review of Systems A comprehensive review of systems was negative.   Objective:    BP 114/79   Pulse (!) 112   Wt 183 lb 12.8 oz (83.4 kg)   LMP 02/23/2018   Breastfeeding? Yes   BMI 32.56 kg/m    PE:            General:  Alert and no distress          Lungs:  Clear          Heart:  RRR          Abdomen:  Soft, nontender.  Incision clean, dry and intact.   >50% of 15 min visit spent on counseling and coordination of care.   Assessment:     1. Postpartum care following cesarean delivery Rx: - oxyCODONE-acetaminophen (PERCOCET/ROXICET) 5-325 MG tablet; Take 1 tablet by mouth every 4 (four) hours as needed (pain scale 4-7).  Dispense: 30 tablet; Refill: 0   Plan:    1. Contraception:  2. Continue Iron / PNV's 3. Follow up in: 4 weeks or as needed.    Shelly Bombard MD 10-02-2018

## 2018-10-02 NOTE — Progress Notes (Signed)
Patient is in the office for incision check, c section 09-23-18, denies any complications.

## 2018-10-10 ENCOUNTER — Ambulatory Visit: Payer: Self-pay

## 2018-10-10 NOTE — Lactation Note (Signed)
This note was copied from a baby's chart. Lactation Consultation Note Baby 65 weeks old. Mom visiting baby. Having questions about BM, pumping, breast pain, decreased milk supply, one breast not giving milk. Mom is eating oatmeal, taking fenugreek, Mothers Milk, Gateraide orange and red.  One issue is mom isn't pumping consistently. Stressed importance of pumping on a schedule every 2 1/2 - 3 hrs to get milk supply up.  Mom states she's pumping 4 times a day on a good day and none during the night. Mom stated she only pumped 2 times today. Mom is here during the night d/t worried about her daughter. Baby has hemorrhage/hydrocephalas. Something new.   Mom states Lt. Breast hurts to Lt. Outer quadrant of the breast. Tender to touch. No knots noted. Rt. Breast w/knots and fuller. Rt. Breast is a good cup size larger than Lt. LC had to massage breast several minutes to be able to hand express drops of milk.  Discussed mom pumping. Mom has hands free bra and her pump parts. Took mom to pumping room. Assisted in pumping. Lt. Breast started producing colostrum before the Rt. Lt. Nipple larger than Rt. A 30 flange given. Mom stated that [facility] had given her the 30 flange for that nipples. Noted more milk came out w/#27 flange.  Covered mom's breast. distracted mom w/her favorite music. Noted increase in milk when mom laughing and relaxed. LC massaged breast while pumping. Mom stated Lt. Breast not sore any more  Like they were. No further knots noted in Rt. Breast. Only a few drops collected from Lt. Breast 1 ml  6 ml from Rt. Breast. Total 7 ml given for the baby.mom kept saying she needed to get her milk supply up because she needed to get her milk supply on.   Suggested power pumping once a day until milk supply comes in.  Call OB ask for Reglan Pump on schedule 2 1/2 - 3 hrs. Hand express afterwards.   Mom has [place]oh DEBP. Mom states she usually get approx. 28ml from Rt. And a drop from Lt. Mom  discouraged. Asked mom to call for [facility] consult.   Collected 7 drops.  Patient Name: Terri Estes [id number] Date: 10/10/2018 Reason for consult: Follow-up assessment;Mother's request;NICU baby   Maternal Data Has patient been taught Hand Expression?: Yes  Feeding Feeding Type: Breast Milk  LATCH Score       Type of Nipple: Everted at rest and after stimulation  Comfort (Breast/Nipple): Filling, red/small blisters or bruises, mild/mod discomfort        Interventions Interventions: DEBP;Comfort gels;Hand express;Breast massage;Breast compression;Expressed milk  Lactation Tools Discussed/Used Tools: Pump;Comfort gels;Flanges Flange Size: 30 Breast pump type: Double-Electric Breast Pump   Consult Status Consult Status: Follow-up Date: 10/12/18 Follow-up type: In-patient    Lamiah Marmol, Elta [place] 10/10/2018, 6:07 AM

## 2018-10-30 ENCOUNTER — Ambulatory Visit: Payer: Medicaid Other | Admitting: Obstetrics

## 2018-12-04 ENCOUNTER — Ambulatory Visit: Payer: Medicaid Other | Admitting: Obstetrics

## 2018-12-06 ENCOUNTER — Telehealth: Payer: Self-pay | Admitting: Obstetrics

## 2018-12-11 ENCOUNTER — Encounter: Payer: Self-pay | Admitting: Obstetrics

## 2019-02-14 NOTE — Telephone Encounter (Signed)
Error

## 2019-03-15 ENCOUNTER — Other Ambulatory Visit: Payer: Self-pay | Admitting: Family Medicine

## 2019-03-16 ENCOUNTER — Telehealth: Payer: Self-pay

## 2019-03-16 NOTE — Telephone Encounter (Signed)
Returned call, no answer, left vm 

## 2019-04-11 ENCOUNTER — Other Ambulatory Visit: Payer: Self-pay | Admitting: Specialist

## 2019-04-11 ENCOUNTER — Emergency Department (HOSPITAL_COMMUNITY)
Admission: EM | Admit: 2019-04-11 | Discharge: 2019-04-11 | Disposition: A | Discharge status: Home / Self Care | Payer: Medicaid Other | Attending: Emergency Medicine | Admitting: Emergency Medicine

## 2019-04-11 ENCOUNTER — Emergency Department (HOSPITAL_COMMUNITY): Payer: Medicaid Other

## 2019-04-11 ENCOUNTER — Other Ambulatory Visit: Payer: Self-pay

## 2019-04-11 DIAGNOSIS — F17210 Nicotine dependence, cigarettes, uncomplicated: Secondary | ICD-10-CM | POA: Insufficient documentation

## 2019-04-11 DIAGNOSIS — R220 Localized swelling, mass and lump, head: Secondary | ICD-10-CM | POA: Diagnosis present

## 2019-04-11 DIAGNOSIS — J45909 Unspecified asthma, uncomplicated: Secondary | ICD-10-CM | POA: Insufficient documentation

## 2019-04-11 DIAGNOSIS — Z79899 Other long term (current) drug therapy: Secondary | ICD-10-CM | POA: Diagnosis not present

## 2019-04-11 DIAGNOSIS — N6310 Unspecified lump in the right breast, unspecified quadrant: Secondary | ICD-10-CM | POA: Insufficient documentation

## 2019-04-11 LAB — CBC WITH DIFFERENTIAL/PLATELET
Abs Immature Granulocytes: 0.02 10*3/uL (ref 0.00–0.07)
Basophils Absolute: 0 10*3/uL (ref 0.0–0.1)
Basophils Relative: 0 %
Eosinophils Absolute: 0.1 10*3/uL (ref 0.0–0.5)
Eosinophils Relative: 1 %
HCT: 35.6 % — ABNORMAL LOW (ref 36.0–46.0)
Hemoglobin: 11.4 g/dL — ABNORMAL LOW (ref 12.0–15.0)
Immature Granulocytes: 0 %
Lymphocytes Relative: 20 %
Lymphs Abs: 1.6 10*3/uL (ref 0.7–4.0)
MCH: 29.2 pg (ref 26.0–34.0)
MCHC: 32 g/dL (ref 30.0–36.0)
MCV: 91.3 fL (ref 80.0–100.0)
Monocytes Absolute: 0.4 10*3/uL (ref 0.1–1.0)
Monocytes Relative: 6 %
Neutro Abs: 5.7 10*3/uL (ref 1.7–7.7)
Neutrophils Relative %: 73 %
Platelets: 270 10*3/uL (ref 150–400)
RBC: 3.9 MIL/uL (ref 3.87–5.11)
RDW: 13.8 % (ref 11.5–15.5)
WBC: 7.8 10*3/uL (ref 4.0–10.5)
nRBC: 0 % (ref 0.0–0.2)

## 2019-04-11 LAB — BASIC METABOLIC PANEL
Anion gap: 13 (ref 5–15)
BUN: 7 mg/dL (ref 6–20)
CO2: 23 mmol/L (ref 22–32)
Calcium: 9.3 mg/dL (ref 8.9–10.3)
Chloride: 101 mmol/L (ref 98–111)
Creatinine, Ser: 1.08 mg/dL — ABNORMAL HIGH (ref 0.44–1.00)
GFR calc Af Amer: >60 mL/min (ref >60)
GFR calc non Af Amer: >60 mL/min (ref >60)
Glucose, Bld: 81 mg/dL (ref 70–99)
Potassium: 3.3 mmol/L — ABNORMAL LOW (ref 3.5–5.1)
Sodium: 137 mmol/L (ref 135–145)

## 2019-04-11 LAB — I-STAT BETA HCG BLOOD, ED (MC, WL, AP ONLY): I-stat hCG, quantitative: <5 m[IU]/mL (ref <5)

## 2019-04-11 MED ORDER — DEXAMETHASONE SODIUM PHOSPHATE 10 MG/ML IJ SOLN
10.0000 mg | Freq: Once | INTRAMUSCULAR | Status: AC
  Administered 2019-04-11: 04:00:00 10 mg via INTRAVENOUS
  Filled 2019-04-11: qty 1

## 2019-04-11 MED ORDER — SODIUM CHLORIDE 0.9 % IV BOLUS
1000.0000 mL | Freq: Once | INTRAVENOUS | Status: AC
  Administered 2019-04-11: 1000 mL via INTRAVENOUS

## 2019-04-11 MED ORDER — KETOROLAC TROMETHAMINE 30 MG/ML IJ SOLN
30.0000 mg | Freq: Once | INTRAMUSCULAR | Status: AC
  Administered 2019-04-11: 30 mg via INTRAVENOUS
  Filled 2019-04-11: qty 1

## 2019-04-11 MED ORDER — IOHEXOL 300 MG/ML  SOLN
75.0000 mL | Freq: Once | INTRAMUSCULAR | Status: AC | PRN
  Administered 2019-04-11: 05:00:00 75 mL via INTRAVENOUS

## 2019-04-11 MED ORDER — IOHEXOL 300 MG/ML  SOLN
75.0000 mL | Freq: Once | INTRAMUSCULAR | Status: AC | PRN
  Administered 2019-04-11: 75 mL via INTRAVENOUS

## 2019-04-11 NOTE — ED Notes (Signed)
Patient transported to CT 

## 2019-04-11 NOTE — ED Notes (Signed)
ED Provider at bedside. 

## 2019-04-11 NOTE — Discharge Instructions (Addendum)
Imaging to be scheduled for tomorrow or Friday at the [facility]. I spoke with the radiologist who is confident imaging will take place either tomorrow or Friday. [facility] tomorrow morning to set up your appointment.  Imaging to be done: Diagnostic mammogram Ultrasound Biopsy  Your primary care doctor was also contacted about today's visit. That office will receive the results of your testing and you will likely follow up with your primary care doctor first.  Continue taking your home medications as prescribed.  Please return to the emergency department for any new or worsening symptoms.  Thank you for allowing [place] to be involved in your care today.

## 2019-04-11 NOTE — ED Triage Notes (Signed)
Pt reports noticing swelling on the left side of her face three days ago.  Despite using OTC medications and two days of old antibiotics she had it continues to swell causing her pain.  Denies fevers, is able to speak in complete sentences but the pain is moving into the ear and left side of her head.

## 2019-04-11 NOTE — ED Provider Notes (Signed)
[facility] EMERGENCY DEPARTMENT Provider Note   CSN: [id number] Arrival date & time: 04/11/19  2951    History   Chief Complaint Chief Complaint  Patient presents with   Facial Swelling    HPI Terri Estes is a 33 y.o. 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  The patient states that she noticed a "knot" on the left side of her neck several day ago.  She reports the pain is constant and has significantly worsened along with the swelling over the last 24 hours.  She reports that she is not been eating and drinking as well since her symptoms worsen because she feels as if her throat is closing when she swallows.  Reports that the pain radiates up to her left ear.  Reports associated headache and dysphagia. She reports that she took 2 tablets of each head that she had at home from an old prescription yesterday without improvement in her symptoms.  She denies of fever, chills, trismus, tongue swelling, rash, shortness of breath, or sore throat.   She reports that she did have pain in her left upper tooth several months ago, but reports that that completely resolved and she has not had any recent dental pain.  She reports that she is currently breast-feeding and has a 42-month-old at home.     The history is provided by the patient. No language interpreter was used.    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   Acute blood loss anemia 09/23/2018   DIC (disseminated intravascular coagulation) ([place]) 09/23/2018   S/P cesarean section 09/23/2018   Inadequate social support  07/21/2018   Sciatica 07/21/2018   Abdominal muscle strain, initial encounter 07/21/2018   History of substance abuse ([place]) 06/12/2018   Supervision of other normal pregnancy, antepartum 05/10/2018   Short interval between pregnancies affecting pregnancy, antepartum 05/10/2018   Hx of preeclampsia, prior pregnancy, currently pregnant 05/10/2018   History of preterm delivery, currently pregnant 05/10/2018   Depression affecting pregnancy, antepartum 05/10/2018   HSIL (high grade squamous intraepithelial lesion) on Pap smear of cervix 12/24/2016   History of cesarean delivery 12/16/2016    Past Surgical History:  Procedure Laterality Date   CESAREAN SECTION  05/26/2008   CESAREAN SECTION N/A 07/10/2017   Procedure: CESAREAN SECTION;  Surgeon: Florian Buff, MD;  Location: [facility];  Service: Obstetrics;  Laterality: N/A;   CESAREAN SECTION N/A 09/23/2018   Procedure: CESAREAN SECTION;  Surgeon: Donnamae Jude, MD;  Location: [facility];  Service: Obstetrics;  Laterality: N/A;   COLPOSCOPY     WISDOM TOOTH EXTRACTION       OB History    Gravida  6   Para  3   Term  1   Preterm  2   AB  3   Living  3     SAB      TAB  3   Ectopic      Multiple  0   Live Births  3  Home Medications    Prior to Admission medications   Medication Sig Start Date End Date Taking? Authorizing Provider  albuterol (PROVENTIL HFA;VENTOLIN HFA) 108 (90 Base) MCG/ACT inhaler Inhale 2 puffs into the lungs 4 (four) times daily. 09/26/18  Yes Donnamae Jude, MD  buprenorphine (SUBUTEX) 2 MG SUBL SL tablet Place 4 mg under the tongue at bedtime.   Yes [provider]  buprenorphine (SUBUTEX) 8 MG SUBL SL tablet Place 8 mg under the tongue daily.    Yes [provider]  cyclobenzaprine (FLEXERIL) 10 MG tablet Take 1 tablet (10 mg total) by mouth 3 (three) times daily as needed for muscle spasms. 09/26/18  Yes Donnamae Jude, MD    enalapril (VASOTEC) 10 MG tablet TAKE 1 TABLET BY MOUTH DAILY Patient taking differently: Take 10 mg by mouth daily.  03/15/19  Yes Donnamae Jude, MD  ferrous sulfate 325 (65 FE) MG tablet Take 1 tablet (325 mg total) by mouth 3 (three) times daily with meals. 09/26/18  Yes Donnamae Jude, MD  Prenatal MV-Min-FA-Omega-3 (PRENATAL GUMMIES/DHA & FA) 0.4-32.5 MG CHEW Chew 2 each by mouth daily.   Yes [provider]    Family History Family History  Problem Relation Age of Onset   Kidney disease Maternal Grandmother    Kidney disease Maternal Grandfather    Kidney disease Paternal Grandmother    Diabetes Paternal Grandmother    Kidney disease Paternal Grandfather     Social History Social History   Tobacco Use   Smoking status: Current Some Day Smoker    Packs/day: 0.25    Years: 5.00    Pack years: 1.25    Types: Cigarettes   Smokeless tobacco: Never Used  Substance Use Topics   Alcohol use: No   Drug use: Not Currently    Comment: subutex     Allergies   Patient has no known allergies.   Review of Systems Review of Systems  Constitutional: Negative for activity change, chills and fever.  HENT: Positive for facial swelling. Negative for congestion, dental problem, drooling, ear discharge, ear pain, hearing loss, rhinorrhea, sore throat, tinnitus and trouble swallowing.   Respiratory: Negative for shortness of breath.   Cardiovascular: Negative for chest pain, palpitations and leg swelling.  Gastrointestinal: Negative for abdominal pain, diarrhea, nausea and vomiting.  Genitourinary: Negative for dysuria.  Musculoskeletal: Positive for myalgias and neck pain. Negative for back pain, gait problem and neck stiffness.  Skin: Negative for rash.  Allergic/Immunologic: Negative for immunocompromised state.  Neurological: Negative for dizziness, syncope, weakness, numbness and headaches.  Psychiatric/Behavioral: Negative for confusion.   Physical  Exam Updated Vital Signs BP (!) 129/93    Pulse 88    Temp 98.9 F (37.2 C) (Oral)    Resp 16    Ht 5\' 2"  (1.575 m)    Wt 90.7 kg    SpO2 100%    BMI 36.58 kg/m   Physical Exam Vitals signs and nursing note reviewed.  Constitutional:      General: She is not in acute distress. HENT:     Head: Normocephalic.     Jaw: There is normal jaw occlusion.     Mouth/Throat:     Mouth: Mucous membranes are moist.     Comments: Posterior oropharynx is unable to be visualized.  There is no sublingual induration or edema.  No focal tenderness to the bilateral upper and lower teeth.  Dentition.  Uvula is midline.  Patient is tolerating her own  secretions.  No drooling.  Voice is not muffled. Eyes:     Extraocular Movements: Extraocular movements intact.     Conjunctiva/sclera: Conjunctivae normal.     Pupils: Pupils are equal, round, and reactive to light.  Neck:     Musculoskeletal: Neck supple. Muscular tenderness present.     Comments: There is a large edematous area noted to the left neck that is tender to palpation.  There appears to be a fluctuant, ill-defined palpable mass to this area.  There is a minimal associated warmth and no overlying erythema.  It is not pulsatile.  No right-sided cervical lymphadenopathy or edema.  No palpable supraclavicular nodes bilaterally. Cardiovascular:     Rate and Rhythm: Normal rate and regular rhythm.     Heart sounds: No murmur. No friction rub. No gallop.   Pulmonary:     Effort: Pulmonary effort is normal. No respiratory distress.     Breath sounds: No stridor. No wheezing, rhonchi or rales.  Chest:     Chest wall: No tenderness.  Abdominal:     General: There is no distension.     Palpations: Abdomen is soft.     Tenderness: There is no abdominal tenderness.  Musculoskeletal:     Comments: Full active and passive range of motion of the cervical spine with flexion, extension, rotation, lateral flexion.  Skin:    General: Skin is warm.      Findings: No rash.  Neurological:     Mental Status: She is alert.  Psychiatric:        Behavior: Behavior normal.      ED Treatments / Results  Labs (all labs ordered are listed, but only abnormal results are displayed) Labs Reviewed  CBC WITH DIFFERENTIAL/PLATELET - Abnormal; Notable for the following components:      Result Value   Hemoglobin 11.4 (*)    HCT 35.6 (*)    All other components within normal limits  BASIC METABOLIC PANEL - Abnormal; Notable for the following components:   Potassium 3.3 (*)    Creatinine, Ser 1.08 (*)    All other components within normal limits  I-STAT BETA HCG BLOOD, ED ([facility], [facility], [facility] ONLY)    EKG None  Radiology Ct Soft Tissue Neck W Contrast  Result Date: 04/11/2019 CLINICAL DATA:  Swelling along the left side of the face beginning 3 days ago. Neck pain. Infection suspected. EXAM: CT NECK WITH CONTRAST TECHNIQUE: Multidetector CT imaging of the neck was performed using the standard protocol following the bolus administration of intravenous contrast. CONTRAST:  79mL OMNIPAQUE IOHEXOL 300 MG/ML  SOLN COMPARISON:  None. FINDINGS: Pharynx and larynx: No focal mucosal or submucosal lesions are present. The nasopharynx is unremarkable. The soft palate is within normal limits. The tongue base and palatine tonsils are normal. The epiglottis is within normal limits. The hypopharynx is unremarkable. Vocal cords are midline and symmetrical. The trachea is within normal limits. Salivary glands: The submandibular and parotid glands are within normal limits bilaterally. Thyroid: Normal. Lymph nodes: Enlarged posterior left level 2 lymph nodes are noted. There is inflammatory changes so seated with indistinct margins and stranding of the adjacent fat. The largest node measures up to 2 cm with a focal area of central hypoattenuation. A second enlarged node measures up to 13 mm. There is mild prominence of right level 2 nodes without the same degree of inflammation or  any evidence for necrosis. Inflammatory changes are noted along the medial aspect of the left SCM and below  the left parotid gland extending to the platysma. No other significant adenopathy is present. Vascular: Unremarkable Limited intracranial: Within normal limits. Visualized orbits: The globes and orbits are within normal limits. Mastoids and visualized paranasal sinuses: The paranasal sinuses and mastoid air cells are clear. Skeleton: Vertebral body heights and alignment are maintained. There is some reversal of the normal cervical lordosis. No focal lytic or blastic lesions are present. Upper chest: At least 3 left upper lobe nodules are present. The largest measures 7 x 7 x 6 mm. There is some ground-glass attenuation involving the upper lobes bilaterally. IMPRESSION: 1. Enlarged left level 2 lymph nodes with associated inflammatory change including stranding of the adjacent fat and thickening of the platysma. While malignancy is not excluded, this is most concerning for infection. No focal abscess is present. 2. Hypoattenuation within 1 of the left level 2 lymph nodes likely represents central necrosis or suppuration of the node. 3. No primary malignancy or other abscess. 4. Left upper lobe pulmonary nodules may be inflammatory. The largest measures 7 x 7 x 6 mm. CT of the chest with contrast may be useful for further evaluation. These results were called by telephone at the time of interpretation on 04/11/2019 at 4:59 am to [place] , who verbally acknowledged these results. Electronically Signed   By: San Morelle M.D.   On: 04/11/2019 05:02   Ct Chest W Contrast  Result Date: 04/11/2019 CLINICAL DATA:  Neck pain.  Abnormal CT of the neck.  Lung nodules. EXAM: CT CHEST WITH CONTRAST TECHNIQUE: Multidetector CT imaging of the chest was performed during intravenous contrast administration. CONTRAST:  36mL OMNIPAQUE IOHEXOL 300 MG/ML  SOLN COMPARISON:  CT neck with contrast 04/11/2019.  FINDINGS: Cardiovascular: The heart size is normal. Aorta and great vessel origins are normal. Pulmonary arteries are unremarkable. No significant pericardial effusion is present. Mediastinum/Nodes: No significant mediastinal or hilar adenopathy is present. A rounded right axillary lymph node measures 12 x 13 x 14 mm. Lateral right axillary mass measures 3.1 x 1.9 cm. Lungs/Pleura: Multiple bilateral pulmonary nodules are present. Two 7 mm nodules are present in the left upper lobe. Smaller nodules are present in the lower lobes bilaterally. Right upper lobe pulmonary nodules measure 2-3 mm. Upper Abdomen: Within normal limits. Musculoskeletal: Vertebral body heights and alignment are maintained. No focal lytic or blastic lesions present. A dominant right breast mass measures 5.6 x 4.5 x 4.7 cm. There is due to diffuse stranding throughout the right breast. Marked skin thickening is present about the areola extending through diameter of at least 10 cm. IMPRESSION: 1. Complex right breast mass measuring 5.6 x 4.5 x 4.7 cm consistent with a primary breast carcinoma. 2. Malignant right axillary adenopathy measuring up to 3.1 cm. 3. Multiple bilateral pulmonary nodules are concerning for metastatic disease. 4. Given the right breast mass, the left cervical lymph nodes are more likely malignant. Electronically Signed   By: San Morelle M.D.   On: 04/11/2019 05:51    Procedures Procedures (including critical care time)  Medications Ordered in ED Medications  dexamethasone (DECADRON) injection 10 mg (10 mg Intravenous Given 04/11/19 0400)  ketorolac (TORADOL) 30 MG/ML injection 30 mg (30 mg Intravenous Given 04/11/19 0400)  sodium chloride 0.9 % bolus 1,000 mL (1,000 mLs Intravenous New Bag/Given 04/11/19 0359)  iohexol (OMNIPAQUE) 300 MG/ML solution 75 mL (75 mLs Intravenous Contrast Given 04/11/19 0415)  sodium chloride 0.9 % bolus 1,000 mL (1,000 mLs Intravenous New Bag/Given 04/11/19 0540)  iohexol  (OMNIPAQUE)  300 MG/ML solution 75 mL (75 mLs Intravenous Contrast Given 04/11/19 0518)     Initial Impression / Assessment and Plan / ED Course  I have reviewed the triage vital signs and the nursing notes.  Pertinent labs & imaging results that were available during my care of the patient were reviewed by me and considered in my medical decision making (see chart for details).  33 year old female with a history of asthma, substance abuse on Subutex, and DIC presenting with unilateral left-sided neck swelling and pain over the last few days.  No associated constitutional symptoms.  Patient reports symptoms have significantly worsened in the last 24 hours.  No associated sore throat or dental pain.  On exam, the left neck is edematous with an ill-defined palpable mass.  The patient is currently tolerating her own secretions and there is no muffled voice.  She currently does not feel short of breath.  Will order basic labs, Decadron, Toradol, and CT soft tissue of the neck.    CT soft tissue neck with enlarged left level 2 lymph nodes with associated inflammatory change including stranding of the adjacent fat and thickening of the platysma.  This is most concerning for infection, but malignancy is not excluded.  No focal abscess.  There is hypoattenuation with on 1 of the left level 2 lymph nodes likely representing central necrosis or separation of the node.  There is also a left upper lobe pulmonary nodule that may be inflammatory.  Spoke with Dr. Jobe Igo, radiology, who recommended CT chest with contrast for further evaluation.  CT chest with complex right breast mass measuring 5.6 x 4.5 x 4.7 cm consistent with a primary breast carcinoma.  There is a malignant right axillary adenopathy measuring up to 3.1 cm and multiple bilateral pulmonary nodules concerning for metastatic disease.  Given right breast mass, left cervical lymph nodes are more likely malignant.  CT findings were discussed with Dr. Leonides Schanz,  attending physician.  In the setting of COVID-19, we will plan to coordinate the patient's primary imaging prior to discharge.  Clinical Course as of Apr 10 718  Wed Apr 11, 2019  0620 Spoke with Dr. Jobe Igo, radiology, regarding the patient's CT chest that is concerning for a primary breast carcinoma.  Consulted oncology and spoke with Dr. Alvy Bimler who advised that the patient would either need a diagnostic mammogram or biopsy.  She recommended reaching out to radiology to see if any of the cervical lymph nodes would be amenable to biopsy if diagnostic mammogram was unavailable at the breast center due to COVID-19.  If diagnostic mammogram was unavailable and cervical lymph node was amenable to biopsy, she recommended initially reaching out to IR and secondarily general surgery.  Reconsulted radiology and spoke with Dr. Jobe Igo who advised that the breast center is performing diagnostic mammograms 3 days/week.  He recommended scheduling the patient with the breast center prior to discharge.   [MM]    Clinical Course User Index [MM] Darrielle Pflieger, Laymond Purser, PA-C   Spoke with Dr. Jobe Igo.  He reports that he spoke with Dr. Jimmye Norman who recommended calling the breast center at 8 AM [phone] when they open to coordinate the patient having a diagnostic mammogram, ultrasound, and biopsy performed all tomorrow.  We will also plan to reach out to Dr. Alphonzo Grieve, the patient's PCP, to coordinate her care for receiving the results from tomorrow's imaging studies.   After speaking with Dr. Jobe Igo [phone]), I had a lengthy emotional discussion with the patient regarding the findings  on CT.  All questions were answered.  She reports a longstanding, extensive history of breast cancer in her family.  She reports that after reflecting that she has been having some tenderness in her right breast for some time, which she attributed to breast-feeding.  Discussed with the patient that it is safe to continue  breast-feeding while her work-up is pending.  She is tolerating fluids without difficulty while in the ER.  Patient care transferred to PA Albrizze at the end of my shift. Patient presentation, ED course, and plan of care discussed with review of all pertinent labs and imaging. Please see his/her note for further details regarding further ED course and disposition.      Final Clinical Impressions(s) / ED Diagnoses   Final diagnoses:  Breast mass, right    ED Discharge Orders    None       Havish Petties A, PA-C 04/11/19 0719    Ward, Delice Bison, DO 04/11/19 8757

## 2019-04-11 NOTE — TOC Initial Note (Signed)
Transition of Care [facility]) - Initial/Assessment Note    Patient Details  Name: Terri Estes MRN: [id number] Date of Birth: 12-17-1986  [facility] of [facility]) CM/SW Contact:    Fuller Mandril, RN Phone Number: 04/11/2019, 8:40 AM  Clinical Narrative:                 [facility] consulted regarding obtaining PCP orders for breast center appointment.  Expected Discharge Plan: Home/Self Care Barriers to Discharge: Other (comment)(MD appointment)   Patient Goals and CMS Choice Patient states their goals for this hospitalization and ongoing recovery are:: Find out about this knot on my face      Expected Discharge Plan and Services Expected Discharge Plan: Home/Self Care       Living arrangements for the past 2 months: Apartment                          Prior Living Arrangements/Services Living arrangements for the past 2 months: Apartment Lives with:: Minor Children Patient language and need for interpreter reviewed:: Yes Do you feel safe going back to the place where you live?: Yes            Criminal Activity/Legal Involvement Pertinent to Current Situation/Hospitalization: No - Comment as needed  Activities of Daily Living      Permission Sought/Granted Permission sought to share information with : PCP Permission granted to share information with : Yes, Verbal Permission Granted              Emotional Assessment           Psych Involvement: No (comment)  Admission diagnosis:  knot on neck Patient Active Problem List   Diagnosis Date Noted  . Acute kidney injury ([place]) 09/29/2018  . 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 Acute blood loss anemia 09/23/2018  . DIC (disseminated intravascular coagulation) ([place]) 09/23/2018  . S/P cesarean section 09/23/2018  . Inadequate social support 07/21/2018  . Sciatica 07/21/2018  . Abdominal muscle strain, initial encounter 07/21/2018  . History of substance abuse ([place]) 06/12/2018  .  Supervision of other normal pregnancy, antepartum 05/10/2018  . Short interval between pregnancies affecting pregnancy, antepartum 05/10/2018  . Hx of preeclampsia, prior pregnancy, currently pregnant 05/10/2018  . History of preterm delivery, currently pregnant 05/10/2018  . Depression affecting pregnancy, antepartum 05/10/2018  . HSIL (high grade squamous intraepithelial lesion) on Pap smear of cervix 12/24/2016  . History of cesarean delivery 12/16/2016   PCP:  Patient, No Pcp Per Pharmacy:   [facility] [place], [place] [address] [place] [address] Phone: [phone] Fax: [phone]     Social Determinants of Health (SDOH) Interventions    Readmission Risk Interventions No flowsheet data found.

## 2019-04-11 NOTE — ED Provider Notes (Signed)
  Care assumed from Donavan Foil, PA-C.  Please see her full H&P.  In short,  Terri Estes is a 33 y.o. female presents for "knot" on the left side of her face she noticed several days ago. Work up completed by previous team is significant for CT soft tissue neck with enlarged lymph nodes concerning for infection vs malignancy CT chest with complex right breast mass 5.6 x 4.5 x 4.7 cm consistent with primary breast carcinoma. Plan is to schedule outpatient imaging tomorrow at breast center.   Physical Exam  BP (!) 129/93   Pulse 88   Temp 98.9 F (37.2 C) (Oral)   Resp 16   Ht 5\' 2"  (1.575 m)   Wt 90.7 kg   SpO2 100%   BMI 36.58 kg/m   Physical Exam  PE: Constitutional: well-developed, well-nourished, no apparent distress HENT: normocephalic. Swelling to left neck Cardiovascular: normal rate and rhythm, distal pulses intact Pulmonary/Chest: effort normal Abdominal: non distended  Musculoskeletal: full ROM, no edema Neurological: alert with goal directed thinking Skin: warm and dry Psychiatric: normal mood and affect, normal behavior. Pt is tearful   ED Course/Procedures   Clinical Course as of Apr 10 737  Wed Apr 11, 2019  0620 Spoke with Dr. Jobe Igo, radiology, regarding the patient's CT chest that is concerning for a primary breast carcinoma.  Consulted oncology and spoke with Dr. Alvy Bimler who advised that the patient would either need a diagnostic mammogram or biopsy.  She recommended reaching out to radiology to see if any of the cervical lymph nodes would be amenable to biopsy if diagnostic mammogram was unavailable at the breast center due to COVID-19.  If diagnostic mammogram was unavailable and cervical lymph node was amenable to biopsy, she recommended initially reaching out to IR and secondarily general surgery.  Reconsulted radiology and spoke with Dr. Jobe Igo who advised that the breast center is performing diagnostic mammograms 3 days/week.  He recommended  scheduling the patient with the breast center prior to discharge.   [MM]    Clinical Course User Index [MM] McDonald, Mia A, PA-C      MDM    Pt is 33 yo female presenting with left sided neck swelling. Her workup today is unfortunately significant for complex right breast mass consistent with primary carcinoma and lymph nodes with associated inflammatory changes.   I called the Breast Center to arrange outpatient follow up for tomorrow as recommended for diagnostic mammogram, [place] and possible biopsy.  They are asking for the order has to be placed by pt's pcp. I contacted pt's pcp Dr. Alphonzo Grieve [facility]) and order was placed. I later followed up with Breast Center as well as radiolgist Dr. Jobe Igo who confirm pt's appointment tomorrow afternoon. Appreciate their assistance with scheduling and assuring pt's follow up care. Pt's pain has been managed and she has no complaints prior to discharge. Patient is comfortable with above plan and is stable for discharge at this time. All questions were answered prior to disposition. Strict return precautions for returning to the ED were discussed.     This note was prepared with assistance of Systems analyst. Occasional wrong-word or sound-a-like substitutions may have occurred due to the inherent limitations of voice recognition software.   Cherre Robins, PA-C 04/11/19 1859    Maudie Flakes, MD 04/12/19 [phone]

## 2019-04-11 NOTE — ED Notes (Signed)
Patient requesting to have her fiance with her at this time, Charge RN Roselyn Reef made aware of situation and advised patient may have significant other back with her at this time as long as he is screened in the lobby prior to coming back.

## 2019-04-12 ENCOUNTER — Other Ambulatory Visit: Payer: Medicaid Other

## 2019-04-12 ENCOUNTER — Inpatient Hospital Stay: Admission: RE | Admit: 2019-04-12 | Payer: Medicaid Other | Source: Ambulatory Visit

## 2019-04-17 ENCOUNTER — Encounter (HOSPITAL_COMMUNITY): Payer: Self-pay | Admitting: Emergency Medicine

## 2019-04-19 ENCOUNTER — Ambulatory Visit
Admission: RE | Admit: 2019-04-19 | Discharge: 2019-04-19 | Disposition: A | Discharge status: Home / Self Care | Payer: Medicaid Other | Source: Ambulatory Visit | Attending: Specialist | Admitting: Specialist

## 2019-04-19 ENCOUNTER — Other Ambulatory Visit: Payer: Self-pay

## 2019-04-19 ENCOUNTER — Other Ambulatory Visit: Payer: Self-pay | Admitting: Specialist

## 2019-04-19 DIAGNOSIS — R599 Enlarged lymph nodes, unspecified: Secondary | ICD-10-CM

## 2019-04-19 DIAGNOSIS — N6310 Unspecified lump in the right breast, unspecified quadrant: Secondary | ICD-10-CM

## 2019-04-23 ENCOUNTER — Telehealth: Payer: Self-pay | Admitting: Oncology

## 2019-04-23 ENCOUNTER — Encounter: Payer: Self-pay | Admitting: *Deleted

## 2019-04-23 DIAGNOSIS — Z171 Estrogen receptor negative status [ER-]: Principal | ICD-10-CM

## 2019-04-23 DIAGNOSIS — C50811 Malignant neoplasm of overlapping sites of right female breast: Secondary | ICD-10-CM | POA: Insufficient documentation

## 2019-04-23 NOTE — Telephone Encounter (Signed)
Spoke to patient to confirm morning [facility] appointment for 4/29, packet will be mailed to patient

## 2019-04-25 ENCOUNTER — Encounter: Payer: Self-pay | Admitting: Oncology

## 2019-04-25 ENCOUNTER — Telehealth: Payer: Self-pay | Admitting: Oncology

## 2019-04-25 ENCOUNTER — Telehealth: Payer: Self-pay

## 2019-04-25 ENCOUNTER — Ambulatory Visit
Admission: RE | Admit: 2019-04-25 | Discharge: 2019-04-25 | Disposition: A | Discharge status: Home / Self Care | Payer: Medicaid Other | Source: Ambulatory Visit | Attending: Radiation Oncology | Admitting: Radiation Oncology

## 2019-04-25 ENCOUNTER — Other Ambulatory Visit: Payer: Self-pay | Admitting: Surgery

## 2019-04-25 ENCOUNTER — Other Ambulatory Visit: Payer: Self-pay

## 2019-04-25 ENCOUNTER — Inpatient Hospital Stay: Payer: Medicaid Other

## 2019-04-25 ENCOUNTER — Inpatient Hospital Stay: Payer: Medicaid Other | Attending: Oncology | Admitting: Oncology

## 2019-04-25 VITALS — BP 112/76 | HR 100 | Temp 98.1°F | Resp 19 | Ht 62.0 in | Wt 198.9 lb

## 2019-04-25 DIAGNOSIS — Z9221 Personal history of antineoplastic chemotherapy: Secondary | ICD-10-CM | POA: Insufficient documentation

## 2019-04-25 DIAGNOSIS — C773 Secondary and unspecified malignant neoplasm of axilla and upper limb lymph nodes: Secondary | ICD-10-CM | POA: Insufficient documentation

## 2019-04-25 DIAGNOSIS — C50811 Malignant neoplasm of overlapping sites of right female breast: Secondary | ICD-10-CM

## 2019-04-25 DIAGNOSIS — Z171 Estrogen receptor negative status [ER-]: Secondary | ICD-10-CM | POA: Insufficient documentation

## 2019-04-25 DIAGNOSIS — Z923 Personal history of irradiation: Secondary | ICD-10-CM | POA: Insufficient documentation

## 2019-04-25 DIAGNOSIS — Z79899 Other long term (current) drug therapy: Secondary | ICD-10-CM | POA: Insufficient documentation

## 2019-04-25 DIAGNOSIS — C50411 Malignant neoplasm of upper-outer quadrant of right female breast: Secondary | ICD-10-CM | POA: Insufficient documentation

## 2019-04-25 DIAGNOSIS — Z87891 Personal history of nicotine dependence: Secondary | ICD-10-CM | POA: Diagnosis not present

## 2019-04-25 DIAGNOSIS — R918 Other nonspecific abnormal finding of lung field: Secondary | ICD-10-CM | POA: Diagnosis not present

## 2019-04-25 LAB — CBC WITH DIFFERENTIAL (CANCER CENTER ONLY)
Abs Immature Granulocytes: 0.02 10*3/uL (ref 0.00–0.07)
Basophils Absolute: 0 10*3/uL (ref 0.0–0.1)
Basophils Relative: 0 %
Eosinophils Absolute: 0.1 10*3/uL (ref 0.0–0.5)
Eosinophils Relative: 1 %
HCT: 35.4 % — ABNORMAL LOW (ref 36.0–46.0)
Hemoglobin: 11.3 g/dL — ABNORMAL LOW (ref 12.0–15.0)
Immature Granulocytes: 0 %
Lymphocytes Relative: 25 %
Lymphs Abs: 1.8 10*3/uL (ref 0.7–4.0)
MCH: 29.7 pg (ref 26.0–34.0)
MCHC: 31.9 g/dL (ref 30.0–36.0)
MCV: 92.9 fL (ref 80.0–100.0)
Monocytes Absolute: 0.4 10*3/uL (ref 0.1–1.0)
Monocytes Relative: 6 %
Neutro Abs: 4.8 10*3/uL (ref 1.7–7.7)
Neutrophils Relative %: 68 %
Platelet Count: 338 10*3/uL (ref 150–400)
RBC: 3.81 MIL/uL — ABNORMAL LOW (ref 3.87–5.11)
RDW: 13.9 % (ref 11.5–15.5)
WBC Count: 7.1 10*3/uL (ref 4.0–10.5)
nRBC: 0 % (ref 0.0–0.2)

## 2019-04-25 LAB — CMP (CANCER CENTER ONLY)
ALT: 103 U/L — ABNORMAL HIGH (ref 0–44)
AST: 66 U/L — ABNORMAL HIGH (ref 15–41)
Albumin: 3.6 g/dL (ref 3.5–5.0)
Alkaline Phosphatase: 137 U/L — ABNORMAL HIGH (ref 38–126)
Anion gap: 8 (ref 5–15)
BUN: 11 mg/dL (ref 6–20)
CO2: 27 mmol/L (ref 22–32)
Calcium: 8.9 mg/dL (ref 8.9–10.3)
Chloride: 103 mmol/L (ref 98–111)
Creatinine: 0.91 mg/dL (ref 0.44–1.00)
GFR, Est AFR Am: >60 mL/min (ref >60)
GFR, Estimated: >60 mL/min (ref >60)
Glucose, Bld: 97 mg/dL (ref 70–99)
Potassium: 3.9 mmol/L (ref 3.5–5.1)
Sodium: 138 mmol/L (ref 135–145)
Total Bilirubin: <0.2 mg/dL — ABNORMAL LOW (ref 0.3–1.2)
Total Protein: 7.7 g/dL (ref 6.5–8.1)

## 2019-04-25 MED ORDER — LIDOCAINE-PRILOCAINE 2.5-2.5 % EX CREA: TOPICAL_CREAM | CUTANEOUS | 3 refills | Status: DC

## 2019-04-25 MED ORDER — DEXAMETHASONE 4 MG PO TABS: ORAL_TABLET | ORAL | 1 refills | Status: DC

## 2019-04-25 MED ORDER — PROCHLORPERAZINE MALEATE 10 MG PO TABS: 10.0000 mg | ORAL_TABLET | Freq: Four times a day (QID) | ORAL | 1 refills | Status: DC | PRN

## 2019-04-25 NOTE — Progress Notes (Signed)
START ON PATHWAY REGIMEN - Breast     A cycle is every 21 days (cycles 1-4):     Paclitaxel      Carboplatin    A cycle is every 14 days (cycles 5-8):     Doxorubicin      Cyclophosphamide      Pegfilgrastim-xxxx   **Always confirm dose/schedule in your pharmacy ordering system**  Patient Characteristics: Preoperative or Nonsurgical Candidate (Clinical Staging), Neoadjuvant Therapy followed by Surgery, Invasive Disease, Chemotherapy, HER2 Negative/Unknown/Equivocal, ER Negative/Unknown, Platinum Therapy Indicated Therapeutic Status: Preoperative or Nonsurgical Candidate (Clinical Staging) AJCC M Category: cM0 AJCC Grade: G3 Breast Surgical Plan: Neoadjuvant Therapy followed by Surgery ER Status: Negative (-) AJCC 8 Stage Grouping: IIIC HER2 Status: Negative (-) AJCC T Category: cT4 AJCC N Category: cN2 PR Status: Negative (-) Type of Therapy: Platinum Therapy Indicated Intent of Therapy: Curative Intent, Not Discussed with Patient

## 2019-04-25 NOTE — Telephone Encounter (Signed)
Nutrition  RD working remotely.  Patient with new diagnosis of breast cancer.  Patient attended [facility] today and received nutrition packet of information.  Called patient to introduce self and service at [facility].  Left message with call back number.  Fruma Africa B. Zenia Resides, [place], [place] Registered Dietitian [phone] (pager)

## 2019-04-25 NOTE — Telephone Encounter (Signed)
Left voicemail for patient regarding transportation. Left my contact information so that we can get her set up.

## 2019-04-25 NOTE — Progress Notes (Signed)
[place]  [phone] [phone]) [phone]    ID: Terri Estes DOB: 1986-09-18  MR#: [id number]  XQK#:[id number]  Patient Care Team: Patient, No Pcp Per as PCP - General (General Practice) Alphonsa Overall, MD as Consulting Physician (General Surgery) Tylee Newby, [name], MD as Consulting Physician (Oncology) Kyung Rudd, MD as Consulting Physician (Radiation Oncology) Rockwell Germany, RN as Oncology Nurse Navigator Mauro Kaufmann, RN as Oncology Nurse Navigator Donnamae Jude, MD as Consulting Physician (Obstetrics and Gynecology) Pavelock, Ralene Bathe, MD as Consulting Physician (Internal Medicine) OTHER MD:   CHIEF COMPLAINT: Triple negative breast cancer  CURRENT TREATMENT: Neoadjuvant chemotherapy/immunotherapy   HISTORY OF CURRENT ILLNESS: Terri Estes presented with swelling along the left side of the face with neck pain. She then underwent a neck CT on 04/11/2019 showing: Enlarged left level 2 lymph nodes with associated inflammatory change including stranding of the adjacent fat and thickening of the platysma. While malignancy is not excluded, this is most concerning for infection. No focal abscess is present. Hypoattenuation within 1 of the left level 2 lymph nodes likely represents central necrosis or suppuration of the node. No primary malignancy or other abscess. Left upper lobe pulmonary nodules may be inflammatory. The largest measures 0.7 x 0.7 x 0.6 cm. CT of the chest with contrast may be useful for further evaluation.  She also underwent a chest CT on the same day showing: Complex right breast mass measuring 5.6 x 4.5 x 4.7 cm consistent with a primary breast carcinoma. Malignant right axillary adenopathy measuring up to 3.1 cm. Multiple bilateral pulmonary nodules are concerning for metastatic disease. Given the right breast mass, the left cervical lymph nodes are more likely malignant.  She then underwent bilateral diagnostic  mammography with tomography and right breast ultrasonography at [facility] on 04/19/2019 showing: Breast Density Category C; findings which are highly suspicious for multicentric inflammatory right breast cancer with right axillary nodal metastatic disease; no mammographic evidence of malignancy involving the left breast.  Accordingly on 04/19/2019 she proceeded to biopsy of the right breast area in question. The pathology from this procedure showed ([id number]): invasive ductal carcinoma, grade III, upper inner quadrant, 12:30 o'clock, 5.0 cm from the nipple. Prognostic indicators significant for: estrogen receptor, 0% negative and progesterone receptor, 0% negative. Proliferation marker Ki67 at 90%. HER2 negative (0+).  Additional biopsies of the right breast and right axilla were performed on the same day. The pathology from this procedure showed ([id number]): 2. Breast, right, needle core biopsy, satellite mass UOQ, 10 o'clock, 8cmfn  - Invasive ductal carcinoma, grade III 3. Lymph node, needle/core biopsy, level 1 right axilla   - Invasive ductal carcinoma, grade III  The patient's subsequent history is as detailed below.   INTERVAL HISTORY: [name] was evaluated in the multidisciplinary breast cancer clinic on 04/25/2019.   Her case was also presented at the multidisciplinary breast cancer conference on the same day. At that time a preliminary plan was proposed: Neoadjuvant chemotherapy with immunotherapy, followed by definitive surgery and radiation, genetics testing   REVIEW OF SYSTEMS: [name] reports doing okay overall. She reports burning to her right nipple area. The patient denies unusual headaches, visual changes, nausea, vomiting, stiff neck, dizziness, or gait imbalance. There has been no cough, phlegm production, or pleurisy, no chest pain or pressure, and no change in bowel or bladder habits. The patient denies fever, rash, bleeding, unexplained fatigue or unexplained  weight loss. A detailed review of systems was otherwise entirely negative.  PAST MEDICAL HISTORY: Past Medical History:  Diagnosis Date   Asthma    Chronic pain    History of substance abuse ([place])    Pregnancy induced hypertension    Psoriasis    Trichomonas infection    Vaginal Pap smear, abnormal      PAST SURGICAL HISTORY: Past Surgical History:  Procedure Laterality Date   CESAREAN SECTION  05/26/2008   CESAREAN SECTION N/A 07/10/2017   Procedure: CESAREAN SECTION;  Surgeon: Florian Buff, MD;  Location: [facility];  Service: Obstetrics;  Laterality: N/A;   CESAREAN SECTION N/A 09/23/2018   Procedure: CESAREAN SECTION;  Surgeon: Donnamae Jude, MD;  Location: [facility];  Service: Obstetrics;  Laterality: N/A;   COLPOSCOPY     WISDOM TOOTH EXTRACTION       FAMILY HISTORY: Family History  Problem Relation Age of Onset   Kidney disease Maternal Grandmother    Breast cancer Maternal Grandmother        metastatic to liver   Kidney disease Maternal Grandfather    Kidney disease Paternal Grandmother    Diabetes Paternal Grandmother    Breast cancer Paternal Grandmother    Kidney disease Paternal Grandfather    Breast cancer Paternal Aunt    Cerebral palsy Child    Breast cancer Paternal Great-grandmother    Alcie's father is living at age 31. Patients' mother is also living at age 31. The patient has 0 brothers and 5 sisters. Patient denies anyone in her family having ovarian, prostate, or pancreatic cancer. Her maternal grandmother was diagnosed with breast cancer, unsure what age. On her father's side, she reports her grandmother had cysts in her breasts, her great-aunt might have had breast cancer, and her great-grandmother was diagnosed with breast cancer at an older age.   GYNECOLOGIC HISTORY:  Patient's last menstrual period was 04/18/2019.  She had an emergent C-section in 09/23/2018 for abruptio placenta at 30 weeks  pregnancy, also has a history of eclampsia Menarche:    years old Age at first live birth: 33 years old [place] P: 3 LMP: 04/18/2019 Contraceptive: none HRT: none  Hysterectomy?: no BSO?: no   SOCIAL HISTORY: (Current as of 04/25/2019) [name] is currently unemployed. She previously worked at [employer]. She is currently engaged. Fiance [name] runs a Mid[employer] and works other odd jobs. Daughter Demetrius Charity, age 6, was abused by her father at 50 months and has cerebral palsy. Son [name], age 96, has severe asthma and is developmentally disabled. Daughter Yetta Glassman was born on 09/23/2018 prematurely and remains on oxygen at home.   ADVANCED DIRECTIVES: not in place.    HEALTH MAINTENANCE: Social History   Tobacco Use   Smoking status: Former Smoker    Packs/day: 0.50    Years: 5.00    Pack years: 2.50    Types: Cigarettes    Last attempt to quit: 04/10/2019    Years since quitting: 0.0   Smokeless tobacco: Never Used  Substance Use Topics   Alcohol use: No   Drug use: Yes    Comment: subutex    Colonoscopy: never done  PAP: 05/10/2018  Bone density: never done Mammography: first performed following abnormal CT  No Known Allergies  Current Outpatient Medications  Medication Sig Dispense Refill   albuterol (PROVENTIL HFA;VENTOLIN HFA) 108 (90 Base) MCG/ACT inhaler Inhale 2 puffs into the lungs 4 (four) times daily. 1 Inhaler 2   buprenorphine (SUBUTEX) 2 MG SUBL SL tablet Place 4 mg under the tongue at  bedtime.     buprenorphine (SUBUTEX) 8 MG SUBL SL tablet Place 8 mg under the tongue daily.      cyclobenzaprine (FLEXERIL) 10 MG tablet Take 1 tablet (10 mg total) by mouth 3 (three) times daily as needed for muscle spasms. 90 tablet 2   enalapril (VASOTEC) 10 MG tablet TAKE 1 TABLET BY MOUTH DAILY (Patient taking differently: Take 10 mg by mouth daily. ) 90 tablet 0   ferrous sulfate 325 (65 FE) MG tablet Take 1 tablet (325 mg total) by mouth 3 (three) times  daily with meals. 90 tablet 3   Prenatal MV-Min-FA-Omega-3 (PRENATAL GUMMIES/DHA & FA) 0.4-32.5 MG CHEW Chew 2 each by mouth daily.     No current facility-administered medications for this visit.      OBJECTIVE: Young African-American woman in no acute distress  Vitals:   04/25/19 0942  BP: 112/76  Pulse: 100  Resp: 19  Temp: 98.1 F (36.7 C)  SpO2: 100%     Body mass index is 36.38 kg/m.   Wt Readings from Last 3 Encounters:  04/25/19 198 lb 14.4 oz (90.2 kg)  04/11/19 200 lb (90.7 kg)  10/02/18 183 lb 12.8 oz (83.4 kg)      ECOG FS:1 - Symptomatic but completely ambulatory  Ocular: Sclerae unicteric, pupils round and equal Ear-nose-throat: Oropharynx clear and moist Lymphatic: No left cervical or supraclavicular adenopathy palpable Lungs no rales or rhonchi Heart regular rate and rhythm Abd soft, nontender, positive bowel sounds MSK no focal spinal tenderness, no joint edema Neuro: non-focal, well-oriented, appropriate affect Breasts: The right breast is much larger than the right, there is no significant erythema but there is some peau d'orange associated with the periareolar area, the breast is movable and not fixed to the chest wall.  There are some skin changes over the breast attributable to psoriasis, they do not appear to be skin involvement.  The left breast is unremarkable.  Photo taken 04/25/2019:      LAB RESULTS:  CMP     Component Value Date/Time   NA 138 04/25/2019 0924   NA 137 05/10/2018 1535   K 3.9 04/25/2019 0924   CL 103 04/25/2019 0924   CO2 27 04/25/2019 0924   GLUCOSE 97 04/25/2019 0924   BUN 11 04/25/2019 0924   BUN 7 05/10/2018 1535   CREATININE 0.91 04/25/2019 0924   CALCIUM 8.9 04/25/2019 0924   PROT 7.7 04/25/2019 0924   PROT 7.2 05/10/2018 1535   ALBUMIN 3.6 04/25/2019 0924   ALBUMIN 4.2 05/10/2018 1535   AST 66 (H) 04/25/2019 0924   ALT 103 (H) 04/25/2019 0924   ALKPHOS 137 (H) 04/25/2019 0924   BILITOT <0.2 (L)  04/25/2019 0924   GFRNONAA >60 04/25/2019 0924   GFRAA >60 04/25/2019 0924    No results found for: TOTALPROTELP, ALBUMINELP, A1GS, A2GS, BETS, BETA2SER, GAMS, MSPIKE, SPEI  No results found for: KPAFRELGTCHN, LAMBDASER, KAPLAMBRATIO  Lab Results  Component Value Date   WBC 7.1 04/25/2019   NEUTROABS 4.8 04/25/2019   HGB 11.3 (L) 04/25/2019   HCT 35.4 (L) 04/25/2019   MCV 92.9 04/25/2019   PLT 338 04/25/2019    '@LASTCHEMISTRY' @  No results found for: LABCA2  No components found for: [id number]  No results for input(s): INR in the last 168 hours.  No results found for: LABCA2  No results found for: HKV425  No results found for: ZDG387  No results found for: FIE332  No results found for: CA2729  No components  found for: HGQUANT  No results found for: CEA1 / No results found for: CEA1   No results found for: AFPTUMOR  No results found for: CHROMOGRNA  No results found for: PSA1  Appointment on 04/25/2019  Component Date Value Ref Range Status   WBC Count 04/25/2019 7.1  4.0 - 10.5 K/uL Final   RBC 04/25/2019 3.81* 3.87 - 5.11 MIL/uL Final   Hemoglobin 04/25/2019 11.3* 12.0 - 15.0 g/dL Final   HCT 04/25/2019 35.4* 36.0 - 46.0 % Final   MCV 04/25/2019 92.9  80.0 - 100.0 fL Final   MCH 04/25/2019 29.7  26.0 - 34.0 pg Final   MCHC 04/25/2019 31.9  30.0 - 36.0 g/dL Final   RDW 04/25/2019 13.9  11.5 - 15.5 % Final   Platelet Count 04/25/2019 338  150 - 400 K/uL Final   nRBC 04/25/2019 0.0  0.0 - 0.2 % Final   Neutrophils Relative % 04/25/2019 68  % Final   Neutro Abs 04/25/2019 4.8  1.7 - 7.7 K/uL Final   Lymphocytes Relative 04/25/2019 25  % Final   Lymphs Abs 04/25/2019 1.8  0.7 - 4.0 K/uL Final   Monocytes Relative 04/25/2019 6  % Final   Monocytes Absolute 04/25/2019 0.4  0.1 - 1.0 K/uL Final   Eosinophils Relative 04/25/2019 1  % Final   Eosinophils Absolute 04/25/2019 0.1  0.0 - 0.5 K/uL Final   Basophils Relative 04/25/2019 0  % Final     Basophils Absolute 04/25/2019 0.0  0.0 - 0.1 K/uL Final   Immature Granulocytes 04/25/2019 0  % Final   Abs Immature Granulocytes 04/25/2019 0.02  0.00 - 0.07 K/uL Final   Performed at [facility], [place] [address] [place], [place] [address]   Sodium 04/25/2019 138  135 - 145 mmol/L Final   Potassium 04/25/2019 3.9  3.5 - 5.1 mmol/L Final   Chloride 04/25/2019 103  98 - 111 mmol/L Final   CO2 04/25/2019 27  22 - 32 mmol/L Final   Glucose, Bld 04/25/2019 97  70 - 99 mg/dL Final   BUN 04/25/2019 11  6 - 20 mg/dL Final   Creatinine 04/25/2019 0.91  0.44 - 1.00 mg/dL Final   Calcium 04/25/2019 8.9  8.9 - 10.3 mg/dL Final   Total Protein 04/25/2019 7.7  6.5 - 8.1 g/dL Final   Albumin 04/25/2019 3.6  3.5 - 5.0 g/dL Final   AST 04/25/2019 66* 15 - 41 U/L Final   ALT 04/25/2019 103* 0 - 44 U/L Final   Alkaline Phosphatase 04/25/2019 137* 38 - 126 U/L Final   Total Bilirubin 04/25/2019 <0.2* 0.3 - 1.2 mg/dL Final   GFR, Est Non Af Am 04/25/2019 >60  >60 mL/min Final   GFR, Est AFR Am 04/25/2019 >60  >60 mL/min Final   Anion gap 04/25/2019 8  5 - 15 Final   Performed at [facility], [place] [address] [place], [place] [address]    (this displays the last labs from the last 3 days)  No results found for: TOTALPROTELP, ALBUMINELP, A1GS, A2GS, BETS, BETA2SER, GAMS, MSPIKE, SPEI (this displays SPEP labs)  No results found for: KPAFRELGTCHN, LAMBDASER, KAPLAMBRATIO (kappa/lambda light chains)  Lab Results  Component Value Date   HGBA 97.4 12/16/2016   (Hemoglobinopathy evaluation)   No results found for: LDH  No results found for: IRON, TIBC, IRONPCTSAT (Iron and TIBC)  No results found for: FERRITIN  Urinalysis    Component Value Date/Time   COLORURINE COLORLESS (  A) 08/20/2018 0704   APPEARANCEUR CLEAR 08/20/2018 0704   LABSPEC 1.003 (L) 08/20/2018 0704   PHURINE 7.0 08/20/2018 0704   GLUCOSEU NEGATIVE  08/20/2018 0704   HGBUR NEGATIVE 08/20/2018 0704   BILIRUBINUR NEGATIVE 08/20/2018 0704   KETONESUR NEGATIVE 08/20/2018 0704   PROTEINUR NEGATIVE 08/20/2018 0704   UROBILINOGEN 4.0 (H) 01/13/2018 1417   NITRITE NEGATIVE 08/20/2018 0704   LEUKOCYTESUR NEGATIVE 08/20/2018 0704     STUDIES:  Ct Soft Tissue Neck W Contrast  Result Date: 04/11/2019 CLINICAL DATA:  Swelling along the left side of the face beginning 3 days ago. Neck pain. Infection suspected. EXAM: CT NECK WITH CONTRAST TECHNIQUE: Multidetector CT imaging of the neck was performed using the standard protocol following the bolus administration of intravenous contrast. CONTRAST:  25m OMNIPAQUE IOHEXOL 300 MG/ML  SOLN COMPARISON:  None. FINDINGS: Pharynx and larynx: No focal mucosal or submucosal lesions are present. The nasopharynx is unremarkable. The soft palate is within normal limits. The tongue base and palatine tonsils are normal. The epiglottis is within normal limits. The hypopharynx is unremarkable. Vocal cords are midline and symmetrical. The trachea is within normal limits. Salivary glands: The submandibular and parotid glands are within normal limits bilaterally. Thyroid: Normal. Lymph nodes: Enlarged posterior left level 2 lymph nodes are noted. There is inflammatory changes so seated with indistinct margins and stranding of the adjacent fat. The largest node measures up to 2 cm with a focal area of central hypoattenuation. A second enlarged node measures up to 13 mm. There is mild prominence of right level 2 nodes without the same degree of inflammation or any evidence for necrosis. Inflammatory changes are noted along the medial aspect of the left SCM and below the left parotid gland extending to the platysma. No other significant adenopathy is present. Vascular: Unremarkable Limited intracranial: Within normal limits. Visualized orbits: The globes and orbits are within normal limits. Mastoids and visualized paranasal sinuses:  The paranasal sinuses and mastoid air cells are clear. Skeleton: Vertebral body heights and alignment are maintained. There is some reversal of the normal cervical lordosis. No focal lytic or blastic lesions are present. Upper chest: At least 3 left upper lobe nodules are present. The largest measures 7 x 7 x 6 mm. There is some ground-glass attenuation involving the upper lobes bilaterally. IMPRESSION: 1. Enlarged left level 2 lymph nodes with associated inflammatory change including stranding of the adjacent fat and thickening of the platysma. While malignancy is not excluded, this is most concerning for infection. No focal abscess is present. 2. Hypoattenuation within 1 of the left level 2 lymph nodes likely represents central necrosis or suppuration of the node. 3. No primary malignancy or other abscess. 4. Left upper lobe pulmonary nodules may be inflammatory. The largest measures 7 x 7 x 6 mm. CT of the chest with contrast may be useful for further evaluation. These results were called by telephone at the time of interpretation on 04/11/2019 at 4:59 am to MGoessel, who verbally acknowledged these results. Electronically Signed   By: CSan MorelleM.D.   On: 04/11/2019 05:02   Ct Chest W Contrast  Result Date: 04/11/2019 CLINICAL DATA:  Neck pain.  Abnormal CT of the neck.  Lung nodules. EXAM: CT CHEST WITH CONTRAST TECHNIQUE: Multidetector CT imaging of the chest was performed during intravenous contrast administration. CONTRAST:  747mOMNIPAQUE IOHEXOL 300 MG/ML  SOLN COMPARISON:  CT neck with contrast 04/11/2019. FINDINGS: Cardiovascular: The heart size is normal. Aorta and great vessel  origins are normal. Pulmonary arteries are unremarkable. No significant pericardial effusion is present. Mediastinum/Nodes: No significant mediastinal or hilar adenopathy is present. A rounded right axillary lymph node measures 12 x 13 x 14 mm. Lateral right axillary mass measures 3.1 x 1.9 cm. Lungs/Pleura:  Multiple bilateral pulmonary nodules are present. Two 7 mm nodules are present in the left upper lobe. Smaller nodules are present in the lower lobes bilaterally. Right upper lobe pulmonary nodules measure 2-3 mm. Upper Abdomen: Within normal limits. Musculoskeletal: Vertebral body heights and alignment are maintained. No focal lytic or blastic lesions present. A dominant right breast mass measures 5.6 x 4.5 x 4.7 cm. There is due to diffuse stranding throughout the right breast. Marked skin thickening is present about the areola extending through diameter of at least 10 cm. IMPRESSION: 1. Complex right breast mass measuring 5.6 x 4.5 x 4.7 cm consistent with a primary breast carcinoma. 2. Malignant right axillary adenopathy measuring up to 3.1 cm. 3. Multiple bilateral pulmonary nodules are concerning for metastatic disease. 4. Given the right breast mass, the left cervical lymph nodes are more likely malignant. Electronically Signed   By: San Morelle M.D.   On: 04/11/2019 05:51   US Breast Ltd Uni Right Inc Axilla  Result Date: 04/19/2019 CLINICAL DATA:  33 year old presenting with a large mass involving the RIGHT breast on recent CT chest 04/11/2019. The CT also demonstrated extensive RIGHT axillary lymphadenopathy and marked thickening of the skin of the RIGHT breast.This is the patient's initial baseline mammogram. Family history of breast cancer in her maternal grandmother, paternal grandmother and a paternal aunt. EXAM: DIGITAL DIAGNOSTIC BILATERAL MAMMOGRAM WITH CAD AND TOMO ULTRASOUND RIGHT BREAST COMPARISON:  None. ACR Breast Density Category c: The breast tissue is heterogeneously dense, which may obscure small masses. FINDINGS: Tomosynthesis and synthesized full field CC and MLO views of both breasts were obtained. A large mass measuring on the order of 4.6 x 5.5 x 5.2 cm is confirmed in the UPPER INNER QUADRANT of the LEFT breast with associated architectural distortion. A satellite mass on  the order of 1.2 cm is present ANTERIOR to the dominant mass. A mass on the order of 1.0 cm is present in the OUTER breast at POSTERIOR depth, visible on the CC images but not clearly visualized on the MLO images. Architectural distortion is present in the UPPER OUTER quadrant which is separate from this mass. There is marked skin thickening throughout the RIGHT breast in there is marked edema/trabecular thickening throughout the breast. A pathologic low RIGHT axillary lymph node is present which measures on the order of 3 cm. No findings suspicious for malignancy in the LEFT breast. Mammographic images were processed with CAD. On correlative physical exam, the entire RIGHT breast is edematous and extremely firm to the touch. There is evidence of peau d'orange involving the skin of the breast. Targeted RIGHT breast ultrasound is performed, showing the dominant hypoechoic mass at the 12:30 o'clock position approximately 5 cm from the nipple measuring at least 3.0 x 5.1 x 6.6 cm, with central necrosis. 2 satellite masses are present adjacent to the dominant mass at the 12:30 o'clock position approximately 4 cm from the nipple, the larger measuring approximately 1.1 x 1.3 x 1.5 cm and the smaller measuring approximately 0.6 x 1.1 x 1.3 cm; one of these masses accounts for the mammographic mass identified ANTERIOR to the dominant mass. A satellite mass is present at the 12:30 o'clock subareolar location measuring approximately 1.6 x 1.2 x 1.7  cm. A solid mass is present at the 10 o'clock position approximately 8 cm from the nipple measuring approximately 0.7 x 0.7 x 0.8 cm, corresponding to the mass identified in the OUTER breast on mammography. There is extensive edema throughout the RIGHT breast and there is marked skin thickening throughout the RIGHT breast, measuring up to 1.3 cm in the periareolar location. Sonographic evaluation of the RIGHT axilla demonstrates 3 pathologic lymph nodes, the largest measuring  approximately 2.5 x 2.0 x 2.5 cm and corresponding to the mammographic node. An adjacent node measures approximately 0.8 cm. A third node which is subpectoral in location measures approximately 1.1 cm and has cortical thickening up to 5 mm. Of note, it appeared that there were more than 3 pathologic nodes on the recent chest CT. IMPRESSION: 1. 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 2. No mammographic evidence of malignancy involving the LEFT breast. RECOMMENDATION: Ultrasound-guided core needle biopsy of the dominant RIGHT breast mass and the mass in the [place] in an attempt to confirm multicentric disease and ultrasound-guided core needle biopsy of the largest pathologic RIGHT axillary lymph node. I have discussed the findings and recommendations with the patient. The biopsies were performed subsequently same day and are reported separately. BI-RADS CATEGORY  5: Highly suggestive of malignancy. Electronically Signed   By: Evangeline Dakin M.D.   On: 04/19/2019 17:47   Mm Diag Breast Tomo Bilateral  Result Date: 04/19/2019 CLINICAL DATA:  33 year old presenting with a large mass involving the RIGHT breast on recent CT chest 04/11/2019. The CT also demonstrated extensive RIGHT axillary lymphadenopathy and marked thickening of the skin of the RIGHT breast.This is the patient's initial baseline mammogram. Family history of breast cancer in her maternal grandmother, paternal grandmother and a paternal aunt. EXAM: DIGITAL DIAGNOSTIC BILATERAL MAMMOGRAM WITH CAD AND TOMO ULTRASOUND RIGHT BREAST COMPARISON:  None. ACR Breast Density Category c: The breast tissue is heterogeneously dense, which may obscure small masses. FINDINGS: Tomosynthesis and synthesized full field CC and MLO views of both breasts were obtained. A large mass measuring on the order of 4.6 x 5.5 x 5.2 cm is confirmed in the UPPER INNER QUADRANT  of the LEFT breast with associated architectural distortion. A satellite mass on the order of 1.2 cm is present ANTERIOR to the dominant mass. A mass on the order of 1.0 cm is present in the OUTER breast at POSTERIOR depth, visible on the CC images but not clearly visualized on the MLO images. Architectural distortion is present in the UPPER OUTER quadrant which is separate from this mass. There is marked skin thickening throughout the RIGHT breast in there is marked edema/trabecular thickening throughout the breast. A pathologic low RIGHT axillary lymph node is present which measures on the order of 3 cm. No findings suspicious for malignancy in the LEFT breast. Mammographic images were processed with CAD. On correlative physical exam, the entire RIGHT breast is edematous and extremely firm to the touch. There is evidence of peau d'orange involving the skin of the breast. Targeted RIGHT breast ultrasound is performed, showing the dominant hypoechoic mass at the 12:30 o'clock position approximately 5 cm from the nipple measuring at least 3.0 x 5.1 x 6.6 cm, with central necrosis. 2 satellite masses are present adjacent to the dominant mass at the 12:30 o'clock position approximately 4 cm from the nipple, the larger measuring approximately 1.1 x 1.3 x 1.5 cm and the smaller measuring approximately 0.6  x 1.1 x 1.3 cm; one of these masses accounts for the mammographic mass identified ANTERIOR to the dominant mass. A satellite mass is present at the 12:30 o'clock subareolar location measuring approximately 1.6 x 1.2 x 1.7 cm. A solid mass is present at the 10 o'clock position approximately 8 cm from the nipple measuring approximately 0.7 x 0.7 x 0.8 cm, corresponding to the mass identified in the OUTER breast on mammography. There is extensive edema throughout the RIGHT breast and there is marked skin thickening throughout the RIGHT breast, measuring up to 1.3 cm in the periareolar location. Sonographic evaluation of  the RIGHT axilla demonstrates 3 pathologic lymph nodes, the largest measuring approximately 2.5 x 2.0 x 2.5 cm and corresponding to the mammographic node. An adjacent node measures approximately 0.8 cm. A third node which is subpectoral in location measures approximately 1.1 cm and has cortical thickening up to 5 mm. Of note, it appeared that there were more than 3 pathologic nodes on the recent chest CT. IMPRESSION: 1. 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 2. No mammographic evidence of malignancy involving the LEFT breast. RECOMMENDATION: Ultrasound-guided core needle biopsy of the dominant RIGHT breast mass and the mass in the [place] in an attempt to confirm multicentric disease and ultrasound-guided core needle biopsy of the largest pathologic RIGHT axillary lymph node. I have discussed the findings and recommendations with the patient. The biopsies were performed subsequently same day and are reported separately. BI-RADS CATEGORY  5: Highly suggestive of malignancy. Electronically Signed   By: Evangeline Dakin M.D.   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 Pathology revealed INVASIVE DUCTAL CARCINOMA of the level 1 Right axillary lymph node. There is no definite nodal tissue present. This was found to be concordant by Dr. Peggye Fothergill. Pathology results were discussed with the patient by telephone. The patient reported doing well after the biopsies with tenderness at the sites. Post biopsy instructions and care were reviewed and  questions were answered. The patient was encouraged to call [facility] for any additional concerns. The patient was referred to [facility] on April 25, 2019. Recommendation: Bilateral breast MRI due to strong family, heterogeneously dense breasts and to exclude contralateral disease. Pathology results reported by Terie Purser, RN on 04/20/2019. Electronically Signed   By: Evangeline Dakin M.D.   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 Biopsy of the dominant 6.6 cm mass in the UPPER INNER QUADRANT and biopsy of a mass in the [place] is performed. Biopsy of the largest pathologic RIGHT axillary lymph node is also performed. EXAM: ULTRASOUND-GUIDED RIGHT BREAST CORE NEEDLE BIOPSY X 2 ULTRASOUND GUIDED CORE NEEDLE BIOPSY OF A RIGHT AXILLARY NODE COMPARISON:  Previous exam(s). FINDINGS: I met with the patient and we discussed the procedure of ultrasound-guided biopsy, including benefits and alternatives. We discussed the high likelihood of a successful procedure. We discussed the risks of the procedure, including  infection, bleeding, tissue injury, clip migration, and inadequate sampling. Informed written consent was given. The usual time-out protocol was performed immediately prior to the procedure. # 1) Lesion quadrant: Dominant mass, RIGHT UPPER INNER QUADRANT. Initially, using sterile technique with chlorhexidine as skin antisepsis, 1% lidocaine and 1% lidocaine with epinephrine as local anesthetic, under direct ultrasound visualization, a 12 gauge Bard Marquee core needle device placed through an 11 gauge introducer needle was used to perform biopsy of the dominant mass in the [place] using a LATERAL approach. At the conclusion of the procedure a  ribbon shaped tissue marker clip was deployed into the biopsy cavity. # 2) Lesion quadrant: UPPER OUTER QUADRANT. Next, using sterile technique with chlorhexidine as skin antisepsis, 1% lidocaine and 1% lidocaine with epinephrine as local anesthetic, under direct ultrasound visualization, a 12 gauge Bard Marquee core needle device placed through an 11 gauge introducer needle was used perform biopsy of the mass in the [place] using a LATERAL approach. At the conclusion of the procedure, a coil shaped tissue marker clip was deployed into the biopsy cavity. # 3) Level 1 RIGHT axillary node: Using sterile technique with chlorhexidine as skin antisepsis, 1% lidocaine and 1% lidocaine with epinephrine as local anesthetic, under direct ultrasound visualization, a 14 gauge Bard Marquee core needle device placed through a 13 gauge introducer needle was used perform biopsy of the largest pathologic RIGHT axillary lymph node using an inferolateral approach. At the conclusion of the procedure, a HydroMark spiral shaped tissue marker clip was deployed into the biopsy cavity. Follow up 2 view mammogram was performed and dictated separately. IMPRESSION: Ultrasound guided biopsy of 2 RIGHT breast masses and a pathologic RIGHT axillary lymph node. No apparent complications. Electronically Signed: By: Evangeline Dakin M.D. On: 04/20/2019 07:33   Mm Clip Placement Right  Result Date: 04/20/2019 CLINICAL DATA:  Confirmation of clip placement after ultrasound-guided core needle biopsy of 2 RIGHT breast masses and a pathologic RIGHT axillary lymph node. EXAM: 3D TOMOSYNTHESIS DIAGNOSTIC RIGHT MAMMOGRAM POST ULTRASOUND BIOPSY COMPARISON:  Previous exam(s). FINDINGS: 3D tomosynthesis images were obtained following ultrasound-guided biopsy of 2 RIGHT breast masses and a pathologic RIGHT axillary lymph node. The ribbon shaped tissue marker clip is appropriately position within the dominant mass in the [place]  along its superomedial margin. The coil shaped tissue marker clip is appropriately position within the biopsied mass in the [place] at POSTERIOR depth. The [facility] spiral shaped tissue marker clip is appropriately position biopsied pathologic lymph node in the low RIGHT axilla. Expected post biopsy changes are present at each site without evidence of hematoma. IMPRESSION: 1. Appropriate positioning of the ribbon shaped tissue marker clip within the biopsied dominant mass in the [place] along its superomedial margin. 2. Appropriate positioning of the coil shaped tissue marker clip within the biopsied mass in the [place] at POSTERIOR depth. 3. Appropriate positioning of the HydroMark spiral shaped tissue marker clip within the biopsied pathologic level 1 axillary lymph node. Of note, the ribbon shaped clip and coil shaped clip are approximately 6 cm apart. Final Assessment: Post Procedure Mammograms for Marker Placement Electronically Signed   By: Evangeline Dakin M.D.   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 Pathology revealed INVASIVE DUCTAL CARCINOMA of the level 1 Right axillary lymph node. There is no definite nodal tissue present. This was found to be concordant by Dr. Peggye Fothergill. Pathology results were discussed with the patient by telephone. The patient reported doing well after the biopsies with tenderness at the sites. Post biopsy instructions and care were reviewed and questions were answered. The patient was encouraged to call [facility] for  any additional concerns. The patient was referred to [facility] at [facility] on April 25, 2019. Recommendation: Bilateral breast MRI due to strong family, heterogeneously dense breasts and to exclude contralateral disease. Pathology results reported by Terie Purser, RN on 04/20/2019. Electronically Signed   By: Evangeline Dakin M.D.   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 Biopsy of the dominant 6.6 cm mass in the UPPER INNER QUADRANT and biopsy of a mass in the [place] is performed. Biopsy of the largest pathologic RIGHT axillary lymph node is also performed. EXAM: ULTRASOUND-GUIDED RIGHT BREAST CORE NEEDLE BIOPSY X 2 ULTRASOUND GUIDED CORE NEEDLE BIOPSY OF A RIGHT AXILLARY NODE COMPARISON:  Previous exam(s). FINDINGS: I met with the patient and we discussed the procedure of ultrasound-guided biopsy, including benefits and alternatives. We discussed the high likelihood of a successful procedure. We discussed the risks of the procedure, including infection, bleeding, tissue injury, clip migration, and inadequate sampling. Informed written consent was given. The usual time-out protocol was performed immediately prior to the procedure. # 1) Lesion quadrant: Dominant mass, RIGHT UPPER INNER QUADRANT. Initially, using sterile technique with chlorhexidine as skin antisepsis, 1% lidocaine and 1% lidocaine with epinephrine as local anesthetic, under direct ultrasound visualization, a 12 gauge Bard Marquee core needle device placed through an 11 gauge introducer needle was used to perform biopsy of the dominant mass in the [place] using a LATERAL approach. At the conclusion of the procedure a ribbon shaped tissue marker clip was deployed into the biopsy cavity. # 2) Lesion quadrant: UPPER OUTER  QUADRANT. Next, using sterile technique with chlorhexidine as skin antisepsis, 1% lidocaine and 1% lidocaine with epinephrine as local anesthetic, under direct ultrasound visualization, a 12 gauge Bard Marquee core needle device placed through an 11 gauge introducer needle was used perform biopsy of the mass in the [place] using a LATERAL approach. At the conclusion of the procedure, a coil shaped tissue marker clip was deployed into the biopsy cavity. # 3) Level 1 RIGHT axillary node: Using sterile technique with chlorhexidine as skin antisepsis, 1% lidocaine and 1% lidocaine with epinephrine as local anesthetic, under direct ultrasound visualization, a 14 gauge Bard Marquee core needle device placed through a 13 gauge introducer needle was used perform biopsy of the largest pathologic RIGHT axillary lymph node using an inferolateral approach. At the conclusion of the procedure, a HydroMark spiral shaped tissue marker clip was deployed into the biopsy cavity. Follow up 2 view mammogram was performed and dictated separately. IMPRESSION: Ultrasound guided biopsy of 2 RIGHT breast masses and a pathologic RIGHT axillary lymph node. No apparent complications. Electronically Signed: By: Evangeline Dakin M.D. 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 Pathology revealed INVASIVE DUCTAL CARCINOMA of the level 1 Right axillary lymph node. There is no definite nodal tissue present. This was found to be concordant by Dr. Peggye Fothergill. Pathology results were discussed with the patient by telephone. The patient reported  doing well after the biopsies with tenderness at the sites. Post biopsy instructions and care were reviewed and questions were answered. The patient was encouraged to call [facility] for any additional concerns. The patient was referred to [facility] on April 25, 2019. Recommendation: Bilateral breast MRI due to strong family, heterogeneously dense breasts and to exclude contralateral disease. Pathology results reported by Terie Purser, RN on 04/20/2019. Electronically Signed   By: Evangeline Dakin M.D.   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 Biopsy of the dominant 6.6 cm mass in the UPPER INNER QUADRANT and biopsy of a mass in the [place] is performed. Biopsy of the largest pathologic RIGHT axillary lymph node is also performed. EXAM: ULTRASOUND-GUIDED RIGHT BREAST CORE NEEDLE BIOPSY X 2 ULTRASOUND GUIDED CORE NEEDLE BIOPSY OF A RIGHT AXILLARY NODE COMPARISON:  Previous exam(s). FINDINGS: I met with the patient and we discussed the procedure of ultrasound-guided biopsy, including benefits and alternatives. We discussed the high likelihood of a successful procedure. We discussed the risks of the procedure, including infection, bleeding, tissue injury, clip migration, and inadequate sampling. Informed written consent was given. The usual time-out protocol was performed immediately prior to the procedure. # 1) Lesion quadrant: Dominant mass, RIGHT UPPER INNER QUADRANT. Initially, using sterile technique with chlorhexidine as skin antisepsis, 1% lidocaine and 1% lidocaine with epinephrine as local anesthetic, under direct ultrasound visualization, a 12 gauge Bard Marquee core needle device placed through an 11 gauge introducer needle was used to perform biopsy of  the dominant mass in the [place] using a LATERAL approach. At the conclusion of the procedure a ribbon shaped tissue marker clip was deployed into the biopsy cavity. # 2) Lesion quadrant: UPPER OUTER QUADRANT. Next, using sterile technique with chlorhexidine as skin antisepsis, 1% lidocaine and 1% lidocaine with epinephrine as local anesthetic, under direct ultrasound visualization, a 12 gauge Bard Marquee core needle device placed through an 11 gauge introducer needle was used perform biopsy of the mass in the [place] using a LATERAL approach. At the conclusion of the procedure, a coil shaped tissue marker clip was deployed into the biopsy cavity. # 3) Level 1 RIGHT axillary node: Using sterile technique with chlorhexidine as skin antisepsis, 1% lidocaine and 1% lidocaine with epinephrine as local anesthetic, under direct ultrasound visualization, a 14 gauge Bard Marquee core needle device placed through a 13 gauge introducer needle was used perform biopsy of the largest pathologic RIGHT axillary lymph node using an inferolateral approach. At the conclusion of the procedure, a HydroMark spiral shaped tissue marker clip was deployed into the biopsy cavity. Follow up 2 view mammogram was performed and dictated separately. IMPRESSION: Ultrasound guided biopsy of 2 RIGHT breast masses and a  pathologic RIGHT axillary lymph node. No apparent complications. Electronically Signed: By: Evangeline Dakin M.D. On: 04/20/2019 07:33     ELIGIBLE FOR AVAILABLE RESEARCH PROTOCOL: no   ASSESSMENT: 33 y.o. 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 Specifically we reviewed the biology of the patient's diagnosis and the specifics of her situation.  We first reviewed the fact that cancer is not one disease but more than 100 different diseases and that it is important to keep them separate-- otherwise when friends and relatives discuss their own cancer experiences with [name] confusion can result. Similarly we explained that if breast cancer spreads to the bone or liver, the patient would not have bone cancer or liver cancer, but breast cancer in the bone and breast cancer in the liver: one cancer in three places-- not 3 different cancers which otherwise would have to be treated in 3 different ways.  We discussed the difference between local and systemic therapy.  Local treatment consists of surgery and radiation.  In her case she will need a mastectomy on the right and very possibly bilateral mastectomies, with a final decision to be discussed.    We also noted that in terms of sequencing of treatments, whether systemic therapy or surgery is done first does not affect the ultimate outcome.  In her case we feel that starting with chemotherapy and immunotherapy will make the surgery possible and more effective.  Next we went over the options for systemic therapy which are anti-estrogens, anti-HER-2 immunotherapy, immunotherapy and chemotherapy. [name] does not meet criteria for anti-HER-2 treatment or anti-estrogens.  She is a good candidate for chemotherapy and immunotherapy with pembrolizumab  More specifically she will receive cyclophosphamide and doxorubicin in dose dense fashion x4, with paclitaxel and carboplatin given weekly  x12.  If possible we will start the pembrolizumab next week and continue right through her chemotherapy treatments as per the recent neoadjuvant data with this drug.  [name] will need a port, a PET, and echocardiogram, and I think a good deal of assistance from our social worker navigator team.  She will need genetics testing as well.  [name] has a good understanding of the overall plan. She agrees with it.  She will call with any problems that may develop before her next visit here.    Geraldina Parrott, [name], MD  04/25/19 10:38 AM Medical Oncology and Hematology [facility] [address] [place], [place] [address] Tel. [phone]    Fax. [phone]    I, Wilburn Mylar, am acting as scribe for Dr. [name]. Tequlia Gonsalves.  I, Lurline Del MD, have reviewed the above documentation for accuracy and completeness, and I agree with the above.

## 2019-04-25 NOTE — Telephone Encounter (Signed)
Added 1st treatment for keytruda 5/7 per call from desk nurse and 4/29 schedule message. Per desk nurse patient currently in [facility] and will be given appointment prior to leaving.   No other appointments scheduled at this time. Care plan not in and nothing additional has been requested At this time.

## 2019-04-25 NOTE — Patient Instructions (Addendum)
Roselinda Bahena  04/25/2019      Your procedure is scheduled on:   04-30-2019   Report to [facility] Main  Entrance,  Report to admitting at  11:30 AM    Call this number if you have problems the morning of surgery [phone]     SPECIAL INSTRUCTIONS:   Do not take Subutex day before surgery.      Remember: Do not eat food or drink liquids :After Midnight.   This includes no water, candy, gum, or mints  BRUSH YOUR TEETH MORNING OF SURGERY AND RINSE YOUR MOUTH OUT.        Take these medicines the morning of surgery with A SIP OF WATER:   Albuterol inhaler if needed and bring with you day of surgery.                                  You may not have any metal on your body including hair pins and piercings              Do not wear jewelry, make-up, lotions, powders or perfumes, deodorant              Do not wear nail polish.  Do not shave  48 hours prior to surgery.                   Do not bring valuables to the hospital. [place].  Contacts, dentures or bridgework may not be worn into surgery.       Patients discharged the day of surgery WILL NOT be allowed to drive home.  YOU MUST HAVE AN ADULT TO DRIVE YOU HOME AND BE WITH YOU FOR 24 HOURS AFTER SURGERY DUE TO ANESTHESIA.   YOU MAY NOT GO HOME BY BUS, TAXI,  OR UBER UNLESS  AN ADULT IS  ACCOMPANYING YOU HOME AND STAY WITH YOU FOR 24 HOURS.   Name and phone number of your driver: Marliss Coots [phone]               _____________________________________________________________________             [facility] - Preparing for Surgery Before surgery, you can play an important role.  Because skin is not sterile, your skin needs to be as free of germs as possible.  You can reduce the number of germs on your skin by washing with CHG (chlorahexidine gluconate) soap before surgery.  CHG is an antiseptic cleaner which kills germs and bonds  with the skin to continue killing germs even after washing. Please DO NOT use if you have an allergy to CHG or antibacterial soaps.  If your skin becomes reddened/irritated stop using the CHG and inform your nurse when you arrive at Short Stay. Do not shave (including legs and underarms) for at least 48 hours prior to the first CHG shower.  You may shave your face/neck. Please follow these instructions carefully:  1.  Shower with CHG Soap the night before surgery and the  morning of Surgery.  2.  If you choose to wash your hair, wash your hair first as usual with your  normal  shampoo.  3.  After you shampoo, rinse your hair and body thoroughly to remove  the  shampoo.                            4.  Use CHG as you would any other liquid soap.  You can apply chg directly  to the skin and wash                       Gently with a scrungie or clean washcloth.  5.  Apply the CHG Soap to your body ONLY FROM THE NECK DOWN.   Do not use on face/ open                           Wound or open sores. Avoid contact with eyes, ears mouth and genitals (private parts).                       Wash face,  Genitals (private parts) with your normal soap.             6.  Wash thoroughly, paying special attention to the area where your surgery  will be performed.  7.  Thoroughly rinse your body with warm water from the neck down.  8.  DO NOT shower/wash with your normal soap after using and rinsing off  the CHG Soap.             9.  Pat yourself dry with a clean towel.            10.  Wear clean pajamas.            11.  Place clean sheets on your bed the night of your first shower and do not  sleep with pets. Day of Surgery : Do not apply any lotions/deodorants the morning of surgery.  Please wear clean clothes to the hospital/surgery center.  FAILURE TO FOLLOW THESE INSTRUCTIONS MAY RESULT IN THE CANCELLATION OF YOUR SURGERY PATIENT SIGNATURE_________________________________  NURSE  SIGNATURE__________________________________  ________________________________________________________________________

## 2019-04-26 ENCOUNTER — Encounter: Payer: Self-pay | Admitting: General Practice

## 2019-04-26 ENCOUNTER — Encounter (HOSPITAL_COMMUNITY)
Admission: RE | Admit: 2019-04-26 | Discharge: 2019-04-26 | Disposition: A | Discharge status: Home / Self Care | Payer: Medicaid Other | Source: Ambulatory Visit | Attending: Surgery | Admitting: Surgery

## 2019-04-26 ENCOUNTER — Other Ambulatory Visit: Payer: Self-pay

## 2019-04-26 ENCOUNTER — Other Ambulatory Visit: Payer: Self-pay | Admitting: Oncology

## 2019-04-26 ENCOUNTER — Encounter (HOSPITAL_COMMUNITY): Payer: Self-pay

## 2019-04-26 DIAGNOSIS — Z01818 Encounter for other preprocedural examination: Secondary | ICD-10-CM | POA: Insufficient documentation

## 2019-04-26 DIAGNOSIS — I10 Essential (primary) hypertension: Secondary | ICD-10-CM | POA: Diagnosis not present

## 2019-04-26 DIAGNOSIS — C50911 Malignant neoplasm of unspecified site of right female breast: Secondary | ICD-10-CM | POA: Insufficient documentation

## 2019-04-26 DIAGNOSIS — R9431 Abnormal electrocardiogram [ECG] [EKG]: Secondary | ICD-10-CM | POA: Diagnosis not present

## 2019-04-26 HISTORY — DX: Anxiety disorder, unspecified: F41.9

## 2019-04-26 HISTORY — DX: Anemia, unspecified: D64.9

## 2019-04-26 LAB — HCG, SERUM, QUALITATIVE: Preg, Serum: NEGATIVE

## 2019-04-26 MED ORDER — CHLORHEXIDINE GLUCONATE CLOTH 2 % EX PADS
6.0000 | MEDICATED_PAD | Freq: Once | CUTANEOUS | Status: DC
  Filled 2019-04-26: qty 6

## 2019-04-26 NOTE — Progress Notes (Signed)
DISCONTINUE ON PATHWAY REGIMEN - Breast     A cycle is every 21 days (cycles 1-4):     Paclitaxel      Carboplatin    A cycle is every 14 days (cycles 5-8):     Doxorubicin      Cyclophosphamide      Pegfilgrastim-xxxx   **Always confirm dose/schedule in your pharmacy ordering system**  REASON: Other Reason PRIOR TREATMENT: [id number]:  Paclitaxel 80 mg/m2 Weekly + Carboplatin AUC=6 q21 Days x 12 Weeks, Followed by Dose-Dense AC [Doxorubicin + Cyclophosphamide q14 Days x 4 Cycles] TREATMENT RESPONSE: Unable to Evaluate  START ON PATHWAY REGIMEN - Breast     A cycle is every 21 days (cycles 1-4):     Paclitaxel      Carboplatin    A cycle is every 14 days (cycles 5-8):     Doxorubicin      Cyclophosphamide      Pegfilgrastim-xxxx   **Always confirm dose/schedule in your pharmacy ordering system**  Patient Characteristics: Preoperative or Nonsurgical Candidate (Clinical Staging), Neoadjuvant Therapy followed by Surgery, Invasive Disease, Chemotherapy, HER2 Negative/Unknown/Equivocal, ER Negative/Unknown, Platinum Therapy Indicated Therapeutic Status: Preoperative or Nonsurgical Candidate (Clinical Staging) AJCC M Category: cM0 AJCC Grade: G3 Breast Surgical Plan: Neoadjuvant Therapy followed by Surgery ER Status: Negative (-) AJCC 8 Stage Grouping: IIIC HER2 Status: Negative (-) AJCC T Category: cT4 AJCC N Category: cN2 PR Status: Negative (-) Type of Therapy: Platinum Therapy Indicated Intent of Therapy: Curative Intent, Not Discussed with Patient

## 2019-04-26 NOTE — Progress Notes (Signed)
[place] Psychosocial Distress Screening Spiritual Care  Attempted to reach [name] by phone following [facility] to introduce [place] team/resources, reviewing distress screen per protocol.  The patient scored a 7 on the Psychosocial Distress Thermometer which indicates severe distress.   ONCBCN DISTRESS SCREENING 04/26/2019  Screening Type Initial Screening  Distress experienced in past week (1-10) 7  Practical problem type Housing;Work/school  Family Problem type Partner;Children  Emotional problem type Depression;Nervousness/Anxiety;Adjusting to illness;Isolation/feeling alone;Boredom  Spiritual/Religous concerns type Relating to God  Information Concerns Type Lack of info about diagnosis;Lack of info about treatment;Lack of info about complementary therapy choices;Lack of info about maintaining fitness  Physical Problem type Pain;Sleep/insomnia;Talking;Constipation/diarrhea;Tingling hands/feet;Skin dry/itchy  Referral to support programs Yes    Follow up needed: Yes.  LVM encouraging callback, but will also phone again if needed.   [place], [place], [facility] Pager [phone] Voicemail [phone]

## 2019-04-26 NOTE — Progress Notes (Signed)
After reviewing the keynote trial details I have changed the carboplatin to every 3weeks instead of weekly, with the AUC at 5 instead of 2.

## 2019-04-26 NOTE — Progress Notes (Signed)
Pt takes subutex.  Reviewed with anesthesia, Konrad Felix PA, for advice as to when or if patient should stop prior to surgery.  Per Janett Billow since procedure is only port-a-cath insertion have pt not to take day before surgery.

## 2019-04-26 NOTE — Progress Notes (Addendum)
4/30 11:00 PAT visit. Pt arrived for visitand stated that she had copious drainage from Rt lateral breast. Pt advised to go [facility] to [facility] Oncologist and let them know about the drainage.   Addendum: CBC diff and CMP done 04/25/19 results in Epic  Chest  CT 04/11/19 in epic. Chart to be reviewed by Konrad Felix PA.   Addendum: EKG pending .

## 2019-04-26 NOTE — Addendum Note (Signed)
Addended by: Chauncey Cruel on: 04/26/2019 09:38 AM   Modules accepted: Orders

## 2019-04-27 ENCOUNTER — Encounter: Payer: Self-pay | Admitting: General Practice

## 2019-04-27 ENCOUNTER — Other Ambulatory Visit: Payer: Self-pay | Admitting: Genetic Counselor

## 2019-04-27 DIAGNOSIS — C50811 Malignant neoplasm of overlapping sites of right female breast: Secondary | ICD-10-CM

## 2019-04-27 DIAGNOSIS — Z171 Estrogen receptor negative status [ER-]: Principal | ICD-10-CM

## 2019-04-27 NOTE — Progress Notes (Signed)
[facility] CSW Progress Notes  Referral received from MD to reach out to patient due to multiple psychosocial stressors.  Left VM for patient w my contact information and encouragement to call back.  Will try to call again as well.  [name], LCSW Clinical Social Worker Phone:  [phone]

## 2019-04-29 NOTE — H&P (Signed)
Sallye Lat  Location: [facility] Patient #: [id number] DOB: [birth year] Undefined / Language: Undefined / Race: Black or African American Female  History of Present Illness   The patient is a 33 year old female who presents with a complaint of breast cancer.  The PCP is none  The pateint is at the [facility] - Oncology is Drs. Magrinat and Lisbeth Renshaw  She is by herself.  [The Covid-19 virus has disrupted normal medical care in [place] and across the nation. We have sometimes had to alter normal surgical/medical care to limit this epidemic and we have explained these changes to the patient.]  She went to the [facility] ER on 04/11/2019 for swelling in her left neck. She was worried about left tooth problems (she think that she broke a tooth) and her left ear hurt. She had also been complaining to the lactation nurse about changes in her right breast. She had trouble pumping her right breast, she noticed blood in the milk, and had right nipple swelling. Her 3rd child, Yetta Glassman, was born prematurely because of placental abruption. She was born on 09/23/2016 and was in the hospital until 12/28/2018. In the ER on 04/11/2019, she had a neck and chest CT scan. The chest CT scan showed 1. Complex right breast mass measuring 5.6 x 4.5 x 4.7 cm consistent with a primary breast carcinoma. 2. Malignant right axillary adenopathy measuring up to 3.1 cm. 3. Multiple bilateral pulmonary nodules are concerning for metastatic disease. 4. Given the right breast mass, the left cervical lymph nodes are more likely malignant. The neck CT scan shows: 1. Enlarged left level 2 lymph nodes with associated inflammatory change including stranding of the adjacent fat and thickening of the platysma. While malignancy is not excluded, this is most concerning for infection. No focal abscess is present. 2. Hypoattenuation within 1 of the left level 2  lymph nodes likely represents central necrosis or suppuration of the node. 3. No primary malignancy or other abscess.  4. Left upper lobe pulmonary nodules may be inflammatory. The largest measures 7 x 7 x 6 mm. CT of the chest with contrast may be useful for further evaluation.  Mamm[facility] - 12:30 o'clock position approximately 5 cm from the nipple measuring at least 3.0 x 5.1 x 6.6 cm, with central necrosis. There is extensive edema throughout the RIGHT breast and there is marked skin thickening throughout the RIGHT breast, measuring up to 1.3 cm in the periareolar location. Biopsy: Right breast biopsy x 2 and right axillary node - 04/19/2019 ([id number]) - IDC, grade 3, triple negative, Ki67 - 90%, right axillary node POSITIVE for metastatic cancer Family history of breast or ovarian cancer: Yes grandmother On hormone therapy: No  I discussed the options for breast cancer treatment with the patient. The patient is at the [facility], which includes medical oncology and radiation oncology. I discussed the surgical options of lumpectomy vs. mastectomy. She will almost certainly need a right mastectomy. I discussed the options of lymph node biopsy. The treatment plan depends on the pathologic staging of the tumor and the patient's personal wishes. The risks of surgery include, but are not limited to, bleeding, infection, the need for further surgery, and nerve injury. The patient has been given literature on the treatment of breast cancer.  I discussed the indications and potential complications of the power port placement. The primary complications of the power port, include, but are not limited to, bleeding, infection, nerve injury,  thrombosis, and pneumothorax.  Plan: 1) Further imaging - PET and MRI, 2) Power port placement, 3) chemotherapy, 4) will eventually need a  mastectomy, depending on chemotx  Past Medical History: 1. Right breast cancer 2. History of asthma 3. 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 It sounds like her abuse was mainly alcohol and pain meds. Sees Dr. Lillia Corporal She was hospitalized [facility] Masters) for 65 days in 2016 to detox. 4. Trying to quit smoking 5. HTN History of preeclampsia 6. Asthmas On an inhaler 7. Muscle spasm On Flexeril  Social History: Celesta Gentile - [name] (but sounds like this relationship is tenuous) has 3 children: [name] (with CP) born 2010, [name] (born 2018) - has severe asthma, Zeanna (born on 09/23/2018 and was in the hospital until 12/28/2018)  She used to live in [place], but moved away to get a fresh start on life. She has a first cousin, Marliss Coots, who will help her here. Her parents are divorced and live in [place]. Before her delivery, she was working at [employer] - but this did not sound like it was going well.   Past Surgical History Tawni Pummel, RN; 04/25/2019 7:25 AM) Breast Biopsy  Right. Cesarean Section - Multiple   Diagnostic Studies History Tawni Pummel, RN; 04/25/2019 7:25 AM) Colonoscopy  never Mammogram  within last year Pap Smear  1-5 years ago  Medication History Tawni Pummel, RN; 04/25/2019 7:26 AM) Medications Reconciled  Social History Tawni Pummel, RN; 04/25/2019 7:25 AM) Caffeine use  Tea. Illicit drug use  Uses daily. No alcohol use  Tobacco use  Current some day smoker.  Family History Tawni Pummel, RN; 04/25/2019 7:25 AM) Alcohol Abuse  Family Members In General. Arthritis  Family Members In General. Breast Cancer  Family Members In General. Thyroid problems  Family Members In General.  Pregnancy / Birth History Tawni Pummel, RN; 04/25/2019 7:25 AM) Age at menarche  63 years. Gravida  6 Length  (months) of breastfeeding  7-12 Maternal age  15-20 Para  3 Regular periods   Other Problems Tawni Pummel, RN; 04/25/2019 7:25 AM) Anxiety Disorder  Asthma  Back Pain  Depression  High blood pressure  Transfusion history     Review of Systems Sunday Spillers Ledford RN; 04/25/2019 7:25 AM) General Not Present- Appetite Loss, Chills, Fatigue, Fever, Night Sweats, Weight Gain and Weight Loss. Skin Not Present- Change in Wart/Mole, Dryness, Hives, Jaundice, New Lesions, Non-Healing Wounds, Rash and Ulcer. HEENT Present- Earache and Sore Throat. Not Present- Hearing Loss, Hoarseness, Nose Bleed, Oral Ulcers, Ringing in the Ears, Seasonal Allergies, Sinus Pain, Visual Disturbances, Wears glasses/contact lenses and Yellow Eyes. Breast Present- Nipple Discharge. Not Present- Breast Mass, Breast Pain and Skin Changes. Cardiovascular Not Present- Chest Pain, Difficulty Breathing Lying Down, Leg Cramps, Palpitations, Rapid Heart Rate, Shortness of Breath and Swelling of Extremities. Gastrointestinal Present- Constipation. Not Present- Abdominal Pain, Bloating, Bloody Stool, Change in Bowel Habits, Chronic diarrhea, Difficulty Swallowing, Excessive gas, Gets full quickly at meals, Hemorrhoids, Indigestion, Nausea, Rectal Pain and Vomiting. Female Genitourinary Not Present- Frequency, Nocturia, Painful Urination, Pelvic Pain and Urgency. Musculoskeletal Present- Back Pain. Not Present- Joint Pain, Joint Stiffness, Muscle Pain, Muscle Weakness and Swelling of Extremities. Neurological Present- Trouble walking. Not Present- Decreased Memory, Fainting, Headaches, Numbness, Seizures, Tingling, Tremor and Weakness. Psychiatric Present- Anxiety, Bipolar and Depression. Not Present- Change in Sleep Pattern, Fearful and Frequent crying. Endocrine Not Present- Cold Intolerance, Excessive Hunger, Hair Changes, Heat  Intolerance, Hot flashes and New Diabetes. Hematology Not Present- Blood Thinners, Easy  Bruising, Excessive bleeding, Gland problems, HIV and Persistent Infections.   Physical Exam  General: WN younger AA F who is alert and generally healthy appearing. Skin: Inspection and palpation of the skin unremarkable.  Eyes: Conjunctivae white, pupils equal. Face, ears, nose, mouth, and throat: Face - normal. Normal ears and nose. Lips and teeth normal.  Neck: Supple. Trachea midline. No thyroid mass. Her left neck pain is better sincer her visit to the [facility] ER, they did put her on some antibiotics. Lymph Nodes: She has at least one, if not two, palpable right supraclavicular nodes that are mildly tender. I am not sure if I feel a left anterior cervical node.  She has a 3.0 cm node high in her right axilla that is tender.  Lungs: Normal respiratory effort. Clear to auscultation and symmetric breath sounds. Cardiovascular: Regular rate and rythm. Normal auscultation of the heart. No murmur or rub.  Breasts: Right - markedly enlarged (over twice the size of the left breast) with thickened skin, the "eczema" seems worse over the right breast (vs tumor nodules). There is a mass effect of at least 10 cm in the right breast (if not larger)   [there is a photo in Epic of the right breast]  Left - normal with no mass or nodule  Abdomen: Soft. No mass. Liver and spleen not palpable. No tenderness. No hernia. Normal bowel sounds. She has eczema (vs psoriasis) over her breast and skin of her lower chest and abdomen. Rectal: Not done.  Musculoskeletal/extremities: Normal gait. Good strength and ROM in upper and lower extremities.  Neurologic: Grossly intact to motor and sensory function. Psychiatric: Has normal mood and affect. Judgement and insight appear normal.  Assessment & Plan  1.  BREAST CANCER, STAGE 3, RIGHT (C50.911)  Story: Right breast biopsy x 2 and right axillary node - 04/19/2019 ([id number]) - IDC, grade 3, triple negative, Ki67 - 90%, Right axillary node POSITIVE for  metastatic cancer   Oncology - Magrinat and [place]  Plan:   1) Further imaging - PET and MRI,  2) Power port placement,   3) Neoadjuvant chemotherapy,  4) Will eventually need a mastecotmy, depending on chemotx  5) Genetics  2.  SMOKES (F17.200)  Trying to quit smoking 2. History of asthma 3. 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 It sounds like her abuse was mainly alcohol and pain meds.  Sees Dr. Lillia Corporal  She was hospitalized [facility]) for 65 days in 2016 to detox. 5. HTN History of preeclampsia 6. Muscle spasm On Flexeril   Alphonsa Overall, MD, [facility] Pager: [phone] Office phone:  [phone]

## 2019-04-29 NOTE — Anesthesia Preprocedure Evaluation (Addendum)
Anesthesia Evaluation  Patient identified by MRN, date of birth, ID band Patient awake    Reviewed: Allergy & Precautions, NPO status , Patient's Chart, lab work & pertinent test results  History of Anesthesia Complications Negative for: history of anesthetic complications  Airway Mallampati: II  TM Distance: >3 FB Neck ROM: Full    Dental  (+) Dental Advisory Given, Chipped,    Pulmonary asthma , Current Smoker, former smoker,    Pulmonary exam normal breath sounds clear to auscultation       Cardiovascular hypertension, Pt. on medications Normal cardiovascular exam Rhythm:Regular Rate:Normal     Neuro/Psych Anxiety Depression negative neurological ROS     GI/Hepatic negative GI ROS, (+)     substance abuse (Hx of opioid abuse, on buprenorphine)  marijuana use,   Endo/Other  negative endocrine ROS  Renal/GU negative Renal ROS     Musculoskeletal negative musculoskeletal ROS (+)   Abdominal   Peds  Hematology  (+) anemia ,   Anesthesia Other Findings Day of surgery medications reviewed with the patient.  Reproductive/Obstetrics                          Anesthesia Physical Anesthesia Plan  ASA: II  Anesthesia Plan: General   Post-op Pain Management:    Induction: Intravenous  PONV Risk Score and Plan: Treatment may vary due to age or medical condition, Ondansetron, Dexamethasone and Midazolam  Airway Management Planned: LMA  Additional Equipment: None  Intra-op Plan:   Post-operative Plan: Extubation in OR  Informed Consent: I have reviewed the patients History and Physical, chart, labs and discussed the procedure including the risks, benefits and alternatives for the proposed anesthesia with the patient or authorized representative who has indicated his/her understanding and acceptance.     Dental advisory given  Plan Discussed with: CRNA  Anesthesia Plan Comments:         Anesthesia Quick Evaluation

## 2019-04-30 ENCOUNTER — Ambulatory Visit (HOSPITAL_COMMUNITY)
Admission: RE | Admit: 2019-04-30 | Discharge: 2019-04-30 | Disposition: A | Discharge status: Home / Self Care | Payer: Medicaid Other | Attending: Surgery | Admitting: Surgery

## 2019-04-30 ENCOUNTER — Encounter (HOSPITAL_COMMUNITY)
Admission: RE | Disposition: A | Discharge status: Home / Self Care | Payer: Self-pay | Source: Home / Self Care | Attending: Surgery

## 2019-04-30 ENCOUNTER — Ambulatory Visit (HOSPITAL_COMMUNITY): Payer: Medicaid Other | Admitting: Anesthesiology

## 2019-04-30 ENCOUNTER — Ambulatory Visit (HOSPITAL_COMMUNITY): Payer: Medicaid Other

## 2019-04-30 ENCOUNTER — Encounter (HOSPITAL_COMMUNITY): Payer: Self-pay | Admitting: Anesthesiology

## 2019-04-30 ENCOUNTER — Telehealth: Payer: Self-pay | Admitting: *Deleted

## 2019-04-30 ENCOUNTER — Ambulatory Visit
Admission: RE | Admit: 2019-04-30 | Discharge: 2019-04-30 | Disposition: A | Discharge status: Home / Self Care | Payer: Medicaid Other | Source: Ambulatory Visit | Attending: Oncology | Admitting: Oncology

## 2019-04-30 ENCOUNTER — Telehealth: Payer: Self-pay | Admitting: Adult Health

## 2019-04-30 ENCOUNTER — Ambulatory Visit (HOSPITAL_COMMUNITY): Payer: Medicaid Other | Admitting: Physician Assistant

## 2019-04-30 ENCOUNTER — Other Ambulatory Visit: Payer: Self-pay

## 2019-04-30 DIAGNOSIS — Z452 Encounter for adjustment and management of vascular access device: Secondary | ICD-10-CM | POA: Diagnosis not present

## 2019-04-30 DIAGNOSIS — J45909 Unspecified asthma, uncomplicated: Secondary | ICD-10-CM | POA: Diagnosis not present

## 2019-04-30 DIAGNOSIS — F418 Other specified anxiety disorders: Secondary | ICD-10-CM | POA: Insufficient documentation

## 2019-04-30 DIAGNOSIS — F17200 Nicotine dependence, unspecified, uncomplicated: Secondary | ICD-10-CM | POA: Insufficient documentation

## 2019-04-30 DIAGNOSIS — C50911 Malignant neoplasm of unspecified site of right female breast: Secondary | ICD-10-CM | POA: Diagnosis not present

## 2019-04-30 DIAGNOSIS — I10 Essential (primary) hypertension: Secondary | ICD-10-CM | POA: Diagnosis not present

## 2019-04-30 DIAGNOSIS — Z171 Estrogen receptor negative status [ER-]: Principal | ICD-10-CM

## 2019-04-30 DIAGNOSIS — Z79899 Other long term (current) drug therapy: Secondary | ICD-10-CM | POA: Insufficient documentation

## 2019-04-30 DIAGNOSIS — Z95828 Presence of other vascular implants and grafts: Secondary | ICD-10-CM

## 2019-04-30 DIAGNOSIS — C50811 Malignant neoplasm of overlapping sites of right female breast: Secondary | ICD-10-CM

## 2019-04-30 HISTORY — PX: PORTACATH PLACEMENT: SHX2246

## 2019-04-30 SURGERY — INSERTION, TUNNELED CENTRAL VENOUS DEVICE, WITH PORT
Anesthesia: General | Site: Chest | Laterality: Left

## 2019-04-30 MED ORDER — FENTANYL CITRATE (PF) 100 MCG/2ML IJ SOLN
INTRAMUSCULAR | Status: AC
  Filled 2019-04-30: qty 2

## 2019-04-30 MED ORDER — SODIUM CHLORIDE 0.9 % IV SOLN
Freq: Once | INTRAVENOUS | Status: AC
  Administered 2019-04-30: 13:00:00
  Filled 2019-04-30: qty 1.2

## 2019-04-30 MED ORDER — BUPIVACAINE-EPINEPHRINE (PF) 0.25% -1:200000 IJ SOLN
INTRAMUSCULAR | Status: AC
  Filled 2019-04-30: qty 30

## 2019-04-30 MED ORDER — PROPOFOL 10 MG/ML IV BOLUS
INTRAVENOUS | Status: DC | PRN
  Administered 2019-04-30: 170 mg via INTRAVENOUS

## 2019-04-30 MED ORDER — FENTANYL CITRATE (PF) 100 MCG/2ML IJ SOLN: 25.0000 ug | INTRAMUSCULAR | Status: DC | PRN

## 2019-04-30 MED ORDER — PHENYLEPHRINE HCL (PRESSORS) 10 MG/ML IV SOLN
INTRAVENOUS | Status: DC | PRN
  Administered 2019-04-30: 80 ug via INTRAVENOUS

## 2019-04-30 MED ORDER — OXYCODONE HCL 5 MG/5ML PO SOLN: 5.0000 mg | Freq: Once | ORAL | Status: DC | PRN

## 2019-04-30 MED ORDER — PHENYLEPHRINE 40 MCG/ML (10ML) SYRINGE FOR IV PUSH (FOR BLOOD PRESSURE SUPPORT)
PREFILLED_SYRINGE | INTRAVENOUS | Status: AC
  Filled 2019-04-30: qty 10

## 2019-04-30 MED ORDER — PROMETHAZINE HCL 25 MG/ML IJ SOLN: 6.2500 mg | INTRAMUSCULAR | Status: DC | PRN

## 2019-04-30 MED ORDER — LACTATED RINGERS IV SOLN
INTRAVENOUS | Status: DC
  Administered 2019-04-30: 11:00:00 via INTRAVENOUS

## 2019-04-30 MED ORDER — LIDOCAINE 2% (20 MG/ML) 5 ML SYRINGE
INTRAMUSCULAR | Status: AC
  Filled 2019-04-30: qty 5

## 2019-04-30 MED ORDER — SUCCINYLCHOLINE CHLORIDE 200 MG/10ML IV SOSY
PREFILLED_SYRINGE | INTRAVENOUS | Status: AC
  Filled 2019-04-30: qty 10

## 2019-04-30 MED ORDER — MIDAZOLAM HCL 2 MG/2ML IJ SOLN
INTRAMUSCULAR | Status: AC
  Filled 2019-04-30: qty 2

## 2019-04-30 MED ORDER — GADOBUTROL 1 MMOL/ML IV SOLN
9.0000 mL | Freq: Once | INTRAVENOUS | Status: AC | PRN
  Administered 2019-04-30: 9 mL via INTRAVENOUS

## 2019-04-30 MED ORDER — OXYCODONE HCL 5 MG PO TABS: 5.0000 mg | ORAL_TABLET | Freq: Once | ORAL | Status: DC | PRN

## 2019-04-30 MED ORDER — ONDANSETRON HCL 4 MG/2ML IJ SOLN
INTRAMUSCULAR | Status: AC
  Filled 2019-04-30: qty 2

## 2019-04-30 MED ORDER — ACETAMINOPHEN 500 MG PO TABS
1000.0000 mg | ORAL_TABLET | ORAL | Status: AC
  Administered 2019-04-30: 1000 mg via ORAL
  Filled 2019-04-30: qty 2

## 2019-04-30 MED ORDER — ACETAMINOPHEN 10 MG/ML IV SOLN: 1000.0000 mg | Freq: Once | INTRAVENOUS | Status: DC | PRN

## 2019-04-30 MED ORDER — LIDOCAINE HCL (CARDIAC) PF 100 MG/5ML IV SOSY
PREFILLED_SYRINGE | INTRAVENOUS | Status: DC | PRN
  Administered 2019-04-30: 80 mg via INTRAVENOUS
  Administered 2019-04-30: 20 mg via INTRAVENOUS

## 2019-04-30 MED ORDER — LIDOCAINE HCL (PF) 1 % IJ SOLN
INTRAMUSCULAR | Status: AC
  Filled 2019-04-30: qty 30

## 2019-04-30 MED ORDER — MIDAZOLAM HCL 5 MG/5ML IJ SOLN
INTRAMUSCULAR | Status: DC | PRN
  Administered 2019-04-30: 2 mg via INTRAVENOUS

## 2019-04-30 MED ORDER — HEPARIN SOD (PORK) LOCK FLUSH 100 UNIT/ML IV SOLN
INTRAVENOUS | Status: AC
  Filled 2019-04-30: qty 5

## 2019-04-30 MED ORDER — HEPARIN SOD (PORK) LOCK FLUSH 100 UNIT/ML IV SOLN
INTRAVENOUS | Status: DC | PRN
  Administered 2019-04-30: 500 [IU] via INTRAVENOUS

## 2019-04-30 MED ORDER — CEFAZOLIN SODIUM-DEXTROSE 2-4 GM/100ML-% IV SOLN
2.0000 g | INTRAVENOUS | Status: AC
  Administered 2019-04-30: 2 g via INTRAVENOUS
  Filled 2019-04-30: qty 100

## 2019-04-30 MED ORDER — TRAMADOL HCL 50 MG PO TABS: 50.0000 mg | ORAL_TABLET | Freq: Four times a day (QID) | ORAL | 1 refills | Status: DC | PRN

## 2019-04-30 MED ORDER — PROPOFOL 10 MG/ML IV BOLUS
INTRAVENOUS | Status: AC
  Filled 2019-04-30: qty 40

## 2019-04-30 MED ORDER — ROCURONIUM BROMIDE 10 MG/ML (PF) SYRINGE
PREFILLED_SYRINGE | INTRAVENOUS | Status: AC
  Filled 2019-04-30: qty 10

## 2019-04-30 MED ORDER — DEXAMETHASONE SODIUM PHOSPHATE 10 MG/ML IJ SOLN
INTRAMUSCULAR | Status: AC
  Filled 2019-04-30: qty 1

## 2019-04-30 MED ORDER — BUPIVACAINE-EPINEPHRINE (PF) 0.25% -1:200000 IJ SOLN
INTRAMUSCULAR | Status: DC | PRN
  Administered 2019-04-30: 14 mL

## 2019-04-30 MED ORDER — DEXAMETHASONE SODIUM PHOSPHATE 4 MG/ML IJ SOLN
INTRAMUSCULAR | Status: DC | PRN
  Administered 2019-04-30: 10 mg via INTRAVENOUS

## 2019-04-30 MED ORDER — ONDANSETRON HCL 4 MG/2ML IJ SOLN
INTRAMUSCULAR | Status: DC | PRN
  Administered 2019-04-30: 4 mg via INTRAVENOUS

## 2019-04-30 MED ORDER — FENTANYL CITRATE (PF) 100 MCG/2ML IJ SOLN
INTRAMUSCULAR | Status: DC | PRN
  Administered 2019-04-30: 25 ug via INTRAVENOUS
  Administered 2019-04-30 (×2): 50 ug via INTRAVENOUS
  Administered 2019-04-30 (×2): 25 ug via INTRAVENOUS

## 2019-04-30 SURGICAL SUPPLY — 31 items
BAG DECANTER FOR FLEXI CONT (MISCELLANEOUS) ×3 IMPLANT
BENZOIN TINCTURE PRP APPL 2/3 (GAUZE/BANDAGES/DRESSINGS) IMPLANT
BLADE SURG 15 STRL LF DISP TIS (BLADE) ×1 IMPLANT
BLADE SURG 15 STRL SS (BLADE) ×2
CHLORAPREP W/TINT 26 (MISCELLANEOUS) ×3 IMPLANT
COVER PROBE U/S 5X48 (MISCELLANEOUS) IMPLANT
COVER SURGICAL LIGHT HANDLE (MISCELLANEOUS) ×3 IMPLANT
COVER WAND RF STERILE (DRAPES) IMPLANT
DECANTER SPIKE VIAL GLASS SM (MISCELLANEOUS) ×3 IMPLANT
DERMABOND ADVANCED (GAUZE/BANDAGES/DRESSINGS) ×2
DERMABOND ADVANCED .7 DNX12 (GAUZE/BANDAGES/DRESSINGS) ×1 IMPLANT
DRAPE C-ARM 42X120 X-RAY (DRAPES) ×3 IMPLANT
DRAPE LAPAROSCOPIC ABDOMINAL (DRAPES) ×3 IMPLANT
ELECT PENCIL ROCKER SW 15FT (MISCELLANEOUS) ×3 IMPLANT
ELECT REM PT RETURN 15FT ADLT (MISCELLANEOUS) ×3 IMPLANT
GAUZE 4X4 16PLY RFD (DISPOSABLE) ×3 IMPLANT
GAUZE SPONGE 2X2 8PLY STRL LF (GAUZE/BANDAGES/DRESSINGS) IMPLANT
GLOVE SURG SIGNA 7.5 PF LTX (GLOVE) ×3 IMPLANT
GOWN STRL REUS W/TWL XL LVL3 (GOWN DISPOSABLE) ×6 IMPLANT
KIT BASIN OR (CUSTOM PROCEDURE TRAY) ×3 IMPLANT
KIT PORT POWER 8FR ISP CVUE (Port) ×3 IMPLANT
KIT TURNOVER KIT A (KITS) IMPLANT
NEEDLE HYPO 25X1 1.5 SAFETY (NEEDLE) ×3 IMPLANT
PACK BASIC VI WITH GOWN DISP (CUSTOM PROCEDURE TRAY) ×3 IMPLANT
SPONGE GAUZE 2X2 STER 10/PKG (GAUZE/BANDAGES/DRESSINGS)
SUT MNCRL AB 4-0 PS2 18 (SUTURE) ×3 IMPLANT
SUT VIC AB 3-0 SH 18 (SUTURE) ×3 IMPLANT
SYR 10ML ECCENTRIC (SYRINGE) ×3 IMPLANT
SYR CONTROL 10ML LL (SYRINGE) ×3 IMPLANT
TOWEL OR 17X26 10 PK STRL BLUE (TOWEL DISPOSABLE) ×3 IMPLANT
TOWEL OR NON WOVEN STRL DISP B (DISPOSABLE) ×3 IMPLANT

## 2019-04-30 NOTE — Telephone Encounter (Signed)
This RN spoke with pt per request of scheduler calling pt regarding appointment tomorrow - [name] informed her she is unable to get her pain medication because she is under [place] for history of substance abuse. [name] had her port placed earlier today.  [name] states she told the surgeon that he would have to be added - " and I thought the nurse was going to call or something because I don't have the number and then they need numbers I don't have for the doctor "  This RN informed pt - situation would be followed and call return ASAP.  This RN contacted the pt's pharmacy and verified prescription is available but need for Medicaid [place] [employer] to be contacted for prescriber to be added for pt to obtain controlled substance.  Number for contact to Medicaid given as [phone].  This RN called above and discussed situation with representative- per policy of [place] [employer] and pt's benefit for narcotics is on lockdown - pt has to call to add MD to list for dispensing of medication.  This RN discussed pt had surgery today and is in urgent need and concern for possible misunderstanding by pt on what needs to be done.  Representative states all pt has to do is call this same number - and give MD first and last name- no other information or DEA number etc required.  This RN called pt promptly and informed her of above - as well as pt wrote number for contact for Medicaid [place] [employer] and the name for Dr Alphonsa Overall.  Patient understands to call if needed - she stated appreciation for the above assistance.

## 2019-04-30 NOTE — Interval H&P Note (Signed)
History and Physical Interval Note:  04/30/2019 12:41 PM  Terri Estes  has presented today for surgery, with the diagnosis of RIGHT BREAST CANCER.  The various methods of treatment have been discussed with the patient and family.   After consideration of risks, benefits and other options for treatment, the patient has consented to  Procedure(s): INSERTION PORT-A-CATH WITH ULTRASOUND (N/A) as a surgical intervention.  The patient's history has been reviewed, patient examined, no change in status, stable for surgery.  I have reviewed the patient's chart and labs.  Questions were answered to the patient's satisfaction.     Shann Medal

## 2019-04-30 NOTE — Anesthesia Procedure Notes (Signed)
Procedure Name: LMA Insertion Date/Time: 04/30/2019 1:00 PM Performed by: Lavina Hamman, CRNA Pre-anesthesia Checklist: Patient identified, Emergency Drugs available, Suction available and Patient being monitored Patient Re-evaluated:Patient Re-evaluated prior to induction Oxygen Delivery Method: Circle System Utilized Preoxygenation: Pre-oxygenation with 100% oxygen Induction Type: IV induction Ventilation: Mask ventilation without difficulty LMA: LMA inserted LMA Size: 4.0 Number of attempts: 1 Airway Equipment and Method: Bite block Placement Confirmation: positive ETCO2 Tube secured with: Tape Dental Injury: Teeth and Oropharynx as per pre-operative assessment

## 2019-04-30 NOTE — Discharge Instructions (Signed)
[facility] - DISCHARGE INSTRUCTIONS TO PATIENT  Activity:  Driving - May drive in one or two days, if off pain meds   Lifting - No lifting more than 15 pounds for 5 days, then no limit  Wound Care:   Leave the incision dry for 2 days, then you may shower  Diet:  As tolerated  Follow up appointment:  Call Dr. Pollie Friar office [facility]) at [phone] for an appointment in 2 months.           I want to see you half way through the chemotherapy to make plans for surgery at the end of chemotherapy.  Medications and dosages:  Resume your home medications.  You have a prescription for:  Ultram  Call Dr. Lucia Gaskins or his office  [phone]) if you have:  Temperature greater than 100.4,  Persistent nausea and vomiting,  Severe uncontrolled pain,  Redness, tenderness, or signs of infection (pain, swelling, redness, odor or green/yellow discharge around the site),  Difficulty breathing, headache or visual disturbances,  Any other questions or concerns you may have after discharge.  In an emergency, call 911 or go to an Emergency Department at a nearby hospital.

## 2019-04-30 NOTE — Anesthesia Postprocedure Evaluation (Signed)
Anesthesia Post Note  Patient: Terri Estes  Procedure(s) Performed: INSERTION PORT-A-CATH WITH ULTRASOUND (Left Chest)     Patient location during evaluation: PACU Anesthesia Type: General Level of consciousness: awake and alert Pain management: pain level controlled Vital Signs Assessment: post-procedure vital signs reviewed and stable Respiratory status: spontaneous breathing, nonlabored ventilation and respiratory function stable Cardiovascular status: blood pressure returned to baseline and stable Postop Assessment: no apparent nausea or vomiting Anesthetic complications: no    Last Vitals:  Vitals:   04/30/19 1400 04/30/19 1415  BP: 106/82 110/75  Pulse: 77 71  Resp: 13 12  Temp:    SpO2: 100% 100%    Last Pain:  Vitals:   04/30/19 1415  TempSrc:   PainSc: 0-No pain                 Brennan Bailey

## 2019-04-30 NOTE — Transfer of Care (Signed)
Immediate Anesthesia Transfer of Care Note  Patient: Terri Estes  Procedure(s) Performed: Procedure(s) with comments: INSERTION PORT-A-CATH WITH ULTRASOUND (Left) - Left Subclavian vein  Patient Location: PACU  Anesthesia Type:General  Level of Consciousness:  sedated, patient cooperative and responds to stimulation  Airway & Oxygen Therapy:Patient Spontanous Breathing and Patient connected to face mask oxgen  Post-op Assessment:  Report given to PACU RN and Post -op Vital signs reviewed and stable  Post vital signs:  Reviewed and stable  Last Vitals:  Vitals:   04/30/19 1016  BP: 103/73  Pulse: 84  Resp: 16  Temp: 36.7 C  SpO2: 14%    Complications: No apparent anesthesia complications

## 2019-04-30 NOTE — Op Note (Addendum)
04/30/2019  1:39 PM  PATIENT:  Terri Estes, 33 y.o., female MRN: [id number] DOB: 09-30-1986  PREOP DIAGNOSIS:  RIGHT BREAST CANCER, anticipate chemotherapy  POSTOP DIAGNOSIS:   Right breast cancer, anticipate chemotherapy  PROCEDURE:   Procedure(s):  INSERTION of left subclavian PORT-A-CATH  SURGEON:   Alphonsa Overall, M.D.  ANESTHESIA:   general  Anesthesiologist: Brennan Bailey, MD CRNA: Lavina Hamman, CRNA  General  EBL:  minimal  ml  COUNTS CORRECT:  YES  INDICATIONS FOR PROCEDURE:  [name] is a 33 y.o. (DOB: 1986-02-27) AA female whose primary care physician is Patient, No Pcp Per and comes for power port placement for the treatment of right breast cancer.  Dr. Jana Hakim is her treating oncologist.   The indications and risks of the surgery were explained to the patient.  The risks include, but are not limited to, infection, bleeding, pneumothorax, nerve injury, and thrombosis of the vein.  OPERATIVE NOTE:  The patient was taken to OR room #1 at [facility].  Anesthesia was provided by Anesthesiologist: Brennan Bailey, MD CRNA: Lavina Hamman, CRNA.  At the beginning of the operation, the patient was given 2 gm Ancef, had a roll placed under her back, and had the upper chest/neck prepped with Chloroprep and draped.   A time out was held and the surgery checklist reviewed.   The patient was placed in Trendelenburg position.  The left subclavian vein was accessed with a 16 gauge needle and a guide wire threaded through the needle into the vein.  The position of the wire was checked with fluoroscopy.   I then developed a pocket in the upper inner aspect of the left chest for the port reservoir.  I used the [employer] for venous access.  The reservoir was sewn in place with a 3-0 Vicryl suture.  The reservoir had been flushed with dilute (10 units/cc) heparin.   I then passed the silastic tubing from the reservoir incision to the subclavian  stick site and used the 8 French introducer to pass it into the vein.  The tip of the silastic catheter was position at the junction of the SVC and the right atrium under fluoroscopy.  The silastic catheter was then attached to the port with the bayonet device.     The entire port and tubing were checked with fluoroscopy and then the port was flushed with 4 cc of concentrated heparin (100 units/cc). I used 15 cc of 1/4% marcaine in the wounds.   The wounds were then closed with 3-0 vicryl subcutaneous sutures and the skin closed with a 4-0 Monocryl suture.  The skin was painted with DermaBond.   The patient was transferred to the recovery room in good condition.  The sponge and needle count were correct at the end of the case.  A CXR is ordered for port placement and pending at the time of this note.   Her aunt is Missie Zenaida Niece [phone] - is her contact.  Alphonsa Overall, MD, [facility] Surgery Pager: [phone] Office phone:  [phone]

## 2019-04-30 NOTE — Telephone Encounter (Signed)
Called to schedule peer to peer for patient PET scan.  It was scheduled to be tomorrow, 05/01/2019 at 10 am with Dr. Lodema Hong.    Time spent: 15 min  Wilber Bihari, NP

## 2019-05-01 ENCOUNTER — Encounter (HOSPITAL_COMMUNITY): Payer: Self-pay | Admitting: Surgery

## 2019-05-01 ENCOUNTER — Inpatient Hospital Stay: Payer: Medicaid Other

## 2019-05-01 ENCOUNTER — Ambulatory Visit (HOSPITAL_COMMUNITY)
Admission: RE | Admit: 2019-05-01 | Discharge: 2019-05-01 | Disposition: A | Discharge status: Home / Self Care | Payer: Medicaid Other | Source: Ambulatory Visit | Attending: Oncology | Admitting: Oncology

## 2019-05-01 ENCOUNTER — Telehealth: Payer: Self-pay | Admitting: Adult Health

## 2019-05-01 ENCOUNTER — Telehealth: Payer: Self-pay | Admitting: *Deleted

## 2019-05-01 DIAGNOSIS — Z171 Estrogen receptor negative status [ER-]: Secondary | ICD-10-CM

## 2019-05-01 DIAGNOSIS — C50811 Malignant neoplasm of overlapping sites of right female breast: Secondary | ICD-10-CM

## 2019-05-01 NOTE — Telephone Encounter (Signed)
Spoke with Dr. Lodema Hong at 10:04 AM in regards to [facility] request for PET scan.  I reviewed the size of the pulmonary nodules.  She noted that the PET scan is only indicated for pulmonary nodules 19mm or larger.  She did however authorize CT A/P and bone scan which was noted not to take away the appeal rights should a PET scan still be desired.  Orders Placed for CT A/P and bone scan.    Authorization for CT A/P and bone scan: [id number]  Time spent: 6 minutes  Wilber Bihari, NP

## 2019-05-01 NOTE — Progress Notes (Signed)
Echocardiogram 2D Echocardiogram has been performed.  Darlina Sicilian M 05/01/2019, 10:53 AM

## 2019-05-02 ENCOUNTER — Other Ambulatory Visit: Payer: Self-pay | Admitting: Genetic Counselor

## 2019-05-02 ENCOUNTER — Inpatient Hospital Stay: Payer: Medicaid Other

## 2019-05-02 ENCOUNTER — Encounter: Payer: Self-pay | Admitting: Genetic Counselor

## 2019-05-02 ENCOUNTER — Ambulatory Visit (HOSPITAL_COMMUNITY): Payer: Medicaid Other

## 2019-05-02 ENCOUNTER — Other Ambulatory Visit: Payer: Self-pay

## 2019-05-02 ENCOUNTER — Inpatient Hospital Stay: Payer: Medicaid Other | Attending: Genetic Counselor | Admitting: Genetic Counselor

## 2019-05-02 DIAGNOSIS — C50811 Malignant neoplasm of overlapping sites of right female breast: Secondary | ICD-10-CM

## 2019-05-02 DIAGNOSIS — Z171 Estrogen receptor negative status [ER-]: Secondary | ICD-10-CM | POA: Diagnosis not present

## 2019-05-02 DIAGNOSIS — Z5112 Encounter for antineoplastic immunotherapy: Secondary | ICD-10-CM | POA: Diagnosis not present

## 2019-05-02 DIAGNOSIS — Z87891 Personal history of nicotine dependence: Secondary | ICD-10-CM | POA: Insufficient documentation

## 2019-05-02 DIAGNOSIS — C50411 Malignant neoplasm of upper-outer quadrant of right female breast: Secondary | ICD-10-CM | POA: Diagnosis present

## 2019-05-02 DIAGNOSIS — F1220 Cannabis dependence, uncomplicated: Secondary | ICD-10-CM | POA: Diagnosis not present

## 2019-05-02 DIAGNOSIS — Z5111 Encounter for antineoplastic chemotherapy: Secondary | ICD-10-CM | POA: Insufficient documentation

## 2019-05-02 DIAGNOSIS — M542 Cervicalgia: Secondary | ICD-10-CM | POA: Diagnosis not present

## 2019-05-02 DIAGNOSIS — Z841 Family history of disorders of kidney and ureter: Secondary | ICD-10-CM | POA: Insufficient documentation

## 2019-05-02 DIAGNOSIS — Z79899 Other long term (current) drug therapy: Secondary | ICD-10-CM | POA: Diagnosis not present

## 2019-05-02 DIAGNOSIS — L409 Psoriasis, unspecified: Secondary | ICD-10-CM | POA: Diagnosis not present

## 2019-05-02 DIAGNOSIS — R590 Localized enlarged lymph nodes: Secondary | ICD-10-CM | POA: Diagnosis not present

## 2019-05-02 DIAGNOSIS — Z72820 Sleep deprivation: Secondary | ICD-10-CM | POA: Insufficient documentation

## 2019-05-02 DIAGNOSIS — C50211 Malignant neoplasm of upper-inner quadrant of right female breast: Secondary | ICD-10-CM | POA: Insufficient documentation

## 2019-05-02 DIAGNOSIS — Z7951 Long term (current) use of inhaled steroids: Secondary | ICD-10-CM | POA: Diagnosis not present

## 2019-05-02 DIAGNOSIS — E860 Dehydration: Secondary | ICD-10-CM | POA: Insufficient documentation

## 2019-05-02 DIAGNOSIS — Z803 Family history of malignant neoplasm of breast: Secondary | ICD-10-CM | POA: Diagnosis not present

## 2019-05-02 DIAGNOSIS — Z802 Family history of malignant neoplasm of other respiratory and intrathoracic organs: Secondary | ICD-10-CM | POA: Diagnosis not present

## 2019-05-02 DIAGNOSIS — Z800 Family history of malignant neoplasm of digestive organs: Secondary | ICD-10-CM | POA: Diagnosis not present

## 2019-05-02 DIAGNOSIS — Z833 Family history of diabetes mellitus: Secondary | ICD-10-CM | POA: Insufficient documentation

## 2019-05-02 DIAGNOSIS — R609 Edema, unspecified: Secondary | ICD-10-CM | POA: Diagnosis not present

## 2019-05-02 DIAGNOSIS — K219 Gastro-esophageal reflux disease without esophagitis: Secondary | ICD-10-CM | POA: Diagnosis not present

## 2019-05-02 DIAGNOSIS — I2510 Atherosclerotic heart disease of native coronary artery without angina pectoris: Secondary | ICD-10-CM | POA: Diagnosis not present

## 2019-05-02 DIAGNOSIS — R918 Other nonspecific abnormal finding of lung field: Secondary | ICD-10-CM | POA: Diagnosis not present

## 2019-05-02 DIAGNOSIS — R197 Diarrhea, unspecified: Secondary | ICD-10-CM | POA: Insufficient documentation

## 2019-05-02 DIAGNOSIS — Z820 Family history of epilepsy and other diseases of the nervous system: Secondary | ICD-10-CM | POA: Insufficient documentation

## 2019-05-02 DIAGNOSIS — G8929 Other chronic pain: Secondary | ICD-10-CM | POA: Diagnosis not present

## 2019-05-02 DIAGNOSIS — R234 Changes in skin texture: Secondary | ICD-10-CM | POA: Diagnosis not present

## 2019-05-02 LAB — CBC WITH DIFFERENTIAL (CANCER CENTER ONLY)
Abs Immature Granulocytes: 0.01 10*3/uL (ref 0.00–0.07)
Basophils Absolute: 0 10*3/uL (ref 0.0–0.1)
Basophils Relative: 1 %
Eosinophils Absolute: 0.1 10*3/uL (ref 0.0–0.5)
Eosinophils Relative: 2 %
HCT: 34.4 % — ABNORMAL LOW (ref 36.0–46.0)
Hemoglobin: 10.7 g/dL — ABNORMAL LOW (ref 12.0–15.0)
Immature Granulocytes: 0 %
Lymphocytes Relative: 33 %
Lymphs Abs: 2.4 10*3/uL (ref 0.7–4.0)
MCH: 29.5 pg (ref 26.0–34.0)
MCHC: 31.1 g/dL (ref 30.0–36.0)
MCV: 94.8 fL (ref 80.0–100.0)
Monocytes Absolute: 0.4 10*3/uL (ref 0.1–1.0)
Monocytes Relative: 5 %
Neutro Abs: 4.3 10*3/uL (ref 1.7–7.7)
Neutrophils Relative %: 59 %
Platelet Count: 317 10*3/uL (ref 150–400)
RBC: 3.63 MIL/uL — ABNORMAL LOW (ref 3.87–5.11)
RDW: 14.1 % (ref 11.5–15.5)
WBC Count: 7.3 10*3/uL (ref 4.0–10.5)
nRBC: 0 % (ref 0.0–0.2)

## 2019-05-02 LAB — CMP (CANCER CENTER ONLY)
ALT: 27 U/L (ref 0–44)
AST: 19 U/L (ref 15–41)
Albumin: 3.6 g/dL (ref 3.5–5.0)
Alkaline Phosphatase: 103 U/L (ref 38–126)
Anion gap: 8 (ref 5–15)
BUN: 14 mg/dL (ref 6–20)
CO2: 28 mmol/L (ref 22–32)
Calcium: 9 mg/dL (ref 8.9–10.3)
Chloride: 105 mmol/L (ref 98–111)
Creatinine: 0.85 mg/dL (ref 0.44–1.00)
GFR, Est AFR Am: >60 mL/min (ref >60)
GFR, Estimated: >60 mL/min (ref >60)
Glucose, Bld: 90 mg/dL (ref 70–99)
Potassium: 3.5 mmol/L (ref 3.5–5.1)
Sodium: 141 mmol/L (ref 135–145)
Total Bilirubin: <0.2 mg/dL — ABNORMAL LOW (ref 0.3–1.2)
Total Protein: 7.4 g/dL (ref 6.5–8.1)

## 2019-05-02 NOTE — Progress Notes (Signed)
REFERRING PROVIDER: Chauncey Cruel, MD [place], [place] [id number]  PRIMARY PROVIDER:  Patient, No Pcp Per  PRIMARY REASON FOR VISIT:  1. Malignant neoplasm of overlapping sites of right breast in female, estrogen receptor negative ([place])   2. Family history of breast cancer      HISTORY OF PRESENT ILLNESS:   Terri Estes, a 33 y.o. female, was seen for a [place] cancer genetics consultation at the request of Dr. Jana Hakim due to a personal and family history of breast cancer.  Terri Estes presents to clinic today to discuss the possibility of a hereditary predisposition to cancer, genetic testing, and to further clarify her future cancer risks, as well as potential cancer risks for family members.   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 The treatment plan includes chemotherapy, surgery and radiation.      CANCER HISTORY:    Malignant neoplasm of overlapping sites of right breast in female, estrogen receptor negative ([place])   04/25/2019 Initial Diagnosis    Malignant neoplasm of overlapping sites of right breast in female, estrogen receptor negative ([place])    05/03/2019 -  Chemotherapy    The patient had pembrolizumab (KEYTRUDA) 200 mg in sodium chloride 0.9 % 50 mL chemo infusion, 200 mg, Intravenous, Once, 0 of 6 cycles  for chemotherapy treatment.     05/08/2019 - 05/08/2019 Chemotherapy    The patient had DOXOrubicin (ADRIAMYCIN) chemo injection 120 mg, 60 mg/m2, Intravenous,  Once, 0 of 4 cycles palonosetron (ALOXI) injection 0.25 mg, 0.25 mg, Intravenous,  Once, 0 of 8 cycles pegfilgrastim-cbqv (UDENYCA) injection 6 mg, 6 mg, Subcutaneous, Once, 0 of 4 cycles CARBOplatin (PARAPLATIN) 300 mg in sodium chloride 0.9 % 100 mL chemo infusion, 300 mg (100 % of original dose 300 mg), Intravenous,  Once, 0 of 4 cycles Dose modification:   (original dose 300 mg,  Cycle 1) cyclophosphamide (CYTOXAN) in sodium chloride 0.9 % 250 mL chemo infusion, , Intravenous,  Once, 0 of 4 cycles PACLitaxel (TAXOL) 162 mg in sodium chloride 0.9 % 250 mL chemo infusion (</= 12m/m2), 80 mg/m2 = 162 mg, Intravenous,  Once, 0 of 4 cycles fosaprepitant (EMEND) 150 mg, dexamethasone (DECADRON) 12 mg in sodium chloride 0.9 % 145 mL IVPB, , Intravenous,  Once, 0 of 8 cycles  for chemotherapy treatment.     05/08/2019 -  Chemotherapy    The patient had DOXOrubicin (ADRIAMYCIN) chemo injection 120 mg, 60 mg/m2, Intravenous,  Once, 0 of 4 cycles palonosetron (ALOXI) injection 0.25 mg, 0.25 mg, Intravenous,  Once, 0 of 8 cycles pegfilgrastim-cbqv (UDENYCA) injection 6 mg, 6 mg, Subcutaneous, Once, 0 of 4 cycles CARBOplatin (PARAPLATIN) 750 mg in sodium chloride 0.9 % 250 mL chemo infusion, 750 mg (100 % of original dose 750 mg), Intravenous,  Once, 0 of 4 cycles Dose modification:   (original dose 750 mg, Cycle 1) cyclophosphamide (CYTOXAN) in sodium chloride 0.9 % 250 mL chemo infusion, , Intravenous,  Once, 0 of 4 cycles PACLitaxel (TAXOL) 162 mg in sodium chloride 0.9 % 250 mL chemo infusion (</= 875mm2), 80 mg/m2, Intravenous,  Once, 0 of 4 cycles fosaprepitant (EMEND) 150 mg, dexamethasone (DECADRON) 12 mg in sodium chloride 0.9 % 145 mL IVPB, , Intravenous,  Once, 0 of 8 cycles  for chemotherapy treatment.       RISK FACTORS:  Menarche was at age 33 First live  birth at age 41.  OCP use for approximately <1 year years.  Ovaries intact: yes.  Hysterectomy: no.  Menopausal status: premenopausal.  HRT use: 0 years. Colonoscopy: no; not examined. Mammogram within the last year: yes. Number of breast biopsies: 1. Up to date with pelvic exams: yes. Any excessive radiation exposure in the past: no  Past Medical History:  Diagnosis Date  . Anemia    after pregnancy  . Anxiety    severe not taking meds  . Asthma    inhaler  4x per day  . Chronic pain   . Family  history of breast cancer   . History of substance abuse ([place])   . Pregnancy induced hypertension    takes medication now.  . Psoriasis   . Trichomonas infection   . Vaginal Pap smear, abnormal     Past Surgical History:  Procedure Laterality Date  . CESAREAN SECTION  05/26/2008  . CESAREAN SECTION N/A 07/10/2017   Procedure: CESAREAN SECTION;  Surgeon: Florian Buff, MD;  Location: [facility];  Service: Obstetrics;  Laterality: N/A;  . CESAREAN SECTION N/A 09/23/2018   Procedure: CESAREAN SECTION;  Surgeon: Donnamae Jude, MD;  Location: [facility];  Service: Obstetrics;  Laterality: N/A;  . COLPOSCOPY    . PORTACATH PLACEMENT Left 04/30/2019   Procedure: INSERTION PORT-A-CATH WITH ULTRASOUND;  Surgeon: Alphonsa Overall, MD;  Location: [facility];  Service: General;  Laterality: Left;  Left Subclavian vein  . WISDOM TOOTH EXTRACTION      Social History   Socioeconomic History  . Marital status: Significant Other    Spouse name: [name]   . Number of children: 3  . Years of education: Not on file  . Highest education level: Not on file  Occupational History  . Not on file  Social Needs  . Financial resource strain: Not on file  . Food insecurity:    Worry: Not on file    Inability: Not on file  . Transportation needs:    Medical: Not on file    Non-medical: Not on file  Tobacco Use  . Smoking status: Former Smoker    Packs/day: 0.50    Years: 5.00    Pack years: 2.50    Types: Cigarettes    Last attempt to quit: 04/10/2019    Years since quitting: 0.0  . Smokeless tobacco: Never Used  . Tobacco comment: trying to quit  Substance and Sexual Activity  . Alcohol use: No  . Drug use: Yes    Frequency: 7.0 times per week    Types: Marijuana    Comment: subutex  . Sexual activity: Yes    Birth control/protection: None    Comment: none  Lifestyle  . Physical activity:    Days per week: Not on file    Minutes per session: Not on file  . Stress: Not on file   Relationships  . Social connections:    Talks on phone: Not on file    Gets together: Not on file    Attends religious service: Not on file    Active member of club or organization: Not on file    Attends meetings of clubs or organizations: Not on file    Relationship status: Not on file  Other Topics Concern  . Not on file  Social History Narrative  . Not on file     FAMILY HISTORY:  We obtained a detailed, 4-generation family history.  Significant diagnoses are listed below: Family History  Problem Relation Age of Onset  . Kidney disease Maternal Grandmother   . Breast cancer Maternal Grandmother 28       metastatic to liver; d. 55  . Kidney disease Maternal Grandfather   . Kidney disease Paternal Grandmother   . Diabetes Paternal Grandmother   . Breast cancer Paternal Grandmother   . Kidney disease Paternal Grandfather   . Breast cancer Paternal Aunt   . Cerebral palsy Child   . Breast cancer Paternal Great-grandmother   . Pancreatic cancer Other        PGMs brother   . Throat cancer Paternal Uncle   . Breast cancer Other        PGMs sister    The patient has two daughters and a son who are all cancer free.  She has two full sisters, one maternal half sister and two paternal half sisters, all who are cancer free.  Both parents are living.  The patient's mothe rhas a full brother and sister, and two maternal half sisters and two maternal half brothers, all who are cancer free.  Her maternal grandfather is living but her grandmother developed breast cancer at 65 and died at 39.  There is no other reported cancer history on the maternal side.  The patient's father has two full brothers and three paternal half brothers all are cancer free.  His father is living and his mother is deceased.  His mother, her sister and her mother all had breat cancer.  His mother's brother had pancreatic cancer.  Terri Estes is unaware of previous family history of genetic testing for  hereditary cancer risks. Patient's maternal ancestors are of African American descent, and paternal ancestors are of African American descent. There is no reported Ashkenazi Jewish ancestry. There is no known consanguinity.  GENETIC COUNSELING ASSESSMENT: Terri Estes is a 33 y.o. female with a personal and family history of breast cancer which is somewhat suggestive of a hereditary breast cancer syndrome and predisposition to cancer. We, therefore, discussed and recommended the following at today's visit.   DISCUSSION: We discussed that 5 - 10% of breast cancer is hereditary, with most cases associated with BRCA mutations.  There are other genes that can be associated with hereditary breast cancer syndromes.  These include ATM, CHEK2 and PALB2.  Based on her family history, we are most concerned about the paternal side of the family, where there is two generations of breast cancer and a faily memer with pancratic cancer.  Her father's generation is only female.  While her maternal side of the family has a grandmother with breast cancer at 89, her mother has three sisters who are all over 87 and do not have cancer.  We discussed that testing is beneficial for several reasons including knowing how to follow individuals after completing their treatment, identifying whether potential treatment options such as PARP inhibitors would be beneficial, and understand if other family members could be at risk for cancer and allow them to undergo genetic testing.   We reviewed the characteristics, features and inheritance patterns of hereditary cancer syndromes. We also discussed genetic testing, including the appropriate family members to test, the process of testing, insurance coverage and turn-around-time for results. We discussed the implications of a negative, positive and/or variant of uncertain significant result. In order to get genetic test results in a timely manner so that Terri Estes can use these genetic test  results for surgical decisions, we recommended Terri Estes pursue genetic testing for the common hereditary  cancer gene panel. The Common Hereditary Gene Panel offered by [facility] includes sequencing and/or deletion duplication testing of the following 48 genes: APC, ATM, AXIN2, BARD1, BMPR1A, BRCA1, BRCA2, BRIP1, CDH1, CDK4, CDKN2A (p14ARF), CDKN2A (p16INK4a), CHEK2, CTNNA1, DICER1, EPCAM (Deletion/duplication testing only), GREM1 (promoter region deletion/duplication testing only), KIT, MEN1, MLH1, MSH2, MSH3, MSH6, MUTYH, NBN, NF1, NHTL1, PALB2, PDGFRA, PMS2, POLD1, POLE, PTEN, RAD50, RAD51C, RAD51D, RNF43, SDHB, SDHC, SDHD, SMAD4, SMARCA4. STK11, TP53, TSC1, TSC2, and VHL.  The following genes were evaluated for sequence changes only: SDHA and HOXB13 c.251G>A variant only.   Based on Terri Estes's personal and family history of cancer, she meets medical criteria for genetic testing. Despite that she meets criteria, she may still have an out of pocket cost.   PLAN: After considering the risks, benefits, and limitations, Terri Estes provided informed consent to pursue genetic testing and the blood sample was [facility] to [facility] for analysis of the common hereditary cancer syndrome. Results should be available within approximately 2-3 weeks' time, at which point they will be disclosed by telephone to Terri Estes, as will any additional recommendations warranted by these results. Terri Estes will receive a summary of her genetic counseling visit and a copy of her results once available. This information will also be available in Epic.   Lastly, we encouraged Terri Estes to remain in contact with cancer genetics annually so that we can continuously update the family history and inform her of any changes in cancer genetics and testing that may be of benefit for this family.   Terri Estes questions were answered to her satisfaction today. Our contact information was provided should additional  questions or concerns arise. Thank you for the referral and allowing [place] to share in the care of your patient.   Cheyna Retana P. Florene Glen, [place], [facility] Certified Genetic Counselor [email or web address] phone: [phone]  The patient was seen for a total of 60 minutes in face-to-face genetic counseling.  This patient was discussed with Drs. Magrinat, Lindi Adie and/or Burr Medico who agrees with the above.    _______________________________________________________________________ For Office Staff:  Number of people involved in session: 1 Was an Intern/ student involved with case: no

## 2019-05-03 ENCOUNTER — Inpatient Hospital Stay: Payer: Medicaid Other

## 2019-05-03 ENCOUNTER — Telehealth: Payer: Self-pay | Admitting: *Deleted

## 2019-05-03 ENCOUNTER — Other Ambulatory Visit: Payer: Self-pay

## 2019-05-03 ENCOUNTER — Encounter: Payer: Self-pay | Admitting: Oncology

## 2019-05-03 VITALS — BP 99/66 | HR 87 | Temp 99.1°F | Resp 17

## 2019-05-03 DIAGNOSIS — C50811 Malignant neoplasm of overlapping sites of right female breast: Secondary | ICD-10-CM

## 2019-05-03 DIAGNOSIS — Z5111 Encounter for antineoplastic chemotherapy: Secondary | ICD-10-CM | POA: Diagnosis not present

## 2019-05-03 DIAGNOSIS — Z171 Estrogen receptor negative status [ER-]: Principal | ICD-10-CM

## 2019-05-03 MED ORDER — SODIUM CHLORIDE 0.9% FLUSH
10.0000 mL | INTRAVENOUS | Status: DC | PRN
  Administered 2019-05-03: 16:00:00 10 mL
  Filled 2019-05-03: qty 10

## 2019-05-03 MED ORDER — HEPARIN SOD (PORK) LOCK FLUSH 100 UNIT/ML IV SOLN
500.0000 [IU] | Freq: Once | INTRAVENOUS | Status: AC | PRN
  Administered 2019-05-03: 16:00:00 500 [IU]
  Filled 2019-05-03: qty 5

## 2019-05-03 MED ORDER — SODIUM CHLORIDE 0.9 % IV SOLN
200.0000 mg | Freq: Once | INTRAVENOUS | Status: AC
  Administered 2019-05-03: 15:00:00 200 mg via INTRAVENOUS
  Filled 2019-05-03: qty 8

## 2019-05-03 NOTE — Telephone Encounter (Signed)
Attempted to call patient x2.  Left message for a return phone call to assess needs and make sure she is aware of her appointments.

## 2019-05-03 NOTE — Patient Instructions (Signed)
Coronavirus (COVID-19) Are you at risk?  Are you at risk for the Coronavirus (COVID-19)?  To be considered HIGH RISK for Coronavirus (COVID-19), you have to meet the following criteria:  . Traveled to [place], [place], [place], [place] or [place]; or in the [place] to [place], [place], [place], or [place]; and have fever, cough, and shortness of breath within the last 2 weeks of travel OR . Been in close contact with a person diagnosed with COVID-19 within the last 2 weeks and have fever, cough, and shortness of breath . IF YOU DO NOT MEET THESE CRITERIA, YOU ARE CONSIDERED LOW RISK FOR COVID-19.  What to do if you are HIGH RISK for COVID-19?  . If you are having a medical emergency, call 911. . Seek medical care right away. Before you go to a doctor's office, urgent care or emergency department, call ahead and tell them about your recent travel, contact with someone diagnosed with COVID-19, and your symptoms. You should receive instructions from your physician's office regarding next steps of care.  . When you arrive at healthcare provider, tell the healthcare staff immediately you have returned from visiting [place], [place], [place], [place] or [place]; or traveled in the [place] to [place], [place], [place], or [place]; in the last two weeks or you have been in close contact with a person diagnosed with COVID-19 in the last 2 weeks.   . Tell the health care staff about your symptoms: fever, cough and shortness of breath. . After you have been seen by a medical provider, you will be either: o Tested for (COVID-19) and discharged home on quarantine except to seek medical care if symptoms worsen, and asked to  - Stay home and avoid contact with others until you get your results (4-5 days)  - Avoid travel on public transportation if possible (such as bus, train, or airplane) or o Sent to the Emergency Department by EMS for evaluation, COVID-19 testing, and possible  admission depending on your condition and test results.  What to do if you are LOW RISK for COVID-19?  Reduce your risk of any infection by using the same precautions used for avoiding the common cold or flu:  . Wash your hands often with soap and warm water for at least 20 seconds.  If soap and water are not readily available, use an alcohol-based hand sanitizer with at least 60% alcohol.  . If coughing or sneezing, cover your mouth and nose by coughing or sneezing into the elbow areas of your shirt or coat, into a tissue or into your sleeve (not your hands). . Avoid shaking hands with others and consider head nods or verbal greetings only. . Avoid touching your eyes, nose, or mouth with unwashed hands.  . Avoid close contact with people who are sick. . Avoid places or events with large numbers of people in one location, like concerts or sporting events. . Carefully consider travel plans you have or are making. . If you are planning any travel outside or inside the [place], visit the CDC's Travelers' Health webpage for the latest health notices. . If you have some symptoms but not all symptoms, continue to monitor at home and seek medical attention if your symptoms worsen. . If you are having a medical emergency, call 911.   ADDITIONAL HEALTHCARE OPTIONS FOR PATIENTS  [facility] / e-Visit: [email or web address]         [facility]: [phone]  Onset   Urgent Care: [facility]: [facility] Discharge Instructions for Patients Receiving Chemotherapy  Today you received the following chemotherapy agents Keytruda  To help prevent nausea and vomiting after your treatment, we encourage you to take your nausea medication: As directed by your MD.   If you develop nausea and vomiting that is not controlled by your nausea medication, call the clinic.    BELOW ARE SYMPTOMS THAT SHOULD BE REPORTED IMMEDIATELY:  *FEVER GREATER THAN 100.5 F  *CHILLS WITH OR WITHOUT FEVER  NAUSEA AND VOMITING THAT IS NOT CONTROLLED WITH YOUR NAUSEA MEDICATION  *UNUSUAL SHORTNESS OF BREATH  *UNUSUAL BRUISING OR BLEEDING  TENDERNESS IN MOUTH AND THROAT WITH OR WITHOUT PRESENCE OF ULCERS  *URINARY PROBLEMS  *BOWEL PROBLEMS  UNUSUAL RASH Items with * indicate a potential emergency and should be followed up as soon as possible.  Feel free to call the clinic should you have any questions or concerns. The clinic phone number is [phone].  Please show the [place] at check-in to the Emergency Department and triage nurse.  Pembrolizumab injection What is this medicine? PEMBROLIZUMAB (pem broe liz ue mab) is a monoclonal antibody. It is used to treat cervical cancer, esophageal cancer, head and neck cancer, hepatocellular cancer, Hodgkin lymphoma, kidney cancer, lymphoma, melanoma, Merkel cell carcinoma, lung cancer, stomach cancer, urothelial cancer, and cancers that have a certain genetic condition. This medicine may be used for other purposes; ask your health care provider or pharmacist if you have questions. COMMON BRAND NAME(S): Keytruda What should I tell my health care provider before I take this medicine? They need to know if you have any of these conditions: -diabetes -immune system problems -inflammatory bowel disease -liver disease -lung or breathing disease -lupus -received or scheduled to receive an organ transplant or a stem-cell transplant that uses donor stem cells -an unusual or allergic reaction to pembrolizumab, other medicines, foods, dyes, or preservatives -pregnant or trying to get pregnant -breast-feeding How should I use this medicine? This medicine is for infusion into a vein. It is given by a health care professional in a hospital or clinic setting. A special MedGuide will be given to you before each  treatment. Be sure to read this information carefully each time. Talk to your pediatrician regarding the use of this medicine in children. While this drug may be prescribed for selected conditions, precautions do apply. Overdosage: If you think you have taken too much of this medicine contact a poison control center or emergency room at once. NOTE: This medicine is only for you. Do not share this medicine with others. What if I miss a dose? It is important not to miss your dose. Call your doctor or health care professional if you are unable to keep an appointment. What may interact with this medicine? Interactions have not been studied. Give your health care provider a list of all the medicines, herbs, non-prescription drugs, or dietary supplements you use. Also tell them if you smoke, drink alcohol, or use illegal drugs. Some items may interact with your medicine. This list may not describe all possible interactions. Give your health care provider a list of all the medicines, herbs, non-prescription drugs, or dietary supplements you use. Also tell them if you smoke, drink alcohol, or use illegal drugs. Some items may interact with your medicine. What should I watch for while  using this medicine? Your condition will be monitored carefully while you are receiving this medicine. You may need blood work done while you are taking this medicine. Do not become pregnant while taking this medicine or for 4 months after stopping it. Women should inform their doctor if they wish to become pregnant or think they might be pregnant. There is a potential for serious side effects to an unborn child. Talk to your health care professional or pharmacist for more information. Do not breast-feed an infant while taking this medicine or for 4 months after the last dose. What side effects may I notice from receiving this medicine? Side effects that you should report to your doctor or health care professional as soon as  possible: -allergic reactions like skin rash, itching or hives, swelling of the face, lips, or tongue -bloody or black, tarry -breathing problems -changes in vision -chest pain -chills -confusion -constipation -cough -diarrhea -dizziness or feeling faint or lightheaded -fast or irregular heartbeat -fever -flushing -hair loss -joint pain -low blood counts - this medicine may decrease the number of white blood cells, red blood cells and platelets. You may be at increased risk for infections and bleeding. -muscle pain -muscle weakness -persistent headache -redness, blistering, peeling or loosening of the skin, including inside the mouth -signs and symptoms of high blood sugar such as dizziness; dry mouth; dry skin; fruity breath; nausea; stomach pain; increased hunger or thirst; increased urination -signs and symptoms of kidney injury like trouble passing urine or change in the amount of urine -signs and symptoms of liver injury like dark urine, light-colored stools, loss of appetite, nausea, right upper belly pain, yellowing of the eyes or skin -sweating -swollen lymph nodes -weight loss Side effects that usually do not require medical attention (report to your doctor or health care professional if they continue or are bothersome): -decreased appetite -muscle pain -tiredness This list may not describe all possible side effects. Call your doctor for medical advice about side effects. You may report side effects to FDA at 1-800-FDA-1088. Where should I keep my medicine? This drug is given in a hospital or clinic and will not be stored at home. NOTE: This sheet is a summary. It may not cover all possible information. If you have questions about this medicine, talk to your doctor, pharmacist, or health care provider.  2019 Elsevier/Gold Standard (2018-07-27 15:06:10)

## 2019-05-03 NOTE — Progress Notes (Signed)
Checked on pt earlier today to see if she had any questions/concerns about education & questions answered.

## 2019-05-03 NOTE — Progress Notes (Signed)
Met w/ pt and introduced myself as her Arboriculturist.  Pt's ins should pay for her treatment at 100% so copay assistance isn't needed.  I informed her of the [employer], went over what it covers, gave her the income requirement and an expense sheet.  Pt provided proof of income and was approved for the $1000 [employer].  She also had concerns about housing so I gave her Ann's (Education officer, museum) card to contact for assistance with that.  She also has my card for any questions or concerns she may have in the future.

## 2019-05-07 ENCOUNTER — Telehealth: Payer: Self-pay | Admitting: *Deleted

## 2019-05-07 ENCOUNTER — Encounter: Payer: Self-pay | Admitting: Pharmacy Technician

## 2019-05-07 NOTE — Progress Notes (Signed)
The patient is approved for drug assistance by [employer] for [employer].  Enrollment is effective until 05/06/20 and is based on off label use. Drug replacement will begin on DOS 05/03/19.

## 2019-05-07 NOTE — Telephone Encounter (Signed)
Spoke to pt regarding anti-nausea medication/decadron. Gave instructions and received read back on how to take. Pt discussed her grandmother was recently dx with [phone]. Pt hasn't been around grandmother since 04/28/19. Both pt and grandmother wore masks. Pt does not have any symptoms. Spoke with Threasa Beards AD and informed of grandmother dx, pt cleared to receive tx on 5/12. Pt instructed to wear mask. Denies further questions or needs at this time.

## 2019-05-07 NOTE — Telephone Encounter (Signed)
-----   Message from Lester Seltzer, RN sent at 05/03/2019  4:24 PM EDT ----- Regarding: Dr. Fredric Dine First time follow up Pt, received first time Keytruda and tolerated well. Please clarify schedule and treatment plan as it seems to be different from each other. Thank you.

## 2019-05-07 NOTE — Telephone Encounter (Signed)
TCT patient to follow up with her after her 1st time Keytruda on 05/03/19.  Spoke with patient. She states she feels well overall-just c/o feeling tired.  She is due to come tomorrow for labs/port flush/appt with Thedore Mins, NP and her 1st carbo/taxol treatment.  She did have several questions related to her schedule, when her next Beryle Flock is, will she be getting neulasta, etc.  She got confused about taking the dexamthasone and she took it before and after her Keytruda last week, when normally it is taken. before and after chemotherapy. Instructed on taking it this week. Patient voiced understanding. Of note-pt's med list needs to be updated @ tomorrow's visit. Pt states she will bring her medications with her. Pt will need an updated treatment calendar as well.  She has 3 children at home-all with special needs. It takes a lot of schedule coordination for this family. Pt also asking about applying for disability. She has [name], MSW business card. And she will call her.

## 2019-05-08 ENCOUNTER — Telehealth: Payer: Self-pay | Admitting: *Deleted

## 2019-05-08 ENCOUNTER — Encounter: Payer: Self-pay | Admitting: General Practice

## 2019-05-08 ENCOUNTER — Inpatient Hospital Stay: Payer: Medicaid Other

## 2019-05-08 ENCOUNTER — Other Ambulatory Visit: Payer: Self-pay

## 2019-05-08 ENCOUNTER — Inpatient Hospital Stay (HOSPITAL_BASED_OUTPATIENT_CLINIC_OR_DEPARTMENT_OTHER): Payer: Medicaid Other | Admitting: Adult Health

## 2019-05-08 ENCOUNTER — Encounter: Payer: Self-pay | Admitting: Adult Health

## 2019-05-08 VITALS — BP 99/73 | HR 73 | Temp 98.4°F | Resp 17

## 2019-05-08 VITALS — BP 118/76 | HR 91 | Temp 97.5°F | Resp 20 | Ht 60.0 in | Wt 197.8 lb

## 2019-05-08 DIAGNOSIS — Z803 Family history of malignant neoplasm of breast: Secondary | ICD-10-CM

## 2019-05-08 DIAGNOSIS — Z79899 Other long term (current) drug therapy: Secondary | ICD-10-CM

## 2019-05-08 DIAGNOSIS — Z5111 Encounter for antineoplastic chemotherapy: Secondary | ICD-10-CM

## 2019-05-08 DIAGNOSIS — G8929 Other chronic pain: Secondary | ICD-10-CM

## 2019-05-08 DIAGNOSIS — C50411 Malignant neoplasm of upper-outer quadrant of right female breast: Secondary | ICD-10-CM

## 2019-05-08 DIAGNOSIS — Z171 Estrogen receptor negative status [ER-]: Secondary | ICD-10-CM

## 2019-05-08 DIAGNOSIS — F1220 Cannabis dependence, uncomplicated: Secondary | ICD-10-CM

## 2019-05-08 DIAGNOSIS — E860 Dehydration: Secondary | ICD-10-CM

## 2019-05-08 DIAGNOSIS — R197 Diarrhea, unspecified: Secondary | ICD-10-CM

## 2019-05-08 DIAGNOSIS — Z7951 Long term (current) use of inhaled steroids: Secondary | ICD-10-CM

## 2019-05-08 DIAGNOSIS — R609 Edema, unspecified: Secondary | ICD-10-CM

## 2019-05-08 DIAGNOSIS — Z802 Family history of malignant neoplasm of other respiratory and intrathoracic organs: Secondary | ICD-10-CM

## 2019-05-08 DIAGNOSIS — R590 Localized enlarged lymph nodes: Secondary | ICD-10-CM

## 2019-05-08 DIAGNOSIS — I2510 Atherosclerotic heart disease of native coronary artery without angina pectoris: Secondary | ICD-10-CM

## 2019-05-08 DIAGNOSIS — Z95828 Presence of other vascular implants and grafts: Secondary | ICD-10-CM

## 2019-05-08 DIAGNOSIS — Z5112 Encounter for antineoplastic immunotherapy: Secondary | ICD-10-CM

## 2019-05-08 DIAGNOSIS — Z841 Family history of disorders of kidney and ureter: Secondary | ICD-10-CM

## 2019-05-08 DIAGNOSIS — C50811 Malignant neoplasm of overlapping sites of right female breast: Secondary | ICD-10-CM

## 2019-05-08 DIAGNOSIS — Z833 Family history of diabetes mellitus: Secondary | ICD-10-CM

## 2019-05-08 DIAGNOSIS — L409 Psoriasis, unspecified: Secondary | ICD-10-CM

## 2019-05-08 DIAGNOSIS — C50211 Malignant neoplasm of upper-inner quadrant of right female breast: Secondary | ICD-10-CM | POA: Diagnosis not present

## 2019-05-08 DIAGNOSIS — R918 Other nonspecific abnormal finding of lung field: Secondary | ICD-10-CM

## 2019-05-08 DIAGNOSIS — Z87891 Personal history of nicotine dependence: Secondary | ICD-10-CM

## 2019-05-08 DIAGNOSIS — R234 Changes in skin texture: Secondary | ICD-10-CM

## 2019-05-08 DIAGNOSIS — M542 Cervicalgia: Secondary | ICD-10-CM

## 2019-05-08 DIAGNOSIS — Z800 Family history of malignant neoplasm of digestive organs: Secondary | ICD-10-CM

## 2019-05-08 DIAGNOSIS — Z820 Family history of epilepsy and other diseases of the nervous system: Secondary | ICD-10-CM

## 2019-05-08 DIAGNOSIS — Z72820 Sleep deprivation: Secondary | ICD-10-CM

## 2019-05-08 DIAGNOSIS — K219 Gastro-esophageal reflux disease without esophagitis: Secondary | ICD-10-CM

## 2019-05-08 LAB — CBC WITH DIFFERENTIAL/PLATELET
Abs Immature Granulocytes: 0.03 10*3/uL (ref 0.00–0.07)
Basophils Absolute: 0 10*3/uL (ref 0.0–0.1)
Basophils Relative: 0 %
Eosinophils Absolute: 0.2 10*3/uL (ref 0.0–0.5)
Eosinophils Relative: 2 %
HCT: 34.1 % — ABNORMAL LOW (ref 36.0–46.0)
Hemoglobin: 10.9 g/dL — ABNORMAL LOW (ref 12.0–15.0)
Immature Granulocytes: 0 %
Lymphocytes Relative: 36 %
Lymphs Abs: 3.6 10*3/uL (ref 0.7–4.0)
MCH: 29.6 pg (ref 26.0–34.0)
MCHC: 32 g/dL (ref 30.0–36.0)
MCV: 92.7 fL (ref 80.0–100.0)
Monocytes Absolute: 0.8 10*3/uL (ref 0.1–1.0)
Monocytes Relative: 8 %
Neutro Abs: 5.2 10*3/uL (ref 1.7–7.7)
Neutrophils Relative %: 54 %
Platelets: 328 10*3/uL (ref 150–400)
RBC: 3.68 MIL/uL — ABNORMAL LOW (ref 3.87–5.11)
RDW: 14.4 % (ref 11.5–15.5)
WBC: 9.9 10*3/uL (ref 4.0–10.5)
nRBC: 0 % (ref 0.0–0.2)

## 2019-05-08 LAB — COMPREHENSIVE METABOLIC PANEL
ALT: 31 U/L (ref 0–44)
AST: 17 U/L (ref 15–41)
Albumin: 3.4 g/dL — ABNORMAL LOW (ref 3.5–5.0)
Alkaline Phosphatase: 98 U/L (ref 38–126)
Anion gap: 9 (ref 5–15)
BUN: 16 mg/dL (ref 6–20)
CO2: 25 mmol/L (ref 22–32)
Calcium: 8.5 mg/dL — ABNORMAL LOW (ref 8.9–10.3)
Chloride: 106 mmol/L (ref 98–111)
Creatinine, Ser: 0.88 mg/dL (ref 0.44–1.00)
GFR calc Af Amer: >60 mL/min (ref >60)
GFR calc non Af Amer: >60 mL/min (ref >60)
Glucose, Bld: 109 mg/dL — ABNORMAL HIGH (ref 70–99)
Potassium: 3.2 mmol/L — ABNORMAL LOW (ref 3.5–5.1)
Sodium: 140 mmol/L (ref 135–145)
Total Bilirubin: <0.2 mg/dL — ABNORMAL LOW (ref 0.3–1.2)
Total Protein: 6.9 g/dL (ref 6.5–8.1)

## 2019-05-08 LAB — TSH: TSH: 1.889 u[IU]/mL (ref 0.308–3.960)

## 2019-05-08 MED ORDER — SODIUM CHLORIDE 0.9 % IV SOLN
80.0000 mg/m2 | Freq: Once | INTRAVENOUS | Status: AC
  Administered 2019-05-08: 12:00:00 162 mg via INTRAVENOUS
  Filled 2019-05-08: qty 27

## 2019-05-08 MED ORDER — GOSERELIN ACETATE 3.6 MG ~~LOC~~ IMPL
DRUG_IMPLANT | SUBCUTANEOUS | Status: AC
  Filled 2019-05-08: qty 3.6

## 2019-05-08 MED ORDER — DIPHENHYDRAMINE HCL 50 MG/ML IJ SOLN
25.0000 mg | Freq: Once | INTRAMUSCULAR | Status: AC
  Administered 2019-05-08: 11:00:00 25 mg via INTRAVENOUS

## 2019-05-08 MED ORDER — SODIUM CHLORIDE 0.9% FLUSH
10.0000 mL | INTRAVENOUS | Status: DC | PRN
  Filled 2019-05-08: qty 10

## 2019-05-08 MED ORDER — FAMOTIDINE IN NACL 20-0.9 MG/50ML-% IV SOLN
20.0000 mg | Freq: Once | INTRAVENOUS | Status: AC
  Administered 2019-05-08: 11:00:00 20 mg via INTRAVENOUS

## 2019-05-08 MED ORDER — SODIUM CHLORIDE 0.9% FLUSH
10.0000 mL | INTRAVENOUS | Status: DC | PRN
  Administered 2019-05-08: 10 mL via INTRAVENOUS
  Filled 2019-05-08: qty 10

## 2019-05-08 MED ORDER — HEPARIN SOD (PORK) LOCK FLUSH 100 UNIT/ML IV SOLN
500.0000 [IU] | Freq: Once | INTRAVENOUS | Status: AC | PRN
  Administered 2019-05-08: 15:00:00 500 [IU]
  Filled 2019-05-08: qty 5

## 2019-05-08 MED ORDER — SODIUM CHLORIDE 0.9 % IV SOLN
Freq: Once | INTRAVENOUS | Status: AC
  Administered 2019-05-08: 11:00:00 via INTRAVENOUS
  Filled 2019-05-08: qty 250

## 2019-05-08 MED ORDER — DIPHENHYDRAMINE HCL 50 MG/ML IJ SOLN
INTRAMUSCULAR | Status: AC
  Filled 2019-05-08: qty 1

## 2019-05-08 MED ORDER — PALONOSETRON HCL INJECTION 0.25 MG/5ML
0.2500 mg | Freq: Once | INTRAVENOUS | Status: AC
  Administered 2019-05-08: 11:00:00 0.25 mg via INTRAVENOUS

## 2019-05-08 MED ORDER — GOSERELIN ACETATE 3.6 MG ~~LOC~~ IMPL
3.6000 mg | DRUG_IMPLANT | Freq: Once | SUBCUTANEOUS | Status: AC
  Administered 2019-05-08: 15:00:00 3.6 mg via SUBCUTANEOUS

## 2019-05-08 MED ORDER — FAMOTIDINE IN NACL 20-0.9 MG/50ML-% IV SOLN
INTRAVENOUS | Status: AC
  Filled 2019-05-08: qty 50

## 2019-05-08 MED ORDER — SODIUM CHLORIDE 0.9 % IV SOLN
750.0000 mg | Freq: Once | INTRAVENOUS | Status: AC
  Administered 2019-05-08: 14:00:00 750 mg via INTRAVENOUS
  Filled 2019-05-08: qty 75

## 2019-05-08 MED ORDER — SODIUM CHLORIDE 0.9 % IV SOLN
Freq: Once | INTRAVENOUS | Status: AC
  Administered 2019-05-08: 11:00:00 via INTRAVENOUS
  Filled 2019-05-08: qty 5

## 2019-05-08 MED ORDER — SODIUM CHLORIDE 0.9% FLUSH
10.0000 mL | INTRAVENOUS | Status: DC | PRN
  Administered 2019-05-08: 15:00:00 10 mL
  Filled 2019-05-08: qty 10

## 2019-05-08 MED ORDER — PALONOSETRON HCL INJECTION 0.25 MG/5ML
INTRAVENOUS | Status: AC
  Filled 2019-05-08: qty 5

## 2019-05-08 MED ORDER — GABAPENTIN 300 MG PO CAPS: 300.0000 mg | ORAL_CAPSULE | Freq: Three times a day (TID) | ORAL | 0 refills | Status: DC

## 2019-05-08 MED ORDER — COLD PACK MISC ONCOLOGY
1.0000 | Freq: Once | Status: AC | PRN
  Administered 2019-05-08: 12:00:00 1 via TOPICAL
  Filled 2019-05-08: qty 1

## 2019-05-08 NOTE — Telephone Encounter (Signed)
Attempted to call patient but I had to leave message.  Called home number and spoke with [name] her fiance who I verified through release of information.  Reviewed patient's anti emetics with him and also reminded him of patient's appointment for tomorrow for scans.  He states patient has already stopped breast feeding and are aware of the precautions related to chemo treatments. Encouraged him to call with any questions or concerns.

## 2019-05-08 NOTE — Progress Notes (Signed)
Spoke w/ Dr. Jana Hakim and confirmed that patient will be receiving carboplatin AUC = 5 every three weeks. She is also to start goserlin today, PA team was notified of this therapy addition.   Demetrius Charity, PharmD, [facility] Pharmacist Pharmacy Phone: [phone] 05/08/2019

## 2019-05-08 NOTE — Progress Notes (Signed)
Pt going for CT Scan tomorrow preferred to stay accessed sorbaview and bio patch dressing applied.

## 2019-05-08 NOTE — Patient Instructions (Signed)
[facility] Discharge Instructions for Patients Receiving Chemotherapy  Today you received the following chemotherapy agents Taxol, Carboplatin, Zoladex  To help prevent nausea and vomiting after your treatment, we encourage you to take your nausea medication: Ad directed by MD   If you develop nausea and vomiting that is not controlled by your nausea medication, call the clinic.   BELOW ARE SYMPTOMS THAT SHOULD BE REPORTED IMMEDIATELY:  *FEVER GREATER THAN 100.5 F  *CHILLS WITH OR WITHOUT FEVER  NAUSEA AND VOMITING THAT IS NOT CONTROLLED WITH YOUR NAUSEA MEDICATION  *UNUSUAL SHORTNESS OF BREATH  *UNUSUAL BRUISING OR BLEEDING  TENDERNESS IN MOUTH AND THROAT WITH OR WITHOUT PRESENCE OF ULCERS  *URINARY PROBLEMS  *BOWEL PROBLEMS  UNUSUAL RASH Items with * indicate a potential emergency and should be followed up as soon as possible.  Feel free to call the clinic should you have any questions or concerns. The clinic phone number is [phone].  Please show the [facility] at check-in to the Emergency Department and triage nurse.  Coronavirus (COVID-19) Are you at risk?  Are you at risk for the Coronavirus (COVID-19)?  To be considered HIGH RISK for Coronavirus (COVID-19), you have to meet the following criteria:  . Traveled to [place], [place], [place], [place] or [place]; or in the [place] to [place], [place], [place], or [place]; and have fever, cough, and shortness of breath within the last 2 weeks of travel OR . Been in close contact with a person diagnosed with COVID-19 within the last 2 weeks and have fever, cough, and shortness of breath . IF YOU DO NOT MEET THESE CRITERIA, YOU ARE CONSIDERED LOW RISK FOR COVID-19.  What to do if you are HIGH RISK for COVID-19?  Marland Kitchen If you are having a medical emergency, call 911. . Seek medical care right away. Before you go to a doctor's office, urgent care or emergency department, call ahead and  tell them about your recent travel, contact with someone diagnosed with COVID-19, and your symptoms. You should receive instructions from your physician's office regarding next steps of care.  . When you arrive at healthcare provider, tell the healthcare staff immediately you have returned from visiting [place], [place], [place], [place] or [place]; or traveled in the [place] to [place], [place], [place], or [place]; in the last two weeks or you have been in close contact with a person diagnosed with COVID-19 in the last 2 weeks.   . Tell the health care staff about your symptoms: fever, cough and shortness of breath. . After you have been seen by a medical provider, you will be either: o Tested for (COVID-19) and discharged home on quarantine except to seek medical care if symptoms worsen, and asked to  - Stay home and avoid contact with others until you get your results (4-5 days)  - Avoid travel on public transportation if possible (such as bus, train, or airplane) or o Sent to the Emergency Department by EMS for evaluation, COVID-19 testing, and possible admission depending on your condition and test results.  What to do if you are LOW RISK for COVID-19?  Reduce your risk of any infection by using the same precautions used for avoiding the common cold or flu:  Marland Kitchen Wash your hands often with soap and warm water for at least 20 seconds.  If soap and water are not readily available, use an alcohol-based hand sanitizer with at least 60% alcohol.  Marland Kitchen  If coughing or sneezing, cover your mouth and nose by coughing or sneezing into the elbow areas of your shirt or coat, into a tissue or into your sleeve (not your hands). . Avoid shaking hands with others and consider head nods or verbal greetings only. . Avoid touching your eyes, nose, or mouth with unwashed hands.  . Avoid close contact with people who are sick. . Avoid places or events with large numbers of people in one location, like  concerts or sporting events. . Carefully consider travel plans you have or are making. . If you are planning any travel outside or inside the [place], visit the CDC's Travelers' Health webpage for the latest health notices. . If you have some symptoms but not all symptoms, continue to monitor at home and seek medical attention if your symptoms worsen. . If you are having a medical emergency, call 911.   [facility] / e-Visit: [email or web address]         [facility]: [facility]: [phone]                   [facility]: [phone]

## 2019-05-08 NOTE — Progress Notes (Addendum)
[place]  [phone] [phone]    ID: Terri Estes DOB: May 13, 1986  MR#: [id number]  ZVJ#:[id number]  Patient Care Team: Patient, No Pcp Per as PCP - General (General Practice) Alphonsa Overall, MD as Consulting Physician (General Surgery) Magrinat, [name], MD as Consulting Physician (Oncology) Kyung Rudd, MD as Consulting Physician (Radiation Oncology) Rockwell Germany, RN as Oncology Nurse Navigator Mauro Kaufmann, RN as Oncology Nurse Navigator Donnamae Jude, MD as Consulting Physician (Obstetrics and Gynecology) Pavelock, Ralene Bathe, MD as Consulting Physician (Internal Medicine) OTHER MD:   CHIEF COMPLAINT: Triple negative breast cancer  CURRENT TREATMENT: Neoadjuvant chemotherapy/immunotherapy   HISTORY OF CURRENT ILLNESS: From the original intake note:  Terri Estes presented with swelling along the left side of the face with neck pain. She then underwent a neck CT on 04/11/2019 showing: Enlarged left level 2 lymph nodes with associated inflammatory change including stranding of the adjacent fat and thickening of the platysma. While malignancy is not excluded, this is most concerning for infection. No focal abscess is present. Hypoattenuation within 1 of the left level 2 lymph nodes likely represents central necrosis or suppuration of the node. No primary malignancy or other abscess. Left upper lobe pulmonary nodules may be inflammatory. The largest measures 0.7 x 0.7 x 0.6 cm. CT of the chest with contrast may be useful for further evaluation.  She also underwent a chest CT on the same day showing: Complex right breast mass measuring 5.6 x 4.5 x 4.7 cm consistent with a primary breast carcinoma. Malignant right axillary adenopathy measuring up to 3.1 cm. Multiple bilateral pulmonary nodules are concerning for metastatic disease. Given the right breast mass, the left cervical lymph nodes are more likely malignant.  She then  underwent bilateral diagnostic mammography with tomography and right breast ultrasonography at [facility] on 04/19/2019 showing: Breast Density Category C; findings which are highly suspicious for multicentric inflammatory right breast cancer with right axillary nodal metastatic disease; no mammographic evidence of malignancy involving the left breast.  Accordingly on 04/19/2019 she proceeded to biopsy of the right breast area in question. The pathology from this procedure showed ([id number]): invasive ductal carcinoma, grade III, upper inner quadrant, 12:30 o'clock, 5.0 cm from the nipple. Prognostic indicators significant for: estrogen receptor, 0% negative and progesterone receptor, 0% negative. Proliferation marker Ki67 at 90%. HER2 negative (0+).  Additional biopsies of the right breast and right axilla were performed on the same day. The pathology from this procedure showed ([id number]): 2. Breast, right, needle core biopsy, satellite mass UOQ, 10 o'clock, 8cmfn  - Invasive ductal carcinoma, grade III 3. Lymph node, needle/core biopsy, level 1 right axilla   - Invasive ductal carcinoma, grade III  The patient's subsequent history is as detailed below.   INTERVAL HISTORY: [name] is here today for follow up and evaluation of her estrogen negative breast cancer. She is unaccompanied.  She is starting neoadjuvant treatment for her locally advanced breast cancer.    Terri Estes received Pembrolizumab starting last week.  She tolerated this well.  She says she took a nap, but didn't note any other issues.    Terri Estes is due to start chemotherapy today with Paclitaxel and Carboplatin.  She will receive the Paclitaxel weekly and the Carboplatin dosed at AUC 5 given every 3 weeks.  Terri Estes has brought her bags to ice her fingertips.    REVIEW OF SYSTEMS: Terri Estes has had increasing amounts of breast pain.  She notes that the area is tender  and she is having a hard time sleeping due to the  pain.  She is doing positioning techniques with a pillow to relieve pressure.  She is taking tylenol.  She has h/o substance abuse issues so she takes Subutex for this.  She previously received Tramadol from Dr. Lucia Gaskins and states that helped the pain.  Terri Estes has a 27 month old daughter.  She had been breast feeding prior to her diagnosis. She notes that her breasts feel engorged which is increasing the pain.  She wants to know if she can continue to breastfeed her daughter.    Terri Estes notes that she has psoriasis.  She is very concerned about her diagnosis and her skin with the Paclitaxel. She is applying eucerin cream to her skin daily.    Terri Estes denies any new pain, headaches, bowel/bladder issues, cough, shortness of breath, chest pain or palpitations.  A detailed ROS was otherwise non contributory today.    PAST MEDICAL HISTORY: Past Medical History:  Diagnosis Date   Anemia    after pregnancy   Anxiety    severe not taking meds   Asthma    inhaler  4x per day   Chronic pain    Family history of breast cancer    History of substance abuse ([place])    Pregnancy induced hypertension    takes medication now.   Psoriasis    Trichomonas infection    Vaginal Pap smear, abnormal      PAST SURGICAL HISTORY: Past Surgical History:  Procedure Laterality Date   CESAREAN SECTION  05/26/2008   CESAREAN SECTION N/A 07/10/2017   Procedure: CESAREAN SECTION;  Surgeon: Florian Buff, MD;  Location: [facility];  Service: Obstetrics;  Laterality: N/A;   CESAREAN SECTION N/A 09/23/2018   Procedure: CESAREAN SECTION;  Surgeon: Donnamae Jude, MD;  Location: [facility];  Service: Obstetrics;  Laterality: N/A;   COLPOSCOPY     PORTACATH PLACEMENT Left 04/30/2019   Procedure: INSERTION PORT-A-CATH WITH ULTRASOUND;  Surgeon: Alphonsa Overall, MD;  Location: [facility];  Service: General;  Laterality: Left;  Left Subclavian vein   WISDOM TOOTH EXTRACTION       FAMILY  HISTORY: Family History  Problem Relation Age of Onset   Kidney disease Maternal Grandmother    Breast cancer Maternal Grandmother 28       metastatic to liver; d. 32   Kidney disease Maternal Grandfather    Kidney disease Paternal Grandmother    Diabetes Paternal Grandmother    Breast cancer Paternal Grandmother    Kidney disease Paternal Grandfather    Breast cancer Paternal Aunt    Cerebral palsy Child    Breast cancer Paternal Great-grandmother    Pancreatic cancer Other        PGMs brother    Throat cancer Paternal Uncle    Breast cancer Other        PGMs sister   Latandra's father is living at age 59. Patients' mother is also living at age 51. The patient has 0 brothers and 5 sisters. Patient denies anyone in her family having ovarian, prostate, or pancreatic cancer. Her maternal grandmother was diagnosed with breast cancer, unsure what age. On her father's side, she reports her grandmother had cysts in her breasts, her great-aunt might have had breast cancer, and her great-grandmother was diagnosed with breast cancer at an older age.   GYNECOLOGIC HISTORY:  Patient's last menstrual period was 04/18/2019 (exact date).  She had an emergent C-section in 09/23/2018  for abruptio placenta at 30 weeks pregnancy, also has a history of eclampsia Menarche:    years old Age at first live birth: 33 years old [place] P: 3 LMP: 04/18/2019 Contraceptive: none HRT: none  Hysterectomy?: no BSO?: no   SOCIAL HISTORY: (Current as of 04/25/2019) Terri Estes is currently unemployed. She previously worked at [employer]. She is currently engaged. Fiance [name] runs a Midwife and works other odd jobs. Daughter Demetrius Charity, age 12, was abused by her father at 52 months and has cerebral palsy. Son [name], age 44, has severe asthma and is developmentally disabled. Daughter Yetta Glassman was born on 09/23/2018 prematurely and remains on oxygen at home.   ADVANCED DIRECTIVES: not in place.     HEALTH MAINTENANCE: Social History   Tobacco Use   Smoking status: Former Smoker    Packs/day: 0.50    Years: 5.00    Pack years: 2.50    Types: Cigarettes    Last attempt to quit: 04/10/2019    Years since quitting: 0.0   Smokeless tobacco: Never Used   Tobacco comment: trying to quit  Substance Use Topics   Alcohol use: No   Drug use: Yes    Frequency: 7.0 times per week    Types: Marijuana    Comment: subutex    Colonoscopy: never done  PAP: 05/10/2018  Bone density: never done Mammography: first performed following abnormal CT  No Known Allergies  Current Outpatient Medications  Medication Sig Dispense Refill   albuterol (PROVENTIL HFA;VENTOLIN HFA) 108 (90 Base) MCG/ACT inhaler Inhale 2 puffs into the lungs 4 (four) times daily. 1 Inhaler 2   buprenorphine (SUBUTEX) 2 MG SUBL SL tablet Place 4 mg under the tongue at bedtime.     buprenorphine (SUBUTEX) 8 MG SUBL SL tablet Place 8 mg under the tongue daily.      cyclobenzaprine (FLEXERIL) 10 MG tablet Take 1 tablet (10 mg total) by mouth 3 (three) times daily as needed for muscle spasms. 90 tablet 2   dexamethasone (DECADRON) 4 MG tablet Take 4 mg by mouth daily. Take 2 tablets by mouth daily on the day after chemotherapy, and then take 2 tablets twice daily for 2 days afterwards.  Take with food.     enalapril (VASOTEC) 10 MG tablet TAKE 1 TABLET BY MOUTH DAILY (Patient taking differently: Take 10 mg by mouth daily. ) 90 tablet 0   ferrous sulfate 325 (65 FE) MG tablet Take 1 tablet (325 mg total) by mouth 3 (three) times daily with meals. 90 tablet 3   Prenatal MV-Min-FA-Omega-3 (PRENATAL GUMMIES/DHA & FA) 0.4-32.5 MG CHEW Chew 2 each by mouth daily.     traMADol (ULTRAM) 50 MG tablet Take 1-2 tablets (50-100 mg total) by mouth every 6 (six) hours as needed. 12 tablet 1   No current facility-administered medications for this visit.      OBJECTIVE:   Vitals:   05/08/19 0935  BP: 118/76  Pulse: 91   Resp: 20  Temp: (!) 97.5 F (36.4 C)  SpO2: 100%     Body mass index is 38.63 kg/m.   Wt Readings from Last 3 Encounters:  05/08/19 197 lb 12.8 oz (89.7 kg)  04/26/19 195 lb 3.2 oz (88.5 kg)  04/25/19 198 lb 14.4 oz (90.2 kg)      ECOG FS:1 - Symptomatic but completely ambulatory  GENERAL: Patient is a well appearing female in no acute distress HEENT:  Sclerae anicteric.  Oropharynx clear and moist. No ulcerations or evidence  of oropharyngeal candidiasis. Neck is supple.  NODES:  No cervical, supraclavicular, or axillary lymphadenopathy palpated.  BREAST EXAM:  Right breast appears similar to below, continued enlarged breast with peau d orange appearance, slight axillary adenopathy, +TTP throughout breast.   LUNGS:  Clear to auscultation bilaterally.  No wheezes or rhonchi. HEART:  Regular rate and rhythm. No murmur appreciated. ABDOMEN:  Soft, nontender.  Positive, normoactive bowel sounds. No organomegaly palpated. MSK:  No focal spinal tenderness to palpation. Full range of motion bilaterally in the upper extremities. EXTREMITIES:  No peripheral edema.   SKIN:  Clear with no obvious rashes or skin changes. No nail dyscrasia. NEURO:  Nonfocal. Well oriented. Anxious affect.   Photo taken 04/25/2019:      LAB RESULTS:  CMP     Component Value Date/Time   NA 141 05/02/2019 1438   NA 137 05/10/2018 1535   K 3.5 05/02/2019 1438   CL 105 05/02/2019 1438   CO2 28 05/02/2019 1438   GLUCOSE 90 05/02/2019 1438   BUN 14 05/02/2019 1438   BUN 7 05/10/2018 1535   CREATININE 0.85 05/02/2019 1438   CALCIUM 9.0 05/02/2019 1438   PROT 7.4 05/02/2019 1438   PROT 7.2 05/10/2018 1535   ALBUMIN 3.6 05/02/2019 1438   ALBUMIN 4.2 05/10/2018 1535   AST 19 05/02/2019 1438   ALT 27 05/02/2019 1438   ALKPHOS 103 05/02/2019 1438   BILITOT <0.2 (L) 05/02/2019 1438   GFRNONAA >60 05/02/2019 1438   GFRAA >60 05/02/2019 1438    No results found for: TOTALPROTELP, ALBUMINELP, A1GS,  A2GS, BETS, BETA2SER, GAMS, MSPIKE, SPEI  No results found for: KPAFRELGTCHN, LAMBDASER, KAPLAMBRATIO  Lab Results  Component Value Date   WBC 9.9 05/08/2019   NEUTROABS 5.2 05/08/2019   HGB 10.9 (L) 05/08/2019   HCT 34.1 (L) 05/08/2019   MCV 92.7 05/08/2019   PLT 328 05/08/2019    '@LASTCHEMISTRY' @  No results found for: LABCA2  No components found for: JQZESP233  No results for input(s): INR in the last 168 hours.  No results found for: LABCA2  No results found for: AQT622  No results found for: QJF354  No results found for: TGY563  No results found for: CA2729  No components found for: HGQUANT  No results found for: CEA1 / No results found for: CEA1   No results found for: AFPTUMOR  No results found for: CHROMOGRNA  No results found for: PSA1  Appointment on 05/08/2019  Component Date Value Ref Range Status   WBC 05/08/2019 9.9  4.0 - 10.5 K/uL Final   RBC 05/08/2019 3.68* 3.87 - 5.11 MIL/uL Final   Hemoglobin 05/08/2019 10.9* 12.0 - 15.0 g/dL Final   HCT 05/08/2019 34.1* 36.0 - 46.0 % Final   MCV 05/08/2019 92.7  80.0 - 100.0 fL Final   MCH 05/08/2019 29.6  26.0 - 34.0 pg Final   MCHC 05/08/2019 32.0  30.0 - 36.0 g/dL Final   RDW 05/08/2019 14.4  11.5 - 15.5 % Final   Platelets 05/08/2019 328  150 - 400 K/uL Final   nRBC 05/08/2019 0.0  0.0 - 0.2 % Final   Neutrophils Relative % 05/08/2019 54  % Final   Neutro Abs 05/08/2019 5.2  1.7 - 7.7 K/uL Final   Lymphocytes Relative 05/08/2019 36  % Final   Lymphs Abs 05/08/2019 3.6  0.7 - 4.0 K/uL Final   Monocytes Relative 05/08/2019 8  % Final   Monocytes Absolute 05/08/2019 0.8  0.1 - 1.0 K/uL Final  Eosinophils Relative 05/08/2019 2  % Final   Eosinophils Absolute 05/08/2019 0.2  0.0 - 0.5 K/uL Final   Basophils Relative 05/08/2019 0  % Final   Basophils Absolute 05/08/2019 0.0  0.0 - 0.1 K/uL Final   Immature Granulocytes 05/08/2019 0  % Final   Abs Immature Granulocytes 05/08/2019  0.03  0.00 - 0.07 K/uL Final   Performed at [facility], [address] [place], [place] [address]    (this displays the last labs from the last 3 days)  No results found for: TOTALPROTELP, ALBUMINELP, A1GS, A2GS, BETS, BETA2SER, GAMS, MSPIKE, SPEI (this displays SPEP labs)  No results found for: KPAFRELGTCHN, LAMBDASER, KAPLAMBRATIO (kappa/lambda light chains)  Lab Results  Component Value Date   HGBA 97.4 12/16/2016   (Hemoglobinopathy evaluation)   No results found for: LDH  No results found for: IRON, TIBC, IRONPCTSAT (Iron and TIBC)  No results found for: FERRITIN  Urinalysis    Component Value Date/Time   COLORURINE COLORLESS (A) 08/20/2018 0704   APPEARANCEUR CLEAR 08/20/2018 0704   LABSPEC 1.003 (L) 08/20/2018 0704   PHURINE 7.0 08/20/2018 0704   GLUCOSEU NEGATIVE 08/20/2018 0704   HGBUR NEGATIVE 08/20/2018 0704   BILIRUBINUR NEGATIVE 08/20/2018 0704   KETONESUR NEGATIVE 08/20/2018 0704   PROTEINUR NEGATIVE 08/20/2018 0704   UROBILINOGEN 4.0 (H) 01/13/2018 1417   NITRITE NEGATIVE 08/20/2018 0704   LEUKOCYTESUR NEGATIVE 08/20/2018 0704     STUDIES:  Ct Soft Tissue Neck W Contrast  Result Date: 04/11/2019 CLINICAL DATA:  Swelling along the left side of the face beginning 3 days ago. Neck pain. Infection suspected. EXAM: CT NECK WITH CONTRAST TECHNIQUE: Multidetector CT imaging of the neck was performed using the standard protocol following the bolus administration of intravenous contrast. CONTRAST:  54m OMNIPAQUE IOHEXOL 300 MG/ML  SOLN COMPARISON:  None. FINDINGS: Pharynx and larynx: No focal mucosal or submucosal lesions are present. The nasopharynx is unremarkable. The soft palate is within normal limits. The tongue base and palatine tonsils are normal. The epiglottis is within normal limits. The hypopharynx is unremarkable. Vocal cords are midline and symmetrical. The trachea is within normal limits. Salivary glands: The submandibular  and parotid glands are within normal limits bilaterally. Thyroid: Normal. Lymph nodes: Enlarged posterior left level 2 lymph nodes are noted. There is inflammatory changes so seated with indistinct margins and stranding of the adjacent fat. The largest node measures up to 2 cm with a focal area of central hypoattenuation. A second enlarged node measures up to 13 mm. There is mild prominence of right level 2 nodes without the same degree of inflammation or any evidence for necrosis. Inflammatory changes are noted along the medial aspect of the left SCM and below the left parotid gland extending to the platysma. No other significant adenopathy is present. Vascular: Unremarkable Limited intracranial: Within normal limits. Visualized orbits: The globes and orbits are within normal limits. Mastoids and visualized paranasal sinuses: The paranasal sinuses and mastoid air cells are clear. Skeleton: Vertebral body heights and alignment are maintained. There is some reversal of the normal cervical lordosis. No focal lytic or blastic lesions are present. Upper chest: At least 3 left upper lobe nodules are present. The largest measures 7 x 7 x 6 mm. There is some ground-glass attenuation involving the upper lobes bilaterally. IMPRESSION: 1. Enlarged left level 2 lymph nodes with associated inflammatory change including stranding of the adjacent fat and thickening of the platysma. While malignancy is not excluded, this is most concerning  for infection. No focal abscess is present. 2. Hypoattenuation within 1 of the left level 2 lymph nodes likely represents central necrosis or suppuration of the node. 3. No primary malignancy or other abscess. 4. Left upper lobe pulmonary nodules may be inflammatory. The largest measures 7 x 7 x 6 mm. CT of the chest with contrast may be useful for further evaluation. These results were called by telephone at the time of interpretation on 04/11/2019 at 4:59 am to [place] , who verbally  acknowledged these results. Electronically Signed   By: San Morelle M.D.   On: 04/11/2019 05:02   Ct Chest W Contrast  Result Date: 04/11/2019 CLINICAL DATA:  Neck pain.  Abnormal CT of the neck.  Lung nodules. EXAM: CT CHEST WITH CONTRAST TECHNIQUE: Multidetector CT imaging of the chest was performed during intravenous contrast administration. CONTRAST:  11m OMNIPAQUE IOHEXOL 300 MG/ML  SOLN COMPARISON:  CT neck with contrast 04/11/2019. FINDINGS: Cardiovascular: The heart size is normal. Aorta and great vessel origins are normal. Pulmonary arteries are unremarkable. No significant pericardial effusion is present. Mediastinum/Nodes: No significant mediastinal or hilar adenopathy is present. A rounded right axillary lymph node measures 12 x 13 x 14 mm. Lateral right axillary mass measures 3.1 x 1.9 cm. Lungs/Pleura: Multiple bilateral pulmonary nodules are present. Two 7 mm nodules are present in the left upper lobe. Smaller nodules are present in the lower lobes bilaterally. Right upper lobe pulmonary nodules measure 2-3 mm. Upper Abdomen: Within normal limits. Musculoskeletal: Vertebral body heights and alignment are maintained. No focal lytic or blastic lesions present. A dominant right breast mass measures 5.6 x 4.5 x 4.7 cm. There is due to diffuse stranding throughout the right breast. Marked skin thickening is present about the areola extending through diameter of at least 10 cm. IMPRESSION: 1. Complex right breast mass measuring 5.6 x 4.5 x 4.7 cm consistent with a primary breast carcinoma. 2. Malignant right axillary adenopathy measuring up to 3.1 cm. 3. Multiple bilateral pulmonary nodules are concerning for metastatic disease. 4. Given the right breast mass, the left cervical lymph nodes are more likely malignant. Electronically Signed   By: CSan MorelleM.D.   On: 04/11/2019 05:51   Mr Breast Bilateral W Wo Contrast Inc Cad  Result Date: 04/30/2019 CLINICAL DATA:  33year old  breast-feeding patient recently diagnosed with grade 3 invasive ductal carcinoma of the right breast following ultrasound-guided biopsy of a dominant necrotic mass in the upper inner quadrant at 12:30 position. She was also diagnosed with grade 3 invasive ductal carcinoma of the right breast, satellite mass, upper outer quadrant 10 o'clock 8 cm from the nipple. Pathology showed invasive ductal carcinoma of a level 1 right axillary lymph node. LABS:  None obtained EXAM: BILATERAL BREAST MRI WITH AND WITHOUT CONTRAST TECHNIQUE: Multiplanar, multisequence MR images of both breasts were obtained prior to and following the intravenous administration of 9 ml of Gadavist Three-dimensional MR images were rendered by post-processing of the original MR data on an independent workstation. The three-dimensional MR images were interpreted, and findings are reported in the following complete MRI report for this study. Three dimensional images were evaluated at the independent DynaCad workstation COMPARISON:  Previous exam(s) April 19, 2019. FINDINGS: Breast composition: b. Scattered fibroglandular tissue. Background parenchymal enhancement: Moderate. Right breast: The right breast is larger and more rounded in contour than the normal-appearing left side, and has prominent vascularity. There is diffuse thickening of the skin of the right breast with patchy areas of suspicious  enhancement within the thickened skin. Skin thickness measures at least up to 1.1 cm. There is diffusely increased T2 weighted signal throughout the right breast, the skin of the right breast, and the right pectoralis muscle. A dominant centrally necrotic irregular peripherally enhancing mass that corresponds to the biopsy-proven malignancy in the 12:30 position of the right breast measures 5.2 x 4.4 x 3.8 cm. This contains biopsy clip artifact. A second biopsy clip artifact is noted in the upper-outer right breast, posterior third, corresponding to the  position of the coil shaped biopsy clip on the mammogram where a second smaller malignant mass was recently biopsied. There are multiple approximately 1 cm and smaller ring-enhancing masses along the periphery of the dominant mass, and scattered throughout all 4 quadrants of the right breast. Additionally, 2 rim-enhancing masses in the anterior right breast, medial to the nipple, appear to be within the skin, image 68/160 series number 6. In addition to the suspicious ring-enhancing masses seen in all 4 quadrants of the right breast, there are extensive areas of nonmass enhancement involving all 4 quadrants of the right breast. The right pectoralis muscle is thickened, contains abnormal T2 signal and demonstrates focal areas of suspicious enhancement, especially in its medial aspect. Left breast: No mass or abnormal enhancement. Lymph nodes: There some peripherally enhancing abnormal right axillary lymph nodes consistent with biopsy-proven metastatic lymphadenopathy. In the superior most images is a node or conglomerate of nodes with peripheral enhancement that measures 4 x 2 cm in the axial plane. There are additional smaller suspicious lymph nodes in the right axilla and right subpectoral lymphadenopathy is seen on image number 7 of series 3. There are small rim-enhancing nodules along the internal mammary chain on the right, suspicious for internal mammary chain lymphadenopathy. On image number 109/160 of series 6 there is a 1.0 cm probable lymph node adjacent to the right heart border, with smaller similar appearing nodule inferior to it that may reflect juxtaphrenic/juxtacardiac pathologic lymphadenopathy. Ancillary findings:  None. IMPRESSION: 1. Multifocal biopsy-proven malignancy in the right breast. MRI findings suggest diffuse malignant involvement throughout all 4 quadrants of the right breast, with associated malignant involvement of the skin suspected, and findings concerning for malignant involvement  of the right pectoralis muscle. 2. Right axillary, subpectoral, and internal mammary chain lymphadenopathy. Cannot exclude suspicious lymph nodes in the juxta cardiac/juxta phrenic location. The patient has a scheduled PET-CT. 3. No MRI evidence of malignancy in the left breast. RECOMMENDATION: Continued treatment planning for known right breast carcinoma. BI-RADS CATEGORY  6: Known biopsy-proven malignancy. Electronically Signed   By: Curlene Dolphin M.D.   On: 04/30/2019 16:49   Dg Chest Port 1 View  Result Date: 04/30/2019 CLINICAL DATA:  Porta catheter placement EXAM: PORTABLE CHEST 1 VIEW COMPARISON:  Chest CT April 11, 2019 FINDINGS: The mediastinal contour is normal. The heart size is probably mildly enlarged. The lung volumes are low. A left central venous line is identified with distal tip in the right atrium. There is no pneumothorax. There is enlargement of central pulmonary vessel caliber which may be due to low lung volumes. There is no focal pneumonia or pleural effusion. The visualized skeletal structures are unremarkable. IMPRESSION: Left central venous line distal tip in the right atrium. No pneumothorax. Low lung volumes. Enlargement of central pulmonary vessel caliber bilaterally which may be due to low lung volumes. Electronically Signed   By: Abelardo Diesel M.D.   On: 04/30/2019 14:14   Dg C-arm 1-60 Min-no Report  Result Date:  04/30/2019 Fluoroscopy was utilized by the requesting physician.  No radiographic interpretation.   US Breast Ltd Uni Right Inc Axilla  Result Date: 04/19/2019 CLINICAL DATA:  33 year old presenting with a large mass involving the RIGHT breast on recent CT chest 04/11/2019. The CT also demonstrated extensive RIGHT axillary lymphadenopathy and marked thickening of the skin of the RIGHT breast.This is the patient's initial baseline mammogram. Family history of breast cancer in her maternal grandmother, paternal grandmother and a paternal aunt. EXAM: DIGITAL  DIAGNOSTIC BILATERAL MAMMOGRAM WITH CAD AND TOMO ULTRASOUND RIGHT BREAST COMPARISON:  None. ACR Breast Density Category c: The breast tissue is heterogeneously dense, which may obscure small masses. FINDINGS: Tomosynthesis and synthesized full field CC and MLO views of both breasts were obtained. A large mass measuring on the order of 4.6 x 5.5 x 5.2 cm is confirmed in the UPPER INNER QUADRANT of the LEFT breast with associated architectural distortion. A satellite mass on the order of 1.2 cm is present ANTERIOR to the dominant mass. A mass on the order of 1.0 cm is present in the OUTER breast at POSTERIOR depth, visible on the CC images but not clearly visualized on the MLO images. Architectural distortion is present in the UPPER OUTER quadrant which is separate from this mass. There is marked skin thickening throughout the RIGHT breast in there is marked edema/trabecular thickening throughout the breast. A pathologic low RIGHT axillary lymph node is present which measures on the order of 3 cm. No findings suspicious for malignancy in the LEFT breast. Mammographic images were processed with CAD. On correlative physical exam, the entire RIGHT breast is edematous and extremely firm to the touch. There is evidence of peau d'orange involving the skin of the breast. Targeted RIGHT breast ultrasound is performed, showing the dominant hypoechoic mass at the 12:30 o'clock position approximately 5 cm from the nipple measuring at least 3.0 x 5.1 x 6.6 cm, with central necrosis. 2 satellite masses are present adjacent to the dominant mass at the 12:30 o'clock position approximately 4 cm from the nipple, the larger measuring approximately 1.1 x 1.3 x 1.5 cm and the smaller measuring approximately 0.6 x 1.1 x 1.3 cm; one of these masses accounts for the mammographic mass identified ANTERIOR to the dominant mass. A satellite mass is present at the 12:30 o'clock subareolar location measuring approximately 1.6 x 1.2 x 1.7 cm. A  solid mass is present at the 10 o'clock position approximately 8 cm from the nipple measuring approximately 0.7 x 0.7 x 0.8 cm, corresponding to the mass identified in the OUTER breast on mammography. There is extensive edema throughout the RIGHT breast and there is marked skin thickening throughout the RIGHT breast, measuring up to 1.3 cm in the periareolar location. Sonographic evaluation of the RIGHT axilla demonstrates 3 pathologic lymph nodes, the largest measuring approximately 2.5 x 2.0 x 2.5 cm and corresponding to the mammographic node. An adjacent node measures approximately 0.8 cm. A third node which is subpectoral in location measures approximately 1.1 cm and has cortical thickening up to 5 mm. Of note, it appeared that there were more than 3 pathologic nodes on the recent chest CT. IMPRESSION: 1. 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 2. No mammographic evidence of malignancy involving the LEFT breast. RECOMMENDATION: Ultrasound-guided core needle biopsy of the dominant RIGHT breast mass and the mass in the [place] in an attempt to confirm multicentric disease and ultrasound-guided core needle  biopsy of the largest pathologic RIGHT axillary lymph node. I have discussed the findings and recommendations with the patient. The biopsies were performed subsequently same day and are reported separately. BI-RADS CATEGORY  5: Highly suggestive of malignancy. Electronically Signed   By: Evangeline Dakin M.D.   On: 04/19/2019 17:47   Mm Diag Breast Tomo Bilateral  Result Date: 04/19/2019 CLINICAL DATA:  33 year old presenting with a large mass involving the RIGHT breast on recent CT chest 04/11/2019. The CT also demonstrated extensive RIGHT axillary lymphadenopathy and marked thickening of the skin of the RIGHT breast.This is the patient's initial baseline mammogram. Family history of breast cancer in her  maternal grandmother, paternal grandmother and a paternal aunt. EXAM: DIGITAL DIAGNOSTIC BILATERAL MAMMOGRAM WITH CAD AND TOMO ULTRASOUND RIGHT BREAST COMPARISON:  None. ACR Breast Density Category c: The breast tissue is heterogeneously dense, which may obscure small masses. FINDINGS: Tomosynthesis and synthesized full field CC and MLO views of both breasts were obtained. A large mass measuring on the order of 4.6 x 5.5 x 5.2 cm is confirmed in the UPPER INNER QUADRANT of the LEFT breast with associated architectural distortion. A satellite mass on the order of 1.2 cm is present ANTERIOR to the dominant mass. A mass on the order of 1.0 cm is present in the OUTER breast at POSTERIOR depth, visible on the CC images but not clearly visualized on the MLO images. Architectural distortion is present in the UPPER OUTER quadrant which is separate from this mass. There is marked skin thickening throughout the RIGHT breast in there is marked edema/trabecular thickening throughout the breast. A pathologic low RIGHT axillary lymph node is present which measures on the order of 3 cm. No findings suspicious for malignancy in the LEFT breast. Mammographic images were processed with CAD. On correlative physical exam, the entire RIGHT breast is edematous and extremely firm to the touch. There is evidence of peau d'orange involving the skin of the breast. Targeted RIGHT breast ultrasound is performed, showing the dominant hypoechoic mass at the 12:30 o'clock position approximately 5 cm from the nipple measuring at least 3.0 x 5.1 x 6.6 cm, with central necrosis. 2 satellite masses are present adjacent to the dominant mass at the 12:30 o'clock position approximately 4 cm from the nipple, the larger measuring approximately 1.1 x 1.3 x 1.5 cm and the smaller measuring approximately 0.6 x 1.1 x 1.3 cm; one of these masses accounts for the mammographic mass identified ANTERIOR to the dominant mass. A satellite mass is present at the  12:30 o'clock subareolar location measuring approximately 1.6 x 1.2 x 1.7 cm. A solid mass is present at the 10 o'clock position approximately 8 cm from the nipple measuring approximately 0.7 x 0.7 x 0.8 cm, corresponding to the mass identified in the OUTER breast on mammography. There is extensive edema throughout the RIGHT breast and there is marked skin thickening throughout the RIGHT breast, measuring up to 1.3 cm in the periareolar location. Sonographic evaluation of the RIGHT axilla demonstrates 3 pathologic lymph nodes, the largest measuring approximately 2.5 x 2.0 x 2.5 cm and corresponding to the mammographic node. An adjacent node measures approximately 0.8 cm. A third node which is subpectoral in location measures approximately 1.1 cm and has cortical thickening up to 5 mm. Of note, it appeared that there were more than 3 pathologic nodes on the recent chest CT. IMPRESSION: 1. 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 2. No  mammographic evidence of malignancy involving the LEFT breast. RECOMMENDATION: Ultrasound-guided core needle biopsy of the dominant RIGHT breast mass and the mass in the [place] in an attempt to confirm multicentric disease and ultrasound-guided core needle biopsy of the largest pathologic RIGHT axillary lymph node. I have discussed the findings and recommendations with the patient. The biopsies were performed subsequently same day and are reported separately. BI-RADS CATEGORY  5: Highly suggestive of malignancy. Electronically Signed   By: Evangeline Dakin M.D.   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 Pathology revealed GRADE III INVASIVE DUCTAL  CARCINOMA of the Right breast, satellite mass, upper outer quadrant, 10 o'clock, 8cm fn. Pathology revealed INVASIVE DUCTAL CARCINOMA of the level 1 Right axillary lymph node. There is no definite nodal tissue present. This was found to be concordant by Dr. Peggye Fothergill. Pathology results were discussed with the patient by telephone. The patient reported doing well after the biopsies with tenderness at the sites. Post biopsy instructions and care were reviewed and questions were answered. The patient was encouraged to call [facility] for any additional concerns. The patient was referred to [facility] on April 25, 2019. Recommendation: Bilateral breast MRI due to strong family, heterogeneously dense breasts and to exclude contralateral disease. Pathology results reported by Terie Purser, RN on 04/20/2019. Electronically Signed   By: Evangeline Dakin M.D.   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 Biopsy of the dominant 6.6 cm mass in the UPPER INNER QUADRANT and biopsy of a mass in the [place] is performed. Biopsy of the largest pathologic RIGHT axillary lymph node is also performed. EXAM: ULTRASOUND-GUIDED RIGHT BREAST CORE NEEDLE BIOPSY X 2 ULTRASOUND GUIDED CORE NEEDLE BIOPSY OF A RIGHT AXILLARY NODE COMPARISON:  Previous exam(s). FINDINGS: I met with the patient and we discussed the procedure of ultrasound-guided biopsy, including benefits and alternatives. We discussed the high likelihood of a successful procedure. We discussed the risks of the procedure, including infection, bleeding, tissue injury, clip migration, and inadequate sampling. Informed written consent was given. The usual time-out protocol was performed immediately prior to the procedure. # 1)  Lesion quadrant: Dominant mass, RIGHT UPPER INNER QUADRANT. Initially, using sterile technique with chlorhexidine as skin antisepsis, 1% lidocaine and 1% lidocaine with epinephrine as local anesthetic, under direct ultrasound visualization, a 12 gauge Bard Marquee core needle device placed through an 11 gauge introducer needle was used to perform biopsy of the dominant mass in the [place] using a LATERAL approach. At the conclusion of the procedure a ribbon shaped tissue marker clip was deployed into the biopsy cavity. # 2) Lesion quadrant: UPPER OUTER QUADRANT. Next, using sterile technique with chlorhexidine as skin antisepsis, 1% lidocaine and 1% lidocaine with epinephrine as local anesthetic, under direct ultrasound visualization, a 12 gauge Bard Marquee core needle device placed through an 11 gauge introducer needle was used perform biopsy of the mass in the [place] using a LATERAL approach. At the conclusion of the procedure, a coil shaped tissue marker clip was deployed into the biopsy cavity. # 3) Level 1 RIGHT  axillary node: Using sterile technique with chlorhexidine as skin antisepsis, 1% lidocaine and 1% lidocaine with epinephrine as local anesthetic, under direct ultrasound visualization, a 14 gauge Bard Marquee core needle device placed through a 13 gauge introducer needle was used perform biopsy of the largest pathologic RIGHT axillary lymph node using an inferolateral approach. At the conclusion of the procedure, a HydroMark spiral shaped tissue marker clip was deployed into the biopsy cavity. Follow up 2 view mammogram was performed and dictated separately. IMPRESSION: Ultrasound guided biopsy of 2 RIGHT breast masses and a pathologic RIGHT axillary lymph node. No apparent complications. Electronically Signed: By: Evangeline Dakin M.D. On: 04/20/2019 07:33   Mm Clip Placement Right  Result Date: 04/20/2019 CLINICAL DATA:  Confirmation of clip placement after  ultrasound-guided core needle biopsy of 2 RIGHT breast masses and a pathologic RIGHT axillary lymph node. EXAM: 3D TOMOSYNTHESIS DIAGNOSTIC RIGHT MAMMOGRAM POST ULTRASOUND BIOPSY COMPARISON:  Previous exam(s). FINDINGS: 3D tomosynthesis images were obtained following ultrasound-guided biopsy of 2 RIGHT breast masses and a pathologic RIGHT axillary lymph node. The ribbon shaped tissue marker clip is appropriately position within the dominant mass in the [place] along its superomedial margin. The coil shaped tissue marker clip is appropriately position within the biopsied mass in the [place] at POSTERIOR depth. [facility] spiral shaped tissue marker clip is appropriately position biopsied pathologic lymph node in the low RIGHT axilla. Expected post biopsy changes are present at each site without evidence of hematoma. IMPRESSION: 1. Appropriate positioning of the ribbon shaped tissue marker clip within the biopsied dominant mass in the [place] along its superomedial margin. 2. Appropriate positioning of the coil shaped tissue marker clip within the biopsied mass in the [place] at POSTERIOR depth. 3. Appropriate positioning of the HydroMark spiral shaped tissue marker clip within the biopsied pathologic level 1 axillary lymph node. Of note, the ribbon shaped clip and coil shaped clip are approximately 6 cm apart. Final Assessment: Post Procedure Mammograms for Marker Placement Electronically Signed   By: Evangeline Dakin M.D.   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 Pathology  revealed INVASIVE DUCTAL CARCINOMA of the level 1 Right axillary lymph node. There is no definite nodal tissue present. This was found to be concordant by Dr. Peggye Fothergill. Pathology results were discussed with the patient by telephone. The patient reported doing well after the biopsies with tenderness at the sites. Post biopsy instructions and care were reviewed and questions were answered. The patient was encouraged to call [facility] for any additional concerns. The patient was referred to [facility] on April 25, 2019. Recommendation: Bilateral breast MRI due to strong family, heterogeneously dense breasts and to exclude contralateral disease. Pathology results reported by Terie Purser, RN on 04/20/2019. Electronically Signed   By: Evangeline Dakin M.D.   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 Biopsy of the dominant 6.6 cm mass in the  UPPER INNER QUADRANT and biopsy of a mass in the [place] is performed. Biopsy of the largest pathologic RIGHT axillary lymph node is also performed. EXAM: ULTRASOUND-GUIDED RIGHT BREAST CORE NEEDLE BIOPSY X 2 ULTRASOUND GUIDED CORE NEEDLE BIOPSY OF A RIGHT AXILLARY NODE COMPARISON:  Previous exam(s). FINDINGS: I met with the patient and we discussed the procedure of ultrasound-guided biopsy, including benefits and alternatives. We discussed the high likelihood of a successful procedure. We discussed the risks of the procedure, including infection, bleeding, tissue injury, clip migration, and inadequate sampling. Informed written consent was given. The usual time-out protocol was performed immediately prior to the procedure. # 1) Lesion quadrant: Dominant mass, RIGHT UPPER INNER QUADRANT. Initially, using sterile technique with  chlorhexidine as skin antisepsis, 1% lidocaine and 1% lidocaine with epinephrine as local anesthetic, under direct ultrasound visualization, a 12 gauge Bard Marquee core needle device placed through an 11 gauge introducer needle was used to perform biopsy of the dominant mass in the [place] using a LATERAL approach. At the conclusion of the procedure a ribbon shaped tissue marker clip was deployed into the biopsy cavity. # 2) Lesion quadrant: UPPER OUTER QUADRANT. Next, using sterile technique with chlorhexidine as skin antisepsis, 1% lidocaine and 1% lidocaine with epinephrine as local anesthetic, under direct ultrasound visualization, a 12 gauge Bard Marquee core needle device placed through an 11 gauge introducer needle was used perform biopsy of the mass in the [place] using a LATERAL approach. At the conclusion of the procedure, a coil shaped tissue marker clip was deployed into the biopsy cavity. # 3) Level 1 RIGHT axillary node: Using sterile technique with chlorhexidine as skin antisepsis, 1% lidocaine and 1% lidocaine with epinephrine as local anesthetic, under direct ultrasound visualization, a 14 gauge Bard Marquee core needle device placed through a 13 gauge introducer needle was used perform biopsy of the largest pathologic RIGHT axillary lymph node using an inferolateral approach. At the conclusion of the procedure, a HydroMark spiral shaped tissue marker clip was deployed into the biopsy cavity. Follow up 2 view mammogram was performed and dictated separately. IMPRESSION: Ultrasound guided biopsy of 2 RIGHT breast masses and a pathologic RIGHT axillary lymph node. No apparent complications. Electronically Signed: By: Evangeline Dakin M.D. 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 Pathology revealed INVASIVE DUCTAL CARCINOMA of the level 1 Right axillary lymph node. There is no definite nodal tissue present. This was found to be concordant by Dr. Peggye Fothergill. Pathology results were discussed with the patient by telephone. The patient reported doing well after the biopsies with tenderness at the sites. Post biopsy instructions and care were reviewed and questions were answered. The patient was encouraged to call [facility] for any additional concerns. The patient was referred to [facility] at [facility] on April 25, 2019. Recommendation: Bilateral breast MRI due to strong family, heterogeneously dense breasts and to exclude contralateral disease. Pathology results reported by Terie Purser, RN on 04/20/2019. Electronically Signed   By: Evangeline Dakin M.D.   On: 04/20/2019  14:31   Result Date: 04/20/2019 CLINICAL DATA:  33 year old with likely multicentric inflammatory RIGHT breast cancer and pathologic RIGHT axillary lymph nodes. Biopsy of the dominant 6.6 cm mass in the UPPER INNER QUADRANT and biopsy of a mass in the [place] is performed. Biopsy of the largest pathologic RIGHT axillary lymph node is also performed. EXAM: ULTRASOUND-GUIDED RIGHT BREAST CORE NEEDLE BIOPSY X 2 ULTRASOUND GUIDED CORE NEEDLE BIOPSY OF A RIGHT AXILLARY NODE COMPARISON:  Previous exam(s). FINDINGS: I met with the patient and we discussed the procedure of ultrasound-guided biopsy, including benefits and alternatives. We discussed the high likelihood of a successful procedure. We discussed the risks of the procedure, including infection, bleeding, tissue injury, clip migration, and inadequate sampling.  Informed written consent was given. The usual time-out protocol was performed immediately prior to the procedure. # 1) Lesion quadrant: Dominant mass, RIGHT UPPER INNER QUADRANT. Initially, using sterile technique with chlorhexidine as skin antisepsis, 1% lidocaine and 1% lidocaine with epinephrine as local anesthetic, under direct ultrasound visualization, a 12 gauge Bard Marquee core needle device placed through an 11 gauge introducer needle was used to perform biopsy of the dominant mass in the [place] using a LATERAL approach. At the conclusion of the procedure a ribbon shaped tissue marker clip was deployed into the biopsy cavity. # 2) Lesion quadrant: UPPER OUTER QUADRANT. Next, using sterile technique with chlorhexidine as skin antisepsis, 1% lidocaine and 1% lidocaine with epinephrine as local anesthetic, under direct ultrasound visualization, a 12 gauge Bard Marquee core needle device placed through an 11 gauge introducer needle was used perform biopsy of the mass in the [place] using a LATERAL approach. At the conclusion of the procedure, a coil shaped tissue marker clip was deployed into the biopsy cavity. # 3) Level 1 RIGHT axillary node: Using sterile technique with chlorhexidine as skin antisepsis, 1% lidocaine and 1% lidocaine with epinephrine as local anesthetic, under direct ultrasound visualization, a 14 gauge Bard Marquee core needle device placed through a 13 gauge introducer needle was used perform biopsy of the largest pathologic RIGHT axillary lymph node using an inferolateral approach. At the conclusion of the procedure, a HydroMark spiral shaped tissue marker clip was deployed into the biopsy cavity. Follow up 2 view mammogram was performed and dictated separately. IMPRESSION: Ultrasound guided biopsy of 2 RIGHT breast masses and a pathologic RIGHT axillary lymph node. No apparent complications. Electronically Signed: By: Evangeline Dakin M.D. On: 04/20/2019 07:33        ELIGIBLE FOR AVAILABLE RESEARCH PROTOCOL: no   ASSESSMENT: 33 y.o. 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  Left breast was unremarkable  (1) neoadjuvant chemotherapy will consist of   (a) pembrolizumab started 05/03/2019, repeated every 21 days   (b) paclitaxel weekly x12 with carboplatin every 21 days x 4, started 05/08/2019  (c) cyclophosphamide and doxorubicin in dose dense fashion x4    (2) definitive surgery pending  (3) adjuvant radiation to follow  (4) genetics testing  (5) goserelin started 05/08/2019   PLAN:  Terri Estes is doing moderately well today.  I reviewed her lab work with her.  I reviewed her chemotherapy regimen, which will start with the Paclitaxel and Carboplatin first.  We reviewed how to take her anti nausea medications.  I reviewed that she should take Compazine with dinner tonight, then PRN.  I reviewed that she should put the Dexamethasone she picked up in her medication cabinet and she will take that with the Doxorubicin and Cyclophosphamide.  We reviewed potential side effects, particularly risk for peripheral neuropathy.  She has brought her bags to ice her fingertips and toes.   Terri Estes will complete her staging work up tomorrow with CT A/P and bone scan.    Terri Estes and I reviewed in detail my recommendation for her to stop breast feeding.  I let her know that she should not nurse any longer.    Terri Estes and I reviewed starting on Goserelin.  I reviewed that this will help  to shut down her ovaries.  This will help several things.  It will help with her breast engorgement pain from her milk production, as it will stop that production, it may help protect her fertility, it will help prevent pregnancy as she admits she forgets her contraceptives from time to time.  She is anxious about receiving it as an injection in her abdomen because she is fearful of needles.  I recommended she apply her emla cream to the area once the nurse decides where to give it.    Terri Estes also met with Dr. Jana Hakim.  He reviewed with her the treatment plan.  He reviewed the Goserelin.  He also examined her breast and axilla.  He recommended Gabapentin 353m PO TID for her breast pain, which I prescribed.  He reviewed risks/benefits of Gabapentin in detail.  I recommended that she start taking it at bed time and slowly increase the frequency as she adjusts to it.    [place] return in one week for labs, f/u, and her next treatment.    BTanzaniahas a good understanding of the overall plan. She agrees with it.  She will call with any problems that may develop before her next visit here.      LWilber Bihari NP  05/08/19 9:42 AM [facility] [address]Tel. [phone]   Fax. [phone]   ADDENDUM: BTanzaniatolerated her first cycle of pembrolizumab well.  She is ready to start the chemotherapy today.  She has a good understanding of the possible toxicities, side effects and complications of treatment.  She is going to receive the carboplatin and paclitaxel today.  She will return next week just for paclitaxel alone.  We will monitor closely not only her nadir counts but also for the risk of neuropathy.  We are starting goserelin today.  She understands that she will remain at risk of pregnancy for the next 6 weeks, but that after the second dose she will be in temporary menopause and would not need  contraception.  We will discontinue pregnancy testing after that  She is very concerned about psoriasis.  Chemotherapy actually will be helpful in that regard  She will have a restaging studies tomorrow.  She will see [place] next [place] and we will troubleshoot any side effects of chemo at that time  She knows to call for any other issues that may develop before the next scheduled visit.   I personally saw this patient and  performed a substantive portion of this encounter with the listed APP documented above.   Chauncey Cruel, MD Medical Oncology and Hematology [facility] [address] [place], [place] [address] Tel. [phone]    Fax. [phone]

## 2019-05-08 NOTE — Patient Instructions (Signed)

## 2019-05-09 ENCOUNTER — Ambulatory Visit (HOSPITAL_COMMUNITY)
Admission: RE | Admit: 2019-05-09 | Discharge: 2019-05-09 | Disposition: A | Discharge status: Home / Self Care | Payer: Medicaid Other | Source: Ambulatory Visit | Attending: Adult Health | Admitting: Adult Health

## 2019-05-09 ENCOUNTER — Encounter (HOSPITAL_COMMUNITY)
Admission: RE | Admit: 2019-05-09 | Discharge: 2019-05-09 | Disposition: A | Discharge status: Home / Self Care | Payer: Medicaid Other | Source: Ambulatory Visit | Attending: Adult Health | Admitting: Adult Health

## 2019-05-09 ENCOUNTER — Telehealth: Payer: Self-pay | Admitting: *Deleted

## 2019-05-09 ENCOUNTER — Encounter (HOSPITAL_COMMUNITY): Payer: Self-pay

## 2019-05-09 ENCOUNTER — Encounter: Payer: Self-pay | Admitting: General Practice

## 2019-05-09 DIAGNOSIS — C50811 Malignant neoplasm of overlapping sites of right female breast: Secondary | ICD-10-CM | POA: Insufficient documentation

## 2019-05-09 DIAGNOSIS — Z171 Estrogen receptor negative status [ER-]: Secondary | ICD-10-CM

## 2019-05-09 MED ORDER — IOHEXOL 300 MG/ML  SOLN
100.0000 mL | Freq: Once | INTRAMUSCULAR | Status: AC | PRN
  Administered 2019-05-09: 100 mL via INTRAVENOUS

## 2019-05-09 MED ORDER — SODIUM CHLORIDE (PF) 0.9 % IJ SOLN
INTRAMUSCULAR | Status: AC
  Filled 2019-05-09: qty 50

## 2019-05-09 MED ORDER — TECHNETIUM TC 99M MEDRONATE IV KIT
21.6000 | PACK | Freq: Once | INTRAVENOUS | Status: AC | PRN
  Administered 2019-05-09: 21.6 via INTRAVENOUS

## 2019-05-09 NOTE — Telephone Encounter (Signed)
Left message for a return phone call to follow up after 1st chemo.

## 2019-05-09 NOTE — Progress Notes (Signed)
[place] CSW Progress Notes  Referred patient to [employer] via [place] [employer] for help w addressing environmental concerns in rental home.  Per patient, landlord has not addressed issues w water leakage and mold, causing problems for children w respiratory issues, oxygen dependent and similar.  Called patient to alert her to call from [employer] - pt states she did receive call, did not pick up but will call soon to discuss options for help w locating more appropriate housing.  [name], LCSW Clinical Social Worker Phone:  [phone]

## 2019-05-10 ENCOUNTER — Encounter: Payer: Self-pay | Admitting: General Practice

## 2019-05-10 ENCOUNTER — Telehealth: Payer: Self-pay | Admitting: *Deleted

## 2019-05-10 NOTE — Telephone Encounter (Signed)
This RN retrieved VM left by pt stating " I tried call the urgent number to tell them I am bleeding in my bowel movement " " I need to know what to do ?"  Return call number given as [phone].  This RN returned call and post numerous rings- VM received- message left to return call to discuss above.  This RN also attempted to reach pt per 2nd number on demographic page and again obtained VM- message left to return call.

## 2019-05-10 NOTE — Progress Notes (Signed)
[place] CSW Progress Notes  Spoke w [employer] of [employer].  She has spoken w patient and patient's daughter' school Education officer, museum by phone.  Is working on finding available housing resources for patient.  This agency will continue to try to address need for safe and healthy home environment for patient and family, is aware of environmental concerns in present situation.  CSW will stand by to assist as needed w housing concerns.  [name], LCSW Clinical Social Worker Phone:  [phone]

## 2019-05-11 ENCOUNTER — Ambulatory Visit: Payer: Medicaid Other

## 2019-05-11 ENCOUNTER — Other Ambulatory Visit: Payer: Self-pay | Admitting: Emergency Medicine

## 2019-05-11 ENCOUNTER — Ambulatory Visit: Payer: Medicaid Other | Admitting: Oncology

## 2019-05-11 ENCOUNTER — Inpatient Hospital Stay: Payer: Medicaid Other

## 2019-05-11 ENCOUNTER — Telehealth: Payer: Self-pay | Admitting: *Deleted

## 2019-05-11 ENCOUNTER — Other Ambulatory Visit: Payer: Self-pay

## 2019-05-11 ENCOUNTER — Inpatient Hospital Stay (HOSPITAL_BASED_OUTPATIENT_CLINIC_OR_DEPARTMENT_OTHER): Payer: Medicaid Other | Admitting: Medical

## 2019-05-11 VITALS — BP 103/76 | HR 85 | Temp 97.9°F | Resp 20 | Ht 60.0 in

## 2019-05-11 DIAGNOSIS — R590 Localized enlarged lymph nodes: Secondary | ICD-10-CM

## 2019-05-11 DIAGNOSIS — Z800 Family history of malignant neoplasm of digestive organs: Secondary | ICD-10-CM

## 2019-05-11 DIAGNOSIS — R609 Edema, unspecified: Secondary | ICD-10-CM

## 2019-05-11 DIAGNOSIS — Z79899 Other long term (current) drug therapy: Secondary | ICD-10-CM

## 2019-05-11 DIAGNOSIS — Z802 Family history of malignant neoplasm of other respiratory and intrathoracic organs: Secondary | ICD-10-CM

## 2019-05-11 DIAGNOSIS — M542 Cervicalgia: Secondary | ICD-10-CM

## 2019-05-11 DIAGNOSIS — C50411 Malignant neoplasm of upper-outer quadrant of right female breast: Secondary | ICD-10-CM

## 2019-05-11 DIAGNOSIS — R918 Other nonspecific abnormal finding of lung field: Secondary | ICD-10-CM

## 2019-05-11 DIAGNOSIS — E860 Dehydration: Secondary | ICD-10-CM

## 2019-05-11 DIAGNOSIS — R197 Diarrhea, unspecified: Secondary | ICD-10-CM

## 2019-05-11 DIAGNOSIS — F419 Anxiety disorder, unspecified: Secondary | ICD-10-CM

## 2019-05-11 DIAGNOSIS — R234 Changes in skin texture: Secondary | ICD-10-CM

## 2019-05-11 DIAGNOSIS — C50211 Malignant neoplasm of upper-inner quadrant of right female breast: Secondary | ICD-10-CM

## 2019-05-11 DIAGNOSIS — K219 Gastro-esophageal reflux disease without esophagitis: Secondary | ICD-10-CM

## 2019-05-11 DIAGNOSIS — Z72820 Sleep deprivation: Secondary | ICD-10-CM

## 2019-05-11 DIAGNOSIS — Z5111 Encounter for antineoplastic chemotherapy: Secondary | ICD-10-CM | POA: Diagnosis not present

## 2019-05-11 DIAGNOSIS — L409 Psoriasis, unspecified: Secondary | ICD-10-CM

## 2019-05-11 DIAGNOSIS — Z820 Family history of epilepsy and other diseases of the nervous system: Secondary | ICD-10-CM

## 2019-05-11 DIAGNOSIS — Z7951 Long term (current) use of inhaled steroids: Secondary | ICD-10-CM

## 2019-05-11 DIAGNOSIS — Z841 Family history of disorders of kidney and ureter: Secondary | ICD-10-CM

## 2019-05-11 DIAGNOSIS — Z87891 Personal history of nicotine dependence: Secondary | ICD-10-CM

## 2019-05-11 DIAGNOSIS — C50811 Malignant neoplasm of overlapping sites of right female breast: Secondary | ICD-10-CM

## 2019-05-11 DIAGNOSIS — Z171 Estrogen receptor negative status [ER-]: Secondary | ICD-10-CM

## 2019-05-11 DIAGNOSIS — I2510 Atherosclerotic heart disease of native coronary artery without angina pectoris: Secondary | ICD-10-CM

## 2019-05-11 DIAGNOSIS — Z833 Family history of diabetes mellitus: Secondary | ICD-10-CM

## 2019-05-11 DIAGNOSIS — F1220 Cannabis dependence, uncomplicated: Secondary | ICD-10-CM

## 2019-05-11 DIAGNOSIS — Z803 Family history of malignant neoplasm of breast: Secondary | ICD-10-CM

## 2019-05-11 DIAGNOSIS — G8929 Other chronic pain: Secondary | ICD-10-CM

## 2019-05-11 LAB — CBC WITH DIFFERENTIAL (CANCER CENTER ONLY)
Abs Immature Granulocytes: 0.05 10*3/uL (ref 0.00–0.07)
Basophils Absolute: 0 10*3/uL (ref 0.0–0.1)
Basophils Relative: 0 %
Eosinophils Absolute: 0 10*3/uL (ref 0.0–0.5)
Eosinophils Relative: 0 %
HCT: 34.8 % — ABNORMAL LOW (ref 36.0–46.0)
Hemoglobin: 11.2 g/dL — ABNORMAL LOW (ref 12.0–15.0)
Immature Granulocytes: 0 %
Lymphocytes Relative: 13 %
Lymphs Abs: 1.4 10*3/uL (ref 0.7–4.0)
MCH: 29.6 pg (ref 26.0–34.0)
MCHC: 32.2 g/dL (ref 30.0–36.0)
MCV: 91.8 fL (ref 80.0–100.0)
Monocytes Absolute: 0.4 10*3/uL (ref 0.1–1.0)
Monocytes Relative: 4 %
Neutro Abs: 9.4 10*3/uL — ABNORMAL HIGH (ref 1.7–7.7)
Neutrophils Relative %: 83 %
Platelet Count: 338 10*3/uL (ref 150–400)
RBC: 3.79 MIL/uL — ABNORMAL LOW (ref 3.87–5.11)
RDW: 14.5 % (ref 11.5–15.5)
WBC Count: 11.2 10*3/uL — ABNORMAL HIGH (ref 4.0–10.5)
nRBC: 0 % (ref 0.0–0.2)

## 2019-05-11 LAB — CMP (CANCER CENTER ONLY)
ALT: 44 U/L (ref 0–44)
AST: 20 U/L (ref 15–41)
Albumin: 3.8 g/dL (ref 3.5–5.0)
Alkaline Phosphatase: 92 U/L (ref 38–126)
Anion gap: 8 (ref 5–15)
BUN: 11 mg/dL (ref 6–20)
CO2: 26 mmol/L (ref 22–32)
Calcium: 9.4 mg/dL (ref 8.9–10.3)
Chloride: 99 mmol/L (ref 98–111)
Creatinine: 0.78 mg/dL (ref 0.44–1.00)
GFR, Est AFR Am: >60 mL/min (ref >60)
GFR, Estimated: >60 mL/min (ref >60)
Glucose, Bld: 94 mg/dL (ref 70–99)
Potassium: 4 mmol/L (ref 3.5–5.1)
Sodium: 133 mmol/L — ABNORMAL LOW (ref 135–145)
Total Bilirubin: 0.4 mg/dL (ref 0.3–1.2)
Total Protein: 7.8 g/dL (ref 6.5–8.1)

## 2019-05-11 LAB — MAGNESIUM: Magnesium: 1.9 mg/dL (ref 1.7–2.4)

## 2019-05-11 MED ORDER — SODIUM CHLORIDE 0.9% FLUSH
10.0000 mL | INTRAVENOUS | Status: DC | PRN
  Filled 2019-05-11: qty 10

## 2019-05-11 MED ORDER — SODIUM CHLORIDE 0.9 % IV SOLN
Freq: Once | INTRAVENOUS | Status: AC
  Administered 2019-05-11: 14:00:00 via INTRAVENOUS
  Filled 2019-05-11: qty 250

## 2019-05-11 MED ORDER — OMEPRAZOLE 40 MG PO CPDR: 40.0000 mg | DELAYED_RELEASE_CAPSULE | Freq: Every day | ORAL | 5 refills | Status: DC

## 2019-05-11 MED ORDER — SODIUM CHLORIDE 0.9% FLUSH
10.0000 mL | INTRAVENOUS | Status: DC | PRN
  Administered 2019-05-11: 10 mL
  Filled 2019-05-11: qty 10

## 2019-05-11 MED ORDER — DIPHENOXYLATE-ATROPINE 2.5-0.025 MG PO TABS
ORAL_TABLET | ORAL | Status: AC
  Filled 2019-05-11: qty 2

## 2019-05-11 MED ORDER — DIPHENOXYLATE-ATROPINE 2.5-0.025 MG PO TABS
2.0000 | ORAL_TABLET | Freq: Once | ORAL | Status: AC
  Administered 2019-05-11: 2 via ORAL

## 2019-05-11 MED ORDER — CHOLESTYRAMINE 4 G PO PACK: 4.0000 g | PACK | Freq: Three times a day (TID) | ORAL | 12 refills | Status: AC

## 2019-05-11 MED ORDER — ONDANSETRON HCL 8 MG PO TABS: 8.0000 mg | ORAL_TABLET | Freq: Three times a day (TID) | ORAL | 0 refills | Status: DC | PRN

## 2019-05-11 MED ORDER — HEPARIN SOD (PORK) LOCK FLUSH 100 UNIT/ML IV SOLN
500.0000 [IU] | Freq: Once | INTRAVENOUS | Status: AC | PRN
  Administered 2019-05-11: 500 [IU]
  Filled 2019-05-11: qty 5

## 2019-05-11 MED ORDER — PAROXETINE HCL 10 MG PO TABS: 10.0000 mg | ORAL_TABLET | Freq: Every day | ORAL | 5 refills | Status: DC

## 2019-05-11 NOTE — Progress Notes (Signed)
Pt received 1L IVF NS, reports feeling better at end of tx.  One small bout of diarrhea during infusion, given oral lomotil per PA Van.

## 2019-05-11 NOTE — Patient Instructions (Signed)

## 2019-05-11 NOTE — Telephone Encounter (Signed)
This RN called pt per scheduled visit for diarrhea - per on call 5/14 and no show at this time.  Terri Estes states she " is sitting on the toilet right now - this diarrhea won't stop "  She states she took 2 imodium pm 5/14 with benefit " but haven't taken anymore and don't know if I need to ?"  She states she is drinking " a quart of gatorade at least every 2 hours because that is what [name] told me to do last night "  She states she has not been able to leave her home due to " having to go to the bathroom every 20 minutes at least and if I feel like I have to go - I do and it is hard to be far from the toilet "  This RN informed pt to take 2 imodium now with water- she needs to not eat or drink at this time to allow bowels to rest.  Due to concerns for symptoms we want to see her today so we can help her especially since this is Friday.  This RN informed pt to bring all her medications including vitamins with her so we can see if any medications could be contributing to her diarrhea.  Terri Estes verbalized understanding and plan and will do the above.  Appointments will be rescheduled.

## 2019-05-11 NOTE — Progress Notes (Signed)
[facility] Progress Note   Terri Estes [id number] [birth year] 33 y.o.  Terri Estes is managed by Dr. Jana Hakim  Actively treated with chemotherapy/immunotherapy/hormonal therapy: yes  Current therapy: Carboplatin and paclitaxel  Last treated: 05/08/2019 (cycle 1, day 1)  Next scheduled appointment with provider: 05/15/2019  Assessment: Plan:    Dehydration - Plan: 0.9 %  sodium chloride infusion  Malignant neoplasm of overlapping sites of right breast in female, estrogen receptor negative ([place]) - Plan: heparin lock flush 100 unit/mL, sodium chloride flush (NS) 0.9 % injection 10 mL  Diarrhea, unspecified type - Plan: cholestyramine (QUESTRAN) 4 g packet, diphenoxylate-atropine (LOMOTIL) 2.5-0.025 MG per tablet 2 tablet  Anxiety - Plan: PARoxetine (PAXIL) 10 MG tablet  Gastroesophageal reflux disease, esophagitis presence not specified - Plan: omeprazole (PRILOSEC) 40 MG capsule   Dehydration: The patient was given 1 L of normal saline IV today.  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  She is scheduled to return on 05/15/2019 for follow-up.  Patient CBC was reviewed and returned within stable.  Diarrhea: The patient was given Lomotil 2 tablets p.o. x1 today.  She was instructed to continue using Imodium as needed.  She has a prescription for Questran at her pharmacy which she will pick up later today.  Anxiety: The patient was given a prescription for Paxil 10 mg once daily.  She had previously been treated with Zoloft without benefit.  GERD: Patient was given a prescription for Prilosec 40 mg once daily.  Please see After Visit Summary for patient specific instructions.  Future Appointments  Date Time Provider [place]  05/15/2019 12:30 PM [facility] LAB 6 [facility] None  05/15/2019  1:00 PM Causey, Charlestine Massed,  NP [facility] None  05/15/2019  2:00 PM [facility] INFUSION [facility] None    No orders of the defined types were placed in this encounter.      Subjective:   Patient ID:  Terri Estes is a 33 y.o. (DOB [birth year]) female.  Chief Complaint:  Chief Complaint  Patient presents with   Diarrhea    HPI Terri Estes   is a 33 year old female with a history of a triple negative malignant neoplasm of the right breast who is managed by Dr. Jana Hakim and is status post cycle 1, day 1 of carboplatin and paclitaxel which was dosed on 05/08/2019.  She presents to the clinic today having developed diarrhea last evening.  She reports having noted blood in her bowel movement.  She has been using Imodium today with no further diarrhea.  She does report having abdominal gas.  She denies nausea, vomiting, fevers, chills, shortness of breath, chest pain, or abdominal pain.  She reports that she has been having anxiety which is interfering with her sleep.  She had previously been treated with Zoloft for this without benefit.  She also reports that she has been having GERD.  She has been taking Tums without benefit.  Medications: I have reviewed the patient's current medications.  Allergies: No Known Allergies  Past Medical History:  Diagnosis Date   Anemia    after pregnancy   Anxiety    severe not taking meds   Asthma    inhaler  4x per day   Chronic pain    Family history of breast cancer    History of substance abuse ([place])    Pregnancy induced hypertension    takes medication  now.   Psoriasis    Trichomonas infection    Vaginal Pap smear, abnormal     Past Surgical History:  Procedure Laterality Date   CESAREAN SECTION  05/26/2008   CESAREAN SECTION N/A 07/10/2017   Procedure: CESAREAN SECTION;  Surgeon: Florian Buff, MD;  Location: [facility];  Service: Obstetrics;  Laterality: N/A;   CESAREAN SECTION N/A 09/23/2018   Procedure: CESAREAN SECTION;   Surgeon: Donnamae Jude, MD;  Location: [facility];  Service: Obstetrics;  Laterality: N/A;   COLPOSCOPY     PORTACATH PLACEMENT Left 04/30/2019   Procedure: INSERTION PORT-A-CATH WITH ULTRASOUND;  Surgeon: Alphonsa Overall, MD;  Location: [facility];  Service: General;  Laterality: Left;  Left Subclavian vein   WISDOM TOOTH EXTRACTION      Family History  Problem Relation Age of Onset   Kidney disease Maternal Grandmother    Breast cancer Maternal Grandmother 28       metastatic to liver; d. 32   Kidney disease Maternal Grandfather    Kidney disease Paternal Grandmother    Diabetes Paternal Grandmother    Breast cancer Paternal Grandmother    Kidney disease Paternal Grandfather    Breast cancer Paternal Aunt    Cerebral palsy Child    Breast cancer Paternal Great-grandmother    Pancreatic cancer Other        PGMs brother    Throat cancer Paternal Uncle    Breast cancer Other        PGMs sister    Social History   Socioeconomic History   Marital status: Significant Other    Spouse name: [name]    Number of children: 3   Years of education: Not on file   Highest education level: Not on file  Occupational History   Not on file  Social Needs   Financial resource strain: Not on file   Food insecurity:    Worry: Not on file    Inability: Not on file   Transportation needs:    Medical: Not on file    Non-medical: Not on file  Tobacco Use   Smoking status: Former Smoker    Packs/day: 0.50    Years: 5.00    Pack years: 2.50    Types: Cigarettes    Last attempt to quit: 04/10/2019    Years since quitting: 0.0   Smokeless tobacco: Never Used   Tobacco comment: trying to quit  Substance and Sexual Activity   Alcohol use: No   Drug use: Yes    Frequency: 7.0 times per week    Types: Marijuana    Comment: subutex   Sexual activity: Yes    Birth control/protection: None    Comment: none  Lifestyle   Physical activity:    Days per week:  Not on file    Minutes per session: Not on file   Stress: Not on file  Relationships   Social connections:    Talks on phone: Not on file    Gets together: Not on file    Attends religious service: Not on file    Active member of club or organization: Not on file    Attends meetings of clubs or organizations: Not on file    Relationship status: Not on file   Intimate partner violence:    Fear of current or ex partner: Not on file    Emotionally abused: Not on file    Physically abused: Not on file    Forced sexual activity:  Not on file  Other Topics Concern   Not on file  Social History Narrative   Not on file    Past Medical History, Surgical history, Social history, and Family history were reviewed and updated as appropriate.   Please see review of systems for further details on the patient's review from today.   Review of Systems:  Review of Systems  Constitutional: Negative for appetite change, chills, diaphoresis and fever.  HENT: Negative for trouble swallowing and voice change.   Respiratory: Negative for cough, chest tightness, shortness of breath and wheezing.   Cardiovascular: Negative for chest pain, palpitations and leg swelling.  Gastrointestinal: Positive for blood in stool and diarrhea. Negative for abdominal distention, abdominal pain, constipation, nausea and vomiting.       GERD  Genitourinary: Negative for decreased urine volume and difficulty urinating.  Musculoskeletal: Negative for back pain and myalgias.  Neurological: Negative for dizziness, weakness, light-headedness and headaches.  Psychiatric/Behavioral: The patient is nervous/anxious.     Objective:   Physical Exam:  BP 103/76 (BP Location: Right Arm, Patient Position: Sitting)    Pulse 85    Temp 97.9 F (36.6 C) (Oral)    Resp 20    Ht 5' (1.524 m)    LMP 04/20/2019    SpO2 100%    BMI 38.63 kg/m  ECOG: 1  Physical Exam Constitutional:      General: She is not in acute distress.     Appearance: She is not diaphoretic.  HENT:     Head: Normocephalic and atraumatic.  Eyes:     General: No scleral icterus.       Right eye: No discharge.        Left eye: No discharge.  Cardiovascular:     Rate and Rhythm: Normal rate and regular rhythm.     Heart sounds: Normal heart sounds. No murmur. No friction rub. No gallop.   Pulmonary:     Effort: Pulmonary effort is normal. No respiratory distress.     Breath sounds: Normal breath sounds. No wheezing or rales.  Abdominal:     General: Bowel sounds are increased. There is no distension.     Palpations: Abdomen is soft.     Tenderness: There is no abdominal tenderness. There is no guarding.  Skin:    General: Skin is warm and dry.     Findings: No erythema or rash.  Neurological:     Mental Status: She is alert.     Coordination: Coordination normal.     Gait: Gait normal.     Lab Review:     Component Value Date/Time   NA 133 (L) 05/11/2019 1305   NA 137 05/10/2018 1535   K 4.0 05/11/2019 1305   CL 99 05/11/2019 1305   CO2 26 05/11/2019 1305   GLUCOSE 94 05/11/2019 1305   BUN 11 05/11/2019 1305   BUN 7 05/10/2018 1535   CREATININE 0.78 05/11/2019 1305   CALCIUM 9.4 05/11/2019 1305   PROT 7.8 05/11/2019 1305   PROT 7.2 05/10/2018 1535   ALBUMIN 3.8 05/11/2019 1305   ALBUMIN 4.2 05/10/2018 1535   AST 20 05/11/2019 1305   ALT 44 05/11/2019 1305   ALKPHOS 92 05/11/2019 1305   BILITOT 0.4 05/11/2019 1305   GFRNONAA >60 05/11/2019 1305   GFRAA >60 05/11/2019 1305       Component Value Date/Time   WBC 11.2 (H) 05/11/2019 1305   WBC 9.9 05/08/2019 0912   RBC 3.79 (L) 05/11/2019 1305  HGB 11.2 (L) 05/11/2019 1305   HGB 9.9 (L) 09/13/2018 1133   HCT 34.8 (L) 05/11/2019 1305   HCT 29.2 (L) 09/13/2018 1133   PLT 338 05/11/2019 1305   PLT 186 09/13/2018 1133   MCV 91.8 05/11/2019 1305   MCV 93 09/13/2018 1133   MCH 29.6 05/11/2019 1305   MCHC 32.2 05/11/2019 1305   RDW 14.5 05/11/2019 1305   RDW 13.4  09/13/2018 1133   LYMPHSABS 1.4 05/11/2019 1305   LYMPHSABS 1.4 05/10/2018 1535   MONOABS 0.4 05/11/2019 1305   EOSABS 0.0 05/11/2019 1305   EOSABS 0.0 05/10/2018 1535   BASOSABS 0.0 05/11/2019 1305   BASOSABS 0.0 05/10/2018 1535   -------------------------------  Imaging from last 24 hours (if applicable):  Radiology interpretation: Nm Bone Scan Whole Body  Result Date: 05/10/2019 CLINICAL DATA:  Breast cancer EXAM: NUCLEAR MEDICINE WHOLE BODY BONE SCAN TECHNIQUE: Whole body anterior and posterior images were obtained approximately 3 hours after intravenous injection of radiopharmaceutical. RADIOPHARMACEUTICALS:  21.6 mCi Technetium-58mMDP IV COMPARISON:  Abdomen and pelvis CT 05/09/2019.  Chest CT 04/11/2019. FINDINGS: No focal radiotracer accumulation within the bony anatomy to suggest osseous metastatic disease. IMPRESSION: Negative for bony metastatic involvement. Electronically Signed   By: EMisty StanleyM.D.   On: 05/10/2019 09:12   Ct Abdomen Pelvis W Contrast  Result Date: 05/10/2019 CLINICAL DATA:  Right-sided breast cancer EXAM: CT ABDOMEN AND PELVIS WITH CONTRAST TECHNIQUE: Multidetector CT imaging of the abdomen and pelvis was performed using the standard protocol following bolus administration of intravenous contrast. CONTRAST:  1050mOMNIPAQUE IOHEXOL 300 MG/ML  SOLN COMPARISON:  None. FINDINGS: Lower chest: Skin thickening and infiltrative changes noted right breast. Small pulmonary nodules are identified in the lung bases bilaterally, as noted on recent dedicated chest CT. Some of the pulmonary nodules are new including posterior right lower lobe 4 mm nodule on 11/6 and 4 mm posterior right lower lobe nodule on 07/6. Hepatobiliary: 5 mm low-density lesion lateral segment left liver (18/2) is too small to characterize. There is no evidence for gallstones, gallbladder wall thickening, or pericholecystic fluid. No intrahepatic or extrahepatic biliary dilation. Pancreas: No focal  mass lesion. No dilatation of the main duct. No intraparenchymal cyst. No peripancreatic edema. Spleen: No splenomegaly. No focal mass lesion. Adrenals/Urinary Tract: No adrenal nodule or mass. Kidneys unremarkable. No evidence for hydroureter. The urinary bladder appears normal for the degree of distention. Stomach/Bowel: Stomach is unremarkable. No gastric wall thickening. No evidence of outlet obstruction. Duodenum is normally positioned as is the ligament of Treitz. No small bowel wall thickening. No small bowel dilatation. The terminal ileum is normal.The appendix is normal. No gross colonic mass. No colonic wall thickening. Vascular/Lymphatic: No abdominal aortic aneurysm. No abdominal aortic atherosclerotic calcification. There is no gastrohepatic or hepatoduodenal ligament lymphadenopathy. No intraperitoneal or retroperitoneal lymphadenopathy. No pelvic sidewall lymphadenopathy. Reproductive: The uterus is unremarkable.  There is no adnexal mass. Other: No intraperitoneal free fluid. Musculoskeletal: Sclerosis noted at the SI joints bilaterally, noted on x-ray from 09/23/2018. IMPRESSION: 1. Pulmonary nodules in the lung bases concerning for metastatic disease. 2. No evidence for soft tissue metastatic disease in the abdomen or pelvis. Attention to a tiny low-density lesion in the lateral segment left liver is recommended on follow-up. 3. Sclerosis at the right SI joint, likely degenerative or inflammatory. No appreciable uptake on bone scan today to suggest metastatic involvement. Electronically Signed   By: ErMisty Stanley.D.   On: 05/10/2019 09:56   Mr Breast Bilateral  [place] Cad  Result Date: 04/30/2019 CLINICAL DATA:  33 year old breast-feeding patient recently diagnosed with grade 3 invasive ductal carcinoma of the right breast following ultrasound-guided biopsy of a dominant necrotic mass in the upper inner quadrant at 12:30 position. She was also diagnosed with grade 3 invasive ductal  carcinoma of the right breast, satellite mass, upper outer quadrant 10 o'clock 8 cm from the nipple. Pathology showed invasive ductal carcinoma of a level 1 right axillary lymph node. LABS:  None obtained EXAM: BILATERAL BREAST MRI WITH AND WITHOUT CONTRAST TECHNIQUE: Multiplanar, multisequence MR images of both breasts were obtained prior to and following the intravenous administration of 9 ml of Gadavist Three-dimensional MR images were rendered by post-processing of the original MR data on an independent workstation. The three-dimensional MR images were interpreted, and findings are reported in the following complete MRI report for this study. Three dimensional images were evaluated at the independent DynaCad workstation COMPARISON:  Previous exam(s) April 19, 2019. FINDINGS: Breast composition: b. Scattered fibroglandular tissue. Background parenchymal enhancement: Moderate. Right breast: The right breast is larger and more rounded in contour than the normal-appearing left side, and has prominent vascularity. There is diffuse thickening of the skin of the right breast with patchy areas of suspicious enhancement within the thickened skin. Skin thickness measures at least up to 1.1 cm. There is diffusely increased T2 weighted signal throughout the right breast, the skin of the right breast, and the right pectoralis muscle. A dominant centrally necrotic irregular peripherally enhancing mass that corresponds to the biopsy-proven malignancy in the 12:30 position of the right breast measures 5.2 x 4.4 x 3.8 cm. This contains biopsy clip artifact. A second biopsy clip artifact is noted in the upper-outer right breast, posterior third, corresponding to the position of the coil shaped biopsy clip on the mammogram where a second smaller malignant mass was recently biopsied. There are multiple approximately 1 cm and smaller ring-enhancing masses along the periphery of the dominant mass, and scattered throughout all 4  quadrants of the right breast. Additionally, 2 rim-enhancing masses in the anterior right breast, medial to the nipple, appear to be within the skin, image 68/160 series number 6. In addition to the suspicious ring-enhancing masses seen in all 4 quadrants of the right breast, there are extensive areas of nonmass enhancement involving all 4 quadrants of the right breast. The right pectoralis muscle is thickened, contains abnormal T2 signal and demonstrates focal areas of suspicious enhancement, especially in its medial aspect. Left breast: No mass or abnormal enhancement. Lymph nodes: There some peripherally enhancing abnormal right axillary lymph nodes consistent with biopsy-proven metastatic lymphadenopathy. In the superior most images is a node or conglomerate of nodes with peripheral enhancement that measures 4 x 2 cm in the axial plane. There are additional smaller suspicious lymph nodes in the right axilla and right subpectoral lymphadenopathy is seen on image number 7 of series 3. There are small rim-enhancing nodules along the internal mammary chain on the right, suspicious for internal mammary chain lymphadenopathy. On image number 109/160 of series 6 there is a 1.0 cm probable lymph node adjacent to the right heart border, with smaller similar appearing nodule inferior to it that may reflect juxtaphrenic/juxtacardiac pathologic lymphadenopathy. Ancillary findings:  None. IMPRESSION: 1. Multifocal biopsy-proven malignancy in the right breast. MRI findings suggest diffuse malignant involvement throughout all 4 quadrants of the right breast, with associated malignant involvement of the skin suspected, and findings concerning for malignant involvement of the right pectoralis muscle.  2. Right axillary, subpectoral, and internal mammary chain lymphadenopathy. Cannot exclude suspicious lymph nodes in the juxta cardiac/juxta phrenic location. The patient has a scheduled PET-CT. 3. No MRI evidence of malignancy in  the left breast. RECOMMENDATION: Continued treatment planning for known right breast carcinoma. BI-RADS CATEGORY  6: Known biopsy-proven malignancy. Electronically Signed   By: Curlene Dolphin M.D.   On: 04/30/2019 16:49   Dg Chest Port 1 View  Result Date: 04/30/2019 CLINICAL DATA:  Porta catheter placement EXAM: PORTABLE CHEST 1 VIEW COMPARISON:  Chest CT April 11, 2019 FINDINGS: The mediastinal contour is normal. The heart size is probably mildly enlarged. The lung volumes are low. A left central venous line is identified with distal tip in the right atrium. There is no pneumothorax. There is enlargement of central pulmonary vessel caliber which may be due to low lung volumes. There is no focal pneumonia or pleural effusion. The visualized skeletal structures are unremarkable. IMPRESSION: Left central venous line distal tip in the right atrium. No pneumothorax. Low lung volumes. Enlargement of central pulmonary vessel caliber bilaterally which may be due to low lung volumes. Electronically Signed   By: Abelardo Diesel M.D.   On: 04/30/2019 14:14   Dg C-arm 1-60 Min-no Report  Result Date: 04/30/2019 Fluoroscopy was utilized by the requesting physician.  No radiographic interpretation.   US Breast Ltd Uni Right Inc Axilla  Result Date: 04/19/2019 CLINICAL DATA:  33 year old presenting with a large mass involving the RIGHT breast on recent CT chest 04/11/2019. The CT also demonstrated extensive RIGHT axillary lymphadenopathy and marked thickening of the skin of the RIGHT breast.This is the patient's initial baseline mammogram. Family history of breast cancer in her maternal grandmother, paternal grandmother and a paternal aunt. EXAM: DIGITAL DIAGNOSTIC BILATERAL MAMMOGRAM WITH CAD AND TOMO ULTRASOUND RIGHT BREAST COMPARISON:  None. ACR Breast Density Category c: The breast tissue is heterogeneously dense, which may obscure small masses. FINDINGS: Tomosynthesis and synthesized full field CC and MLO views of  both breasts were obtained. A large mass measuring on the order of 4.6 x 5.5 x 5.2 cm is confirmed in the UPPER INNER QUADRANT of the LEFT breast with associated architectural distortion. A satellite mass on the order of 1.2 cm is present ANTERIOR to the dominant mass. A mass on the order of 1.0 cm is present in the OUTER breast at POSTERIOR depth, visible on the CC images but not clearly visualized on the MLO images. Architectural distortion is present in the UPPER OUTER quadrant which is separate from this mass. There is marked skin thickening throughout the RIGHT breast in there is marked edema/trabecular thickening throughout the breast. A pathologic low RIGHT axillary lymph node is present which measures on the order of 3 cm. No findings suspicious for malignancy in the LEFT breast. Mammographic images were processed with CAD. On correlative physical exam, the entire RIGHT breast is edematous and extremely firm to the touch. There is evidence of peau d'orange involving the skin of the breast. Targeted RIGHT breast ultrasound is performed, showing the dominant hypoechoic mass at the 12:30 o'clock position approximately 5 cm from the nipple measuring at least 3.0 x 5.1 x 6.6 cm, with central necrosis. 2 satellite masses are present adjacent to the dominant mass at the 12:30 o'clock position approximately 4 cm from the nipple, the larger measuring approximately 1.1 x 1.3 x 1.5 cm and the smaller measuring approximately 0.6 x 1.1 x 1.3 cm; one of these masses accounts for the mammographic mass identified  ANTERIOR to the dominant mass. A satellite mass is present at the 12:30 o'clock subareolar location measuring approximately 1.6 x 1.2 x 1.7 cm. A solid mass is present at the 10 o'clock position approximately 8 cm from the nipple measuring approximately 0.7 x 0.7 x 0.8 cm, corresponding to the mass identified in the OUTER breast on mammography. There is extensive edema throughout the RIGHT breast and there is  marked skin thickening throughout the RIGHT breast, measuring up to 1.3 cm in the periareolar location. Sonographic evaluation of the RIGHT axilla demonstrates 3 pathologic lymph nodes, the largest measuring approximately 2.5 x 2.0 x 2.5 cm and corresponding to the mammographic node. An adjacent node measures approximately 0.8 cm. A third node which is subpectoral in location measures approximately 1.1 cm and has cortical thickening up to 5 mm. Of note, it appeared that there were more than 3 pathologic nodes on the recent chest CT. IMPRESSION: 1. 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 2. No mammographic evidence of malignancy involving the LEFT breast. RECOMMENDATION: Ultrasound-guided core needle biopsy of the dominant RIGHT breast mass and the mass in the [place] in an attempt to confirm multicentric disease and ultrasound-guided core needle biopsy of the largest pathologic RIGHT axillary lymph node. I have discussed the findings and recommendations with the patient. The biopsies were performed subsequently same day and are reported separately. BI-RADS CATEGORY  5: Highly suggestive of malignancy. Electronically Signed   By: Evangeline Dakin M.D.   On: 04/19/2019 17:47   Mm Diag Breast Tomo Bilateral  Result Date: 04/19/2019 CLINICAL DATA:  32 year old presenting with a large mass involving the RIGHT breast on recent CT chest 04/11/2019. The CT also demonstrated extensive RIGHT axillary lymphadenopathy and marked thickening of the skin of the RIGHT breast.This is the patient's initial baseline mammogram. Family history of breast cancer in her maternal grandmother, paternal grandmother and a paternal aunt. EXAM: DIGITAL DIAGNOSTIC BILATERAL MAMMOGRAM WITH CAD AND TOMO ULTRASOUND RIGHT BREAST COMPARISON:  None. ACR Breast Density Category c: The breast tissue is heterogeneously dense, which may obscure  small masses. FINDINGS: Tomosynthesis and synthesized full field CC and MLO views of both breasts were obtained. A large mass measuring on the order of 4.6 x 5.5 x 5.2 cm is confirmed in the UPPER INNER QUADRANT of the LEFT breast with associated architectural distortion. A satellite mass on the order of 1.2 cm is present ANTERIOR to the dominant mass. A mass on the order of 1.0 cm is present in the OUTER breast at POSTERIOR depth, visible on the CC images but not clearly visualized on the MLO images. Architectural distortion is present in the UPPER OUTER quadrant which is separate from this mass. There is marked skin thickening throughout the RIGHT breast in there is marked edema/trabecular thickening throughout the breast. A pathologic low RIGHT axillary lymph node is present which measures on the order of 3 cm. No findings suspicious for malignancy in the LEFT breast. Mammographic images were processed with CAD. On correlative physical exam, the entire RIGHT breast is edematous and extremely firm to the touch. There is evidence of peau d'orange involving the skin of the breast. Targeted RIGHT breast ultrasound is performed, showing the dominant hypoechoic mass at the 12:30 o'clock position approximately 5 cm from the nipple measuring at least 3.0 x 5.1 x 6.6 cm, with central necrosis. 2 satellite masses are present adjacent to the dominant mass at the 12:30 o'clock  position approximately 4 cm from the nipple, the larger measuring approximately 1.1 x 1.3 x 1.5 cm and the smaller measuring approximately 0.6 x 1.1 x 1.3 cm; one of these masses accounts for the mammographic mass identified ANTERIOR to the dominant mass. A satellite mass is present at the 12:30 o'clock subareolar location measuring approximately 1.6 x 1.2 x 1.7 cm. A solid mass is present at the 10 o'clock position approximately 8 cm from the nipple measuring approximately 0.7 x 0.7 x 0.8 cm, corresponding to the mass identified in the OUTER breast on  mammography. There is extensive edema throughout the RIGHT breast and there is marked skin thickening throughout the RIGHT breast, measuring up to 1.3 cm in the periareolar location. Sonographic evaluation of the RIGHT axilla demonstrates 3 pathologic lymph nodes, the largest measuring approximately 2.5 x 2.0 x 2.5 cm and corresponding to the mammographic node. An adjacent node measures approximately 0.8 cm. A third node which is subpectoral in location measures approximately 1.1 cm and has cortical thickening up to 5 mm. Of note, it appeared that there were more than 3 pathologic nodes on the recent chest CT. IMPRESSION: 1. 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 2. No mammographic evidence of malignancy involving the LEFT breast. RECOMMENDATION: Ultrasound-guided core needle biopsy of the dominant RIGHT breast mass and the mass in the [place] in an attempt to confirm multicentric disease and ultrasound-guided core needle biopsy of the largest pathologic RIGHT axillary lymph node. I have discussed the findings and recommendations with the patient. The biopsies were performed subsequently same day and are reported separately. BI-RADS CATEGORY  5: Highly suggestive of malignancy. Electronically Signed   By: Evangeline Dakin M.D.   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 Pathology revealed INVASIVE DUCTAL CARCINOMA of the level 1 Right axillary lymph node. There is no definite nodal tissue present. This was found to be concordant by Dr. Peggye Fothergill. Pathology results were discussed with the patient by telephone. The patient reported doing well after the biopsies with tenderness at the sites. Post biopsy instructions and care were reviewed and questions were answered. The patient was encouraged to call [facility] for any additional concerns. The patient was referred to [facility] on April 25, 2019. Recommendation: Bilateral breast MRI due to strong family, heterogeneously dense breasts and to exclude contralateral disease. Pathology results reported by Terie Purser, RN on 04/20/2019. Electronically Signed   By: Evangeline Dakin M.D.   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 Biopsy of the dominant 6.6 cm mass in the UPPER INNER QUADRANT and biopsy of a mass in the [place] is performed. Biopsy of the largest pathologic RIGHT axillary lymph node is also performed. EXAM: ULTRASOUND-GUIDED RIGHT BREAST CORE NEEDLE BIOPSY X 2 ULTRASOUND GUIDED CORE NEEDLE BIOPSY OF A RIGHT AXILLARY NODE COMPARISON:  Previous exam(s). FINDINGS: I met with the patient and we discussed the procedure of  ultrasound-guided biopsy, including benefits and alternatives. We discussed the high likelihood of a successful procedure. We discussed the risks of the procedure, including infection, bleeding, tissue injury, clip migration, and inadequate sampling. Informed written consent was given. The usual time-out protocol was performed immediately prior to the procedure. # 1) Lesion quadrant: Dominant mass, RIGHT UPPER INNER QUADRANT. Initially, using sterile technique with chlorhexidine as skin antisepsis, 1% lidocaine and 1% lidocaine with epinephrine as local anesthetic, under direct ultrasound visualization, a 12 gauge Bard Marquee  core needle device placed through an 11 gauge introducer needle was used to perform biopsy of the dominant mass in the [place] using a LATERAL approach. At the conclusion of the procedure a ribbon shaped tissue marker clip was deployed into the biopsy cavity. # 2) Lesion quadrant: UPPER OUTER QUADRANT. Next, using sterile technique with chlorhexidine as skin antisepsis, 1% lidocaine and 1% lidocaine with epinephrine as local anesthetic, under direct ultrasound visualization, a 12 gauge Bard Marquee core needle device placed through an 11 gauge introducer needle was used perform biopsy of the mass in the [place] using a LATERAL approach. At the conclusion of the procedure, a coil shaped tissue marker clip was deployed into the biopsy cavity. # 3) Level 1 RIGHT axillary node: Using sterile technique with chlorhexidine as skin antisepsis, 1% lidocaine and 1% lidocaine with epinephrine as local anesthetic, under direct ultrasound visualization, a 14 gauge Bard Marquee core needle device placed through a 13 gauge introducer needle was used perform biopsy of the largest pathologic RIGHT axillary lymph node using an inferolateral approach. At the conclusion of the procedure, a HydroMark spiral shaped tissue marker clip was deployed into the biopsy cavity. Follow up 2 view mammogram was performed and dictated separately. IMPRESSION: Ultrasound guided biopsy of 2 RIGHT breast masses and a pathologic RIGHT axillary lymph node. No apparent complications. Electronically Signed: By: Evangeline Dakin M.D. On: 04/20/2019 07:33   Mm Clip Placement Right  Result Date: 04/20/2019 CLINICAL DATA:  Confirmation of clip placement after ultrasound-guided core needle biopsy of 2 RIGHT breast masses and a pathologic RIGHT axillary lymph node. EXAM: 3D TOMOSYNTHESIS DIAGNOSTIC RIGHT MAMMOGRAM POST ULTRASOUND BIOPSY COMPARISON:  Previous exam(s). FINDINGS: 3D tomosynthesis images were obtained following  ultrasound-guided biopsy of 2 RIGHT breast masses and a pathologic RIGHT axillary lymph node. The ribbon shaped tissue marker clip is appropriately position within the dominant mass in the [place] along its superomedial margin. The coil shaped tissue marker clip is appropriately position within the biopsied mass in the [place] at POSTERIOR depth. The [facility] spiral shaped tissue marker clip is appropriately position biopsied pathologic lymph node in the low RIGHT axilla. Expected post biopsy changes are present at each site without evidence of hematoma. IMPRESSION: 1. Appropriate positioning of the ribbon shaped tissue marker clip within the biopsied dominant mass in the [place] along its superomedial margin. 2. Appropriate positioning of the coil shaped tissue marker clip within the biopsied mass in the [place] at POSTERIOR depth. 3. Appropriate positioning of the HydroMark spiral shaped tissue marker clip within the biopsied pathologic level 1 axillary lymph node. Of note, the ribbon shaped clip and coil shaped clip are approximately 6 cm apart. Final Assessment: Post Procedure Mammograms for Marker Placement Electronically Signed   By: Evangeline Dakin M.D.   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 Pathology revealed INVASIVE DUCTAL CARCINOMA of the level 1 Right axillary lymph node. There is no definite nodal tissue present. This was found to be concordant by Dr. Peggye Fothergill. Pathology results were discussed with the patient by telephone. The patient reported doing  well after the biopsies with tenderness at the sites. Post biopsy instructions and care were reviewed and questions were answered. The patient was encouraged to call [facility] for any additional concerns. The patient was referred to [facility] at [facility] on April 25, 2019. Recommendation: Bilateral breast MRI due to strong family, heterogeneously dense breasts and to exclude contralateral disease. Pathology results reported by Terie Purser, RN on 04/20/2019. Electronically Signed   By: Evangeline Dakin M.D.   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 Biopsy of the dominant 6.6 cm mass in the UPPER INNER QUADRANT and biopsy of a mass in the [place] is performed. Biopsy of the largest pathologic RIGHT axillary lymph node is also performed. EXAM: ULTRASOUND-GUIDED RIGHT BREAST CORE NEEDLE BIOPSY X 2 ULTRASOUND GUIDED CORE NEEDLE BIOPSY OF A RIGHT AXILLARY NODE COMPARISON:  Previous exam(s). FINDINGS: I met with the patient and we discussed the procedure of ultrasound-guided biopsy, including benefits and alternatives. We discussed the high likelihood of a successful procedure. We discussed the risks of the procedure, including infection, bleeding, tissue injury, clip migration, and inadequate sampling. Informed written consent was given. The usual time-out protocol was performed immediately prior to the procedure. # 1) Lesion quadrant: Dominant mass, RIGHT UPPER INNER QUADRANT. Initially, using sterile technique with chlorhexidine as skin antisepsis, 1% lidocaine and 1% lidocaine with epinephrine as local anesthetic, under direct ultrasound visualization, a 12 gauge Bard Marquee core needle device placed through an 11 gauge introducer needle was used to perform biopsy of the  dominant mass in the [place] using a LATERAL approach. At the conclusion of the procedure a ribbon shaped tissue marker clip was deployed into the biopsy cavity. # 2) Lesion quadrant: UPPER OUTER QUADRANT. Next, using sterile technique with chlorhexidine as skin antisepsis, 1% lidocaine and 1% lidocaine with epinephrine as local anesthetic, under direct ultrasound visualization, a 12 gauge Bard Marquee core needle device placed through an 11 gauge introducer needle was used perform biopsy of the mass in the [place] using a LATERAL approach. At the conclusion of the procedure, a coil shaped tissue marker clip was deployed into the biopsy cavity. # 3) Level 1 RIGHT axillary node: Using sterile technique with chlorhexidine as skin antisepsis, 1% lidocaine and 1% lidocaine with epinephrine as local anesthetic, under direct ultrasound visualization, a 14 gauge Bard Marquee core needle device placed through a 13 gauge introducer needle was used perform biopsy of the largest pathologic RIGHT axillary lymph node using an inferolateral approach. At the conclusion of the procedure, a HydroMark spiral shaped tissue marker clip was deployed into the biopsy cavity. Follow up 2 view mammogram was performed and dictated separately. IMPRESSION: Ultrasound guided biopsy of 2 RIGHT breast masses and a pathologic RIGHT axillary lymph node. No apparent complications. Electronically  Signed: By: Evangeline Dakin M.D. 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 Pathology revealed INVASIVE DUCTAL CARCINOMA of the level 1  Right axillary lymph node. There is no definite nodal tissue present. This was found to be concordant by Dr. Peggye Fothergill. Pathology results were discussed with the patient by telephone. The patient reported doing well after the biopsies with tenderness at the sites. Post biopsy instructions and care were reviewed and questions were answered. The patient was encouraged to call [facility] for any additional concerns. The patient was referred to [facility] at [facility] on April 25, 2019. Recommendation: Bilateral breast MRI due to strong family, heterogeneously dense breasts and to exclude contralateral disease. Pathology results reported by Terie Purser, RN on 04/20/2019. Electronically Signed   By: Evangeline Dakin M.D.   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 Biopsy of the dominant 6.6 cm mass in the UPPER INNER QUADRANT and biopsy of a mass in the [place] is performed. Biopsy of the largest pathologic RIGHT axillary lymph node is also performed. EXAM: ULTRASOUND-GUIDED RIGHT BREAST CORE NEEDLE BIOPSY X 2 ULTRASOUND GUIDED CORE NEEDLE BIOPSY OF A RIGHT AXILLARY NODE COMPARISON:  Previous exam(s). FINDINGS: I met with the patient and we discussed the procedure of ultrasound-guided biopsy, including benefits and alternatives. We discussed the high likelihood of a successful procedure. We discussed the risks of the procedure, including infection, bleeding, tissue injury, clip migration, and inadequate sampling. Informed written consent was given. The usual time-out protocol was performed immediately prior to the procedure. # 1) Lesion quadrant: Dominant mass, RIGHT UPPER INNER QUADRANT. Initially, using sterile technique with chlorhexidine as skin antisepsis, 1% lidocaine and  1% lidocaine with epinephrine as local anesthetic, under direct ultrasound visualization, a 12 gauge Bard Marquee core needle device placed through an 11 gauge introducer needle was used to perform biopsy of the dominant mass in the [place] using a LATERAL approach. At the conclusion of the procedure a ribbon shaped tissue marker clip was deployed into the biopsy cavity. # 2) Lesion quadrant: UPPER OUTER QUADRANT. Next, using sterile technique with chlorhexidine as skin antisepsis, 1% lidocaine and 1% lidocaine with epinephrine as local anesthetic, under direct ultrasound visualization, a 12 gauge Bard Marquee core needle device placed through an 11 gauge introducer needle was used perform biopsy of the mass in the [place] using a LATERAL approach. At the conclusion of the procedure, a coil shaped tissue marker clip was deployed into the biopsy cavity. # 3) Level 1 RIGHT axillary node: Using sterile technique with chlorhexidine as skin antisepsis, 1% lidocaine and 1% lidocaine with epinephrine as local anesthetic, under direct ultrasound visualization, a 14 gauge Bard Marquee core needle device placed through a 13 gauge introducer needle was used perform biopsy of the largest pathologic RIGHT axillary lymph node using an inferolateral approach. At the conclusion of the procedure, a HydroMark spiral shaped tissue marker clip was deployed into the  biopsy cavity. Follow up 2 view mammogram was performed and dictated separately. IMPRESSION: Ultrasound guided biopsy of 2 RIGHT breast masses and a pathologic RIGHT axillary lymph node. No apparent complications. Electronically Signed: By: Evangeline Dakin M.D. On: 04/20/2019 07:33

## 2019-05-14 ENCOUNTER — Telehealth: Payer: Self-pay | Admitting: Genetic Counselor

## 2019-05-14 ENCOUNTER — Telehealth: Payer: Self-pay

## 2019-05-14 ENCOUNTER — Other Ambulatory Visit: Payer: Self-pay | Admitting: Oncology

## 2019-05-14 ENCOUNTER — Encounter: Payer: Self-pay | Admitting: Genetic Counselor

## 2019-05-14 DIAGNOSIS — Z1379 Encounter for other screening for genetic and chromosomal anomalies: Secondary | ICD-10-CM | POA: Insufficient documentation

## 2019-05-14 NOTE — Telephone Encounter (Signed)
-----   Message from Gardenia Phlegm, NP sent at 05/14/2019 10:53 AM EDT ----- Bone scan is neg.  Please call patient and let her know ----- Message ----- From: Interface, Rad Results In Sent: 05/10/2019   9:14 AM EDT To: Gardenia Phlegm, NP

## 2019-05-14 NOTE — Telephone Encounter (Signed)
Spoke with patient to inform her that Bone scan is negative.  Patient voiced understanding and thanks for call.

## 2019-05-14 NOTE — Telephone Encounter (Signed)
LM on VM that results are back and to please call. 

## 2019-05-15 ENCOUNTER — Inpatient Hospital Stay: Payer: Medicaid Other

## 2019-05-15 ENCOUNTER — Other Ambulatory Visit: Payer: Self-pay

## 2019-05-15 ENCOUNTER — Inpatient Hospital Stay (HOSPITAL_BASED_OUTPATIENT_CLINIC_OR_DEPARTMENT_OTHER): Payer: Medicaid Other | Admitting: Adult Health

## 2019-05-15 ENCOUNTER — Encounter: Payer: Self-pay | Admitting: Adult Health

## 2019-05-15 ENCOUNTER — Encounter: Payer: Self-pay | Admitting: General Practice

## 2019-05-15 VITALS — BP 110/84 | HR 106 | Temp 98.7°F | Resp 19 | Ht 60.0 in | Wt 187.7 lb

## 2019-05-15 VITALS — HR 99

## 2019-05-15 DIAGNOSIS — Z171 Estrogen receptor negative status [ER-]: Secondary | ICD-10-CM

## 2019-05-15 DIAGNOSIS — C50211 Malignant neoplasm of upper-inner quadrant of right female breast: Secondary | ICD-10-CM | POA: Diagnosis not present

## 2019-05-15 DIAGNOSIS — L409 Psoriasis, unspecified: Secondary | ICD-10-CM

## 2019-05-15 DIAGNOSIS — C50411 Malignant neoplasm of upper-outer quadrant of right female breast: Secondary | ICD-10-CM | POA: Diagnosis not present

## 2019-05-15 DIAGNOSIS — R197 Diarrhea, unspecified: Secondary | ICD-10-CM

## 2019-05-15 DIAGNOSIS — R609 Edema, unspecified: Secondary | ICD-10-CM

## 2019-05-15 DIAGNOSIS — Z87891 Personal history of nicotine dependence: Secondary | ICD-10-CM

## 2019-05-15 DIAGNOSIS — Z5111 Encounter for antineoplastic chemotherapy: Secondary | ICD-10-CM | POA: Diagnosis not present

## 2019-05-15 DIAGNOSIS — Z833 Family history of diabetes mellitus: Secondary | ICD-10-CM

## 2019-05-15 DIAGNOSIS — Z841 Family history of disorders of kidney and ureter: Secondary | ICD-10-CM

## 2019-05-15 DIAGNOSIS — E860 Dehydration: Secondary | ICD-10-CM

## 2019-05-15 DIAGNOSIS — R590 Localized enlarged lymph nodes: Secondary | ICD-10-CM

## 2019-05-15 DIAGNOSIS — F1220 Cannabis dependence, uncomplicated: Secondary | ICD-10-CM

## 2019-05-15 DIAGNOSIS — Z820 Family history of epilepsy and other diseases of the nervous system: Secondary | ICD-10-CM

## 2019-05-15 DIAGNOSIS — R918 Other nonspecific abnormal finding of lung field: Secondary | ICD-10-CM

## 2019-05-15 DIAGNOSIS — Z802 Family history of malignant neoplasm of other respiratory and intrathoracic organs: Secondary | ICD-10-CM

## 2019-05-15 DIAGNOSIS — Z803 Family history of malignant neoplasm of breast: Secondary | ICD-10-CM

## 2019-05-15 DIAGNOSIS — C50811 Malignant neoplasm of overlapping sites of right female breast: Secondary | ICD-10-CM

## 2019-05-15 DIAGNOSIS — Z79899 Other long term (current) drug therapy: Secondary | ICD-10-CM

## 2019-05-15 DIAGNOSIS — M542 Cervicalgia: Secondary | ICD-10-CM

## 2019-05-15 DIAGNOSIS — I2510 Atherosclerotic heart disease of native coronary artery without angina pectoris: Secondary | ICD-10-CM

## 2019-05-15 DIAGNOSIS — R234 Changes in skin texture: Secondary | ICD-10-CM

## 2019-05-15 DIAGNOSIS — G8929 Other chronic pain: Secondary | ICD-10-CM

## 2019-05-15 DIAGNOSIS — K219 Gastro-esophageal reflux disease without esophagitis: Secondary | ICD-10-CM

## 2019-05-15 DIAGNOSIS — Z800 Family history of malignant neoplasm of digestive organs: Secondary | ICD-10-CM

## 2019-05-15 DIAGNOSIS — Z72820 Sleep deprivation: Secondary | ICD-10-CM

## 2019-05-15 DIAGNOSIS — Z7951 Long term (current) use of inhaled steroids: Secondary | ICD-10-CM

## 2019-05-15 LAB — CBC WITH DIFFERENTIAL/PLATELET
Abs Immature Granulocytes: 0.05 10*3/uL (ref 0.00–0.07)
Basophils Absolute: 0 10*3/uL (ref 0.0–0.1)
Basophils Relative: 1 %
Eosinophils Absolute: 0.1 10*3/uL (ref 0.0–0.5)
Eosinophils Relative: 1 %
HCT: 39.2 % (ref 36.0–46.0)
Hemoglobin: 13 g/dL (ref 12.0–15.0)
Immature Granulocytes: 1 %
Lymphocytes Relative: 23 %
Lymphs Abs: 1.9 10*3/uL (ref 0.7–4.0)
MCH: 29.6 pg (ref 26.0–34.0)
MCHC: 33.2 g/dL (ref 30.0–36.0)
MCV: 89.3 fL (ref 80.0–100.0)
Monocytes Absolute: 0.2 10*3/uL (ref 0.1–1.0)
Monocytes Relative: 3 %
Neutro Abs: 6.1 10*3/uL (ref 1.7–7.7)
Neutrophils Relative %: 71 %
Platelets: 380 10*3/uL (ref 150–400)
RBC: 4.39 MIL/uL (ref 3.87–5.11)
RDW: 13.4 % (ref 11.5–15.5)
WBC: 8.4 10*3/uL (ref 4.0–10.5)
nRBC: 0 % (ref 0.0–0.2)

## 2019-05-15 LAB — COMPREHENSIVE METABOLIC PANEL
ALT: 27 U/L (ref 0–44)
AST: 15 U/L (ref 15–41)
Albumin: 3.8 g/dL (ref 3.5–5.0)
Alkaline Phosphatase: 95 U/L (ref 38–126)
Anion gap: 10 (ref 5–15)
BUN: 8 mg/dL (ref 6–20)
CO2: 25 mmol/L (ref 22–32)
Calcium: 9.4 mg/dL (ref 8.9–10.3)
Chloride: 99 mmol/L (ref 98–111)
Creatinine, Ser: 1.13 mg/dL — ABNORMAL HIGH (ref 0.44–1.00)
GFR calc Af Amer: >60 mL/min (ref >60)
GFR calc non Af Amer: >60 mL/min (ref >60)
Glucose, Bld: 126 mg/dL — ABNORMAL HIGH (ref 70–99)
Potassium: 3.7 mmol/L (ref 3.5–5.1)
Sodium: 134 mmol/L — ABNORMAL LOW (ref 135–145)
Total Bilirubin: 0.4 mg/dL (ref 0.3–1.2)
Total Protein: 8 g/dL (ref 6.5–8.1)

## 2019-05-15 MED ORDER — HEPARIN SOD (PORK) LOCK FLUSH 100 UNIT/ML IV SOLN
500.0000 [IU] | Freq: Once | INTRAVENOUS | Status: AC | PRN
  Administered 2019-05-15: 500 [IU]
  Filled 2019-05-15: qty 5

## 2019-05-15 MED ORDER — SODIUM CHLORIDE 0.9% FLUSH
10.0000 mL | INTRAVENOUS | Status: DC | PRN
  Administered 2019-05-15: 17:00:00 10 mL
  Filled 2019-05-15: qty 10

## 2019-05-15 MED ORDER — FAMOTIDINE IN NACL 20-0.9 MG/50ML-% IV SOLN
20.0000 mg | Freq: Once | INTRAVENOUS | Status: AC
  Administered 2019-05-15: 15:00:00 20 mg via INTRAVENOUS

## 2019-05-15 MED ORDER — SODIUM CHLORIDE 0.9% FLUSH
10.0000 mL | INTRAVENOUS | Status: DC | PRN
  Administered 2019-05-15: 13:00:00 10 mL
  Filled 2019-05-15: qty 10

## 2019-05-15 MED ORDER — DIPHENHYDRAMINE HCL 50 MG/ML IJ SOLN
25.0000 mg | Freq: Once | INTRAMUSCULAR | Status: AC
  Administered 2019-05-15: 14:00:00 25 mg via INTRAVENOUS

## 2019-05-15 MED ORDER — SODIUM CHLORIDE 0.9 % IV SOLN
Freq: Once | INTRAVENOUS | Status: AC
  Administered 2019-05-15: 14:00:00 via INTRAVENOUS
  Filled 2019-05-15: qty 250

## 2019-05-15 MED ORDER — POTASSIUM CHLORIDE IN NACL 20-0.9 MEQ/L-% IV SOLN
Freq: Once | INTRAVENOUS | Status: AC
  Administered 2019-05-15: 14:00:00 via INTRAVENOUS
  Filled 2019-05-15: qty 1000

## 2019-05-15 MED ORDER — DEXAMETHASONE SODIUM PHOSPHATE 10 MG/ML IJ SOLN
INTRAMUSCULAR | Status: AC
  Filled 2019-05-15: qty 1

## 2019-05-15 MED ORDER — DEXAMETHASONE SODIUM PHOSPHATE 10 MG/ML IJ SOLN
10.0000 mg | Freq: Once | INTRAMUSCULAR | Status: AC
  Administered 2019-05-15: 10 mg via INTRAVENOUS

## 2019-05-15 MED ORDER — SODIUM CHLORIDE 0.9 % IV SOLN
80.0000 mg/m2 | Freq: Once | INTRAVENOUS | Status: AC
  Administered 2019-05-15: 162 mg via INTRAVENOUS
  Filled 2019-05-15: qty 27

## 2019-05-15 MED ORDER — FAMOTIDINE IN NACL 20-0.9 MG/50ML-% IV SOLN
INTRAVENOUS | Status: AC
  Filled 2019-05-15: qty 50

## 2019-05-15 MED ORDER — SODIUM CHLORIDE 0.9 % IV SOLN: INTRAVENOUS | Status: DC

## 2019-05-15 MED ORDER — SODIUM CHLORIDE 0.9 % IV SOLN: 10.0000 mg | Freq: Once | INTRAVENOUS | Status: DC

## 2019-05-15 MED ORDER — DIPHENHYDRAMINE HCL 50 MG/ML IJ SOLN
INTRAMUSCULAR | Status: AC
  Filled 2019-05-15: qty 1

## 2019-05-15 NOTE — Progress Notes (Addendum)
Terri Estes  Telephone:([phone]) [phone] Fax:([phone]) [phone]    ID: Terri Estes DOB: 10-Jul-1986  MR#: [id number]  DDU#:[id number]  Patient Care Team: Patient, No Pcp Per as PCP - General (General Practice) Alphonsa Overall, MD as Consulting Physician (General Surgery) Magrinat, [name], MD as Consulting Physician (Oncology) Kyung Rudd, MD as Consulting Physician (Radiation Oncology) Rockwell Germany, RN as Oncology Nurse Navigator Mauro Kaufmann, RN as Oncology Nurse Navigator Donnamae Jude, MD as Consulting Physician (Obstetrics and Gynecology) Pavelock, Ralene Bathe, MD as Consulting Physician (Internal Medicine) OTHER MD:   CHIEF COMPLAINT: Triple negative breast cancer  CURRENT TREATMENT: Neoadjuvant chemotherapy/immunotherapy   HISTORY OF CURRENT ILLNESS: From the original intake note:  Terri Estes presented with swelling along the left side of the face with neck pain. She then underwent a neck CT on 04/11/2019 showing: Enlarged left level 2 lymph nodes with associated inflammatory change including stranding of the adjacent fat and thickening of the platysma. While malignancy is not excluded, this is most concerning for infection. No focal abscess is present. Hypoattenuation within 1 of the left level 2 lymph nodes likely represents central necrosis or suppuration of the node. No primary malignancy or other abscess. Left upper lobe pulmonary nodules may be inflammatory. The largest measures 0.7 x 0.7 x 0.6 cm. CT of the chest with contrast may be useful for further evaluation.  She also underwent a chest CT on the same day showing: Complex right breast mass measuring 5.6 x 4.5 x 4.7 cm consistent with a primary breast carcinoma. Malignant right axillary adenopathy measuring up to 3.1 cm. Multiple bilateral pulmonary nodules are concerning for metastatic disease. Given the right breast mass, the left cervical lymph nodes are more likely malignant.  She then  underwent bilateral diagnostic mammography with tomography and right breast ultrasonography at [facility] on 04/19/2019 showing: Breast Density Category C; findings which are highly suspicious for multicentric inflammatory right breast cancer with right axillary nodal metastatic disease; no mammographic evidence of malignancy involving the left breast.  Accordingly on 04/19/2019 she proceeded to biopsy of the right breast area in question. The pathology from this procedure showed ([id number]): invasive ductal carcinoma, grade III, upper inner quadrant, 12:30 o'clock, 5.0 cm from the nipple. Prognostic indicators significant for: estrogen receptor, 0% negative and progesterone receptor, 0% negative. Proliferation marker Ki67 at 90%. HER2 negative (0+).  Additional biopsies of the right breast and right axilla were performed on the same day. The pathology from this procedure showed ([id number]): 2. Breast, right, needle core biopsy, satellite mass UOQ, 10 o'clock, 8cmfn  - Invasive ductal carcinoma, grade III 3. Lymph node, needle/core biopsy, level 1 right axilla   - Invasive ductal carcinoma, grade III  The patient's subsequent history is as detailed below.   INTERVAL HISTORY: Terri Estes is here today for follow up and evaluation of her estrogen negative breast cancer. She is unaccompanied.  She is starting neoadjuvant treatment for her locally advanced breast cancer.    Terri Estes received Pembrolizumab starting last week.  She tolerated this well.  She says she took a nap, but didn't note any other issues.    Terri Estes started chemotherapy last week with Paclitaxel and Carboplatin.  She received the weekly Paclitaxel at 2m/m2 and every three week Carboplatin dose with AUC of 6.  [facility] had increased diarrhea [facility]  She was given IV fluids and instructed on how to take imodium and was written for qSweden  Terri Estes notes that once she started the [place]  she started feeling better, and her diarrhea has since resolved.    REVIEW OF SYSTEMS: Terri Estes has been increasingly fatigued.  She has decreased appetite and is concerned about her decreased desire to eat.  She is having some intermittent tingling in her hands and feet.  This is not in her fingertips or toes.  She denies any fever, chills, cough, chest pain, or shortness of breath.  She hasn't had any mucositis.  She denies any constipation/bladder concerns.  She denies any increased nausea/vomiting.  She has taken her anti emetics as prescribed.  Otherwise a detailed ROS was non contributory.    PAST MEDICAL HISTORY: Past Medical History:  Diagnosis Date  . Anemia    after pregnancy  . Anxiety    severe not taking meds  . Asthma    inhaler  4x per day  . Chronic pain   . Family history of breast cancer   . History of substance abuse ([place])   . Pregnancy induced hypertension    takes medication now.  . Psoriasis   . Trichomonas infection   . Vaginal Pap smear, abnormal      PAST SURGICAL HISTORY: Past Surgical History:  Procedure Laterality Date  . CESAREAN SECTION  05/26/2008  . CESAREAN SECTION N/A 07/10/2017   Procedure: CESAREAN SECTION;  Surgeon: Florian Buff, MD;  Location: [facility];  Service: Obstetrics;  Laterality: N/A;  . CESAREAN SECTION N/A 09/23/2018   Procedure: CESAREAN SECTION;  Surgeon: Donnamae Jude, MD;  Location: [facility];  Service: Obstetrics;  Laterality: N/A;  . COLPOSCOPY    . PORTACATH PLACEMENT Left 04/30/2019   Procedure: INSERTION PORT-A-CATH WITH ULTRASOUND;  Surgeon: Alphonsa Overall, MD;  Location: [facility];  Service: General;  Laterality: Left;  Left Subclavian vein  . WISDOM TOOTH EXTRACTION       FAMILY HISTORY: Family History  Problem Relation Age of Onset  . Kidney disease Maternal Grandmother   . Breast cancer Maternal Grandmother 28       metastatic to liver; d. 72  . Kidney disease Maternal Grandfather   . Kidney  disease Paternal Grandmother   . Diabetes Paternal Grandmother   . Breast cancer Paternal Grandmother   . Kidney disease Paternal Grandfather   . Breast cancer Paternal Aunt   . Cerebral palsy Child   . Breast cancer Paternal Great-grandmother   . Pancreatic cancer Other        PGMs brother   . Throat cancer Paternal Uncle   . Breast cancer Other        PGMs sister   Trayce's father is living at age 31. Patients' mother is also living at age 67. The patient has 0 brothers and 5 sisters. Patient denies anyone in her family having ovarian, prostate, or pancreatic cancer. Her maternal grandmother was diagnosed with breast cancer, unsure what age. On her father's side, she reports her grandmother had cysts in her breasts, her great-aunt might have had breast cancer, and her great-grandmother was diagnosed with breast cancer at an older age.   GYNECOLOGIC HISTORY:  Patient's last menstrual period was 04/20/2019.  She had an emergent C-section in 09/23/2018 for abruptio placenta at 30 weeks pregnancy, also has a history of eclampsia Menarche:    years old Age at first live birth: 33 years old [place] P: 3 LMP: 04/18/2019 Contraceptive: none HRT: none  Hysterectomy?: no BSO?: no   SOCIAL HISTORY: (Current as of 04/25/2019)  Terri Estes is currently unemployed. She previously worked at [employer]. She is currently engaged. Fiance [name] runs a Midwife and works other odd jobs. Daughter Demetrius Charity, age 9, was abused by her father at 77 months and has cerebral palsy. Son [name], age 59, has severe asthma and is developmentally disabled. Daughter Yetta Glassman was born on 09/23/2018 prematurely and remains on oxygen at home.   ADVANCED DIRECTIVES: not in place.    HEALTH MAINTENANCE: Social History   Tobacco Use  . Smoking status: Former Smoker    Packs/day: 0.50    Years: 5.00    Pack years: 2.50    Types: Cigarettes    Last attempt to quit: 04/10/2019    Years since quitting: 0.0   . Smokeless tobacco: Never Used  . Tobacco comment: trying to quit  Substance Use Topics  . Alcohol use: No  . Drug use: Yes    Frequency: 7.0 times per week    Types: Marijuana    Comment: subutex    Colonoscopy: never done  PAP: 05/10/2018  Bone density: never done Mammography: first performed following abnormal CT  No Known Allergies  Current Outpatient Medications  Medication Sig Dispense Refill  . albuterol (PROVENTIL HFA;VENTOLIN HFA) 108 (90 Base) MCG/ACT inhaler Inhale 2 puffs into the lungs 4 (four) times daily. 1 Inhaler 2  . buprenorphine (SUBUTEX) 2 MG SUBL SL tablet Place 4 mg under the tongue at bedtime.    . buprenorphine (SUBUTEX) 8 MG SUBL SL tablet Place 8 mg under the tongue daily.     . cholestyramine (QUESTRAN) 4 g packet Take 1 packet (4 g total) by mouth 3 (three) times daily with meals. 60 each 12  . cyclobenzaprine (FLEXERIL) 10 MG tablet Take 1 tablet (10 mg total) by mouth 3 (three) times daily as needed for muscle spasms. 90 tablet 2  . dexamethasone (DECADRON) 4 MG tablet Take 4 mg by mouth daily. Take 2 tablets by mouth daily on the day after chemotherapy, and then take 2 tablets twice daily for 2 days afterwards.  Take with food.    . enalapril (VASOTEC) 10 MG tablet TAKE 1 TABLET BY MOUTH DAILY (Patient taking differently: Take 10 mg by mouth daily. ) 90 tablet 0  . ferrous sulfate 325 (65 FE) MG tablet Take 1 tablet (325 mg total) by mouth 3 (three) times daily with meals. 90 tablet 3  . gabapentin (NEURONTIN) 300 MG capsule Take 1 capsule (300 mg total) by mouth 3 (three) times daily. 90 capsule 0  . omeprazole (PRILOSEC) 40 MG capsule Take 1 capsule (40 mg total) by mouth daily. 30 capsule 5  . ondansetron (ZOFRAN) 8 MG tablet Take 1 tablet (8 mg total) by mouth every 8 (eight) hours as needed for nausea or vomiting. 20 tablet 0  . PARoxetine (PAXIL) 10 MG tablet Take 1 tablet (10 mg total) by mouth daily. 30 tablet 5  . Prenatal MV-Min-FA-Omega-3  (PRENATAL GUMMIES/DHA & FA) 0.4-32.5 MG CHEW Chew 2 each by mouth daily.     No current facility-administered medications for this visit.      OBJECTIVE:   Vitals:   05/15/19 1313  BP: 110/84  Pulse: (!) 106  Resp: 19  Temp: 98.7 F (37.1 C)  SpO2: 99%     Body mass index is 36.66 kg/m.   Wt Readings from Last 3 Encounters:  05/15/19 187 lb 11.2 oz (85.1 kg)  05/08/19 197 lb 12.8 oz (89.7 kg)  04/26/19 195 lb 3.2 oz (  88.5 kg)      ECOG FS:1 - Symptomatic but completely ambulatory  GENERAL: Patient is a well appearing female in no acute distress HEENT:  Sclerae anicteric.  Oropharynx clear and moist. No ulcerations or evidence of oropharyngeal candidiasis. Neck is supple.  NODES:  No cervical, supraclavicular, or axillary lymphadenopathy palpated.  BREAST EXAM:  Right breast is improving, and smaller in size, with decreased skin changes present LUNGS:  Clear to auscultation bilaterally.  No wheezes or rhonchi. HEART:  Regular rate and rhythm. No murmur appreciated. ABDOMEN:  Soft, nontender.  Positive, normoactive bowel sounds. No organomegaly palpated. MSK:  No focal spinal tenderness to palpation. Full range of motion bilaterally in the upper extremities. EXTREMITIES:  No peripheral edema.   SKIN:  Clear with no obvious rashes or skin changes. No nail dyscrasia. NEURO:  Nonfocal. Well oriented. Anxious affect.         LAB RESULTS:  CMP     Component Value Date/Time   NA 133 (L) 05/11/2019 1305   NA 137 05/10/2018 1535   K 4.0 05/11/2019 1305   CL 99 05/11/2019 1305   CO2 26 05/11/2019 1305   GLUCOSE 94 05/11/2019 1305   BUN 11 05/11/2019 1305   BUN 7 05/10/2018 1535   CREATININE 0.78 05/11/2019 1305   CALCIUM 9.4 05/11/2019 1305   PROT 7.8 05/11/2019 1305   PROT 7.2 05/10/2018 1535   ALBUMIN 3.8 05/11/2019 1305   ALBUMIN 4.2 05/10/2018 1535   AST 20 05/11/2019 1305   ALT 44 05/11/2019 1305   ALKPHOS 92 05/11/2019 1305   BILITOT 0.4 05/11/2019 1305    GFRNONAA >60 05/11/2019 1305   GFRAA >60 05/11/2019 1305    No results found for: TOTALPROTELP, ALBUMINELP, A1GS, A2GS, BETS, BETA2SER, GAMS, MSPIKE, SPEI  No results found for: KPAFRELGTCHN, LAMBDASER, KAPLAMBRATIO  Lab Results  Component Value Date   WBC 8.4 05/15/2019   NEUTROABS 6.1 05/15/2019   HGB 13.0 05/15/2019   HCT 39.2 05/15/2019   MCV 89.3 05/15/2019   PLT 380 05/15/2019    '@LASTCHEMISTRY' @  No results found for: LABCA2  No components found for: [id number]  No results for input(s): INR in the last 168 hours.  No results found for: LABCA2  No results found for: LEX517  No results found for: GYF749  No results found for: SWH675  No results found for: CA2729  No components found for: HGQUANT  No results found for: CEA1 / No results found for: CEA1   No results found for: AFPTUMOR  No results found for: CHROMOGRNA  No results found for: PSA1  Appointment on 05/15/2019  Component Date Value Ref Range Status  . WBC 05/15/2019 8.4  4.0 - 10.5 K/uL Final  . RBC 05/15/2019 4.39  3.87 - 5.11 MIL/uL Final  . Hemoglobin 05/15/2019 13.0  12.0 - 15.0 g/dL Final  . HCT 05/15/2019 39.2  36.0 - 46.0 % Final  . MCV 05/15/2019 89.3  80.0 - 100.0 fL Final  . MCH 05/15/2019 29.6  26.0 - 34.0 pg Final  . MCHC 05/15/2019 33.2  30.0 - 36.0 g/dL Final  . RDW 05/15/2019 13.4  11.5 - 15.5 % Final  . Platelets 05/15/2019 380  150 - 400 K/uL Final  . nRBC 05/15/2019 0.0  0.0 - 0.2 % Final  . Neutrophils Relative % 05/15/2019 71  % Final  . Neutro Abs 05/15/2019 6.1  1.7 - 7.7 K/uL Final  . Lymphocytes Relative 05/15/2019 23  % Final  . Lymphs Abs  05/15/2019 1.9  0.7 - 4.0 K/uL Final  . Monocytes Relative 05/15/2019 3  % Final  . Monocytes Absolute 05/15/2019 0.2  0.1 - 1.0 K/uL Final  . Eosinophils Relative 05/15/2019 1  % Final  . Eosinophils Absolute 05/15/2019 0.1  0.0 - 0.5 K/uL Final  . Basophils Relative 05/15/2019 1  % Final  . Basophils Absolute 05/15/2019  0.0  0.0 - 0.1 K/uL Final  . Immature Granulocytes 05/15/2019 1  % Final  . Abs Immature Granulocytes 05/15/2019 0.05  0.00 - 0.07 K/uL Final   Performed at [facility], [address] [place], [place] [address]    (this displays the last labs from the last 3 days)  No results found for: TOTALPROTELP, ALBUMINELP, A1GS, A2GS, BETS, BETA2SER, GAMS, MSPIKE, SPEI (this displays SPEP labs)  No results found for: KPAFRELGTCHN, LAMBDASER, KAPLAMBRATIO (kappa/lambda light chains)  Lab Results  Component Value Date   HGBA 97.4 12/16/2016   (Hemoglobinopathy evaluation)   No results found for: LDH  No results found for: IRON, TIBC, IRONPCTSAT (Iron and TIBC)  No results found for: FERRITIN  Urinalysis    Component Value Date/Time   COLORURINE COLORLESS (A) 08/20/2018 0704   APPEARANCEUR CLEAR 08/20/2018 0704   LABSPEC 1.003 (L) 08/20/2018 0704   PHURINE 7.0 08/20/2018 0704   GLUCOSEU NEGATIVE 08/20/2018 0704   HGBUR NEGATIVE 08/20/2018 0704   BILIRUBINUR NEGATIVE 08/20/2018 0704   KETONESUR NEGATIVE 08/20/2018 0704   PROTEINUR NEGATIVE 08/20/2018 0704   UROBILINOGEN 4.0 (H) 01/13/2018 1417   NITRITE NEGATIVE 08/20/2018 0704   LEUKOCYTESUR NEGATIVE 08/20/2018 0704     STUDIES:   ELIGIBLE FOR AVAILABLE RESEARCH PROTOCOL: no   ASSESSMENT: 33 y.o. [place] woman status post right breast biopsy x2 and right axillary lymph node biopsy 04/19/2019 for a clinical T4 N2 MX, stage IIIC invasive ductal carcinoma, grade 3, triple negative, with an MIB-1 of 90%  (a) chest CT scan 04/11/2019 shows multiple subcentimeter lung nodules  (b) PET scan denied by insurance, CT A/P and bone scan due 05/09/2019  (c) baseline breast MRI 04/30/2019 shows involvement of all 4 quadrants in the right breast as well as right axillary subpectoral and internal mammary lymphadenopathy.  Left breast was unremarkable  (1) neoadjuvant chemotherapy will consist of   (a)  pembrolizumab started 05/03/2019, repeated every 21 days   (b) paclitaxel weekly x12 with carboplatin every 21 days x 4, started 05/08/2019  (c) cyclophosphamide and doxorubicin in dose dense fashion x4    (2) definitive surgery pending  (3) adjuvant radiation to follow  (4) genetics testing  (5) goserelin started 05/08/2019   PLAN:  Terri Estes is doing moderately well today.  She had a difficult time with carboplatin AUC of 6.  She met with Dr. Jana Hakim and starting with week 4, she will receive weekly Paclitaxel and weekly Carboplatin at AUC 2.  This should be easier on her.  She will receive Paclitaxel alone today.  Terri Estes is dehydrated due to decreased appetite, taste issues, and previous diarrhea that has now resovled.  I wrote for her to receive one liter of normal saline with potassium in it.  I encouraged Terri Estes to hang in there and continue on course as we are getting the desired effect from her treatment because her breast cancer is shrinking.    I recommended she take short quick walks that will perhaps help with her feelings of fatigue.    Terri Estes will return in one week for labs, f/u, and her  next treatment with her weekly Paclitaxel and every 3 week Pembrolizumab.    Terri Estes has a good understanding of the overall plan. She agrees with it.  She will call with any problems that may develop before her next visit here.    Wilber Bihari, NP  05/15/19 1:19 PM Medical Oncology and Hematology [facility] [address] [place], [place] [address] Tel. [phone]    Fax. [phone]     ADDENDUM: I met with Terri Estes, who had a very hard time with her first cycle of chemotherapy, as detailed above.  I reassured her that today's treatment would be much simpler: She is only receiving paclitaxel.  I do not anticipate her having significant issues with this and we are going to add some fluids to today's treatment as well.  I also let her know that when we  give her the next dose of carboplatin we are going to go at a lower dose weekly x3, instead of one big dose every 3 weeks.  I do not think she would be able to tolerate the larger dose.  The good news is that she is already having a significant response to treatment and she feels her right breast is "just about back to normal".  She will see me again next week with her third Taxol dose and she will receive pembrolizumab that day as well.   I personally saw this patient and performed a substantive portion of this encounter with the listed APP documented above.   Chauncey Cruel, MD Medical Oncology and Hematology [facility] [address] [place], [place] [address] Tel. [phone]    Fax. [phone]

## 2019-05-15 NOTE — Patient Instructions (Signed)
[facility] Discharge Instructions for Patients Receiving Chemotherapy  Today you received the following chemotherapy agents Taxol  To help prevent nausea and vomiting after your treatment, we encourage you to take your nausea medication.    If you develop nausea and vomiting that is not controlled by your nausea medication, call the clinic.   BELOW ARE SYMPTOMS THAT SHOULD BE REPORTED IMMEDIATELY:  *FEVER GREATER THAN 100.5 F  *CHILLS WITH OR WITHOUT FEVER  NAUSEA AND VOMITING THAT IS NOT CONTROLLED WITH YOUR NAUSEA MEDICATION  *UNUSUAL SHORTNESS OF BREATH  *UNUSUAL BRUISING OR BLEEDING  TENDERNESS IN MOUTH AND THROAT WITH OR WITHOUT PRESENCE OF ULCERS  *URINARY PROBLEMS  *BOWEL PROBLEMS  UNUSUAL RASH Items with * indicate a potential emergency and should be followed up as soon as possible.  Feel free to call the clinic should you have any questions or concerns. The clinic phone number is [phone].  Please show the CHEMO ALERT CARD at check-in to the Emergency Department and triage nurse.   

## 2019-05-15 NOTE — Progress Notes (Signed)
[facility] CSW Progress Notes  Request received from MD to connect w patient for any available support services.  Spoke by phone w patient who was currently in infusion room.  As she was not in position to talk extensively, CSW will call her back later in week to continue to assess for needs and support.  Pt has case manager at [facility] RN [phone] or [phone]).  Pt states she has 3 children, all of whom have developmental disabilities.  RN CM is working to support family and wants to help as needed w resources.  Pt is also connnected w Robbilene Lawhorne at [employer] - per patient, agency is working on ways to assist w safe and healthy home environment.  CSW left VM for Ms Barbaraann Faster and also emailed Ms Karis Juba for status update.  Per patient, Ms Karis Juba will be contacting her this week w possible options for addressing environmental conditions in the home.    [name], LCSW Clinical Social Worker Phone:  [phone]

## 2019-05-15 NOTE — Telephone Encounter (Signed)
LM with husband that we have her results and to have her CB.  I left my CB number.

## 2019-05-16 ENCOUNTER — Encounter: Payer: Self-pay | Admitting: General Practice

## 2019-05-16 ENCOUNTER — Ambulatory Visit: Payer: Self-pay | Admitting: Genetic Counselor

## 2019-05-16 ENCOUNTER — Telehealth: Payer: Self-pay | Admitting: *Deleted

## 2019-05-16 DIAGNOSIS — Z171 Estrogen receptor negative status [ER-]: Secondary | ICD-10-CM

## 2019-05-16 DIAGNOSIS — C50811 Malignant neoplasm of overlapping sites of right female breast: Secondary | ICD-10-CM

## 2019-05-16 DIAGNOSIS — Z1379 Encounter for other screening for genetic and chromosomal anomalies: Secondary | ICD-10-CM

## 2019-05-16 NOTE — Telephone Encounter (Signed)
Revealed negative genetic testing.  Discussed that we do not know why she has breast cancer or why there is cancer in the family. It could be due to a different gene that we are not testing, or maybe our current technology may not be able to pick something up.  It will be important for her to keep in contact with genetics to keep up with whether additional testing may be needed. 

## 2019-05-16 NOTE — Telephone Encounter (Signed)
Left message for a return phone call to check in with patient to see how she is feeling.

## 2019-05-16 NOTE — Progress Notes (Signed)
HPI:  Ms. Macht was previously seen in the [facility] due to a personal and family history of breast cancer and concerns regarding a hereditary predisposition to cancer. Please refer to our prior cancer genetics clinic note for more information regarding our discussion, assessment and recommendations, at the time. Ms. Rishel recent genetic test results were disclosed to her, as were recommendations warranted by these results. These results and recommendations are discussed in more detail below.  CANCER HISTORY:    Malignant neoplasm of overlapping sites of right breast in female, estrogen receptor negative ([place])   04/25/2019 Initial Diagnosis    Malignant neoplasm of overlapping sites of right breast in female, estrogen receptor negative ([place])    05/03/2019 -  Chemotherapy    The patient had pembrolizumab (KEYTRUDA) 200 mg in sodium chloride 0.9 % 50 mL chemo infusion, 200 mg, Intravenous, Once, 1 of 6 cycles Administration: 200 mg (05/03/2019)  for chemotherapy treatment.     05/08/2019 - 05/08/2019 Chemotherapy    The patient had DOXOrubicin (ADRIAMYCIN) chemo injection 120 mg, 60 mg/m2, Intravenous,  Once, 0 of 4 cycles palonosetron (ALOXI) injection 0.25 mg, 0.25 mg, Intravenous,  Once, 0 of 8 cycles pegfilgrastim-cbqv (UDENYCA) injection 6 mg, 6 mg, Subcutaneous, Once, 0 of 4 cycles CARBOplatin (PARAPLATIN) 300 mg in sodium chloride 0.9 % 100 mL chemo infusion, 300 mg (100 % of original dose 300 mg), Intravenous,  Once, 0 of 4 cycles Dose modification:   (original dose 300 mg, Cycle 1) cyclophosphamide (CYTOXAN) in sodium chloride 0.9 % 250 mL chemo infusion, , Intravenous,  Once, 0 of 4 cycles PACLitaxel (TAXOL) 162 mg in sodium chloride 0.9 % 250 mL chemo infusion (</= '80mg'$ /m2), 80 mg/m2 = 162 mg, Intravenous,  Once, 0 of 4 cycles fosaprepitant (EMEND) 150 mg, dexamethasone (DECADRON) 12 mg in sodium chloride 0.9 % 145 mL IVPB, , Intravenous,  Once, 0 of 8 cycles  for chemotherapy treatment.     05/08/2019 -  Chemotherapy    The patient had DOXOrubicin (ADRIAMYCIN) chemo injection 120 mg, 60 mg/m2, Intravenous,  Once, 0 of 4 cycles palonosetron (ALOXI) injection 0.25 mg, 0.25 mg, Intravenous,  Once, 1 of 8 cycles Administration: 0.25 mg (05/08/2019) pegfilgrastim-cbqv (UDENYCA) injection 6 mg, 6 mg, Subcutaneous, Once, 0 of 4 cycles CARBOplatin (PARAPLATIN) 750 mg in sodium chloride 0.9 % 250 mL chemo infusion, 750 mg (100 % of original dose 750 mg), Intravenous,  Once, 1 of 4 cycles Dose modification:   (original dose 750 mg, Cycle 1) Administration: 750 mg (05/08/2019) cyclophosphamide (CYTOXAN) in sodium chloride 0.9 % 250 mL chemo infusion, , Intravenous,  Once, 0 of 4 cycles PACLitaxel (TAXOL) 162 mg in sodium chloride 0.9 % 250 mL chemo infusion (</= '80mg'$ /m2), 80 mg/m2 = 162 mg, Intravenous,  Once, 1 of 4 cycles Administration: 162 mg (05/08/2019), 162 mg (05/15/2019) fosaprepitant (EMEND) 150 mg, dexamethasone (DECADRON) 12 mg in sodium chloride 0.9 % 145 mL IVPB, , Intravenous,  Once, 1 of 8 cycles Administration:  (05/08/2019)  for chemotherapy treatment.     05/14/2019 Genetic Testing    Negative genetic testing on the common hereditary cancer panel.  The Common Hereditary Gene Panel offered by [facility] includes sequencing and/or deletion duplication testing of the following 48 genes: APC, ATM, AXIN2, BARD1, BMPR1A, BRCA1, BRCA2, BRIP1, CDH1, CDK4, CDKN2A (p14ARF), CDKN2A (p16INK4a), CHEK2, CTNNA1, DICER1, EPCAM (Deletion/duplication testing only), GREM1 (promoter region deletion/duplication testing only), KIT, MEN1, MLH1, MSH2, MSH3, MSH6, MUTYH, NBN, NF1, NHTL1, PALB2, PDGFRA, PMS2, POLD1,  POLE, PTEN, RAD50, RAD51C, RAD51D, RNF43, SDHB, SDHC, SDHD, SMAD4, SMARCA4. STK11, TP53, TSC1, TSC2, and VHL.  The following genes were evaluated for sequence changes only: SDHA and HOXB13 c.251G>A variant only. The report date is May 14, 2019.     FAMILY HISTORY:   We obtained a detailed, 4-generation family history.  Significant diagnoses are listed below: Family History  Problem Relation Age of Onset  . Kidney disease Maternal Grandmother   . Breast cancer Maternal Grandmother 28       metastatic to liver; d. 78  . Kidney disease Maternal Grandfather   . Kidney disease Paternal Grandmother   . Diabetes Paternal Grandmother   . Breast cancer Paternal Grandmother   . Kidney disease Paternal Grandfather   . Breast cancer Paternal Aunt   . Cerebral palsy Child   . Breast cancer Paternal Great-grandmother   . Pancreatic cancer Other        PGMs brother   . Throat cancer Paternal Uncle   . Breast cancer Other        PGMs sister    The patient has two daughters and a son who are all cancer free.  She has two full sisters, one maternal half sister and two paternal half sisters, all who are cancer free.  Both parents are living.  The patient's mother has a full brother and sister, and two maternal half sisters and two maternal half brothers, all who are cancer free.  Her maternal grandfather is living but her grandmother developed breast cancer at 5 and died at 21.  There is no other reported cancer history on the maternal side.  The patient's father has two full brothers and three paternal half brothers all are cancer free.  His father is living and his mother is deceased.  His mother, her sister and her mother all had breat cancer.  His mother's brother had pancreatic cancer.  Ms. Real is unaware of previous family history of genetic testing for hereditary cancer risks. Patient's maternal ancestors are of African American descent, and paternal ancestors are of African American descent. There is no reported Ashkenazi Jewish ancestry. There is no known consanguinity.    GENETIC TEST RESULTS: Genetic testing reported out on May 14, 2019 through the common hereditary cancer panel found no pathogenic mutations. The Common Hereditary Gene Panel  offered by [facility] includes sequencing and/or deletion duplication testing of the following 48 genes: APC, ATM, AXIN2, BARD1, BMPR1A, BRCA1, BRCA2, BRIP1, CDH1, CDK4, CDKN2A (p14ARF), CDKN2A (p16INK4a), CHEK2, CTNNA1, DICER1, EPCAM (Deletion/duplication testing only), GREM1 (promoter region deletion/duplication testing only), KIT, MEN1, MLH1, MSH2, MSH3, MSH6, MUTYH, NBN, NF1, NHTL1, PALB2, PDGFRA, PMS2, POLD1, POLE, PTEN, RAD50, RAD51C, RAD51D, RNF43, SDHB, SDHC, SDHD, SMAD4, SMARCA4. STK11, TP53, TSC1, TSC2, and VHL.  The following genes were evaluated for sequence changes only: SDHA and HOXB13 c.251G>A variant only. The test report has been scanned into EPIC and is located under the Molecular Pathology section of the Results Review tab.  A portion of the result report is included below for reference.      We discussed with Ms. Bottoms that because current genetic testing is not perfect, it is possible there may be a gene mutation in one of these genes that current testing cannot detect, but that chance is small.  We also discussed, that there could be another gene that has not yet been discovered, or that we have not yet tested, that is responsible for the cancer diagnoses in the family. It is also possible  there is a hereditary cause for the cancer in the family that Ms. Beel did not inherit and therefore was not identified in her testing.  Therefore, it is important to remain in touch with cancer genetics in the future so that we can continue to offer Ms. Mccay the most up to date genetic testing.   ADDITIONAL GENETIC TESTING: We discussed with Ms. Drenning that there are other genes that are associated with increased cancer risk that can be analyzed. Should Ms. Radin wish to pursue additional genetic testing, we are happy to discuss and coordinate this testing, at any time.    CANCER SCREENING RECOMMENDATIONS: Ms. Arseneault test result is considered negative (normal).  This means that we  have not identified a hereditary cause for her personal and family history of cancer at this time. Most cancers happen by chance and this negative test suggests that her cancer may fall into this category.    While reassuring, this does not definitively rule out a hereditary predisposition to cancer. It is still possible that there could be genetic mutations that are undetectable by current technology. There could be genetic mutations in genes that have not been tested or identified to increase cancer risk.  Therefore, it is recommended she continue to follow the cancer management and screening guidelines provided by her oncology and primary healthcare provider.   An individual's cancer risk and medical management are not determined by genetic test results alone. Overall cancer risk assessment incorporates additional factors, including personal medical history, family history, and any available genetic information that may result in a personalized plan for cancer prevention and surveillance  RECOMMENDATIONS FOR FAMILY MEMBERS:  Individuals in this family might be at some increased risk of developing cancer, over the general population risk, simply due to the family history of cancer.  We recommended women in this family have a yearly mammogram beginning at age 47, or 58 years younger than the earliest onset of cancer, an annual clinical breast exam, and perform monthly breast self-exams. Women in this family should also have a gynecological exam as recommended by their primary provider. All family members should have a colonoscopy by age 58.  FOLLOW-UP: Lastly, we discussed with Ms. Satterfield that cancer genetics is a rapidly advancing field and it is possible that new genetic tests will be appropriate for her and/or her family members in the future. We encouraged her to remain in contact with cancer genetics on an annual basis so we can update her personal and family histories and let her know of advances in  cancer genetics that may benefit this family.   Our contact number was provided. Ms. Dotter questions were answered to her satisfaction, and she knows she is welcome to call us at anytime with additional questions or concerns.   Roma Kayser, MS, [facility] Certified Genetic Counselor [email or web address]

## 2019-05-16 NOTE — Progress Notes (Signed)
[facility] CSW Progress Notes  Spoke with Wayna Chalet, RN CM, working with patient's middle son.  All 3 children are medically fragile and require significant care.  Children [facility] seen at [facility].  Unclear whether patient has adequate support to deal with children's needs while she is involved in/recovering from cancer treatments. Clarene Critchley Merrill's contact information is/ office [phone] Mobile cell [phone]/email [email or web address].  RN CM will continue to assess patient's supports for her child care needs.  CSW has asked that [employer] is advocating for safe/healthy housing on [address].  Oldest daughter attends [employer]  Younger children are home, cared for by patient/her sister/her fiance.  Multiple stresses on family.  [name], LCSW Clinical Social Worker Phone:  [phone]

## 2019-05-17 ENCOUNTER — Encounter: Payer: Self-pay | Admitting: General Practice

## 2019-05-17 ENCOUNTER — Other Ambulatory Visit: Payer: Medicaid Other

## 2019-05-17 ENCOUNTER — Ambulatory Visit: Payer: Medicaid Other | Admitting: Oncology

## 2019-05-17 ENCOUNTER — Other Ambulatory Visit: Payer: Self-pay | Admitting: Oncology

## 2019-05-17 ENCOUNTER — Telehealth: Payer: Self-pay | Admitting: Oncology

## 2019-05-17 NOTE — Progress Notes (Signed)
[place] Spiritual Care Note  Reached [name] by phone as [place] follow-up. She engaged readily in candid, reflective conversation about parenting, family support, and particularly her personal growth over her last four years of recovery: "I thank God every day for my growth." She notes that she has baseline anxiety, which has been compounded by housing stress; social worker Webb Silversmith Cunningham/LCSW's help in securing housing assistance to enable a move has taken "a huge load off." Per pt, she is working hard at social distancing to protect her three children (two of whom have special respiratory needs) and herself, especially as she goes through chemo. She notes that her grandmother who raised her has COPD and has been diagnosed with covid, so [name] has been separated from one of her biggest supporters.  [name] welcomes further phone calls for Spiritual Care and emotional support. We plan for me to phone again next week.    [place], [place], [facility] Pager [phone] Voicemail [phone]
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
## 2019-05-17 NOTE — Telephone Encounter (Signed)
Called regarding schedule °

## 2019-05-18 ENCOUNTER — Encounter: Payer: Self-pay | Admitting: Oncology

## 2019-05-18 ENCOUNTER — Encounter: Payer: Self-pay | Admitting: *Deleted

## 2019-05-18 ENCOUNTER — Other Ambulatory Visit: Payer: Self-pay | Admitting: Oncology

## 2019-05-22 NOTE — Progress Notes (Signed)
[place]  [phone] [phone]    ID: Terri Estes DOB: 28-Jan-1986  MR#: [id number]  RDE#:[id number]  Patient Care Team: Patient, No Pcp Per as PCP - General (General Practice) Alphonsa Overall, MD as Consulting Physician (General Surgery) Lewanda Perea, [name], MD as Consulting Physician (Oncology) Kyung Rudd, MD as Consulting Physician (Radiation Oncology) Rockwell Germany, RN as Oncology Nurse Navigator Mauro Kaufmann, RN as Oncology Nurse Navigator Donnamae Jude, MD as Consulting Physician (Obstetrics and Gynecology) Pavelock, Ralene Bathe, MD as Consulting Physician (Internal Medicine) OTHER MD:   CHIEF COMPLAINT: Triple negative breast cancer  CURRENT TREATMENT: Neoadjuvant chemotherapy/immunotherapy/goserelin   INTERVAL HISTORY: Terri Estes is here today for follow up and evaluation of her estrogen negative breast cancer.   Terri Estes returns today for her third of 12 planned weekly doses of paclitaxel.  Last week she received Taxol alone.  Today she receives Taxol and pembrolizumab and next week she receives Taxol and carboplatin.  She did very well for the first 2 days then had significant diarrhea for about a day.  She took Imodium as prescribed.  She was able to use Gatorade appropriately.  Nevertheless this was very difficult for her.  She does say the Questran helped.  She will receive her second goserelin dose on June 06, 2019  REVIEW OF SYSTEMS: Terri Estes had other problems including a mild cough, and had a little bit of weight loss but now has gained it back.  She has not had unusual headaches visual changes, or change in bladder habits.  Detailed review of systems today was otherwise stable   HISTORY OF CURRENT ILLNESS: From the original intake note:  [name] presented with swelling along the left side of the face with neck pain. She then underwent a neck CT on 04/11/2019 showing: Enlarged left level 2 lymph nodes with  associated inflammatory change including stranding of the adjacent fat and thickening of the platysma. While malignancy is not excluded, this is most concerning for infection. No focal abscess is present. Hypoattenuation within 1 of the left level 2 lymph nodes likely represents central necrosis or suppuration of the node. No primary malignancy or other abscess. Left upper lobe pulmonary nodules may be inflammatory. The largest measures 0.7 x 0.7 x 0.6 cm. CT of the chest with contrast may be useful for further evaluation.  She also underwent a chest CT on the same day showing: Complex right breast mass measuring 5.6 x 4.5 x 4.7 cm consistent with a primary breast carcinoma. Malignant right axillary adenopathy measuring up to 3.1 cm. Multiple bilateral pulmonary nodules are concerning for metastatic disease. Given the right breast mass, the left cervical lymph nodes are more likely malignant.  She then underwent bilateral diagnostic mammography with tomography and right breast ultrasonography at [facility] on 04/19/2019 showing: Breast Density Category C; findings which are highly suspicious for multicentric inflammatory right breast cancer with right axillary nodal metastatic disease; no mammographic evidence of malignancy involving the left breast.  Accordingly on 04/19/2019 she proceeded to biopsy of the right breast area in question. The pathology from this procedure showed ([id number]): invasive ductal carcinoma, grade III, upper inner quadrant, 12:30 o'clock, 5.0 cm from the nipple. Prognostic indicators significant for: estrogen receptor, 0% negative and progesterone receptor, 0% negative. Proliferation marker Ki67 at 90%. HER2 negative (0+).  Additional biopsies of the right breast and right axilla were performed on the same day. The pathology from this procedure showed ([id number]): 2. Breast, right, needle core biopsy,  satellite mass UOQ, 10 o'clock, 8cmfn  - Invasive ductal carcinoma,  grade III 3. Lymph node, needle/core biopsy, level 1 right axilla   - Invasive ductal carcinoma, grade III  The patient's subsequent history is as detailed below.   PAST MEDICAL HISTORY: Past Medical History:  Diagnosis Date   Anemia    after pregnancy   Anxiety    severe not taking meds   Asthma    inhaler  4x per day   Chronic pain    Family history of breast cancer    History of substance abuse ([place])    Pregnancy induced hypertension    takes medication now.   Psoriasis    Trichomonas infection    Vaginal Pap smear, abnormal      PAST SURGICAL HISTORY: Past Surgical History:  Procedure Laterality Date   CESAREAN SECTION  05/26/2008   CESAREAN SECTION N/A 07/10/2017   Procedure: CESAREAN SECTION;  Surgeon: Florian Buff, MD;  Location: [facility];  Service: Obstetrics;  Laterality: N/A;   CESAREAN SECTION N/A 09/23/2018   Procedure: CESAREAN SECTION;  Surgeon: Donnamae Jude, MD;  Location: [facility];  Service: Obstetrics;  Laterality: N/A;   COLPOSCOPY     PORTACATH PLACEMENT Left 04/30/2019   Procedure: INSERTION PORT-A-CATH WITH ULTRASOUND;  Surgeon: Alphonsa Overall, MD;  Location: [facility];  Service: General;  Laterality: Left;  Left Subclavian vein   WISDOM TOOTH EXTRACTION       FAMILY HISTORY: Family History  Problem Relation Age of Onset   Kidney disease Maternal Grandmother    Breast cancer Maternal Grandmother 28       metastatic to liver; d. 32   Kidney disease Maternal Grandfather    Kidney disease Paternal Grandmother    Diabetes Paternal Grandmother    Breast cancer Paternal Grandmother    Kidney disease Paternal Grandfather    Breast cancer Paternal Aunt    Cerebral palsy Child    Breast cancer Paternal Great-grandmother    Pancreatic cancer Other        PGMs brother    Throat cancer Paternal Uncle    Breast cancer Other        PGMs sister  (Updated 04/25/2019) Chasady's father is living at age 37.  Patients' mother is also living at age 8. [place] notes, however, that she was raised by her grandmother.) The patient has 0 brothers and 5 sisters. Patient denies anyone in her family having ovarian, prostate, or pancreatic cancer. Her maternal grandmother was diagnosed with breast cancer, unsure what age. On her father's side, she reports her grandmother had cysts in her breasts, her great-aunt might have had breast cancer, and her great-grandmother was diagnosed with breast cancer at an older age.   GYNECOLOGIC HISTORY:  No LMP recorded. (Menstrual status: Chemotherapy).  She had an emergent C-section in 09/23/2018 for abruptio placenta at 30 weeks pregnancy, also has a history of eclampsia Menarche:    years old Age at first live birth: 33 years old [place] P: 3 LMP: 04/18/2019 Contraceptive: none HRT: none  Hysterectomy?: no BSO?: no   SOCIAL HISTORY: (Current as of 04/25/2019) Terri Estes is currently unemployed. She previously worked at [employer]. She is currently engaged. Fiance [name] runs a Midwife and works other odd jobs. Daughter Demetrius Charity, age 20, was abused by her father at 24 months and has cerebral palsy. Son [name], age 33, has severe asthma and is developmentally disabled. Daughter Yetta Glassman was born on 09/23/2018 prematurely and remains on  oxygen at home.   ADVANCED DIRECTIVES: not in place.    HEALTH MAINTENANCE: Social History   Tobacco Use   Smoking status: Former Smoker    Packs/day: 0.50    Years: 5.00    Pack years: 2.50    Types: Cigarettes    Last attempt to quit: 04/10/2019    Years since quitting: 0.1   Smokeless tobacco: Never Used   Tobacco comment: trying to quit  Substance Use Topics   Alcohol use: No   Drug use: Yes    Frequency: 7.0 times per week    Types: Marijuana    Comment: subutex    Colonoscopy: never done  PAP: 05/10/2018  Bone density: never done Mammography: first performed following abnormal CT  No Known  Allergies  Current Outpatient Medications  Medication Sig Dispense Refill   albuterol (PROVENTIL HFA;VENTOLIN HFA) 108 (90 Base) MCG/ACT inhaler Inhale 2 puffs into the lungs 4 (four) times daily. 1 Inhaler 2   buprenorphine (SUBUTEX) 2 MG SUBL SL tablet Place 4 mg under the tongue at bedtime.     buprenorphine (SUBUTEX) 8 MG SUBL SL tablet Place 8 mg under the tongue daily.      cholestyramine (QUESTRAN) 4 g packet Take 1 packet (4 g total) by mouth 3 (three) times daily with meals. 60 each 12   cyclobenzaprine (FLEXERIL) 10 MG tablet Take 1 tablet (10 mg total) by mouth 3 (three) times daily as needed for muscle spasms. 90 tablet 2   dexamethasone (DECADRON) 4 MG tablet Take 4 mg by mouth daily. Take 2 tablets by mouth daily on the day after chemotherapy, and then take 2 tablets twice daily for 2 days afterwards.  Take with food.     enalapril (VASOTEC) 10 MG tablet TAKE 1 TABLET BY MOUTH DAILY (Patient taking differently: Take 10 mg by mouth daily. ) 90 tablet 0   ferrous sulfate 325 (65 FE) MG tablet Take 1 tablet (325 mg total) by mouth 3 (three) times daily with meals. 90 tablet 3   gabapentin (NEURONTIN) 300 MG capsule Take 1 capsule (300 mg total) by mouth 3 (three) times daily. 90 capsule 0   omeprazole (PRILOSEC) 40 MG capsule Take 1 capsule (40 mg total) by mouth daily. 30 capsule 5   ondansetron (ZOFRAN) 8 MG tablet Take 1 tablet (8 mg total) by mouth every 8 (eight) hours as needed for nausea or vomiting. 20 tablet 0   PARoxetine (PAXIL) 10 MG tablet Take 1 tablet (10 mg total) by mouth daily. 30 tablet 5   Prenatal MV-Min-FA-Omega-3 (PRENATAL GUMMIES/DHA & FA) 0.4-32.5 MG CHEW Chew 2 each by mouth daily.     No current facility-administered medications for this visit.      OBJECTIVE: Young African-American woman in no acute distress  Vitals:   05/23/19 0810  BP: 121/79  Pulse: 95  Resp: 18  Temp: 98.5 F (36.9 C)  SpO2: 100%     Body mass index is 37.77  kg/m.   Wt Readings from Last 3 Encounters:  05/23/19 193 lb 6.4 oz (87.7 kg)  05/15/19 187 lb 11.2 oz (85.1 kg)  05/08/19 197 lb 12.8 oz (89.7 kg)      ECOG FS:1 - Symptomatic but completely ambulatory  Sclerae unicteric, EOMs intact No cervical or supraclavicular adenopathy Lungs no rales or rhonchi Heart regular rate and rhythm Abd soft, nontender, positive bowel sounds MSK no focal spinal tenderness, no upper extremity lymphedema Neuro: nonfocal, well oriented, appropriate affect Breasts: The right  breast is smaller and softer than before we started treatment.  It is still larger than the left breast.  The skin changes on the breast which are felt to be likely secondary to psoriasis do appear improved  LAB RESULTS:  CMP     Component Value Date/Time   NA 134 (L) 05/15/2019 1300   NA 137 05/10/2018 1535   K 3.7 05/15/2019 1300   CL 99 05/15/2019 1300   CO2 25 05/15/2019 1300   GLUCOSE 126 (H) 05/15/2019 1300   BUN 8 05/15/2019 1300   BUN 7 05/10/2018 1535   CREATININE 1.13 (H) 05/15/2019 1300   CREATININE 0.78 05/11/2019 1305   CALCIUM 9.4 05/15/2019 1300   PROT 8.0 05/15/2019 1300   PROT 7.2 05/10/2018 1535   ALBUMIN 3.8 05/15/2019 1300   ALBUMIN 4.2 05/10/2018 1535   AST 15 05/15/2019 1300   AST 20 05/11/2019 1305   ALT 27 05/15/2019 1300   ALT 44 05/11/2019 1305   ALKPHOS 95 05/15/2019 1300   BILITOT 0.4 05/15/2019 1300   BILITOT 0.4 05/11/2019 1305   GFRNONAA >60 05/15/2019 1300   GFRNONAA >60 05/11/2019 1305   GFRAA >60 05/15/2019 1300   GFRAA >60 05/11/2019 1305    No results found for: TOTALPROTELP, ALBUMINELP, A1GS, A2GS, BETS, BETA2SER, GAMS, MSPIKE, SPEI  No results found for: KPAFRELGTCHN, LAMBDASER, KAPLAMBRATIO  Lab Results  Component Value Date   WBC 5.7 05/23/2019   NEUTROABS 2.7 05/23/2019   HGB 9.9 (L) 05/23/2019   HCT 30.2 (L) 05/23/2019   MCV 92.6 05/23/2019   PLT 207 05/23/2019    _0 @  No results found for:  LABCA2  No components found for: [id number]  No results for input(s): INR in the last 168 hours.  No results found for: LABCA2  No results found for: MAU633  No results found for: HLK562  No results found for: BWL893  No results found for: CA2729  No components found for: HGQUANT  No results found for: CEA1 / No results found for: CEA1   No results found for: AFPTUMOR  No results found for: CHROMOGRNA  No results found for: PSA1  Appointment on 05/23/2019  Component Date Value Ref Range Status   WBC 05/23/2019 5.7  4.0 - 10.5 K/uL Final   RBC 05/23/2019 3.26* 3.87 - 5.11 MIL/uL Final   Hemoglobin 05/23/2019 9.9* 12.0 - 15.0 g/dL Final   HCT 05/23/2019 30.2* 36.0 - 46.0 % Final   MCV 05/23/2019 92.6  80.0 - 100.0 fL Final   MCH 05/23/2019 30.4  26.0 - 34.0 pg Final   MCHC 05/23/2019 32.8  30.0 - 36.0 g/dL Final   RDW 05/23/2019 13.6  11.5 - 15.5 % Final   Platelets 05/23/2019 207  150 - 400 K/uL Final   nRBC 05/23/2019 0.0  0.0 - 0.2 % Final   Neutrophils Relative % 05/23/2019 49  % Final   Neutro Abs 05/23/2019 2.7  1.7 - 7.7 K/uL Final   Lymphocytes Relative 05/23/2019 47  % Final   Lymphs Abs 05/23/2019 2.7  0.7 - 4.0 K/uL Final   Monocytes Relative 05/23/2019 4  % Final   Monocytes Absolute 05/23/2019 0.3  0.1 - 1.0 K/uL Final   Eosinophils Relative 05/23/2019 0  % Final   Eosinophils Absolute 05/23/2019 0.0  0.0 - 0.5 K/uL Final   Basophils Relative 05/23/2019 0  % Final   Basophils Absolute 05/23/2019 0.0  0.0 - 0.1 K/uL Final   Immature Granulocytes 05/23/2019 0  % Final  Abs Immature Granulocytes 05/23/2019 0.01  0.00 - 0.07 K/uL Final   Performed at [facility], [address] [place], [place] [address]    (this displays the last labs from the last 3 days)  No results found for: TOTALPROTELP, ALBUMINELP, A1GS, A2GS, BETS, BETA2SER, GAMS, MSPIKE, SPEI (this displays SPEP labs)  No results found for:  KPAFRELGTCHN, LAMBDASER, KAPLAMBRATIO (kappa/lambda light chains)  Lab Results  Component Value Date   HGBA 97.4 12/16/2016   (Hemoglobinopathy evaluation)   No results found for: LDH  No results found for: IRON, TIBC, IRONPCTSAT (Iron and TIBC)  No results found for: FERRITIN  Urinalysis    Component Value Date/Time   COLORURINE COLORLESS (A) 08/20/2018 0704   APPEARANCEUR CLEAR 08/20/2018 0704   LABSPEC 1.003 (L) 08/20/2018 0704   PHURINE 7.0 08/20/2018 0704   GLUCOSEU NEGATIVE 08/20/2018 0704   HGBUR NEGATIVE 08/20/2018 0704   BILIRUBINUR NEGATIVE 08/20/2018 0704   KETONESUR NEGATIVE 08/20/2018 0704   PROTEINUR NEGATIVE 08/20/2018 0704   UROBILINOGEN 4.0 (H) 01/13/2018 1417   NITRITE NEGATIVE 08/20/2018 0704   LEUKOCYTESUR NEGATIVE 08/20/2018 0704     STUDIES:  Nm Bone Scan Whole Body  Result Date: 05/10/2019 CLINICAL DATA:  Breast cancer EXAM: NUCLEAR MEDICINE WHOLE BODY BONE SCAN TECHNIQUE: Whole body anterior and posterior images were obtained approximately 3 hours after intravenous injection of radiopharmaceutical. RADIOPHARMACEUTICALS:  21.6 mCi Technetium-55mMDP IV COMPARISON:  Abdomen and pelvis CT 05/09/2019.  Chest CT 04/11/2019. FINDINGS: No focal radiotracer accumulation within the bony anatomy to suggest osseous metastatic disease. IMPRESSION: Negative for bony metastatic involvement. Electronically Signed   By: EMisty StanleyM.D.   On: 05/10/2019 09:12   Ct Abdomen Pelvis W Contrast  Result Date: 05/10/2019 CLINICAL DATA:  Right-sided breast cancer EXAM: CT ABDOMEN AND PELVIS WITH CONTRAST TECHNIQUE: Multidetector CT imaging of the abdomen and pelvis was performed using the standard protocol following bolus administration of intravenous contrast. CONTRAST:  1027mOMNIPAQUE IOHEXOL 300 MG/ML  SOLN COMPARISON:  None. FINDINGS: Lower chest: Skin thickening and infiltrative changes noted right breast. Small pulmonary nodules are identified in the lung bases  bilaterally, as noted on recent dedicated chest CT. Some of the pulmonary nodules are new including posterior right lower lobe 4 mm nodule on 11/6 and 4 mm posterior right lower lobe nodule on 07/6. Hepatobiliary: 5 mm low-density lesion lateral segment left liver (18/2) is too small to characterize. There is no evidence for gallstones, gallbladder wall thickening, or pericholecystic fluid. No intrahepatic or extrahepatic biliary dilation. Pancreas: No focal mass lesion. No dilatation of the main duct. No intraparenchymal cyst. No peripancreatic edema. Spleen: No splenomegaly. No focal mass lesion. Adrenals/Urinary Tract: No adrenal nodule or mass. Kidneys unremarkable. No evidence for hydroureter. The urinary bladder appears normal for the degree of distention. Stomach/Bowel: Stomach is unremarkable. No gastric wall thickening. No evidence of outlet obstruction. Duodenum is normally positioned as is the ligament of Treitz. No small bowel wall thickening. No small bowel dilatation. The terminal ileum is normal.The appendix is normal. No gross colonic mass. No colonic wall thickening. Vascular/Lymphatic: No abdominal aortic aneurysm. No abdominal aortic atherosclerotic calcification. There is no gastrohepatic or hepatoduodenal ligament lymphadenopathy. No intraperitoneal or retroperitoneal lymphadenopathy. No pelvic sidewall lymphadenopathy. Reproductive: The uterus is unremarkable.  There is no adnexal mass. Other: No intraperitoneal free fluid. Musculoskeletal: Sclerosis noted at the SI joints bilaterally, noted on x-ray from 09/23/2018. IMPRESSION: 1. Pulmonary nodules in the lung bases concerning for metastatic disease. 2. No  evidence for soft tissue metastatic disease in the abdomen or pelvis. Attention to a tiny low-density lesion in the lateral segment left liver is recommended on follow-up. 3. Sclerosis at the right SI joint, likely degenerative or inflammatory. No appreciable uptake on bone scan today to  suggest metastatic involvement. Electronically Signed   By: Misty Stanley M.D.   On: 05/10/2019 09:56   Mr Breast Bilateral W Wo Contrast Inc Cad  Result Date: 04/30/2019 CLINICAL DATA:  33 year old breast-feeding patient recently diagnosed with grade 3 invasive ductal carcinoma of the right breast following ultrasound-guided biopsy of a dominant necrotic mass in the upper inner quadrant at 12:30 position. She was also diagnosed with grade 3 invasive ductal carcinoma of the right breast, satellite mass, upper outer quadrant 10 o'clock 8 cm from the nipple. Pathology showed invasive ductal carcinoma of a level 1 right axillary lymph node. LABS:  None obtained EXAM: BILATERAL BREAST MRI WITH AND WITHOUT CONTRAST TECHNIQUE: Multiplanar, multisequence MR images of both breasts were obtained prior to and following the intravenous administration of 9 ml of Gadavist Three-dimensional MR images were rendered by post-processing of the original MR data on an independent workstation. The three-dimensional MR images were interpreted, and findings are reported in the following complete MRI report for this study. Three dimensional images were evaluated at the independent DynaCad workstation COMPARISON:  Previous exam(s) April 19, 2019. FINDINGS: Breast composition: b. Scattered fibroglandular tissue. Background parenchymal enhancement: Moderate. Right breast: The right breast is larger and more rounded in contour than the normal-appearing left side, and has prominent vascularity. There is diffuse thickening of the skin of the right breast with patchy areas of suspicious enhancement within the thickened skin. Skin thickness measures at least up to 1.1 cm. There is diffusely increased T2 weighted signal throughout the right breast, the skin of the right breast, and the right pectoralis muscle. A dominant centrally necrotic irregular peripherally enhancing mass that corresponds to the biopsy-proven malignancy in the 12:30  position of the right breast measures 5.2 x 4.4 x 3.8 cm. This contains biopsy clip artifact. A second biopsy clip artifact is noted in the upper-outer right breast, posterior third, corresponding to the position of the coil shaped biopsy clip on the mammogram where a second smaller malignant mass was recently biopsied. There are multiple approximately 1 cm and smaller ring-enhancing masses along the periphery of the dominant mass, and scattered throughout all 4 quadrants of the right breast. Additionally, 2 rim-enhancing masses in the anterior right breast, medial to the nipple, appear to be within the skin, image 68/160 series number 6. In addition to the suspicious ring-enhancing masses seen in all 4 quadrants of the right breast, there are extensive areas of nonmass enhancement involving all 4 quadrants of the right breast. The right pectoralis muscle is thickened, contains abnormal T2 signal and demonstrates focal areas of suspicious enhancement, especially in its medial aspect. Left breast: No mass or abnormal enhancement. Lymph nodes: There some peripherally enhancing abnormal right axillary lymph nodes consistent with biopsy-proven metastatic lymphadenopathy. In the superior most images is a node or conglomerate of nodes with peripheral enhancement that measures 4 x 2 cm in the axial plane. There are additional smaller suspicious lymph nodes in the right axilla and right subpectoral lymphadenopathy is seen on image number 7 of series 3. There are small rim-enhancing nodules along the internal mammary chain on the right, suspicious for internal mammary chain lymphadenopathy. On image number 109/160 of series 6 there is a 1.0 cm probable  lymph node adjacent to the right heart border, with smaller similar appearing nodule inferior to it that may reflect juxtaphrenic/juxtacardiac pathologic lymphadenopathy. Ancillary findings:  None. IMPRESSION: 1. Multifocal biopsy-proven malignancy in the right breast. MRI  findings suggest diffuse malignant involvement throughout all 4 quadrants of the right breast, with associated malignant involvement of the skin suspected, and findings concerning for malignant involvement of the right pectoralis muscle. 2. Right axillary, subpectoral, and internal mammary chain lymphadenopathy. Cannot exclude suspicious lymph nodes in the juxta cardiac/juxta phrenic location. The patient has a scheduled PET-CT. 3. No MRI evidence of malignancy in the left breast. RECOMMENDATION: Continued treatment planning for known right breast carcinoma. BI-RADS CATEGORY  6: Known biopsy-proven malignancy. Electronically Signed   By: Curlene Dolphin M.D.   On: 04/30/2019 16:49   Dg Chest Port 1 View  Result Date: 04/30/2019 CLINICAL DATA:  Porta catheter placement EXAM: PORTABLE CHEST 1 VIEW COMPARISON:  Chest CT April 11, 2019 FINDINGS: The mediastinal contour is normal. The heart size is probably mildly enlarged. The lung volumes are low. A left central venous line is identified with distal tip in the right atrium. There is no pneumothorax. There is enlargement of central pulmonary vessel caliber which may be due to low lung volumes. There is no focal pneumonia or pleural effusion. The visualized skeletal structures are unremarkable. IMPRESSION: Left central venous line distal tip in the right atrium. No pneumothorax. Low lung volumes. Enlargement of central pulmonary vessel caliber bilaterally which may be due to low lung volumes. Electronically Signed   By: Abelardo Diesel M.D.   On: 04/30/2019 14:14   Dg C-arm 1-60 Min-no Report  Result Date: 04/30/2019 Fluoroscopy was utilized by the requesting physician.  No radiographic interpretation.     ELIGIBLE FOR AVAILABLE RESEARCH PROTOCOL: no   ASSESSMENT: 33 y.o. [place] woman status post right breast biopsy x2 and right axillary lymph node biopsy 04/19/2019 for a clinical T4 N2 MX, stage IIIC invasive ductal carcinoma, grade 3, triple negative, with  an MIB-1 of 90%  (a) chest CT scan 04/11/2019 shows multiple subcentimeter lung nodules  (b) PET scan denied by insurance, CT A/P and bone scan due 05/09/2019  (c) baseline breast MRI 04/30/2019 shows involvement of all 4 quadrants in the right breast as well as right axillary subpectoral and internal mammary lymphadenopathy.  Left breast was unremarkable  (1) neoadjuvant chemotherapy will consist of   (a) pembrolizumab started 05/03/2019, repeated every 21 days   (b) paclitaxel weekly x12 with carboplatin every 21 days x 4, started 05/08/2019  (c) cyclophosphamide and doxorubicin in dose dense fashion x4    (2) definitive surgery pending  (3) adjuvant radiation to follow  (4) genetics testing  (a) Negative genetic testing on the common hereditary cancer panel.  The Common Hereditary Gene Panel offered by [facility] includes sequencing and/or deletion duplication testing of the following 48 genes: APC, ATM, AXIN2, BARD1, BMPR1A, BRCA1, BRCA2, BRIP1, CDH1, CDK4, CDKN2A (p14ARF), CDKN2A ([id number]), CHEK2, CTNNA1, DICER1, EPCAM (Deletion/duplication testing only), GREM1 (promoter region deletion/duplication testing only), KIT, MEN1, MLH1, MSH2, MSH3, MSH6, MUTYH, NBN, NF1, NHTL1, PALB2, PDGFRA, PMS2, POLD1, POLE, PTEN, RAD50, RAD51C, RAD51D, RNF43, SDHB, SDHC, SDHD, SMAD4, SMARCA4. STK11, TP53, TSC1, TSC2, and VHL.  The following genes were evaluated for sequence changes only: SDHA and HOXB13 c.251G>A variant only. The report date is May 14, 2019.  (5) goserelin started 05/08/2019   PLAN:  Terri Estes will proceed to the third of 12 planned doses of paclitaxel today.  She will  also receive pembrolizumab.  This will be her second pembrolizumab dose.  She tolerated the first time without any unusual side effects.  I am not sure what the reason is for her diarrhea.  This is an unusual side effect from the treatment she is having.  Of course pembrolizumab can cause a colitis, but that tends to be more of a  continuing issue and she has received no treatment for that despite which the diarrhea resolved after 24 hours.  I do think she will benefit from IV fluids on day 4 and I am setting that up for her  She will see [place] again in 1 week.  At that time she will receive Taxol and a lower dose of carboplatin (AUC of 2)  She knows to call for any other issue that may develop before the next visit. Chauncey Cruel, MD Medical Oncology and Hematology [facility] [address] [place], [place] [address] Tel. [phone]    Fax. [phone]   I, Wilburn Mylar, am acting as scribe for Dr. [name]. Letta Cargile.  I, Lurline Del MD, have reviewed the above documentation for accuracy and completeness, and I agree with the above.

## 2019-05-23 ENCOUNTER — Other Ambulatory Visit: Payer: Medicaid Other

## 2019-05-23 ENCOUNTER — Telehealth: Payer: Self-pay | Admitting: *Deleted

## 2019-05-23 ENCOUNTER — Ambulatory Visit: Payer: Medicaid Other

## 2019-05-23 ENCOUNTER — Inpatient Hospital Stay (HOSPITAL_BASED_OUTPATIENT_CLINIC_OR_DEPARTMENT_OTHER): Payer: Medicaid Other | Admitting: Oncology

## 2019-05-23 ENCOUNTER — Inpatient Hospital Stay: Payer: Medicaid Other

## 2019-05-23 ENCOUNTER — Encounter: Payer: Self-pay | Admitting: General Practice

## 2019-05-23 ENCOUNTER — Encounter (HOSPITAL_COMMUNITY): Payer: Self-pay | Admitting: Surgery

## 2019-05-23 ENCOUNTER — Ambulatory Visit: Payer: Medicaid Other | Admitting: Oncology

## 2019-05-23 ENCOUNTER — Other Ambulatory Visit: Payer: Self-pay

## 2019-05-23 ENCOUNTER — Other Ambulatory Visit: Payer: Self-pay | Admitting: *Deleted

## 2019-05-23 ENCOUNTER — Telehealth: Payer: Self-pay | Admitting: Oncology

## 2019-05-23 ENCOUNTER — Inpatient Hospital Stay: Payer: Medicaid Other | Admitting: General Practice

## 2019-05-23 VITALS — BP 121/79 | HR 95 | Temp 98.5°F | Resp 18 | Ht 60.0 in | Wt 193.4 lb

## 2019-05-23 DIAGNOSIS — Z833 Family history of diabetes mellitus: Secondary | ICD-10-CM

## 2019-05-23 DIAGNOSIS — R918 Other nonspecific abnormal finding of lung field: Secondary | ICD-10-CM

## 2019-05-23 DIAGNOSIS — E860 Dehydration: Secondary | ICD-10-CM

## 2019-05-23 DIAGNOSIS — Z79899 Other long term (current) drug therapy: Secondary | ICD-10-CM

## 2019-05-23 DIAGNOSIS — Z820 Family history of epilepsy and other diseases of the nervous system: Secondary | ICD-10-CM

## 2019-05-23 DIAGNOSIS — C50211 Malignant neoplasm of upper-inner quadrant of right female breast: Secondary | ICD-10-CM

## 2019-05-23 DIAGNOSIS — Z171 Estrogen receptor negative status [ER-]: Secondary | ICD-10-CM | POA: Diagnosis not present

## 2019-05-23 DIAGNOSIS — Z841 Family history of disorders of kidney and ureter: Secondary | ICD-10-CM

## 2019-05-23 DIAGNOSIS — R234 Changes in skin texture: Secondary | ICD-10-CM

## 2019-05-23 DIAGNOSIS — Z7951 Long term (current) use of inhaled steroids: Secondary | ICD-10-CM

## 2019-05-23 DIAGNOSIS — Z72820 Sleep deprivation: Secondary | ICD-10-CM

## 2019-05-23 DIAGNOSIS — R590 Localized enlarged lymph nodes: Secondary | ICD-10-CM | POA: Diagnosis not present

## 2019-05-23 DIAGNOSIS — R197 Diarrhea, unspecified: Secondary | ICD-10-CM

## 2019-05-23 DIAGNOSIS — K219 Gastro-esophageal reflux disease without esophagitis: Secondary | ICD-10-CM

## 2019-05-23 DIAGNOSIS — R609 Edema, unspecified: Secondary | ICD-10-CM

## 2019-05-23 DIAGNOSIS — I2510 Atherosclerotic heart disease of native coronary artery without angina pectoris: Secondary | ICD-10-CM

## 2019-05-23 DIAGNOSIS — C50411 Malignant neoplasm of upper-outer quadrant of right female breast: Secondary | ICD-10-CM | POA: Diagnosis not present

## 2019-05-23 DIAGNOSIS — C50811 Malignant neoplasm of overlapping sites of right female breast: Secondary | ICD-10-CM

## 2019-05-23 DIAGNOSIS — Z5111 Encounter for antineoplastic chemotherapy: Secondary | ICD-10-CM | POA: Diagnosis not present

## 2019-05-23 DIAGNOSIS — L409 Psoriasis, unspecified: Secondary | ICD-10-CM

## 2019-05-23 DIAGNOSIS — M542 Cervicalgia: Secondary | ICD-10-CM

## 2019-05-23 DIAGNOSIS — Z800 Family history of malignant neoplasm of digestive organs: Secondary | ICD-10-CM

## 2019-05-23 DIAGNOSIS — F1220 Cannabis dependence, uncomplicated: Secondary | ICD-10-CM

## 2019-05-23 DIAGNOSIS — Z802 Family history of malignant neoplasm of other respiratory and intrathoracic organs: Secondary | ICD-10-CM

## 2019-05-23 DIAGNOSIS — G8929 Other chronic pain: Secondary | ICD-10-CM

## 2019-05-23 DIAGNOSIS — Z87891 Personal history of nicotine dependence: Secondary | ICD-10-CM

## 2019-05-23 DIAGNOSIS — Z803 Family history of malignant neoplasm of breast: Secondary | ICD-10-CM

## 2019-05-23 LAB — CBC WITH DIFFERENTIAL/PLATELET
Abs Immature Granulocytes: 0.01 10*3/uL (ref 0.00–0.07)
Basophils Absolute: 0 10*3/uL (ref 0.0–0.1)
Basophils Relative: 0 %
Eosinophils Absolute: 0 10*3/uL (ref 0.0–0.5)
Eosinophils Relative: 0 %
HCT: 30.2 % — ABNORMAL LOW (ref 36.0–46.0)
Hemoglobin: 9.9 g/dL — ABNORMAL LOW (ref 12.0–15.0)
Immature Granulocytes: 0 %
Lymphocytes Relative: 47 %
Lymphs Abs: 2.7 10*3/uL (ref 0.7–4.0)
MCH: 30.4 pg (ref 26.0–34.0)
MCHC: 32.8 g/dL (ref 30.0–36.0)
MCV: 92.6 fL (ref 80.0–100.0)
Monocytes Absolute: 0.3 10*3/uL (ref 0.1–1.0)
Monocytes Relative: 4 %
Neutro Abs: 2.7 10*3/uL (ref 1.7–7.7)
Neutrophils Relative %: 49 %
Platelets: 207 10*3/uL (ref 150–400)
RBC: 3.26 MIL/uL — ABNORMAL LOW (ref 3.87–5.11)
RDW: 13.6 % (ref 11.5–15.5)
WBC: 5.7 10*3/uL (ref 4.0–10.5)
nRBC: 0 % (ref 0.0–0.2)

## 2019-05-23 LAB — COMPREHENSIVE METABOLIC PANEL
ALT: 34 U/L (ref 0–44)
AST: 26 U/L (ref 15–41)
Albumin: 3.4 g/dL — ABNORMAL LOW (ref 3.5–5.0)
Alkaline Phosphatase: 81 U/L (ref 38–126)
Anion gap: 10 (ref 5–15)
BUN: 14 mg/dL (ref 6–20)
CO2: 25 mmol/L (ref 22–32)
Calcium: 8.7 mg/dL — ABNORMAL LOW (ref 8.9–10.3)
Chloride: 104 mmol/L (ref 98–111)
Creatinine, Ser: 0.96 mg/dL (ref 0.44–1.00)
GFR calc Af Amer: >60 mL/min (ref >60)
GFR calc non Af Amer: >60 mL/min (ref >60)
Glucose, Bld: 110 mg/dL — ABNORMAL HIGH (ref 70–99)
Potassium: 3.4 mmol/L — ABNORMAL LOW (ref 3.5–5.1)
Sodium: 139 mmol/L (ref 135–145)
Total Bilirubin: <0.2 mg/dL — ABNORMAL LOW (ref 0.3–1.2)
Total Protein: 6.4 g/dL — ABNORMAL LOW (ref 6.5–8.1)

## 2019-05-23 MED ORDER — DIPHENHYDRAMINE HCL 50 MG/ML IJ SOLN
25.0000 mg | Freq: Once | INTRAMUSCULAR | Status: AC
  Administered 2019-05-23: 10:00:00 25 mg via INTRAVENOUS

## 2019-05-23 MED ORDER — SODIUM CHLORIDE 0.9% FLUSH
10.0000 mL | INTRAVENOUS | Status: DC | PRN
  Administered 2019-05-23: 12:00:00 10 mL
  Filled 2019-05-23: qty 10

## 2019-05-23 MED ORDER — FAMOTIDINE IN NACL 20-0.9 MG/50ML-% IV SOLN
INTRAVENOUS | Status: AC
  Filled 2019-05-23: qty 50

## 2019-05-23 MED ORDER — DIPHENHYDRAMINE HCL 50 MG/ML IJ SOLN
INTRAMUSCULAR | Status: AC
  Filled 2019-05-23: qty 1

## 2019-05-23 MED ORDER — FAMOTIDINE IN NACL 20-0.9 MG/50ML-% IV SOLN
20.0000 mg | Freq: Once | INTRAVENOUS | Status: AC
  Administered 2019-05-23: 10:00:00 20 mg via INTRAVENOUS

## 2019-05-23 MED ORDER — SODIUM CHLORIDE 0.9 % IV SOLN
20.0000 mg | Freq: Once | INTRAVENOUS | Status: AC
  Administered 2019-05-23: 10:00:00 20 mg via INTRAVENOUS
  Filled 2019-05-23: qty 20

## 2019-05-23 MED ORDER — SODIUM CHLORIDE 0.9 % IV SOLN
Freq: Once | INTRAVENOUS | Status: AC
  Administered 2019-05-23: 10:00:00 via INTRAVENOUS
  Filled 2019-05-23: qty 250

## 2019-05-23 MED ORDER — SODIUM CHLORIDE 0.9 % IV SOLN
200.0000 mg | Freq: Once | INTRAVENOUS | Status: AC
  Administered 2019-05-23: 10:00:00 200 mg via INTRAVENOUS
  Filled 2019-05-23: qty 8

## 2019-05-23 MED ORDER — SODIUM CHLORIDE 0.9 % IV SOLN
80.0000 mg/m2 | Freq: Once | INTRAVENOUS | Status: AC
  Administered 2019-05-23: 11:00:00 162 mg via INTRAVENOUS
  Filled 2019-05-23: qty 27

## 2019-05-23 MED ORDER — SODIUM CHLORIDE 0.9% FLUSH
10.0000 mL | INTRAVENOUS | Status: DC | PRN
  Administered 2019-05-23: 08:00:00 10 mL
  Filled 2019-05-23: qty 10

## 2019-05-23 MED ORDER — HEPARIN SOD (PORK) LOCK FLUSH 100 UNIT/ML IV SOLN
500.0000 [IU] | Freq: Once | INTRAVENOUS | Status: AC | PRN
  Administered 2019-05-23: 12:00:00 500 [IU]
  Filled 2019-05-23: qty 5

## 2019-05-23 NOTE — Patient Instructions (Signed)
[facility] Discharge Instructions for Patients Receiving Chemotherapy  Today you received the following chemotherapy agents Taxol and keytruda To help prevent nausea and vomiting after your treatment, we encourage you to take your nausea medication.   If you develop nausea and vomiting that is not controlled by your nausea medication, call the clinic.   BELOW ARE SYMPTOMS THAT SHOULD BE REPORTED IMMEDIATELY:  *FEVER GREATER THAN 100.5 F  *CHILLS WITH OR WITHOUT FEVER  NAUSEA AND VOMITING THAT IS NOT CONTROLLED WITH YOUR NAUSEA MEDICATION  *UNUSUAL SHORTNESS OF BREATH  *UNUSUAL BRUISING OR BLEEDING  TENDERNESS IN MOUTH AND THROAT WITH OR WITHOUT PRESENCE OF ULCERS  *URINARY PROBLEMS  *BOWEL PROBLEMS  UNUSUAL RASH Items with * indicate a potential emergency and should be followed up as soon as possible.  Feel free to call the clinic should you have any questions or concerns. The clinic phone number is [phone].  Please show the [place] at check-in to the Emergency Department and triage nurse.

## 2019-05-23 NOTE — Telephone Encounter (Signed)
Contacted infusion to see if the patient will be okay if we change her appt time per [facility] request.  Patient was okay with an earlier time.  Appt time changed and patient aware of the new appt time.

## 2019-05-23 NOTE — Telephone Encounter (Signed)
This RN attended meeting with pt , cancer center SW, Terri Estes, as well as Education officer, museum for [employer] Terri Estes .  Of note pt has 3 children all with known health issues.  Purpose of meeting was to coordinate care of children as well as Terri Estes's own health needs.  Present needs is housing.  Plan is for Terri Estes to locate homes.  Terri Estes needs to call this office- ask for Terri Estes's nurse if she is having symptoms that interfere with care of children. She needs to keep her phone available for return call and discussion.  Terri Estes can be Education officer, museum for [employer] as well as Terri Estes.  Per meeting today - Terri Estes will contact Terri Estes for coordination of care for appointments with Terri Estes and Terri Estes as well as she has permission to help with understanding of care needed.  Terri Estes will also contact the younger children's doctor - Terri Estes- to see if any specialist or referrals can be done in [place] instead of [employer] or other places.  Terri Estes's oldest daughter- Terri Estes- has SW and Product manager at the school who are seeing how they can assist with Terri Estes. SW [facility]  Ph # [phone] Guidance Counselor Terri Estes [phone].  Terri Estes has 6 months of IV therapy ( already completed 1 month ).  After chemo - surgery is planned- ( around Thanksgiving- Christmas time ) Terri Estes needs to see who can assist with the daily care of children for at least 4 weeks after surgery to allow herself time to recover.  Terri Estes will start radiation about a month after surgery. ( around February 2021) Radiation is every day - Monday thru Friday - and she needs to plan on a total of 2 hours of being out of the home for each day.  Terri Estes understands need to plan as best as possible for care of children during this year so she can get the medical care needed to keep herself healthy.  Terri Estes states currently her resources for help with the children are :  Terri Estes ( her fiance and father  of Terri Estes ) ph # [phone] Her cousin, Terri Estes ph #  [phone]. Her current neighbor Terri Estes ph# [phone]  She also has an aunt who is planning to come and help with family - Terri Estes lives in [place], [place] [phone].  Her mother is willing to help some with the children's appointment- her name is Terri Estes ph# [phone].\  Terri Estes feels her father would be best to help with the children when she has her surgery and will be talking with him.  This RN informed pt of FMLA which allows family to provide needed medical care and have work position protected. If family needs forms completed they can bring or send forms to this office:  Terri Estes [place] [address]  Phone [phone] Fax [phone].  Above information will forwarded to nurse navigators and MD for communication with Terri Estes's own medical care as well as care of her 3 children and their needs. Terri Estes, Terri Estes and Terri Estes ).  Of note Terri Estes was able to understand that she has a year of very active therapy for herself, as well as this RN reiterated need to continue care due to areas in lung that are being monitored.  Terri Estes stated " I feel better knowing I have a team " with understanding role Terri Estes can provide as well as her medical team.

## 2019-05-23 NOTE — Progress Notes (Signed)
[facility] CSW Progress Notes  Met via Facetime with patient, Terri Pel RN and Terri Chalet RN CM w [facility] Department 0 - 5 case management to coordinate care for patient as she undergoes cancer treatment.  All three children have special needs, currently cared for in the home by patient and her partner, Terri Estes [phone]).  Terri Estes is the primary breadwinner for family at this time and works as a Civil engineer, contracting.  Terri Estes also has help from various family members including Terri Estes (cousin - [phone]) who stays off/on with the family; her mother Terri Estes (lives in [place], comes every couple of weeks [phone]), her aunt Terri Estes (lives in [place], comes every couple of weeks [phone]), and a neighbor Terri Estes (checks on her often, is available for emergency needs [phone]).  At this point, Terri Estes is taking the lead on the numerous medical appointments for the two younger children as well as working.  Of note, these appointments are both in [place] and with specialists in [facility]  The children are under the care of Terri Tamera Punt at [facility] for primary care needs.    Terri Estes (13) - has cerebral palsy, uses a walter, needs assistance w lifts, has bouts of severe pain, and can be "irritable" as she is becoming an adolescent.  She attends [employer].  Terri Estes (Product manager [phone]) and Terri Estes (social worker [phone]) have been helpful in arranging outside services for Terri Estes; however since [employer] closed classrooms, she has not had in home hands on services.  She has not been referred for CAP services.  Terri Estes (2) has vision and possible neurological issues. The youngest child (approx 1) also has multiple medical needs.  Both are in process of getting appropriate services in place to meet their developmental needs and maximize their potential function.  Terri Estes has been assigned as RN CM for Terri Estes and also hopes to pick up  the baby's case.  She will also work with Terri Tamera Punt to move as many follow up and speciality visits to [facility] providers in hopes that this will lessen the load on the family.  Terri Estes outlined the possible course of treatment for Terri Estes - this includes 6 months of rigorous chemotherapy, surgery, recovery from surgery then 6 weeks of daily radiation treatments.  Terri Estes needs to be able to concentrate on her own medical treatment which, in an ideal world, would be completed on schedule.  Terri Estes is aware that she will need to find appropriate child care for the times she can expect to be convalescing, especially after surgery when she will have lifting restrictions.  Terri Estes was awarded a $1500 voucher from [employer] - valid for 30 days.  This can be used for first months rent and deposit for new housing.  She has significant concerns about her present dwelling which has health/environmental concerns.  [employer] has provided a list of housing options in her price range, Terri Estes will take the lead in locating new housing.  Per Terri Estes, the [employer] team is also helping w locating housing.  RN CM Terri Estes will work w the children's pediatrician to coordinate care and continue to engage Terri Estes as the primary caregiver for the two younger children.  She will work on referrals to appropriate supportive services and link family to community resources to meet the children's needs.  She may need a letter from Terri Estes to ensure that patient can obtain formula for the baby without having t go to the  [facility] office - this would limit patient's potential exposure to Terri Estes or other infection concerns.    The patient's main concern at present is securing safe and affordable housing for herself and her children.  She is aware of the potential impact of cancer treatment on her ability to care for her children and is thinking through ways she can enlist the support of family and friends.   She voiced appreciation for the "team" that is trying to help her and her family through this challenging situation.  Terri Shell, LCSW Clinical Social Worker Phone:  [phone]

## 2019-05-24 ENCOUNTER — Telehealth: Payer: Self-pay | Admitting: *Deleted

## 2019-05-24 ENCOUNTER — Telehealth: Payer: Self-pay | Admitting: Oncology

## 2019-05-24 NOTE — Telephone Encounter (Signed)
Called regarding schedule she asked if nurse could call her asap

## 2019-05-24 NOTE — Telephone Encounter (Addendum)
This RN received note from scheduling to call pt ASAP at 4pm.  Call returned - [name] was very emotional - with some mild crying- stating " I am having that diarrhea again and my stomach hurts and my back "  [name] stated she one episode of diarrhea early am " that I couldn't hold and I messed on myself "  She took 2 imodium and stayed on the toilet for 1 hour with diarrhea " I drank at least 3 Gatorades while sitting there and would have a little diarrhea like about every 5-6 minutes."  She states she had hot flashes with diarrhea.  She is currently having back pain and " I am scared and having anxiety "  This RN reviewed ingredients in the Gatorade to verify she was not drinking inert sugar which can promote diarrhea ( she isn't ) but also informed her best to limit the gatorade to 1 bottle a day- and then to drink other fluids ( water ideally ) for best benefit. [name] informed this RN she also drinks " diet green tea for energy and immune support ", this RN informed her to hold off on these drinks due to possible irritation of bowels.  This RN informed [name] to take another imodium now and per her medication noted she has flexeril - requested her to take one of them for discomfort.  [name] was able to verbalize back to this RN plan for this evening - including use of soups and broths for rehydration and soft foods including toast.  She understands to call this office in the am with update - so if symptoms are not resolved we can have her come in for IVF.  At end of phone discussion- [name] stated she felt less anxiety and better overall knowing what to do and having a plan.  This RN also reviewed prenatal vitamins which have iron and informed her to hold on them presently due to noted stomach discomfort.

## 2019-05-24 NOTE — Telephone Encounter (Signed)
Spoke with patient to follow up to see how she is doing.  Patient stated she just spoke with Dr. [name] nurse about her diarrhea and pain.  She states her hair is falling out and that is makin her a little sad.  Confirmed her understanding of the nurse's instructions.

## 2019-05-25 ENCOUNTER — Encounter: Payer: Self-pay | Admitting: General Practice

## 2019-05-25 MED ORDER — POTASSIUM CHLORIDE IN NACL 20-0.9 MEQ/L-% IV SOLN
Freq: Once | INTRAVENOUS | Status: DC
  Filled 2019-05-25: qty 1000

## 2019-05-25 NOTE — Progress Notes (Unsigned)
Received V.O. from [facility] on 5/27 that patient is to receive 1L NS w/ 20 mEq KCL on Saturday, 5/30. Orders in supportive care plan.   Demetrius Charity, PharmD, [facility] Oncology Pharmacist Pharmacy Phone: [phone] 05/25/2019

## 2019-05-25 NOTE — Progress Notes (Signed)
[facility] Spiritual Care Note  Followed up with Terri Estes by phone for further Spiritual Care. She verbalized gratitude for her healthcare and housing advocacy team, noting that she is "getting better at accepting help" after being used to independence and self-reliance for so long. Overall, she is maintaining a positive attitude and presenting as upbeat, despite chemo side effects and daily life challenges. Perspective and gratitude are helping her cope.  Mailed a handwritten note of encouragement as a follow-up demonstration of care. We plan to check in by phone again next week.   [place], [place], [facility] Pager [phone] Voicemail [phone]

## 2019-05-25 NOTE — Progress Notes (Signed)
[facility] CSW Progress Notes  Update by email from [facility] [place]:  Has spoken w patient by phone. Is coordinating multiple appointments for children in month of June, [name]/fiance is designated to look for alternate housing using resources from [employer].  Additional WIC resources found for baby, RN CM will also work on [employer] for baby.  Will coordinate with school social workers for family needs of oldest child.  "[name] is leaning toward her Dad(he works at [employer] near [place]) [name] to be her power of attorney,  feels safe with him , I will send his number and step mothers to you for the team list."   [name], [facility] Worker Phone:  [phone]

## 2019-05-26 ENCOUNTER — Inpatient Hospital Stay: Payer: Medicaid Other

## 2019-05-29 NOTE — Progress Notes (Signed)
[place]  [phone] [phone]    ID: Terri Estes DOB: 1986/11/22  MR#: [id number]  QBV#:[id number]  Patient Care Team: Patient, No Pcp Per as PCP - General (General Practice) Alphonsa Overall, MD as Consulting Physician (General Surgery) Jaylee Lantry, [name], MD as Consulting Physician (Oncology) Kyung Rudd, MD as Consulting Physician (Radiation Oncology) Rockwell Germany, RN as Oncology Nurse Navigator Mauro Kaufmann, RN as Oncology Nurse Navigator Donnamae Jude, MD as Consulting Physician (Obstetrics and Gynecology) Pavelock, Ralene Bathe, MD as Consulting Physician (Internal Medicine) OTHER MD:   CHIEF COMPLAINT: Triple negative breast cancer  CURRENT TREATMENT: Neoadjuvant chemotherapy/immunotherapy/goserelin   INTERVAL HISTORY: Terri Estes is here today for follow up and treatment of her estrogen negative breast cancer.   She returns today for continued chemotherapy, but we are delaying her treatment until Monday. She reports stomach pain and fatigue. She describes straining to have a bowel movement, but she notes the stools are normal. She also states she can't eat much at one time because it will hurt her stomach.   She will receive her second goserelin dose on June 06, 2019.   REVIEW OF SYSTEMS: Terri Estes is not feeling well today.  She states her breast is looking better, but she notes tenderness around the nipple. She started her period a week ago, but it is very weak and thin and continues.  The patient denies unusual headaches, visual changes, nausea, vomiting, stiff neck, dizziness, or gait imbalance. There has been no cough, phlegm production, or pleurisy, no chest pain or pressure, and no change in bowel or bladder habits. The patient denies fever, rash, bleeding, unexplained fatigue or unexplained weight loss. A detailed review of systems was otherwise entirely negative.   HISTORY OF CURRENT ILLNESS: From the original intake  note:  Terri Estes presented with swelling along the left side of the face with neck pain. She then underwent a neck CT on 04/11/2019 showing: Enlarged left level 2 lymph nodes with associated inflammatory change including stranding of the adjacent fat and thickening of the platysma. While malignancy is not excluded, this is most concerning for infection. No focal abscess is present. Hypoattenuation within 1 of the left level 2 lymph nodes likely represents central necrosis or suppuration of the node. No primary malignancy or other abscess. Left upper lobe pulmonary nodules may be inflammatory. The largest measures 0.7 x 0.7 x 0.6 cm. CT of the chest with contrast may be useful for further evaluation.  She also underwent a chest CT on the same day showing: Complex right breast mass measuring 5.6 x 4.5 x 4.7 cm consistent with a primary breast carcinoma. Malignant right axillary adenopathy measuring up to 3.1 cm. Multiple bilateral pulmonary nodules are concerning for metastatic disease. Given the right breast mass, the left cervical lymph nodes are more likely malignant.  She then underwent bilateral diagnostic mammography with tomography and right breast ultrasonography at [facility] on 04/19/2019 showing: Breast Density Category C; findings which are highly suspicious for multicentric inflammatory right breast cancer with right axillary nodal metastatic disease; no mammographic evidence of malignancy involving the left breast.  Accordingly on 04/19/2019 she proceeded to biopsy of the right breast area in question. The pathology from this procedure showed ([id number]): invasive ductal carcinoma, grade III, upper inner quadrant, 12:30 o'clock, 5.0 cm from the nipple. Prognostic indicators significant for: estrogen receptor, 0% negative and progesterone receptor, 0% negative. Proliferation marker Ki67 at 90%. HER2 negative (0+).  Additional biopsies of the right breast  and right axilla were  performed on the same day. The pathology from this procedure showed ([id number]): 2. Breast, right, needle core biopsy, satellite mass UOQ, 10 o'clock, 8cmfn  - Invasive ductal carcinoma, grade III 3. Lymph node, needle/core biopsy, level 1 right axilla   - Invasive ductal carcinoma, grade III  The patient's subsequent history is as detailed below.   PAST MEDICAL HISTORY: Past Medical History:  Diagnosis Date  . Anemia    after pregnancy  . Anxiety    severe not taking meds  . Asthma    inhaler  4x per day  . Chronic pain   . Family history of breast cancer   . History of substance abuse ([place])   . Pregnancy induced hypertension    takes medication now.  . Psoriasis   . Trichomonas infection   . Vaginal Pap smear, abnormal      PAST SURGICAL HISTORY: Past Surgical History:  Procedure Laterality Date  . CESAREAN SECTION  05/26/2008  . CESAREAN SECTION N/A 07/10/2017   Procedure: CESAREAN SECTION;  Surgeon: Florian Buff, MD;  Location: [facility];  Service: Obstetrics;  Laterality: N/A;  . CESAREAN SECTION N/A 09/23/2018   Procedure: CESAREAN SECTION;  Surgeon: Donnamae Jude, MD;  Location: [facility];  Service: Obstetrics;  Laterality: N/A;  . COLPOSCOPY    . PORTACATH PLACEMENT Left 04/30/2019   Procedure: INSERTION PORT-A-CATH WITH ULTRASOUND;  Surgeon: Alphonsa Overall, MD;  Location: [facility];  Service: General;  Laterality: Left;  Left Subclavian vein  . WISDOM TOOTH EXTRACTION       FAMILY HISTORY: Family History  Problem Relation Age of Onset  . Kidney disease Maternal Grandmother   . Breast cancer Maternal Grandmother 28       metastatic to liver; d. 55  . Kidney disease Maternal Grandfather   . Kidney disease Paternal Grandmother   . Diabetes Paternal Grandmother   . Breast cancer Paternal Grandmother   . Kidney disease Paternal Grandfather   . Breast cancer Paternal Aunt   . Cerebral palsy Child   . Breast cancer Paternal Great-grandmother    . Pancreatic cancer Other        PGMs brother   . Throat cancer Paternal Uncle   . Breast cancer Other        PGMs sister  (Updated 04/25/2019) Terri Estes's father is living at age 24. Patients' mother is also living at age 17. Marland [place] notes, however, that she was raised by her grandmother.) The patient has 0 brothers and 5 sisters. Patient denies anyone in her family having ovarian, prostate, or pancreatic cancer. Her maternal grandmother was diagnosed with breast cancer, unsure what age. On her father's side, she reports her grandmother had cysts in her breasts, her great-aunt might have had breast cancer, and her great-grandmother was diagnosed with breast cancer at an older age.   GYNECOLOGIC HISTORY:  No LMP recorded. (Menstrual status: Chemotherapy).  She had an emergent C-section in 09/23/2018 for abruptio placenta at 30 weeks pregnancy, also has a history of eclampsia Menarche:    years old Age at first live birth: 34 years old [place] P: 3 LMP: 04/18/2019 Contraceptive: none HRT: none  Hysterectomy?: no BSO?: no   SOCIAL HISTORY: (Current as of 04/25/2019) Terri Estes is currently unemployed. She previously worked at [employer]. She is currently engaged. Fiance [name] runs a Midwife and works other odd jobs. Daughter Demetrius Charity, age 44, was abused by her father at 68 months and has cerebral  palsy. Son [name], age 37, has severe asthma and is developmentally disabled. Daughter Yetta Glassman was born on 09/23/2018 prematurely and remains on oxygen at home.   ADVANCED DIRECTIVES: not in place.    HEALTH MAINTENANCE: Social History   Tobacco Use  . Smoking status: Former Smoker    Packs/day: 0.50    Years: 5.00    Pack years: 2.50    Types: Cigarettes    Last attempt to quit: 04/10/2019    Years since quitting: 0.1  . Smokeless tobacco: Never Used  . Tobacco comment: trying to quit  Substance Use Topics  . Alcohol use: No  . Drug use: Yes    Frequency: 7.0 times per week     Types: Marijuana    Comment: subutex    Colonoscopy: never done  PAP: 05/10/2018  Bone density: never done Mammography: first performed following abnormal CT  No Known Allergies  Current Outpatient Medications  Medication Sig Dispense Refill  . albuterol (PROVENTIL HFA;VENTOLIN HFA) 108 (90 Base) MCG/ACT inhaler Inhale 2 puffs into the lungs 4 (four) times daily. 1 Inhaler 2  . buprenorphine (SUBUTEX) 2 MG SUBL SL tablet Place 4 mg under the tongue at bedtime.    . buprenorphine (SUBUTEX) 8 MG SUBL SL tablet Place 8 mg under the tongue daily.     . cholestyramine (QUESTRAN) 4 g packet Take 1 packet (4 g total) by mouth 3 (three) times daily with meals. 60 each 12  . cyclobenzaprine (FLEXERIL) 10 MG tablet Take 1 tablet (10 mg total) by mouth 3 (three) times daily as needed for muscle spasms. 90 tablet 2  . dexamethasone (DECADRON) 4 MG tablet Take 4 mg by mouth daily. Take 2 tablets by mouth daily on the day after chemotherapy, and then take 2 tablets twice daily for 2 days afterwards.  Take with food.    . enalapril (VASOTEC) 10 MG tablet TAKE 1 TABLET BY MOUTH DAILY (Patient taking differently: Take 10 mg by mouth daily. ) 90 tablet 0  . ferrous sulfate 325 (65 FE) MG tablet Take 1 tablet (325 mg total) by mouth 3 (three) times daily with meals. 90 tablet 3  . gabapentin (NEURONTIN) 300 MG capsule Take 1 capsule (300 mg total) by mouth 3 (three) times daily. 90 capsule 0  . omeprazole (PRILOSEC) 40 MG capsule Take 1 capsule (40 mg total) by mouth daily. 30 capsule 5  . ondansetron (ZOFRAN) 8 MG tablet Take 1 tablet (8 mg total) by mouth every 8 (eight) hours as needed for nausea or vomiting. 20 tablet 0  . PARoxetine (PAXIL) 10 MG tablet Take 1 tablet (10 mg total) by mouth daily. 30 tablet 5  . Prenatal MV-Min-FA-Omega-3 (PRENATAL GUMMIES/DHA & FA) 0.4-32.5 MG CHEW Chew 2 each by mouth daily.     No current facility-administered medications for this visit.    Facility-Administered  Medications Ordered in Other Visits  Medication Dose Route Frequency Provider Last Rate Last Dose  . 0.9 % NaCl with KCl 20 mEq/ L  infusion   Intravenous Once Ange Puskas, [name], MD      . 0.9 % NaCl with KCl 20 mEq/ L  infusion   Intravenous Once Travious Vanover, [name], MD 500 mL/hr at 05/30/19 1025    . heparin lock flush 100 unit/mL  500 Units Intracatheter Once PRN Leilany Digeronimo, [name], MD      . sodium chloride flush (NS) 0.9 % injection 10 mL  10 mL Intracatheter PRN Dally Oshel, [name], MD  OBJECTIVE: Young African-American woman evaluated in the treatment area  There were no vitals filed for this visit.   There is no height or weight on file to calculate BMI.   Wt Readings from Last 3 Encounters:  05/23/19 193 lb 6.4 oz (87.7 kg)  05/15/19 187 lb 11.2 oz (85.1 kg)  05/08/19 197 lb 12.8 oz (89.7 kg)   For today's lab work please see the infusion area flowsheet   ECOG FS:1 - Symptomatic but completely ambulatory   LAB RESULTS:  CMP     Component Value Date/Time   NA 140 05/30/2019 0840   NA 137 05/10/2018 1535   K 4.2 05/30/2019 0840   CL 104 05/30/2019 0840   CO2 26 05/30/2019 0840   GLUCOSE 106 (H) 05/30/2019 0840   BUN 13 05/30/2019 0840   BUN 7 05/10/2018 1535   CREATININE 0.91 05/30/2019 0840   CREATININE 0.78 05/11/2019 1305   CALCIUM 8.7 (L) 05/30/2019 0840   PROT 6.3 (L) 05/30/2019 0840   PROT 7.2 05/10/2018 1535   ALBUMIN 3.4 (L) 05/30/2019 0840   ALBUMIN 4.2 05/10/2018 1535   AST 432 (HH) 05/30/2019 0840   AST 20 05/11/2019 1305   ALT 510 (HH) 05/30/2019 0840   ALT 44 05/11/2019 1305   ALKPHOS 104 05/30/2019 0840   BILITOT 0.2 (L) 05/30/2019 0840   BILITOT 0.4 05/11/2019 1305   GFRNONAA >60 05/30/2019 0840   GFRNONAA >60 05/11/2019 1305   GFRAA >60 05/30/2019 0840   GFRAA >60 05/11/2019 1305    No results found for: TOTALPROTELP, ALBUMINELP, A1GS, A2GS, BETS, BETA2SER, GAMS, MSPIKE, SPEI  No results found for: KPAFRELGTCHN, LAMBDASER,  KAPLAMBRATIO  Lab Results  Component Value Date   WBC 3.8 (L) 05/30/2019   NEUTROABS 1.2 (L) 05/30/2019   HGB 9.8 (L) 05/30/2019   HCT 30.9 (L) 05/30/2019   MCV 95.4 05/30/2019   PLT 139 (L) 05/30/2019    '@LASTCHEMISTRY' @  No results found for: LABCA2  No components found for: [id number]  No results for input(s): INR in the last 168 hours.  No results found for: LABCA2  No results found for: ZHY865  No results found for: HQI696  No results found for: EXB284  No results found for: CA2729  No components found for: HGQUANT  No results found for: CEA1 / No results found for: CEA1   No results found for: AFPTUMOR  No results found for: CHROMOGRNA  No results found for: PSA1  Appointment on 05/30/2019  Component Date Value Ref Range Status  . WBC 05/30/2019 3.8* 4.0 - 10.5 K/uL Final  . RBC 05/30/2019 3.24* 3.87 - 5.11 MIL/uL Final  . Hemoglobin 05/30/2019 9.8* 12.0 - 15.0 g/dL Final  . HCT 05/30/2019 30.9* 36.0 - 46.0 % Final  . MCV 05/30/2019 95.4  80.0 - 100.0 fL Final  . MCH 05/30/2019 30.2  26.0 - 34.0 pg Final  . MCHC 05/30/2019 31.7  30.0 - 36.0 g/dL Final  . RDW 05/30/2019 15.0  11.5 - 15.5 % Final  . Platelets 05/30/2019 139* 150 - 400 K/uL Final  . nRBC 05/30/2019 0.0  0.0 - 0.2 % Final  . Neutrophils Relative % 05/30/2019 31  % Final  . Neutro Abs 05/30/2019 1.2* 1.7 - 7.7 K/uL Final  . Lymphocytes Relative 05/30/2019 66  % Final  . Lymphs Abs 05/30/2019 2.5  0.7 - 4.0 K/uL Final  . Monocytes Relative 05/30/2019 3  % Final  . Monocytes Absolute 05/30/2019 0.1  0.1 - 1.0 K/uL Final  .  Eosinophils Relative 05/30/2019 0  % Final  . Eosinophils Absolute 05/30/2019 0.0  0.0 - 0.5 K/uL Final  . Basophils Relative 05/30/2019 0  % Final  . Basophils Absolute 05/30/2019 0.0  0.0 - 0.1 K/uL Final  . Immature Granulocytes 05/30/2019 0  % Final  . Abs Immature Granulocytes 05/30/2019 0.01  0.00 - 0.07 K/uL Final   Performed at [facility], [place] [address], [place], [place] [address]  . Sodium 05/30/2019 140  135 - 145 mmol/L Final  . Potassium 05/30/2019 4.2  3.5 - 5.1 mmol/L Final  . Chloride 05/30/2019 104  98 - 111 mmol/L Final  . CO2 05/30/2019 26  22 - 32 mmol/L Final  . Glucose, Bld 05/30/2019 106* 70 - 99 mg/dL Final  . BUN 05/30/2019 13  6 - 20 mg/dL Final  . Creatinine, Ser 05/30/2019 0.91  0.44 - 1.00 mg/dL Final  . Calcium 05/30/2019 8.7* 8.9 - 10.3 mg/dL Final  . Total Protein 05/30/2019 6.3* 6.5 - 8.1 g/dL Final  . Albumin 05/30/2019 3.4* 3.5 - 5.0 g/dL Final  . AST 05/30/2019 432* 15 - 41 U/L Final   Comment: REPEATED TO VERIFY CRITICAL RESULT CALLED TO, READ BACK BY AND VERIFIED WITH: NICOLE AMMONS RN'@0941'  L GALLOWAY MT    . ALT 05/30/2019 510* 0 - 44 U/L Final   Comment: REPEATED TO VERIFY CRITICAL RESULT CALLED TO, READ BACK BY AND VERIFIED WITH: NICOLE AMMONS RN'@0941'  L GALLOWAY MT    . Alkaline Phosphatase 05/30/2019 104  38 - 126 U/L Final  . Total Bilirubin 05/30/2019 0.2* 0.3 - 1.2 mg/dL Final  . GFR calc non Af Amer 05/30/2019 >60  >60 mL/min Final  . GFR calc Af Amer 05/30/2019 >60  >60 mL/min Final  . Anion gap 05/30/2019 10  5 - 15 Final   Performed at [facility], [place] [address] [place], [place] [address]    (this displays the last labs from the last 3 days)  No results found for: TOTALPROTELP, ALBUMINELP, A1GS, A2GS, BETS, BETA2SER, GAMS, MSPIKE, SPEI (this displays SPEP labs)  No results found for: KPAFRELGTCHN, LAMBDASER, KAPLAMBRATIO (kappa/lambda light chains)  Lab Results  Component Value Date   HGBA 97.4 12/16/2016   (Hemoglobinopathy evaluation)   No results found for: LDH  No results found for: IRON, TIBC, IRONPCTSAT (Iron and TIBC)  No results found for: FERRITIN  Urinalysis    Component Value Date/Time   COLORURINE COLORLESS (A) 08/20/2018 0704   APPEARANCEUR CLEAR 08/20/2018 0704   LABSPEC 1.003 (L) 08/20/2018 0704    PHURINE 7.0 08/20/2018 0704   GLUCOSEU NEGATIVE 08/20/2018 0704   HGBUR NEGATIVE 08/20/2018 0704   BILIRUBINUR NEGATIVE 08/20/2018 0704   KETONESUR NEGATIVE 08/20/2018 0704   PROTEINUR NEGATIVE 08/20/2018 0704   UROBILINOGEN 4.0 (H) 01/13/2018 1417   NITRITE NEGATIVE 08/20/2018 0704   LEUKOCYTESUR NEGATIVE 08/20/2018 0704     STUDIES:  Nm Bone Scan Whole Body  Result Date: 05/10/2019 CLINICAL DATA:  Breast cancer EXAM: NUCLEAR MEDICINE WHOLE BODY BONE SCAN TECHNIQUE: Whole body anterior and posterior images were obtained approximately 3 hours after intravenous injection of radiopharmaceutical. RADIOPHARMACEUTICALS:  21.6 mCi Technetium-22mMDP IV COMPARISON:  Abdomen and pelvis CT 05/09/2019.  Chest CT 04/11/2019. FINDINGS: No focal radiotracer accumulation within the bony anatomy to suggest osseous metastatic disease. IMPRESSION: Negative for bony metastatic involvement. Electronically Signed   By: EMisty StanleyM.D.   On: 05/10/2019 09:12   Ct Abdomen Pelvis W Contrast  Result Date: 05/10/2019 CLINICAL DATA:  Right-sided breast cancer EXAM: CT ABDOMEN AND PELVIS WITH CONTRAST TECHNIQUE: Multidetector CT imaging of the abdomen and pelvis was performed using the standard protocol following bolus administration of intravenous contrast. CONTRAST:  174m OMNIPAQUE IOHEXOL 300 MG/ML  SOLN COMPARISON:  None. FINDINGS: Lower chest: Skin thickening and infiltrative changes noted right breast. Small pulmonary nodules are identified in the lung bases bilaterally, as noted on recent dedicated chest CT. Some of the pulmonary nodules are new including posterior right lower lobe 4 mm nodule on 11/6 and 4 mm posterior right lower lobe nodule on 07/6. Hepatobiliary: 5 mm low-density lesion lateral segment left liver (18/2) is too small to characterize. There is no evidence for gallstones, gallbladder wall thickening, or pericholecystic fluid. No intrahepatic or extrahepatic biliary dilation. Pancreas: No focal  mass lesion. No dilatation of the main duct. No intraparenchymal cyst. No peripancreatic edema. Spleen: No splenomegaly. No focal mass lesion. Adrenals/Urinary Tract: No adrenal nodule or mass. Kidneys unremarkable. No evidence for hydroureter. The urinary bladder appears normal for the degree of distention. Stomach/Bowel: Stomach is unremarkable. No gastric wall thickening. No evidence of outlet obstruction. Duodenum is normally positioned as is the ligament of Treitz. No small bowel wall thickening. No small bowel dilatation. The terminal ileum is normal.The appendix is normal. No gross colonic mass. No colonic wall thickening. Vascular/Lymphatic: No abdominal aortic aneurysm. No abdominal aortic atherosclerotic calcification. There is no gastrohepatic or hepatoduodenal ligament lymphadenopathy. No intraperitoneal or retroperitoneal lymphadenopathy. No pelvic sidewall lymphadenopathy. Reproductive: The uterus is unremarkable.  There is no adnexal mass. Other: No intraperitoneal free fluid. Musculoskeletal: Sclerosis noted at the SI joints bilaterally, noted on x-ray from 09/23/2018. IMPRESSION: 1. Pulmonary nodules in the lung bases concerning for metastatic disease. 2. No evidence for soft tissue metastatic disease in the abdomen or pelvis. Attention to a tiny low-density lesion in the lateral segment left liver is recommended on follow-up. 3. Sclerosis at the right SI joint, likely degenerative or inflammatory. No appreciable uptake on bone scan today to suggest metastatic involvement. Electronically Signed   By: EMisty StanleyM.D.   On: 05/10/2019 09:56   Dg Chest Port 1 View  Result Date: 04/30/2019 CLINICAL DATA:  Porta catheter placement EXAM: PORTABLE CHEST 1 VIEW COMPARISON:  Chest CT April 11, 2019 FINDINGS: The mediastinal contour is normal. The heart size is probably mildly enlarged. The lung volumes are low. A left central venous line is identified with distal tip in the right atrium. There is no  pneumothorax. There is enlargement of central pulmonary vessel caliber which may be due to low lung volumes. There is no focal pneumonia or pleural effusion. The visualized skeletal structures are unremarkable. IMPRESSION: Left central venous line distal tip in the right atrium. No pneumothorax. Low lung volumes. Enlargement of central pulmonary vessel caliber bilaterally which may be due to low lung volumes. Electronically Signed   By: WAbelardo DieselM.D.   On: 04/30/2019 14:14   Dg C-arm 1-60 Min-no Report  Result Date: 04/30/2019 Fluoroscopy was utilized by the requesting physician.  No radiographic interpretation.     ELIGIBLE FOR AVAILABLE RESEARCH PROTOCOL: no   ASSESSMENT: 33y.o. [place] woman status post right breast biopsy x2 and right axillary lymph node biopsy 04/19/2019 for a clinical T4 N2 MX, stage IIIC invasive ductal carcinoma, grade 3, triple negative, with an MIB-1 of 90%  (a) chest CT scan 04/11/2019 shows multiple subcentimeter lung nodules  (b) PET scan denied by insurance, CT A/P and  bone scan due 05/09/2019  (c) baseline breast MRI 04/30/2019 shows involvement of all 4 quadrants in the right breast as well as right axillary subpectoral and internal mammary lymphadenopathy.  Left breast was unremarkable  (1) neoadjuvant chemotherapy will consist of   (a) pembrolizumab started 05/03/2019, repeated every 21 days   (b) paclitaxel weekly x12 with carboplatin every 21 days x 4, started 05/08/2019   (i) tolerated carboplatin AUC 5-day 1 poorly, switched to AUC 2 on day 1 day 8   (ii) cycle 2 delayed 1 week because of elevated liver function tests  (c) cyclophosphamide and doxorubicin in dose dense fashion x4 to follow   (2) definitive surgery pending  (3) adjuvant radiation to follow  (4) genetics testing  (a) Negative genetic testing on the common hereditary cancer panel.  The Common Hereditary Gene Panel offered by [facility] includes sequencing and/or deletion duplication  testing of the following 48 genes: APC, ATM, AXIN2, BARD1, BMPR1A, BRCA1, BRCA2, BRIP1, CDH1, CDK4, CDKN2A (p14ARF), CDKN2A (p16INK4a), CHEK2, CTNNA1, DICER1, EPCAM (Deletion/duplication testing only), GREM1 (promoter region deletion/duplication testing only), KIT, MEN1, MLH1, MSH2, MSH3, MSH6, MUTYH, NBN, NF1, NHTL1, PALB2, PDGFRA, PMS2, POLD1, POLE, PTEN, RAD50, RAD51C, RAD51D, RNF43, SDHB, SDHC, SDHD, SMAD4, SMARCA4. STK11, TP53, TSC1, TSC2, and VHL.  The following genes were evaluated for sequence changes only: SDHA and HOXB13 c.251G>A variant only. The report date is May 14, 2019.  (5) goserelin started 05/08/2019   PLAN:  Terri Estes has some abdominal discomfort today, mostly pelvic, and elevated liver function tests.  We are not going to be able to give her chemo.  She is already scheduled for the same chemo on 06/05/2019 and that will be the new day 1.  Of course we will repeat the liver function tests at that time.  Note that a CT of the abdomen on 05/09/2019 showed no evidence of metastases to the liver  She will receive her second goserelin dose on 06/05/2019.  She understands after that she likely will not have periods so long as she continues on the goserelin.  She received her second Keytruda dose on 05/23/2019.  I think this may be what is causing her transaminitis.  We will likely hold the third dose or reduce the dose depending on how she does on the next carboplatin and paclitaxel treatments.  The good news is that her right breast is softer and a bit smaller.  She knows to call for any other issue that may develop before the next visit here.   Chauncey Cruel, MD Medical Oncology and Hematology [facility] [address] [place], [place] [address] Tel. [phone]    Fax. [phone]   I, Wilburn Mylar, am acting as scribe for Dr. [name]. Norbert Malkin.  I, Lurline Del MD, have reviewed the above documentation for accuracy and completeness, and I  agree with the above.

## 2019-05-30 ENCOUNTER — Inpatient Hospital Stay: Payer: Medicaid Other

## 2019-05-30 ENCOUNTER — Inpatient Hospital Stay: Payer: Medicaid Other | Attending: Oncology

## 2019-05-30 ENCOUNTER — Inpatient Hospital Stay (HOSPITAL_BASED_OUTPATIENT_CLINIC_OR_DEPARTMENT_OTHER): Payer: Medicaid Other | Admitting: Oncology

## 2019-05-30 ENCOUNTER — Ambulatory Visit: Payer: Medicaid Other

## 2019-05-30 ENCOUNTER — Other Ambulatory Visit: Payer: Medicaid Other

## 2019-05-30 ENCOUNTER — Other Ambulatory Visit: Payer: Self-pay

## 2019-05-30 VITALS — BP 114/72 | HR 86 | Temp 98.7°F | Resp 18

## 2019-05-30 DIAGNOSIS — C50411 Malignant neoplasm of upper-outer quadrant of right female breast: Secondary | ICD-10-CM | POA: Insufficient documentation

## 2019-05-30 DIAGNOSIS — Z171 Estrogen receptor negative status [ER-]: Secondary | ICD-10-CM | POA: Insufficient documentation

## 2019-05-30 DIAGNOSIS — C50811 Malignant neoplasm of overlapping sites of right female breast: Secondary | ICD-10-CM

## 2019-05-30 DIAGNOSIS — D649 Anemia, unspecified: Secondary | ICD-10-CM | POA: Insufficient documentation

## 2019-05-30 DIAGNOSIS — Z5111 Encounter for antineoplastic chemotherapy: Secondary | ICD-10-CM | POA: Diagnosis present

## 2019-05-30 DIAGNOSIS — N369 Urethral disorder, unspecified: Secondary | ICD-10-CM | POA: Insufficient documentation

## 2019-05-30 DIAGNOSIS — K1379 Other lesions of oral mucosa: Secondary | ICD-10-CM | POA: Insufficient documentation

## 2019-05-30 DIAGNOSIS — C50211 Malignant neoplasm of upper-inner quadrant of right female breast: Secondary | ICD-10-CM | POA: Diagnosis present

## 2019-05-30 DIAGNOSIS — C773 Secondary and unspecified malignant neoplasm of axilla and upper limb lymph nodes: Secondary | ICD-10-CM

## 2019-05-30 DIAGNOSIS — R740 Nonspecific elevation of levels of transaminase and lactic acid dehydrogenase [LDH]: Secondary | ICD-10-CM | POA: Diagnosis not present

## 2019-05-30 DIAGNOSIS — R7989 Other specified abnormal findings of blood chemistry: Secondary | ICD-10-CM

## 2019-05-30 DIAGNOSIS — N390 Urinary tract infection, site not specified: Secondary | ICD-10-CM | POA: Insufficient documentation

## 2019-05-30 LAB — CBC WITH DIFFERENTIAL/PLATELET
Abs Immature Granulocytes: 0.01 10*3/uL (ref 0.00–0.07)
Basophils Absolute: 0 10*3/uL (ref 0.0–0.1)
Basophils Relative: 0 %
Eosinophils Absolute: 0 10*3/uL (ref 0.0–0.5)
Eosinophils Relative: 0 %
HCT: 30.9 % — ABNORMAL LOW (ref 36.0–46.0)
Hemoglobin: 9.8 g/dL — ABNORMAL LOW (ref 12.0–15.0)
Immature Granulocytes: 0 %
Lymphocytes Relative: 66 %
Lymphs Abs: 2.5 10*3/uL (ref 0.7–4.0)
MCH: 30.2 pg (ref 26.0–34.0)
MCHC: 31.7 g/dL (ref 30.0–36.0)
MCV: 95.4 fL (ref 80.0–100.0)
Monocytes Absolute: 0.1 10*3/uL (ref 0.1–1.0)
Monocytes Relative: 3 %
Neutro Abs: 1.2 10*3/uL — ABNORMAL LOW (ref 1.7–7.7)
Neutrophils Relative %: 31 %
Platelets: 139 10*3/uL — ABNORMAL LOW (ref 150–400)
RBC: 3.24 MIL/uL — ABNORMAL LOW (ref 3.87–5.11)
RDW: 15 % (ref 11.5–15.5)
WBC: 3.8 10*3/uL — ABNORMAL LOW (ref 4.0–10.5)
nRBC: 0 % (ref 0.0–0.2)

## 2019-05-30 LAB — COMPREHENSIVE METABOLIC PANEL
ALT: 510 U/L (ref 0–44)
AST: 432 U/L (ref 15–41)
Albumin: 3.4 g/dL — ABNORMAL LOW (ref 3.5–5.0)
Alkaline Phosphatase: 104 U/L (ref 38–126)
Anion gap: 10 (ref 5–15)
BUN: 13 mg/dL (ref 6–20)
CO2: 26 mmol/L (ref 22–32)
Calcium: 8.7 mg/dL — ABNORMAL LOW (ref 8.9–10.3)
Chloride: 104 mmol/L (ref 98–111)
Creatinine, Ser: 0.91 mg/dL (ref 0.44–1.00)
GFR calc Af Amer: >60 mL/min (ref >60)
GFR calc non Af Amer: >60 mL/min (ref >60)
Glucose, Bld: 106 mg/dL — ABNORMAL HIGH (ref 70–99)
Potassium: 4.2 mmol/L (ref 3.5–5.1)
Sodium: 140 mmol/L (ref 135–145)
Total Bilirubin: 0.2 mg/dL — ABNORMAL LOW (ref 0.3–1.2)
Total Protein: 6.3 g/dL — ABNORMAL LOW (ref 6.5–8.1)

## 2019-05-30 MED ORDER — SODIUM CHLORIDE 0.9% FLUSH
10.0000 mL | INTRAVENOUS | Status: DC | PRN
  Administered 2019-05-30: 10 mL
  Filled 2019-05-30: qty 10

## 2019-05-30 MED ORDER — HEPARIN SOD (PORK) LOCK FLUSH 100 UNIT/ML IV SOLN
500.0000 [IU] | Freq: Once | INTRAVENOUS | Status: AC | PRN
  Administered 2019-05-30: 500 [IU]
  Filled 2019-05-30: qty 5

## 2019-05-30 MED ORDER — POTASSIUM CHLORIDE IN NACL 20-0.9 MEQ/L-% IV SOLN
Freq: Once | INTRAVENOUS | Status: AC
  Administered 2019-05-30: 10:00:00 via INTRAVENOUS
  Filled 2019-05-30: qty 1000

## 2019-05-30 NOTE — Patient Instructions (Signed)
[facility] Discharge Instructions for Patients Receiving Chemotherapy  Today you received the IV fluids  To help prevent nausea and vomiting after your treatment, we encourage you to take your nausea medication as directed.  If you develop nausea and vomiting that is not controlled by your nausea medication, call the clinic.   BELOW ARE SYMPTOMS THAT SHOULD BE REPORTED IMMEDIATELY:  *FEVER GREATER THAN 100.5 F  *CHILLS WITH OR WITHOUT FEVER  NAUSEA AND VOMITING THAT IS NOT CONTROLLED WITH YOUR NAUSEA MEDICATION  *UNUSUAL SHORTNESS OF BREATH  *UNUSUAL BRUISING OR BLEEDING  TENDERNESS IN MOUTH AND THROAT WITH OR WITHOUT PRESENCE OF ULCERS  *URINARY PROBLEMS  *BOWEL PROBLEMS  UNUSUAL RASH Items with * indicate a potential emergency and should be followed up as soon as possible.  Feel free to call the clinic should you have any questions or concerns. The clinic phone number is [phone].  Please show the [name] at check-in to the Emergency Department and triage nurse.

## 2019-05-30 NOTE — Progress Notes (Signed)
Per DR. Magrinat patient to receive 1L NS with 20 KCL over 2 hours today. No chemotherapy.

## 2019-06-01 ENCOUNTER — Other Ambulatory Visit: Payer: Self-pay | Admitting: *Deleted

## 2019-06-01 ENCOUNTER — Telehealth: Payer: Self-pay | Admitting: *Deleted

## 2019-06-01 ENCOUNTER — Encounter: Payer: Self-pay | Admitting: General Practice

## 2019-06-01 NOTE — Progress Notes (Signed)
[place] CSW Progress Notes  Talked with patient about housing.  States she has looked at two places with [name], is also working with cousin/family member to locate suitable housing.  Patient wants a 3 bedroom place, "big enough for my family so I dont have to move again", [name]   Says that "I don't want to move somewhere that doesn't work even if we do have the voucher, I don't want to move again."  [name] is encouraging her to move quickly so as not to lose help from voucher.  CSW will provide name of property management agency recommended by [employer] [facility] Jeffie Pollock [place] [phone]).  Call concluded because patient was getting call from desk RN to discuss her concerns about current symptoms and how to deal with them.  [name], LCSW Clinical Social Worker Phone:  [phone]

## 2019-06-01 NOTE — Telephone Encounter (Signed)
email to nurse's cone email received from [place] with [facility] :    Quick update on Team [name] .  Stopped by to check on her , and dropped diapers and wipes for the little ones . She has a " sister friend " who works @[employer] and is going to help in ordering the formula for the little one to get it more efficiently with [facility] card . [employer] is rationing the cans of formula and mom can't be stressing to find It and be excessively traveling .     She was unhappy to not get chemo this week , She is experiencing a lot of Scalp Sensitivity and irritation , and leg sensations and pains . Assured me she is doing her vitamins and fluid intake , but is causing her some problems and she reports forgetting to ask when she is @the  cancer Center [ her forgetfulness is bothering her and causing her to misplace things like keys and tools for Zeanna's oxygen tanks ].  Also  2 family members "Belinda " and the Aunt in [place] , which we talked about @ [facility] . have fallen to the "unhelpful side " and so [facility] is on her own until her baby sister comes next weekend .     [name] is doing work when he can , and helping her in between with appointments "that he has a comfort with "and that [name] is comfortable with him doing . He was to do the neurosurgery appointment for [facility] , but mom ended up driving over to [facility] for it instead . There is concern for needing to return this month for a MRI [which they want to try un sedated , which is a tricky risk as if she is unable to rest so they can do pics of her brain , then she will have to go back for a undersedation ] .     Next week there is a conflict with appointments for [facility] , and Mom would have better comfort with her and Rolly Salter sharing the visits , so they can get completed versus him going alone [ she doesn't want to miss anything ].     House /apartment hunt continues ., praying hard for anything close to  their wish list .     Her biological father and step mother are still planning to be her support for the Christmas time surgery projected . However , no power of attny signed yet . 05-04-2002 keeps thinking her dad is feeling ,it would be an admission of how bad her cancer is if he was to sign it , so he is stalling a bit . she is going to press him again in a few days . I would like to offer accompanying [name] to one of the children's appointments , COVID restrictions in the offices vary and don't want to push the issue . She does seem to like the [facility] appointments and so not sure if that may be a route to look at .     Any thoughts,     Thanks , Nurse [facility], cc4c cmarc  [facility]     [phone]  This note will be forwarded to MD and nurse navigators for review and any further recommendations

## 2019-06-01 NOTE — Telephone Encounter (Signed)
This RN spoke with pt per her prior phone discussion with our SW with concern for physical discomforts.  Terri Estes states she is having " like numbness and my skin and body is tender " .  She states the tenderness is most noticed in her arms , hands and shin areas.  She feels like she does not have good blood circulation in her hands .  She states her son " jumped on my back playing and my back like stung for several hours "  She has episodes where " I just burst out crying and feeling like something bad is going to happen "  She states she goes between having very frequent soft stools earlier this week to being constipated until yesterday.  She states the rectal and labial skin is " tender ".  She states her hair has " all fallen out and my scalp hurts and is tender "  She denies any rashes in genital area as well as she states denies history of genital herpes.  She is not having fevers.  Per phone discussion this RN verified pt is taking paxil 10 mg daily - with Terri Estes stating she " really hasn't noticed any benefit "  She has been outside - states she is trying " to stay in the shade but I do like to sit outside "  This RN offered for pt to come in to our [facility] for evaluation - with Terri Estes stating " no- not if I do not have to and you can tell me what to do over the phone "  This RN discussed above post review with MD with pt- including likely having side effects from the [facility] which can have an immune response which should lessen.  Informed [name] MD recommendation to increase the Paxil 10 mg to 2 tablets ( 20 mg ) daily.  She can use a mixture of coconut oil with benadryl cream to scalp and genital area for comfort as well as use of loose clothing to decrease irritation.  Terri Estes verbalized understanding and a " feeling better since we talked and you are telling me this isn't life threatening "  This RN validated Terri Estes's verbalization as well as understanding that  part of our care to her can be phone discussions which help her feel cared for.  Terri Estes stated appreciation of call - with no further needs at this time.

## 2019-06-01 NOTE — Progress Notes (Signed)
[facility] CSW Progress Notes  Spoke w patient about housing - she is working hard on finding appropriate housing in order to be able to utilize voucher provided by [employer].  Has looked at several places already.  Fiance and family are helping in the search.  Wants to apply for disability, will refer to [facility] for help with filing.  Continues to work w Therapist, sports CM from [facility] for support for children's needs.  [name], LCSW Clinical Social Worker Phone:  [phone]

## 2019-06-05 ENCOUNTER — Other Ambulatory Visit: Payer: Self-pay

## 2019-06-05 ENCOUNTER — Encounter: Payer: Self-pay | Admitting: Adult Health

## 2019-06-05 ENCOUNTER — Inpatient Hospital Stay: Payer: Medicaid Other

## 2019-06-05 ENCOUNTER — Telehealth: Payer: Self-pay | Admitting: *Deleted

## 2019-06-05 ENCOUNTER — Inpatient Hospital Stay (HOSPITAL_BASED_OUTPATIENT_CLINIC_OR_DEPARTMENT_OTHER): Payer: Medicaid Other | Admitting: Adult Health

## 2019-06-05 VITALS — BP 111/84 | HR 91 | Temp 98.5°F | Resp 18 | Ht 60.0 in | Wt 194.6 lb

## 2019-06-05 DIAGNOSIS — Z171 Estrogen receptor negative status [ER-]: Secondary | ICD-10-CM

## 2019-06-05 DIAGNOSIS — C773 Secondary and unspecified malignant neoplasm of axilla and upper limb lymph nodes: Secondary | ICD-10-CM

## 2019-06-05 DIAGNOSIS — C50811 Malignant neoplasm of overlapping sites of right female breast: Secondary | ICD-10-CM

## 2019-06-05 DIAGNOSIS — Z5111 Encounter for antineoplastic chemotherapy: Secondary | ICD-10-CM | POA: Diagnosis not present

## 2019-06-05 DIAGNOSIS — R740 Nonspecific elevation of levels of transaminase and lactic acid dehydrogenase [LDH]: Secondary | ICD-10-CM | POA: Diagnosis not present

## 2019-06-05 LAB — CBC WITH DIFFERENTIAL/PLATELET
Abs Immature Granulocytes: 0.01 10*3/uL (ref 0.00–0.07)
Basophils Absolute: 0 10*3/uL (ref 0.0–0.1)
Basophils Relative: 0 %
Eosinophils Absolute: 0 10*3/uL (ref 0.0–0.5)
Eosinophils Relative: 0 %
HCT: 33.6 % — ABNORMAL LOW (ref 36.0–46.0)
Hemoglobin: 10.9 g/dL — ABNORMAL LOW (ref 12.0–15.0)
Immature Granulocytes: 0 %
Lymphocytes Relative: 40 %
Lymphs Abs: 1.7 10*3/uL (ref 0.7–4.0)
MCH: 30.9 pg (ref 26.0–34.0)
MCHC: 32.4 g/dL (ref 30.0–36.0)
MCV: 95.2 fL (ref 80.0–100.0)
Monocytes Absolute: 0.4 10*3/uL (ref 0.1–1.0)
Monocytes Relative: 9 %
Neutro Abs: 2.1 10*3/uL (ref 1.7–7.7)
Neutrophils Relative %: 51 %
Platelets: 298 10*3/uL (ref 150–400)
RBC: 3.53 MIL/uL — ABNORMAL LOW (ref 3.87–5.11)
RDW: 16.1 % — ABNORMAL HIGH (ref 11.5–15.5)
WBC: 4.2 10*3/uL (ref 4.0–10.5)
nRBC: 0 % (ref 0.0–0.2)

## 2019-06-05 LAB — COMPREHENSIVE METABOLIC PANEL
ALT: 145 U/L — ABNORMAL HIGH (ref 0–44)
AST: 39 U/L (ref 15–41)
Albumin: 3.7 g/dL (ref 3.5–5.0)
Alkaline Phosphatase: 115 U/L (ref 38–126)
Anion gap: 10 (ref 5–15)
BUN: 13 mg/dL (ref 6–20)
CO2: 24 mmol/L (ref 22–32)
Calcium: 9.2 mg/dL (ref 8.9–10.3)
Chloride: 106 mmol/L (ref 98–111)
Creatinine, Ser: 0.89 mg/dL (ref 0.44–1.00)
GFR calc Af Amer: >60 mL/min (ref >60)
GFR calc non Af Amer: >60 mL/min (ref >60)
Glucose, Bld: 85 mg/dL (ref 70–99)
Potassium: 4.3 mmol/L (ref 3.5–5.1)
Sodium: 140 mmol/L (ref 135–145)
Total Bilirubin: 0.2 mg/dL — ABNORMAL LOW (ref 0.3–1.2)
Total Protein: 7.4 g/dL (ref 6.5–8.1)

## 2019-06-05 MED ORDER — FAMOTIDINE IN NACL 20-0.9 MG/50ML-% IV SOLN
INTRAVENOUS | Status: AC
  Filled 2019-06-05: qty 50

## 2019-06-05 MED ORDER — GOSERELIN ACETATE 3.6 MG ~~LOC~~ IMPL
3.6000 mg | DRUG_IMPLANT | Freq: Once | SUBCUTANEOUS | Status: AC
  Administered 2019-06-05: 14:00:00 3.6 mg via SUBCUTANEOUS

## 2019-06-05 MED ORDER — SODIUM CHLORIDE 0.9% FLUSH
10.0000 mL | INTRAVENOUS | Status: DC | PRN
  Administered 2019-06-05: 18:00:00 10 mL
  Filled 2019-06-05: qty 10

## 2019-06-05 MED ORDER — SODIUM CHLORIDE 0.9% FLUSH
10.0000 mL | INTRAVENOUS | Status: DC | PRN
  Administered 2019-06-05: 10 mL
  Filled 2019-06-05: qty 10

## 2019-06-05 MED ORDER — HEPARIN SOD (PORK) LOCK FLUSH 100 UNIT/ML IV SOLN
500.0000 [IU] | Freq: Once | INTRAVENOUS | Status: AC | PRN
  Administered 2019-06-05: 18:00:00 500 [IU]
  Filled 2019-06-05: qty 5

## 2019-06-05 MED ORDER — DIPHENHYDRAMINE HCL 50 MG/ML IJ SOLN
INTRAMUSCULAR | Status: AC
  Filled 2019-06-05: qty 1

## 2019-06-05 MED ORDER — SODIUM CHLORIDE 0.9 % IV SOLN
80.0000 mg/m2 | Freq: Once | INTRAVENOUS | Status: AC
  Administered 2019-06-05: 162 mg via INTRAVENOUS
  Filled 2019-06-05: qty 27

## 2019-06-05 MED ORDER — FAMOTIDINE IN NACL 20-0.9 MG/50ML-% IV SOLN
20.0000 mg | Freq: Once | INTRAVENOUS | Status: AC
  Administered 2019-06-05: 20 mg via INTRAVENOUS

## 2019-06-05 MED ORDER — PALONOSETRON HCL INJECTION 0.25 MG/5ML
INTRAVENOUS | Status: AC
  Filled 2019-06-05: qty 5

## 2019-06-05 MED ORDER — SODIUM CHLORIDE 0.9 % IV SOLN
Freq: Once | INTRAVENOUS | Status: AC
  Administered 2019-06-05: 15:00:00 via INTRAVENOUS
  Filled 2019-06-05: qty 250

## 2019-06-05 MED ORDER — DIPHENHYDRAMINE HCL 50 MG/ML IJ SOLN
25.0000 mg | Freq: Once | INTRAMUSCULAR | Status: AC
  Administered 2019-06-05: 15:00:00 25 mg via INTRAVENOUS

## 2019-06-05 MED ORDER — PALONOSETRON HCL INJECTION 0.25 MG/5ML
0.2500 mg | Freq: Once | INTRAVENOUS | Status: AC
  Administered 2019-06-05: 15:00:00 0.25 mg via INTRAVENOUS

## 2019-06-05 MED ORDER — GOSERELIN ACETATE 3.6 MG ~~LOC~~ IMPL
DRUG_IMPLANT | SUBCUTANEOUS | Status: AC
  Filled 2019-06-05: qty 3.6

## 2019-06-05 MED ORDER — SODIUM CHLORIDE 0.9 % IV SOLN
Freq: Once | INTRAVENOUS | Status: AC
  Administered 2019-06-05: 16:00:00 via INTRAVENOUS
  Filled 2019-06-05: qty 5

## 2019-06-05 MED ORDER — SODIUM CHLORIDE 0.9 % IV SOLN
300.0000 mg | Freq: Once | INTRAVENOUS | Status: AC
  Administered 2019-06-05: 18:00:00 300 mg via INTRAVENOUS
  Filled 2019-06-05: qty 30

## 2019-06-05 NOTE — Progress Notes (Signed)
Per Wilber Bihari, NP okay for patient to recive treatment today with ALT 145.

## 2019-06-05 NOTE — Progress Notes (Signed)
Spoke w/ Dr. Jana Hakim about recent LFT elevation. Keep dose of paclitaxel 80mg /m2. He believes the LFT flare was more likely related to the [facility], which will be held ~6 weeks.   Demetrius Charity, PharmD, [facility] Pharmacist Pharmacy Phone: [phone] 06/05/2019

## 2019-06-05 NOTE — Telephone Encounter (Signed)
Spoke with patient to follow up.  She is due for cycle 4 taxol today.  She states she is doing ok.  Her bowel movements have normalized.  She states she feels nervous that her liver function may still be elevated and she won't receive her treatment again.  She states she is sending her fiance to an apartment complex to get application because she was not able to get the website to see about getting into a different apartment.  She states she forgets to ask the MD questions she has when she is there.  I instructed her to write the questions down when she thinks of them so she will have them when she goes to her appointment.  Patient verbalized understanding.

## 2019-06-05 NOTE — Progress Notes (Signed)
[place]  [phone] [phone]    ID: [name] DOB: [birth year]  MR#: [id number]  LOV#:[id number]  Patient Care Team: Patient, No Pcp Per as PCP - General (General Practice) Alphonsa Overall, MD as Consulting Physician (General Surgery) Magrinat, [name], MD as Consulting Physician (Oncology) Kyung Rudd, MD as Consulting Physician (Radiation Oncology) Rockwell Germany, RN as Oncology Nurse Navigator Mauro Kaufmann, RN as Oncology Nurse Navigator Donnamae Jude, MD as Consulting Physician (Obstetrics and Gynecology) Pavelock, Ralene Bathe, MD as Consulting Physician (Internal Medicine) OTHER MD:   CHIEF COMPLAINT: Triple negative breast cancer  CURRENT TREATMENT: Neoadjuvant chemotherapy/immunotherapy/goserelin   INTERVAL HISTORY: [name] is here today for follow up and treatment of her estrogen negative breast cancer. She is receiving neoadjuvant chemotherapy with Paclitaxel and Carboplatin.  This was held last week due to elevated LFTs.  [name] has been tearful and worried due to this.  She has some questions about her staging scans.  She will receive her second goserelin today.   REVIEW OF SYSTEMS: [name] has some irritation in her perineal area she says from wiping.  She denies any skin break down or h/o STI.  She notes everything is going good at home with her children.  She notes that she is working with Tivis Ringer and Helene Kelp on getting resources for someone coming to her home to help with children with special needs.    [name] denies any fever or chills.  She is without cough, shortness of breath, chest pain, or palpitations.  She denies nausea, vomiting, bowel/bladder changes.  A detailed ROS was otherwise non contributory.     HISTORY OF CURRENT ILLNESS: From the original intake note:  [name] presented with swelling along the left side of the face with neck pain. She then underwent a neck CT on 04/11/2019 showing:  Enlarged left level 2 lymph nodes with associated inflammatory change including stranding of the adjacent fat and thickening of the platysma. While malignancy is not excluded, this is most concerning for infection. No focal abscess is present. Hypoattenuation within 1 of the left level 2 lymph nodes likely represents central necrosis or suppuration of the node. No primary malignancy or other abscess. Left upper lobe pulmonary nodules may be inflammatory. The largest measures 0.7 x 0.7 x 0.6 cm. CT of the chest with contrast may be useful for further evaluation.  She also underwent a chest CT on the same day showing: Complex right breast mass measuring 5.6 x 4.5 x 4.7 cm consistent with a primary breast carcinoma. Malignant right axillary adenopathy measuring up to 3.1 cm. Multiple bilateral pulmonary nodules are concerning for metastatic disease. Given the right breast mass, the left cervical lymph nodes are more likely malignant.  She then underwent bilateral diagnostic mammography with tomography and right breast ultrasonography at [facility] on 04/19/2019 showing: Breast Density Category C; findings which are highly suspicious for multicentric inflammatory right breast cancer with right axillary nodal metastatic disease; no mammographic evidence of malignancy involving the left breast.  Accordingly on 04/19/2019 she proceeded to biopsy of the right breast area in question. The pathology from this procedure showed ([id number]): invasive ductal carcinoma, grade III, upper inner quadrant, 12:30 o'clock, 5.0 cm from the nipple. Prognostic indicators significant for: estrogen receptor, 0% negative and progesterone receptor, 0% negative. Proliferation marker Ki67 at 90%. HER2 negative (0+).  Additional biopsies of the right breast and right axilla were performed on the same day. The pathology from this procedure showed ([id number]): 2.  Breast, right, needle core biopsy, satellite mass UOQ, 10 o'clock,  8cmfn  - Invasive ductal carcinoma, grade III 3. Lymph node, needle/core biopsy, level 1 right axilla   - Invasive ductal carcinoma, grade III  The patient's subsequent history is as detailed below.   PAST MEDICAL HISTORY: Past Medical History:  Diagnosis Date   Anemia    after pregnancy   Anxiety    severe not taking meds   Asthma    inhaler  4x per day   Chronic pain    Family history of breast cancer    History of substance abuse ([place])    Pregnancy induced hypertension    takes medication now.   Psoriasis    Trichomonas infection    Vaginal Pap smear, abnormal      PAST SURGICAL HISTORY: Past Surgical History:  Procedure Laterality Date   CESAREAN SECTION  05/26/2008   CESAREAN SECTION N/A 07/10/2017   Procedure: CESAREAN SECTION;  Surgeon: Florian Buff, MD;  Location: [facility];  Service: Obstetrics;  Laterality: N/A;   CESAREAN SECTION N/A 09/23/2018   Procedure: CESAREAN SECTION;  Surgeon: Donnamae Jude, MD;  Location: [facility];  Service: Obstetrics;  Laterality: N/A;   COLPOSCOPY     PORTACATH PLACEMENT Left 04/30/2019   Procedure: INSERTION PORT-A-CATH WITH ULTRASOUND;  Surgeon: Alphonsa Overall, MD;  Location: [facility];  Service: General;  Laterality: Left;  Left Subclavian vein   WISDOM TOOTH EXTRACTION       FAMILY HISTORY: Family History  Problem Relation Age of Onset   Kidney disease Maternal Grandmother    Breast cancer Maternal Grandmother 28       metastatic to liver; d. 32   Kidney disease Maternal Grandfather    Kidney disease Paternal Grandmother    Diabetes Paternal Grandmother    Breast cancer Paternal Grandmother    Kidney disease Paternal Grandfather    Breast cancer Paternal Aunt    Cerebral palsy Child    Breast cancer Paternal Great-grandmother    Pancreatic cancer Other        PGMs brother    Throat cancer Paternal Uncle    Breast cancer Other        PGMs sister  (Updated  04/25/2019) Madyson's father is living at age 1. Patients' mother is also living at age 23. Marland KitchenTanzania notes, however, that she was raised by her grandmother.) The patient has 0 brothers and 5 sisters. Patient denies anyone in her family having ovarian, prostate, or pancreatic cancer. Her maternal grandmother was diagnosed with breast cancer, unsure what age. On her father's side, she reports her grandmother had cysts in her breasts, her great-aunt might have had breast cancer, and her great-grandmother was diagnosed with breast cancer at an older age.   GYNECOLOGIC HISTORY:  No LMP recorded. (Menstrual status: Chemotherapy).  She had an emergent C-section in 09/23/2018 for abruptio placenta at 30 weeks pregnancy, also has a history of eclampsia Menarche:    years old Age at first live birth: 33 years old [place] P: 3 LMP: 04/18/2019 Contraceptive: none HRT: none  Hysterectomy?: no BSO?: no   SOCIAL HISTORY: (Current as of 04/25/2019) [name] is currently unemployed. She previously worked at [employer]. She is currently engaged. Fiance [name] runs a Midwife and works other odd jobs. Daughter Demetrius Charity, age 54, was abused by her father at 28 months and has cerebral palsy. Son [name], age 58, has severe asthma and is developmentally disabled. Daughter Yetta Glassman was born on  09/23/2018 prematurely and remains on oxygen at home.   ADVANCED DIRECTIVES: not in place.    HEALTH MAINTENANCE: Social History   Tobacco Use   Smoking status: Former Smoker    Packs/day: 0.50    Years: 5.00    Pack years: 2.50    Types: Cigarettes    Last attempt to quit: 04/10/2019    Years since quitting: 0.1   Smokeless tobacco: Never Used   Tobacco comment: trying to quit  Substance Use Topics   Alcohol use: No   Drug use: Yes    Frequency: 7.0 times per week    Types: Marijuana    Comment: subutex    Colonoscopy: never done  PAP: 05/10/2018  Bone density: never done Mammography: first  performed following abnormal CT  No Known Allergies  Current Outpatient Medications  Medication Sig Dispense Refill   albuterol (PROVENTIL HFA;VENTOLIN HFA) 108 (90 Base) MCG/ACT inhaler Inhale 2 puffs into the lungs 4 (four) times daily. 1 Inhaler 2   buprenorphine (SUBUTEX) 2 MG SUBL SL tablet Place 4 mg under the tongue at bedtime.     buprenorphine (SUBUTEX) 8 MG SUBL SL tablet Place 8 mg under the tongue daily.      cholestyramine (QUESTRAN) 4 g packet Take 1 packet (4 g total) by mouth 3 (three) times daily with meals. 60 each 12   cyclobenzaprine (FLEXERIL) 10 MG tablet Take 1 tablet (10 mg total) by mouth 3 (three) times daily as needed for muscle spasms. 90 tablet 2   dexamethasone (DECADRON) 4 MG tablet Take 4 mg by mouth daily. Take 2 tablets by mouth daily on the day after chemotherapy, and then take 2 tablets twice daily for 2 days afterwards.  Take with food.     enalapril (VASOTEC) 10 MG tablet TAKE 1 TABLET BY MOUTH DAILY (Patient taking differently: Take 10 mg by mouth daily. ) 90 tablet 0   gabapentin (NEURONTIN) 300 MG capsule Take 1 capsule (300 mg total) by mouth 3 (three) times daily. 90 capsule 0   omeprazole (PRILOSEC) 40 MG capsule Take 1 capsule (40 mg total) by mouth daily. 30 capsule 5   ondansetron (ZOFRAN) 8 MG tablet Take 1 tablet (8 mg total) by mouth every 8 (eight) hours as needed for nausea or vomiting. 20 tablet 0   PARoxetine (PAXIL) 10 MG tablet Take 1 tablet (10 mg total) by mouth daily. (Patient taking differently: Take 10 mg by mouth daily. Patient is taking 2 tablets daily per MD) 30 tablet 5   Prenatal MV-Min-FA-Omega-3 (PRENATAL GUMMIES/DHA & FA) 0.4-32.5 MG CHEW Chew 2 each by mouth daily.     No current facility-administered medications for this visit.    Facility-Administered Medications Ordered in Other Visits  Medication Dose Route Frequency Provider Last Rate Last Dose   0.9 % NaCl with KCl 20 mEq/ L  infusion   Intravenous Once  Magrinat, [name], MD         OBJECTIVE:  Vitals:   06/05/19 1324  BP: 111/84  Pulse: 91  Resp: 18  Temp: 98.5 F (36.9 C)  SpO2: 100%     Body mass index is 38.01 kg/m.   Wt Readings from Last 3 Encounters:  06/05/19 194 lb 9.6 oz (88.3 kg)  05/23/19 193 lb 6.4 oz (87.7 kg)  05/15/19 187 lb 11.2 oz (85.1 kg)    ECOG FS:1 - Symptomatic but completely ambulatory GENERAL: Patient is a well appearing female in no acute distress HEENT:  Sclerae anicteric.  Oropharynx clear and moist. No ulcerations or evidence of oropharyngeal candidiasis. Neck is supple.  NODES:  No cervical, supraclavicular, or axillary lymphadenopathy palpated.  BREAST EXAM:  Deferred. LUNGS:  Clear to auscultation bilaterally.  No wheezes or rhonchi. HEART:  Regular rate and rhythm. No murmur appreciated. ABDOMEN:  Soft, nontender.  Positive, normoactive bowel sounds. No organomegaly palpated. MSK:  No focal spinal tenderness to palpation. Full range of motion bilaterally in the upper extremities. EXTREMITIES:  No peripheral edema.   SKIN:  Clear with no obvious rashes or skin changes. No nail dyscrasia. NEURO:  Nonfocal. Well oriented.  Appropriate affect.    LAB RESULTS:  CMP     Component Value Date/Time   NA 140 05/30/2019 0840   NA 137 05/10/2018 1535   K 4.2 05/30/2019 0840   CL 104 05/30/2019 0840   CO2 26 05/30/2019 0840   GLUCOSE 106 (H) 05/30/2019 0840   BUN 13 05/30/2019 0840   BUN 7 05/10/2018 1535   CREATININE 0.91 05/30/2019 0840   CREATININE 0.78 05/11/2019 1305   CALCIUM 8.7 (L) 05/30/2019 0840   PROT 6.3 (L) 05/30/2019 0840   PROT 7.2 05/10/2018 1535   ALBUMIN 3.4 (L) 05/30/2019 0840   ALBUMIN 4.2 05/10/2018 1535   AST 432 (HH) 05/30/2019 0840   AST 20 05/11/2019 1305   ALT 510 (HH) 05/30/2019 0840   ALT 44 05/11/2019 1305   ALKPHOS 104 05/30/2019 0840   BILITOT 0.2 (L) 05/30/2019 0840   BILITOT 0.4 05/11/2019 1305   GFRNONAA >60 05/30/2019 0840   GFRNONAA >60 05/11/2019  1305   GFRAA >60 05/30/2019 0840   GFRAA >60 05/11/2019 1305    No results found for: TOTALPROTELP, ALBUMINELP, A1GS, A2GS, BETS, BETA2SER, GAMS, MSPIKE, SPEI  No results found for: KPAFRELGTCHN, LAMBDASER, KAPLAMBRATIO  Lab Results  Component Value Date   WBC 4.2 06/05/2019   NEUTROABS 2.1 06/05/2019   HGB 10.9 (L) 06/05/2019   HCT 33.6 (L) 06/05/2019   MCV 95.2 06/05/2019   PLT 298 06/05/2019    _0 @  No results found for: LABCA2  No components found for: CVELFY101  No results for input(s): INR in the last 168 hours.  No results found for: LABCA2  No results found for: BPZ025  No results found for: ENI778  No results found for: EUM353  No results found for: CA2729  No components found for: HGQUANT  No results found for: CEA1 / No results found for: CEA1   No results found for: AFPTUMOR  No results found for: CHROMOGRNA  No results found for: PSA1  Appointment on 06/05/2019  Component Date Value Ref Range Status   WBC 06/05/2019 4.2  4.0 - 10.5 K/uL Final   RBC 06/05/2019 3.53* 3.87 - 5.11 MIL/uL Final   Hemoglobin 06/05/2019 10.9* 12.0 - 15.0 g/dL Final   HCT 06/05/2019 33.6* 36.0 - 46.0 % Final   MCV 06/05/2019 95.2  80.0 - 100.0 fL Final   MCH 06/05/2019 30.9  26.0 - 34.0 pg Final   MCHC 06/05/2019 32.4  30.0 - 36.0 g/dL Final   RDW 06/05/2019 16.1* 11.5 - 15.5 % Final   Platelets 06/05/2019 298  150 - 400 K/uL Final   nRBC 06/05/2019 0.0  0.0 - 0.2 % Final   Neutrophils Relative % 06/05/2019 51  % Final   Neutro Abs 06/05/2019 2.1  1.7 - 7.7 K/uL Final   Lymphocytes Relative 06/05/2019 40  % Final   Lymphs Abs 06/05/2019 1.7  0.7 - 4.0 K/uL  Final   Monocytes Relative 06/05/2019 9  % Final   Monocytes Absolute 06/05/2019 0.4  0.1 - 1.0 K/uL Final   Eosinophils Relative 06/05/2019 0  % Final   Eosinophils Absolute 06/05/2019 0.0  0.0 - 0.5 K/uL Final   Basophils Relative 06/05/2019 0  % Final   Basophils Absolute  06/05/2019 0.0  0.0 - 0.1 K/uL Final   Immature Granulocytes 06/05/2019 0  % Final   Abs Immature Granulocytes 06/05/2019 0.01  0.00 - 0.07 K/uL Final   Performed at [facility], [address] [place], [place] [address]    (this displays the last labs from the last 3 days)  No results found for: TOTALPROTELP, ALBUMINELP, A1GS, A2GS, BETS, BETA2SER, GAMS, MSPIKE, SPEI (this displays SPEP labs)  No results found for: KPAFRELGTCHN, LAMBDASER, KAPLAMBRATIO (kappa/lambda light chains)  Lab Results  Component Value Date   HGBA 97.4 12/16/2016   (Hemoglobinopathy evaluation)   No results found for: LDH  No results found for: IRON, TIBC, IRONPCTSAT (Iron and TIBC)  No results found for: FERRITIN  Urinalysis    Component Value Date/Time   COLORURINE COLORLESS (A) 08/20/2018 0704   APPEARANCEUR CLEAR 08/20/2018 0704   LABSPEC 1.003 (L) 08/20/2018 0704   PHURINE 7.0 08/20/2018 0704   GLUCOSEU NEGATIVE 08/20/2018 0704   HGBUR NEGATIVE 08/20/2018 0704   BILIRUBINUR NEGATIVE 08/20/2018 0704   KETONESUR NEGATIVE 08/20/2018 0704   PROTEINUR NEGATIVE 08/20/2018 0704   UROBILINOGEN 4.0 (H) 01/13/2018 1417   NITRITE NEGATIVE 08/20/2018 0704   LEUKOCYTESUR NEGATIVE 08/20/2018 0704     STUDIES:  Nm Bone Scan Whole Body  Result Date: 05/10/2019 CLINICAL DATA:  Breast cancer EXAM: NUCLEAR MEDICINE WHOLE BODY BONE SCAN TECHNIQUE: Whole body anterior and posterior images were obtained approximately 3 hours after intravenous injection of radiopharmaceutical. RADIOPHARMACEUTICALS:  21.6 mCi Technetium-74mMDP IV COMPARISON:  Abdomen and pelvis CT 05/09/2019.  Chest CT 04/11/2019. FINDINGS: No focal radiotracer accumulation within the bony anatomy to suggest osseous metastatic disease. IMPRESSION: Negative for bony metastatic involvement. Electronically Signed   By: EMisty StanleyM.D.   On: 05/10/2019 09:12   Ct Abdomen Pelvis W Contrast  Result Date:  05/10/2019 CLINICAL DATA:  Right-sided breast cancer EXAM: CT ABDOMEN AND PELVIS WITH CONTRAST TECHNIQUE: Multidetector CT imaging of the abdomen and pelvis was performed using the standard protocol following bolus administration of intravenous contrast. CONTRAST:  1047mOMNIPAQUE IOHEXOL 300 MG/ML  SOLN COMPARISON:  None. FINDINGS: Lower chest: Skin thickening and infiltrative changes noted right breast. Small pulmonary nodules are identified in the lung bases bilaterally, as noted on recent dedicated chest CT. Some of the pulmonary nodules are new including posterior right lower lobe 4 mm nodule on 11/6 and 4 mm posterior right lower lobe nodule on 07/6. Hepatobiliary: 5 mm low-density lesion lateral segment left liver (18/2) is too small to characterize. There is no evidence for gallstones, gallbladder wall thickening, or pericholecystic fluid. No intrahepatic or extrahepatic biliary dilation. Pancreas: No focal mass lesion. No dilatation of the main duct. No intraparenchymal cyst. No peripancreatic edema. Spleen: No splenomegaly. No focal mass lesion. Adrenals/Urinary Tract: No adrenal nodule or mass. Kidneys unremarkable. No evidence for hydroureter. The urinary bladder appears normal for the degree of distention. Stomach/Bowel: Stomach is unremarkable. No gastric wall thickening. No evidence of outlet obstruction. Duodenum is normally positioned as is the ligament of Treitz. No small bowel wall thickening. No small bowel dilatation. The terminal ileum is normal.The appendix is normal. No gross colonic  mass. No colonic wall thickening. Vascular/Lymphatic: No abdominal aortic aneurysm. No abdominal aortic atherosclerotic calcification. There is no gastrohepatic or hepatoduodenal ligament lymphadenopathy. No intraperitoneal or retroperitoneal lymphadenopathy. No pelvic sidewall lymphadenopathy. Reproductive: The uterus is unremarkable.  There is no adnexal mass. Other: No intraperitoneal free fluid.  Musculoskeletal: Sclerosis noted at the SI joints bilaterally, noted on x-ray from 09/23/2018. IMPRESSION: 1. Pulmonary nodules in the lung bases concerning for metastatic disease. 2. No evidence for soft tissue metastatic disease in the abdomen or pelvis. Attention to a tiny low-density lesion in the lateral segment left liver is recommended on follow-up. 3. Sclerosis at the right SI joint, likely degenerative or inflammatory. No appreciable uptake on bone scan today to suggest metastatic involvement. Electronically Signed   By: Misty Stanley M.D.   On: 05/10/2019 09:56     ELIGIBLE FOR AVAILABLE RESEARCH PROTOCOL: no   ASSESSMENT: 33 y.o. [place] woman status post right breast biopsy x2 and right axillary lymph node biopsy 04/19/2019 for a clinical T4 N2 MX, stage IIIC invasive ductal carcinoma, grade 3, triple negative, with an MIB-1 of 90%  (a) chest CT scan 04/11/2019 shows multiple subcentimeter lung nodules  (b) PET scan denied by insurance, CT A/P and bone scan due 05/09/2019  (c) baseline breast MRI 04/30/2019 shows involvement of all 4 quadrants in the right breast as well as right axillary subpectoral and internal mammary lymphadenopathy.  Left breast was unremarkable  (1) neoadjuvant chemotherapy will consist of   (a) pembrolizumab started 05/03/2019, repeated every 21 days   (b) paclitaxel weekly x12 with carboplatin every 21 days x 4, started 05/08/2019   (i) tolerated carboplatin AUC 5-day 1 poorly, switched to AUC 2 on day 1 day 8   (ii) cycle 2 delayed 1 week because of elevated liver function tests  (c) cyclophosphamide and doxorubicin in dose dense fashion x4 to follow   (2) definitive surgery pending  (3) adjuvant radiation to follow  (4) genetics testing  (a) Negative genetic testing on the common hereditary cancer panel.  The Common Hereditary Gene Panel offered by [facility] includes sequencing and/or deletion duplication testing of the following 48 genes: APC, ATM,  AXIN2, BARD1, BMPR1A, BRCA1, BRCA2, BRIP1, CDH1, CDK4, CDKN2A (p14ARF), CDKN2A (p16INK4a), CHEK2, CTNNA1, DICER1, EPCAM (Deletion/duplication testing only), GREM1 (promoter region deletion/duplication testing only), KIT, MEN1, MLH1, MSH2, MSH3, MSH6, MUTYH, NBN, NF1, NHTL1, PALB2, PDGFRA, PMS2, POLD1, POLE, PTEN, RAD50, RAD51C, RAD51D, RNF43, SDHB, SDHC, SDHD, SMAD4, SMARCA4. STK11, TP53, TSC1, TSC2, and VHL.  The following genes were evaluated for sequence changes only: SDHA and HOXB13 c.251G>A variant only. The report date is May 14, 2019.  (5) goserelin started 05/08/2019   PLAN:  [name] is doing well today.  I reviewed her labs and they remain stable.  I am happy to hear that she is getting some additional support for her children with special needs.    Her LFT elevation has resolved, and she can proceed with Paclitaxel and Carboplatin today.  [name] was very tearful about her liver and she had read her CT report and wanted me to explain it to her.  She didn't understand what unremarkable meant.  I reviewed with her that it means that there was nothing abnormal to note, in those areas.   Dr. Jana Hakim believes that the transaminitis is due to the [place], so we will be holding that for a couple of cycles.    [name] will continue to receive Goserelin every 4 weeks.    [name] will return in  1 week for labs, and her next weekly treatment with Pacltaxel and Carboplatin.  We will see her in 2 weeks.  She knows to call for any questions or concerns prior to her next visit, and nursing and social work is also following along closely.    A total of (30) minutes of face-to-face time was spent with this patient with greater than 50% of that time in counseling and care-coordination.   Wilber Bihari, NP Medical Oncology and Hematology [facility] [address] [place], [place] [address] Tel. [phone]    Fax. [phone]

## 2019-06-05 NOTE — Patient Instructions (Addendum)
Goserelin injection What is this medicine? GOSERELIN (GOE se rel in) is similar to a hormone found in the body. It lowers the amount of sex hormones that the body makes. Men will have lower testosterone levels and women will have lower estrogen levels while taking this medicine. In men, this medicine is used to treat prostate cancer; the injection is either given once per month or once every 12 weeks. A once per month injection (only) is used to treat women with endometriosis, dysfunctional uterine bleeding, or advanced breast cancer. This medicine may be used for other purposes; ask your health care provider or pharmacist if you have questions. COMMON BRAND NAME(S): Zoladex What should I tell my health care provider before I take this medicine? They need to know if you have any of these conditions (some only apply to women): -diabetes -heart disease or previous heart attack -high blood pressure -high cholesterol -kidney disease -osteoporosis or low bone density -problems passing urine -spinal cord injury -stroke -tobacco smoker -an unusual or allergic reaction to goserelin, hormone therapy, other medicines, foods, dyes, or preservatives -pregnant or trying to get pregnant -breast-feeding How should I use this medicine? This medicine is for injection under the skin. It is given by a health care professional in a hospital or clinic setting. Men receive this injection once every 4 weeks or once every 12 weeks. Women will only receive the once every 4 weeks injection. Talk to your pediatrician regarding the use of this medicine in children. Special care may be needed. Overdosage: If you think you have taken too much of this medicine contact a poison control center or emergency room at once. NOTE: This medicine is only for you. Do not share this medicine with others. What if I miss a dose? It is important not to miss your dose. Call your doctor or health care professional if you are unable to  keep an appointment. What may interact with this medicine? -female hormones like estrogen -herbal or dietary supplements like black cohosh, chasteberry, or DHEA -female hormones like testosterone -prasterone This list may not describe all possible interactions. Give your health care provider a list of all the medicines, herbs, non-prescription drugs, or dietary supplements you use. Also tell them if you smoke, drink alcohol, or use illegal drugs. Some items may interact with your medicine. What should I watch for while using this medicine? Visit your doctor or health care professional for regular checks on your progress. Your symptoms may appear to get worse during the first weeks of this therapy. Tell your doctor or healthcare professional if your symptoms do not start to get better or if they get worse after this time. Your bones may get weaker if you take this medicine for a long time. If you smoke or frequently drink alcohol you may increase your risk of bone loss. A family history of osteoporosis, chronic use of drugs for seizures (convulsions), or corticosteroids can also increase your risk of bone loss. Talk to your doctor about how to keep your bones strong. This medicine should stop regular monthly menstration in women. Tell your doctor if you continue to menstrate. Women should not become pregnant while taking this medicine or for 12 weeks after stopping this medicine. Women should inform their doctor if they wish to become pregnant or think they might be pregnant. There is a potential for serious side effects to an unborn child. Talk to your health care professional or pharmacist for more information. Do not breast-feed an infant while taking   this medicine. Men should inform their doctors if they wish to father a child. This medicine may lower sperm counts. Talk to your health care professional or pharmacist for more information. What side effects may I notice from receiving this  medicine? Side effects that you should report to your doctor or health care professional as soon as possible: -allergic reactions like skin rash, itching or hives, swelling of the face, lips, or tongue -bone pain -breathing problems -changes in vision -chest pain -feeling faint or lightheaded, falls -fever, chills -pain, swelling, warmth in the leg -pain, tingling, numbness in the hands or feet -signs and symptoms of low blood pressure like dizziness; feeling faint or lightheaded, falls; unusually weak or tired -stomach pain -swelling of the ankles, feet, hands -trouble passing urine or change in the amount of urine -unusually high or low blood pressure -unusually weak or tired Side effects that usually do not require medical attention (report to your doctor or health care professional if they continue or are bothersome): -change in sex drive or performance -changes in breast size in both males and females -changes in emotions or moods -headache -hot flashes -irritation at site where injected -loss of appetite -skin problems like acne, dry skin -vaginal dryness This list may not describe all possible side effects. Call your doctor for medical advice about side effects. You may report side effects to FDA at 1-800-FDA-1088. Where should I keep my medicine? This drug is given in a hospital or clinic and will not be stored at home. NOTE: This sheet is a summary. It may not cover all possible information. If you have questions about this medicine, talk to your doctor, pharmacist, or health care provider.  2019 Elsevier/Gold Standard (2014-02-19 11:10:35)  [facility] Discharge Instructions for Patients Receiving Chemotherapy  Today you received the following chemotherapy agents Taxol and Carboplatin To help prevent nausea and vomiting after your treatment, we encourage you to take your nausea medication.   If you develop nausea and vomiting that is not controlled by  your nausea medication, call the clinic.   BELOW ARE SYMPTOMS THAT SHOULD BE REPORTED IMMEDIATELY:  *FEVER GREATER THAN 100.5 F  *CHILLS WITH OR WITHOUT FEVER  NAUSEA AND VOMITING THAT IS NOT CONTROLLED WITH YOUR NAUSEA MEDICATION  *UNUSUAL SHORTNESS OF BREATH  *UNUSUAL BRUISING OR BLEEDING  TENDERNESS IN MOUTH AND THROAT WITH OR WITHOUT PRESENCE OF ULCERS  *URINARY PROBLEMS  *BOWEL PROBLEMS  UNUSUAL RASH Items with * indicate a potential emergency and should be followed up as soon as possible.  Feel free to call the clinic should you have any questions or concerns. The clinic phone number is [phone].  Please show the [place] at check-in to the Emergency Department and triage nurse.

## 2019-06-06 ENCOUNTER — Encounter: Payer: Self-pay | Admitting: General Practice

## 2019-06-06 LAB — HEPATITIS C ANTIBODY: HCV Ab: <0.1 s/co ratio (ref 0.0–0.9)

## 2019-06-06 LAB — HEPATITIS B SURFACE ANTIGEN: Hepatitis B Surface Ag: NEGATIVE

## 2019-06-06 LAB — HEPATITIS B CORE ANTIBODY, IGM: Hep B C IgM: NEGATIVE

## 2019-06-06 NOTE — Progress Notes (Signed)
[place] Spiritual Care Note  Reached [name] by phone for f/u support. She was overall upbeat today because she feels so good even the day after chemo. 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 She requests twice weekly check-ins for emotional support and encouragement, so we plan to f/u by phone on Friday.   [place], [place], [facility] Pager [phone] Voicemail [phone]

## 2019-06-07 ENCOUNTER — Encounter: Payer: Self-pay | Admitting: Oncology

## 2019-06-07 ENCOUNTER — Other Ambulatory Visit: Payer: Self-pay | Admitting: Family Medicine

## 2019-06-07 ENCOUNTER — Other Ambulatory Visit: Payer: Self-pay | Admitting: Oncology

## 2019-06-08 ENCOUNTER — Encounter: Payer: Self-pay | Admitting: General Practice

## 2019-06-08 NOTE — Progress Notes (Signed)
[place] CSW Progress Notes  Called patient to check in on progress towards securing housing.  Has requested information from [facility] and [employer], has requested applications from them by email.  "I have been doing what I can over the phone" to apply for various properties.  Has called all the options provided by [facility] - has not received return calls from any property.    Has spoken with [facility], has been seeing Dr Tinnie Gens, psychiatrist, for substance abuse treatment since birth of [name] (now age 33). Clinic needs current medication list and information from patient's oncology clinic. [facility] does not have access to [employer].  Pt wants medical records faxed to [facility] and for [name] to contact the MD at [facility].  Is receiving substance abuse treatment at [facility] (gets Subutex from Dr Tinnie Gens for opiate abuse).  Pt also wants [name] to know that she is taking Subutex, was recently given 2 week prescription for this medication by [facility]. Fax number for [facility] clinic is [phone]/ZYSAY number is [phone].  Wants [place] providers to be in touch w [facility] in order to coordinate care.  MD informed by staff message.    Patient has application for disability in process with [facility], also working on application for disability for one of her children.   [name], LCSW Clinical Social Worker Phone:  [phone]

## 2019-06-08 NOTE — Progress Notes (Signed)
[facility] Spiritual Care Note  Phoned per pt request. LVM stating that I will call again next week, as planned.   [place], [place], [facility] Pager [phone] Voicemail [phone]

## 2019-06-12 ENCOUNTER — Other Ambulatory Visit: Payer: Self-pay

## 2019-06-12 ENCOUNTER — Inpatient Hospital Stay: Payer: Medicaid Other

## 2019-06-12 ENCOUNTER — Telehealth: Payer: Self-pay | Admitting: *Deleted

## 2019-06-12 VITALS — BP 107/72 | HR 92 | Temp 97.8°F | Resp 18

## 2019-06-12 DIAGNOSIS — Z171 Estrogen receptor negative status [ER-]: Secondary | ICD-10-CM

## 2019-06-12 DIAGNOSIS — C50811 Malignant neoplasm of overlapping sites of right female breast: Secondary | ICD-10-CM

## 2019-06-12 DIAGNOSIS — Z5111 Encounter for antineoplastic chemotherapy: Secondary | ICD-10-CM | POA: Diagnosis not present

## 2019-06-12 LAB — COMPREHENSIVE METABOLIC PANEL
ALT: 117 U/L — ABNORMAL HIGH (ref 0–44)
AST: 78 U/L — ABNORMAL HIGH (ref 15–41)
Albumin: 3.4 g/dL — ABNORMAL LOW (ref 3.5–5.0)
Alkaline Phosphatase: 96 U/L (ref 38–126)
Anion gap: 10 (ref 5–15)
BUN: 14 mg/dL (ref 6–20)
CO2: 28 mmol/L (ref 22–32)
Calcium: 8.3 mg/dL — ABNORMAL LOW (ref 8.9–10.3)
Chloride: 102 mmol/L (ref 98–111)
Creatinine, Ser: 0.86 mg/dL (ref 0.44–1.00)
GFR calc Af Amer: >60 mL/min (ref >60)
GFR calc non Af Amer: >60 mL/min (ref >60)
Glucose, Bld: 85 mg/dL (ref 70–99)
Potassium: 3.6 mmol/L (ref 3.5–5.1)
Sodium: 140 mmol/L (ref 135–145)
Total Bilirubin: <0.2 mg/dL — ABNORMAL LOW (ref 0.3–1.2)
Total Protein: 6.3 g/dL — ABNORMAL LOW (ref 6.5–8.1)

## 2019-06-12 LAB — CBC WITH DIFFERENTIAL/PLATELET
Abs Immature Granulocytes: 0.07 10*3/uL (ref 0.00–0.07)
Basophils Absolute: 0 10*3/uL (ref 0.0–0.1)
Basophils Relative: 0 %
Eosinophils Absolute: 0 10*3/uL (ref 0.0–0.5)
Eosinophils Relative: 0 %
HCT: 28.9 % — ABNORMAL LOW (ref 36.0–46.0)
Hemoglobin: 9.4 g/dL — ABNORMAL LOW (ref 12.0–15.0)
Immature Granulocytes: 1 %
Lymphocytes Relative: 46 %
Lymphs Abs: 3.2 10*3/uL (ref 0.7–4.0)
MCH: 31.2 pg (ref 26.0–34.0)
MCHC: 32.5 g/dL (ref 30.0–36.0)
MCV: 96 fL (ref 80.0–100.0)
Monocytes Absolute: 0.3 10*3/uL (ref 0.1–1.0)
Monocytes Relative: 5 %
Neutro Abs: 3.3 10*3/uL (ref 1.7–7.7)
Neutrophils Relative %: 48 %
Platelets: 309 10*3/uL (ref 150–400)
RBC: 3.01 MIL/uL — ABNORMAL LOW (ref 3.87–5.11)
RDW: 15.8 % — ABNORMAL HIGH (ref 11.5–15.5)
WBC: 6.9 10*3/uL (ref 4.0–10.5)
nRBC: 0 % (ref 0.0–0.2)

## 2019-06-12 MED ORDER — SODIUM CHLORIDE 0.9% FLUSH
10.0000 mL | INTRAVENOUS | Status: DC | PRN
  Administered 2019-06-12: 10 mL
  Filled 2019-06-12: qty 10

## 2019-06-12 MED ORDER — SODIUM CHLORIDE 0.9 % IV SOLN
300.0000 mg | Freq: Once | INTRAVENOUS | Status: AC
  Administered 2019-06-12: 14:00:00 300 mg via INTRAVENOUS
  Filled 2019-06-12: qty 30

## 2019-06-12 MED ORDER — FAMOTIDINE IN NACL 20-0.9 MG/50ML-% IV SOLN
20.0000 mg | Freq: Once | INTRAVENOUS | Status: AC
  Administered 2019-06-12: 11:00:00 20 mg via INTRAVENOUS

## 2019-06-12 MED ORDER — SODIUM CHLORIDE 0.9 % IV SOLN
80.0000 mg/m2 | Freq: Once | INTRAVENOUS | Status: AC
  Administered 2019-06-12: 13:00:00 162 mg via INTRAVENOUS
  Filled 2019-06-12: qty 27

## 2019-06-12 MED ORDER — DIPHENHYDRAMINE HCL 50 MG/ML IJ SOLN
INTRAMUSCULAR | Status: AC
  Filled 2019-06-12: qty 1

## 2019-06-12 MED ORDER — PALONOSETRON HCL INJECTION 0.25 MG/5ML
INTRAVENOUS | Status: AC
  Filled 2019-06-12: qty 5

## 2019-06-12 MED ORDER — HEPARIN SOD (PORK) LOCK FLUSH 100 UNIT/ML IV SOLN
500.0000 [IU] | Freq: Once | INTRAVENOUS | Status: AC | PRN
  Administered 2019-06-12: 14:00:00 500 [IU]
  Filled 2019-06-12: qty 5

## 2019-06-12 MED ORDER — PALONOSETRON HCL INJECTION 0.25 MG/5ML
0.2500 mg | Freq: Once | INTRAVENOUS | Status: AC
  Administered 2019-06-12: 0.25 mg via INTRAVENOUS

## 2019-06-12 MED ORDER — SODIUM CHLORIDE 0.9 % IV SOLN
Freq: Once | INTRAVENOUS | Status: AC
  Administered 2019-06-12: 11:00:00 via INTRAVENOUS
  Filled 2019-06-12: qty 250

## 2019-06-12 MED ORDER — SODIUM CHLORIDE 0.9 % IV SOLN
Freq: Once | INTRAVENOUS | Status: AC
  Administered 2019-06-12: 12:00:00 via INTRAVENOUS
  Filled 2019-06-12: qty 5

## 2019-06-12 MED ORDER — SODIUM CHLORIDE 0.9% FLUSH
10.0000 mL | INTRAVENOUS | Status: DC | PRN
  Filled 2019-06-12: qty 10

## 2019-06-12 MED ORDER — FAMOTIDINE IN NACL 20-0.9 MG/50ML-% IV SOLN
INTRAVENOUS | Status: AC
  Filled 2019-06-12: qty 50

## 2019-06-12 MED ORDER — DIPHENHYDRAMINE HCL 50 MG/ML IJ SOLN
25.0000 mg | Freq: Once | INTRAMUSCULAR | Status: AC
  Administered 2019-06-12: 25 mg via INTRAVENOUS

## 2019-06-12 NOTE — Telephone Encounter (Signed)
Per MD review of AST of 78 and ALT of 117 - order given to proceed with taxol treatment today.

## 2019-06-12 NOTE — Patient Instructions (Signed)
[facility] Discharge Instructions for Patients Receiving Chemotherapy  Today you received the following chemotherapy agents Taxol and Carboplatin.  To help prevent nausea and vomiting after your treatment, we encourage you to take your nausea medication.   If you develop nausea and vomiting that is not controlled by your nausea medication, call the clinic.   BELOW ARE SYMPTOMS THAT SHOULD BE REPORTED IMMEDIATELY:  *FEVER GREATER THAN 100.5 F  *CHILLS WITH OR WITHOUT FEVER  NAUSEA AND VOMITING THAT IS NOT CONTROLLED WITH YOUR NAUSEA MEDICATION  *UNUSUAL SHORTNESS OF BREATH  *UNUSUAL BRUISING OR BLEEDING  TENDERNESS IN MOUTH AND THROAT WITH OR WITHOUT PRESENCE OF ULCERS  *URINARY PROBLEMS  *BOWEL PROBLEMS  UNUSUAL RASH Items with * indicate a potential emergency and should be followed up as soon as possible.  Feel free to call the clinic should you have any questions or concerns. The clinic phone number is [phone].  Please show the CHEMO ALERT CARD at check-in to the Emergency Department and triage nurse.   

## 2019-06-13 ENCOUNTER — Encounter: Payer: Self-pay | Admitting: General Practice

## 2019-06-13 NOTE — Progress Notes (Signed)
[place] CSW Progress Notes  Called patient, reviewed her progress on seeking housing.  Has not located housing yet - has to secure housing before [employer] expires.  Has communicated w various landlords by phone and email - encouraged her to continue to try to submit applications and view available properties on [email or web address]  "I really want a house, but I will be OK with an apartment, I guess."  Feeling poorly after yesterday's chemo but did go house hunting w fiance.  Encouraged her to keep trying.  [name], LCSW Clinical Social Worker Phone:  [phone]

## 2019-06-18 NOTE — Progress Notes (Signed)
[place]  [phone]) [phone] [phone]    ID: Terri Estes DOB: 1986-03-26  MR#: [id number]  HAL#:[id number]  Patient Care Team: Patient, No Pcp Per as PCP - General (General Practice) Alphonsa Overall, MD as Consulting Physician (General Surgery) Treston Coker, [name], MD as Consulting Physician (Oncology) Kyung Rudd, MD as Consulting Physician (Radiation Oncology) Rockwell Germany, RN as Oncology Nurse Navigator Mauro Kaufmann, RN as Oncology Nurse Navigator Donnamae Jude, MD as Consulting Physician (Obstetrics and Gynecology) Pavelock, Ralene Bathe, MD as Consulting Physician (Internal Medicine) OTHER MD: Joylene Igo for [facility] is [phone]   CHIEF COMPLAINT: Triple negative breast cancer  CURRENT TREATMENT: Neoadjuvant chemotherapy[/immunotherapy]/goserelin   INTERVAL HISTORY: Terri Estes returns today for follow-up and treatment of her estrogen negative breast cancer.  She continues on neoadjuvant chemotherapy consisting of paclitaxel and carboplatin weekly x12. Today is day 1 cycle 4. She notes that she feels fatigued all the time. She says like her skin has drawn up and she has to stretch it back out; she has been getting sores in her mouth and her skin is dry. She also has some vaginal irritation, that she describes as being UTI like, but it comes and goes with treatments. She hasn't had any dishcharge, but she feels some burning when she uses the bathroom. She also has had some diarrhea.  Lucrezia Starch is helping with that  She is also receiving goserelin every 28 days, with her most recent dose 06/05/2019. Her periods have stopped, but her right breast is still leaking.  Since her last visit here, she has not undergone any additional studies.     REVIEW OF SYSTEMS: Terri Estes denies unusual headaches, visual changes, nausea, vomiting, or dizziness. There has been no unusual cough, phlegm production, or pleurisy. The patient denies unexplained  weight loss, bleeding, or fever. A detailed review of systems was otherwise noncontributory.    HISTORY OF CURRENT ILLNESS: From the original intake note:  [name] presented with swelling along the left side of the face with neck pain. She then underwent a neck CT on 04/11/2019 showing: Enlarged left level 2 lymph nodes with associated inflammatory change including stranding of the adjacent fat and thickening of the platysma. While malignancy is not excluded, this is most concerning for infection. No focal abscess is present. Hypoattenuation within 1 of the left level 2 lymph nodes likely represents central necrosis or suppuration of the node. No primary malignancy or other abscess. Left upper lobe pulmonary nodules may be inflammatory. The largest measures 0.7 x 0.7 x 0.6 cm. CT of the chest with contrast may be useful for further evaluation.  She also underwent a chest CT on the same day showing: Complex right breast mass measuring 5.6 x 4.5 x 4.7 cm consistent with a primary breast carcinoma. Malignant right axillary adenopathy measuring up to 3.1 cm. Multiple bilateral pulmonary nodules are concerning for metastatic disease. Given the right breast mass, the left cervical lymph nodes are more likely malignant.  She then underwent bilateral diagnostic mammography with tomography and right breast ultrasonography at [name] on 04/19/2019 showing: Breast Density Category C; findings which are highly suspicious for multicentric inflammatory right breast cancer with right axillary nodal metastatic disease; no mammographic evidence of malignancy involving the left breast.  Accordingly on 04/19/2019 she proceeded to biopsy of the right breast area in question. The pathology from this procedure showed ([id number]): invasive ductal carcinoma, grade III, upper inner quadrant, 12:30 o'clock, 5.0 cm from the nipple. Prognostic indicators  significant for: estrogen receptor, 0% negative and  progesterone receptor, 0% negative. Proliferation marker Ki67 at 90%. HER2 negative (0+).  Additional biopsies of the right breast and right axilla were performed on the same day. The pathology from this procedure showed ([id number]): 2. Breast, right, needle core biopsy, satellite mass UOQ, 10 o'clock, 8cmfn  - Invasive ductal carcinoma, grade III 3. Lymph node, needle/core biopsy, level 1 right axilla   - Invasive ductal carcinoma, grade III  The patient's subsequent history is as detailed below.   PAST MEDICAL HISTORY: Past Medical History:  Diagnosis Date   Anemia    after pregnancy   Anxiety    severe not taking meds   Asthma    inhaler  4x per day   Chronic pain    Family history of breast cancer    History of substance abuse (Bromide)    Pregnancy induced hypertension    takes medication now.   Psoriasis    Trichomonas infection    Vaginal Pap smear, abnormal      PAST SURGICAL HISTORY: Past Surgical History:  Procedure Laterality Date   CESAREAN SECTION  05/26/2008   CESAREAN SECTION N/A 07/10/2017   Procedure: CESAREAN SECTION;  Surgeon: Florian Buff, MD;  Location: [facility];  Service: Obstetrics;  Laterality: N/A;   CESAREAN SECTION N/A 09/23/2018   Procedure: CESAREAN SECTION;  Surgeon: Donnamae Jude, MD;  Location: [facility];  Service: Obstetrics;  Laterality: N/A;   COLPOSCOPY     PORTACATH PLACEMENT Left 04/30/2019   Procedure: INSERTION PORT-A-CATH WITH ULTRASOUND;  Surgeon: Alphonsa Overall, MD;  Location: [facility];  Service: General;  Laterality: Left;  Left Subclavian vein   WISDOM TOOTH EXTRACTION       FAMILY HISTORY: Family History  Problem Relation Age of Onset   Kidney disease Maternal Grandmother    Breast cancer Maternal Grandmother 28       metastatic to liver; d. 32   Kidney disease Maternal Grandfather    Kidney disease Paternal Grandmother    Diabetes Paternal Grandmother    Breast cancer Paternal  Grandmother    Kidney disease Paternal Grandfather    Breast cancer Paternal Aunt    Cerebral palsy Child    Breast cancer Paternal Great-grandmother    Pancreatic cancer Other        PGMs brother    Throat cancer Paternal Uncle    Breast cancer Other        PGMs sister  (Updated 04/25/2019) Lindsi's father is living at age 30. Patients' mother is also living at age 46. [place] notes, however, that she was raised by her grandmother.) The patient has 0 brothers and 5 sisters. Patient denies anyone in her family having ovarian, prostate, or pancreatic cancer. Her maternal grandmother was diagnosed with breast cancer, unsure what age. On her father's side, she reports her grandmother had cysts in her breasts, her great-aunt might have had breast cancer, and her great-grandmother was diagnosed with breast cancer at an older age.   GYNECOLOGIC HISTORY:  No LMP recorded. (Menstrual status: Chemotherapy).  She had an emergent C-section in 09/23/2018 for abruptio placenta at 30 weeks pregnancy, also has a history of eclampsia Menarche:    years old Age at first live birth: 34 years old [place] P: 3 LMP: 04/18/2019 Contraceptive: none HRT: none  Hysterectomy?: no BSO?: no   SOCIAL HISTORY: (Current as of 04/25/2019) Terri Estes is currently unemployed. She previously worked at [employer]. She is currently engaged. Fiance  [name] runs a Midwife and works other odd jobs. Daughter Demetrius Charity, age 37, was abused by her father at 59 months and has cerebral palsy. Son [name], age 26, has severe asthma and is developmentally disabled. Daughter Yetta Glassman was born on 09/23/2018 prematurely and remains on oxygen at home.    ADVANCED DIRECTIVES: not in place.    HEALTH MAINTENANCE: Social History   Tobacco Use   Smoking status: Former Smoker    Packs/day: 0.50    Years: 5.00    Pack years: 2.50    Types: Cigarettes    Quit date: 04/10/2019    Years since quitting: 0.1   Smokeless  tobacco: Never Used   Tobacco comment: trying to quit  Substance Use Topics   Alcohol use: No   Drug use: Yes    Frequency: 7.0 times per week    Types: Marijuana    Comment: subutex    Colonoscopy: never done  PAP: 05/10/2018  Bone density: never done Mammography: first performed following abnormal CT  No Known Allergies  Current Outpatient Medications  Medication Sig Dispense Refill   albuterol (PROVENTIL HFA;VENTOLIN HFA) 108 (90 Base) MCG/ACT inhaler Inhale 2 puffs into the lungs 4 (four) times daily. 1 Inhaler 2   buprenorphine (SUBUTEX) 2 MG SUBL SL tablet Place 4 mg under the tongue at bedtime.     buprenorphine (SUBUTEX) 8 MG SUBL SL tablet Place 8 mg under the tongue daily.      cholestyramine (QUESTRAN) 4 g packet Take 1 packet (4 g total) by mouth 3 (three) times daily with meals. 60 each 12   cyclobenzaprine (FLEXERIL) 10 MG tablet Take 1 tablet (10 mg total) by mouth 3 (three) times daily as needed for muscle spasms. 90 tablet 2   dexamethasone (DECADRON) 4 MG tablet Take 4 mg by mouth daily. Take 2 tablets by mouth daily on the day after chemotherapy, and then take 2 tablets twice daily for 2 days afterwards.  Take with food.     enalapril (VASOTEC) 10 MG tablet TAKE 1 TABLET BY MOUTH DAILY 90 tablet 0   gabapentin (NEURONTIN) 300 MG capsule Take 1 capsule (300 mg total) by mouth 3 (three) times daily. 90 capsule 0   omeprazole (PRILOSEC) 40 MG capsule Take 1 capsule (40 mg total) by mouth daily. 30 capsule 5   ondansetron (ZOFRAN) 8 MG tablet TAKE 1 TABLET(8 MG) BY MOUTH EVERY 8 HOURS AS NEEDED FOR NAUSEA OR VOMITING 20 tablet 0   PARoxetine (PAXIL) 10 MG tablet Take 1 tablet (10 mg total) by mouth daily. (Patient taking differently: Take 10 mg by mouth daily. Patient is taking 2 tablets daily per MD) 30 tablet 5   Prenatal MV-Min-FA-Omega-3 (PRENATAL GUMMIES/DHA & FA) 0.4-32.5 MG CHEW Chew 2 each by mouth daily.     No current facility-administered  medications for this visit.    Facility-Administered Medications Ordered in Other Visits  Medication Dose Route Frequency Provider Last Rate Last Dose   0.9 % NaCl with KCl 20 mEq/ L  infusion   Intravenous Once Keary Waterson, [name], MD       sodium chloride flush (NS) 0.9 % injection 10 mL  10 mL Intracatheter PRN Brigette Hopfer, [name], MD         OBJECTIVE: Young African-American woman who appears stated age  Vitals:   06/19/19 1203  BP: 97/67  Pulse: (!) 101  Resp: 18  Temp: 98.7 F (37.1 C)  SpO2: 100%     Body mass  index is 38.81 kg/m.   Wt Readings from Last 3 Encounters:  06/19/19 198 lb 11.2 oz (90.1 kg)  06/05/19 194 lb 9.6 oz (88.3 kg)  05/23/19 193 lb 6.4 oz (87.7 kg)      ECOG FS:1 - Symptomatic but completely ambulatory  Sclerae unicteric, EOMs intact Oropharynx clear and moist No cervical or supraclavicular adenopathy Lungs no rales or rhonchi Heart regular rate and rhythm Abd soft, nontender, positive bowel sounds MSK no focal spinal tenderness, no upper extremity lymphedema Neuro: nonfocal, well oriented, appropriate affect Breasts: The right breast is imaged below.  It is smaller than at baseline.  The skin lesions are pale dry and scaly.  There is an area in the inferior and medial aspect of the areola which is slightly firmer than the rest and may indicate fluid or scar tissue  Right breast 06/19/2019      LAB RESULTS:  CMP     Component Value Date/Time   NA 140 06/12/2019 0917   NA 137 05/10/2018 1535   K 3.6 06/12/2019 0917   CL 102 06/12/2019 0917   CO2 28 06/12/2019 0917   GLUCOSE 85 06/12/2019 0917   BUN 14 06/12/2019 0917   BUN 7 05/10/2018 1535   CREATININE 0.86 06/12/2019 0917   CREATININE 0.78 05/11/2019 1305   CALCIUM 8.3 (L) 06/12/2019 0917   PROT 6.3 (L) 06/12/2019 0917   PROT 7.2 05/10/2018 1535   ALBUMIN 3.4 (L) 06/12/2019 0917   ALBUMIN 4.2 05/10/2018 1535   AST 78 (H) 06/12/2019 0917   AST 20 05/11/2019 1305   ALT 117  (H) 06/12/2019 0917   ALT 44 05/11/2019 1305   ALKPHOS 96 06/12/2019 0917   BILITOT <0.2 (L) 06/12/2019 0917   BILITOT 0.4 05/11/2019 1305   GFRNONAA >60 06/12/2019 0917   GFRNONAA >60 05/11/2019 1305   GFRAA >60 06/12/2019 0917   GFRAA >60 05/11/2019 1305    No results found for: TOTALPROTELP, ALBUMINELP, A1GS, A2GS, BETS, BETA2SER, GAMS, MSPIKE, SPEI  No results found for: KPAFRELGTCHN, LAMBDASER, KAPLAMBRATIO  Lab Results  Component Value Date   WBC 6.3 06/19/2019   NEUTROABS 3.4 06/19/2019   HGB 9.8 (L) 06/19/2019   HCT 30.1 (L) 06/19/2019   MCV 94.1 06/19/2019   PLT 257 06/19/2019    _0 @  No results found for: LABCA2  No components found for: QHUTML465  No results for input(s): INR in the last 168 hours.  No results found for: LABCA2  No results found for: KPT465  No results found for: KCL275  No results found for: TZG017  No results found for: CA2729  No components found for: HGQUANT  No results found for: CEA1 / No results found for: CEA1   No results found for: AFPTUMOR  No results found for: Wilmer  No results found for: PSA1  Appointment on 06/19/2019  Component Date Value Ref Range Status   WBC 06/19/2019 6.3  4.0 - 10.5 K/uL Final   RBC 06/19/2019 3.20* 3.87 - 5.11 MIL/uL Final   Hemoglobin 06/19/2019 9.8* 12.0 - 15.0 g/dL Final   HCT 06/19/2019 30.1* 36.0 - 46.0 % Final   MCV 06/19/2019 94.1  80.0 - 100.0 fL Final   MCH 06/19/2019 30.6  26.0 - 34.0 pg Final   MCHC 06/19/2019 32.6  30.0 - 36.0 g/dL Final   RDW 06/19/2019 16.5* 11.5 - 15.5 % Final   Platelets 06/19/2019 257  150 - 400 K/uL Final   nRBC 06/19/2019 0.0  0.0 - 0.2 %  Final   Neutrophils Relative % 06/19/2019 55  % Final   Neutro Abs 06/19/2019 3.4  1.7 - 7.7 K/uL Final   Lymphocytes Relative 06/19/2019 39  % Final   Lymphs Abs 06/19/2019 2.5  0.7 - 4.0 K/uL Final   Monocytes Relative 06/19/2019 5  % Final   Monocytes Absolute 06/19/2019 0.3   0.1 - 1.0 K/uL Final   Eosinophils Relative 06/19/2019 0  % Final   Eosinophils Absolute 06/19/2019 0.0  0.0 - 0.5 K/uL Final   Basophils Relative 06/19/2019 0  % Final   Basophils Absolute 06/19/2019 0.0  0.0 - 0.1 K/uL Final   Immature Granulocytes 06/19/2019 1  % Final   Abs Immature Granulocytes 06/19/2019 0.04  0.00 - 0.07 K/uL Final   Performed at [facility], [address] [place], [place] [address]    (this displays the last labs from the last 3 days)  No results found for: TOTALPROTELP, ALBUMINELP, A1GS, A2GS, BETS, BETA2SER, GAMS, MSPIKE, SPEI (this displays SPEP labs)  No results found for: KPAFRELGTCHN, LAMBDASER, KAPLAMBRATIO (kappa/lambda light chains)  Lab Results  Component Value Date   HGBA 97.4 12/16/2016   (Hemoglobinopathy evaluation)   No results found for: LDH  No results found for: IRON, TIBC, IRONPCTSAT (Iron and TIBC)  No results found for: FERRITIN  Urinalysis    Component Value Date/Time   COLORURINE COLORLESS (A) 08/20/2018 0704   APPEARANCEUR CLEAR 08/20/2018 0704   LABSPEC 1.003 (L) 08/20/2018 0704   PHURINE 7.0 08/20/2018 0704   GLUCOSEU NEGATIVE 08/20/2018 0704   HGBUR NEGATIVE 08/20/2018 0704   BILIRUBINUR NEGATIVE 08/20/2018 0704   KETONESUR NEGATIVE 08/20/2018 0704   PROTEINUR NEGATIVE 08/20/2018 0704   UROBILINOGEN 4.0 (H) 01/13/2018 1417   NITRITE NEGATIVE 08/20/2018 0704   LEUKOCYTESUR NEGATIVE 08/20/2018 0704     STUDIES:  No results found.   ELIGIBLE FOR AVAILABLE RESEARCH PROTOCOL: no   ASSESSMENT: 33 y.o. [place] woman status post right breast biopsy x2 and right axillary lymph node biopsy 04/19/2019 for a clinical T4 N2 MX, stage IIIC invasive ductal carcinoma, grade 3, triple negative, with an MIB-1 of 90%  (a) chest CT scan 04/11/2019 shows multiple subcentimeter lung nodules  (b) PET scan denied by insurance, CT A/P and bone scan due 05/09/2019  (c) baseline breast MRI  04/30/2019 shows involvement of all 4 quadrants in the right breast as well as right axillary subpectoral and internal mammary lymphadenopathy.  Left breast was unremarkable  (1) neoadjuvant chemotherapy consisting of  (a) pembrolizumab started 05/03/2019, held after first dose because of elevated liver function tests  (b) paclitaxel weekly x12 with carboplatin every 21 days x 4, started 05/08/2019   (i) tolerated carboplatin AUC 5-day 1 poorly, switched to AUC 2 on day 1 day 8   (ii) cycle 2 delayed 1 week because of elevated liver function tests  (c) cyclophosphamide and doxorubicin in dose dense fashion x4 to follow   (2) definitive surgery pending  (3) adjuvant radiation to follow  (4) genetics testing  (a) genetic testing 05/14/2019 through the Common Hereditary Gene Panel offered by [facility] found no deleterious mutations in APC, ATM, AXIN2, BARD1, BMPR1A, BRCA1, BRCA2, BRIP1, CDH1, CDK4, CDKN2A (p14ARF), CDKN2A (p16INK4a), CHEK2, CTNNA1, DICER1, EPCAM (Deletion/duplication testing only), GREM1 (promoter region deletion/duplication testing only), KIT, MEN1, MLH1, MSH2, MSH3, MSH6, MUTYH, NBN, NF1, NHTL1, PALB2, PDGFRA, PMS2, POLD1, POLE, PTEN, RAD50, RAD51C, RAD51D, RNF43, SDHB, SDHC, SDHD, SMAD4, SMARCA4. STK11, TP53, TSC1, TSC2, and VHL.  The following  genes were evaluated for sequence changes only: SDHA and HOXB13 c.251G>A variant only  (5) goserelin started 05/08/2019   PLAN:  Terri Estes is working hard to get through her chemotherapy.  She has some mouth sores for which I will prescribe Valtrex and some urinary tract infection symptoms which I will treat with Cipro.  Hopefully if these antibiotics will help both these issues but if not she will let us know next week.  She is becoming moderately anemic.  We will have to follow counts every week and if they drop significantly she may have a week "off".  She is concerned that the cancer may be growing.  The right breast seems to me  overall considerably better than at baseline.  I am going to repeat an ultrasound of the right breast just to take an interim look and confirm  She is trying very hard to find new housing.  I greatly appreciate the help she is receiving from our social work and chaplain services  We are proceeding with treatment today.  She will see [place] again next week and every week during her treatment until she is through the section, after which she will go on to doxorubicin and cyclophosphamide every 2 weeks.  She knows to call for any other issues that may develop before the next visit.  I spent approximately 30 minutes face to face with Terri Estes with more than 50% of that time spent in counseling and coordination of care.    Jaleiyah Alas, [name], MD  06/19/19 12:29 PM Medical Oncology and Hematology [facility] [place] [address] [place], [place] [address] Tel. [phone]    Fax. [phone]   I, Jacqualyn Posey am acting as a Education administrator for Chauncey Cruel, MD.   I, Lurline Del MD, have reviewed the above documentation for accuracy and completeness, and I agree with the above.

## 2019-06-19 ENCOUNTER — Inpatient Hospital Stay (HOSPITAL_BASED_OUTPATIENT_CLINIC_OR_DEPARTMENT_OTHER): Payer: Medicaid Other | Admitting: Oncology

## 2019-06-19 ENCOUNTER — Inpatient Hospital Stay: Payer: Medicaid Other

## 2019-06-19 ENCOUNTER — Encounter: Payer: Self-pay | Admitting: *Deleted

## 2019-06-19 ENCOUNTER — Encounter: Payer: Self-pay | Admitting: General Practice

## 2019-06-19 ENCOUNTER — Other Ambulatory Visit: Payer: Self-pay

## 2019-06-19 VITALS — BP 95/72 | HR 89 | Resp 16

## 2019-06-19 VITALS — BP 97/67 | HR 101 | Temp 98.7°F | Resp 18 | Ht 60.0 in | Wt 198.7 lb

## 2019-06-19 DIAGNOSIS — Z171 Estrogen receptor negative status [ER-]: Secondary | ICD-10-CM

## 2019-06-19 DIAGNOSIS — C773 Secondary and unspecified malignant neoplasm of axilla and upper limb lymph nodes: Secondary | ICD-10-CM | POA: Diagnosis not present

## 2019-06-19 DIAGNOSIS — D649 Anemia, unspecified: Secondary | ICD-10-CM

## 2019-06-19 DIAGNOSIS — Z658 Other specified problems related to psychosocial circumstances: Secondary | ICD-10-CM

## 2019-06-19 DIAGNOSIS — C50811 Malignant neoplasm of overlapping sites of right female breast: Secondary | ICD-10-CM

## 2019-06-19 DIAGNOSIS — Z5111 Encounter for antineoplastic chemotherapy: Secondary | ICD-10-CM | POA: Diagnosis not present

## 2019-06-19 DIAGNOSIS — K1379 Other lesions of oral mucosa: Secondary | ICD-10-CM | POA: Diagnosis not present

## 2019-06-19 LAB — CBC WITH DIFFERENTIAL/PLATELET
Abs Immature Granulocytes: 0.04 10*3/uL (ref 0.00–0.07)
Basophils Absolute: 0 10*3/uL (ref 0.0–0.1)
Basophils Relative: 0 %
Eosinophils Absolute: 0 10*3/uL (ref 0.0–0.5)
Eosinophils Relative: 0 %
HCT: 30.1 % — ABNORMAL LOW (ref 36.0–46.0)
Hemoglobin: 9.8 g/dL — ABNORMAL LOW (ref 12.0–15.0)
Immature Granulocytes: 1 %
Lymphocytes Relative: 39 %
Lymphs Abs: 2.5 10*3/uL (ref 0.7–4.0)
MCH: 30.6 pg (ref 26.0–34.0)
MCHC: 32.6 g/dL (ref 30.0–36.0)
MCV: 94.1 fL (ref 80.0–100.0)
Monocytes Absolute: 0.3 10*3/uL (ref 0.1–1.0)
Monocytes Relative: 5 %
Neutro Abs: 3.4 10*3/uL (ref 1.7–7.7)
Neutrophils Relative %: 55 %
Platelets: 257 10*3/uL (ref 150–400)
RBC: 3.2 MIL/uL — ABNORMAL LOW (ref 3.87–5.11)
RDW: 16.5 % — ABNORMAL HIGH (ref 11.5–15.5)
WBC: 6.3 10*3/uL (ref 4.0–10.5)
nRBC: 0 % (ref 0.0–0.2)

## 2019-06-19 LAB — COMPREHENSIVE METABOLIC PANEL
ALT: 59 U/L — ABNORMAL HIGH (ref 0–44)
AST: 36 U/L (ref 15–41)
Albumin: 3.4 g/dL — ABNORMAL LOW (ref 3.5–5.0)
Alkaline Phosphatase: 78 U/L (ref 38–126)
Anion gap: 8 (ref 5–15)
BUN: 13 mg/dL (ref 6–20)
CO2: 27 mmol/L (ref 22–32)
Calcium: 8.8 mg/dL — ABNORMAL LOW (ref 8.9–10.3)
Chloride: 105 mmol/L (ref 98–111)
Creatinine, Ser: 0.85 mg/dL (ref 0.44–1.00)
GFR calc Af Amer: >60 mL/min (ref >60)
GFR calc non Af Amer: >60 mL/min (ref >60)
Glucose, Bld: 93 mg/dL (ref 70–99)
Potassium: 4 mmol/L (ref 3.5–5.1)
Sodium: 140 mmol/L (ref 135–145)
Total Bilirubin: <0.2 mg/dL — ABNORMAL LOW (ref 0.3–1.2)
Total Protein: 6.4 g/dL — ABNORMAL LOW (ref 6.5–8.1)

## 2019-06-19 MED ORDER — HEPARIN SOD (PORK) LOCK FLUSH 100 UNIT/ML IV SOLN
500.0000 [IU] | Freq: Once | INTRAVENOUS | Status: AC | PRN
  Administered 2019-06-19: 500 [IU]
  Filled 2019-06-19: qty 5

## 2019-06-19 MED ORDER — DIPHENHYDRAMINE HCL 50 MG/ML IJ SOLN
25.0000 mg | Freq: Once | INTRAMUSCULAR | Status: AC
  Administered 2019-06-19: 14:00:00 25 mg via INTRAVENOUS

## 2019-06-19 MED ORDER — DIPHENHYDRAMINE HCL 50 MG/ML IJ SOLN
INTRAMUSCULAR | Status: AC
  Filled 2019-06-19: qty 1

## 2019-06-19 MED ORDER — SODIUM CHLORIDE 0.9 % IV SOLN
300.0000 mg | Freq: Once | INTRAVENOUS | Status: AC
  Administered 2019-06-19: 300 mg via INTRAVENOUS
  Filled 2019-06-19: qty 30

## 2019-06-19 MED ORDER — FAMOTIDINE IN NACL 20-0.9 MG/50ML-% IV SOLN
20.0000 mg | Freq: Once | INTRAVENOUS | Status: AC
  Administered 2019-06-19: 20 mg via INTRAVENOUS

## 2019-06-19 MED ORDER — SODIUM CHLORIDE 0.9% FLUSH
10.0000 mL | INTRAVENOUS | Status: DC | PRN
  Administered 2019-06-19: 10 mL
  Filled 2019-06-19: qty 10

## 2019-06-19 MED ORDER — SODIUM CHLORIDE 0.9 % IV SOLN
Freq: Once | INTRAVENOUS | Status: AC
  Administered 2019-06-19: 14:00:00 via INTRAVENOUS
  Filled 2019-06-19: qty 5

## 2019-06-19 MED ORDER — PALONOSETRON HCL INJECTION 0.25 MG/5ML
0.2500 mg | Freq: Once | INTRAVENOUS | Status: AC
  Administered 2019-06-19: 0.25 mg via INTRAVENOUS

## 2019-06-19 MED ORDER — SODIUM CHLORIDE 0.9 % IV SOLN
Freq: Once | INTRAVENOUS | Status: AC
  Administered 2019-06-19: 15:00:00 via INTRAVENOUS
  Filled 2019-06-19: qty 250

## 2019-06-19 MED ORDER — CIPROFLOXACIN HCL 500 MG PO TABS: 500.0000 mg | ORAL_TABLET | Freq: Two times a day (BID) | ORAL | 0 refills | Status: DC

## 2019-06-19 MED ORDER — FAMOTIDINE IN NACL 20-0.9 MG/50ML-% IV SOLN
INTRAVENOUS | Status: AC
  Filled 2019-06-19: qty 50

## 2019-06-19 MED ORDER — PALONOSETRON HCL INJECTION 0.25 MG/5ML
INTRAVENOUS | Status: AC
  Filled 2019-06-19: qty 5

## 2019-06-19 MED ORDER — VALACYCLOVIR HCL 1 G PO TABS: 1000.0000 mg | ORAL_TABLET | Freq: Every day | ORAL | 0 refills | Status: DC

## 2019-06-19 MED ORDER — SODIUM CHLORIDE 0.9 % IV SOLN
80.0000 mg/m2 | Freq: Once | INTRAVENOUS | Status: AC
  Administered 2019-06-19: 162 mg via INTRAVENOUS
  Filled 2019-06-19: qty 27

## 2019-06-19 NOTE — Progress Notes (Signed)
[place] CSW Progress Notes  Message from [facility], they have not received signed paperwork from patient needed to begin her disability application.  Spoke w patient who is in chemo today.  States "I don't have tape to seal the envelope so I cant mail it."  Does not have paperwork with her.  Asked CSW [place] onsite to bring patient larger envelope to mail paperwork - pt states [employer] provided stamps for paperwork.  [name], LCSW Clinical Social Worker Phone:  [phone]

## 2019-06-19 NOTE — Patient Instructions (Signed)
[facility] Discharge Instructions for Patients Receiving Chemotherapy  Today you received the following chemotherapy agents:  Taxol, Carboplatin  To help prevent nausea and vomiting after your treatment, we encourage you to take your nausea medication as prescribed.   If you develop nausea and vomiting that is not controlled by your nausea medication, call the clinic.   BELOW ARE SYMPTOMS THAT SHOULD BE REPORTED IMMEDIATELY:  *FEVER GREATER THAN 100.5 F  *CHILLS WITH OR WITHOUT FEVER  NAUSEA AND VOMITING THAT IS NOT CONTROLLED WITH YOUR NAUSEA MEDICATION  *UNUSUAL SHORTNESS OF BREATH  *UNUSUAL BRUISING OR BLEEDING  TENDERNESS IN MOUTH AND THROAT WITH OR WITHOUT PRESENCE OF ULCERS  *URINARY PROBLEMS  *BOWEL PROBLEMS  UNUSUAL RASH Items with * indicate a potential emergency and should be followed up as soon as possible.  Feel free to call the clinic should you have any questions or concerns. The clinic phone number is [phone].  Please show the CHEMO ALERT CARD at check-in to the Emergency Department and triage nurse.   

## 2019-06-20 ENCOUNTER — Encounter: Payer: Self-pay | Admitting: General Practice

## 2019-06-20 NOTE — Progress Notes (Signed)
[facility] Spiritual Care Note  This morning's call was at an inconvenient time for [name]. Tried this pm as planned, but had to LVM, encouraging callback.   [place], [place], [facility] Pager [phone] Voicemail [phone]

## 2019-06-25 ENCOUNTER — Encounter: Payer: Self-pay | Admitting: *Deleted

## 2019-06-25 ENCOUNTER — Other Ambulatory Visit: Payer: Self-pay | Admitting: Oncology

## 2019-06-25 DIAGNOSIS — C50811 Malignant neoplasm of overlapping sites of right female breast: Secondary | ICD-10-CM

## 2019-06-25 DIAGNOSIS — Z171 Estrogen receptor negative status [ER-]: Secondary | ICD-10-CM

## 2019-06-25 NOTE — Progress Notes (Signed)
[place] Work  Holiday representative attempted to contact patient to follow up on housing concerns and offer additional support.  CSW left patient a message and encouraged her to call back with needs or concerns.    Johnnye Lana, MSW, LCSW, OSW-C Clinical Social Worker [facility] [phone]

## 2019-06-26 ENCOUNTER — Other Ambulatory Visit: Payer: Self-pay

## 2019-06-26 ENCOUNTER — Inpatient Hospital Stay: Payer: Medicaid Other

## 2019-06-26 ENCOUNTER — Inpatient Hospital Stay (HOSPITAL_BASED_OUTPATIENT_CLINIC_OR_DEPARTMENT_OTHER): Payer: Medicaid Other | Admitting: Adult Health

## 2019-06-26 VITALS — BP 103/71 | HR 107 | Temp 98.3°F | Resp 19 | Ht 60.0 in | Wt 208.6 lb

## 2019-06-26 VITALS — HR 97

## 2019-06-26 DIAGNOSIS — R300 Dysuria: Secondary | ICD-10-CM

## 2019-06-26 DIAGNOSIS — N369 Urethral disorder, unspecified: Secondary | ICD-10-CM

## 2019-06-26 DIAGNOSIS — C50811 Malignant neoplasm of overlapping sites of right female breast: Secondary | ICD-10-CM

## 2019-06-26 DIAGNOSIS — Z5111 Encounter for antineoplastic chemotherapy: Secondary | ICD-10-CM | POA: Diagnosis not present

## 2019-06-26 DIAGNOSIS — Z171 Estrogen receptor negative status [ER-]: Secondary | ICD-10-CM

## 2019-06-26 DIAGNOSIS — C773 Secondary and unspecified malignant neoplasm of axilla and upper limb lymph nodes: Secondary | ICD-10-CM

## 2019-06-26 LAB — CBC WITH DIFFERENTIAL/PLATELET
Abs Immature Granulocytes: 0.1 10*3/uL — ABNORMAL HIGH (ref 0.00–0.07)
Basophils Absolute: 0 10*3/uL (ref 0.0–0.1)
Basophils Relative: 0 %
Eosinophils Absolute: 0 10*3/uL (ref 0.0–0.5)
Eosinophils Relative: 0 %
HCT: 29.2 % — ABNORMAL LOW (ref 36.0–46.0)
Hemoglobin: 9.5 g/dL — ABNORMAL LOW (ref 12.0–15.0)
Immature Granulocytes: 1 %
Lymphocytes Relative: 49 %
Lymphs Abs: 3.9 10*3/uL (ref 0.7–4.0)
MCH: 31.6 pg (ref 26.0–34.0)
MCHC: 32.5 g/dL (ref 30.0–36.0)
MCV: 97 fL (ref 80.0–100.0)
Monocytes Absolute: 0.3 10*3/uL (ref 0.1–1.0)
Monocytes Relative: 3 %
Neutro Abs: 3.8 10*3/uL (ref 1.7–7.7)
Neutrophils Relative %: 47 %
Platelets: 203 10*3/uL (ref 150–400)
RBC: 3.01 MIL/uL — ABNORMAL LOW (ref 3.87–5.11)
RDW: 17.2 % — ABNORMAL HIGH (ref 11.5–15.5)
WBC: 8.1 10*3/uL (ref 4.0–10.5)
nRBC: 0 % (ref 0.0–0.2)

## 2019-06-26 LAB — URINALYSIS, COMPLETE (UACMP) WITH MICROSCOPIC
Bilirubin Urine: NEGATIVE
Glucose, UA: NEGATIVE mg/dL
Hgb urine dipstick: NEGATIVE
Ketones, ur: NEGATIVE mg/dL
Nitrite: NEGATIVE
Protein, ur: NEGATIVE mg/dL
Specific Gravity, Urine: 1.019 (ref 1.005–1.030)
pH: 6 (ref 5.0–8.0)

## 2019-06-26 LAB — COMPREHENSIVE METABOLIC PANEL
ALT: 64 U/L — ABNORMAL HIGH (ref 0–44)
AST: 29 U/L (ref 15–41)
Albumin: 3.2 g/dL — ABNORMAL LOW (ref 3.5–5.0)
Alkaline Phosphatase: 72 U/L (ref 38–126)
Anion gap: 9 (ref 5–15)
BUN: 19 mg/dL (ref 6–20)
CO2: 27 mmol/L (ref 22–32)
Calcium: 8 mg/dL — ABNORMAL LOW (ref 8.9–10.3)
Chloride: 102 mmol/L (ref 98–111)
Creatinine, Ser: 0.85 mg/dL (ref 0.44–1.00)
GFR calc Af Amer: >60 mL/min (ref >60)
GFR calc non Af Amer: >60 mL/min (ref >60)
Glucose, Bld: 91 mg/dL (ref 70–99)
Potassium: 3.5 mmol/L (ref 3.5–5.1)
Sodium: 138 mmol/L (ref 135–145)
Total Bilirubin: <0.2 mg/dL — ABNORMAL LOW (ref 0.3–1.2)
Total Protein: 6.1 g/dL — ABNORMAL LOW (ref 6.5–8.1)

## 2019-06-26 MED ORDER — SODIUM CHLORIDE 0.9 % IV SOLN
80.0000 mg/m2 | Freq: Once | INTRAVENOUS | Status: AC
  Administered 2019-06-26: 15:00:00 162 mg via INTRAVENOUS
  Filled 2019-06-26: qty 27

## 2019-06-26 MED ORDER — PALONOSETRON HCL INJECTION 0.25 MG/5ML
0.2500 mg | Freq: Once | INTRAVENOUS | Status: AC
  Administered 2019-06-26: 0.25 mg via INTRAVENOUS

## 2019-06-26 MED ORDER — SODIUM CHLORIDE 0.9% FLUSH
10.0000 mL | INTRAVENOUS | Status: DC | PRN
  Administered 2019-06-26: 10 mL
  Filled 2019-06-26: qty 10

## 2019-06-26 MED ORDER — SODIUM CHLORIDE 0.9 % IV SOLN
Freq: Once | INTRAVENOUS | Status: AC
  Administered 2019-06-26: 14:00:00 via INTRAVENOUS
  Filled 2019-06-26: qty 5

## 2019-06-26 MED ORDER — FAMOTIDINE IN NACL 20-0.9 MG/50ML-% IV SOLN
INTRAVENOUS | Status: AC
  Filled 2019-06-26: qty 50

## 2019-06-26 MED ORDER — ACYCLOVIR-HYDROCORTISONE 5-1 % EX CREA: 1.0000 "application " | TOPICAL_CREAM | Freq: Two times a day (BID) | CUTANEOUS | 0 refills | Status: DC | PRN

## 2019-06-26 MED ORDER — SODIUM CHLORIDE 0.9 % IV SOLN
Freq: Once | INTRAVENOUS | Status: AC
  Administered 2019-06-26: 14:00:00 via INTRAVENOUS
  Filled 2019-06-26: qty 250

## 2019-06-26 MED ORDER — SODIUM CHLORIDE 0.9 % IV SOLN
300.0000 mg | Freq: Once | INTRAVENOUS | Status: AC
  Administered 2019-06-26: 300 mg via INTRAVENOUS
  Filled 2019-06-26: qty 30

## 2019-06-26 MED ORDER — PALONOSETRON HCL INJECTION 0.25 MG/5ML
INTRAVENOUS | Status: AC
  Filled 2019-06-26: qty 5

## 2019-06-26 MED ORDER — FAMOTIDINE IN NACL 20-0.9 MG/50ML-% IV SOLN
20.0000 mg | Freq: Once | INTRAVENOUS | Status: AC
  Administered 2019-06-26: 20 mg via INTRAVENOUS

## 2019-06-26 MED ORDER — VALACYCLOVIR HCL 1 G PO TABS: 1000.0000 mg | ORAL_TABLET | Freq: Two times a day (BID) | ORAL | 0 refills | Status: AC

## 2019-06-26 MED ORDER — NITROFURANTOIN MONOHYD MACRO 100 MG PO CAPS: 100.0000 mg | ORAL_CAPSULE | Freq: Two times a day (BID) | ORAL | 0 refills | Status: DC

## 2019-06-26 MED ORDER — DIPHENHYDRAMINE HCL 50 MG/ML IJ SOLN
INTRAMUSCULAR | Status: AC
  Filled 2019-06-26: qty 1

## 2019-06-26 MED ORDER — DIPHENHYDRAMINE HCL 50 MG/ML IJ SOLN
25.0000 mg | Freq: Once | INTRAMUSCULAR | Status: AC
  Administered 2019-06-26: 25 mg via INTRAVENOUS

## 2019-06-26 MED ORDER — HEPARIN SOD (PORK) LOCK FLUSH 100 UNIT/ML IV SOLN
500.0000 [IU] | Freq: Once | INTRAVENOUS | Status: AC | PRN
  Administered 2019-06-26: 17:00:00 500 [IU]
  Filled 2019-06-26: qty 5

## 2019-06-26 NOTE — Patient Instructions (Signed)
Coronavirus (COVID-19) Are you at risk?  Are you at risk for the Coronavirus (COVID-19)?  To be considered HIGH RISK for Coronavirus (COVID-19), you have to meet the following criteria:  . Traveled to [place], [place], [place], [place] or [place]; or in the [place] to [place], [place], [place], or [place]; and have fever, cough, and shortness of breath within the last 2 weeks of travel OR . Been in close contact with a person diagnosed with COVID-19 within the last 2 weeks and have fever, cough, and shortness of breath . IF YOU DO NOT MEET THESE CRITERIA, YOU ARE CONSIDERED LOW RISK FOR COVID-19.  What to do if you are HIGH RISK for COVID-19?  . If you are having a medical emergency, call 911. . Seek medical care right away. Before you go to a doctor's office, urgent care or emergency department, call ahead and tell them about your recent travel, contact with someone diagnosed with COVID-19, and your symptoms. You should receive instructions from your physician's office regarding next steps of care.  . When you arrive at healthcare provider, tell the healthcare staff immediately you have returned from visiting [place], [place], [place], [place] or [place]; or traveled in the [place] to [place], [place], [place], or [place]; in the last two weeks or you have been in close contact with a person diagnosed with COVID-19 in the last 2 weeks.   . Tell the health care staff about your symptoms: fever, cough and shortness of breath. . After you have been seen by a medical provider, you will be either: o Tested for (COVID-19) and discharged home on quarantine except to seek medical care if symptoms worsen, and asked to  - Stay home and avoid contact with others until you get your results (4-5 days)  - Avoid travel on public transportation if possible (such as bus, train, or airplane) or o Sent to the Emergency Department by EMS for evaluation, COVID-19 testing, and possible  admission depending on your condition and test results.  What to do if you are LOW RISK for COVID-19?  Reduce your risk of any infection by using the same precautions used for avoiding the common cold or flu:  . Wash your hands often with soap and warm water for at least 20 seconds.  If soap and water are not readily available, use an alcohol-based hand sanitizer with at least 60% alcohol.  . If coughing or sneezing, cover your mouth and nose by coughing or sneezing into the elbow areas of your shirt or coat, into a tissue or into your sleeve (not your hands). . Avoid shaking hands with others and consider head nods or verbal greetings only. . Avoid touching your eyes, nose, or mouth with unwashed hands.  . Avoid close contact with people who are sick. . Avoid places or events with large numbers of people in one location, like concerts or sporting events. . Carefully consider travel plans you have or are making. . If you are planning any travel outside or inside the US, visit the CDC's Travelers' Health webpage for the latest health notices. . If you have some symptoms but not all symptoms, continue to monitor at home and seek medical attention if your symptoms worsen. . If you are having a medical emergency, call 911.   ADDITIONAL HEALTHCARE OPTIONS FOR PATIENTS   Telehealth / e-Visit: [email or web address]         [facility]: [phone]  [facility]: [phone]                   [facility]: [phone]    Cancer Center Discharge Instructions for Patients Receiving Chemotherapy  Today you received the following chemotherapy agents Taxol and Carboplatin   To help prevent nausea and vomiting after your treatment, we encourage you to take your nausea medication as directed.    If you develop nausea and vomiting that is not controlled by your nausea medication, call the clinic.    BELOW ARE SYMPTOMS THAT SHOULD BE REPORTED IMMEDIATELY:  *FEVER GREATER THAN 100.5 F  *CHILLS WITH OR WITHOUT FEVER  NAUSEA AND VOMITING THAT IS NOT CONTROLLED WITH YOUR NAUSEA MEDICATION  *UNUSUAL SHORTNESS OF BREATH  *UNUSUAL BRUISING OR BLEEDING  TENDERNESS IN MOUTH AND THROAT WITH OR WITHOUT PRESENCE OF ULCERS  *URINARY PROBLEMS  *BOWEL PROBLEMS  UNUSUAL RASH Items with * indicate a potential emergency and should be followed up as soon as possible.  Feel free to call the clinic should you have any questions or concerns. The clinic phone number is [phone].  Please show the CHEMO ALERT CARD at check-in to the Emergency Department and triage nurse.   

## 2019-06-26 NOTE — Progress Notes (Signed)
[facility]  Telephone:(336) [phone] Fax:(336) [phone]    ID: Terri Estes DOB: [birth year]  MR#: [id number]  JEH#:[id number]  Patient Care Team: Patient, No Pcp Per as PCP - General (General Practice) Alphonsa Overall, MD as Consulting Physician (General Surgery) Magrinat, [name], MD as Consulting Physician (Oncology) Kyung Rudd, MD as Consulting Physician (Radiation Oncology) Rockwell Germany, RN as Oncology Nurse Navigator Mauro Kaufmann, RN as Oncology Nurse Navigator Donnamae Jude, MD as Consulting Physician (Obstetrics and Gynecology) Pavelock, Ralene Bathe, MD as Consulting Physician (Internal Medicine) OTHER MD: Joylene Igo for [facility] is [phone]   CHIEF COMPLAINT: Triple negative breast cancer  CURRENT TREATMENT: Neoadjuvant chemotherapy[/immunotherapy]/goserelin   INTERVAL HISTORY: Terri Estes returns today for follow-up and treatment of her estrogen negative breast cancer.  She continues on neoadjuvant chemotherapy consisting of paclitaxel and carboplatin weekly x12. Today is day 1 cycle 5. She has continued dysuria and thinks she may have seen a perineal blister recently.  The pain is worsening.  She has completed cipro for this and it hasn't helped.    She is also receiving goserelin every 28 days, with her most recent dose 06/05/2019.  REVIEW OF SYSTEMS: Terri Estes is doing well today.  She denies peripheral neuropathy.  She denies any new issues such as numbness and tingling in the fingertips and toes.  She denies any chest pain, palpitations, cough, or shortness of breath.  She denies any nausea, vomiting, bowel or bladder changes.  She is without fever or chills.  She hasn't noted any unusual headaches or vision changes.  A detailed ROS was otherwise non contributory.     HISTORY OF CURRENT ILLNESS: From the original intake note:  [name] presented with swelling along the left side of the face with neck pain. She then underwent a  neck CT on 04/11/2019 showing: Enlarged left level 2 lymph nodes with associated inflammatory change including stranding of the adjacent fat and thickening of the platysma. While malignancy is not excluded, this is most concerning for infection. No focal abscess is present. Hypoattenuation within 1 of the left level 2 lymph nodes likely represents central necrosis or suppuration of the node. No primary malignancy or other abscess. Left upper lobe pulmonary nodules may be inflammatory. The largest measures 0.7 x 0.7 x 0.6 cm. CT of the chest with contrast may be useful for further evaluation.  She also underwent a chest CT on the same day showing: Complex right breast mass measuring 5.6 x 4.5 x 4.7 cm consistent with a primary breast carcinoma. Malignant right axillary adenopathy measuring up to 3.1 cm. Multiple bilateral pulmonary nodules are concerning for metastatic disease. Given the right breast mass, the left cervical lymph nodes are more likely malignant.  She then underwent bilateral diagnostic mammography with tomography and right breast ultrasonography at [facility] on 04/19/2019 showing: Breast Density Category C; findings which are highly suspicious for multicentric inflammatory right breast cancer with right axillary nodal metastatic disease; no mammographic evidence of malignancy involving the left breast.  Accordingly on 04/19/2019 she proceeded to biopsy of the right breast area in question. The pathology from this procedure showed ([id number]): invasive ductal carcinoma, grade III, upper inner quadrant, 12:30 o'clock, 5.0 cm from the nipple. Prognostic indicators significant for: estrogen receptor, 0% negative and progesterone receptor, 0% negative. Proliferation marker Ki67 at 90%. HER2 negative (0+).  Additional biopsies of the right breast and right axilla were performed on the same day. The pathology from this procedure showed ([id number]): 2.  Breast, right, needle core biopsy,  satellite mass UOQ, 10 o'clock, 8cmfn  - Invasive ductal carcinoma, grade III 3. Lymph node, needle/core biopsy, level 1 right axilla   - Invasive ductal carcinoma, grade III  The patient's subsequent history is as detailed below.   PAST MEDICAL HISTORY: Past Medical History:  Diagnosis Date  . Anemia    after pregnancy  . Anxiety    severe not taking meds  . Asthma    inhaler  4x per day  . Chronic pain   . Family history of breast cancer   . History of substance abuse ([place])   . Pregnancy induced hypertension    takes medication now.  . Psoriasis   . Trichomonas infection   . Vaginal Pap smear, abnormal      PAST SURGICAL HISTORY: Past Surgical History:  Procedure Laterality Date  . CESAREAN SECTION  05/26/2008  . CESAREAN SECTION N/A 07/10/2017   Procedure: CESAREAN SECTION;  Surgeon: Florian Buff, MD;  Location: [facility];  Service: Obstetrics;  Laterality: N/A;  . CESAREAN SECTION N/A 09/23/2018   Procedure: CESAREAN SECTION;  Surgeon: Donnamae Jude, MD;  Location: [facility];  Service: Obstetrics;  Laterality: N/A;  . COLPOSCOPY    . PORTACATH PLACEMENT Left 04/30/2019   Procedure: INSERTION PORT-A-CATH WITH ULTRASOUND;  Surgeon: Alphonsa Overall, MD;  Location: [facility];  Service: General;  Laterality: Left;  Left Subclavian vein  . WISDOM TOOTH EXTRACTION       FAMILY HISTORY: Family History  Problem Relation Age of Onset  . Kidney disease Maternal Grandmother   . Breast cancer Maternal Grandmother 28       metastatic to liver; d. 64  . Kidney disease Maternal Grandfather   . Kidney disease Paternal Grandmother   . Diabetes Paternal Grandmother   . Breast cancer Paternal Grandmother   . Kidney disease Paternal Grandfather   . Breast cancer Paternal Aunt   . Cerebral palsy Child   . Breast cancer Paternal Great-grandmother   . Pancreatic cancer Other        PGMs brother   . Throat cancer Paternal Uncle   . Breast cancer Other        PGMs  sister  (Updated 04/25/2019) Alley's father is living at age 81. Patients' mother is also living at age 45. Marland [place] notes, however, that she was raised by her grandmother.) The patient has 0 brothers and 5 sisters. Patient denies anyone in her family having ovarian, prostate, or pancreatic cancer. Her maternal grandmother was diagnosed with breast cancer, unsure what age. On her father's side, she reports her grandmother had cysts in her breasts, her great-aunt might have had breast cancer, and her great-grandmother was diagnosed with breast cancer at an older age.   GYNECOLOGIC HISTORY:  No LMP recorded. (Menstrual status: Chemotherapy).  She had an emergent C-section in 09/23/2018 for abruptio placenta at 30 weeks pregnancy, also has a history of eclampsia Menarche:    years old Age at first live birth: 33 years old [place] P: 3 LMP: 04/18/2019 Contraceptive: none HRT: none  Hysterectomy?: no BSO?: no   SOCIAL HISTORY: (Current as of 04/25/2019) Terri Estes is currently unemployed. She previously worked at [employer]. She is currently engaged. Fiance [name] runs a Midwife and works other odd jobs. Daughter Demetrius Charity, age 41, was abused by her father at 62 months and has cerebral palsy. Son [name], age 83, has severe asthma and is developmentally disabled. Daughter Yetta Glassman was born on  09/23/2018 prematurely and remains on oxygen at home.    ADVANCED DIRECTIVES: not in place.    HEALTH MAINTENANCE: Social History   Tobacco Use  . Smoking status: Former Smoker    Packs/day: 0.50    Years: 5.00    Pack years: 2.50    Types: Cigarettes    Quit date: 04/10/2019    Years since quitting: 0.2  . Smokeless tobacco: Never Used  . Tobacco comment: trying to quit  Substance Use Topics  . Alcohol use: No  . Drug use: Yes    Frequency: 7.0 times per week    Types: Marijuana    Comment: subutex    Colonoscopy: never done  PAP: 05/10/2018  Bone density: never done Mammography:  first performed following abnormal CT  No Known Allergies  Current Outpatient Medications  Medication Sig Dispense Refill  . albuterol (PROVENTIL HFA;VENTOLIN HFA) 108 (90 Base) MCG/ACT inhaler Inhale 2 puffs into the lungs 4 (four) times daily. 1 Inhaler 2  . buprenorphine (SUBUTEX) 2 MG SUBL SL tablet Place 4 mg under the tongue at bedtime.    . buprenorphine (SUBUTEX) 8 MG SUBL SL tablet Place 8 mg under the tongue daily.     . cholestyramine (QUESTRAN) 4 g packet Take 1 packet (4 g total) by mouth 3 (three) times daily with meals. 60 each 12  . cyclobenzaprine (FLEXERIL) 10 MG tablet Take 1 tablet (10 mg total) by mouth 3 (three) times daily as needed for muscle spasms. 90 tablet 2  . dexamethasone (DECADRON) 4 MG tablet TAKE 2 TABLETS BY MOUTH DAILY ON THE DAY AFTER CHEMOTHERAPY, AND THEN TAKE 2 TABLETS TWICE DAILY FOR 2 DAYS AFTERWARDS. TAKE WITH FOOD 30 tablet 1  . enalapril (VASOTEC) 10 MG tablet TAKE 1 TABLET BY MOUTH DAILY 90 tablet 0  . gabapentin (NEURONTIN) 300 MG capsule Take 1 capsule (300 mg total) by mouth 3 (three) times daily. 90 capsule 0  . omeprazole (PRILOSEC) 40 MG capsule Take 1 capsule (40 mg total) by mouth daily. 30 capsule 5  . ondansetron (ZOFRAN) 8 MG tablet TAKE 1 TABLET(8 MG) BY MOUTH EVERY 8 HOURS AS NEEDED FOR NAUSEA OR VOMITING 20 tablet 0  . PARoxetine (PAXIL) 10 MG tablet Take 1 tablet (10 mg total) by mouth daily. (Patient taking differently: Take 10 mg by mouth daily. Patient is taking 2 tablets daily per MD) 30 tablet 5  . Prenatal MV-Min-FA-Omega-3 (PRENATAL GUMMIES/DHA & FA) 0.4-32.5 MG CHEW Chew 2 each by mouth daily.    . prochlorperazine (COMPAZINE) 10 MG tablet TAKE 1 TABLET(10 MG) BY MOUTH EVERY 6 HOURS AS NEEDED FOR NAUSEA OR VOMITING 30 tablet 1  . valACYclovir (VALTREX) 1000 MG tablet Take 1 tablet (1,000 mg total) by mouth daily. 60 tablet 0   No current facility-administered medications for this visit.    Facility-Administered Medications  Ordered in Other Visits  Medication Dose Route Frequency Provider Last Rate Last Dose  . 0.9 % NaCl with KCl 20 mEq/ L  infusion   Intravenous Once Magrinat, [name], MD      . sodium chloride flush (NS) 0.9 % injection 10 mL  10 mL Intracatheter PRN Magrinat, [name], MD         OBJECTIVE: Young African-American woman who appears stated age  Vitals:   06/26/19 1226  BP: 103/71  Pulse: (!) 107  Resp: 19  Temp: 98.3 F (36.8 C)  SpO2: 100%     Body mass index is 40.74 kg/m.  Wt Readings from Last 3 Encounters:  06/26/19 208 lb 9.6 oz (94.6 kg)  06/19/19 198 lb 11.2 oz (90.1 kg)  06/05/19 194 lb 9.6 oz (88.3 kg)  ECOG FS:1 - Symptomatic but completely ambulatory GENERAL: Patient is a well appearing female in no acute distress HEENT:  Sclerae anicteric.  Oropharynx clear and moist. No ulcerations or evidence of oropharyngeal candidiasis. Neck is supple.  NODES:  No cervical, supraclavicular, or axillary lymphadenopathy palpated.  BREAST EXAM:  Deferred. LUNGS:  Clear to auscultation bilaterally.  No wheezes or rhonchi. HEART:  Regular rate and rhythm. No murmur appreciated. ABDOMEN:  Soft, nontender.  Positive, normoactive bowel sounds. No organomegaly palpated. MSK:  No focal spinal tenderness to palpation. Full range of motion bilaterally in the upper extremities. EXTREMITIES:  No peripheral edema.   SKIN:  Clear with no obvious rashes or skin changes. No nail dyscrasia. NEURO:  Nonfocal. Well oriented.  Appropriate affect. GYN: Patient with very small appears to be healing erythematous lesion at 9 o'clock on urethral opening.  Viral culture taken.  No other lesions, no vaginal discharge noted.      LAB RESULTS:  CMP     Component Value Date/Time   NA 140 06/19/2019 1128   NA 137 05/10/2018 1535   K 4.0 06/19/2019 1128   CL 105 06/19/2019 1128   CO2 27 06/19/2019 1128   GLUCOSE 93 06/19/2019 1128   BUN 13 06/19/2019 1128   BUN 7 05/10/2018 1535   CREATININE  0.85 06/19/2019 1128   CREATININE 0.78 05/11/2019 1305   CALCIUM 8.8 (L) 06/19/2019 1128   PROT 6.4 (L) 06/19/2019 1128   PROT 7.2 05/10/2018 1535   ALBUMIN 3.4 (L) 06/19/2019 1128   ALBUMIN 4.2 05/10/2018 1535   AST 36 06/19/2019 1128   AST 20 05/11/2019 1305   ALT 59 (H) 06/19/2019 1128   ALT 44 05/11/2019 1305   ALKPHOS 78 06/19/2019 1128   BILITOT <0.2 (L) 06/19/2019 1128   BILITOT 0.4 05/11/2019 1305   GFRNONAA >60 06/19/2019 1128   GFRNONAA >60 05/11/2019 1305   GFRAA >60 06/19/2019 1128   GFRAA >60 05/11/2019 1305    No results found for: TOTALPROTELP, ALBUMINELP, A1GS, A2GS, BETS, BETA2SER, GAMS, MSPIKE, SPEI  No results found for: KPAFRELGTCHN, LAMBDASER, KAPLAMBRATIO  Lab Results  Component Value Date   WBC 8.1 06/26/2019   NEUTROABS 3.8 06/26/2019   HGB 9.5 (L) 06/26/2019   HCT 29.2 (L) 06/26/2019   MCV 97.0 06/26/2019   PLT 203 06/26/2019    _0 @  No results found for: LABCA2  No components found for: [id number]  No results for input(s): INR in the last 168 hours.  No results found for: LABCA2  No results found for: RJJ884  No results found for: ZYS063  No results found for: KZS010  No results found for: CA2729  No components found for: HGQUANT  No results found for: CEA1 / No results found for: CEA1   No results found for: AFPTUMOR  No results found for: CHROMOGRNA  No results found for: PSA1  Appointment on 06/26/2019  Component Date Value Ref Range Status  . WBC 06/26/2019 8.1  4.0 - 10.5 K/uL Final  . RBC 06/26/2019 3.01* 3.87 - 5.11 MIL/uL Final  . Hemoglobin 06/26/2019 9.5* 12.0 - 15.0 g/dL Final  . HCT 06/26/2019 29.2* 36.0 - 46.0 % Final  . MCV 06/26/2019 97.0  80.0 - 100.0 fL Final  . MCH 06/26/2019 31.6  26.0 - 34.0 pg Final  .  MCHC 06/26/2019 32.5  30.0 - 36.0 g/dL Final  . RDW 06/26/2019 17.2* 11.5 - 15.5 % Final  . Platelets 06/26/2019 203  150 - 400 K/uL Final  . nRBC 06/26/2019 0.0  0.0 - 0.2 % Final  .  Neutrophils Relative % 06/26/2019 47  % Final  . Neutro Abs 06/26/2019 3.8  1.7 - 7.7 K/uL Final  . Lymphocytes Relative 06/26/2019 49  % Final  . Lymphs Abs 06/26/2019 3.9  0.7 - 4.0 K/uL Final  . Monocytes Relative 06/26/2019 3  % Final  . Monocytes Absolute 06/26/2019 0.3  0.1 - 1.0 K/uL Final  . Eosinophils Relative 06/26/2019 0  % Final  . Eosinophils Absolute 06/26/2019 0.0  0.0 - 0.5 K/uL Final  . Basophils Relative 06/26/2019 0  % Final  . Basophils Absolute 06/26/2019 0.0  0.0 - 0.1 K/uL Final  . Immature Granulocytes 06/26/2019 1  % Final  . Abs Immature Granulocytes 06/26/2019 0.10* 0.00 - 0.07 K/uL Final   Performed at [facility], [address] [place], [place] [address]    (this displays the last labs from the last 3 days)  No results found for: TOTALPROTELP, ALBUMINELP, A1GS, A2GS, BETS, BETA2SER, GAMS, MSPIKE, SPEI (this displays SPEP labs)  No results found for: KPAFRELGTCHN, LAMBDASER, KAPLAMBRATIO (kappa/lambda light chains)  Lab Results  Component Value Date   HGBA 97.4 12/16/2016   (Hemoglobinopathy evaluation)   No results found for: LDH  No results found for: IRON, TIBC, IRONPCTSAT (Iron and TIBC)  No results found for: FERRITIN  Urinalysis    Component Value Date/Time   COLORURINE COLORLESS (A) 08/20/2018 0704   APPEARANCEUR CLEAR 08/20/2018 0704   LABSPEC 1.003 (L) 08/20/2018 0704   PHURINE 7.0 08/20/2018 0704   GLUCOSEU NEGATIVE 08/20/2018 0704   HGBUR NEGATIVE 08/20/2018 0704   BILIRUBINUR NEGATIVE 08/20/2018 0704   KETONESUR NEGATIVE 08/20/2018 0704   PROTEINUR NEGATIVE 08/20/2018 0704   UROBILINOGEN 4.0 (H) 01/13/2018 1417   NITRITE NEGATIVE 08/20/2018 0704   LEUKOCYTESUR NEGATIVE 08/20/2018 0704     STUDIES:  No results found.   ELIGIBLE FOR AVAILABLE RESEARCH PROTOCOL: no   ASSESSMENT: 33 y.o. [place] woman status post right breast biopsy x2 and right axillary lymph node biopsy 04/19/2019 for a  clinical T4 N2 MX, stage IIIC invasive ductal carcinoma, grade 3, triple negative, with an MIB-1 of 90%  (a) chest CT scan 04/11/2019 shows multiple subcentimeter lung nodules  (b) PET scan denied by insurance, CT A/P and bone scan due 05/09/2019  (c) baseline breast MRI 04/30/2019 shows involvement of all 4 quadrants in the right breast as well as right axillary subpectoral and internal mammary lymphadenopathy.  Left breast was unremarkable  (1) neoadjuvant chemotherapy consisting of  (a) pembrolizumab started 05/03/2019, held after first dose because of elevated liver function tests  (b) paclitaxel weekly x12 with carboplatin every 21 days x 4, started 05/08/2019   (i) tolerated carboplatin AUC 5-day 1 poorly, switched to AUC 2 on day 1 day 8   (ii) cycle 2 delayed 1 week because of elevated liver function tests  (c) cyclophosphamide and doxorubicin in dose dense fashion x4 to follow   (2) definitive surgery pending  (3) adjuvant radiation to follow  (4) genetics testing  (a) genetic testing 05/14/2019 through the Common Hereditary Gene Panel offered by [facility] found no deleterious mutations in APC, ATM, AXIN2, BARD1, BMPR1A, BRCA1, BRCA2, BRIP1, CDH1, CDK4, CDKN2A (p14ARF), CDKN2A (p16INK4a), CHEK2, CTNNA1, DICER1, EPCAM (Deletion/duplication testing only), GREM1 (  promoter region deletion/duplication testing only), KIT, MEN1, MLH1, MSH2, MSH3, MSH6, MUTYH, NBN, NF1, NHTL1, PALB2, PDGFRA, PMS2, POLD1, POLE, PTEN, RAD50, RAD51C, RAD51D, RNF43, SDHB, SDHC, SDHD, SMAD4, SMARCA4. STK11, TP53, TSC1, TSC2, and VHL.  The following genes were evaluated for sequence changes only: SDHA and HOXB13 c.251G>A variant only  (5) goserelin started 05/08/2019   PLAN:  Terri Estes is doing moderately well.  Her labs are stable and she will proceed with her treatment with Paclitaxel and Carboplatin.  We are monitoring her closely for peripheral neuropathy, which she has not developed at this point.  Terri Estes  still has significant dysuria.  She has a small lesion on her urethra.  It is unclear what this is.  I was unable to unroof anything, but did swab and send viral culture.  Also obtained serum HSV.  Repeat urine obtained, and appears to remain infected and I sent macrobid in for her to take as well.  Until we get results from her tests I instructed her to increase her Valtrex to BID and prescribed Xerese cream.  She was instructed to use her peri care bottle she still has from the birth of her child to rinse her perineal area with lukewarm water and blot dry instead of harshly wiping or using anything with chemicals.    We will see her with every treatment at this point.  She returns next week.  She knows to call for any other issues that may develop before the next visit.  A total of (30) minutes of face-to-face time was spent with this patient with greater than 50% of that time in counseling and care-coordination.    Wilber Bihari, NP 06/26/19 12:35 PM Medical Oncology and Hematology [facility] [address] [place], [place] [address] Tel. [phone]    Fax. [phone]

## 2019-06-26 NOTE — Patient Instructions (Signed)

## 2019-06-27 ENCOUNTER — Encounter: Payer: Self-pay | Admitting: General Practice

## 2019-06-27 ENCOUNTER — Other Ambulatory Visit: Payer: Self-pay

## 2019-06-27 ENCOUNTER — Encounter: Payer: Self-pay | Admitting: Adult Health

## 2019-06-27 LAB — URINE CULTURE: Culture: NO GROWTH

## 2019-06-27 LAB — HSV(HERPES SIMPLEX VRS) I + II AB-IGG
HSV 1 Glycoprotein G Ab, IgG: 2.84 index — ABNORMAL HIGH (ref 0.00–0.90)
HSV 2 Glycoprotein G Ab, IgG: 6.54 index — ABNORMAL HIGH (ref 0.00–0.90)

## 2019-06-27 MED ORDER — ACYCLOVIR 5 % EX OINT: 1.0000 "application " | TOPICAL_OINTMENT | Freq: Two times a day (BID) | CUTANEOUS | 0 refills | Status: DC | PRN

## 2019-06-27 NOTE — Progress Notes (Unsigned)
A new prescription for acyclovir cream has been sent to pt's pharmacy d/t medicaid will not cover the prescription sent on 6/30 per her pharmacy. Spoke with pt on phone and gave verbal instructions on using the cream.  Pt verbalizes understanding and advised to call this office with any questions or concerns.

## 2019-06-27 NOTE — Progress Notes (Signed)
[facility] Spiritual Care Note  LVM encouraging callback to set up appt to talk further, as [name] has verbalized need for and appreciation of regular chaplain calls.   [place], [place], [facility] Pager [phone] Voicemail [phone]

## 2019-06-28 ENCOUNTER — Ambulatory Visit: Admission: RE | Admit: 2019-06-28 | Payer: Medicaid Other | Source: Ambulatory Visit

## 2019-07-03 ENCOUNTER — Inpatient Hospital Stay: Payer: Medicaid Other

## 2019-07-03 ENCOUNTER — Inpatient Hospital Stay: Payer: Medicaid Other | Attending: Oncology

## 2019-07-03 ENCOUNTER — Other Ambulatory Visit: Payer: Self-pay

## 2019-07-03 ENCOUNTER — Encounter: Payer: Self-pay | Admitting: General Practice

## 2019-07-03 ENCOUNTER — Inpatient Hospital Stay (HOSPITAL_BASED_OUTPATIENT_CLINIC_OR_DEPARTMENT_OTHER): Payer: Medicaid Other | Admitting: Adult Health

## 2019-07-03 ENCOUNTER — Encounter: Payer: Self-pay | Admitting: *Deleted

## 2019-07-03 ENCOUNTER — Encounter: Payer: Self-pay | Admitting: Adult Health

## 2019-07-03 VITALS — BP 104/66 | HR 108 | Temp 97.8°F | Resp 18 | Ht 60.0 in | Wt 206.5 lb

## 2019-07-03 DIAGNOSIS — L299 Pruritus, unspecified: Secondary | ICD-10-CM | POA: Insufficient documentation

## 2019-07-03 DIAGNOSIS — R300 Dysuria: Secondary | ICD-10-CM | POA: Insufficient documentation

## 2019-07-03 DIAGNOSIS — C50811 Malignant neoplasm of overlapping sites of right female breast: Secondary | ICD-10-CM | POA: Diagnosis present

## 2019-07-03 DIAGNOSIS — E860 Dehydration: Secondary | ICD-10-CM | POA: Insufficient documentation

## 2019-07-03 DIAGNOSIS — H9201 Otalgia, right ear: Secondary | ICD-10-CM | POA: Insufficient documentation

## 2019-07-03 DIAGNOSIS — R112 Nausea with vomiting, unspecified: Secondary | ICD-10-CM | POA: Insufficient documentation

## 2019-07-03 DIAGNOSIS — R200 Anesthesia of skin: Secondary | ICD-10-CM | POA: Diagnosis not present

## 2019-07-03 DIAGNOSIS — C773 Secondary and unspecified malignant neoplasm of axilla and upper limb lymph nodes: Secondary | ICD-10-CM | POA: Insufficient documentation

## 2019-07-03 DIAGNOSIS — B370 Candidal stomatitis: Secondary | ICD-10-CM | POA: Diagnosis not present

## 2019-07-03 DIAGNOSIS — Z5111 Encounter for antineoplastic chemotherapy: Secondary | ICD-10-CM | POA: Insufficient documentation

## 2019-07-03 DIAGNOSIS — J45909 Unspecified asthma, uncomplicated: Secondary | ICD-10-CM | POA: Diagnosis not present

## 2019-07-03 DIAGNOSIS — Z87891 Personal history of nicotine dependence: Secondary | ICD-10-CM | POA: Diagnosis not present

## 2019-07-03 DIAGNOSIS — D701 Agranulocytosis secondary to cancer chemotherapy: Secondary | ICD-10-CM | POA: Diagnosis not present

## 2019-07-03 DIAGNOSIS — R21 Rash and other nonspecific skin eruption: Secondary | ICD-10-CM | POA: Diagnosis not present

## 2019-07-03 DIAGNOSIS — Z803 Family history of malignant neoplasm of breast: Secondary | ICD-10-CM | POA: Diagnosis not present

## 2019-07-03 DIAGNOSIS — Z79899 Other long term (current) drug therapy: Secondary | ICD-10-CM

## 2019-07-03 DIAGNOSIS — R5383 Other fatigue: Secondary | ICD-10-CM | POA: Insufficient documentation

## 2019-07-03 DIAGNOSIS — G8929 Other chronic pain: Secondary | ICD-10-CM

## 2019-07-03 DIAGNOSIS — G620 Drug-induced polyneuropathy: Secondary | ICD-10-CM | POA: Insufficient documentation

## 2019-07-03 DIAGNOSIS — B3781 Candidal esophagitis: Secondary | ICD-10-CM | POA: Insufficient documentation

## 2019-07-03 DIAGNOSIS — R000 Tachycardia, unspecified: Secondary | ICD-10-CM | POA: Insufficient documentation

## 2019-07-03 DIAGNOSIS — D649 Anemia, unspecified: Secondary | ICD-10-CM | POA: Insufficient documentation

## 2019-07-03 DIAGNOSIS — Z171 Estrogen receptor negative status [ER-]: Secondary | ICD-10-CM

## 2019-07-03 LAB — CBC WITH DIFFERENTIAL/PLATELET
Abs Immature Granulocytes: 0.05 10*3/uL (ref 0.00–0.07)
Basophils Absolute: 0 10*3/uL (ref 0.0–0.1)
Basophils Relative: 0 %
Eosinophils Absolute: 0 10*3/uL (ref 0.0–0.5)
Eosinophils Relative: 0 %
HCT: 29.6 % — ABNORMAL LOW (ref 36.0–46.0)
Hemoglobin: 9.4 g/dL — ABNORMAL LOW (ref 12.0–15.0)
Immature Granulocytes: 1 %
Lymphocytes Relative: 56 %
Lymphs Abs: 3.2 10*3/uL (ref 0.7–4.0)
MCH: 31.2 pg (ref 26.0–34.0)
MCHC: 31.8 g/dL (ref 30.0–36.0)
MCV: 98.3 fL (ref 80.0–100.0)
Monocytes Absolute: 0.2 10*3/uL (ref 0.1–1.0)
Monocytes Relative: 3 %
Neutro Abs: 2.3 10*3/uL (ref 1.7–7.7)
Neutrophils Relative %: 40 %
Platelets: 192 10*3/uL (ref 150–400)
RBC: 3.01 MIL/uL — ABNORMAL LOW (ref 3.87–5.11)
RDW: 18.1 % — ABNORMAL HIGH (ref 11.5–15.5)
WBC: 5.8 10*3/uL (ref 4.0–10.5)
nRBC: 0.7 % — ABNORMAL HIGH (ref 0.0–0.2)

## 2019-07-03 LAB — CMP (CANCER CENTER ONLY)
ALT: 102 U/L — ABNORMAL HIGH (ref 0–44)
AST: 54 U/L — ABNORMAL HIGH (ref 15–41)
Albumin: 3.4 g/dL — ABNORMAL LOW (ref 3.5–5.0)
Alkaline Phosphatase: 68 U/L (ref 38–126)
Anion gap: 8 (ref 5–15)
BUN: 14 mg/dL (ref 6–20)
CO2: 29 mmol/L (ref 22–32)
Calcium: 8.7 mg/dL — ABNORMAL LOW (ref 8.9–10.3)
Chloride: 102 mmol/L (ref 98–111)
Creatinine: 0.81 mg/dL (ref 0.44–1.00)
GFR, Est AFR Am: >60 mL/min (ref >60)
GFR, Estimated: >60 mL/min (ref >60)
Glucose, Bld: 83 mg/dL (ref 70–99)
Potassium: 4.3 mmol/L (ref 3.5–5.1)
Sodium: 139 mmol/L (ref 135–145)
Total Bilirubin: <0.2 mg/dL — ABNORMAL LOW (ref 0.3–1.2)
Total Protein: 6.2 g/dL — ABNORMAL LOW (ref 6.5–8.1)

## 2019-07-03 MED ORDER — HEPARIN SOD (PORK) LOCK FLUSH 100 UNIT/ML IV SOLN
500.0000 [IU] | Freq: Once | INTRAVENOUS | Status: AC | PRN
  Administered 2019-07-03: 500 [IU]
  Filled 2019-07-03: qty 5

## 2019-07-03 MED ORDER — PEGFILGRASTIM 6 MG/0.6ML ~~LOC~~ PSKT
6.0000 mg | PREFILLED_SYRINGE | Freq: Once | SUBCUTANEOUS | Status: AC
  Administered 2019-07-03: 15:00:00 6 mg via SUBCUTANEOUS

## 2019-07-03 MED ORDER — PALONOSETRON HCL INJECTION 0.25 MG/5ML
INTRAVENOUS | Status: AC
  Filled 2019-07-03: qty 5

## 2019-07-03 MED ORDER — GOSERELIN ACETATE 3.6 MG ~~LOC~~ IMPL
3.6000 mg | DRUG_IMPLANT | Freq: Once | SUBCUTANEOUS | Status: AC
  Administered 2019-07-03: 3.6 mg via SUBCUTANEOUS

## 2019-07-03 MED ORDER — PEGFILGRASTIM 6 MG/0.6ML ~~LOC~~ PSKT
PREFILLED_SYRINGE | SUBCUTANEOUS | Status: AC
  Filled 2019-07-03: qty 0.6

## 2019-07-03 MED ORDER — SODIUM CHLORIDE 0.9 % IV SOLN
Freq: Once | INTRAVENOUS | Status: AC
  Administered 2019-07-03: 12:00:00 via INTRAVENOUS
  Filled 2019-07-03: qty 5

## 2019-07-03 MED ORDER — DOXORUBICIN HCL CHEMO IV INJECTION 2 MG/ML
60.0000 mg/m2 | Freq: Once | INTRAVENOUS | Status: AC
  Administered 2019-07-03: 13:00:00 120 mg via INTRAVENOUS
  Filled 2019-07-03: qty 60

## 2019-07-03 MED ORDER — SODIUM CHLORIDE 0.9 % IV SOLN
Freq: Once | INTRAVENOUS | Status: AC
  Administered 2019-07-03: 16:00:00 via INTRAVENOUS
  Filled 2019-07-03: qty 1000

## 2019-07-03 MED ORDER — SODIUM CHLORIDE 0.9 % IV SOLN
Freq: Once | INTRAVENOUS | Status: AC
  Administered 2019-07-03: 12:00:00 via INTRAVENOUS
  Filled 2019-07-03: qty 250

## 2019-07-03 MED ORDER — PALONOSETRON HCL INJECTION 0.25 MG/5ML
0.2500 mg | Freq: Once | INTRAVENOUS | Status: AC
  Administered 2019-07-03: 0.25 mg via INTRAVENOUS

## 2019-07-03 MED ORDER — SODIUM CHLORIDE 0.9% FLUSH
10.0000 mL | INTRAVENOUS | Status: DC | PRN
  Administered 2019-07-03: 10 mL
  Filled 2019-07-03: qty 10

## 2019-07-03 MED ORDER — GOSERELIN ACETATE 3.6 MG ~~LOC~~ IMPL
DRUG_IMPLANT | SUBCUTANEOUS | Status: AC
  Filled 2019-07-03: qty 3.6

## 2019-07-03 MED ORDER — SODIUM CHLORIDE 0.9 % IV SOLN
600.0000 mg/m2 | Freq: Once | INTRAVENOUS | Status: AC
  Administered 2019-07-03: 14:00:00 1200 mg via INTRAVENOUS
  Filled 2019-07-03: qty 60

## 2019-07-03 NOTE — Progress Notes (Signed)
[place]  [phone] [phone]    ID: Terri Estes DOB: 11/26/1986  MR#: [id number]  MQK#:[id number]  Patient Care Team: Patient, No Pcp Per as PCP - General (General Practice) Alphonsa Overall, MD as Consulting Physician (General Surgery) Magrinat, [name], MD as Consulting Physician (Oncology) Kyung Rudd, MD as Consulting Physician (Radiation Oncology) Rockwell Germany, RN as Oncology Nurse Navigator Mauro Kaufmann, RN as Oncology Nurse Navigator Donnamae Jude, MD as Consulting Physician (Obstetrics and Gynecology) Pavelock, Ralene Bathe, MD as Consulting Physician (Internal Medicine) OTHER MD: Joylene Igo for [facility] is [phone]   CHIEF COMPLAINT: Triple negative breast cancer  CURRENT TREATMENT: Neoadjuvant chemotherapy[/immunotherapy]/goserelin   INTERVAL HISTORY: Terri Estes returns today for follow-up and treatment of her estrogen negative breast cancer.  She continues on neoadjuvant chemotherapy consisting of paclitaxel and carboplatin weekly x12. Today is day 1 cycle 6.  She has intermittent numbness in her hands that she describes as an electric type pain.  She says that it will feel asleep. She denies any numbness  In her feet.  She says it is worse in her fingertips.    She is also receiving goserelin every 28 days, with her most recent dose 06/05/2019.  She is due for another dose today.   REVIEW OF SYSTEMS: Terri Estes denies peripheral neuropathy.  She is concerned that she is retaining fluids.  She says she has some gas pains, and she takes gasx daily.  Her bowels are regular, and she is happy about this.  She is drinking more fluids.  She notes she is eating smaller portions, and nibbles on food throughout the day.  She notes that she has some vaginal dryness.  She wants to know what to do for this.    At her last appointment she was having dysuria, not improved on cipro.  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  She has been applying Acyclovir cream with hydrocortisone cream which is helping the dysuria. She is taking the valtrex BID.    Terri Estes missed her appointment for her breast ultrasound.  She notes that she is still having some difficulty with nipple discharge.  Her breast she notes feels engorged and her ultrasound appointment has been rescheduled for 07/10/2019.  Terri Estes noted she is having continued difficulty at home taking care of her children with special needs, due to her fatigue with her chemotherapy.  She notes that services her children were receiving prior to [place] were very helpful, and now, she notes they are cooped up and things are worse than prior.  Her family calls her to check on her, but aren't physically helping.  Her fiance, who works with a [employer] who is most helpful, but cannot be there full time.  She is working with    HISTORY OF CURRENT ILLNESS: From the original intake note:  Terri Estes presented with swelling along the left side of the face with neck pain. She then underwent a neck CT on 04/11/2019 showing: Enlarged left level 2 lymph nodes with associated inflammatory change including stranding of the adjacent fat and thickening of the platysma. While malignancy is not excluded, this is most concerning for infection. No focal abscess is present. Hypoattenuation within 1 of the left level 2 lymph nodes likely represents central necrosis or suppuration of the node. No primary malignancy or other abscess. Left upper lobe pulmonary nodules may be inflammatory. The largest measures 0.7 x 0.7  x 0.6 cm. CT of the chest with contrast may be useful for further evaluation.  She also underwent a chest CT on the same day showing: Complex right breast mass measuring 5.6 x 4.5 x 4.7 cm consistent with a primary breast carcinoma. Malignant right axillary adenopathy measuring up to 3.1 cm. Multiple bilateral pulmonary nodules are  concerning for metastatic disease. Given the right breast mass, the left cervical lymph nodes are more likely malignant.  She then underwent bilateral diagnostic mammography with tomography and right breast ultrasonography at [facility] on 04/19/2019 showing: Breast Density Category C; findings which are highly suspicious for multicentric inflammatory right breast cancer with right axillary nodal metastatic disease; no mammographic evidence of malignancy involving the left breast.  Accordingly on 04/19/2019 she proceeded to biopsy of the right breast area in question. The pathology from this procedure showed ([id number]): invasive ductal carcinoma, grade III, upper inner quadrant, 12:30 o'clock, 5.0 cm from the nipple. Prognostic indicators significant for: estrogen receptor, 0% negative and progesterone receptor, 0% negative. Proliferation marker Ki67 at 90%. HER2 negative (0+).  Additional biopsies of the right breast and right axilla were performed on the same day. The pathology from this procedure showed ([id number]): 2. Breast, right, needle core biopsy, satellite mass UOQ, 10 o'clock, 8cmfn  - Invasive ductal carcinoma, grade III 3. Lymph node, needle/core biopsy, level 1 right axilla   - Invasive ductal carcinoma, grade III  The patient's subsequent history is as detailed below.   PAST MEDICAL HISTORY: Past Medical History:  Diagnosis Date   Anemia    after pregnancy   Anxiety    severe not taking meds   Asthma    inhaler  4x per day   Chronic pain    Family history of breast cancer    History of substance abuse ([place])    Pregnancy induced hypertension    takes medication now.   Psoriasis    Trichomonas infection    Vaginal Pap smear, abnormal      PAST SURGICAL HISTORY: Past Surgical History:  Procedure Laterality Date   CESAREAN SECTION  05/26/2008   CESAREAN SECTION N/A 07/10/2017   Procedure: CESAREAN SECTION;  Surgeon: Florian Buff, MD;   Location: [place];  Service: Obstetrics;  Laterality: N/A;   CESAREAN SECTION N/A 09/23/2018   Procedure: CESAREAN SECTION;  Surgeon: Donnamae Jude, MD;  Location: [place];  Service: Obstetrics;  Laterality: N/A;   COLPOSCOPY     PORTACATH PLACEMENT Left 04/30/2019   Procedure: INSERTION PORT-A-CATH WITH ULTRASOUND;  Surgeon: Alphonsa Overall, MD;  Location: [facility];  Service: General;  Laterality: Left;  Left Subclavian vein   WISDOM TOOTH EXTRACTION       FAMILY HISTORY: Family History  Problem Relation Age of Onset   Kidney disease Maternal Grandmother    Breast cancer Maternal Grandmother 28       metastatic to liver; d. 32   Kidney disease Maternal Grandfather    Kidney disease Paternal Grandmother    Diabetes Paternal Grandmother    Breast cancer Paternal Grandmother    Kidney disease Paternal Grandfather    Breast cancer Paternal Aunt    Cerebral palsy Child    Breast cancer Paternal Great-grandmother    Pancreatic cancer Other        PGMs brother    Throat cancer Paternal Uncle    Breast cancer Other        PGMs sister  (Updated 04/25/2019) Christabel's father is living at age  60. Patients' mother is also living at age 87. [place] notes, however, that she was raised by her grandmother.) The patient has 0 brothers and 5 sisters. Patient denies anyone in her family having ovarian, prostate, or pancreatic cancer. Her maternal grandmother was diagnosed with breast cancer, unsure what age. On her father's side, she reports her grandmother had cysts in her breasts, her great-aunt might have had breast cancer, and her great-grandmother was diagnosed with breast cancer at an older age.   GYNECOLOGIC HISTORY:  No LMP recorded. (Menstrual status: Chemotherapy).  She had an emergent C-section in 09/23/2018 for abruptio placenta at 30 weeks pregnancy, also has a history of eclampsia Menarche:    years old Age at first live birth: 33 years old Wall P:  3 LMP: 04/18/2019 Contraceptive: none HRT: none  Hysterectomy?: no BSO?: no   SOCIAL HISTORY: (Current as of 04/25/2019) Terri Estes is currently unemployed. She previously worked at [employer]. She is currently engaged. Fiance [name] runs a Mid[employer] and works other odd jobs. Daughter Demetrius Charity, age 54, was abused by her father at 36 months and has cerebral palsy. Son [name], age 36, has severe asthma and is developmentally disabled. Daughter Yetta Glassman was born on 09/23/2018 prematurely and remains on oxygen at home.    ADVANCED DIRECTIVES: not in place.    HEALTH MAINTENANCE: Social History   Tobacco Use   Smoking status: Former Smoker    Packs/day: 0.50    Years: 5.00    Pack years: 2.50    Types: Cigarettes    Quit date: 04/10/2019    Years since quitting: 0.2   Smokeless tobacco: Never Used   Tobacco comment: trying to quit  Substance Use Topics   Alcohol use: No   Drug use: Yes    Frequency: 7.0 times per week    Types: Marijuana    Comment: subutex    Colonoscopy: never done  PAP: 05/10/2018  Bone density: never done Mammography: first performed following abnormal CT  No Known Allergies  Current Outpatient Medications  Medication Sig Dispense Refill   acyclovir ointment (ZOVIRAX) 5 % Apply 1 application topically 2 (two) times daily as needed. Mix 1 part acyclovir cream with 1 part over the counter hydrocortisone 1% cream and apply to affected area two times a day as needed 5 g 0   albuterol (PROVENTIL HFA;VENTOLIN HFA) 108 (90 Base) MCG/ACT inhaler Inhale 2 puffs into the lungs 4 (four) times daily. 1 Inhaler 2   buprenorphine (SUBUTEX) 8 MG SUBL SL tablet Place 8 mg under the tongue 2 (two) times daily.      cholestyramine (QUESTRAN) 4 g packet Take 1 packet (4 g total) by mouth 3 (three) times daily with meals. 60 each 12   cyclobenzaprine (FLEXERIL) 10 MG tablet Take 1 tablet (10 mg total) by mouth 3 (three) times daily as needed for muscle  spasms. 90 tablet 2   dexamethasone (DECADRON) 4 MG tablet TAKE 2 TABLETS BY MOUTH DAILY ON THE DAY AFTER CHEMOTHERAPY, AND THEN TAKE 2 TABLETS TWICE DAILY FOR 2 DAYS AFTERWARDS. TAKE WITH FOOD 30 tablet 1   enalapril (VASOTEC) 10 MG tablet TAKE 1 TABLET BY MOUTH DAILY 90 tablet 0   gabapentin (NEURONTIN) 300 MG capsule Take 1 capsule (300 mg total) by mouth 3 (three) times daily. 90 capsule 0   omeprazole (PRILOSEC) 40 MG capsule Take 1 capsule (40 mg total) by mouth daily. 30 capsule 5   ondansetron (ZOFRAN) 8 MG tablet TAKE 1 TABLET(8 MG)  BY MOUTH EVERY 8 HOURS AS NEEDED FOR NAUSEA OR VOMITING 20 tablet 0   PARoxetine (PAXIL) 10 MG tablet Take 1 tablet (10 mg total) by mouth daily. (Patient taking differently: Take 10 mg by mouth daily. Patient is taking 2 tablets daily per MD) 30 tablet 5   Prenatal MV-Min-FA-Omega-3 (PRENATAL GUMMIES/DHA & FA) 0.4-32.5 MG CHEW Chew 2 each by mouth daily.     prochlorperazine (COMPAZINE) 10 MG tablet TAKE 1 TABLET(10 MG) BY MOUTH EVERY 6 HOURS AS NEEDED FOR NAUSEA OR VOMITING 30 tablet 1   valACYclovir (VALTREX) 1000 MG tablet Take 1 tablet (1,000 mg total) by mouth 2 (two) times daily. 60 tablet 0   nitrofurantoin, macrocrystal-monohydrate, (MACROBID) 100 MG capsule Take 1 capsule (100 mg total) by mouth 2 (two) times daily. (Patient not taking: Reported on 07/03/2019) 14 capsule 0   No current facility-administered medications for this visit.    Facility-Administered Medications Ordered in Other Visits  Medication Dose Route Frequency Provider Last Rate Last Dose   0.9 % NaCl with KCl 20 mEq/ L  infusion   Intravenous Once Magrinat, [name], MD       sodium chloride flush (NS) 0.9 % injection 10 mL  10 mL Intracatheter PRN Magrinat, [name], MD         OBJECTIVE:   Vitals:   07/03/19 0953  BP: 104/66  Pulse: (!) 108  Resp: 18  Temp: 97.8 F (36.6 C)  SpO2: 100%     Body mass index is 40.33 kg/m.   Wt Readings from Last 3 Encounters:   07/03/19 206 lb 8 oz (93.7 kg)  06/26/19 208 lb 9.6 oz (94.6 kg)  06/19/19 198 lb 11.2 oz (90.1 kg)  ECOG FS:1 - Symptomatic but completely ambulatory GENERAL: Patient is a well appearing female in no acute distress HEENT:  Sclerae anicteric.  Oropharynx clear and moist. No ulcerations or evidence of oropharyngeal candidiasis. Neck is supple.  NODES:  No cervical, supraclavicular, or axillary lymphadenopathy palpated.  BREAST EXAM:  Rash remains on breast, dry patches noted.  Breast appears improved. LUNGS:  Clear to auscultation bilaterally.  No wheezes or rhonchi. HEART:  Regular rate and rhythm. No murmur appreciated. ABDOMEN:  Soft, nontender.  Positive, normoactive bowel sounds. No organomegaly palpated. MSK:  No focal spinal tenderness to palpation. Full range of motion bilaterally in the upper extremities. EXTREMITIES:  No peripheral edema.   SKIN:  Clear with no obvious rashes or skin changes. No nail dyscrasia. NEURO:  Nonfocal. Well oriented.  Appropriate affect. GYN: Patient with very small appears to be healing erythematous lesion at 9 o'clock on urethral opening.  Viral culture taken.  No other lesions, no vaginal discharge noted.      LAB RESULTS:  CMP     Component Value Date/Time   NA 138 06/26/2019 1155   NA 137 05/10/2018 1535   K 3.5 06/26/2019 1155   CL 102 06/26/2019 1155   CO2 27 06/26/2019 1155   GLUCOSE 91 06/26/2019 1155   BUN 19 06/26/2019 1155   BUN 7 05/10/2018 1535   CREATININE 0.85 06/26/2019 1155   CREATININE 0.78 05/11/2019 1305   CALCIUM 8.0 (L) 06/26/2019 1155   PROT 6.1 (L) 06/26/2019 1155   PROT 7.2 05/10/2018 1535   ALBUMIN 3.2 (L) 06/26/2019 1155   ALBUMIN 4.2 05/10/2018 1535   AST 29 06/26/2019 1155   AST 20 05/11/2019 1305   ALT 64 (H) 06/26/2019 1155   ALT 44 05/11/2019 1305  ALKPHOS 72 06/26/2019 1155   BILITOT <0.2 (L) 06/26/2019 1155   BILITOT 0.4 05/11/2019 1305   GFRNONAA >60 06/26/2019 1155   GFRNONAA >60 05/11/2019  1305   GFRAA >60 06/26/2019 1155   GFRAA >60 05/11/2019 1305    No results found for: TOTALPROTELP, ALBUMINELP, A1GS, A2GS, BETS, BETA2SER, GAMS, MSPIKE, SPEI  No results found for: KPAFRELGTCHN, LAMBDASER, KAPLAMBRATIO  Lab Results  Component Value Date   WBC 5.8 07/03/2019   NEUTROABS PENDING 07/03/2019   HGB 9.4 (L) 07/03/2019   HCT 29.6 (L) 07/03/2019   MCV 98.3 07/03/2019   PLT 192 07/03/2019    '@LASTCHEMISTRY' @  No results found for: LABCA2  No components found for: [id number]  No results for input(s): INR in the last 168 hours.  No results found for: LABCA2  No results found for: EXB284  No results found for: XLK440  No results found for: NUU725  No results found for: CA2729  No components found for: HGQUANT  No results found for: CEA1 / No results found for: CEA1   No results found for: AFPTUMOR  No results found for: CHROMOGRNA  No results found for: PSA1  Appointment on 07/03/2019  Component Date Value Ref Range Status   WBC 07/03/2019 5.8  4.0 - 10.5 K/uL Final   RBC 07/03/2019 3.01* 3.87 - 5.11 MIL/uL Final   Hemoglobin 07/03/2019 9.4* 12.0 - 15.0 g/dL Final   HCT 07/03/2019 29.6* 36.0 - 46.0 % Final   MCV 07/03/2019 98.3  80.0 - 100.0 fL Final   MCH 07/03/2019 31.2  26.0 - 34.0 pg Final   MCHC 07/03/2019 31.8  30.0 - 36.0 g/dL Final   RDW 07/03/2019 18.1* 11.5 - 15.5 % Final   Platelets 07/03/2019 192  150 - 400 K/uL Final   nRBC 07/03/2019 0.7* 0.0 - 0.2 % Final   Performed at [facility], [address] [place], [place] [address]   Neutrophils Relative % 07/03/2019 PENDING  % Incomplete   Neutro Abs 07/03/2019 PENDING  1.7 - 7.7 K/uL Incomplete   Band Neutrophils 07/03/2019 PENDING  % Incomplete   Lymphocytes Relative 07/03/2019 PENDING  % Incomplete   Lymphs Abs 07/03/2019 PENDING  0.7 - 4.0 K/uL Incomplete   Monocytes Relative 07/03/2019 PENDING  % Incomplete   Monocytes Absolute 07/03/2019  PENDING  0.1 - 1.0 K/uL Incomplete   Eosinophils Relative 07/03/2019 PENDING  % Incomplete   Eosinophils Absolute 07/03/2019 PENDING  0.0 - 0.5 K/uL Incomplete   Basophils Relative 07/03/2019 PENDING  % Incomplete   Basophils Absolute 07/03/2019 PENDING  0.0 - 0.1 K/uL Incomplete   WBC Morphology 07/03/2019 PENDING   Incomplete   RBC Morphology 07/03/2019 PENDING   Incomplete   Smear Review 07/03/2019 PENDING   Incomplete   Other 07/03/2019 PENDING  % Incomplete   nRBC 07/03/2019 PENDING  0 /100 WBC Incomplete   Metamyelocytes Relative 07/03/2019 PENDING  % Incomplete   Myelocytes 07/03/2019 PENDING  % Incomplete   Promyelocytes Relative 07/03/2019 PENDING  % Incomplete   Blasts 07/03/2019 PENDING  % Incomplete    (this displays the last labs from the last 3 days)  No results found for: TOTALPROTELP, ALBUMINELP, A1GS, A2GS, BETS, BETA2SER, GAMS, MSPIKE, SPEI (this displays SPEP labs)  No results found for: KPAFRELGTCHN, LAMBDASER, KAPLAMBRATIO (kappa/lambda light chains)  Lab Results  Component Value Date   HGBA 97.4 12/16/2016   (Hemoglobinopathy evaluation)   No results found for: LDH  No results found for: IRON, TIBC, IRONPCTSAT (Iron  and TIBC)  No results found for: FERRITIN  Urinalysis    Component Value Date/Time   COLORURINE YELLOW 06/26/2019 1300   APPEARANCEUR HAZY (A) 06/26/2019 1300   LABSPEC 1.019 06/26/2019 1300   PHURINE 6.0 06/26/2019 1300   GLUCOSEU NEGATIVE 06/26/2019 1300   HGBUR NEGATIVE 06/26/2019 1300   BILIRUBINUR NEGATIVE 06/26/2019 1300   KETONESUR NEGATIVE 06/26/2019 1300   PROTEINUR NEGATIVE 06/26/2019 1300   UROBILINOGEN 4.0 (H) 01/13/2018 1417   NITRITE NEGATIVE 06/26/2019 1300   LEUKOCYTESUR LARGE (A) 06/26/2019 1300     STUDIES:  No results found.   ELIGIBLE FOR AVAILABLE RESEARCH PROTOCOL: no   ASSESSMENT: 33 y.o. [place] woman status post right breast biopsy x2 and right axillary lymph node biopsy 04/19/2019  for a clinical T4 N2 MX, stage IIIC invasive ductal carcinoma, grade 3, triple negative, with an MIB-1 of 90%  (a) chest CT scan 04/11/2019 shows multiple subcentimeter lung nodules  (b) PET scan denied by insurance, CT A/P and bone scan due 05/09/2019  (c) baseline breast MRI 04/30/2019 shows involvement of all 4 quadrants in the right breast as well as right axillary subpectoral and internal mammary lymphadenopathy.  Left breast was unremarkable  (1) neoadjuvant chemotherapy consisting of  (a) pembrolizumab started 05/03/2019, held after first dose because of elevated liver function tests  (b) paclitaxel weekly x12 with carboplatin every 21 days x 4, started 05/08/2019   (i) tolerated carboplatin AUC 5-day 1 poorly, switched to AUC 2 on day 1 day 8   (ii) cycle 2 delayed 1 week because of elevated liver function tests   (iii) carboplatin and paclitaxel discontinued after 5 doses because of neuropathy, with evidence of response  (c) cyclophosphamide and doxorubicin in dose dense fashion x4 starting 07/03/2019   (2) definitive surgery pending  (3) adjuvant radiation to follow  (4) genetics testing  (a) genetic testing 05/14/2019 through the Common Hereditary Gene Panel offered by [facility] found no deleterious mutations in APC, ATM, AXIN2, BARD1, BMPR1A, BRCA1, BRCA2, BRIP1, CDH1, CDK4, CDKN2A (p14ARF), CDKN2A (p16INK4a), CHEK2, CTNNA1, DICER1, EPCAM (Deletion/duplication testing only), GREM1 (promoter region deletion/duplication testing only), KIT, MEN1, MLH1, MSH2, MSH3, MSH6, MUTYH, NBN, NF1, NHTL1, PALB2, PDGFRA, PMS2, POLD1, POLE, PTEN, RAD50, RAD51C, RAD51D, RNF43, SDHB, SDHC, SDHD, SMAD4, SMARCA4. STK11, TP53, TSC1, TSC2, and VHL.  The following genes were evaluated for sequence changes only: SDHA and HOXB13 c.251G>A variant only  (5) goserelin started 05/08/2019   PLAN:  Terri Estes is doing well.  Her labs are stable.  She has no peripheral neuropathy.  She will proceed with Paclitaxel  and Carboplatin today.  I reviewed the fact that though her viral culture was negative, the lesion on her urinary meatus was likely herpetic as I had difficulty getting a good specimen.  Also the Acyclovir/Hydrocortisone cream has been working well to alleviate the discomfort.    We will see her with every treatment at this point.  She returns next week.  She knows to call for any other issues that may develop before the next visit.  A total of (30) minutes of face-to-face time was spent with this patient with greater than 50% of that time in counseling and care-coordination.    Wilber Bihari, NP 07/03/19 10:08 AM Medical Oncology and Hematology [facility] [address] [place], [place] [address] Tel. [phone]    Fax. [phone]   ADDENDUM: Aalijah is beginning to experience some peripheral neuropathy.  This is clearly grade 1 at this point but we are going  to discontinue the paclitaxel/carboplatin at this point.  She did have significant response to treatment clinically.  If possible after completing 4 cycles of cyclophosphamide and doxorubicin we will try to get more Taxol/carbo in assuming the peripheral neuropathy has completely resolved.  She had an echocardiogram 05/01/2019 showing a normal ejection fraction.  Chauncey Cruel, MD Medical Oncology and Hematology [facility] [address] [place], [place] [address] Tel. [phone]    Fax. [phone]

## 2019-07-03 NOTE — Patient Instructions (Signed)
[facility] Discharge Instructions for Patients Receiving Chemotherapy  Today you received the following chemotherapy agents adriamcin and cytoxan  To help prevent nausea and vomiting after your treatment, we encourage you to take your nausea medication as directed.   If you develop nausea and vomiting that is not controlled by your nausea medication, call the clinic.   BELOW ARE SYMPTOMS THAT SHOULD BE REPORTED IMMEDIATELY:  *FEVER GREATER THAN 100.5 F  *CHILLS WITH OR WITHOUT FEVER  NAUSEA AND VOMITING THAT IS NOT CONTROLLED WITH YOUR NAUSEA MEDICATION  *UNUSUAL SHORTNESS OF BREATH  *UNUSUAL BRUISING OR BLEEDING  TENDERNESS IN MOUTH AND THROAT WITH OR WITHOUT PRESENCE OF ULCERS  *URINARY PROBLEMS  *BOWEL PROBLEMS  UNUSUAL RASH Items with * indicate a potential emergency and should be followed up as soon as possible.  Feel free to call the clinic should you have any questions or concerns. The clinic phone number is [phone].  Please show the [facility] at check-in to the Emergency Department and triage nurse.  Doxorubicin injection(Adriamycin) What is this medicine? DOXORUBICIN (dox oh ROO bi sin) is a chemotherapy drug. It is used to treat many kinds of cancer like leukemia, lymphoma, neuroblastoma, sarcoma, and Wilms' tumor. It is also used to treat bladder cancer, breast cancer, lung cancer, ovarian cancer, stomach cancer, and thyroid cancer. This medicine may be used for other purposes; ask your health care provider or pharmacist if you have questions. COMMON BRAND NAME(S): Adriamycin, Adriamycin PFS, Adriamycin RDF, Rubex What should I tell my health care provider before I take this medicine? They need to know if you have any of these conditions:  heart disease  history of low blood counts caused by a medicine  liver disease  recent or ongoing radiation therapy  an unusual or allergic reaction to doxorubicin, other chemotherapy agents,  other medicines, foods, dyes, or preservatives  pregnant or trying to get pregnant  breast-feeding How should I use this medicine? This drug is given as an infusion into a vein. It is administered in a hospital or clinic by a specially trained health care professional. If you have pain, swelling, burning or any unusual feeling around the site of your injection, tell your health care professional right away. Talk to your pediatrician regarding the use of this medicine in children. Special care may be needed. Overdosage: If you think you have taken too much of this medicine contact a poison control center or emergency room at once. NOTE: This medicine is only for you. Do not share this medicine with others. What if I miss a dose? It is important not to miss your dose. Call your doctor or health care professional if you are unable to keep an appointment. What may interact with this medicine? This medicine may interact with the following medications:  6-mercaptopurine  paclitaxel  phenytoin  St. John's Wort  trastuzumab  verapamil This list may not describe all possible interactions. Give your health care provider a list of all the medicines, herbs, non-prescription drugs, or dietary supplements you use. Also tell them if you smoke, drink alcohol, or use illegal drugs. Some items may interact with your medicine. What should I watch for while using this medicine? This drug may make you feel generally unwell. This is not uncommon, as chemotherapy can affect healthy cells as well as cancer cells. Report any side effects. Continue your course of treatment even though you feel ill unless your doctor tells you to stop. There is a maximum amount  of this medicine you should receive throughout your life. The amount depends on the medical condition being treated and your overall health. Your doctor will watch how much of this medicine you receive in your lifetime. Tell your doctor if you have taken  this medicine before. You may need blood work done while you are taking this medicine. Your urine may turn red for a few days after your dose. This is not blood. If your urine is dark or brown, call your doctor. In some cases, you may be given additional medicines to help with side effects. Follow all directions for their use. Call your doctor or health care professional for advice if you get a fever, chills or sore throat, or other symptoms of a cold or flu. Do not treat yourself. This drug decreases your body's ability to fight infections. Try to avoid being around people who are sick. This medicine may increase your risk to bruise or bleed. Call your doctor or health care professional if you notice any unusual bleeding. Talk to your doctor about your risk of cancer. You may be more at risk for certain types of cancers if you take this medicine. Do not become pregnant while taking this medicine or for 6 months after stopping it. Women should inform their doctor if they wish to become pregnant or think they might be pregnant. Men should not father a child while taking this medicine and for 6 months after stopping it. There is a potential for serious side effects to an unborn child. Talk to your health care professional or pharmacist for more information. Do not breast-feed an infant while taking this medicine. This medicine has caused ovarian failure in some women and reduced sperm counts in some men This medicine may interfere with the ability to have a child. Talk with your doctor or health care professional if you are concerned about your fertility. This medicine may cause a decrease in Co-Enzyme Q-10. You should make sure that you get enough Co-Enzyme Q-10 while you are taking this medicine. Discuss the foods you eat and the vitamins you take with your health care professional. What side effects may I notice from receiving this medicine? Side effects that you should report to your doctor or health  care professional as soon as possible:  allergic reactions like skin rash, itching or hives, swelling of the face, lips, or tongue  breathing problems  chest pain  fast or irregular heartbeat  low blood counts - this medicine may decrease the number of white blood cells, red blood cells and platelets. You may be at increased risk for infections and bleeding.  pain, redness, or irritation at site where injected  signs of infection - fever or chills, cough, sore throat, pain or difficulty passing urine  signs of decreased platelets or bleeding - bruising, pinpoint red spots on the skin, black, tarry stools, blood in the urine  swelling of the ankles, feet, hands  tiredness  weakness Side effects that usually do not require medical attention (report to your doctor or health care professional if they continue or are bothersome):  diarrhea  hair loss  mouth sores  nail discoloration or damage  nausea  red colored urine  vomiting This list may not describe all possible side effects. Call your doctor for medical advice about side effects. You may report side effects to FDA at 1-800-FDA-1088. Where should I keep my medicine? This drug is given in a hospital or clinic and will not be stored at home.  NOTE: This sheet is a summary. It may not cover all possible information. If you have questions about this medicine, talk to your doctor, pharmacist, or health care provider.  2020 Elsevier/Gold Standard (2017-07-27 11:01:26)   Cyclophosphamide injection(Cytoxan) What is this medicine? CYCLOPHOSPHAMIDE (sye kloe FOSS fa mide) is a chemotherapy drug. It slows the growth of cancer cells. This medicine is used to treat many types of cancer like lymphoma, myeloma, leukemia, breast cancer, and ovarian cancer, to name a few. This medicine may be used for other purposes; ask your health care provider or pharmacist if you have questions. COMMON BRAND NAME(S): Cytoxan, Neosar What should  I tell my health care provider before I take this medicine? They need to know if you have any of these conditions:  blood disorders  history of other chemotherapy  infection  kidney disease  liver disease  recent or ongoing radiation therapy  tumors in the bone marrow  an unusual or allergic reaction to cyclophosphamide, other chemotherapy, other medicines, foods, dyes, or preservatives  pregnant or trying to get pregnant  breast-feeding How should I use this medicine? This drug is usually given as an injection into a vein or muscle or by infusion into a vein. It is administered in a hospital or clinic by a specially trained health care professional. Talk to your pediatrician regarding the use of this medicine in children. Special care may be needed. Overdosage: If you think you have taken too much of this medicine contact a poison control center or emergency room at once. NOTE: This medicine is only for you. Do not share this medicine with others. What if I miss a dose? It is important not to miss your dose. Call your doctor or health care professional if you are unable to keep an appointment. What may interact with this medicine? This medicine may interact with the following medications:  amiodarone  amphotericin B  azathioprine  certain antiviral medicines for HIV or AIDS such as protease inhibitors (e.g., indinavir, ritonavir) and zidovudine  certain blood pressure medications such as benazepril, captopril, enalapril, fosinopril, lisinopril, moexipril, monopril, perindopril, quinapril, ramipril, trandolapril  certain cancer medications such as anthracyclines (e.g., daunorubicin, doxorubicin), busulfan, cytarabine, paclitaxel, pentostatin, tamoxifen, trastuzumab  certain diuretics such as chlorothiazide, chlorthalidone, hydrochlorothiazide, indapamide, metolazone  certain medicines that treat or prevent blood clots like warfarin  certain muscle relaxants such as  succinylcholine  cyclosporine  etanercept  indomethacin  medicines to increase blood counts like filgrastim, pegfilgrastim, sargramostim  medicines used as general anesthesia  metronidazole  natalizumab This list may not describe all possible interactions. Give your health care provider a list of all the medicines, herbs, non-prescription drugs, or dietary supplements you use. Also tell them if you smoke, drink alcohol, or use illegal drugs. Some items may interact with your medicine. What should I watch for while using this medicine? Visit your doctor for checks on your progress. This drug may make you feel generally unwell. This is not uncommon, as chemotherapy can affect healthy cells as well as cancer cells. Report any side effects. Continue your course of treatment even though you feel ill unless your doctor tells you to stop. Drink water or other fluids as directed. Urinate often, even at night. In some cases, you may be given additional medicines to help with side effects. Follow all directions for their use. Call your doctor or health care professional for advice if you get a fever, chills or sore throat, or other symptoms of a cold or flu. Do  treat yourself. This drug decreases your body's ability to fight infections. Try to avoid being around people who are sick. This medicine may increase your risk to bruise or bleed. Call your doctor or health care professional if you notice any unusual bleeding. Be careful brushing and flossing your teeth or using a toothpick because you may get an infection or bleed more easily. If you have any dental work done, tell your dentist you are receiving this medicine. You may get drowsy or dizzy. Do not drive, use machinery, or do anything that needs mental alertness until you know how this medicine affects you. Do not become pregnant while taking this medicine or for 1 year after stopping it. Women should inform their doctor if they wish to  become pregnant or think they might be pregnant. Men should not father a child while taking this medicine and for 4 months after stopping it. There is a potential for serious side effects to an unborn child. Talk to your health care professional or pharmacist for more information. Do not breast-feed an infant while taking this medicine. This medicine may interfere with the ability to have a child. This medicine has caused ovarian failure in some women. This medicine has caused reduced sperm counts in some men. You should talk with your doctor or health care professional if you are concerned about your fertility. If you are going to have surgery, tell your doctor or health care professional that you have taken this medicine. What side effects may I notice from receiving this medicine? Side effects that you should report to your doctor or health care professional as soon as possible:  allergic reactions like skin rash, itching or hives, swelling of the face, lips, or tongue  low blood counts - this medicine may decrease the number of white blood cells, red blood cells and platelets. You may be at increased risk for infections and bleeding.  signs of infection - fever or chills, cough, sore throat, pain or difficulty passing urine  signs of decreased platelets or bleeding - bruising, pinpoint red spots on the skin, black, tarry stools, blood in the urine  signs of decreased red blood cells - unusually weak or tired, fainting spells, lightheadedness  breathing problems  dark urine  dizziness  palpitations  swelling of the ankles, feet, hands  trouble passing urine or change in the amount of urine  weight gain  yellowing of the eyes or skin Side effects that usually do not require medical attention (report to your doctor or health care professional if they continue or are bothersome):  changes in nail or skin color  hair loss  missed menstrual periods  mouth sores  nausea,  vomiting This list may not describe all possible side effects. Call your doctor for medical advice about side effects. You may report side effects to FDA at 1-800-FDA-1088. Where should I keep my medicine? This drug is given in a hospital or clinic and will not be stored at home. NOTE: This sheet is a summary. It may not cover all possible information. If you have questions about this medicine, talk to your doctor, pharmacist, or health care provider.  2020 Elsevier/Gold Standard (2012-10-27 16:22:58)  

## 2019-07-03 NOTE — Progress Notes (Signed)
[facility] CSW Progress Notes  Per [facility], patient has completed initial process for disability application.  They will notify us/patient w any needs.  [name], LCSW Clinical Social Worker Phone:  [phone]

## 2019-07-03 NOTE — Patient Instructions (Signed)
Doxorubicin injection What is this medicine? DOXORUBICIN (dox oh ROO bi sin) is a chemotherapy drug. It is used to treat many kinds of cancer like leukemia, lymphoma, neuroblastoma, sarcoma, and Wilms' tumor. It is also used to treat bladder cancer, breast cancer, lung cancer, ovarian cancer, stomach cancer, and thyroid cancer. This medicine may be used for other purposes; ask your health care provider or pharmacist if you have questions. COMMON BRAND NAME(S): Adriamycin, Adriamycin PFS, Adriamycin RDF, Rubex What should I tell my health care provider before I take this medicine? They need to know if you have any of these conditions:  heart disease  history of low blood counts caused by a medicine  liver disease  recent or ongoing radiation therapy  an unusual or allergic reaction to doxorubicin, other chemotherapy agents, other medicines, foods, dyes, or preservatives  pregnant or trying to get pregnant  breast-feeding How should I use this medicine? This drug is given as an infusion into a vein. It is administered in a hospital or clinic by a specially trained health care professional. If you have pain, swelling, burning or any unusual feeling around the site of your injection, tell your health care professional right away. Talk to your pediatrician regarding the use of this medicine in children. Special care may be needed. Overdosage: If you think you have taken too much of this medicine contact a poison control center or emergency room at once. NOTE: This medicine is only for you. Do not share this medicine with others. What if I miss a dose? It is important not to miss your dose. Call your doctor or health care professional if you are unable to keep an appointment. What may interact with this medicine? This medicine may interact with the following medications:  6-mercaptopurine  paclitaxel  phenytoin  St. John's Wort  trastuzumab  verapamil This list may not describe  all possible interactions. Give your health care provider a list of all the medicines, herbs, non-prescription drugs, or dietary supplements you use. Also tell them if you smoke, drink alcohol, or use illegal drugs. Some items may interact with your medicine. What should I watch for while using this medicine? This drug may make you feel generally unwell. This is not uncommon, as chemotherapy can affect healthy cells as well as cancer cells. Report any side effects. Continue your course of treatment even though you feel ill unless your doctor tells you to stop. There is a maximum amount of this medicine you should receive throughout your life. The amount depends on the medical condition being treated and your overall health. Your doctor will watch how much of this medicine you receive in your lifetime. Tell your doctor if you have taken this medicine before. You may need blood work done while you are taking this medicine. Your urine may turn red for a few days after your dose. This is not blood. If your urine is dark or brown, call your doctor. In some cases, you may be given additional medicines to help with side effects. Follow all directions for their use. Call your doctor or health care professional for advice if you get a fever, chills or sore throat, or other symptoms of a cold or flu. Do not treat yourself. This drug decreases your body's ability to fight infections. Try to avoid being around people who are sick. This medicine may increase your risk to bruise or bleed. Call your doctor or health care professional if you notice any unusual bleeding. Talk to your doctor   about your risk of cancer. You may be more at risk for certain types of cancers if you take this medicine. Do not become pregnant while taking this medicine or for 6 months after stopping it. Women should inform their doctor if they wish to become pregnant or think they might be pregnant. Men should not father a child while taking this  medicine and for 6 months after stopping it. There is a potential for serious side effects to an unborn child. Talk to your health care professional or pharmacist for more information. Do not breast-feed an infant while taking this medicine. This medicine has caused ovarian failure in some women and reduced sperm counts in some men This medicine may interfere with the ability to have a child. Talk with your doctor or health care professional if you are concerned about your fertility. This medicine may cause a decrease in Co-Enzyme Q-10. You should make sure that you get enough Co-Enzyme Q-10 while you are taking this medicine. Discuss the foods you eat and the vitamins you take with your health care professional. What side effects may I notice from receiving this medicine? Side effects that you should report to your doctor or health care professional as soon as possible:  allergic reactions like skin rash, itching or hives, swelling of the face, lips, or tongue  breathing problems  chest pain  fast or irregular heartbeat  low blood counts - this medicine may decrease the number of white blood cells, red blood cells and platelets. You may be at increased risk for infections and bleeding.  pain, redness, or irritation at site where injected  signs of infection - fever or chills, cough, sore throat, pain or difficulty passing urine  signs of decreased platelets or bleeding - bruising, pinpoint red spots on the skin, black, tarry stools, blood in the urine  swelling of the ankles, feet, hands  tiredness  weakness Side effects that usually do not require medical attention (report to your doctor or health care professional if they continue or are bothersome):  diarrhea  hair loss  mouth sores  nail discoloration or damage  nausea  red colored urine  vomiting This list may not describe all possible side effects. Call your doctor for medical advice about side effects. You may report  side effects to FDA at 1-800-FDA-1088. Where should I keep my medicine? This drug is given in a hospital or clinic and will not be stored at home. NOTE: This sheet is a summary. It may not cover all possible information. If you have questions about this medicine, talk to your doctor, pharmacist, or health care provider.  2020 Elsevier/Gold Standard (2017-07-27 11:01:26) Cyclophosphamide injection What is this medicine? CYCLOPHOSPHAMIDE (sye kloe FOSS fa mide) is a chemotherapy drug. It slows the growth of cancer cells. This medicine is used to treat many types of cancer like lymphoma, myeloma, leukemia, breast cancer, and ovarian cancer, to name a few. This medicine may be used for other purposes; ask your health care provider or pharmacist if you have questions. COMMON BRAND NAME(S): Cytoxan, Neosar What should I tell my health care provider before I take this medicine? They need to know if you have any of these conditions:  blood disorders  history of other chemotherapy  infection  kidney disease  liver disease  recent or ongoing radiation therapy  tumors in the bone marrow  an unusual or allergic reaction to cyclophosphamide, other chemotherapy, other medicines, foods, dyes, or preservatives  pregnant or trying to get  pregnant  breast-feeding How should I use this medicine? This drug is usually given as an injection into a vein or muscle or by infusion into a vein. It is administered in a hospital or clinic by a specially trained health care professional. Talk to your pediatrician regarding the use of this medicine in children. Special care may be needed. Overdosage: If you think you have taken too much of this medicine contact a poison control center or emergency room at once. NOTE: This medicine is only for you. Do not share this medicine with others. What if I miss a dose? It is important not to miss your dose. Call your doctor or health care professional if you are  unable to keep an appointment. What may interact with this medicine? This medicine may interact with the following medications:  amiodarone  amphotericin B  azathioprine  certain antiviral medicines for HIV or AIDS such as protease inhibitors (e.g., indinavir, ritonavir) and zidovudine  certain blood pressure medications such as benazepril, captopril, enalapril, fosinopril, lisinopril, moexipril, monopril, perindopril, quinapril, ramipril, trandolapril  certain cancer medications such as anthracyclines (e.g., daunorubicin, doxorubicin), busulfan, cytarabine, paclitaxel, pentostatin, tamoxifen, trastuzumab  certain diuretics such as chlorothiazide, chlorthalidone, hydrochlorothiazide, indapamide, metolazone  certain medicines that treat or prevent blood clots like warfarin  certain muscle relaxants such as succinylcholine  cyclosporine  etanercept  indomethacin  medicines to increase blood counts like filgrastim, pegfilgrastim, sargramostim  medicines used as general anesthesia  metronidazole  natalizumab This list may not describe all possible interactions. Give your health care provider a list of all the medicines, herbs, non-prescription drugs, or dietary supplements you use. Also tell them if you smoke, drink alcohol, or use illegal drugs. Some items may interact with your medicine. What should I watch for while using this medicine? Visit your doctor for checks on your progress. This drug may make you feel generally unwell. This is not uncommon, as chemotherapy can affect healthy cells as well as cancer cells. Report any side effects. Continue your course of treatment even though you feel ill unless your doctor tells you to stop. Drink water or other fluids as directed. Urinate often, even at night. In some cases, you may be given additional medicines to help with side effects. Follow all directions for their use. Call your doctor or health care professional for advice if  you get a fever, chills or sore throat, or other symptoms of a cold or flu. Do not treat yourself. This drug decreases your body's ability to fight infections. Try to avoid being around people who are sick. This medicine may increase your risk to bruise or bleed. Call your doctor or health care professional if you notice any unusual bleeding. Be careful brushing and flossing your teeth or using a toothpick because you may get an infection or bleed more easily. If you have any dental work done, tell your dentist you are receiving this medicine. You may get drowsy or dizzy. Do not drive, use machinery, or do anything that needs mental alertness until you know how this medicine affects you. Do not become pregnant while taking this medicine or for 1 year after stopping it. Women should inform their doctor if they wish to become pregnant or think they might be pregnant. Men should not father a child while taking this medicine and for 4 months after stopping it. There is a potential for serious side effects to an unborn child. Talk to your health care professional or pharmacist for more information. Do not breast-feed  an infant while taking this medicine. This medicine may interfere with the ability to have a child. This medicine has caused ovarian failure in some women. This medicine has caused reduced sperm counts in some men. You should talk with your doctor or health care professional if you are concerned about your fertility. If you are going to have surgery, tell your doctor or health care professional that you have taken this medicine. What side effects may I notice from receiving this medicine? Side effects that you should report to your doctor or health care professional as soon as possible:  allergic reactions like skin rash, itching or hives, swelling of the face, lips, or tongue  low blood counts - this medicine may decrease the number of white blood cells, red blood cells and platelets. You may be  at increased risk for infections and bleeding.  signs of infection - fever or chills, cough, sore throat, pain or difficulty passing urine  signs of decreased platelets or bleeding - bruising, pinpoint red spots on the skin, black, tarry stools, blood in the urine  signs of decreased red blood cells - unusually weak or tired, fainting spells, lightheadedness  breathing problems  dark urine  dizziness  palpitations  swelling of the ankles, feet, hands  trouble passing urine or change in the amount of urine  weight gain  yellowing of the eyes or skin Side effects that usually do not require medical attention (report to your doctor or health care professional if they continue or are bothersome):  changes in nail or skin color  hair loss  missed menstrual periods  mouth sores  nausea, vomiting This list may not describe all possible side effects. Call your doctor for medical advice about side effects. You may report side effects to FDA at 1-800-FDA-1088. Where should I keep my medicine? This drug is given in a hospital or clinic and will not be stored at home. NOTE: This sheet is a summary. It may not cover all possible information. If you have questions about this medicine, talk to your doctor, pharmacist, or health care provider.  2020 Elsevier/Gold Standard (2012-10-27 16:22:58)

## 2019-07-04 ENCOUNTER — Telehealth: Payer: Self-pay | Admitting: Adult Health

## 2019-07-04 ENCOUNTER — Encounter: Payer: Self-pay | Admitting: General Practice

## 2019-07-04 LAB — VIRUS CULTURE

## 2019-07-04 NOTE — Telephone Encounter (Signed)
I left a message regarding schedule  

## 2019-07-04 NOTE — Progress Notes (Signed)
[facility] Progress Notes  Phone call w Terri Cliche, RN CM for patient's children.  Discussed patient's multiple psychosocial needs, including medical care needs of children.  Patient has not found alternative housing at this point - continues to look.  Children's appointments are more on schedule, but one/more of children may require more significant medical treatments.  There are no current services available to meet the childrens needs in the home.  Patient takes care of most if not all household tasks including child care, housekeeping, food preparation and similar.  Appears to be quite stressed and overwhelmed by the burden of caregiving on her, but family members have not been able to step in on a consistent basis.  Will continue to encourage patient to continue to look for housing.  RN CM is having consistent contact w patient and her children.  Terri Shell, LCSW Clinical Social Worker Phone:  [phone]

## 2019-07-05 ENCOUNTER — Other Ambulatory Visit: Payer: Self-pay | Admitting: Adult Health

## 2019-07-05 ENCOUNTER — Inpatient Hospital Stay: Payer: Medicaid Other

## 2019-07-05 ENCOUNTER — Other Ambulatory Visit: Payer: Self-pay

## 2019-07-05 ENCOUNTER — Encounter: Payer: Self-pay | Admitting: General Practice

## 2019-07-05 NOTE — Progress Notes (Signed)
Injection appt was not canceled for pt. Onpro Neulasta was administered and off at 7pm 07/04/2019, stated patient. This nurse explained appt error. Pt understood. Terri Boyland LPN

## 2019-07-05 NOTE — Progress Notes (Signed)
[place] CSW Progress Notes  Proof of filing for disability received from [facility].  Patient's case is consider a TERI case for expedited review.  [name], LCSW Clinical Social Worker Phone:  [phone]

## 2019-07-10 ENCOUNTER — Other Ambulatory Visit: Payer: Medicaid Other

## 2019-07-11 ENCOUNTER — Other Ambulatory Visit: Payer: Self-pay

## 2019-07-11 ENCOUNTER — Encounter: Payer: Self-pay | Admitting: Adult Health

## 2019-07-11 ENCOUNTER — Telehealth: Payer: Self-pay | Admitting: *Deleted

## 2019-07-11 ENCOUNTER — Inpatient Hospital Stay (HOSPITAL_BASED_OUTPATIENT_CLINIC_OR_DEPARTMENT_OTHER): Payer: Medicaid Other | Admitting: Adult Health

## 2019-07-11 ENCOUNTER — Inpatient Hospital Stay: Payer: Medicaid Other | Admitting: Adult Health

## 2019-07-11 ENCOUNTER — Inpatient Hospital Stay: Payer: Medicaid Other

## 2019-07-11 VITALS — BP 105/87 | HR 153 | Temp 98.7°F | Resp 18 | Ht 60.0 in

## 2019-07-11 DIAGNOSIS — R000 Tachycardia, unspecified: Secondary | ICD-10-CM

## 2019-07-11 DIAGNOSIS — G620 Drug-induced polyneuropathy: Secondary | ICD-10-CM

## 2019-07-11 DIAGNOSIS — C50811 Malignant neoplasm of overlapping sites of right female breast: Secondary | ICD-10-CM | POA: Diagnosis not present

## 2019-07-11 DIAGNOSIS — Z87891 Personal history of nicotine dependence: Secondary | ICD-10-CM

## 2019-07-11 DIAGNOSIS — D701 Agranulocytosis secondary to cancer chemotherapy: Secondary | ICD-10-CM

## 2019-07-11 DIAGNOSIS — E860 Dehydration: Secondary | ICD-10-CM

## 2019-07-11 DIAGNOSIS — R300 Dysuria: Secondary | ICD-10-CM

## 2019-07-11 DIAGNOSIS — Z171 Estrogen receptor negative status [ER-]: Secondary | ICD-10-CM | POA: Diagnosis not present

## 2019-07-11 DIAGNOSIS — G8929 Other chronic pain: Secondary | ICD-10-CM

## 2019-07-11 DIAGNOSIS — C773 Secondary and unspecified malignant neoplasm of axilla and upper limb lymph nodes: Secondary | ICD-10-CM | POA: Diagnosis not present

## 2019-07-11 DIAGNOSIS — R200 Anesthesia of skin: Secondary | ICD-10-CM

## 2019-07-11 DIAGNOSIS — Z5111 Encounter for antineoplastic chemotherapy: Secondary | ICD-10-CM | POA: Diagnosis not present

## 2019-07-11 DIAGNOSIS — B3781 Candidal esophagitis: Secondary | ICD-10-CM

## 2019-07-11 DIAGNOSIS — Z79899 Other long term (current) drug therapy: Secondary | ICD-10-CM

## 2019-07-11 DIAGNOSIS — Z803 Family history of malignant neoplasm of breast: Secondary | ICD-10-CM

## 2019-07-11 DIAGNOSIS — B370 Candidal stomatitis: Secondary | ICD-10-CM

## 2019-07-11 DIAGNOSIS — J45909 Unspecified asthma, uncomplicated: Secondary | ICD-10-CM

## 2019-07-11 DIAGNOSIS — R112 Nausea with vomiting, unspecified: Secondary | ICD-10-CM

## 2019-07-11 LAB — COMPREHENSIVE METABOLIC PANEL
ALT: 33 U/L (ref 0–44)
AST: 10 U/L — ABNORMAL LOW (ref 15–41)
Albumin: 3.7 g/dL (ref 3.5–5.0)
Alkaline Phosphatase: 79 U/L (ref 38–126)
Anion gap: 15 (ref 5–15)
BUN: 11 mg/dL (ref 6–20)
CO2: 24 mmol/L (ref 22–32)
Calcium: 10.2 mg/dL (ref 8.9–10.3)
Chloride: 92 mmol/L — ABNORMAL LOW (ref 98–111)
Creatinine, Ser: 0.89 mg/dL (ref 0.44–1.00)
GFR calc Af Amer: >60 mL/min (ref >60)
GFR calc non Af Amer: >60 mL/min (ref >60)
Glucose, Bld: 116 mg/dL — ABNORMAL HIGH (ref 70–99)
Potassium: 4.3 mmol/L (ref 3.5–5.1)
Sodium: 131 mmol/L — ABNORMAL LOW (ref 135–145)
Total Bilirubin: 2.3 mg/dL — ABNORMAL HIGH (ref 0.3–1.2)
Total Protein: 8.2 g/dL — ABNORMAL HIGH (ref 6.5–8.1)

## 2019-07-11 LAB — CBC WITH DIFFERENTIAL/PLATELET
Abs Immature Granulocytes: 0 10*3/uL (ref 0.00–0.07)
Basophils Absolute: 0 10*3/uL (ref 0.0–0.1)
Basophils Relative: 0 %
Eosinophils Absolute: 0 10*3/uL (ref 0.0–0.5)
Eosinophils Relative: 0 %
HCT: 32.5 % — ABNORMAL LOW (ref 36.0–46.0)
Hemoglobin: 11.1 g/dL — ABNORMAL LOW (ref 12.0–15.0)
Immature Granulocytes: 0 %
Lymphocytes Relative: 89 %
Lymphs Abs: 0.3 10*3/uL — ABNORMAL LOW (ref 0.7–4.0)
MCH: 31.7 pg (ref 26.0–34.0)
MCHC: 34.2 g/dL (ref 30.0–36.0)
MCV: 92.9 fL (ref 80.0–100.0)
Monocytes Absolute: 0 10*3/uL — ABNORMAL LOW (ref 0.1–1.0)
Monocytes Relative: 7 %
Neutro Abs: 0 10*3/uL — CL (ref 1.7–7.7)
Neutrophils Relative %: 4 %
Platelets: 178 10*3/uL (ref 150–400)
RBC: 3.5 MIL/uL — ABNORMAL LOW (ref 3.87–5.11)
RDW: 17.1 % — ABNORMAL HIGH (ref 11.5–15.5)
WBC: 0.3 10*3/uL — CL (ref 4.0–10.5)
nRBC: 0 % (ref 0.0–0.2)

## 2019-07-11 MED ORDER — FLUCONAZOLE 200 MG PO TABS: 200.0000 mg | ORAL_TABLET | Freq: Every day | ORAL | 0 refills | Status: DC

## 2019-07-11 MED ORDER — SODIUM CHLORIDE 0.9% FLUSH
10.0000 mL | INTRAVENOUS | Status: DC | PRN
  Administered 2019-07-11: 10 mL
  Filled 2019-07-11: qty 10

## 2019-07-11 MED ORDER — FLUCONAZOLE IN SODIUM CHLORIDE 400-0.9 MG/200ML-% IV SOLN
400.0000 mg | Freq: Once | INTRAVENOUS | Status: AC
  Administered 2019-07-11: 13:00:00 400 mg via INTRAVENOUS
  Filled 2019-07-11: qty 200

## 2019-07-11 MED ORDER — ONDANSETRON HCL 4 MG/2ML IJ SOLN
8.0000 mg | Freq: Once | INTRAMUSCULAR | Status: AC
  Administered 2019-07-11: 8 mg via INTRAVENOUS

## 2019-07-11 MED ORDER — ONDANSETRON HCL 4 MG/2ML IJ SOLN
INTRAMUSCULAR | Status: AC
  Filled 2019-07-11: qty 4

## 2019-07-11 MED ORDER — CIPROFLOXACIN HCL 500 MG PO TABS: 500.0000 mg | ORAL_TABLET | Freq: Two times a day (BID) | ORAL | 0 refills | Status: DC

## 2019-07-11 MED ORDER — SODIUM CHLORIDE 0.9 % IV SOLN
INTRAVENOUS | Status: AC
  Administered 2019-07-11: 12:00:00 via INTRAVENOUS
  Filled 2019-07-11 (×2): qty 250

## 2019-07-11 MED ORDER — HEPARIN SOD (PORK) LOCK FLUSH 100 UNIT/ML IV SOLN
500.0000 [IU] | Freq: Once | INTRAVENOUS | Status: DC | PRN
  Filled 2019-07-11: qty 5

## 2019-07-11 NOTE — Progress Notes (Signed)
[place]  [phone] [phone]    ID: Terri Estes DOB: [birth year]  MR#: [id number]  PJK#:[id number]  Patient Care Team: Patient, No Pcp Per as PCP - General (General Practice) Alphonsa Overall, MD as Consulting Physician (General Surgery) Magrinat, [name], MD as Consulting Physician (Oncology) Kyung Rudd, MD as Consulting Physician (Radiation Oncology) Rockwell Germany, RN as Oncology Nurse Navigator Mauro Kaufmann, RN as Oncology Nurse Navigator Donnamae Jude, MD as Consulting Physician (Obstetrics and Gynecology) Pavelock, Ralene Bathe, MD as Consulting Physician (Internal Medicine) OTHER MD: Joylene Igo for [facility] is [phone]   CHIEF COMPLAINT: Triple negative breast cancer  CURRENT TREATMENT: Neoadjuvant chemotherapy[/immunotherapy]/goserelin   INTERVAL HISTORY: Terri Estes returns today for follow-up and treatment of her estrogen negative breast cancer.  She stopped her neoadjuvant Paclitaxel and Carboplatin after 5 cycles due to new onset peripheral neuropathy and has gone onto receive Doxorubicin and Cyclophosphamide with onpro support given on day 1 of a 14 day cycle.  Today is cycle 1 day 8.    She is also receiving goserelin every 28 days, with her most recent dose given on 07/03/2019.  She is due for another dose today.   REVIEW OF SYSTEMS: Terri Estes was delayed to her appointment today because she notes that she was feeling very poorly this morning.  She says that she started feeling bad two days ago.  She notes that her body is aching.  She notes that she is having difficulty swallowing.  She says that she hasn't been able to take any medication due to this.  She is having throat and right ear pain inside her ear.  She has a mild cough.  She says it is productive of white mucous.  She denies any shortness of breath or chest pain.  She has not had any fever or chills.  She notes she will sometimes feel cold, and then hot.  She  has been nauseated and has vomited once.  She is unable to take her anti nausea medication due to her difficulty swallowing.  She continues to have a mild dysuria.  She continues on Valtrex BID.  She was unable to undergo breast ultrasound because she was too sick to get there.  She feels very weak from her treatment.  Terri Estes notes a continued mild intermittent peripheral neuropathy.    HISTORY OF CURRENT ILLNESS: From the original intake note:  Terri Estes presented with swelling along the left side of the face with neck pain. She then underwent a neck CT on 04/11/2019 showing: Enlarged left level 2 lymph nodes with associated inflammatory change including stranding of the adjacent fat and thickening of the platysma. While malignancy is not excluded, this is most concerning for infection. No focal abscess is present. Hypoattenuation within 1 of the left level 2 lymph nodes likely represents central necrosis or suppuration of the node. No primary malignancy or other abscess. Left upper lobe pulmonary nodules may be inflammatory. The largest measures 0.7 x 0.7 x 0.6 cm. CT of the chest with contrast may be useful for further evaluation.  She also underwent a chest CT on the same day showing: Complex right breast mass measuring 5.6 x 4.5 x 4.7 cm consistent with a primary breast carcinoma. Malignant right axillary adenopathy measuring up to 3.1 cm. Multiple bilateral pulmonary nodules are concerning for metastatic disease. Given the right breast mass, the left cervical lymph nodes are more likely malignant.  She then underwent bilateral diagnostic mammography with tomography and right  breast ultrasonography at [facility] on 04/19/2019 showing: Breast Density Category C; findings which are highly suspicious for multicentric inflammatory right breast cancer with right axillary nodal metastatic disease; no mammographic evidence of malignancy involving the left breast.  Accordingly on 04/19/2019  she proceeded to biopsy of the right breast area in question. The pathology from this procedure showed ([id number]): invasive ductal carcinoma, grade III, upper inner quadrant, 12:30 o'clock, 5.0 cm from the nipple. Prognostic indicators significant for: estrogen receptor, 0% negative and progesterone receptor, 0% negative. Proliferation marker Ki67 at 90%. HER2 negative (0+).  Additional biopsies of the right breast and right axilla were performed on the same day. The pathology from this procedure showed ([id number]): 2. Breast, right, needle core biopsy, satellite mass UOQ, 10 o'clock, 8cmfn  - Invasive ductal carcinoma, grade III 3. Lymph node, needle/core biopsy, level 1 right axilla   - Invasive ductal carcinoma, grade III  The patient's subsequent history is as detailed below.   PAST MEDICAL HISTORY: Past Medical History:  Diagnosis Date   Anemia    after pregnancy   Anxiety    severe not taking meds   Asthma    inhaler  4x per day   Chronic pain    Family history of breast cancer    History of substance abuse ([place])    Pregnancy induced hypertension    takes medication now.   Psoriasis    Trichomonas infection    Vaginal Pap smear, abnormal      PAST SURGICAL HISTORY: Past Surgical History:  Procedure Laterality Date   CESAREAN SECTION  05/26/2008   CESAREAN SECTION N/A 07/10/2017   Procedure: CESAREAN SECTION;  Surgeon: Florian Buff, MD;  Location: [facility];  Service: Obstetrics;  Laterality: N/A;   CESAREAN SECTION N/A 09/23/2018   Procedure: CESAREAN SECTION;  Surgeon: Donnamae Jude, MD;  Location: [facility];  Service: Obstetrics;  Laterality: N/A;   COLPOSCOPY     PORTACATH PLACEMENT Left 04/30/2019   Procedure: INSERTION PORT-A-CATH WITH ULTRASOUND;  Surgeon: Alphonsa Overall, MD;  Location: [facility];  Service: General;  Laterality: Left;  Left Subclavian vein   WISDOM TOOTH EXTRACTION       FAMILY HISTORY: Family History    Problem Relation Age of Onset   Kidney disease Maternal Grandmother    Breast cancer Maternal Grandmother 28       metastatic to liver; d. 32   Kidney disease Maternal Grandfather    Kidney disease Paternal Grandmother    Diabetes Paternal Grandmother    Breast cancer Paternal Grandmother    Kidney disease Paternal Grandfather    Breast cancer Paternal Aunt    Cerebral palsy Child    Breast cancer Paternal Great-grandmother    Pancreatic cancer Other        PGMs brother    Throat cancer Paternal Uncle    Breast cancer Other        PGMs sister  (Updated 04/25/2019) Javanna's father is living at age 33. Patients' mother is also living at age 22. [place] notes, however, that she was raised by her grandmother.) The patient has 0 brothers and 5 sisters. Patient denies anyone in her family having ovarian, prostate, or pancreatic cancer. Her maternal grandmother was diagnosed with breast cancer, unsure what age. On her father's side, she reports her grandmother had cysts in her breasts, her great-aunt might have had breast cancer, and her great-grandmother was diagnosed with breast cancer at an older age.   GYNECOLOGIC HISTORY:  No LMP recorded. (Menstrual status: Chemotherapy).  She had an emergent C-section in 09/23/2018 for abruptio placenta at 30 weeks pregnancy, also has a history of eclampsia Menarche:    years old Age at first live birth: 33 years old [place] P: 3 LMP: 04/18/2019 Contraceptive: none HRT: none  Hysterectomy?: no BSO?: no   SOCIAL HISTORY: (Current as of 04/25/2019) Terri Estes is currently unemployed. She previously worked at [employer]. She is currently engaged. Fiance [name] runs a Midwife and works other odd jobs. Daughter Demetrius Charity, age 34, was abused by her father at 51 months and has cerebral palsy. Son [name], age 61, has severe asthma and is developmentally disabled. Daughter Yetta Glassman was born on 09/23/2018 prematurely and remains on  oxygen at home.    ADVANCED DIRECTIVES: not in place.    HEALTH MAINTENANCE: Social History   Tobacco Use   Smoking status: Former Smoker    Packs/day: 0.50    Years: 5.00    Pack years: 2.50    Types: Cigarettes    Quit date: 04/10/2019    Years since quitting: 0.2   Smokeless tobacco: Never Used   Tobacco comment: trying to quit  Substance Use Topics   Alcohol use: No   Drug use: Yes    Frequency: 7.0 times per week    Types: Marijuana    Comment: subutex    Colonoscopy: never done  PAP: 05/10/2018  Bone density: never done Mammography: first performed following abnormal CT  No Known Allergies  Current Outpatient Medications  Medication Sig Dispense Refill   acyclovir ointment (ZOVIRAX) 5 % Apply 1 application topically 2 (two) times daily as needed. Mix 1 part acyclovir cream with 1 part over the counter hydrocortisone 1% cream and apply to affected area two times a day as needed 5 g 0   albuterol (PROVENTIL HFA;VENTOLIN HFA) 108 (90 Base) MCG/ACT inhaler Inhale 2 puffs into the lungs 4 (four) times daily. 1 Inhaler 2   buprenorphine (SUBUTEX) 8 MG SUBL SL tablet Place 8 mg under the tongue 2 (two) times daily.      cholestyramine (QUESTRAN) 4 g packet Take 1 packet (4 g total) by mouth 3 (three) times daily with meals. 60 each 12   cyclobenzaprine (FLEXERIL) 10 MG tablet Take 1 tablet (10 mg total) by mouth 3 (three) times daily as needed for muscle spasms. 90 tablet 2   dexamethasone (DECADRON) 4 MG tablet TAKE 2 TABLETS BY MOUTH DAILY ON THE DAY AFTER CHEMOTHERAPY, AND THEN TAKE 2 TABLETS TWICE DAILY FOR 2 DAYS AFTERWARDS. TAKE WITH FOOD 30 tablet 1   enalapril (VASOTEC) 10 MG tablet TAKE 1 TABLET BY MOUTH DAILY 90 tablet 0   gabapentin (NEURONTIN) 300 MG capsule Take 1 capsule (300 mg total) by mouth 3 (three) times daily. 90 capsule 0   omeprazole (PRILOSEC) 40 MG capsule Take 1 capsule (40 mg total) by mouth daily. 30 capsule 5   ondansetron (ZOFRAN) 8  MG tablet TAKE 1 TABLET(8 MG) BY MOUTH EVERY 8 HOURS AS NEEDED FOR NAUSEA OR VOMITING 20 tablet 0   PARoxetine (PAXIL) 10 MG tablet Take 1 tablet (10 mg total) by mouth daily. (Patient taking differently: Take 10 mg by mouth daily. Patient is taking 2 tablets daily per MD) 30 tablet 5   Prenatal MV-Min-FA-Omega-3 (PRENATAL GUMMIES/DHA & FA) 0.4-32.5 MG CHEW Chew 2 each by mouth daily.     prochlorperazine (COMPAZINE) 10 MG tablet TAKE 1 TABLET(10 MG) BY MOUTH EVERY 6 HOURS AS NEEDED  FOR NAUSEA OR VOMITING 30 tablet 1   valACYclovir (VALTREX) 1000 MG tablet Take 1 tablet (1,000 mg total) by mouth 2 (two) times daily. 60 tablet 0   ciprofloxacin (CIPRO) 500 MG tablet Take 1 tablet (500 mg total) by mouth 2 (two) times daily. 14 tablet 0   fluconazole (DIFLUCAN) 200 MG tablet Take 1 tablet (200 mg total) by mouth daily. 30 tablet 0   Current Facility-Administered Medications  Medication Dose Route Frequency Provider Last Rate Last Dose   0.9 %  sodium chloride infusion   Intravenous Continuous Damiya Sandefur, Charlestine Massed, NP       ondansetron (ZOFRAN) injection 8 mg  8 mg Intravenous Once Seletha Zimmermann, Charlestine Massed, NP       Facility-Administered Medications Ordered in Other Visits  Medication Dose Route Frequency Provider Last Rate Last Dose   0.9 % NaCl with KCl 20 mEq/ L  infusion   Intravenous Once Magrinat, [name], MD       sodium chloride flush (NS) 0.9 % injection 10 mL  10 mL Intracatheter PRN Magrinat, [name], MD         OBJECTIVE:   Vitals:   07/11/19 1051  BP: 105/87  Pulse: (!) 153  Resp: 18  Temp: 98.7 F (37.1 C)  SpO2: 99%     Body mass index is 40.33 kg/m.   Wt Readings from Last 3 Encounters:  07/03/19 206 lb 8 oz (93.7 kg)  06/26/19 208 lb 9.6 oz (94.6 kg)  06/19/19 198 lb 11.2 oz (90.1 kg)  ECOG FS:1 - Symptomatic but completely ambulatory GENERAL: Patient is a tired appearing, unwell patient.  Non toxic HEENT:  Sclerae anicteric.  Thrush coating entire  tongue and posterior pharynx.  NODES:  No cervical, supraclavicular, or axillary lymphadenopathy palpated.  BREAST EXAM:  deferred LUNGS:  Clear to auscultation bilaterally.  No wheezes or rhonchi. HEART:  Tachycardic, regular rhythm. No murmur appreciated. ABDOMEN:  Soft, nontender.  Positive, normoactive bowel sounds. No organomegaly palpated. MSK:  No focal spinal tenderness to palpation. Full range of motion bilaterally in the upper extremities. EXTREMITIES:  No peripheral edema.   SKIN:  Clear with no obvious rashes or skin changes. No nail dyscrasia. NEURO:  Nonfocal. Well oriented.  Appropriate affect.       LAB RESULTS:  CMP     Component Value Date/Time   NA 131 (L) 07/11/2019 1039   NA 137 05/10/2018 1535   K 4.3 07/11/2019 1039   CL 92 (L) 07/11/2019 1039   CO2 24 07/11/2019 1039   GLUCOSE 116 (H) 07/11/2019 1039   BUN 11 07/11/2019 1039   BUN 7 05/10/2018 1535   CREATININE 0.89 07/11/2019 1039   CREATININE 0.81 07/03/2019 0919   CALCIUM 10.2 07/11/2019 1039   PROT 8.2 (H) 07/11/2019 1039   PROT 7.2 05/10/2018 1535   ALBUMIN 3.7 07/11/2019 1039   ALBUMIN 4.2 05/10/2018 1535   AST 10 (L) 07/11/2019 1039   AST 54 (H) 07/03/2019 0919   ALT 33 07/11/2019 1039   ALT 102 (H) 07/03/2019 0919   ALKPHOS 79 07/11/2019 1039   BILITOT 2.3 (H) 07/11/2019 1039   BILITOT <0.2 (L) 07/03/2019 0919   GFRNONAA >60 07/11/2019 1039   GFRNONAA >60 07/03/2019 0919   GFRAA >60 07/11/2019 1039   GFRAA >60 07/03/2019 0919    No results found for: TOTALPROTELP, ALBUMINELP, A1GS, A2GS, BETS, BETA2SER, GAMS, MSPIKE, SPEI  No results found for: KPAFRELGTCHN, LAMBDASER, KAPLAMBRATIO  Lab Results  Component Value Date  WBC 0.3 (LL) 07/11/2019   NEUTROABS 0.0 (LL) 07/11/2019   HGB 11.1 (L) 07/11/2019   HCT 32.5 (L) 07/11/2019   MCV 92.9 07/11/2019   PLT 178 07/11/2019    '@LASTCHEMISTRY' @  No results found for: LABCA2  No components found for: [id number]  No results for  input(s): INR in the last 168 hours.  No results found for: LABCA2  No results found for: JSR159  No results found for: YVO592  No results found for: TWK462  No results found for: CA2729  No components found for: HGQUANT  No results found for: CEA1 / No results found for: CEA1   No results found for: AFPTUMOR  No results found for: CHROMOGRNA  No results found for: PSA1  Appointment on 07/11/2019  Component Date Value Ref Range Status   Sodium 07/11/2019 131* 135 - 145 mmol/L Final   Potassium 07/11/2019 4.3  3.5 - 5.1 mmol/L Final   Chloride 07/11/2019 92* 98 - 111 mmol/L Final   CO2 07/11/2019 24  22 - 32 mmol/L Final   Glucose, Bld 07/11/2019 116* 70 - 99 mg/dL Final   BUN 07/11/2019 11  6 - 20 mg/dL Final   Creatinine, Ser 07/11/2019 0.89  0.44 - 1.00 mg/dL Final   Calcium 07/11/2019 10.2  8.9 - 10.3 mg/dL Final   Total Protein 07/11/2019 8.2* 6.5 - 8.1 g/dL Final   Albumin 07/11/2019 3.7  3.5 - 5.0 g/dL Final   AST 07/11/2019 10* 15 - 41 U/L Final   ALT 07/11/2019 33  0 - 44 U/L Final   Alkaline Phosphatase 07/11/2019 79  38 - 126 U/L Final   Total Bilirubin 07/11/2019 2.3* 0.3 - 1.2 mg/dL Final   GFR calc non Af Amer 07/11/2019 >60  >60 mL/min Final   GFR calc Af Amer 07/11/2019 >60  >60 mL/min Final   Anion gap 07/11/2019 15  5 - 15 Final   Performed at [facility], [place] [address] [place], [place] [address]   WBC 07/11/2019 0.3* 4.0 - 10.5 K/uL Final   Comment: This critical result has verified and been called to [place] by Rolland Porter on 07 15 2020 at 1059, and has been read back.  This critical result has verified and been called to [place], RN by Rolland Porter on 07 15 2020 at 1101, and has been read back.     RBC 07/11/2019 3.50* 3.87 - 5.11 MIL/uL Final   Hemoglobin 07/11/2019 11.1* 12.0 - 15.0 g/dL Final   HCT 07/11/2019 32.5* 36.0 - 46.0 % Final   MCV 07/11/2019 92.9  80.0 - 100.0 fL Final     MCH 07/11/2019 31.7  26.0 - 34.0 pg Final   MCHC 07/11/2019 34.2  30.0 - 36.0 g/dL Final   RDW 07/11/2019 17.1* 11.5 - 15.5 % Final   Platelets 07/11/2019 178  150 - 400 K/uL Final   nRBC 07/11/2019 0.0  0.0 - 0.2 % Final   Neutrophils Relative % 07/11/2019 4  % Final   Neutro Abs 07/11/2019 0.0* 1.7 - 7.7 K/uL Final   This critical result has verified and been called to [place] by Dorian Furnace on 07 15 2020 at 1123, and has been read back.    Lymphocytes Relative 07/11/2019 89  % Final   Lymphs Abs 07/11/2019 0.3* 0.7 - 4.0 K/uL Final   Monocytes Relative 07/11/2019 7  % Final   Monocytes Absolute 07/11/2019 0.0* 0.1 - 1.0 K/uL Final   Eosinophils Relative  07/11/2019 0  % Final   Eosinophils Absolute 07/11/2019 0.0  0.0 - 0.5 K/uL Final   Basophils Relative 07/11/2019 0  % Final   Basophils Absolute 07/11/2019 0.0  0.0 - 0.1 K/uL Final   Immature Granulocytes 07/11/2019 0  % Final   Abs Immature Granulocytes 07/11/2019 0.00  0.00 - 0.07 K/uL Final   Performed at [facility], [place] [place], [place] [address]    (this displays the last labs from the last 3 days)  No results found for: TOTALPROTELP, ALBUMINELP, A1GS, A2GS, BETS, BETA2SER, GAMS, MSPIKE, SPEI (this displays SPEP labs)  No results found for: KPAFRELGTCHN, LAMBDASER, KAPLAMBRATIO (kappa/lambda light chains)  Lab Results  Component Value Date   HGBA 97.4 12/16/2016   (Hemoglobinopathy evaluation)   No results found for: LDH  No results found for: IRON, TIBC, IRONPCTSAT (Iron and TIBC)  No results found for: FERRITIN  Urinalysis    Component Value Date/Time   COLORURINE YELLOW 06/26/2019 1300   APPEARANCEUR HAZY (A) 06/26/2019 1300   LABSPEC 1.019 06/26/2019 1300   PHURINE 6.0 06/26/2019 1300   GLUCOSEU NEGATIVE 06/26/2019 1300   HGBUR NEGATIVE 06/26/2019 1300   BILIRUBINUR NEGATIVE 06/26/2019 1300   KETONESUR NEGATIVE 06/26/2019 1300    PROTEINUR NEGATIVE 06/26/2019 1300   UROBILINOGEN 4.0 (H) 01/13/2018 1417   NITRITE NEGATIVE 06/26/2019 1300   LEUKOCYTESUR LARGE (A) 06/26/2019 1300     STUDIES:  No results found.   ELIGIBLE FOR AVAILABLE RESEARCH PROTOCOL: no   ASSESSMENT: 33 y.o. [place] woman status post right breast biopsy x2 and right axillary lymph node biopsy 04/19/2019 for a clinical T4 N2 MX, stage IIIC invasive ductal carcinoma, grade 3, triple negative, with an MIB-1 of 90%  (a) chest CT scan 04/11/2019 shows multiple subcentimeter lung nodules  (b) PET scan denied by insurance, CT A/P and bone scan due 05/09/2019  (c) baseline breast MRI 04/30/2019 shows involvement of all 4 quadrants in the right breast as well as right axillary subpectoral and internal mammary lymphadenopathy.  Left breast was unremarkable  (1) neoadjuvant chemotherapy consisting of  (a) pembrolizumab started 05/03/2019, held after first dose because of elevated liver function tests  (b) paclitaxel weekly x12 with carboplatin every 21 days x 4, started 05/08/2019   (i) tolerated carboplatin AUC 5-day 1 poorly, switched to AUC 2 on day 1 day 8   (ii) cycle 2 delayed 1 week because of elevated liver function tests   (iii) carboplatin and paclitaxel discontinued after 5 doses because of neuropathy, with evidence of response  (c) cyclophosphamide and doxorubicin in dose dense fashion x4 starting 07/03/2019   (2) definitive surgery pending  (3) adjuvant radiation to follow  (4) genetics testing  (a) genetic testing 05/14/2019 through the Common Hereditary Gene Panel offered by [facility] found no deleterious mutations in APC, ATM, AXIN2, BARD1, BMPR1A, BRCA1, BRCA2, BRIP1, CDH1, CDK4, CDKN2A (p14ARF), CDKN2A (p16INK4a), CHEK2, CTNNA1, DICER1, EPCAM (Deletion/duplication testing only), GREM1 (promoter region deletion/duplication testing only), KIT, MEN1, MLH1, MSH2, MSH3, MSH6, MUTYH, NBN, NF1, NHTL1, PALB2, PDGFRA, PMS2, POLD1, POLE, PTEN,  RAD50, RAD51C, RAD51D, RNF43, SDHB, SDHC, SDHD, SMAD4, SMARCA4. STK11, TP53, TSC1, TSC2, and VHL.  The following genes were evaluated for sequence changes only: SDHA and HOXB13 c.251G>A variant only  (5) goserelin started 05/08/2019   PLAN:  Terri Estes did not tolerate her first cycle of Doxorubicin and Cyclophosphamide as well as I had hoped.  She has had several issues today and I will list these one  by one.    1. Breast cancer: didn't examine breasts today, due to her feeling so poorly.  She will continue on treatment and undergo breast ultrasound on 07/18/2019.  I explained that we will work to fix these issues she has had below so that she can continue with treatments and not feel so badly, as our goal of therapy is cure.    2. Thrush:  This is causing sore throat and difficulty swallowing.  Will give IV Fluconazole today and hopefully this will help her ability to take her oral medications.  3.  Dehydration: IV fluids daily x 3 days.    4. Neutropenia: Secondary to chemotherapy.  She was recommended to start cipro BID and practice neutropenic precautions.  5. Nausea/vomiting: She has been unable to swallow oral anti emetics.  Will give her IV Zofran today.    6. Tachycardia:  Likely related to dehydration.  Will recheck heart rate after she receives one liter of fluids and will get EKG if not improved.  I will see Terri Estes back tomorrow to make sure that she is starting to improve.  She knows to call for any other issues that may develop before the next visit.  A total of (50) minutes of face-to-face time was spent with this patient with greater than 50% of that time in counseling and care-coordination.  Wilber Bihari, NP 07/11/19 11:25 AM Medical Oncology and Hematology [facility] [address] [place], [place] [address] Tel. [phone]    Fax. [phone]

## 2019-07-11 NOTE — Progress Notes (Deleted)
[place]  [phone] [phone]    ID: [name] DOB: 17-Nov-1986  MR#: [id number]  WGY#:[id number]  Patient Care Team: Patient, No Pcp Per as PCP - General (General Practice) Alphonsa Overall, MD as Consulting Physician (General Surgery) Magrinat, [name], MD as Consulting Physician (Oncology) Kyung Rudd, MD as Consulting Physician (Radiation Oncology) Rockwell Germany, RN as Oncology Nurse Navigator Mauro Kaufmann, RN as Oncology Nurse Navigator Donnamae Jude, MD as Consulting Physician (Obstetrics and Gynecology) Pavelock, Ralene Bathe, MD as Consulting Physician (Internal Medicine) OTHER MD: Joylene Igo for [facility] is [phone]   CHIEF COMPLAINT: Triple negative breast cancer  CURRENT TREATMENT: Neoadjuvant chemotherapy[/immunotherapy]/goserelin   INTERVAL HISTORY: [name] returns today for follow-up and treatment of her estrogen negative breast cancer.  She continues on neoadjuvant chemotherapy with Doxorubicin and Cyclophosphamide that started last week.  We discontinued her Paclitaxel and Carboplatin last week due to peripheral neuropathy and she only received 5 cycles of this.  Today is cycle 1 day 8 of Doxorubicin/Cylcophosphamide with Onpro support.    She is also receiving goserelin every 28 days, with her most recent dose 07/03/2019.  She is due for another dose today.   REVIEW OF SYSTEMS: [name].    HISTORY OF CURRENT ILLNESS: From the original intake note:  [name] presented with swelling along the left side of the face with neck pain. She then underwent a neck CT on 04/11/2019 showing: Enlarged left level 2 lymph nodes with associated inflammatory change including stranding of the adjacent fat and thickening of the platysma. While malignancy is not excluded, this is most concerning for infection. No focal abscess is present. Hypoattenuation within 1 of the left level 2 lymph nodes likely represents  central necrosis or suppuration of the node. No primary malignancy or other abscess. Left upper lobe pulmonary nodules may be inflammatory. The largest measures 0.7 x 0.7 x 0.6 cm. CT of the chest with contrast may be useful for further evaluation.  She also underwent a chest CT on the same day showing: Complex right breast mass measuring 5.6 x 4.5 x 4.7 cm consistent with a primary breast carcinoma. Malignant right axillary adenopathy measuring up to 3.1 cm. Multiple bilateral pulmonary nodules are concerning for metastatic disease. Given the right breast mass, the left cervical lymph nodes are more likely malignant.  She then underwent bilateral diagnostic mammography with tomography and right breast ultrasonography at [facility] on 04/19/2019 showing: Breast Density Category C; findings which are highly suspicious for multicentric inflammatory right breast cancer with right axillary nodal metastatic disease; no mammographic evidence of malignancy involving the left breast.  Accordingly on 04/19/2019 she proceeded to biopsy of the right breast area in question. The pathology from this procedure showed ([id number]): invasive ductal carcinoma, grade III, upper inner quadrant, 12:30 o'clock, 5.0 cm from the nipple. Prognostic indicators significant for: estrogen receptor, 0% negative and progesterone receptor, 0% negative. Proliferation marker Ki67 at 90%. HER2 negative (0+).  Additional biopsies of the right breast and right axilla were performed on the same day. The pathology from this procedure showed ([id number]): 2. Breast, right, needle core biopsy, satellite mass UOQ, 10 o'clock, 8cmfn  - Invasive ductal carcinoma, grade III 3. Lymph node, needle/core biopsy, level 1 right axilla   - Invasive ductal carcinoma, grade III  The patient's subsequent history is as detailed below.   PAST MEDICAL HISTORY: Past Medical History:  Diagnosis Date   Anemia    after pregnancy   Anxiety  severe not taking meds   Asthma    inhaler  4x per day   Chronic pain    Family history of breast cancer    History of substance abuse ([place])    Pregnancy induced hypertension    takes medication now.   Psoriasis    Trichomonas infection    Vaginal Pap smear, abnormal      PAST SURGICAL HISTORY: Past Surgical History:  Procedure Laterality Date   CESAREAN SECTION  05/26/2008   CESAREAN SECTION N/A 07/10/2017   Procedure: CESAREAN SECTION;  Surgeon: Florian Buff, MD;  Location: [facility];  Service: Obstetrics;  Laterality: N/A;   CESAREAN SECTION N/A 09/23/2018   Procedure: CESAREAN SECTION;  Surgeon: Donnamae Jude, MD;  Location: [facility];  Service: Obstetrics;  Laterality: N/A;   COLPOSCOPY     PORTACATH PLACEMENT Left 04/30/2019   Procedure: INSERTION PORT-A-CATH WITH ULTRASOUND;  Surgeon: Alphonsa Overall, MD;  Location: [facility];  Service: General;  Laterality: Left;  Left Subclavian vein   WISDOM TOOTH EXTRACTION       FAMILY HISTORY: Family History  Problem Relation Age of Onset   Kidney disease Maternal Grandmother    Breast cancer Maternal Grandmother 28       metastatic to liver; d. 32   Kidney disease Maternal Grandfather    Kidney disease Paternal Grandmother    Diabetes Paternal Grandmother    Breast cancer Paternal Grandmother    Kidney disease Paternal Grandfather    Breast cancer Paternal Aunt    Cerebral palsy Child    Breast cancer Paternal Great-grandmother    Pancreatic cancer Other        PGMs brother    Throat cancer Paternal Uncle    Breast cancer Other        PGMs sister  (Updated 04/25/2019) Khianna's father is living at age 31. Patients' mother is also living at age 54. Marland Kitchen[place] notes, however, that she was raised by her grandmother.) The patient has 0 brothers and 5 sisters. Patient denies anyone in her family having ovarian, prostate, or pancreatic cancer. Her maternal grandmother was diagnosed with  breast cancer, unsure what age. On her father's side, she reports her grandmother had cysts in her breasts, her great-aunt might have had breast cancer, and her great-grandmother was diagnosed with breast cancer at an older age.   GYNECOLOGIC HISTORY:  No LMP recorded. (Menstrual status: Chemotherapy).  She had an emergent C-section in 09/23/2018 for abruptio placenta at 30 weeks pregnancy, also has a history of eclampsia Menarche:    years old Age at first live birth: 33 years old [place] P: 3 LMP: 04/18/2019 Contraceptive: none HRT: none  Hysterectomy?: no BSO?: no   SOCIAL HISTORY: (Current as of 04/25/2019) [name] is currently unemployed. She previously worked at [employer]. She is currently engaged. Fiance [name] runs a Midwife and works other odd jobs. Daughter Demetrius Charity, age 23, was abused by her father at 70 months and has cerebral palsy. Son [name], age 61, has severe asthma and is developmentally disabled. Daughter Yetta Glassman was born on 09/23/2018 prematurely and remains on oxygen at home.    ADVANCED DIRECTIVES: not in place.    HEALTH MAINTENANCE: Social History   Tobacco Use   Smoking status: Former Smoker    Packs/day: 0.50    Years: 5.00    Pack years: 2.50    Types: Cigarettes    Quit date: 04/10/2019    Years since quitting: 0.2   Smokeless  tobacco: Never Used   Tobacco comment: trying to quit  Substance Use Topics   Alcohol use: No   Drug use: Yes    Frequency: 7.0 times per week    Types: Marijuana    Comment: subutex    Colonoscopy: never done  PAP: 05/10/2018  Bone density: never done Mammography: first performed following abnormal CT  No Known Allergies  Current Outpatient Medications  Medication Sig Dispense Refill   acyclovir ointment (ZOVIRAX) 5 % Apply 1 application topically 2 (two) times daily as needed. Mix 1 part acyclovir cream with 1 part over the counter hydrocortisone 1% cream and apply to affected area two times a day  as needed 5 g 0   albuterol (PROVENTIL HFA;VENTOLIN HFA) 108 (90 Base) MCG/ACT inhaler Inhale 2 puffs into the lungs 4 (four) times daily. 1 Inhaler 2   buprenorphine (SUBUTEX) 8 MG SUBL SL tablet Place 8 mg under the tongue 2 (two) times daily.      cholestyramine (QUESTRAN) 4 g packet Take 1 packet (4 g total) by mouth 3 (three) times daily with meals. 60 each 12   cyclobenzaprine (FLEXERIL) 10 MG tablet Take 1 tablet (10 mg total) by mouth 3 (three) times daily as needed for muscle spasms. 90 tablet 2   dexamethasone (DECADRON) 4 MG tablet TAKE 2 TABLETS BY MOUTH DAILY ON THE DAY AFTER CHEMOTHERAPY, AND THEN TAKE 2 TABLETS TWICE DAILY FOR 2 DAYS AFTERWARDS. TAKE WITH FOOD 30 tablet 1   enalapril (VASOTEC) 10 MG tablet TAKE 1 TABLET BY MOUTH DAILY 90 tablet 0   gabapentin (NEURONTIN) 300 MG capsule Take 1 capsule (300 mg total) by mouth 3 (three) times daily. 90 capsule 0   nitrofurantoin, macrocrystal-monohydrate, (MACROBID) 100 MG capsule Take 1 capsule (100 mg total) by mouth 2 (two) times daily. (Patient not taking: Reported on 07/03/2019) 14 capsule 0   omeprazole (PRILOSEC) 40 MG capsule Take 1 capsule (40 mg total) by mouth daily. 30 capsule 5   ondansetron (ZOFRAN) 8 MG tablet TAKE 1 TABLET(8 MG) BY MOUTH EVERY 8 HOURS AS NEEDED FOR NAUSEA OR VOMITING 20 tablet 0   PARoxetine (PAXIL) 10 MG tablet Take 1 tablet (10 mg total) by mouth daily. (Patient taking differently: Take 10 mg by mouth daily. Patient is taking 2 tablets daily per MD) 30 tablet 5   Prenatal MV-Min-FA-Omega-3 (PRENATAL GUMMIES/DHA & FA) 0.4-32.5 MG CHEW Chew 2 each by mouth daily.     prochlorperazine (COMPAZINE) 10 MG tablet TAKE 1 TABLET(10 MG) BY MOUTH EVERY 6 HOURS AS NEEDED FOR NAUSEA OR VOMITING 30 tablet 1   valACYclovir (VALTREX) 1000 MG tablet Take 1 tablet (1,000 mg total) by mouth 2 (two) times daily. 60 tablet 0   No current facility-administered medications for this visit.    Facility-Administered  Medications Ordered in Other Visits  Medication Dose Route Frequency Provider Last Rate Last Dose   0.9 % NaCl with KCl 20 mEq/ L  infusion   Intravenous Once Magrinat, [name], MD       sodium chloride flush (NS) 0.9 % injection 10 mL  10 mL Intracatheter PRN Magrinat, [name], MD         OBJECTIVE:   There were no vitals filed for this visit.   There is no height or weight on file to calculate BMI.   Wt Readings from Last 3 Encounters:  07/03/19 206 lb 8 oz (93.7 kg)  06/26/19 208 lb 9.6 oz (94.6 kg)  06/19/19 198 lb 11.2  oz (90.1 kg)  ECOG FS:1 - Symptomatic but completely ambulatory GENERAL: Patient is a well appearing female in no acute distress HEENT:  Sclerae anicteric.  Oropharynx clear and moist. No ulcerations or evidence of oropharyngeal candidiasis. Neck is supple.  NODES:  No cervical, supraclavicular, or axillary lymphadenopathy palpated.  BREAST EXAM:  Rash remains on breast, dry patches noted.  Breast appears improved. LUNGS:  Clear to auscultation bilaterally.  No wheezes or rhonchi. HEART:  Regular rate and rhythm. No murmur appreciated. ABDOMEN:  Soft, nontender.  Positive, normoactive bowel sounds. No organomegaly palpated. MSK:  No focal spinal tenderness to palpation. Full range of motion bilaterally in the upper extremities. EXTREMITIES:  No peripheral edema.   SKIN:  Clear with no obvious rashes or skin changes. No nail dyscrasia. NEURO:  Nonfocal. Well oriented.  Appropriate affect. GYN: Patient with very small appears to be healing erythematous lesion at 9 o'clock on urethral opening.  Viral culture taken.  No other lesions, no vaginal discharge noted.      LAB RESULTS:  CMP     Component Value Date/Time   NA 139 07/03/2019 0919   NA 137 05/10/2018 1535   K 4.3 07/03/2019 0919   CL 102 07/03/2019 0919   CO2 29 07/03/2019 0919   GLUCOSE 83 07/03/2019 0919   BUN 14 07/03/2019 0919   BUN 7 05/10/2018 1535   CREATININE 0.81 07/03/2019 0919    CALCIUM 8.7 (L) 07/03/2019 0919   PROT 6.2 (L) 07/03/2019 0919   PROT 7.2 05/10/2018 1535   ALBUMIN 3.4 (L) 07/03/2019 0919   ALBUMIN 4.2 05/10/2018 1535   AST 54 (H) 07/03/2019 0919   ALT 102 (H) 07/03/2019 0919   ALKPHOS 68 07/03/2019 0919   BILITOT <0.2 (L) 07/03/2019 0919   GFRNONAA >60 07/03/2019 0919   GFRAA >60 07/03/2019 0919    No results found for: TOTALPROTELP, ALBUMINELP, A1GS, A2GS, BETS, BETA2SER, GAMS, MSPIKE, SPEI  No results found for: KPAFRELGTCHN, LAMBDASER, KAPLAMBRATIO  Lab Results  Component Value Date   WBC 5.8 07/03/2019   NEUTROABS 2.3 07/03/2019   HGB 9.4 (L) 07/03/2019   HCT 29.6 (L) 07/03/2019   MCV 98.3 07/03/2019   PLT 192 07/03/2019    _0 @  No results found for: LABCA2  No components found for: CEYEMV361  No results for input(s): INR in the last 168 hours.  No results found for: LABCA2  No results found for: QAE497  No results found for: NPY051  No results found for: TMY111  No results found for: CA2729  No components found for: HGQUANT  No results found for: CEA1 / No results found for: CEA1   No results found for: AFPTUMOR  No results found for: CHROMOGRNA  No results found for: PSA1  No visits with results within 3 Day(s) from this visit.  Latest known visit with results is:  Appointment on 07/03/2019  Component Date Value Ref Range Status   WBC 07/03/2019 5.8  4.0 - 10.5 K/uL Final   RBC 07/03/2019 3.01* 3.87 - 5.11 MIL/uL Final   Hemoglobin 07/03/2019 9.4* 12.0 - 15.0 g/dL Final   HCT 07/03/2019 29.6* 36.0 - 46.0 % Final   MCV 07/03/2019 98.3  80.0 - 100.0 fL Final   MCH 07/03/2019 31.2  26.0 - 34.0 pg Final   MCHC 07/03/2019 31.8  30.0 - 36.0 g/dL Final   RDW 07/03/2019 18.1* 11.5 - 15.5 % Final   Platelets 07/03/2019 192  150 - 400 K/uL Final   nRBC 07/03/2019  0.7* 0.0 - 0.2 % Final   Neutrophils Relative % 07/03/2019 40  % Final   Neutro Abs 07/03/2019 2.3  1.7 - 7.7 K/uL Final    Lymphocytes Relative 07/03/2019 56  % Final   Lymphs Abs 07/03/2019 3.2  0.7 - 4.0 K/uL Final   Monocytes Relative 07/03/2019 3  % Final   Monocytes Absolute 07/03/2019 0.2  0.1 - 1.0 K/uL Final   Eosinophils Relative 07/03/2019 0  % Final   Eosinophils Absolute 07/03/2019 0.0  0.0 - 0.5 K/uL Final   Basophils Relative 07/03/2019 0  % Final   Basophils Absolute 07/03/2019 0.0  0.0 - 0.1 K/uL Final   Immature Granulocytes 07/03/2019 1  % Final   Abs Immature Granulocytes 07/03/2019 0.05  0.00 - 0.07 K/uL Final   Polychromasia 07/03/2019 PRESENT   Final   Ovalocytes 07/03/2019 PRESENT   Final   Performed at [facility], [place] [address], [place], [place] [address]   Sodium 07/03/2019 139  135 - 145 mmol/L Final   Potassium 07/03/2019 4.3  3.5 - 5.1 mmol/L Final   Chloride 07/03/2019 102  98 - 111 mmol/L Final   CO2 07/03/2019 29  22 - 32 mmol/L Final   Glucose, Bld 07/03/2019 83  70 - 99 mg/dL Final   BUN 07/03/2019 14  6 - 20 mg/dL Final   Creatinine 07/03/2019 0.81  0.44 - 1.00 mg/dL Final   Calcium 07/03/2019 8.7* 8.9 - 10.3 mg/dL Final   Total Protein 07/03/2019 6.2* 6.5 - 8.1 g/dL Final   Albumin 07/03/2019 3.4* 3.5 - 5.0 g/dL Final   AST 07/03/2019 54* 15 - 41 U/L Final   ALT 07/03/2019 102* 0 - 44 U/L Final   Alkaline Phosphatase 07/03/2019 68  38 - 126 U/L Final   Total Bilirubin 07/03/2019 <0.2* 0.3 - 1.2 mg/dL Final   GFR, Est Non Af Am 07/03/2019 >60  >60 mL/min Final   GFR, Est AFR Am 07/03/2019 >60  >60 mL/min Final   Anion gap 07/03/2019 8  5 - 15 Final   Performed at [facility], [address] [place], [place] [address]    (this displays the last labs from the last 3 days)  No results found for: TOTALPROTELP, ALBUMINELP, A1GS, A2GS, BETS, BETA2SER, GAMS, MSPIKE, SPEI (this displays SPEP labs)  No results found for: KPAFRELGTCHN, LAMBDASER, KAPLAMBRATIO (kappa/lambda light chains)  Lab  Results  Component Value Date   HGBA 97.4 12/16/2016   (Hemoglobinopathy evaluation)   No results found for: LDH  No results found for: IRON, TIBC, IRONPCTSAT (Iron and TIBC)  No results found for: FERRITIN  Urinalysis    Component Value Date/Time   COLORURINE YELLOW 06/26/2019 1300   APPEARANCEUR HAZY (A) 06/26/2019 1300   LABSPEC 1.019 06/26/2019 1300   PHURINE 6.0 06/26/2019 1300   GLUCOSEU NEGATIVE 06/26/2019 1300   HGBUR NEGATIVE 06/26/2019 1300   BILIRUBINUR NEGATIVE 06/26/2019 1300   KETONESUR NEGATIVE 06/26/2019 1300   PROTEINUR NEGATIVE 06/26/2019 1300   UROBILINOGEN 4.0 (H) 01/13/2018 1417   NITRITE NEGATIVE 06/26/2019 1300   LEUKOCYTESUR LARGE (A) 06/26/2019 1300     STUDIES:  No results found.   ELIGIBLE FOR AVAILABLE RESEARCH PROTOCOL: no   ASSESSMENT: 33 y.o. [place] woman status post right breast biopsy x2 and right axillary lymph node biopsy 04/19/2019 for a clinical T4 N2 MX, stage IIIC invasive ductal carcinoma, grade 3, triple negative, with an MIB-1 of 90%  (a) chest CT scan 04/11/2019  shows multiple subcentimeter lung nodules  (b) PET scan denied by insurance, CT A/P and bone scan due 05/09/2019  (c) baseline breast MRI 04/30/2019 shows involvement of all 4 quadrants in the right breast as well as right axillary subpectoral and internal mammary lymphadenopathy.  Left breast was unremarkable  (1) neoadjuvant chemotherapy consisting of  (a) pembrolizumab started 05/03/2019, held after first dose because of elevated liver function tests  (b) paclitaxel weekly x12 with carboplatin every 21 days x 4, started 05/08/2019   (i) tolerated carboplatin AUC 5-day 1 poorly, switched to AUC 2 on day 1 day 8   (ii) cycle 2 delayed 1 week because of elevated liver function tests   (iii) carboplatin and paclitaxel discontinued after 5 doses because of neuropathy, with evidence of response  (c) cyclophosphamide and doxorubicin in dose dense fashion x4 starting  07/03/2019   (2) definitive surgery pending  (3) adjuvant radiation to follow  (4) genetics testing  (a) genetic testing 05/14/2019 through the Common Hereditary Gene Panel offered by [facility] found no deleterious mutations in APC, ATM, AXIN2, BARD1, BMPR1A, BRCA1, BRCA2, BRIP1, CDH1, CDK4, CDKN2A (p14ARF), CDKN2A (p16INK4a), CHEK2, CTNNA1, DICER1, EPCAM (Deletion/duplication testing only), GREM1 (promoter region deletion/duplication testing only), KIT, MEN1, MLH1, MSH2, MSH3, MSH6, MUTYH, NBN, NF1, NHTL1, PALB2, PDGFRA, PMS2, POLD1, POLE, PTEN, RAD50, RAD51C, RAD51D, RNF43, SDHB, SDHC, SDHD, SMAD4, SMARCA4. STK11, TP53, TSC1, TSC2, and VHL.  The following genes were evaluated for sequence changes only: SDHA and HOXB13 c.251G>A variant only  (5) goserelin started 05/08/2019   PLAN:  [name] is doing well.    We will see her with every treatment at this point.  She returns next week.  She knows to call for any other issues that may develop before the next visit.  A total of (30) minutes of face-to-face time was spent with this patient with greater than 50% of that time in counseling and care-coordination.    Wilber Bihari, NP 07/11/19 8:22 AM Medical Oncology and Hematology [facility] [address] [place], [place] [address] Tel. [phone]    Fax. [phone]

## 2019-07-11 NOTE — Telephone Encounter (Signed)
Received panic WBC from lab/Marie of 0.3.  Reported to [facility] LPN.  Pt is seeing Mendel Ryder NP now.

## 2019-07-11 NOTE — Patient Instructions (Signed)

## 2019-07-12 ENCOUNTER — Encounter: Payer: Self-pay | Admitting: Adult Health

## 2019-07-12 ENCOUNTER — Other Ambulatory Visit: Payer: Self-pay

## 2019-07-12 ENCOUNTER — Inpatient Hospital Stay (HOSPITAL_BASED_OUTPATIENT_CLINIC_OR_DEPARTMENT_OTHER): Payer: Medicaid Other | Admitting: Adult Health

## 2019-07-12 ENCOUNTER — Inpatient Hospital Stay: Payer: Medicaid Other

## 2019-07-12 VITALS — BP 122/76 | HR 145 | Temp 98.2°F | Resp 20

## 2019-07-12 DIAGNOSIS — R112 Nausea with vomiting, unspecified: Secondary | ICD-10-CM

## 2019-07-12 DIAGNOSIS — Z5111 Encounter for antineoplastic chemotherapy: Secondary | ICD-10-CM | POA: Diagnosis not present

## 2019-07-12 DIAGNOSIS — C50811 Malignant neoplasm of overlapping sites of right female breast: Secondary | ICD-10-CM | POA: Diagnosis not present

## 2019-07-12 DIAGNOSIS — R300 Dysuria: Secondary | ICD-10-CM

## 2019-07-12 DIAGNOSIS — Z171 Estrogen receptor negative status [ER-]: Secondary | ICD-10-CM

## 2019-07-12 DIAGNOSIS — B3781 Candidal esophagitis: Secondary | ICD-10-CM

## 2019-07-12 DIAGNOSIS — E860 Dehydration: Secondary | ICD-10-CM

## 2019-07-12 DIAGNOSIS — G620 Drug-induced polyneuropathy: Secondary | ICD-10-CM

## 2019-07-12 DIAGNOSIS — Z803 Family history of malignant neoplasm of breast: Secondary | ICD-10-CM

## 2019-07-12 DIAGNOSIS — R000 Tachycardia, unspecified: Secondary | ICD-10-CM

## 2019-07-12 DIAGNOSIS — R5383 Other fatigue: Secondary | ICD-10-CM

## 2019-07-12 DIAGNOSIS — J45909 Unspecified asthma, uncomplicated: Secondary | ICD-10-CM

## 2019-07-12 DIAGNOSIS — C773 Secondary and unspecified malignant neoplasm of axilla and upper limb lymph nodes: Secondary | ICD-10-CM | POA: Diagnosis not present

## 2019-07-12 DIAGNOSIS — R200 Anesthesia of skin: Secondary | ICD-10-CM

## 2019-07-12 DIAGNOSIS — B370 Candidal stomatitis: Secondary | ICD-10-CM

## 2019-07-12 DIAGNOSIS — D701 Agranulocytosis secondary to cancer chemotherapy: Secondary | ICD-10-CM | POA: Diagnosis not present

## 2019-07-12 DIAGNOSIS — G8929 Other chronic pain: Secondary | ICD-10-CM

## 2019-07-12 DIAGNOSIS — Z87891 Personal history of nicotine dependence: Secondary | ICD-10-CM

## 2019-07-12 DIAGNOSIS — Z79899 Other long term (current) drug therapy: Secondary | ICD-10-CM

## 2019-07-12 MED ORDER — SODIUM CHLORIDE 0.9 % IV SOLN
INTRAVENOUS | Status: DC
  Administered 2019-07-12: 09:00:00 via INTRAVENOUS
  Filled 2019-07-12 (×2): qty 250

## 2019-07-12 MED ORDER — ONDANSETRON HCL 4 MG/2ML IJ SOLN
8.0000 mg | Freq: Once | INTRAMUSCULAR | Status: AC
  Administered 2019-07-12: 8 mg via INTRAVENOUS

## 2019-07-12 MED ORDER — HEPARIN SOD (PORK) LOCK FLUSH 100 UNIT/ML IV SOLN
500.0000 [IU] | Freq: Once | INTRAVENOUS | Status: AC | PRN
  Administered 2019-07-12: 500 [IU]
  Filled 2019-07-12: qty 5

## 2019-07-12 MED ORDER — ALTEPLASE 2 MG IJ SOLR
2.0000 mg | Freq: Once | INTRAMUSCULAR | Status: DC | PRN
  Filled 2019-07-12: qty 2

## 2019-07-12 MED ORDER — ONDANSETRON HCL 4 MG/2ML IJ SOLN
INTRAMUSCULAR | Status: AC
  Filled 2019-07-12: qty 4

## 2019-07-12 MED ORDER — SODIUM CHLORIDE 0.9% FLUSH
10.0000 mL | INTRAVENOUS | Status: DC | PRN
  Administered 2019-07-12: 11:00:00 10 mL
  Filled 2019-07-12: qty 10

## 2019-07-12 NOTE — Patient Instructions (Signed)
[facility] Discharge Instructions for Patients    To help prevent nausea and vomiting after your treatment, we encourage you to take your nausea medication as directed  If you develop nausea and vomiting that is not controlled by your nausea medication, call the clinic.   BELOW ARE SYMPTOMS THAT SHOULD BE REPORTED IMMEDIATELY:  *FEVER GREATER THAN 100.5 F  *CHILLS WITH OR WITHOUT FEVER  NAUSEA AND VOMITING THAT IS NOT CONTROLLED WITH YOUR NAUSEA MEDICATION  *UNUSUAL SHORTNESS OF BREATH  *UNUSUAL BRUISING OR BLEEDING  TENDERNESS IN MOUTH AND THROAT WITH OR WITHOUT PRESENCE OF ULCERS  *URINARY PROBLEMS  *BOWEL PROBLEMS  UNUSUAL RASH Items with * indicate a potential emergency and should be followed up as soon as possible.  Feel free to call the clinic should you have any questions or concerns. The clinic phone number is [phone].  Please show the [facility] at check-in to the Emergency Department and triage nurse.    Neutropenia (Low white count) Neutropenia is a condition that occurs when you have a lower-than-normal level of a type of white blood cell (neutrophil) in your body. Neutrophils are made in the spongy center of large bones (bone marrow), and they fight infections. Neutrophils are your body's main defense against bacterial and fungal infections. The fewer neutrophils you have and the longer your body remains without them, the greater your risk of getting a severe infection. What are the causes? This condition can occur if your body uses up or destroys neutrophils faster than your bone marrow can make them. Neutropenia may be caused by:  A bacterial or fungal infection.  Allergic disorders.  Reactions to some medicines.  An autoimmune disease.  An enlarged spleen. This condition can also occur if your bone marrow does not produce enough neutrophils. This problem may be caused by:  Cancer.  Cancer treatments, such as radiation or  chemotherapy.  Viral infections.  Medicines, such as phenytoin.  Vitamin B12 deficiency.  Diseases of the bone marrow.  Environmental toxins, such as insecticides. What are the signs or symptoms? This condition does not usually cause symptoms. If symptoms are present, they are usually caused by an underlying infection. Symptoms of an infection may include:  Fever.  Chills.  Swollen glands.  Oral or anal ulcers.  Cough and shortness of breath.  Rash.  Skin infection.  Fatigue. How is this diagnosed? Your health care provider may suspect neutropenia if you have:  A condition that may cause neutropenia.  Symptoms during or after treatment for cancer.  Symptoms of infection, especially fever.  Frequent and unusual infections. This condition is diagnosed based on your medical history and a physical exam. Tests will also be done, such as:  A complete blood count (CBC).  A procedure to collect a sample of bone marrow for examination (bone marrow biopsy).  A chest X-ray.  A urine culture.  A blood culture. How is this treated? Treatment depends on the underlying cause and severity of your condition. Mild neutropenia may not require treatment. Treatment may include medicines, such as:  Antibiotic medicine given through an IV.  Antiviral medicines.  Antifungal medicines.  A medicine to increase neutrophil production (colony-stimulating factor). You may get this drug through an IV or by injection.  Steroids given through an IV. If an underlying condition is causing neutropenia, you may need treatment for that condition. If medicines or cancer treatments are causing neutropenia, your health care provider may have you stop the medicines or treatment.  Follow these instructions at home: Medicines   Take over-the-counter and prescription medicines only as told by your health care provider.  Get a seasonal flu shot (influenza vaccine).  Avoid people who received  a vaccine in the past 30 days if that vaccine contained a live version of the germ (live vaccine). You should not get a live vaccine. Common live vaccines are polio, MMR, chicken pox, and shingles vaccines. Eating and drinking  Do not share food utensils.  Do not eat unpasteurized foods.  Do not eat raw or undercooked meat, eggs, or seafood.  Do not eat unwashed, raw fruits or vegetables. Lifestyle  Avoid exposure to groups of people or children.  Avoid being around people who are sick.  Avoid being around dirt or dust, such as in construction areas or gardens.  Do not provide direct care for pets. Avoid animal droppings. Do not clean litter boxes and bird cages.  Do not have sex unless your health care provider has approved. Hygiene   Bathe daily.  Clean the area between the genitals and the anus (perineal area) after you urinate or have a bowel movement. If you are female, wipe from front to back.  Brush your teeth with a soft toothbrush before and after meals.  Do not use a regular razor. Use an electric razor to remove hair.  Wash your hands often. Make sure others who come in contact with you also wash their hands. If soap and water are not available, use hand sanitizer. General instructions  Follow any precautions as told by your health care provider to reduce your risk for injury or infection.  Take actions to avoid cuts and burns. For example: ? Be cautious when you use knives. Always cut away from yourself. ? Keep knives in protective sheaths or guards when not in use. ? Use oven mitts when you cook with a hot stove, oven, or grill. ? Stand a safe distance away from open fires.  Do not use tampons, enemas, or rectal suppositories unless your health care provider has approved.  Keep all follow-up visits as told by your health care provider. This is important. Contact a health care provider if:  You have: ? A sore throat. ? A warm, red, or tender area on your  skin. ? A cough. ? Frequent or painful urination. ? Vaginal discharge or itching.  You develop: ? Sores in your mouth or anus. ? Swollen lymph nodes. ? Red streaks on the skin. ? A rash. Get help right away if:  You have: ? A fever. ? Chills, or you start to shake.  You feel: ? Nauseous, or you vomit. ? Very fatigued. ? Short of breath. Summary  Neutropenia is a condition that occurs when you have a lower-than-normal level of a type of white blood cell (neutrophil) in your body.  This condition can occur if your body uses up or destroys neutrophils faster than your bone marrow can make them.  Treatment depends on the underlying cause and severity of your condition. Mild neutropenia may not require treatment.  Follow any precautions as told by your health care provider to reduce your risk for injury or infection. This information is not intended to replace advice given to you by your health care provider. Make sure you discuss any questions you have with your health care provider. Document Released: 06/04/2002 Document Revised: 09/28/2018 Document Reviewed: 09/28/2018 [employer] Patient Education  2020 [employer].

## 2019-07-12 NOTE — Progress Notes (Signed)
[facility]  [phone] [phone]    ID: Terri Estes DOB: 06/29/1986  MR#: [id number]  CSN#:[id number]  Patient Care Team: Patient, No Pcp Per as PCP - General (General Practice) Newman, David, MD as Consulting Physician (General Surgery) Magrinat, Gustav C, MD as Consulting Physician (Oncology) Moody, John, MD as Consulting Physician (Radiation Oncology) Martini, Keisha N, RN as Oncology Nurse Navigator Stuart, Dawn C, RN as Oncology Nurse Navigator Pratt, Tanya S, MD as Consulting Physician (Obstetrics and Gynecology) Pavelock, Richard M, MD as Consulting Physician (Internal Medicine) OTHER MD: FAX for [facility] is [phone]   CHIEF COMPLAINT: Triple negative breast cancer  CURRENT TREATMENT: Neoadjuvant chemotherapy[/immunotherapy]/goserelin   INTERVAL HISTORY: Terri Estes returns today for follow-up and treatment of her estrogen negative breast cancer and is undergoing neoadjuvant chemotherapy.  I saw her yesterday, on cycle 1 day 8 of her doxorubicin and cyclophosphamide.  She was having difficulty with several things, and needed IV fluids daily for a few days along with treatment of her oral mucositis.  I am checking in with her today in infusion, to see if she is starting to feel better.  REVIEW OF SYSTEMS: Shalon is doing moderately well.  She remains fatigued.  She still has some achiness over her body.  She is able to take her pills now, and did so this morning, particularly the diflucan and cipro.  Teka denies any other issues such as fever, chills, chest pain, palpitations, cough, shortness of breath, bowel/bladder issues.  She is requesting IV nausea medication today.  Otherwise a detailed ROS was non contributory.    HISTORY OF CURRENT ILLNESS: From the original intake note:  Terri Estes presented with swelling along the left side of the face with neck pain. She then underwent a neck CT on 04/11/2019  showing: Enlarged left level 2 lymph nodes with associated inflammatory change including stranding of the adjacent fat and thickening of the platysma. While malignancy is not excluded, this is most concerning for infection. No focal abscess is present. Hypoattenuation within 1 of the left level 2 lymph nodes likely represents central necrosis or suppuration of the node. No primary malignancy or other abscess. Left upper lobe pulmonary nodules may be inflammatory. The largest measures 0.7 x 0.7 x 0.6 cm. CT of the chest with contrast may be useful for further evaluation.  She also underwent a chest CT on the same day showing: Complex right breast mass measuring 5.6 x 4.5 x 4.7 cm consistent with a primary breast carcinoma. Malignant right axillary adenopathy measuring up to 3.1 cm. Multiple bilateral pulmonary nodules are concerning for metastatic disease. Given the right breast mass, the left cervical lymph nodes are more likely malignant.  She then underwent bilateral diagnostic mammography with tomography and right breast ultrasonography at [facility] on 04/19/2019 showing: Breast Density Category C; findings which are highly suspicious for multicentric inflammatory right breast cancer with right axillary nodal metastatic disease; no mammographic evidence of malignancy involving the left breast.  Accordingly on 04/19/2019 she proceeded to biopsy of the right breast area in question. The pathology from this procedure showed ([id number]): invasive ductal carcinoma, grade III, upper inner quadrant, 12:30 o'clock, 5.0 cm from the nipple. Prognostic indicators significant for: estrogen receptor, 0% negative and progesterone receptor, 0% negative. Proliferation marker Ki67 at 90%. HER2 negative (0+).  Additional biopsies of the right breast and right axilla were performed on the same day. The pathology from this procedure showed ([id number]): 2. Breast, right, needle core biopsy,   satellite mass UOQ, 10  o'clock, 8cmfn  - Invasive ductal carcinoma, grade III 3. Lymph node, needle/core biopsy, level 1 right axilla   - Invasive ductal carcinoma, grade III  The patient's subsequent history is as detailed below.   PAST MEDICAL HISTORY: Past Medical History:  Diagnosis Date  . Anemia    after pregnancy  . Anxiety    severe not taking meds  . Asthma    inhaler  4x per day  . Chronic pain   . Family history of breast cancer   . History of substance abuse (HCC)   . Pregnancy induced hypertension    takes medication now.  . Psoriasis   . Trichomonas infection   . Vaginal Pap smear, abnormal      PAST SURGICAL HISTORY: Past Surgical History:  Procedure Laterality Date  . CESAREAN SECTION  05/26/2008  . CESAREAN SECTION N/A 07/10/2017   Procedure: CESAREAN SECTION;  Surgeon: Eure, Luther H, MD;  Location: [facility];  Service: Obstetrics;  Laterality: N/A;  . CESAREAN SECTION N/A 09/23/2018   Procedure: CESAREAN SECTION;  Surgeon: Pratt, Tanya S, MD;  Location: [facility];  Service: Obstetrics;  Laterality: N/A;  . COLPOSCOPY    . PORTACATH PLACEMENT Left 04/30/2019   Procedure: INSERTION PORT-A-CATH WITH ULTRASOUND;  Surgeon: Newman, David, MD;  Location: [facility];  Service: General;  Laterality: Left;  Left Subclavian vein  . WISDOM TOOTH EXTRACTION       FAMILY HISTORY: Family History  Problem Relation Age of Onset  . Kidney disease Maternal Grandmother   . Breast cancer Maternal Grandmother 28       metastatic to liver; d. 32  . Kidney disease Maternal Grandfather   . Kidney disease Paternal Grandmother   . Diabetes Paternal Grandmother   . Breast cancer Paternal Grandmother   . Kidney disease Paternal Grandfather   . Breast cancer Paternal Aunt   . Cerebral palsy Child   . Breast cancer Paternal Great-grandmother   . Pancreatic cancer Other        PGMs brother   . Throat cancer Paternal Uncle   . Breast cancer Other        PGMs sister  (Updated  04/25/2019) [name]'s father is living at age 48. Patients' mother is also living at age 49. (Keyle notes, however, that she was raised by her grandmother.) The patient has 0 brothers and 5 sisters. Patient denies anyone in her family having ovarian, prostate, or pancreatic cancer. Her maternal grandmother was diagnosed with breast cancer, unsure what age. On her father's side, she reports her grandmother had cysts in her breasts, her great-aunt might have had breast cancer, and her great-grandmother was diagnosed with breast cancer at an older age.   GYNECOLOGIC HISTORY:  No LMP recorded. (Menstrual status: Chemotherapy).  She had an emergent C-section in 09/23/2018 for abruptio placenta at 30 weeks pregnancy, also has a history of eclampsia Menarche:    years old Age at first live birth: 33 years old GX P: 3 LMP: 04/18/2019 Contraceptive: none HRT: none  Hysterectomy?: no BSO?: no   SOCIAL HISTORY: (Current as of 04/25/2019) Ashyia is currently unemployed. She previously worked at [employer]. She is currently engaged. Fiance [name] Bethea runs a shuttle service and works other odd jobs. Daughter [name], age 10, was abused by her father at 7 months and has cerebral palsy. Son Gregory, age 2, has severe asthma and is developmentally disabled. Daughter Zeanna was born on 09/23/2018 prematurely and remains on   oxygen at home.    ADVANCED DIRECTIVES: not in place.    HEALTH MAINTENANCE: Social History   Tobacco Use  . Smoking status: Former Smoker    Packs/day: 0.50    Years: 5.00    Pack years: 2.50    Types: Cigarettes    Quit date: 04/10/2019    Years since quitting: 0.2  . Smokeless tobacco: Never Used  . Tobacco comment: trying to quit  Substance Use Topics  . Alcohol use: No  . Drug use: Yes    Frequency: 7.0 times per week    Types: Marijuana    Comment: subutex    Colonoscopy: never done  PAP: 05/10/2018  Bone density: never done Mammography: first performed  following abnormal CT  No Known Allergies  Current Outpatient Medications  Medication Sig Dispense Refill  . acyclovir ointment (ZOVIRAX) 5 % Apply 1 application topically 2 (two) times daily as needed. Mix 1 part acyclovir cream with 1 part over the counter hydrocortisone 1% cream and apply to affected area two times a day as needed 5 g 0  . albuterol (PROVENTIL HFA;VENTOLIN HFA) 108 (90 Base) MCG/ACT inhaler Inhale 2 puffs into the lungs 4 (four) times daily. 1 Inhaler 2  . buprenorphine (SUBUTEX) 8 MG SUBL SL tablet Place 8 mg under the tongue 2 (two) times daily.     . cholestyramine (QUESTRAN) 4 g packet Take 1 packet (4 g total) by mouth 3 (three) times daily with meals. 60 each 12  . ciprofloxacin (CIPRO) 500 MG tablet Take 1 tablet (500 mg total) by mouth 2 (two) times daily. 14 tablet 0  . cyclobenzaprine (FLEXERIL) 10 MG tablet Take 1 tablet (10 mg total) by mouth 3 (three) times daily as needed for muscle spasms. 90 tablet 2  . dexamethasone (DECADRON) 4 MG tablet TAKE 2 TABLETS BY MOUTH DAILY ON THE DAY AFTER CHEMOTHERAPY, AND THEN TAKE 2 TABLETS TWICE DAILY FOR 2 DAYS AFTERWARDS. TAKE WITH FOOD 30 tablet 1  . enalapril (VASOTEC) 10 MG tablet TAKE 1 TABLET BY MOUTH DAILY 90 tablet 0  . fluconazole (DIFLUCAN) 200 MG tablet Take 1 tablet (200 mg total) by mouth daily. 30 tablet 0  . gabapentin (NEURONTIN) 300 MG capsule Take 1 capsule (300 mg total) by mouth 3 (three) times daily. 90 capsule 0  . omeprazole (PRILOSEC) 40 MG capsule Take 1 capsule (40 mg total) by mouth daily. 30 capsule 5  . ondansetron (ZOFRAN) 8 MG tablet TAKE 1 TABLET(8 MG) BY MOUTH EVERY 8 HOURS AS NEEDED FOR NAUSEA OR VOMITING 20 tablet 0  . PARoxetine (PAXIL) 10 MG tablet Take 1 tablet (10 mg total) by mouth daily. (Patient taking differently: Take 10 mg by mouth daily. Patient is taking 2 tablets daily per MD) 30 tablet 5  . Prenatal MV-Min-FA-Omega-3 (PRENATAL GUMMIES/DHA & FA) 0.4-32.5 MG CHEW Chew 2 each by  mouth daily.    . prochlorperazine (COMPAZINE) 10 MG tablet TAKE 1 TABLET(10 MG) BY MOUTH EVERY 6 HOURS AS NEEDED FOR NAUSEA OR VOMITING 30 tablet 1  . valACYclovir (VALTREX) 1000 MG tablet Take 1 tablet (1,000 mg total) by mouth 2 (two) times daily. 60 tablet 0   No current facility-administered medications for this visit.    Facility-Administered Medications Ordered in Other Visits  Medication Dose Route Frequency Provider Last Rate Last Dose  . 0.9 %  sodium chloride infusion   Intravenous Continuous Samreet Edenfield Cornetto, NP 750 mL/hr at 07/12/19 0852    . 0.9 % NaCl   with KCl 20 mEq/ L  infusion   Intravenous Once Magrinat, [name], MD      . alteplase (CATHFLO ACTIVASE) injection 2 mg  2 mg Intracatheter Once PRN Magrinat, [name], MD      . sodium chloride flush (NS) 0.9 % injection 10 mL  10 mL Intracatheter PRN Magrinat, [name], MD         OBJECTIVE:  See [facility] for vitals There were no vitals filed for this visit.   There is no height or weight on file to calculate BMI.   Wt Readings from Last 3 Encounters:  07/03/19 206 lb 8 oz (93.7 kg)  06/26/19 208 lb 9.6 oz (94.6 kg)  06/19/19 198 lb 11.2 oz (90.1 kg)  ECOG FS:1 - Symptomatic but completely ambulatory GENERAL: Patient is a tired appearing, unwell patient.  Non toxic HEENT:  Sclerae anicteric.  Thrush present, but markedly improved NODES:  No cervical, supraclavicular, or axillary lymphadenopathy palpated.  BREAST EXAM:  deferred LUNGS:  Clear to auscultation bilaterally.  No wheezes or rhonchi. HEART:  Tachycardic, regular rhythm. No murmur appreciated. ABDOMEN:  Soft, nontender.  Positive, normoactive bowel sounds. No organomegaly palpated. MSK:  No focal spinal tenderness to palpation. Full range of motion bilaterally in the upper extremities. EXTREMITIES:  No peripheral edema.   SKIN:  Clear with no obvious rashes or skin changes. No nail dyscrasia. NEURO:  Nonfocal. Well oriented.  Appropriate affect.        LAB RESULTS:  CMP     Component Value Date/Time   NA 131 (L) 07/11/2019 1039   NA 137 05/10/2018 1535   K 4.3 07/11/2019 1039   CL 92 (L) 07/11/2019 1039   CO2 24 07/11/2019 1039   GLUCOSE 116 (H) 07/11/2019 1039   BUN 11 07/11/2019 1039   BUN 7 05/10/2018 1535   CREATININE 0.89 07/11/2019 1039   CREATININE 0.81 07/03/2019 0919   CALCIUM 10.2 07/11/2019 1039   PROT 8.2 (H) 07/11/2019 1039   PROT 7.2 05/10/2018 1535   ALBUMIN 3.7 07/11/2019 1039   ALBUMIN 4.2 05/10/2018 1535   AST 10 (L) 07/11/2019 1039   AST 54 (H) 07/03/2019 0919   ALT 33 07/11/2019 1039   ALT 102 (H) 07/03/2019 0919   ALKPHOS 79 07/11/2019 1039   BILITOT 2.3 (H) 07/11/2019 1039   BILITOT <0.2 (L) 07/03/2019 0919   GFRNONAA >60 07/11/2019 1039   GFRNONAA >60 07/03/2019 0919   GFRAA >60 07/11/2019 1039   GFRAA >60 07/03/2019 0919    No results found for: TOTALPROTELP, ALBUMINELP, A1GS, A2GS, BETS, BETA2SER, GAMS, MSPIKE, SPEI  No results found for: KPAFRELGTCHN, LAMBDASER, KAPLAMBRATIO  Lab Results  Component Value Date   WBC 0.3 (LL) 07/11/2019   NEUTROABS 0.0 (LL) 07/11/2019   HGB 11.1 (L) 07/11/2019   HCT 32.5 (L) 07/11/2019   MCV 92.9 07/11/2019   PLT 178 07/11/2019    _0 @  No results found for: LABCA2  No components found for: [id number]  No results for input(s): INR in the last 168 hours.  No results found for: LABCA2  No results found for: SVX793  No results found for: JQZ009  No results found for: QZR007  No results found for: CA2729  No components found for: HGQUANT  No results found for: CEA1 / No results found for: CEA1   No results found for: AFPTUMOR  No results found for: CHROMOGRNA  No results found for: PSA1  Appointment on 07/11/2019  Component Date Value Ref Range Status  .  Sodium 07/11/2019 131* 135 - 145 mmol/L Final  . Potassium 07/11/2019 4.3  3.5 - 5.1 mmol/L Final  . Chloride 07/11/2019 92* 98 - 111 mmol/L Final  . CO2 07/11/2019 24   22 - 32 mmol/L Final  . Glucose, Bld 07/11/2019 116* 70 - 99 mg/dL Final  . BUN 07/11/2019 11  6 - 20 mg/dL Final  . Creatinine, Ser 07/11/2019 0.89  0.44 - 1.00 mg/dL Final  . Calcium 07/11/2019 10.2  8.9 - 10.3 mg/dL Final  . Total Protein 07/11/2019 8.2* 6.5 - 8.1 g/dL Final  . Albumin 07/11/2019 3.7  3.5 - 5.0 g/dL Final  . AST 07/11/2019 10* 15 - 41 U/L Final  . ALT 07/11/2019 33  0 - 44 U/L Final  . Alkaline Phosphatase 07/11/2019 79  38 - 126 U/L Final  . Total Bilirubin 07/11/2019 2.3* 0.3 - 1.2 mg/dL Final  . GFR calc non Af Amer 07/11/2019 >60  >60 mL/min Final  . GFR calc Af Amer 07/11/2019 >60  >60 mL/min Final  . Anion gap 07/11/2019 15  5 - 15 Final   Performed at [facility], [place] [address], [place], [place] [address]  . WBC 07/11/2019 0.3* 4.0 - 10.5 K/uL Final   Comment: This critical result has verified and been called to [place] by Rolland Porter on 07 15 2020 at 1059, and has been read back.  This critical result has verified and been called to [place], RN by Rolland Porter on 07 15 2020 at 1101, and has been read back.    Marland Kitchen RBC 07/11/2019 3.50* 3.87 - 5.11 MIL/uL Final  . Hemoglobin 07/11/2019 11.1* 12.0 - 15.0 g/dL Final  . HCT 07/11/2019 32.5* 36.0 - 46.0 % Final  . MCV 07/11/2019 92.9  80.0 - 100.0 fL Final  . MCH 07/11/2019 31.7  26.0 - 34.0 pg Final  . MCHC 07/11/2019 34.2  30.0 - 36.0 g/dL Final  . RDW 07/11/2019 17.1* 11.5 - 15.5 % Final  . Platelets 07/11/2019 178  150 - 400 K/uL Final  . nRBC 07/11/2019 0.0  0.0 - 0.2 % Final  . Neutrophils Relative % 07/11/2019 4  % Final  . Neutro Abs 07/11/2019 0.0* 1.7 - 7.7 K/uL Final   This critical result has verified and been called to [place] by Dorian Furnace on 07 15 2020 at 1123, and has been read back.   . Lymphocytes Relative 07/11/2019 89  % Final  . Lymphs Abs 07/11/2019 0.3* 0.7 - 4.0 K/uL Final  . Monocytes Relative 07/11/2019 7  % Final  . Monocytes Absolute  07/11/2019 0.0* 0.1 - 1.0 K/uL Final  . Eosinophils Relative 07/11/2019 0  % Final  . Eosinophils Absolute 07/11/2019 0.0  0.0 - 0.5 K/uL Final  . Basophils Relative 07/11/2019 0  % Final  . Basophils Absolute 07/11/2019 0.0  0.0 - 0.1 K/uL Final  . Immature Granulocytes 07/11/2019 0  % Final  . Abs Immature Granulocytes 07/11/2019 0.00  0.00 - 0.07 K/uL Final   Performed at [facility], [place] [address] [place], [place] [address]    (this displays the last labs from the last 3 days)  No results found for: TOTALPROTELP, ALBUMINELP, A1GS, A2GS, BETS, BETA2SER, GAMS, MSPIKE, SPEI (this displays SPEP labs)  No results found for: KPAFRELGTCHN, LAMBDASER, KAPLAMBRATIO (kappa/lambda light chains)  Lab Results  Component Value Date   HGBA 97.4 12/16/2016   (Hemoglobinopathy evaluation)   No results found for:  LDH  No results found for: IRON, TIBC, IRONPCTSAT (Iron and TIBC)  No results found for: FERRITIN  Urinalysis    Component Value Date/Time   COLORURINE YELLOW 06/26/2019 1300   APPEARANCEUR HAZY (A) 06/26/2019 1300   LABSPEC 1.019 06/26/2019 1300   PHURINE 6.0 06/26/2019 1300   GLUCOSEU NEGATIVE 06/26/2019 1300   HGBUR NEGATIVE 06/26/2019 1300   BILIRUBINUR NEGATIVE 06/26/2019 1300   KETONESUR NEGATIVE 06/26/2019 1300   PROTEINUR NEGATIVE 06/26/2019 1300   UROBILINOGEN 4.0 (H) 01/13/2018 1417   NITRITE NEGATIVE 06/26/2019 1300   LEUKOCYTESUR LARGE (A) 06/26/2019 1300     STUDIES:  No results found.   ELIGIBLE FOR AVAILABLE RESEARCH PROTOCOL: no   ASSESSMENT: 33 y.o. [place] woman status post right breast biopsy x2 and right axillary lymph node biopsy 04/19/2019 for a clinical T4 N2 MX, stage IIIC invasive ductal carcinoma, grade 3, triple negative, with an MIB-1 of 90%  (a) chest CT scan 04/11/2019 shows multiple subcentimeter lung nodules  (b) PET scan denied by insurance, CT A/P and bone scan due 05/09/2019  (c) baseline breast MRI  04/30/2019 shows involvement of all 4 quadrants in the right breast as well as right axillary subpectoral and internal mammary lymphadenopathy.  Left breast was unremarkable  (1) neoadjuvant chemotherapy consisting of  (a) pembrolizumab started 05/03/2019, held after first dose because of elevated liver function tests  (b) paclitaxel weekly x12 with carboplatin every 21 days x 4, started 05/08/2019   (i) tolerated carboplatin AUC 5-day 1 poorly, switched to AUC 2 on day 1 day 8   (ii) cycle 2 delayed 1 week because of elevated liver function tests   (iii) carboplatin and paclitaxel discontinued after 5 doses because of neuropathy, with evidence of response  (c) cyclophosphamide and doxorubicin in dose dense fashion x4 starting 07/03/2019   (2) definitive surgery pending  (3) adjuvant radiation to follow  (4) genetics testing  (a) genetic testing 05/14/2019 through the Common Hereditary Gene Panel offered by [facility] found no deleterious mutations in APC, ATM, AXIN2, BARD1, BMPR1A, BRCA1, BRCA2, BRIP1, CDH1, CDK4, CDKN2A (p14ARF), CDKN2A (p16INK4a), CHEK2, CTNNA1, DICER1, EPCAM (Deletion/duplication testing only), GREM1 (promoter region deletion/duplication testing only), KIT, MEN1, MLH1, MSH2, MSH3, MSH6, MUTYH, NBN, NF1, NHTL1, PALB2, PDGFRA, PMS2, POLD1, POLE, PTEN, RAD50, RAD51C, RAD51D, RNF43, SDHB, SDHC, SDHD, SMAD4, SMARCA4. STK11, TP53, TSC1, TSC2, and VHL.  The following genes were evaluated for sequence changes only: SDHA and HOXB13 c.251G>A variant only  (5) goserelin started 05/08/2019   PLAN:  Marla's mouth appears better as compared to yesterday.  Since she took her oral fluconazole today, I will not give it to her.  She notes that receving the IV helped some, and she wants to receive this tomorrow as well.  That is reasonable, so long as she hasn't already taken the fluconazole tomorrow.  [name] will continue on IV fluids, and as her mouth heals up, she should start feeling  better.  She understands this.    She will return for the next two days for IV fluids and I will see her back next week for her treatment.  She was recommended to continue with the appropriate pandemic precautions. She knows to call in the meantime for any questions or concerns.    A total of (20) minutes of face-to-face time was spent with this patient with greater than 50% of that time in counseling and care-coordination.  Wilber Bihari, NP 07/12/19 8:55 AM Medical Oncology and Hematology [facility] [address]  [place], [place] [address] Tel. [phone]    Fax. [phone]

## 2019-07-13 ENCOUNTER — Other Ambulatory Visit: Payer: Self-pay

## 2019-07-13 ENCOUNTER — Other Ambulatory Visit: Payer: Self-pay | Admitting: Adult Health

## 2019-07-13 ENCOUNTER — Inpatient Hospital Stay: Payer: Medicaid Other

## 2019-07-13 VITALS — BP 108/82 | HR 126 | Temp 98.0°F | Resp 20

## 2019-07-13 DIAGNOSIS — Z5111 Encounter for antineoplastic chemotherapy: Secondary | ICD-10-CM | POA: Diagnosis not present

## 2019-07-13 DIAGNOSIS — C50811 Malignant neoplasm of overlapping sites of right female breast: Secondary | ICD-10-CM

## 2019-07-13 DIAGNOSIS — R110 Nausea: Secondary | ICD-10-CM

## 2019-07-13 DIAGNOSIS — K1230 Oral mucositis (ulcerative), unspecified: Secondary | ICD-10-CM

## 2019-07-13 DIAGNOSIS — Z171 Estrogen receptor negative status [ER-]: Secondary | ICD-10-CM

## 2019-07-13 MED ORDER — HEPARIN SOD (PORK) LOCK FLUSH 100 UNIT/ML IV SOLN
500.0000 [IU] | Freq: Once | INTRAVENOUS | Status: AC | PRN
  Administered 2019-07-13: 500 [IU]
  Filled 2019-07-13: qty 5

## 2019-07-13 MED ORDER — FLUCONAZOLE IN SODIUM CHLORIDE 400-0.9 MG/200ML-% IV SOLN
400.0000 mg | INTRAVENOUS | Status: DC
  Filled 2019-07-13: qty 200

## 2019-07-13 MED ORDER — FLUCONAZOLE IN SODIUM CHLORIDE 400-0.9 MG/200ML-% IV SOLN
400.0000 mg | INTRAVENOUS | Status: DC
  Administered 2019-07-13: 400 mg via INTRAVENOUS
  Filled 2019-07-13: qty 200

## 2019-07-13 MED ORDER — ONDANSETRON HCL 4 MG/2ML IJ SOLN: 8.0000 mg | Freq: Once | INTRAMUSCULAR | Status: DC

## 2019-07-13 MED ORDER — SODIUM CHLORIDE 0.9% FLUSH
10.0000 mL | INTRAVENOUS | Status: DC | PRN
  Administered 2019-07-13: 13:00:00 10 mL
  Filled 2019-07-13: qty 10

## 2019-07-13 MED ORDER — ONDANSETRON HCL 4 MG/2ML IJ SOLN
8.0000 mg | Freq: Once | INTRAMUSCULAR | Status: AC
  Administered 2019-07-13: 8 mg via INTRAVENOUS

## 2019-07-13 MED ORDER — SODIUM CHLORIDE 0.9 % IV SOLN
INTRAVENOUS | Status: DC
  Administered 2019-07-13: 09:00:00 via INTRAVENOUS
  Filled 2019-07-13 (×2): qty 250

## 2019-07-13 MED ORDER — ONDANSETRON HCL 4 MG/2ML IJ SOLN
INTRAMUSCULAR | Status: AC
  Filled 2019-07-13: qty 4

## 2019-07-13 NOTE — Patient Instructions (Signed)
[facility] Discharge Instructions for Patients    To help prevent nausea and vomiting after your treatment, we encourage you to take your nausea medication as directed  If you develop nausea and vomiting that is not controlled by your nausea medication, call the clinic.   BELOW ARE SYMPTOMS THAT SHOULD BE REPORTED IMMEDIATELY:  *FEVER GREATER THAN 100.5 F  *CHILLS WITH OR WITHOUT FEVER  NAUSEA AND VOMITING THAT IS NOT CONTROLLED WITH YOUR NAUSEA MEDICATION  *UNUSUAL SHORTNESS OF BREATH  *UNUSUAL BRUISING OR BLEEDING  TENDERNESS IN MOUTH AND THROAT WITH OR WITHOUT PRESENCE OF ULCERS  *URINARY PROBLEMS  *BOWEL PROBLEMS  UNUSUAL RASH Items with * indicate a potential emergency and should be followed up as soon as possible.  Feel free to call the clinic should you have any questions or concerns. The clinic phone number is [phone].  Please show the [facility] at check-in to the Emergency Department and triage nurse.    Neutropenia (Low white count) Neutropenia is a condition that occurs when you have a lower-than-normal level of a type of white blood cell (neutrophil) in your body. Neutrophils are made in the spongy center of large bones (bone marrow), and they fight infections. Neutrophils are your body's main defense against bacterial and fungal infections. The fewer neutrophils you have and the longer your body remains without them, the greater your risk of getting a severe infection. What are the causes? This condition can occur if your body uses up or destroys neutrophils faster than your bone marrow can make them. Neutropenia may be caused by:  A bacterial or fungal infection.  Allergic disorders.  Reactions to some medicines.  An autoimmune disease.  An enlarged spleen. This condition can also occur if your bone marrow does not produce enough neutrophils. This problem may be caused by:  Cancer.  Cancer treatments, such as radiation or  chemotherapy.  Viral infections.  Medicines, such as phenytoin.  Vitamin B12 deficiency.  Diseases of the bone marrow.  Environmental toxins, such as insecticides. What are the signs or symptoms? This condition does not usually cause symptoms. If symptoms are present, they are usually caused by an underlying infection. Symptoms of an infection may include:  Fever.  Chills.  Swollen glands.  Oral or anal ulcers.  Cough and shortness of breath.  Rash.  Skin infection.  Fatigue. How is this diagnosed? Your health care provider may suspect neutropenia if you have:  A condition that may cause neutropenia.  Symptoms during or after treatment for cancer.  Symptoms of infection, especially fever.  Frequent and unusual infections. This condition is diagnosed based on your medical history and a physical exam. Tests will also be done, such as:  A complete blood count (CBC).  A procedure to collect a sample of bone marrow for examination (bone marrow biopsy).  A chest X-ray.  A urine culture.  A blood culture. How is this treated? Treatment depends on the underlying cause and severity of your condition. Mild neutropenia may not require treatment. Treatment may include medicines, such as:  Antibiotic medicine given through an IV.  Antiviral medicines.  Antifungal medicines.  A medicine to increase neutrophil production (colony-stimulating factor). You may get this drug through an IV or by injection.  Steroids given through an IV. If an underlying condition is causing neutropenia, you may need treatment for that condition. If medicines or cancer treatments are causing neutropenia, your health care provider may have you stop the medicines or treatment.  Follow these instructions at home: Medicines   Take over-the-counter and prescription medicines only as told by your health care provider.  Get a seasonal flu shot (influenza vaccine).  Avoid people who received  a vaccine in the past 30 days if that vaccine contained a live version of the germ (live vaccine). You should not get a live vaccine. Common live vaccines are polio, MMR, chicken pox, and shingles vaccines. Eating and drinking  Do not share food utensils.  Do not eat unpasteurized foods.  Do not eat raw or undercooked meat, eggs, or seafood.  Do not eat unwashed, raw fruits or vegetables. Lifestyle  Avoid exposure to groups of people or children.  Avoid being around people who are sick.  Avoid being around dirt or dust, such as in construction areas or gardens.  Do not provide direct care for pets. Avoid animal droppings. Do not clean litter boxes and bird cages.  Do not have sex unless your health care provider has approved. Hygiene   Bathe daily.  Clean the area between the genitals and the anus (perineal area) after you urinate or have a bowel movement. If you are female, wipe from front to back.  Brush your teeth with a soft toothbrush before and after meals.  Do not use a regular razor. Use an electric razor to remove hair.  Wash your hands often. Make sure others who come in contact with you also wash their hands. If soap and water are not available, use hand sanitizer. General instructions  Follow any precautions as told by your health care provider to reduce your risk for injury or infection.  Take actions to avoid cuts and burns. For example: ? Be cautious when you use knives. Always cut away from yourself. ? Keep knives in protective sheaths or guards when not in use. ? Use oven mitts when you cook with a hot stove, oven, or grill. ? Stand a safe distance away from open fires.  Do not use tampons, enemas, or rectal suppositories unless your health care provider has approved.  Keep all follow-up visits as told by your health care provider. This is important. Contact a health care provider if:  You have: ? A sore throat. ? A warm, red, or tender area on your  skin. ? A cough. ? Frequent or painful urination. ? Vaginal discharge or itching.  You develop: ? Sores in your mouth or anus. ? Swollen lymph nodes. ? Red streaks on the skin. ? A rash. Get help right away if:  You have: ? A fever. ? Chills, or you start to shake.  You feel: ? Nauseous, or you vomit. ? Very fatigued. ? Short of breath. Summary  Neutropenia is a condition that occurs when you have a lower-than-normal level of a type of white blood cell (neutrophil) in your body.  This condition can occur if your body uses up or destroys neutrophils faster than your bone marrow can make them.  Treatment depends on the underlying cause and severity of your condition. Mild neutropenia may not require treatment.  Follow any precautions as told by your health care provider to reduce your risk for injury or infection. This information is not intended to replace advice given to you by your health care provider. Make sure you discuss any questions you have with your health care provider. Document Released: 06/04/2002 Document Revised: 09/28/2018 Document Reviewed: 09/28/2018 Elsevier Patient Education  2020 [employer].    Oral Mucositis Oral mucositis is a mouth condition that  may develop as a result of treatments for cancer. Sores may appear on your lips, gums, tongue, throat, and the top (roof) or bottom (floor) of your mouth. What are the causes? This condition can happen to anyone who is being treated with cancer therapies, including:  Cancer medicines (chemotherapy).  Radiation therapy.  Bone marrow transplants and stem cell transplants. Cancer treatments can damage the lining of the mouth, which causes this condition. Oral mucositis is not caused by infection. However, the sores can become infected after they form. Infection can make oral mucositis worse. What increases the risk? The following factors may make you more likely to develop this condition:  Having poor  oral hygiene.  Having dental problems or oral diseases.  Using products that contain nicotine or tobacco, such as cigarettes, chewing tobacco, and e-cigarettes.  Drinking alcohol.  Having other medical conditions, such as diabetes, HIV, AIDS, or kidney disease.  Not drinking enough clear fluids.  Wearing dentures that do not fit correctly.  Having cancers that primarily affect the blood.  Having cancers of the head and neck.  Receiving radiation therapy to the head and neck region. What are the signs or symptoms? Symptoms of this condition can vary from mild to severe. Symptoms are usually seen 7-10 days after cancer treatment has started. They include:  Mouth sores. These sores may bleed.  Color changes inside the mouth. Red, shiny areas may appear.  White patches or pus in the mouth.  Pain in the mouth and throat. This can make it painful to speak and swallow.  Dryness and a burning feeling in the mouth.  Saliva that is dry and thick.  Trouble eating, drinking, and swallowing. This can lead to weight loss. How is this diagnosed? This condition can be diagnosed with a physical exam. In some cases, lab tests or cultures may be done to check for an associated infection. How is this treated? Treatment depends on the severity of the condition. Oral mucositis often heals on its own. Sometimes, changes in the cancer treatment can help. Treatment may include medicines, such as:  An antibiotic medicine to fight infection, if present.  Medicine to help the cells in your mouth heal more quickly. Medicine may also be given to help control pain. This may include:  Pain relievers that are swished around in the mouth. These make the mouth numb to ease the pain (topical anesthetics).  Mouth rinses.  Prescribed, medicated gels. The gel coats the mouth. This protects nerve endings and lessens the pain.  Pain medicines. Follow these instructions at home: Medicines  Take or  apply over-the-counter and prescription medicines only as told by your health care provider.  If you were prescribed an antibiotic medicine, take or apply it as told by your health care provider. Do not stop using the antibiotic even if you start to feel better.  Do not use products that contain benzocaine (including numbing gels) to treat mouth pain in children who are younger than 2 years. These products may cause a rare but serious blood condition. Eating and drinking   Talk to a diet and nutrition specialist (dietitian) about what you should eat and drink if you have mucositis.  Drink high-nutrition and high-calorie shakes or supplements.  Eat bland and soft foods that are easy to eat.  Drink enough fluid to keep your urine pale yellow.  Do not eat foods that are hot, spicy, citrus, or hard to swallow.  Do not drink alcohol. Lifestyle  Keep your mouth clean and germ-free. To maintain good oral hygiene: ? Brush your teeth carefully with a soft toothbrush at least two times each day. Use a gentle toothpaste. Ask your health care provider to recommend the right toothpaste for you. ? Use a soft sponge (oral swab) to clean your mouth and teeth instead of a toothbrush if mouth sores are severe. ? Floss your teeth every day. ? Have your teeth cleaned regularly as recommended by your dentist. ? Rinse your mouth after every meal or as directed by your health care provider. Do not use mouthwash that contains alcohol. Ask your health care provider for a mouthwash or mouth rinse recommendation.  Do not use any products that contain nicotine or tobacco, such as cigarettes and e-cigarettes. If you need help quitting, ask your health care provider. General instructions  Follow instructions from your health care provider about: ? Cleaning mouth sores. ? Taking out your dentures. ? Changing your diet or finding other ways to get nutrients. This is important if you are losing weight.  If  your lips are dry or cracked, apply a water-based moisturizer to your lips as needed.  Try sucking on ice chips or sugar-free frozen pops. This may help with pain. This also keeps your mouth moist.  Keep all follow-up visits as told by your health care provider. This is important. Contact a health care provider if:  You have mouth pain or throat pain.  You are having more trouble swallowing.  Your symptoms get worse.  You have new symptoms.  Your pain is not controlled with medicine.  You have trouble speaking. Get help right away if you:  Have a fever.  Cannot swallow solid food or liquids.  Have a lot of bleeding in your mouth.  Develop new, open, or draining sores in your mouth. Summary  Oral mucositis is a mouth condition that may develop as a result of treatments for cancer. Sores may appear on your lips, gums, tongue, throat, and the top (roof) or bottom (floor) of your mouth.  Cancer treatments can damage the lining of the mouth, which causes this condition.  Treatment depends on how severe the condition is. It may include medicine to fight infection, medicine to ease pain, or medicine to help the cells in your mouth heal more quickly. This information is not intended to replace advice given to you by your health care provider. Make sure you discuss any questions you have with your health care provider. Document Released: 07/30/2011 Document Revised: 03/13/2018 Document Reviewed: 12/29/2017 Elsevier Patient Education  2020 [employer].

## 2019-07-14 ENCOUNTER — Other Ambulatory Visit: Payer: Self-pay

## 2019-07-14 ENCOUNTER — Inpatient Hospital Stay: Payer: Medicaid Other

## 2019-07-14 VITALS — BP 111/64 | HR 108 | Temp 98.6°F | Resp 18

## 2019-07-14 DIAGNOSIS — C50811 Malignant neoplasm of overlapping sites of right female breast: Secondary | ICD-10-CM

## 2019-07-14 DIAGNOSIS — Z5111 Encounter for antineoplastic chemotherapy: Secondary | ICD-10-CM | POA: Diagnosis not present

## 2019-07-14 MED ORDER — SODIUM CHLORIDE 0.9% FLUSH
10.0000 mL | INTRAVENOUS | Status: DC | PRN
  Administered 2019-07-14: 10 mL
  Filled 2019-07-14: qty 10

## 2019-07-14 MED ORDER — SODIUM CHLORIDE 0.9 % IV SOLN
INTRAVENOUS | Status: DC
  Administered 2019-07-14: 08:00:00 via INTRAVENOUS
  Filled 2019-07-14 (×2): qty 250

## 2019-07-14 MED ORDER — HEPARIN SOD (PORK) LOCK FLUSH 100 UNIT/ML IV SOLN
500.0000 [IU] | Freq: Once | INTRAVENOUS | Status: AC | PRN
  Administered 2019-07-14: 500 [IU]
  Filled 2019-07-14: qty 5

## 2019-07-14 MED ORDER — ONDANSETRON HCL 4 MG/2ML IJ SOLN
8.0000 mg | Freq: Once | INTRAMUSCULAR | Status: AC
  Administered 2019-07-14: 08:00:00 8 mg via INTRAVENOUS

## 2019-07-14 MED ORDER — ONDANSETRON HCL 4 MG/2ML IJ SOLN
INTRAMUSCULAR | Status: AC
  Filled 2019-07-14: qty 4

## 2019-07-17 MED ORDER — FULVESTRANT 250 MG/5ML IM SOLN
INTRAMUSCULAR | Status: AC
  Filled 2019-07-17: qty 10

## 2019-07-17 MED ORDER — DENOSUMAB 120 MG/1.7ML ~~LOC~~ SOLN
SUBCUTANEOUS | Status: AC
  Filled 2019-07-17: qty 1.7

## 2019-07-17 NOTE — Progress Notes (Signed)
[facility]  [phone] [phone]    ID: Terri Estes DOB: Nov 13, 1986  MR#: [id number]  FXT#:[id number]  Patient Care Team: Patient, No Pcp Per as PCP - General (General Practice) Alphonsa Overall, MD as Consulting Physician (General Surgery) Magrinat, [name], MD as Consulting Physician (Oncology) Kyung Rudd, MD as Consulting Physician (Radiation Oncology) Rockwell Germany, RN as Oncology Nurse Navigator Mauro Kaufmann, RN as Oncology Nurse Navigator Donnamae Jude, MD as Consulting Physician (Obstetrics and Gynecology) Pavelock, Ralene Bathe, MD as Consulting Physician (Internal Medicine) OTHER MD: [facility] is [phone]   CHIEF COMPLAINT: Triple negative breast cancer  CURRENT TREATMENT: Neoadjuvant chemotherapy[/immunotherapy]/goserelin   INTERVAL HISTORY: Terri Estes returns today for follow-up and treatment of her estrogen negative breast cancer and is undergoing neoadjuvant chemotherapy.  She stopped receiving Paclitaxel and Carboplatin after 5 cycles due to peripheral neuropathy.  Now, she is receiving Doxorubicin and Cyclophspahmide given on day 1 of a 14 day cycle with onpro support.  Today is cycle 2 day 1.    REVIEW OF SYSTEMS: Terri Estes continues to feel weak and tired.  She notes she has continued right ear pain.  She has not had any fever or chills.  Her mouth is improved.  She is having difficulty eating and drinking because of her throat pain.  She has no bowel/bladder changes.  She does note that she has some lower back pain that has been increasing recently, as well as intermittent right leg numbness.  She denies any focal weakness, bowel/bladder incontinence, confusion, taste change, vision changes, or headaches. She denies any blood in her stool or black/tarry stool.   Otherwise a detailed ROS was non contributory.      HISTORY OF CURRENT ILLNESS: From the original intake note:  Terri Estes presented  with swelling along the left side of the face with neck pain. She then underwent a neck CT on 04/11/2019 showing: Enlarged left level 2 lymph nodes with associated inflammatory change including stranding of the adjacent fat and thickening of the platysma. While malignancy is not excluded, this is most concerning for infection. No focal abscess is present. Hypoattenuation within 1 of the left level 2 lymph nodes likely represents central necrosis or suppuration of the node. No primary malignancy or other abscess. Left upper lobe pulmonary nodules may be inflammatory. The largest measures 0.7 x 0.7 x 0.6 cm. CT of the chest with contrast may be useful for further evaluation.  She also underwent a chest CT on the same day showing: Complex right breast mass measuring 5.6 x 4.5 x 4.7 cm consistent with a primary breast carcinoma. Malignant right axillary adenopathy measuring up to 3.1 cm. Multiple bilateral pulmonary nodules are concerning for metastatic disease. Given the right breast mass, the left cervical lymph nodes are more likely malignant.  She then underwent bilateral diagnostic mammography with tomography and right breast ultrasonography at [facility] on 04/19/2019 showing: Breast Density Category C; findings which are highly suspicious for multicentric inflammatory right breast cancer with right axillary nodal metastatic disease; no mammographic evidence of malignancy involving the left breast.  Accordingly on 04/19/2019 she proceeded to biopsy of the right breast area in question. The pathology from this procedure showed ([id number]): invasive ductal carcinoma, grade III, upper inner quadrant, 12:30 o'clock, 5.0 cm from the nipple. Prognostic indicators significant for: estrogen receptor, 0% negative and progesterone receptor, 0% negative. Proliferation marker Ki67 at 90%. HER2 negative (0+).  Additional biopsies of the right breast and  right axilla were performed on the same day. The pathology  from this procedure showed ([id number]): 2. Breast, right, needle core biopsy, satellite mass UOQ, 10 o'clock, 8cmfn  - Invasive ductal carcinoma, grade III 3. Lymph node, needle/core biopsy, level 1 right axilla   - Invasive ductal carcinoma, grade III  The patient's subsequent history is as detailed below.   PAST MEDICAL HISTORY: Past Medical History:  Diagnosis Date   Anemia    after pregnancy   Anxiety    severe not taking meds   Asthma    inhaler  4x per day   Chronic pain    Family history of breast cancer    History of substance abuse ([place])    Pregnancy induced hypertension    takes medication now.   Psoriasis    Trichomonas infection    Vaginal Pap smear, abnormal      PAST SURGICAL HISTORY: Past Surgical History:  Procedure Laterality Date   CESAREAN SECTION  05/26/2008   CESAREAN SECTION N/A 07/10/2017   Procedure: CESAREAN SECTION;  Surgeon: Florian Buff, MD;  Location: [facility];  Service: Obstetrics;  Laterality: N/A;   CESAREAN SECTION N/A 09/23/2018   Procedure: CESAREAN SECTION;  Surgeon: Donnamae Jude, MD;  Location: [facility];  Service: Obstetrics;  Laterality: N/A;   COLPOSCOPY     PORTACATH PLACEMENT Left 04/30/2019   Procedure: INSERTION PORT-A-CATH WITH ULTRASOUND;  Surgeon: Alphonsa Overall, MD;  Location: [facility];  Service: General;  Laterality: Left;  Left Subclavian vein   WISDOM TOOTH EXTRACTION       FAMILY HISTORY: Family History  Problem Relation Age of Onset   Kidney disease Maternal Grandmother    Breast cancer Maternal Grandmother 28       metastatic to liver; d. 32   Kidney disease Maternal Grandfather    Kidney disease Paternal Grandmother    Diabetes Paternal Grandmother    Breast cancer Paternal Grandmother    Kidney disease Paternal Grandfather    Breast cancer Paternal Aunt    Cerebral palsy Child    Breast cancer Paternal Great-grandmother    Pancreatic cancer Other        PGMs  brother    Throat cancer Paternal Uncle    Breast cancer Other        PGMs sister  (Updated 04/25/2019) Terri Estes's father is living at age 54. Patients' mother is also living at age 42. [place] notes, however, that she was raised by her grandmother.) The patient has 0 brothers and 5 sisters. Patient denies anyone in her family having ovarian, prostate, or pancreatic cancer. Her maternal grandmother was diagnosed with breast cancer, unsure what age. On her father's side, she reports her grandmother had cysts in her breasts, her great-aunt might have had breast cancer, and her great-grandmother was diagnosed with breast cancer at an older age.   GYNECOLOGIC HISTORY:  No LMP recorded. (Menstrual status: Chemotherapy).  She had an emergent C-section in 09/23/2018 for abruptio placenta at 30 weeks pregnancy, also has a history of eclampsia Menarche:    years old Age at first live birth: 33 years old [place] P: 3 LMP: 04/18/2019 Contraceptive: none HRT: none  Hysterectomy?: no BSO?: no   SOCIAL HISTORY: (Current as of 04/25/2019) Terri Estes is currently unemployed. She previously worked at [employer]. She is currently engaged. Fiance Terri Estes runs a Midwife and works other odd jobs. Daughter Terri Estes, age 99, was abused by her father at 26 months and has cerebral palsy.  Son Terri Estes, age 63, has severe asthma and is developmentally disabled. Daughter Terri Estes was born on 09/23/2018 prematurely and remains on oxygen at home.    ADVANCED DIRECTIVES: not in place.    HEALTH MAINTENANCE: Social History   Tobacco Use   Smoking status: Former Smoker    Packs/day: 0.50    Years: 5.00    Pack years: 2.50    Types: Cigarettes    Quit date: 04/10/2019    Years since quitting: 0.2   Smokeless tobacco: Never Used   Tobacco comment: trying to quit  Substance Use Topics   Alcohol use: No   Drug use: Yes    Frequency: 7.0 times per week    Types: Marijuana    Comment: subutex     Colonoscopy: never done  PAP: 05/10/2018  Bone density: never done Mammography: first performed following abnormal CT  No Known Allergies  Current Outpatient Medications  Medication Sig Dispense Refill   acyclovir ointment (ZOVIRAX) 5 % Apply 1 application topically 2 (two) times daily as needed. Mix 1 part acyclovir cream with 1 part over the counter hydrocortisone 1% cream and apply to affected area two times a day as needed 5 g 0   albuterol (PROVENTIL HFA;VENTOLIN HFA) 108 (90 Base) MCG/ACT inhaler Inhale 2 puffs into the lungs 4 (four) times daily. 1 Inhaler 2   buprenorphine (SUBUTEX) 8 MG SUBL SL tablet Place 8 mg under the tongue 2 (two) times daily.      cholestyramine (QUESTRAN) 4 g packet Take 1 packet (4 g total) by mouth 3 (three) times daily with meals. 60 each 12   ciprofloxacin (CIPRO) 500 MG tablet Take 1 tablet (500 mg total) by mouth 2 (two) times daily. 14 tablet 0   cyclobenzaprine (FLEXERIL) 10 MG tablet Take 1 tablet (10 mg total) by mouth 3 (three) times daily as needed for muscle spasms. 90 tablet 2   dexamethasone (DECADRON) 4 MG tablet TAKE 2 TABLETS BY MOUTH DAILY ON THE DAY AFTER CHEMOTHERAPY, AND THEN TAKE 2 TABLETS TWICE DAILY FOR 2 DAYS AFTERWARDS. TAKE WITH FOOD 30 tablet 1   enalapril (VASOTEC) 10 MG tablet TAKE 1 TABLET BY MOUTH DAILY 90 tablet 0   fluconazole (DIFLUCAN) 200 MG tablet Take 1 tablet (200 mg total) by mouth daily. 30 tablet 0   gabapentin (NEURONTIN) 300 MG capsule Take 1 capsule (300 mg total) by mouth 3 (three) times daily. 90 capsule 0   omeprazole (PRILOSEC) 40 MG capsule Take 1 capsule (40 mg total) by mouth daily. 30 capsule 5   ondansetron (ZOFRAN) 8 MG tablet TAKE 1 TABLET(8 MG) BY MOUTH EVERY 8 HOURS AS NEEDED FOR NAUSEA OR VOMITING 20 tablet 0   PARoxetine (PAXIL) 10 MG tablet Take 1 tablet (10 mg total) by mouth daily. (Patient taking differently: Take 10 mg by mouth daily. Patient is taking 2 tablets daily per MD) 30  tablet 5   Prenatal MV-Min-FA-Omega-3 (PRENATAL GUMMIES/DHA & FA) 0.4-32.5 MG CHEW Chew 2 each by mouth daily.     prochlorperazine (COMPAZINE) 10 MG tablet TAKE 1 TABLET(10 MG) BY MOUTH EVERY 6 HOURS AS NEEDED FOR NAUSEA OR VOMITING 30 tablet 1   valACYclovir (VALTREX) 1000 MG tablet Take 1 tablet (1,000 mg total) by mouth 2 (two) times daily. 60 tablet 0   Current Facility-Administered Medications  Medication Dose Route Frequency Provider Last Rate Last Dose   sodium chloride flush (NS) 0.9 % injection 10 mL  10 mL Intracatheter PRN Magrinat, [name], MD  10 mL at 07/18/19 4765   Facility-Administered Medications Ordered in Other Visits  Medication Dose Route Frequency Provider Last Rate Last Dose   0.9 % NaCl with KCl 20 mEq/ L  infusion   Intravenous Once Magrinat, [name], MD       sodium chloride flush (NS) 0.9 % injection 10 mL  10 mL Intracatheter PRN Magrinat, [name], MD         OBJECTIVE:   Vitals:   07/18/19 0928  BP: (!) 83/54  Pulse: 98  Resp: 18  Temp: (!) 97.5 F (36.4 C)  SpO2: 100%     Body mass index is 39.06 kg/m.   Wt Readings from Last 3 Encounters:  07/18/19 200 lb (90.7 kg)  07/03/19 206 lb 8 oz (93.7 kg)  06/26/19 208 lb 9.6 oz (94.6 kg)  ECOG FS:1 - Symptomatic but completely ambulatory GENERAL: Patient appears improved, but still tired, and anxious HEENT:  Sclerae anicteric.  Right TM is erythematous, bulging, injected.   NODES:  No cervical, supraclavicular, or axillary lymphadenopathy palpated.  BREAST EXAM:  deferred LUNGS:  Clear to auscultation bilaterally.  No wheezes or rhonchi. HEART:  Regular rate and regular rhythm. No murmur appreciated. ABDOMEN:  Soft, nontender.  Positive, normoactive bowel sounds. No organomegaly palpated. MSK:  No focal spinal tenderness to palpation.  EXTREMITIES:  No peripheral edema.  Strength and gait is intact SKIN:  Clear with no obvious rashes or skin changes. No nail dyscrasia. NEURO:  Nonfocal.  Well oriented.  Appropriate affect.    LAB RESULTS:  CMP     Component Value Date/Time   NA 131 (L) 07/11/2019 1039   NA 137 05/10/2018 1535   K 4.3 07/11/2019 1039   CL 92 (L) 07/11/2019 1039   CO2 24 07/11/2019 1039   GLUCOSE 116 (H) 07/11/2019 1039   BUN 11 07/11/2019 1039   BUN 7 05/10/2018 1535   CREATININE 0.89 07/11/2019 1039   CREATININE 0.81 07/03/2019 0919   CALCIUM 10.2 07/11/2019 1039   PROT 8.2 (H) 07/11/2019 1039   PROT 7.2 05/10/2018 1535   ALBUMIN 3.7 07/11/2019 1039   ALBUMIN 4.2 05/10/2018 1535   AST 10 (L) 07/11/2019 1039   AST 54 (H) 07/03/2019 0919   ALT 33 07/11/2019 1039   ALT 102 (H) 07/03/2019 0919   ALKPHOS 79 07/11/2019 1039   BILITOT 2.3 (H) 07/11/2019 1039   BILITOT <0.2 (L) 07/03/2019 0919   GFRNONAA >60 07/11/2019 1039   GFRNONAA >60 07/03/2019 0919   GFRAA >60 07/11/2019 1039   GFRAA >60 07/03/2019 0919    No results found for: TOTALPROTELP, ALBUMINELP, A1GS, A2GS, BETS, BETA2SER, GAMS, MSPIKE, SPEI  No results found for: KPAFRELGTCHN, LAMBDASER, KAPLAMBRATIO  Lab Results  Component Value Date   WBC 3.4 (L) 07/18/2019   NEUTROABS PENDING 07/18/2019   HGB 7.4 (L) 07/18/2019   HCT 22.6 (L) 07/18/2019   MCV 100.0 07/18/2019   PLT 329 07/18/2019    '@LASTCHEMISTRY' @  No results found for: LABCA2  No components found for: [id number]  No results for input(s): INR in the last 168 hours.  No results found for: LABCA2  No results found for: KCL275  No results found for: TZG017  No results found for: CBS496  No results found for: CA2729  No components found for: HGQUANT  No results found for: CEA1 / No results found for: CEA1   No results found for: AFPTUMOR  No results found for: [place]  No results found for: PSA1  Appointment on 07/18/2019  Component Date Value Ref Range Status   WBC 07/18/2019 3.4* 4.0 - 10.5 K/uL Final   RBC 07/18/2019 2.26* 3.87 - 5.11 MIL/uL Final   Hemoglobin 07/18/2019 7.4* 12.0 -  15.0 g/dL Final   HCT 07/18/2019 22.6* 36.0 - 46.0 % Final   MCV 07/18/2019 100.0  80.0 - 100.0 fL Final   MCH 07/18/2019 32.7  26.0 - 34.0 pg Final   MCHC 07/18/2019 32.7  30.0 - 36.0 g/dL Final   RDW 07/18/2019 17.7* 11.5 - 15.5 % Final   Platelets 07/18/2019 329  150 - 400 K/uL Final   nRBC 07/18/2019 1.8* 0.0 - 0.2 % Final   Performed at [facility], [place] [address], [place],  [address]   Neutrophils Relative % 07/18/2019 PENDING  % Incomplete   Neutro Abs 07/18/2019 PENDING  1.7 - 7.7 K/uL Incomplete   Band Neutrophils 07/18/2019 PENDING  % Incomplete   Lymphocytes Relative 07/18/2019 PENDING  % Incomplete   Lymphs Abs 07/18/2019 PENDING  0.7 - 4.0 K/uL Incomplete   Monocytes Relative 07/18/2019 PENDING  % Incomplete   Monocytes Absolute 07/18/2019 PENDING  0.1 - 1.0 K/uL Incomplete   Eosinophils Relative 07/18/2019 PENDING  % Incomplete   Eosinophils Absolute 07/18/2019 PENDING  0.0 - 0.5 K/uL Incomplete   Basophils Relative 07/18/2019 PENDING  % Incomplete   Basophils Absolute 07/18/2019 PENDING  0.0 - 0.1 K/uL Incomplete   WBC Morphology 07/18/2019 PENDING   Incomplete   RBC Morphology 07/18/2019 PENDING   Incomplete   Smear Review 07/18/2019 PENDING   Incomplete   Other 07/18/2019 PENDING  % Incomplete   nRBC 07/18/2019 PENDING  0 /100 WBC Incomplete   Metamyelocytes Relative 07/18/2019 PENDING  % Incomplete   Myelocytes 07/18/2019 PENDING  % Incomplete   Promyelocytes Relative 07/18/2019 PENDING  % Incomplete   Blasts 07/18/2019 PENDING  % Incomplete    (this displays the last labs from the last 3 days)  No results found for: TOTALPROTELP, ALBUMINELP, A1GS, A2GS, BETS, BETA2SER, GAMS, MSPIKE, SPEI (this displays SPEP labs)  No results found for: KPAFRELGTCHN, LAMBDASER, KAPLAMBRATIO (kappa/lambda light chains)  Lab Results  Component Value Date   HGBA 97.4 12/16/2016   (Hemoglobinopathy evaluation)   No  results found for: LDH  No results found for: IRON, TIBC, IRONPCTSAT (Iron and TIBC)  No results found for: FERRITIN  Urinalysis    Component Value Date/Time   COLORURINE YELLOW 06/26/2019 1300   APPEARANCEUR HAZY (A) 06/26/2019 1300   LABSPEC 1.019 06/26/2019 1300   PHURINE 6.0 06/26/2019 1300   GLUCOSEU NEGATIVE 06/26/2019 1300   HGBUR NEGATIVE 06/26/2019 1300   BILIRUBINUR NEGATIVE 06/26/2019 1300   KETONESUR NEGATIVE 06/26/2019 1300   PROTEINUR NEGATIVE 06/26/2019 1300   UROBILINOGEN 4.0 (H) 01/13/2018 1417   NITRITE NEGATIVE 06/26/2019 1300   LEUKOCYTESUR LARGE (A) 06/26/2019 1300     STUDIES:  No results found.   ELIGIBLE FOR AVAILABLE RESEARCH PROTOCOL: no   ASSESSMENT: 33 y.o. [place] woman status post right breast biopsy x2 and right axillary lymph node biopsy 04/19/2019 for a clinical T4 N2 MX, stage IIIC invasive ductal carcinoma, grade 3, triple negative, with an MIB-1 of 90%  (a) chest CT scan 04/11/2019 shows multiple subcentimeter lung nodules  (b) PET scan denied by insurance, CT A/P and bone scan due 05/09/2019  (c) baseline breast MRI 04/30/2019 shows involvement of all 4 quadrants in the right breast as well as right axillary subpectoral and internal mammary lymphadenopathy.  Left breast was unremarkable  (1) neoadjuvant chemotherapy consisting of  (a) pembrolizumab started 05/03/2019, held after first dose because of elevated liver function tests  (b) paclitaxel weekly x12 with carboplatin every 21 days x 4, started 05/08/2019   (i) tolerated carboplatin AUC 5-day 1 poorly, switched to AUC 2 on day 1 day 8   (ii) cycle 2 delayed 1 week because of elevated liver function tests   (iii) carboplatin and paclitaxel discontinued after 5 doses because of neuropathy, with evidence of response  (c) cyclophosphamide and doxorubicin in dose dense fashion x4 starting 07/03/2019   (2) definitive surgery pending  (3) adjuvant radiation to follow  (4) genetics  testing  (a) genetic testing 05/14/2019 through the Common Hereditary Gene Panel offered by [facility] found no deleterious mutations in APC, ATM, AXIN2, BARD1, BMPR1A, BRCA1, BRCA2, BRIP1, CDH1, CDK4, CDKN2A (p14ARF), CDKN2A (p16INK4a), CHEK2, CTNNA1, DICER1, EPCAM (Deletion/duplication testing only), GREM1 (promoter region deletion/duplication testing only), KIT, MEN1, MLH1, MSH2, MSH3, MSH6, MUTYH, NBN, NF1, NHTL1, PALB2, PDGFRA, PMS2, POLD1, POLE, PTEN, RAD50, RAD51C, RAD51D, RNF43, SDHB, SDHC, SDHD, SMAD4, SMARCA4. STK11, TP53, TSC1, TSC2, and VHL.  The following genes were evaluated for sequence changes only: SDHA and HOXB13 c.251G>A variant only  (5) goserelin started 05/08/2019   PLAN:  Terri Estes is still having difficulty this week.  She continues to have increased weakness and she has symptomatic anemia today.  She will not receive chemotherapy today.  She will instead receive one unit of blood.  I am hopeful that will improve things.  I placed orders for a spine and brain MRI due to Myelle's atypical symptoms of intermittent numbness.  I sent in Amoxicillin for her to take for her ear infection.  She was recommended to stop taking Cipro.    Terri Estes will return in one week for labs, f/u and her second cycle of chemotherapy.  She was recommended to continue with the appropriate pandemic precautions.   A total of (30) minutes of face-to-face time was spent with this patient with greater than 50% of that time in counseling and care-coordination.  Wilber Bihari, NP 07/18/19 9:38 AM Medical Oncology and Hematology [facility] [address] [place], [place] [address] Tel. [phone]    Fax. [phone]

## 2019-07-18 ENCOUNTER — Ambulatory Visit: Payer: Medicaid Other

## 2019-07-18 ENCOUNTER — Other Ambulatory Visit: Payer: Self-pay | Admitting: Medical

## 2019-07-18 ENCOUNTER — Other Ambulatory Visit: Payer: Self-pay

## 2019-07-18 ENCOUNTER — Encounter: Payer: Self-pay | Admitting: General Practice

## 2019-07-18 ENCOUNTER — Inpatient Hospital Stay (HOSPITAL_BASED_OUTPATIENT_CLINIC_OR_DEPARTMENT_OTHER): Payer: Medicaid Other | Admitting: Adult Health

## 2019-07-18 ENCOUNTER — Encounter: Payer: Self-pay | Admitting: Adult Health

## 2019-07-18 ENCOUNTER — Inpatient Hospital Stay: Payer: Medicaid Other

## 2019-07-18 ENCOUNTER — Inpatient Hospital Stay (HOSPITAL_BASED_OUTPATIENT_CLINIC_OR_DEPARTMENT_OTHER): Payer: Medicaid Other | Admitting: Medical

## 2019-07-18 VITALS — BP 83/54 | HR 98 | Temp 97.5°F | Resp 18 | Ht 60.0 in | Wt 200.0 lb

## 2019-07-18 DIAGNOSIS — C773 Secondary and unspecified malignant neoplasm of axilla and upper limb lymph nodes: Secondary | ICD-10-CM

## 2019-07-18 DIAGNOSIS — K600 Acute anal fissure: Secondary | ICD-10-CM

## 2019-07-18 DIAGNOSIS — K5900 Constipation, unspecified: Secondary | ICD-10-CM

## 2019-07-18 DIAGNOSIS — C50811 Malignant neoplasm of overlapping sites of right female breast: Secondary | ICD-10-CM

## 2019-07-18 DIAGNOSIS — R5383 Other fatigue: Secondary | ICD-10-CM

## 2019-07-18 DIAGNOSIS — G620 Drug-induced polyneuropathy: Secondary | ICD-10-CM

## 2019-07-18 DIAGNOSIS — Z171 Estrogen receptor negative status [ER-]: Secondary | ICD-10-CM

## 2019-07-18 DIAGNOSIS — D649 Anemia, unspecified: Secondary | ICD-10-CM

## 2019-07-18 DIAGNOSIS — Z5111 Encounter for antineoplastic chemotherapy: Secondary | ICD-10-CM | POA: Diagnosis not present

## 2019-07-18 DIAGNOSIS — D701 Agranulocytosis secondary to cancer chemotherapy: Secondary | ICD-10-CM

## 2019-07-18 DIAGNOSIS — K625 Hemorrhage of anus and rectum: Secondary | ICD-10-CM

## 2019-07-18 DIAGNOSIS — H9201 Otalgia, right ear: Secondary | ICD-10-CM

## 2019-07-18 DIAGNOSIS — B3781 Candidal esophagitis: Secondary | ICD-10-CM

## 2019-07-18 DIAGNOSIS — E860 Dehydration: Secondary | ICD-10-CM

## 2019-07-18 DIAGNOSIS — B370 Candidal stomatitis: Secondary | ICD-10-CM

## 2019-07-18 LAB — CBC WITH DIFFERENTIAL/PLATELET
Abs Immature Granulocytes: 0.1 10*3/uL — ABNORMAL HIGH (ref 0.00–0.07)
Band Neutrophils: 1 %
Basophils Absolute: 0 10*3/uL (ref 0.0–0.1)
Basophils Relative: 0 %
Eosinophils Absolute: 0 10*3/uL (ref 0.0–0.5)
Eosinophils Relative: 0 %
HCT: 22.6 % — ABNORMAL LOW (ref 36.0–46.0)
Hemoglobin: 7.4 g/dL — ABNORMAL LOW (ref 12.0–15.0)
Lymphocytes Relative: 40 %
Lymphs Abs: 1.4 10*3/uL (ref 0.7–4.0)
MCH: 32.7 pg (ref 26.0–34.0)
MCHC: 32.7 g/dL (ref 30.0–36.0)
MCV: 100 fL (ref 80.0–100.0)
Metamyelocytes Relative: 1 %
Monocytes Absolute: 0.3 10*3/uL (ref 0.1–1.0)
Monocytes Relative: 8 %
Myelocytes: 1 %
Neutro Abs: 1.7 10*3/uL (ref 1.7–17.7)
Neutrophils Relative %: 49 %
Platelets: 329 10*3/uL (ref 150–400)
RBC: 2.26 MIL/uL — ABNORMAL LOW (ref 3.87–5.11)
RDW: 17.7 % — ABNORMAL HIGH (ref 11.5–15.5)
WBC: 3.4 10*3/uL — ABNORMAL LOW (ref 4.0–10.5)
nRBC: 1.8 % — ABNORMAL HIGH (ref 0.0–0.2)

## 2019-07-18 LAB — ABO/RH: ABO/RH(D): A POS

## 2019-07-18 LAB — COMPREHENSIVE METABOLIC PANEL
ALT: 24 U/L (ref 0–44)
AST: 14 U/L — ABNORMAL LOW (ref 15–41)
Albumin: 2.9 g/dL — ABNORMAL LOW (ref 3.5–5.0)
Alkaline Phosphatase: 89 U/L (ref 38–126)
Anion gap: 13 (ref 5–15)
BUN: 11 mg/dL (ref 6–20)
CO2: 22 mmol/L (ref 22–32)
Calcium: 9 mg/dL (ref 8.9–10.3)
Chloride: 109 mmol/L (ref 98–111)
Creatinine, Ser: 1.05 mg/dL — ABNORMAL HIGH (ref 0.44–1.00)
GFR calc Af Amer: >60 mL/min (ref >60)
GFR calc non Af Amer: >60 mL/min (ref >60)
Glucose, Bld: 124 mg/dL — ABNORMAL HIGH (ref 70–99)
Potassium: 3.5 mmol/L (ref 3.5–5.1)
Sodium: 144 mmol/L (ref 135–145)
Total Bilirubin: 0.3 mg/dL (ref 0.3–1.2)
Total Protein: 6.4 g/dL — ABNORMAL LOW (ref 6.5–8.1)

## 2019-07-18 LAB — PREPARE RBC (CROSSMATCH)

## 2019-07-18 MED ORDER — ACETAMINOPHEN 325 MG PO TABS
650.0000 mg | ORAL_TABLET | Freq: Once | ORAL | Status: AC
  Administered 2019-07-18: 650 mg via ORAL

## 2019-07-18 MED ORDER — HYDROCORTISONE (PERIANAL) 2.5 % EX CREA: 1.0000 "application " | TOPICAL_CREAM | Freq: Two times a day (BID) | CUTANEOUS | 0 refills | Status: DC

## 2019-07-18 MED ORDER — HEPARIN SOD (PORK) LOCK FLUSH 100 UNIT/ML IV SOLN
250.0000 [IU] | INTRAVENOUS | Status: DC | PRN
  Filled 2019-07-18: qty 5

## 2019-07-18 MED ORDER — HEPARIN SOD (PORK) LOCK FLUSH 100 UNIT/ML IV SOLN
500.0000 [IU] | Freq: Every day | INTRAVENOUS | Status: AC | PRN
  Administered 2019-07-18: 14:00:00 500 [IU]
  Filled 2019-07-18: qty 5

## 2019-07-18 MED ORDER — SODIUM CHLORIDE 0.9% IV SOLUTION
250.0000 mL | Freq: Once | INTRAVENOUS | Status: AC
  Administered 2019-07-18: 250 mL via INTRAVENOUS
  Filled 2019-07-18: qty 250

## 2019-07-18 MED ORDER — SODIUM CHLORIDE 0.9% FLUSH
10.0000 mL | INTRAVENOUS | Status: DC | PRN
  Administered 2019-07-18 (×2): 10 mL
  Filled 2019-07-18: qty 10

## 2019-07-18 MED ORDER — ACETAMINOPHEN 325 MG PO TABS
ORAL_TABLET | ORAL | Status: AC
  Filled 2019-07-18: qty 2

## 2019-07-18 MED ORDER — SODIUM CHLORIDE 0.9% FLUSH
10.0000 mL | INTRAVENOUS | Status: DC | PRN
  Filled 2019-07-18: qty 10

## 2019-07-18 MED ORDER — AMOXICILLIN 500 MG PO TABS: 500.0000 mg | ORAL_TABLET | Freq: Two times a day (BID) | ORAL | 0 refills | Status: DC

## 2019-07-18 MED ORDER — SODIUM CHLORIDE 0.9% FLUSH
3.0000 mL | INTRAVENOUS | Status: DC | PRN
  Filled 2019-07-18: qty 10

## 2019-07-18 MED ORDER — DIPHENHYDRAMINE HCL 25 MG PO CAPS
25.0000 mg | ORAL_CAPSULE | Freq: Once | ORAL | Status: AC
  Administered 2019-07-18: 25 mg via ORAL

## 2019-07-18 MED ORDER — DIPHENHYDRAMINE HCL 25 MG PO CAPS
ORAL_CAPSULE | ORAL | Status: AC
  Filled 2019-07-18: qty 1

## 2019-07-18 NOTE — Progress Notes (Signed)
The patient was seen in the infusion room today.  She was up to the bathroom to have a bowel movement.  She reported that she had a large stool and developed significant rectal pain with her bowel movement then noticed that she had bright red blood per rectum.  She reports that she has constipation at times.  I discussed with the patient that she should increase her fiber intake and intake of fluids.  A prescription for Anusol HC cream was sent to her pharmacy.  I reassured her that it sounded as though she had a small tear in the rectum due to the large bowel movement and constipation.  Sandi Mealy, MHS, PA-C Physician Assistant

## 2019-07-18 NOTE — Progress Notes (Signed)
This encounter created for flush appt. Patient not seen by this RN. Chart opened to close encounter.

## 2019-07-18 NOTE — Patient Instructions (Signed)

## 2019-07-18 NOTE — Patient Instructions (Signed)
Blood Transfusion, Adult, Care After This sheet gives you information about how to care for yourself after your procedure. Your doctor may also give you more specific instructions. If you have problems or questions, contact your doctor. Follow these instructions at home:   Take over-the-counter and prescription medicines only as told by your doctor.  Go back to your normal activities as told by your doctor.  Follow instructions from your doctor about how to take care of the area where an IV tube was put into your vein (insertion site). Make sure you: ? Wash your hands with soap and water before you change your bandage (dressing). If there is no soap and water, use hand sanitizer. ? Change your bandage as told by your doctor.  Check your IV insertion site every day for signs of infection. Check for: ? More redness, swelling, or pain. ? More fluid or blood. ? Warmth. ? Pus or a bad smell. Contact a doctor if:  You have more redness, swelling, or pain around the IV insertion site.  You have more fluid or blood coming from the IV insertion site.  Your IV insertion site feels warm to the touch.  You have pus or a bad smell coming from the IV insertion site.  Your pee (urine) turns pink, red, or brown.  You feel weak after doing your normal activities. Get help right away if:  You have signs of a serious allergic or body defense (immune) system reaction, including: ? Itchiness. ? Hives. ? Trouble breathing. ? Anxiety. ? Pain in your chest or lower back. ? Fever, flushing, and chills. ? Fast pulse. ? Rash. ? Watery poop (diarrhea). ? Throwing up (vomiting). ? Dark pee. ? Serious headache. ? Dizziness. ? Stiff neck. ? Yellow color in your face or the white parts of your eyes (jaundice). Summary  After a blood transfusion, return to your normal activities as told by your doctor.  Every day, check for signs of infection where the IV tube was put into your vein.  Some  signs of infection are warm skin, more redness and pain, more fluid or blood, and pus or a bad smell where the needle went in.  Contact your doctor if you feel weak or have any unusual symptoms. This information is not intended to replace advice given to you by your health care provider. Make sure you discuss any questions you have with your health care provider. Document Released: 01/03/2015 Document Revised: 04/19/2018 Document Reviewed: 08/06/2016 Elsevier Patient Education  2020 [employer]  

## 2019-07-18 NOTE — Progress Notes (Signed)
[place] CSW Progress Notes  Request received from infusion room RN to meet w patient for "emergency need."  Spoke w patient in infusion - she received eviction notice and has court date 7/27 at 2 [address] [place] [address].  Owes significant amount of money to landlord, states she cannot pay this.  Has been working w [employer] to use housing voucher provided in order to relocate from present apartment which has mold.  Has been unable to locate apartment that will accept her available income and has current vacancies.  States she has talked w multiple complexes and has not found any that are able to rent to her so she has not paid the required application fees "I did not want to pay money to be told 'no'."  Voucher must be used by 7/31.  Pt agreeable to [place] of her current situation, email sent to [employer], caseworker.  RN CM working w family notified by VM Skipper Cliche) however she is out until 07/23/19.  Patient referred to [facility] via [facility] (Rachael Harvest Dark is caseworker).  Agency will screen and determine if they can assist.  CSW unable to refer to either of two family shelters in [place] as they either do not have available space or are not accepting applications at this time.  CSW will give these listings to patient and encourage her to contact them.    Also emailed Lessie Dings, [facility], to determine if this [facility] program can assist w temporary shelter if eviction is finalized.  Patient states that her fiance plans to pack up their possessions in [place] in case their unit is padlocked.  Also states that the children can live w family members in her hometown; however, this will disrupt any existing community support and medical care for them.  If patient plans to move in w family, she will be approx one hour away from [facility] and would plan to continue her care here.  Of note, police and EMS were called to patient's  home on Saturday as she was expressing suicidal ideation at that time. "I am just overwhelmed with all of this."  Was assessed and determined not to be danger to herself at that time, was not taken to ED for assessment.  Pt states that police officer "said he was going to personally refer me to a program that can help me."  Does not have officer's name or the name of the program.  Has not heard from them.  Denies current suicidal ideation or plan when assessed today.  CSW will await replies from various referrals made on client behalf.  Attempted to reach [name], fiance, to determine his wishes as his information needs to be on any applications.  Awaiting return call.  [name], LCSW Clinical Social Worker Phone:  [phone]

## 2019-07-18 NOTE — Progress Notes (Signed)
[place] CSW Progress Notes  Referral in progress to [place] for possible short term housing support for patient and family.  Referral made to [facility], awaiting reply.  Notified [employer] of patient need, awaiting reply.  Will follow up w patient tomorrow.  [name], LCSW Clinical Social Worker Phone:  [phone]

## 2019-07-19 ENCOUNTER — Encounter: Payer: Self-pay | Admitting: General Practice

## 2019-07-19 LAB — TYPE AND SCREEN
ABO/RH(D): A POS
Antibody Screen: NEGATIVE
Unit division: 0

## 2019-07-19 LAB — BPAM RBC
Blood Product Expiration Date: 202008162359
ISSUE DATE / TIME: 202007221202
Unit Type and Rh: 6200

## 2019-07-19 NOTE — Progress Notes (Signed)
[place] CSW Progress Notes  Call from [place] at [employer] [facility] - has received [place] screening but cannot proceed w any services until patient is literally homeless.  Will provide list of private landlords and income based housing in [place]. They will stand by to link to services if/when family becomes homeless Spoke w patient, she has been referred to Legal Aid by [employer], has been assigned to an attorney to represent her at the upcoming eviction proceedings.  Have discussed possibility of referral for community support services through [facility] [phone]) - patient will need to contact them to initiate assignment of a care coordinator who can assess patient and family needs and link w appropriate community support services.    [name], LCSW Clinical Social Worker Phone:  [phone]

## 2019-07-20 ENCOUNTER — Other Ambulatory Visit: Payer: Self-pay | Admitting: Physician Assistant

## 2019-07-20 ENCOUNTER — Other Ambulatory Visit: Payer: Self-pay

## 2019-07-20 ENCOUNTER — Encounter: Payer: Self-pay | Admitting: General Practice

## 2019-07-20 ENCOUNTER — Telehealth: Payer: Self-pay | Admitting: *Deleted

## 2019-07-20 ENCOUNTER — Inpatient Hospital Stay (HOSPITAL_BASED_OUTPATIENT_CLINIC_OR_DEPARTMENT_OTHER): Payer: Medicaid Other | Admitting: Physician Assistant

## 2019-07-20 ENCOUNTER — Encounter (HOSPITAL_COMMUNITY): Payer: Self-pay | Admitting: General Practice

## 2019-07-20 VITALS — BP 112/86 | HR 109 | Temp 98.0°F | Resp 18 | Ht 60.0 in | Wt 203.2 lb

## 2019-07-20 DIAGNOSIS — R21 Rash and other nonspecific skin eruption: Secondary | ICD-10-CM

## 2019-07-20 DIAGNOSIS — Z171 Estrogen receptor negative status [ER-]: Secondary | ICD-10-CM

## 2019-07-20 DIAGNOSIS — Z5111 Encounter for antineoplastic chemotherapy: Secondary | ICD-10-CM | POA: Diagnosis not present

## 2019-07-20 DIAGNOSIS — C50811 Malignant neoplasm of overlapping sites of right female breast: Secondary | ICD-10-CM

## 2019-07-20 DIAGNOSIS — L299 Pruritus, unspecified: Secondary | ICD-10-CM

## 2019-07-20 DIAGNOSIS — C773 Secondary and unspecified malignant neoplasm of axilla and upper limb lymph nodes: Secondary | ICD-10-CM

## 2019-07-20 MED ORDER — DEXAMETHASONE SODIUM PHOSPHATE 10 MG/ML IJ SOLN
10.0000 mg | Freq: Once | INTRAMUSCULAR | Status: AC
  Administered 2019-07-20: 10 mg via INTRAMUSCULAR

## 2019-07-20 MED ORDER — PREDNISONE 5 MG PO TABS: ORAL_TABLET | ORAL | 0 refills | Status: DC

## 2019-07-20 MED ORDER — DEXAMETHASONE SODIUM PHOSPHATE 10 MG/ML IJ SOLN
INTRAMUSCULAR | Status: AC
  Filled 2019-07-20: qty 2

## 2019-07-20 MED ORDER — DEXAMETHASONE SODIUM PHOSPHATE 10 MG/ML IJ SOLN: 20.0000 mg | Freq: Once | INTRAMUSCULAR | Status: DC

## 2019-07-20 NOTE — Progress Notes (Signed)
Pt reports itching wide-spread rash starting the morning after receiving blood transfusion on 7/22.  Pt has received blood before without any issues, pre-meds were given and no reaction occurred during transfusion.  Pt denies CP, SOB, trouble swallowing, fever/chills, or N/V/D or changes in urination.  No edema present.  Rash is small, dotted, intermittent, with unraised red dots on bilat arms, legs, back, stomach, and chest.  Underneath both breasts an itching rash that appears to be dry peeling skin without bleeding or drainage.  Pt denies allergies.  Pt also started oral abx this week.

## 2019-07-20 NOTE — Progress Notes (Signed)
[place] CSW Progress Notes  Complex Case Management Note   Does the patient have a legal guardian (if yes, please add name and contact information of guardian):  No.   Has guardianship paperwork been requested/received?: No.   Does the patient have an IDD diagnosis?: No.  Terri Estes (daughter - 9) does have I/DD diagnosis due to head injury.   Does the patient have a care coordinator?: No.   If there is no care coordinator, has a referral been made?:   Referral made to [facility] for I/DD care coordinator for [place] (10); Terri Estes had Terri Estes for care coordination until 2018, case closed because care coordinator could not reach family   [employer] (and contact information if applicable): Terri Estes, signficant other; cousin and parents in [place], intermittent support  Outside agencies involved:  [place]:  Terri Estes; housing voucher issued so family can relocate due to substandard current housing; must be used by 07/27/19 [facility]:  Dr Terri Estes Legal Aid:  For legal support during eviction proceedings [place]:  Assistance w filing for disability for Terri Estes Department:  RN Care Manager for [name] (2); [place]:  School social work and Social worker for [employer] (10); Terri Estes currently out of school and not receiving services due to [place] needs/complications (eg. Diabetic, C-PAP, O2, ADLs): Yes,  Terri Estes (10) has cerebral palsy and uses walker, Terri Estes (2) severe vision impairment; Terri Estes (10 months) unspecified disabilities; Terri Estes is on home oxygen therapy.  All children have multiple medical appointments, some of which have been missed.  RN CM working to get childrens appointments back on schedule.  Patient currently in chemotherapy for Stage IV breast cancer, will also need surgery and radiation treatments  Terri Estes is a 33 year old African American woman, diagnosed  w Stage IV breast cancer  04/25/2019.  She is currently in chemotherapy, experiencing multiple side effects of treatment.  Lives w significant other, Terri Estes, in substandard housing w 3 children (10, 2, 10 months) all of whom have developmental disabilities.  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  Terri Estes receives disability.  Patient and 71 year old son have applied for disability - son's application filed 03/972, patient's application filed 04/3298 as TERI case w help from [facility].    Terri Estes's main concern initially was finding suitable housing for her family.  States she lives in apartment w mold and other issues - this was confirmed by investigation by [employer].  She was offered a voucher to support relocation but has been unable to locate alternative housing that will accept her family given their limitations on income and Terri Estes's prior criminal history which is more than 31 years old.  This week, she received summons to appear in court for eviction proceedings and owes a significant amount of money to the current landlord.  She has been linked w a Legal Aid attorney for support during these proceedings.  Terri Estes continues to search for alternative housing and has been sent a list of options from [facility].  Terri Estes has also been referred to [facility]/ [place] for possible emergency housing in a hotel if the family is evicted.  Per Terri Estes, this cannot be guaranteed due to the family size and needs.  Terri Estes reports a suicidal crisis on Sat 7/18 where a family member summoned police and EMS to her home because they  were afraid Terri Estes would harm herself.  Per Terri Estes, she was not evaluated at an ED but the crisis was deescalated through help from a Engineer, structural and EMS.  She does report being overwhelmed, anxious and depressed due to the stress of cancer,  treatment, financial stress, impending housing instability, child care needs.    Terri Estes has a history of substance abuse treatment and is currently seen at [facility] by Dr Terri Estes for Subutex. Was hospitalized at [facility] for 65 days for substance abuse treatment approx 3 years ago, per record.  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   There are no other services available to her at this provider due to lack of therapists.  In the past, she was receiving substance abuse counseling and individual therapy for mental health concerns and found this helpful.  Throughout her cancer treatment, she has admitted to significant anxiety and depression.  Per chart review, she has a history of anxiety, depression and possibly bipolar disorder.  Currently on Paxil from [facility] providers for anxiety.  Reports difficulty sleeping, depressed mood, sense of being "overwhelmed", significant anxiety.    Patient agreeable to referral to outside mental health provider for more assistance.  Referral placed to [place] services( Phone: [phone] ) for evaluation for possible Community Support Team services to provide additional support for patient in the community.  [name], LCSW Clinical Social Worker Phone:  [place], [place] 07/20/2019 12:01 PM

## 2019-07-20 NOTE — Progress Notes (Signed)
[place] CSW Progress Notes  Patient has been on Section 8 Housing waiting list since 06/30/18 per [employer].  Appeal letter requesting expedited servicing faxed to Jarome Lamas, supervisor.  [name], LCSW Clinical Social Worker Phone:  [phone]

## 2019-07-20 NOTE — Patient Instructions (Signed)
Rash, Adult  A rash is a change in the color of your skin. A rash can also change the way your skin feels. There are many different conditions and factors that can cause a rash. Follow these instructions at home: The goal of treatment is to stop the itching and keep the rash from spreading. Watch for any changes in your symptoms. Let your doctor know about them. Follow these instructions to help with your condition: Medicine Take or apply over-the-counter and prescription medicines only as told by your doctor. These may include medicines:  To treat red or swollen skin (corticosteroid creams).  To treat itching.  To treat an allergy (oral antihistamines).  To treat very bad symptoms (oral corticosteroids).  Skin care  Put cool cloths (compresses) on the affected areas.  Do not scratch or rub your skin.  Avoid covering the rash. Make sure that the rash is exposed to air as much as possible. Managing itching and discomfort  Avoid hot showers or baths. These can make itching worse. A cold shower may help.  Try taking a bath with: ? Epsom salts. You can get these at your local pharmacy or grocery store. Follow the instructions on the package. ? Baking soda. Pour a small amount into the bath as told by your doctor. ? Colloidal oatmeal. You can get this at your local pharmacy or grocery store. Follow the instructions on the package.  Try putting baking soda paste onto your skin. Stir water into baking soda until it gets like a paste.  Try putting on a lotion that relieves itchiness (calamine lotion).  Keep cool and out of the sun. Sweating and being hot can make itching worse. General instructions   Rest as needed.  Drink enough fluid to keep your pee (urine) pale yellow.  Wear loose-fitting clothing.  Avoid scented soaps, detergents, and perfumes. Use gentle soaps, detergents, perfumes, and other cosmetic products.  Avoid anything that causes your rash. Keep a journal to  help track what causes your rash. Write down: ? What you eat. ? What cosmetic products you use. ? What you drink. ? What you wear. This includes jewelry.  Keep all follow-up visits as told by your doctor. This is important. Contact a doctor if:  You sweat at night.  You lose weight.  You pee (urinate) more than normal.  You pee less than normal, or you notice that your pee is a darker color than normal.  You feel weak.  You throw up (vomit).  Your skin or the whites of your eyes look yellow (jaundice).  Your skin: ? Tingles. ? Is numb.  Your rash: ? Does not go away after a few days. ? Gets worse.  You are: ? More thirsty than normal. ? More tired than normal.  You have: ? New symptoms. ? Pain in your belly (abdomen). ? A fever. ? Watery poop (diarrhea). Get help right away if:  You have a fever and your symptoms suddenly get worse.  You start to feel mixed up (confused).  You have a very bad headache or a stiff neck.  You have very bad joint pains or stiffness.  You have jerky movements that you cannot control (seizure).  Your rash covers all or most of your body. The rash may or may not be painful.  You have blisters that: ? Are on top of the rash. ? Grow larger. ? Grow together. ? Are painful. ? Are inside your nose or mouth.  You have a rash   that: ? Looks like purple pinprick-sized spots all over your body. ? Has a "bull's eye" or looks like a target. ? Is red and painful, causes your skin to peel, and is not from being in the sun too long. Summary  A rash is a change in the color of your skin. A rash can also change the way your skin feels.  The goal of treatment is to stop the itching and keep the rash from spreading.  Take or apply over-the-counter and prescription medicines only as told by your doctor.  Contact a doctor if you have new symptoms or symptoms that get worse.  Keep all follow-up visits as told by your doctor. This is  important. This information is not intended to replace advice given to you by your health care provider. Make sure you discuss any questions you have with your health care provider. Document Released: 05/31/2008 Document Revised: 04/06/2019 Document Reviewed: 07/17/2018 Elsevier Patient Education  2020 [employer]  

## 2019-07-20 NOTE — Progress Notes (Signed)
Symptoms Management Clinic Progress Note   Terri Estes [id number] March 26, 1986 33 y.o.  Terri Estes is managed by Dr. Jana Hakim  Actively treated with chemotherapy/immunotherapy/hormonal therapy: yes  Current therapy: Doxorubicin and Cyclophosphamide   Last treated: 7 / 7 / 2020  Next scheduled appointment with provider: 7 /29 / 2020  Assessment: Plan:    Plan: predniSONE (DELTASONE) 5 MG tablet, dexamethasone (DECADRON) injection 10 mg  Please see After Visit Summary for patient specific instructions.  The patient is a 33 year old African American female diagnosed with triple negative breast cancer. Her current treatment consists of Doxorubicin and Cyclophosphamide. Her last cycle was on 07/03/2019.   The patient presented today for a rash and pruritis. The patient will receive a one time dose of 10 mg of dexamethasone IM while in the clinic today. Additionally, she was prescribed a tapering dose of prednisone which she was instructed to begin tomorrow. The patient was also encouraged to use Zyrtec for her symptoms 10 mg once daily.   Future Appointments  Date Time Provider Alvin  07/25/2019  8:45 AM [facility] LAB 1 [facility] None  07/25/2019  9:00 AM [facility] [place] FLUSH [facility] None  07/25/2019  9:30 AM Causey, Charlestine Massed, NP [facility] None  07/25/2019 10:00 AM [facility] INFUSION [facility] None  07/26/2019 12:30 PM [facility] [place] 1 [facility]    No orders of the defined types were placed in this encounter.   Subjective:   Patient ID:  Terri Estes is a 33 y.o. (DOB [birth year]) female.  Chief Complaint:  Chief Complaint  Patient presents with  . Rash    HPI Terri Estes is a 33 year old female who is being treated for right sided triple negative breast cancer under the care of Dr. Jana Hakim.  The patient presented to the symptom management clinic today for evaluation for the chief complaint of a rash and pruritus. The  patient was recently seen in the clinic on 07/18/2019 for evaluation before her next cycle of chemotherapy.  At that time she was not treated with chemotherapy that day. Instead, she received 1 unit of blood for symptomatic anemia.  Additionally, she was given a prescription for amoxicillin for an ear infection. It is unclear when the patient began taking amoxicillin. The following morning on 07/19/2019, the patient woke up with diffuse itching all over her body as well as "small red bumps" scattered on various locations on her body. The patient endorses severe itching on her face, neck, back, chest, buttocks, groin, and extremities. The patient denies any known history of any allergies.  The patient has taken amoxicillin in the past without any adverse effects and has received blood transfusions in the past without any adverse side effects.  She received 4 units of blood while in the hospital delivering her daughter. She denies any new soaps, lotions, detergents, or medications.  She lives at home with her 3 children, none of which are experiencing similar symptoms. In order to  alleviate her symptoms, the patient is trying to use hydrocortisone cream the abdominal region without any success.  She denies any fevers, chills, shortness of breath, swelling/edema.  Difficulty swallowing, nausea, vomiting, or diarrhea.   Medications: I have reviewed the patient's current medications.  Allergies: No Known Allergies  Past Medical History:  Diagnosis Date  . Anemia    after pregnancy  . Anxiety    severe not taking meds  . Asthma    inhaler  4x per day  . Chronic pain   .  Family history of breast cancer   . History of substance abuse ([place])   . Pregnancy induced hypertension    takes medication now.  . Psoriasis   . Trichomonas infection   . Vaginal Pap smear, abnormal     Past Surgical History:  Procedure Laterality Date  . CESAREAN SECTION  05/26/2008  . CESAREAN SECTION N/A 07/10/2017    Procedure: CESAREAN SECTION;  Surgeon: Florian Buff, MD;  Location: [facility];  Service: Obstetrics;  Laterality: N/A;  . CESAREAN SECTION N/A 09/23/2018   Procedure: CESAREAN SECTION;  Surgeon: Donnamae Jude, MD;  Location: [facility];  Service: Obstetrics;  Laterality: N/A;  . COLPOSCOPY    . PORTACATH PLACEMENT Left 04/30/2019   Procedure: INSERTION PORT-A-CATH WITH ULTRASOUND;  Surgeon: Alphonsa Overall, MD;  Location: [facility];  Service: General;  Laterality: Left;  Left Subclavian vein  . WISDOM TOOTH EXTRACTION      Family History  Problem Relation Age of Onset  . Kidney disease Maternal Grandmother   . Breast cancer Maternal Grandmother 28       metastatic to liver; d. 52  . Kidney disease Maternal Grandfather   . Kidney disease Paternal Grandmother   . Diabetes Paternal Grandmother   . Breast cancer Paternal Grandmother   . Kidney disease Paternal Grandfather   . Breast cancer Paternal Aunt   . Cerebral palsy Child   . Breast cancer Paternal Great-grandmother   . Pancreatic cancer Other        PGMs brother   . Throat cancer Paternal Uncle   . Breast cancer Other        PGMs sister    Social History   Socioeconomic History  . Marital status: Significant Other    Spouse name: [name]   . Number of children: 3  . Years of education: Not on file  . Highest education level: Not on file  Occupational History  . Not on file  Social Needs  . Financial resource strain: Not on file  . Food insecurity    Worry: Not on file    Inability: Not on file  . Transportation needs    Medical: Not on file    Non-medical: Not on file  Tobacco Use  . Smoking status: Former Smoker    Packs/day: 0.50    Years: 5.00    Pack years: 2.50    Types: Cigarettes    Quit date: 04/10/2019    Years since quitting: 0.2  . Smokeless tobacco: Never Used  . Tobacco comment: trying to quit  Substance and Sexual Activity  . Alcohol use: No  . Drug use: Yes    Frequency: 7.0  times per week    Types: Marijuana    Comment: subutex  . Sexual activity: Yes    Birth control/protection: None    Comment: none  Lifestyle  . Physical activity    Days per week: Not on file    Minutes per session: Not on file  . Stress: Not on file  Relationships  . Social Herbalist on phone: Not on file    Gets together: Not on file    Attends religious service: Not on file    Active member of club or organization: Not on file    Attends meetings of clubs or organizations: Not on file    Relationship status: Not on file  . Intimate partner violence    Fear of current or ex partner: Not on file  Emotionally abused: Not on file    Physically abused: Not on file    Forced sexual activity: Not on file  Other Topics Concern  . Not on file  Social History Narrative  . Not on file    Past Medical History, Surgical history, Social history, and Family history were reviewed and updated as appropriate.   Please see review of systems for further details on the patient's review from today.   Review of Systems:  Review of Systems  Constitutional: Negative for fever.  HENT: Negative for facial swelling, mouth sores, sore throat and trouble swallowing.   Eyes: Negative for pain, discharge and itching.  Respiratory: Negative for cough, choking, chest tightness, shortness of breath, wheezing and stridor.   Cardiovascular: Negative for chest pain.  Gastrointestinal: Negative for abdominal pain, nausea and vomiting.  Skin: Positive for rash.  Neurological: Negative for dizziness, light-headedness and headaches.    Objective:   Physical Exam:  BP 112/86 (BP Location: Left Arm, Patient Position: Sitting)   Pulse (!) 109   Temp 98 F (36.7 C)   Resp 18   Ht 5' (1.524 m)   Wt 203 lb 3.2 oz (92.2 kg)   SpO2 100%   BMI 39.68 kg/m  ECOG: 1  Physical Exam Constitutional:      General: She is not in acute distress. HENT:     Head: Normocephalic and atraumatic.   Eyes:     Conjunctiva/sclera: Conjunctivae normal.     Pupils: Pupils are equal, round, and reactive to light.  Cardiovascular:     Rate and Rhythm: Normal rate and regular rhythm.  Pulmonary:     Effort: Pulmonary effort is normal.     Breath sounds: Normal breath sounds.  Musculoskeletal:        General: No swelling.  Skin:    General: Skin is warm and dry.     Findings: Rash (Occasional pinpoint and raised rash on her trunk, neck, and extremities. Diffuse scaling erythematous rash on her right breast.) present. No erythema.  Neurological:     Mental Status: She is alert.     Lab Review:     Component Value Date/Time   NA 144 07/18/2019 0902   NA 137 05/10/2018 1535   K 3.5 07/18/2019 0902   CL 109 07/18/2019 0902   CO2 22 07/18/2019 0902   GLUCOSE 124 (H) 07/18/2019 0902   BUN 11 07/18/2019 0902   BUN 7 05/10/2018 1535   CREATININE 1.05 (H) 07/18/2019 0902   CREATININE 0.81 07/03/2019 0919   CALCIUM 9.0 07/18/2019 0902   PROT 6.4 (L) 07/18/2019 0902   PROT 7.2 05/10/2018 1535   ALBUMIN 2.9 (L) 07/18/2019 0902   ALBUMIN 4.2 05/10/2018 1535   AST 14 (L) 07/18/2019 0902   AST 54 (H) 07/03/2019 0919   ALT 24 07/18/2019 0902   ALT 102 (H) 07/03/2019 0919   ALKPHOS 89 07/18/2019 0902   BILITOT 0.3 07/18/2019 0902   BILITOT <0.2 (L) 07/03/2019 0919   GFRNONAA >60 07/18/2019 0902   GFRNONAA >60 07/03/2019 0919   GFRAA >60 07/18/2019 0902   GFRAA >60 07/03/2019 0919       Component Value Date/Time   WBC 3.4 (L) 07/18/2019 0902   RBC 2.26 (L) 07/18/2019 0902   HGB 7.4 (L) 07/18/2019 0902   HGB 11.2 (L) 05/11/2019 1305   HGB 9.9 (L) 09/13/2018 1133   HCT 22.6 (L) 07/18/2019 0902   HCT 29.2 (L) 09/13/2018 1133   PLT 329 07/18/2019 0902  PLT 338 05/11/2019 1305   PLT 186 09/13/2018 1133   MCV 100.0 07/18/2019 0902   MCV 93 09/13/2018 1133   MCH 32.7 07/18/2019 0902   MCHC 32.7 07/18/2019 0902   RDW 17.7 (H) 07/18/2019 0902   RDW 13.4 09/13/2018 1133    LYMPHSABS 1.4 07/18/2019 0902   LYMPHSABS 1.4 05/10/2018 1535   MONOABS 0.3 07/18/2019 0902   EOSABS 0.0 07/18/2019 0902   EOSABS 0.0 05/10/2018 1535   BASOSABS 0.0 07/18/2019 0902   BASOSABS 0.0 05/10/2018 1535   -------------------------------  Imaging from last 24 hours (if applicable):  Radiology interpretation: No results found.

## 2019-07-20 NOTE — Telephone Encounter (Signed)
This RN retrieved VM from pt stating " Ever since I got that blood transfusion I have been itching like all over and I got tiny red bumps on me - I am itching so bad like I have the crabs " " I need someone to call me right away - oh lordy- it's bad "  Return call number given as [phone].  This RN returned call at [phone] upon retrieving from VM- no answer and left detailed message on identified VM for pt to call and request to speak to a nurse directly and not go in a VM- or to show up so this office can assess her rash and itching.  This RN also tried to contact pt's SO - [name] - with ringing then statement " cannot leave VM due to mailbox is full " Also tried other contact noted in chart- Belinda and received same message.  While documenting above- pt returned call. Discussed need for visit- pt will come in to be seen by [facility]. Appointment requested.  Of note pt states bodywide itching with rash of " little red bumps ", no noted increased breathing issues from her baseline.  Urgent LOS sent per above for [facility]

## 2019-07-21 ENCOUNTER — Ambulatory Visit: Payer: Medicaid Other

## 2019-07-23 ENCOUNTER — Encounter: Payer: Self-pay | Admitting: *Deleted

## 2019-07-23 NOTE — Progress Notes (Signed)
[place] [employer]  Holiday representative contacted patient at home to offer support and follow up regarding housing.  Patient stated her court date has been moved and she is waiting for her new court date.  Patient also stated she received the list of housing options where she could possibly use her housing voucher from the housing coalition.  CSW contacted Robbielene Lawhorne at [employer] to discuss possible extension of housing voucher.  CSW left a message waiting on return call.  Patient stated she would contact CSW with updates.   Johnnye Lana, MSW, LCSW, OSW-C Clinical Social Worker [facility] [phone]

## 2019-07-25 ENCOUNTER — Encounter: Payer: Self-pay | Admitting: Adult Health

## 2019-07-25 ENCOUNTER — Inpatient Hospital Stay (HOSPITAL_BASED_OUTPATIENT_CLINIC_OR_DEPARTMENT_OTHER): Payer: Medicaid Other | Admitting: Adult Health

## 2019-07-25 ENCOUNTER — Encounter: Payer: Self-pay | Admitting: Oncology

## 2019-07-25 ENCOUNTER — Other Ambulatory Visit: Payer: Self-pay

## 2019-07-25 ENCOUNTER — Encounter: Payer: Self-pay | Admitting: General Practice

## 2019-07-25 ENCOUNTER — Inpatient Hospital Stay: Payer: Medicaid Other

## 2019-07-25 VITALS — BP 112/84 | HR 87 | Temp 98.0°F | Resp 18 | Ht 60.0 in | Wt 201.9 lb

## 2019-07-25 DIAGNOSIS — H9201 Otalgia, right ear: Secondary | ICD-10-CM

## 2019-07-25 DIAGNOSIS — R21 Rash and other nonspecific skin eruption: Secondary | ICD-10-CM | POA: Diagnosis not present

## 2019-07-25 DIAGNOSIS — Z171 Estrogen receptor negative status [ER-]: Secondary | ICD-10-CM | POA: Diagnosis not present

## 2019-07-25 DIAGNOSIS — L299 Pruritus, unspecified: Secondary | ICD-10-CM

## 2019-07-25 DIAGNOSIS — B3781 Candidal esophagitis: Secondary | ICD-10-CM

## 2019-07-25 DIAGNOSIS — C50811 Malignant neoplasm of overlapping sites of right female breast: Secondary | ICD-10-CM | POA: Diagnosis not present

## 2019-07-25 DIAGNOSIS — G620 Drug-induced polyneuropathy: Secondary | ICD-10-CM

## 2019-07-25 DIAGNOSIS — C773 Secondary and unspecified malignant neoplasm of axilla and upper limb lymph nodes: Secondary | ICD-10-CM

## 2019-07-25 DIAGNOSIS — D649 Anemia, unspecified: Secondary | ICD-10-CM

## 2019-07-25 DIAGNOSIS — B370 Candidal stomatitis: Secondary | ICD-10-CM

## 2019-07-25 DIAGNOSIS — Z5111 Encounter for antineoplastic chemotherapy: Secondary | ICD-10-CM | POA: Diagnosis not present

## 2019-07-25 DIAGNOSIS — E860 Dehydration: Secondary | ICD-10-CM

## 2019-07-25 LAB — CBC WITH DIFFERENTIAL/PLATELET
Abs Immature Granulocytes: 0.23 10*3/uL — ABNORMAL HIGH (ref 0.00–0.07)
Basophils Absolute: 0 10*3/uL (ref 0.0–0.1)
Basophils Relative: 0 %
Eosinophils Absolute: 0 10*3/uL (ref 0.0–0.5)
Eosinophils Relative: 0 %
HCT: 29.6 % — ABNORMAL LOW (ref 36.0–46.0)
Hemoglobin: 9.6 g/dL — ABNORMAL LOW (ref 12.0–15.0)
Immature Granulocytes: 2 %
Lymphocytes Relative: 13 %
Lymphs Abs: 1.3 10*3/uL (ref 0.7–4.0)
MCH: 32 pg (ref 26.0–34.0)
MCHC: 32.4 g/dL (ref 30.0–36.0)
MCV: 98.7 fL (ref 80.0–100.0)
Monocytes Absolute: 0.8 10*3/uL (ref 0.1–1.0)
Monocytes Relative: 8 %
Neutro Abs: 7.7 10*3/uL (ref 1.7–7.7)
Neutrophils Relative %: 77 %
Platelets: 422 10*3/uL — ABNORMAL HIGH (ref 150–400)
RBC: 3 MIL/uL — ABNORMAL LOW (ref 3.87–5.11)
RDW: 17.7 % — ABNORMAL HIGH (ref 11.5–15.5)
WBC: 10 10*3/uL (ref 4.0–10.5)
nRBC: 3.1 % — ABNORMAL HIGH (ref 0.0–0.2)

## 2019-07-25 LAB — COMPREHENSIVE METABOLIC PANEL
ALT: 16 U/L (ref 0–44)
AST: 13 U/L — ABNORMAL LOW (ref 15–41)
Albumin: 3.2 g/dL — ABNORMAL LOW (ref 3.5–5.0)
Alkaline Phosphatase: 69 U/L (ref 38–126)
Anion gap: 10 (ref 5–15)
BUN: 20 mg/dL (ref 6–20)
CO2: 28 mmol/L (ref 22–32)
Calcium: 9.2 mg/dL (ref 8.9–10.3)
Chloride: 104 mmol/L (ref 98–111)
Creatinine, Ser: 0.93 mg/dL (ref 0.44–1.00)
GFR calc Af Amer: >60 mL/min (ref >60)
GFR calc non Af Amer: >60 mL/min (ref >60)
Glucose, Bld: 101 mg/dL — ABNORMAL HIGH (ref 70–99)
Potassium: 3.5 mmol/L (ref 3.5–5.1)
Sodium: 142 mmol/L (ref 135–145)
Total Bilirubin: 0.4 mg/dL (ref 0.3–1.2)
Total Protein: 6.5 g/dL (ref 6.5–8.1)

## 2019-07-25 MED ORDER — HEPARIN SOD (PORK) LOCK FLUSH 100 UNIT/ML IV SOLN
500.0000 [IU] | Freq: Once | INTRAVENOUS | Status: AC | PRN
  Administered 2019-07-25: 500 [IU]
  Filled 2019-07-25: qty 5

## 2019-07-25 MED ORDER — DOXORUBICIN HCL CHEMO IV INJECTION 2 MG/ML
60.0000 mg/m2 | Freq: Once | INTRAVENOUS | Status: AC
  Administered 2019-07-25: 120 mg via INTRAVENOUS
  Filled 2019-07-25: qty 60

## 2019-07-25 MED ORDER — SODIUM CHLORIDE 0.9 % IV SOLN
INTRAVENOUS | Status: DC
  Administered 2019-07-25: 11:00:00 via INTRAVENOUS
  Filled 2019-07-25: qty 250

## 2019-07-25 MED ORDER — PEGFILGRASTIM 6 MG/0.6ML ~~LOC~~ PSKT
PREFILLED_SYRINGE | SUBCUTANEOUS | Status: AC
  Filled 2019-07-25: qty 0.6

## 2019-07-25 MED ORDER — SODIUM CHLORIDE 0.9 % IV SOLN
Freq: Once | INTRAVENOUS | Status: AC
  Administered 2019-07-25: 11:00:00 via INTRAVENOUS
  Filled 2019-07-25: qty 5

## 2019-07-25 MED ORDER — PALONOSETRON HCL INJECTION 0.25 MG/5ML
INTRAVENOUS | Status: AC
  Filled 2019-07-25: qty 5

## 2019-07-25 MED ORDER — SODIUM CHLORIDE 0.9 % IV SOLN
600.0000 mg/m2 | Freq: Once | INTRAVENOUS | Status: AC
  Administered 2019-07-25: 1200 mg via INTRAVENOUS
  Filled 2019-07-25: qty 60

## 2019-07-25 MED ORDER — SODIUM CHLORIDE 0.9 % IV SOLN
Freq: Once | INTRAVENOUS | Status: AC
  Administered 2019-07-25: 11:00:00 via INTRAVENOUS
  Filled 2019-07-25: qty 250

## 2019-07-25 MED ORDER — PALONOSETRON HCL INJECTION 0.25 MG/5ML
0.2500 mg | Freq: Once | INTRAVENOUS | Status: AC
  Administered 2019-07-25: 0.25 mg via INTRAVENOUS

## 2019-07-25 MED ORDER — PEGFILGRASTIM 6 MG/0.6ML ~~LOC~~ PSKT
6.0000 mg | PREFILLED_SYRINGE | Freq: Once | SUBCUTANEOUS | Status: AC
  Administered 2019-07-25: 6 mg via SUBCUTANEOUS

## 2019-07-25 MED ORDER — FLUCONAZOLE 200 MG PO TABS: 200.0000 mg | ORAL_TABLET | Freq: Every day | ORAL | 0 refills | Status: DC

## 2019-07-25 MED ORDER — SODIUM CHLORIDE 0.9% FLUSH
10.0000 mL | INTRAVENOUS | Status: DC | PRN
  Administered 2019-07-25: 10 mL
  Filled 2019-07-25: qty 10

## 2019-07-25 NOTE — Patient Instructions (Signed)
Tunneled Central Venous Catheter Flushing Guide  It is important to flush your tunneled central venous catheter each time you use it, both before and after you use it. Flushing your catheter will help prevent it from clogging. What are the risks? Risks may include:  Infection.  Air getting into the catheter and bloodstream. Supplies needed:  A clean pair of gloves.  A disinfecting wipe. Use an alcohol wipe, chlorhexidine wipe, or iodine wipe as told by your health care provider.  A 10 mL syringe that has been prefilled with saline solution.  An empty 10 mL syringe, if a substance called heparin was injected into your catheter. How to flush your catheter When you flush your catheter, make sure you follow any specific instructions from your health care provider or the manufacturer. These are general guidelines. Flushing your catheter before use If there is heparin in your catheter: 1. Wash your hands with soap and water. 2. Put on gloves. 3. Scrub the injection cap for a minimum of 15 seconds with a disinfecting wipe. 4. Unclamp the catheter. 5. Attach the empty syringe to the injection cap. 6. Pull the syringe plunger back and withdraw 10 mL of blood. 7. Place the syringe into an appropriate waste container. 8. Scrub the injection cap for 15 seconds with a disinfecting wipe. 9. Attach the prefilled syringe to the injection cap. 10. Flush the catheter by pushing the plunger forward until all the liquid from the syringe is in the catheter. 11. Remove the syringe from the injection cap. 12. Clamp the catheter. If there is no heparin in your catheter: 1. Wash your hands with soap and water. 2. Put on gloves. 3. Scrub the injection cap for 15 seconds with a disinfecting wipe. 4. Unclamp the catheter. 5. Attach the prefilled syringe to the injection cap. 6. Flush the catheter by pushing the plunger forward until 5 mL of the liquid from the syringe is in the catheter. 7. Pull back on  the syringe until you see blood in the catheter. 8. If you have been asked to collect any blood, follow your health care provider's instructions. Otherwise, flush the catheter with the rest of the solution from the syringe. 9. Remove the syringe from the injection cap. 10. Clamp the catheter.  Flushing your catheter after use 1. Wash your hands with soap and water. 2. Put on gloves. 3. Scrub the injection cap for 15 seconds with a disinfecting wipe. 4. Unclamp the catheter. 5. Attach the prefilled syringe to the injection cap. 6. Flush the catheter by pushing the plunger forward until all of the liquid from the syringe is in the catheter. 7. Remove the syringe from the injection cap. 8. Clamp the catheter. Problems and solutions  If blood cannot be completely cleared from the injection cap, you may need to have the injection cap replaced.  If the catheter is difficult to flush, use the pulsing method. The pulsing method involves pushing only a few milliliters of solution into the catheter at a time and pausing between pushes.  If you do not see blood in the catheter when you pull back on the syringe, change your body position, such as by raising your arms above your head. Take a deep breath and cough. Then, pull back on the syringe. If you still do not see blood, flush the catheter with a small amount of solution. Then, change positions again and take a breath or cough. Pull back on the syringe again. If you still do not see   blood, finish flushing the catheter and contact your health care provider. Do not use your catheter until your health care provider says it is okay. General tips  Have someone help you flush your catheter, if possible.  Do not force fluid through your catheter.  Do not use a syringe that is larger or smaller than 10 mL. Using a smaller syringe can make the catheter burst.  Do not use your catheter without flushing it first if it has heparin in it. Contact a health  care provider if:  You cannot see any blood in the catheter when you flush it before using it.  Your catheter is difficult to flush. Get help right away if:  You cannot flush the catheter.  The catheter leaks when you flush it or when there is fluid in it.  There are cracks or breaks in the catheter. Summary  It is important to flush your tunneled central venous catheter each time you use it, both before and after you use it.  Scrub the injection cap for 15 seconds with a disinfecting wipe before and after you flush it.  When you flush your catheter, make sure you follow any specific instructions from your health care provider or the manufacturer.  Get help right away if you cannot flush the catheter. This information is not intended to replace advice given to you by your health care provider. Make sure you discuss any questions you have with your health care provider. Document Released: 12/02/2011 Document Revised: 02/28/2019 Document Reviewed: 02/28/2019 Elsevier Patient Education  2020 Elsevier Inc.  

## 2019-07-25 NOTE — Progress Notes (Signed)
[place] CSW Progress Notes  Call from [place] [phone]).  Recapped progress - eviction date in court postponed, Housing Coalition is extending voucher availability and hopes patient can locate alternative housing before eviction occurs.  Per [employer], they cannot help her w rent as the current unit has been deemed substandard by their housing Agricultural consultant.  Also verified that referral has been received by [facility] for community support, they have reached out to patient but she has not returned called.  Left message w Octavia Bruckner RN w numbers for patient to call this agency [facility] [phone] or Lavella Lemons [phone]).  Agency can help her w coping skills and mental health symptom management in the community however patient must return calls and engage with agency.  [name], LCSW Clinical Social Worker Phone:  [phone]

## 2019-07-25 NOTE — Patient Instructions (Signed)
You may remove Onpro @ 6pm on Thursday, 07/26/2019.  Contact the [employer].  Crystal Herbin [phone] or Tonya [phone].  [facility] Discharge Instructions for Patients Receiving Chemotherapy  Today you received the following chemotherapy agents:  Adriamycin and Cytoxan.  To help prevent nausea and vomiting after your treatment, we encourage you to take your nausea medication as directed.   If you develop nausea and vomiting that is not controlled by your nausea medication, call the clinic.   BELOW ARE SYMPTOMS THAT SHOULD BE REPORTED IMMEDIATELY:  *FEVER GREATER THAN 100.5 F  *CHILLS WITH OR WITHOUT FEVER  NAUSEA AND VOMITING THAT IS NOT CONTROLLED WITH YOUR NAUSEA MEDICATION  *UNUSUAL SHORTNESS OF BREATH  *UNUSUAL BRUISING OR BLEEDING  TENDERNESS IN MOUTH AND THROAT WITH OR WITHOUT PRESENCE OF ULCERS  *URINARY PROBLEMS  *BOWEL PROBLEMS  UNUSUAL RASH Items with * indicate a potential emergency and should be followed up as soon as possible.  Feel free to call the clinic should you have any questions or concerns. The clinic phone number is [phone].  Please show the [place] at check-in to the Emergency Department and triage nurse.

## 2019-07-25 NOTE — Progress Notes (Signed)
[facility]  [phone] [phone]    ID: Terri Estes DOB: 01-15-1986  MR#: [id number]  RFF#:[id number]  Patient Care Team: Patient, No Pcp Per as PCP - General (General Practice) Alphonsa Overall, MD as Consulting Physician (General Surgery) Magrinat, [name], MD as Consulting Physician (Oncology) Kyung Rudd, MD as Consulting Physician (Radiation Oncology) Rockwell Germany, RN as Oncology Nurse Navigator Mauro Kaufmann, RN as Oncology Nurse Navigator Donnamae Jude, MD as Consulting Physician (Obstetrics and Gynecology) Pavelock, Ralene Bathe, MD as Consulting Physician (Internal Medicine) OTHER MD: Joylene Igo for [facility] is [phone]   CHIEF COMPLAINT: Triple negative breast cancer  CURRENT TREATMENT: Neoadjuvant chemotherapy[/immunotherapy]/goserelin   INTERVAL HISTORY: Terri Estes returns today for follow-up and treatment of her estrogen negative breast cancer and is undergoing neoadjuvant chemotherapy.  She stopped receiving Paclitaxel and Carboplatin after 5 cycles due to peripheral neuropathy.  Now, she is receiving Doxorubicin and Cyclophspahmide given on day 1 of a 14 day cycle with onpro support.  Today is cycle 2 day 1.    REVIEW OF SYSTEMS: Terri Estes was anemic last week and received blood instead of her treatment with Doxorubicin and Cyclophosphamide.  She tolerated it well and feels better.  She also was started on Amoxicillin for right otitis media.  She notes her ear pain is improving.  She says she is less tired and eating and drinking more.  She denies a sore throat.    Terri Estes developed a skin rash and itching the day after her blood transfusion.  She was evaluated in symptom management clinic and was given steroids and a steroid taper.  The rash has resolved but the itching has persisted.  She denies any fever or chills.  She has no nausea, vomiting, diarrhea.    Terri Estes notes that her fiance has bipolar disorder, and she  has recently stopped receiving his calls because he isn't taking his medications.  However, her aunt has came down to stay with her while she is receiving treatment to help with her home and disabled children.  This has been tremendous support for her.  Terri Estes has been reaching out to different ministries about being evicted from her home, to see if she can get some assistance so she can stay there.  She is working with our Education officer, museum, [name] about this.    Otherwise, a detailed ROS was non contributory.    HISTORY OF CURRENT ILLNESS: From the original intake note:  [name] presented with swelling along the left side of the face with neck pain. She then underwent a neck CT on 04/11/2019 showing: Enlarged left level 2 lymph nodes with associated inflammatory change including stranding of the adjacent fat and thickening of the platysma. While malignancy is not excluded, this is most concerning for infection. No focal abscess is present. Hypoattenuation within 1 of the left level 2 lymph nodes likely represents central necrosis or suppuration of the node. No primary malignancy or other abscess. Left upper lobe pulmonary nodules may be inflammatory. The largest measures 0.7 x 0.7 x 0.6 cm. CT of the chest with contrast may be useful for further evaluation.  She also underwent a chest CT on the same day showing: Complex right breast mass measuring 5.6 x 4.5 x 4.7 cm consistent with a primary breast carcinoma. Malignant right axillary adenopathy measuring up to 3.1 cm. Multiple bilateral pulmonary nodules are concerning for metastatic disease. Given the right breast mass, the left cervical lymph nodes are more likely malignant.  She then underwent bilateral diagnostic mammography with tomography and right breast ultrasonography at [facility] on 04/19/2019 showing: Breast Density Category C; findings which are highly suspicious for multicentric inflammatory right breast cancer with  right axillary nodal metastatic disease; no mammographic evidence of malignancy involving the left breast.  Accordingly on 04/19/2019 she proceeded to biopsy of the right breast area in question. The pathology from this procedure showed ([id number]): invasive ductal carcinoma, grade III, upper inner quadrant, 12:30 o'clock, 5.0 cm from the nipple. Prognostic indicators significant for: estrogen receptor, 0% negative and progesterone receptor, 0% negative. Proliferation marker Ki67 at 90%. HER2 negative (0+).  Additional biopsies of the right breast and right axilla were performed on the same day. The pathology from this procedure showed ([id number]): 2. Breast, right, needle core biopsy, satellite mass UOQ, 10 o'clock, 8cmfn  - Invasive ductal carcinoma, grade III 3. Lymph node, needle/core biopsy, level 1 right axilla   - Invasive ductal carcinoma, grade III  The patient's subsequent history is as detailed below.   PAST MEDICAL HISTORY: Past Medical History:  Diagnosis Date   Anemia    after pregnancy   Anxiety    severe not taking meds   Asthma    inhaler  4x per day   Chronic pain    Family history of breast cancer    History of substance abuse ([place])    Pregnancy induced hypertension    takes medication now.   Psoriasis    Trichomonas infection    Vaginal Pap smear, abnormal      PAST SURGICAL HISTORY: Past Surgical History:  Procedure Laterality Date   CESAREAN SECTION  05/26/2008   CESAREAN SECTION N/A 07/10/2017   Procedure: CESAREAN SECTION;  Surgeon: Florian Buff, MD;  Location: [facility];  Service: Obstetrics;  Laterality: N/A;   CESAREAN SECTION N/A 09/23/2018   Procedure: CESAREAN SECTION;  Surgeon: Donnamae Jude, MD;  Location: [facility];  Service: Obstetrics;  Laterality: N/A;   COLPOSCOPY     PORTACATH PLACEMENT Left 04/30/2019   Procedure: INSERTION PORT-A-CATH WITH ULTRASOUND;  Surgeon: Alphonsa Overall, MD;  Location: [facility];   Service: General;  Laterality: Left;  Left Subclavian vein   WISDOM TOOTH EXTRACTION       FAMILY HISTORY: Family History  Problem Relation Age of Onset   Kidney disease Maternal Grandmother    Breast cancer Maternal Grandmother 28       metastatic to liver; d. 32   Kidney disease Maternal Grandfather    Kidney disease Paternal Grandmother    Diabetes Paternal Grandmother    Breast cancer Paternal Grandmother    Kidney disease Paternal Grandfather    Breast cancer Paternal Aunt    Cerebral palsy Child    Breast cancer Paternal Great-grandmother    Pancreatic cancer Other        PGMs brother    Throat cancer Paternal Uncle    Breast cancer Other        PGMs sister  (Updated 04/25/2019) Thomasina's father is living at age 3. Patients' mother is also living at age 50. [place] notes, however, that she was raised by her grandmother.) The patient has 0 brothers and 5 sisters. Patient denies anyone in her family having ovarian, prostate, or pancreatic cancer. Her maternal grandmother was diagnosed with breast cancer, unsure what age. On her father's side, she reports her grandmother had cysts in her breasts, her great-aunt might have had breast cancer, and her great-grandmother was diagnosed with breast  cancer at an older age.   GYNECOLOGIC HISTORY:  No LMP recorded. (Menstrual status: Chemotherapy).  She had an emergent C-section in 09/23/2018 for abruptio placenta at 30 weeks pregnancy, also has a history of eclampsia Menarche:    years old Age at first live birth: 33 years old [place] P: 3 LMP: 04/18/2019 Contraceptive: none HRT: none  Hysterectomy?: no BSO?: no   SOCIAL HISTORY: (Current as of 04/25/2019) Terri Estes is currently unemployed. She previously worked at [employer]. She is currently engaged. Fiance [name] runs a Midwife and works other odd jobs. Daughter Demetrius Charity, age 41, was abused by her father at 41 months and has cerebral palsy. Son  [name], age 32, has severe asthma and is developmentally disabled. Daughter Yetta Glassman was born on 09/23/2018 prematurely and remains on oxygen at home.    ADVANCED DIRECTIVES: not in place.    HEALTH MAINTENANCE: Social History   Tobacco Use   Smoking status: Former Smoker    Packs/day: 0.50    Years: 5.00    Pack years: 2.50    Types: Cigarettes    Quit date: 04/10/2019    Years since quitting: 0.2   Smokeless tobacco: Never Used   Tobacco comment: trying to quit  Substance Use Topics   Alcohol use: No   Drug use: Yes    Frequency: 7.0 times per week    Types: Marijuana    Comment: subutex    Colonoscopy: never done  PAP: 05/10/2018  Bone density: never done Mammography: first performed following abnormal CT  No Known Allergies  Current Outpatient Medications  Medication Sig Dispense Refill   acyclovir ointment (ZOVIRAX) 5 % Apply 1 application topically 2 (two) times daily as needed. Mix 1 part acyclovir cream with 1 part over the counter hydrocortisone 1% cream and apply to affected area two times a day as needed 5 g 0   albuterol (PROVENTIL HFA;VENTOLIN HFA) 108 (90 Base) MCG/ACT inhaler Inhale 2 puffs into the lungs 4 (four) times daily. 1 Inhaler 2   amoxicillin (AMOXIL) 500 MG tablet Take 1 tablet (500 mg total) by mouth 2 (two) times daily. 14 tablet 0   buprenorphine (SUBUTEX) 8 MG SUBL SL tablet Place 8 mg under the tongue 2 (two) times daily.      cholestyramine (QUESTRAN) 4 g packet Take 1 packet (4 g total) by mouth 3 (three) times daily with meals. 60 each 12   cyclobenzaprine (FLEXERIL) 10 MG tablet Take 1 tablet (10 mg total) by mouth 3 (three) times daily as needed for muscle spasms. 90 tablet 2   dexamethasone (DECADRON) 4 MG tablet TAKE 2 TABLETS BY MOUTH DAILY ON THE DAY AFTER CHEMOTHERAPY, AND THEN TAKE 2 TABLETS TWICE DAILY FOR 2 DAYS AFTERWARDS. TAKE WITH FOOD 30 tablet 1   enalapril (VASOTEC) 10 MG tablet TAKE 1 TABLET BY MOUTH DAILY 90 tablet 0    fluconazole (DIFLUCAN) 200 MG tablet Take 1 tablet (200 mg total) by mouth daily. 30 tablet 0   gabapentin (NEURONTIN) 300 MG capsule Take 1 capsule (300 mg total) by mouth 3 (three) times daily. 90 capsule 0   hydrocortisone (ANUSOL-HC) 2.5 % rectal cream Place 1 application rectally 2 (two) times daily. 60 g 0   omeprazole (PRILOSEC) 40 MG capsule Take 1 capsule (40 mg total) by mouth daily. 30 capsule 5   ondansetron (ZOFRAN) 8 MG tablet TAKE 1 TABLET(8 MG) BY MOUTH EVERY 8 HOURS AS NEEDED FOR NAUSEA OR VOMITING 20 tablet 0   PARoxetine (  PAXIL) 10 MG tablet Take 1 tablet (10 mg total) by mouth daily. (Patient taking differently: Take 10 mg by mouth daily. Patient is taking 2 tablets daily per MD) 30 tablet 5   predniSONE (DELTASONE) 5 MG tablet 6 tab x 1 day, 5 tab x 1 day, 4 tab x 1 day, 3 tab x 1 day, 2 tab x 1 day, 1 tab x 1 day, stop 21 tablet 0   Prenatal MV-Min-FA-Omega-3 (PRENATAL GUMMIES/DHA & FA) 0.4-32.5 MG CHEW Chew 2 each by mouth daily.     prochlorperazine (COMPAZINE) 10 MG tablet TAKE 1 TABLET(10 MG) BY MOUTH EVERY 6 HOURS AS NEEDED FOR NAUSEA OR VOMITING 30 tablet 1   valACYclovir (VALTREX) 1000 MG tablet Take 1 tablet (1,000 mg total) by mouth 2 (two) times daily. 60 tablet 0   No current facility-administered medications for this visit.    Facility-Administered Medications Ordered in Other Visits  Medication Dose Route Frequency Provider Last Rate Last Dose   0.9 %  sodium chloride infusion   Intravenous Continuous Magrinat, [name], MD   Stopped at 07/25/19 1259   0.9 % NaCl with KCl 20 mEq/ L  infusion   Intravenous Once Magrinat, [name], MD       sodium chloride flush (NS) 0.9 % injection 10 mL  10 mL Intracatheter PRN Magrinat, [name], MD       sodium chloride flush (NS) 0.9 % injection 10 mL  10 mL Intracatheter PRN Gardenia Phlegm, NP   10 mL at 07/25/19 1310     OBJECTIVE:   Vitals:   07/25/19 0948  BP: 112/84  Pulse: 87  Resp: 18   Temp: 98 F (36.7 C)  SpO2: 100%     Body mass index is 39.43 kg/m.   Wt Readings from Last 3 Encounters:  07/25/19 201 lb 14.4 oz (91.6 kg)  07/20/19 203 lb 3.2 oz (92.2 kg)  07/18/19 200 lb (90.7 kg)  ECOG FS:1 - Symptomatic but completely ambulatory GENERAL: Young woman in no apparent distress.  Appears slightly tired. HEENT:  Sclerae anicteric. Neck is supple NODES:  No cervical, supraclavicular, or axillary lymphadenopathy palpated.  BREAST EXAM:  Right breast with continue skin changes, but improved form prior, smaller than prior LUNGS:  Clear to auscultation bilaterally.  No wheezes or rhonchi. HEART:  Regular rate and regular rhythm. No murmur appreciated. ABDOMEN:  Soft, nontender.  Positive, normoactive bowel sounds. No organomegaly palpated. MSK:  No focal spinal tenderness to palpation.  EXTREMITIES:  No peripheral edema.  Strength and gait is intact SKIN:  Clear with no obvious rashes or skin changes. No nail dyscrasia. NEURO:  Nonfocal. Well oriented.  Appropriate affect.    LAB RESULTS:  CMP     Component Value Date/Time   NA 142 07/25/2019 0926   NA 137 05/10/2018 1535   K 3.5 07/25/2019 0926   CL 104 07/25/2019 0926   CO2 28 07/25/2019 0926   GLUCOSE 101 (H) 07/25/2019 0926   BUN 20 07/25/2019 0926   BUN 7 05/10/2018 1535   CREATININE 0.93 07/25/2019 0926   CREATININE 0.81 07/03/2019 0919   CALCIUM 9.2 07/25/2019 0926   PROT 6.5 07/25/2019 0926   PROT 7.2 05/10/2018 1535   ALBUMIN 3.2 (L) 07/25/2019 0926   ALBUMIN 4.2 05/10/2018 1535   AST 13 (L) 07/25/2019 0926   AST 54 (H) 07/03/2019 0919   ALT 16 07/25/2019 0926   ALT 102 (H) 07/03/2019 0919   ALKPHOS 69 07/25/2019  0926   BILITOT 0.4 07/25/2019 0926   BILITOT <0.2 (L) 07/03/2019 0919   GFRNONAA >60 07/25/2019 0926   GFRNONAA >60 07/03/2019 0919   GFRAA >60 07/25/2019 0926   GFRAA >60 07/03/2019 0919    No results found for: TOTALPROTELP, ALBUMINELP, A1GS, A2GS, BETS, BETA2SER, GAMS,  MSPIKE, SPEI  No results found for: KPAFRELGTCHN, LAMBDASER, [facility]  Lab Results  Component Value Date   WBC 10.0 07/25/2019   NEUTROABS 7.7 07/25/2019   HGB 9.6 (L) 07/25/2019   HCT 29.6 (L) 07/25/2019   MCV 98.7 07/25/2019   PLT 422 (H) 07/25/2019    '@LASTCHEMISTRY' @  No results found for: LABCA2  No components found for: [id number]  No results for input(s): INR in the last 168 hours.  No results found for: LABCA2  No results found for: RFV436  No results found for: GOV703  No results found for: EKB524  No results found for: CA2729  No components found for: HGQUANT  No results found for: CEA1 / No results found for: CEA1   No results found for: AFPTUMOR  No results found for: CHROMOGRNA  No results found for: PSA1  Appointment on 07/25/2019  Component Date Value Ref Range Status   Sodium 07/25/2019 142  135 - 145 mmol/L Final   Potassium 07/25/2019 3.5  3.5 - 5.1 mmol/L Final   Chloride 07/25/2019 104  98 - 111 mmol/L Final   CO2 07/25/2019 28  22 - 32 mmol/L Final   Glucose, Bld 07/25/2019 101* 70 - 99 mg/dL Final   BUN 07/25/2019 20  6 - 20 mg/dL Final   Creatinine, Ser 07/25/2019 0.93  0.44 - 1.00 mg/dL Final   Calcium 07/25/2019 9.2  8.9 - 10.3 mg/dL Final   Total Protein 07/25/2019 6.5  6.5 - 8.1 g/dL Final   Albumin 07/25/2019 3.2* 3.5 - 5.0 g/dL Final   AST 07/25/2019 13* 15 - 41 U/L Final   ALT 07/25/2019 16  0 - 44 U/L Final   Alkaline Phosphatase 07/25/2019 69  38 - 126 U/L Final   Total Bilirubin 07/25/2019 0.4  0.3 - 1.2 mg/dL Final   GFR calc non Af Amer 07/25/2019 >60  >60 mL/min Final   GFR calc Af Amer 07/25/2019 >60  >60 mL/min Final   Anion gap 07/25/2019 10  5 - 15 Final   Performed at [facility], [place] [address] [place], [place] [address]   WBC 07/25/2019 10.0  4.0 - 10.5 K/uL Final   RBC 07/25/2019 3.00* 3.87 - 5.11 MIL/uL Final   Hemoglobin 07/25/2019 9.6* 12.0 - 15.0 g/dL Final     HCT 07/25/2019 29.6* 36.0 - 46.0 % Final   MCV 07/25/2019 98.7  80.0 - 100.0 fL Final   MCH 07/25/2019 32.0  26.0 - 34.0 pg Final   MCHC 07/25/2019 32.4  30.0 - 36.0 g/dL Final   RDW 07/25/2019 17.7* 11.5 - 15.5 % Final   Platelets 07/25/2019 422* 150 - 400 K/uL Final   nRBC 07/25/2019 3.1* 0.0 - 0.2 % Final   Neutrophils Relative % 07/25/2019 77  % Final   Neutro Abs 07/25/2019 7.7  1.7 - 7.7 K/uL Final   Lymphocytes Relative 07/25/2019 13  % Final   Lymphs Abs 07/25/2019 1.3  0.7 - 4.0 K/uL Final   Monocytes Relative 07/25/2019 8  % Final   Monocytes Absolute 07/25/2019 0.8  0.1 - 1.0 K/uL Final   Eosinophils Relative 07/25/2019 0  % Final   Eosinophils Absolute 07/25/2019  0.0  0.0 - 0.5 K/uL Final   Basophils Relative 07/25/2019 0  % Final   Basophils Absolute 07/25/2019 0.0  0.0 - 0.1 K/uL Final   Immature Granulocytes 07/25/2019 2  % Final   Abs Immature Granulocytes 07/25/2019 0.23* 0.00 - 0.07 K/uL Final   Performed at [facility], [place] [place] [address] [place], [place] [address]    (this displays the last labs from the last 3 days)  No results found for: TOTALPROTELP, ALBUMINELP, A1GS, A2GS, BETS, BETA2SER, GAMS, MSPIKE, SPEI (this displays SPEP labs)  No results found for: KPAFRELGTCHN, LAMBDASER, KAPLAMBRATIO (kappa/lambda light chains)  Lab Results  Component Value Date   HGBA 97.4 12/16/2016   (Hemoglobinopathy evaluation)   No results found for: LDH  No results found for: IRON, TIBC, IRONPCTSAT (Iron and TIBC)  No results found for: FERRITIN  Urinalysis    Component Value Date/Time   COLORURINE YELLOW 06/26/2019 1300   APPEARANCEUR HAZY (A) 06/26/2019 1300   LABSPEC 1.019 06/26/2019 1300   PHURINE 6.0 06/26/2019 1300   GLUCOSEU NEGATIVE 06/26/2019 1300   HGBUR NEGATIVE 06/26/2019 1300   BILIRUBINUR NEGATIVE 06/26/2019 1300   KETONESUR NEGATIVE 06/26/2019 1300   PROTEINUR NEGATIVE 06/26/2019 1300    UROBILINOGEN 4.0 (H) 01/13/2018 1417   NITRITE NEGATIVE 06/26/2019 1300   LEUKOCYTESUR LARGE (A) 06/26/2019 1300     STUDIES:  No results found.   ELIGIBLE FOR AVAILABLE RESEARCH PROTOCOL: no   ASSESSMENT: 33 y.o. [place] woman status post right breast biopsy x2 and right axillary lymph node biopsy 04/19/2019 for a clinical T4 N2 MX, stage IIIC invasive ductal carcinoma, grade 3, triple negative, with an MIB-1 of 90%  (a) chest CT scan 04/11/2019 shows multiple subcentimeter lung nodules  (b) PET scan denied by insurance, CT A/P and bone scan due 05/09/2019  (c) baseline breast MRI 04/30/2019 shows involvement of all 4 quadrants in the right breast as well as right axillary subpectoral and internal mammary lymphadenopathy.  Left breast was unremarkable  (1) neoadjuvant chemotherapy consisting of  (a) pembrolizumab started 05/03/2019, held after first dose because of elevated liver function tests  (b) paclitaxel weekly x12 with carboplatin every 21 days x 4, started 05/08/2019   (i) tolerated carboplatin AUC 5-day 1 poorly, switched to AUC 2 on day 1 day 8   (ii) cycle 2 delayed 1 week because of elevated liver function tests   (iii) carboplatin and paclitaxel discontinued after 5 doses because of neuropathy, with evidence of response  (c) cyclophosphamide and doxorubicin in dose dense fashion x4 starting 07/03/2019   (2) definitive surgery pending  (3) adjuvant radiation to follow  (4) genetics testing  (a) genetic testing 05/14/2019 through the Common Hereditary Gene Panel offered by [facility] found no deleterious mutations in APC, ATM, AXIN2, BARD1, BMPR1A, BRCA1, BRCA2, BRIP1, CDH1, CDK4, CDKN2A (p14ARF), CDKN2A (p16INK4a), CHEK2, CTNNA1, DICER1, EPCAM (Deletion/duplication testing only), GREM1 (promoter region deletion/duplication testing only), KIT, MEN1, MLH1, MSH2, MSH3, MSH6, MUTYH, NBN, NF1, NHTL1, PALB2, PDGFRA, PMS2, POLD1, POLE, PTEN, RAD50, RAD51C, RAD51D, RNF43, SDHB,  SDHC, SDHD, SMAD4, SMARCA4. STK11, TP53, TSC1, TSC2, and VHL.  The following genes were evaluated for sequence changes only: SDHA and HOXB13 c.251G>A variant only  (5) goserelin started 05/08/2019   PLAN:  Terri Estes is doing better today.  Her rash is improved.  I am unsure what caused it.  Her labs are improved today and she will proceed with her second cycle of doxorubicin and cyclophosphamide.  Due to her rash, and  itching, and the question of rash with the Pembrolizumab, we will wait and see how she does with her second cycle of treatment and consider giving her the Pembrolizumab next week.    She had a difficult time tolerating her first cycle of treatment.  I have added on fluids for Friday, Monday, and Wednesday of next week for her to receive if needed.  Terri Estes had significant mucositis after her last treatment. She is on Fluconazole orally, and I recommended that she continue this.  She is also taking valtrex.  She has magic mouthwash if needed.  Terri Estes will undergo repeat ultrasound of her right breast tomorrow for [place] to evaluate how she is responding to treatment.    Terri Estes will return for IV fluids and I will see her back in clinic next week.  Dr. Jana Hakim plans on joining me for that visit.  She was recommended to continue with the appropriate pandemic precautions. She knows to call between now and her next visit for any questions or concerns.    A total of (20) minutes of face-to-face time was spent with this patient with greater than 50% of that time in counseling and care-coordination.  Wilber Bihari, NP 07/25/19 2:30 PM Medical Oncology and Hematology [facility] [address] [place], [place] [address] Tel. [phone]    Fax. [phone]

## 2019-07-26 ENCOUNTER — Encounter: Payer: Self-pay | Admitting: General Practice

## 2019-07-26 ENCOUNTER — Telehealth: Payer: Self-pay | Admitting: General Practice

## 2019-07-26 ENCOUNTER — Ambulatory Visit: Payer: Medicaid Other

## 2019-07-26 ENCOUNTER — Other Ambulatory Visit: Payer: Medicaid Other

## 2019-07-26 NOTE — Progress Notes (Signed)
[facility] CSW Progress Notes  CSW spoke w Henrene Pastor, head of Section 8 Housing at [employer].  Patient is unlikely to receive voucher any time soon - list is quite extensive and she is not near the top.  Agency is in receipt of appeal letter from [place]; however, regulations state that vouchers are issued in the order the applications are received.  Will notify CSW if any programs/funds become available for this family to relocate.    [name], LCSW Clinical Social Worker Phone:  [phone]

## 2019-07-26 NOTE — Telephone Encounter (Signed)
[facility] CSW Progress Notes  Patient as not contacted [place] to date.  Per [name], "her cell phone is out of battery."  He will give her the number again and encourage her to call.  Stressed to [employer] that no community agency is able negotiate w new landlords on behalf of family - this needs to be done by those renting.  Encouraged him to be part of process or discuss how to get more family involvement in house hunting process as patient has many challenges related to locating new and more suitable housing.  [name], LCSW Clinical Social Worker Phone:  [phone]

## 2019-07-27 ENCOUNTER — Ambulatory Visit: Payer: Medicaid Other

## 2019-07-27 ENCOUNTER — Telehealth: Payer: Self-pay | Admitting: *Deleted

## 2019-07-27 NOTE — Telephone Encounter (Signed)
Left message for a return phone call regarding missed appointment yesterday for her [place].  Left [facility] phone number on voicemail in case patient would like to call and reschedule. Attempted to call other number list [phone] but was unable to leave message because it was full.

## 2019-07-30 ENCOUNTER — Inpatient Hospital Stay: Payer: Medicaid Other | Attending: Oncology

## 2019-07-30 ENCOUNTER — Other Ambulatory Visit: Payer: Self-pay

## 2019-07-30 VITALS — BP 109/80 | HR 98 | Temp 97.8°F | Resp 18

## 2019-07-30 DIAGNOSIS — C50811 Malignant neoplasm of overlapping sites of right female breast: Secondary | ICD-10-CM | POA: Diagnosis present

## 2019-07-30 DIAGNOSIS — R634 Abnormal weight loss: Secondary | ICD-10-CM | POA: Insufficient documentation

## 2019-07-30 DIAGNOSIS — D701 Agranulocytosis secondary to cancer chemotherapy: Secondary | ICD-10-CM | POA: Insufficient documentation

## 2019-07-30 DIAGNOSIS — Z87891 Personal history of nicotine dependence: Secondary | ICD-10-CM | POA: Insufficient documentation

## 2019-07-30 DIAGNOSIS — F329 Major depressive disorder, single episode, unspecified: Secondary | ICD-10-CM | POA: Insufficient documentation

## 2019-07-30 DIAGNOSIS — Z841 Family history of disorders of kidney and ureter: Secondary | ICD-10-CM | POA: Insufficient documentation

## 2019-07-30 DIAGNOSIS — R110 Nausea: Secondary | ICD-10-CM | POA: Insufficient documentation

## 2019-07-30 DIAGNOSIS — G8929 Other chronic pain: Secondary | ICD-10-CM | POA: Insufficient documentation

## 2019-07-30 DIAGNOSIS — M542 Cervicalgia: Secondary | ICD-10-CM | POA: Insufficient documentation

## 2019-07-30 DIAGNOSIS — N6320 Unspecified lump in the left breast, unspecified quadrant: Secondary | ICD-10-CM | POA: Insufficient documentation

## 2019-07-30 DIAGNOSIS — R5383 Other fatigue: Secondary | ICD-10-CM | POA: Insufficient documentation

## 2019-07-30 DIAGNOSIS — Z79899 Other long term (current) drug therapy: Secondary | ICD-10-CM | POA: Diagnosis not present

## 2019-07-30 DIAGNOSIS — R21 Rash and other nonspecific skin eruption: Secondary | ICD-10-CM | POA: Insufficient documentation

## 2019-07-30 DIAGNOSIS — Z803 Family history of malignant neoplasm of breast: Secondary | ICD-10-CM | POA: Insufficient documentation

## 2019-07-30 DIAGNOSIS — C773 Secondary and unspecified malignant neoplasm of axilla and upper limb lymph nodes: Secondary | ICD-10-CM | POA: Insufficient documentation

## 2019-07-30 DIAGNOSIS — Z8269 Family history of other diseases of the musculoskeletal system and connective tissue: Secondary | ICD-10-CM | POA: Insufficient documentation

## 2019-07-30 DIAGNOSIS — L299 Pruritus, unspecified: Secondary | ICD-10-CM | POA: Diagnosis not present

## 2019-07-30 DIAGNOSIS — Z171 Estrogen receptor negative status [ER-]: Secondary | ICD-10-CM | POA: Insufficient documentation

## 2019-07-30 DIAGNOSIS — Z5111 Encounter for antineoplastic chemotherapy: Secondary | ICD-10-CM | POA: Insufficient documentation

## 2019-07-30 DIAGNOSIS — F1290 Cannabis use, unspecified, uncomplicated: Secondary | ICD-10-CM | POA: Insufficient documentation

## 2019-07-30 DIAGNOSIS — Z802 Family history of malignant neoplasm of other respiratory and intrathoracic organs: Secondary | ICD-10-CM | POA: Insufficient documentation

## 2019-07-30 DIAGNOSIS — R590 Localized enlarged lymph nodes: Secondary | ICD-10-CM | POA: Insufficient documentation

## 2019-07-30 DIAGNOSIS — R630 Anorexia: Secondary | ICD-10-CM | POA: Diagnosis not present

## 2019-07-30 DIAGNOSIS — Z5189 Encounter for other specified aftercare: Secondary | ICD-10-CM | POA: Insufficient documentation

## 2019-07-30 DIAGNOSIS — R918 Other nonspecific abnormal finding of lung field: Secondary | ICD-10-CM | POA: Insufficient documentation

## 2019-07-30 DIAGNOSIS — Z800 Family history of malignant neoplasm of digestive organs: Secondary | ICD-10-CM | POA: Diagnosis not present

## 2019-07-30 MED ORDER — SODIUM CHLORIDE 0.9 % IV SOLN
INTRAVENOUS | Status: DC
  Administered 2019-07-30: 09:00:00 via INTRAVENOUS
  Filled 2019-07-30 (×2): qty 250

## 2019-07-30 MED ORDER — SODIUM CHLORIDE 0.9% FLUSH
10.0000 mL | INTRAVENOUS | Status: DC | PRN
  Administered 2019-07-30: 10:00:00 10 mL
  Filled 2019-07-30: qty 10

## 2019-07-30 MED ORDER — ONDANSETRON HCL 4 MG/2ML IJ SOLN
INTRAMUSCULAR | Status: AC
  Filled 2019-07-30: qty 4

## 2019-07-30 MED ORDER — HEPARIN SOD (PORK) LOCK FLUSH 100 UNIT/ML IV SOLN
500.0000 [IU] | Freq: Once | INTRAVENOUS | Status: AC | PRN
  Administered 2019-07-30: 10:00:00 500 [IU]
  Filled 2019-07-30: qty 5

## 2019-07-30 MED ORDER — SODIUM CHLORIDE 0.9 % IV SOLN: 8.0000 mg | Freq: Once | INTRAVENOUS | Status: DC

## 2019-07-30 MED ORDER — ONDANSETRON HCL 4 MG/2ML IJ SOLN
8.0000 mg | Freq: Once | INTRAMUSCULAR | Status: AC
  Administered 2019-07-30: 10:00:00 8 mg via INTRAVENOUS

## 2019-07-30 NOTE — Patient Instructions (Signed)
[facility] Discharge Instructions for Patients    To help prevent nausea and vomiting after your treatment, we encourage you to take your nausea medication as directed  If you develop nausea and vomiting that is not controlled by your nausea medication, call the clinic.   BELOW ARE SYMPTOMS THAT SHOULD BE REPORTED IMMEDIATELY:  *FEVER GREATER THAN 100.5 F  *CHILLS WITH OR WITHOUT FEVER  NAUSEA AND VOMITING THAT IS NOT CONTROLLED WITH YOUR NAUSEA MEDICATION  *UNUSUAL SHORTNESS OF BREATH  *UNUSUAL BRUISING OR BLEEDING  TENDERNESS IN MOUTH AND THROAT WITH OR WITHOUT PRESENCE OF ULCERS  *URINARY PROBLEMS  *BOWEL PROBLEMS  UNUSUAL RASH Items with * indicate a potential emergency and should be followed up as soon as possible.  Feel free to call the clinic should you have any questions or concerns. The clinic phone number is [phone].  Please show the [facility] at check-in to the Emergency Department and triage nurse.    Dehydration, Adult  Dehydration is when there is not enough fluid or water in your body. This happens when you lose more fluids than you take in. Dehydration can range from mild to very bad. It should be treated right away to keep it from getting very bad. Symptoms of mild dehydration may include:  Thirst.  Dry lips.  Slightly dry mouth.  Dry, warm skin.  Dizziness. Symptoms of moderate dehydration may include:  Very dry mouth.  Muscle cramps.  Dark pee (urine). Pee may be the color of tea.  Your body making less pee.  Your eyes making fewer tears.  Heartbeat that is uneven or faster than normal (palpitations).  Headache.  Light-headedness, especially when you stand up from sitting.  Fainting (syncope). Symptoms of very bad dehydration may include:  Changes in skin, such as: ? Cold and clammy skin. ? Blotchy (mottled) or pale skin. ? Skin that does not quickly return to normal after being lightly pinched and  let go (poor skin turgor).  Changes in body fluids, such as: ? Feeling very thirsty. ? Your eyes making fewer tears. ? Not sweating when body temperature is high, such as in hot weather. ? Your body making very little pee.  Changes in vital signs, such as: ? Weak pulse. ? Pulse that is more than 100 beats a minute when you are sitting still. ? Fast breathing. ? Low blood pressure.  Other changes, such as: ? Sunken eyes. ? Cold hands and feet. ? Confusion. ? Lack of energy (lethargy). ? Trouble waking up from sleep. ? Short-term weight loss. ? Unconsciousness. Follow these instructions at home:   If told by your doctor, drink an ORS: ? Make an ORS by using instructions on the package. ? Start by drinking small amounts, about  cup (120 mL) every 5-10 minutes. ? Slowly drink more until you have had the amount that your doctor said to have.  Drink enough clear fluid to keep your pee clear or pale yellow. If you were told to drink an ORS, finish the ORS first, then start slowly drinking clear fluids. Drink fluids such as: ? Water. Do not drink only water by itself. Doing that can make the salt (sodium) level in your body get too low (hyponatremia). ? Ice chips. ? Fruit juice that you have added water to (diluted). ? Low-calorie sports drinks.  Avoid: ? Alcohol. ? Drinks that have a lot of sugar. These include high-calorie sports drinks, fruit juice that does not have water added, and  soda. ? Caffeine. ? Foods that are greasy or have a lot of fat or sugar.  Take over-the-counter and prescription medicines only as told by your doctor.  Do not take salt tablets. Doing that can make the salt level in your body get too high (hypernatremia).  Eat foods that have minerals (electrolytes). Examples include bananas, oranges, potatoes, tomatoes, and spinach.  Keep all follow-up visits as told by your doctor. This is important. Contact a doctor if:  You have belly (abdominal) pain  that: ? Gets worse. ? Stays in one area (localizes).  You have a rash.  You have a stiff neck.  You get angry or annoyed more easily than normal (irritability).  You are more sleepy than normal.  You have a harder time waking up than normal.  You feel: ? Weak. ? Dizzy. ? Very thirsty.  You have peed (urinated) only a small amount of very dark pee during 6-8 hours. Get help right away if:  You have symptoms of very bad dehydration.  You cannot drink fluids without throwing up (vomiting).  Your symptoms get worse with treatment.  You have a fever.  You have a very bad headache.  You are throwing up or having watery poop (diarrhea) and it: ? Gets worse. ? Does not go away.  You have blood or something green (bile) in your throw-up.  You have blood in your poop (stool). This may cause poop to look black and tarry.  You have not peed in 6-8 hours.  You pass out (faint).  Your heart rate when you are sitting still is more than 100 beats a minute.  You have trouble breathing. This information is not intended to replace advice given to you by your health care provider. Make sure you discuss any questions you have with your health care provider. Document Released: 10/09/2009 Document Revised: 11/25/2017 Document Reviewed: 02/06/2016 Elsevier Patient Education  2020 [employer].

## 2019-08-01 ENCOUNTER — Inpatient Hospital Stay: Payer: Medicaid Other | Admitting: Adult Health

## 2019-08-01 ENCOUNTER — Emergency Department (HOSPITAL_COMMUNITY)
Admission: EM | Admit: 2019-08-01 | Discharge: 2019-08-02 | Disposition: A | Discharge status: Home / Self Care | Payer: Medicaid Other | Attending: Emergency Medicine | Admitting: Emergency Medicine

## 2019-08-01 ENCOUNTER — Encounter (HOSPITAL_COMMUNITY): Payer: Self-pay

## 2019-08-01 ENCOUNTER — Inpatient Hospital Stay: Payer: Medicaid Other

## 2019-08-01 ENCOUNTER — Encounter: Payer: Self-pay | Admitting: General Practice

## 2019-08-01 ENCOUNTER — Other Ambulatory Visit: Payer: Self-pay

## 2019-08-01 ENCOUNTER — Telehealth: Payer: Self-pay | Admitting: Adult Health

## 2019-08-01 DIAGNOSIS — J45909 Unspecified asthma, uncomplicated: Secondary | ICD-10-CM | POA: Diagnosis not present

## 2019-08-01 DIAGNOSIS — Z79899 Other long term (current) drug therapy: Secondary | ICD-10-CM | POA: Insufficient documentation

## 2019-08-01 DIAGNOSIS — Z87891 Personal history of nicotine dependence: Secondary | ICD-10-CM | POA: Insufficient documentation

## 2019-08-01 DIAGNOSIS — R45850 Homicidal ideations: Secondary | ICD-10-CM

## 2019-08-01 DIAGNOSIS — R4589 Other symptoms and signs involving emotional state: Secondary | ICD-10-CM | POA: Diagnosis not present

## 2019-08-01 DIAGNOSIS — C50919 Malignant neoplasm of unspecified site of unspecified female breast: Secondary | ICD-10-CM | POA: Insufficient documentation

## 2019-08-01 DIAGNOSIS — Z046 Encounter for general psychiatric examination, requested by authority: Secondary | ICD-10-CM | POA: Diagnosis present

## 2019-08-01 DIAGNOSIS — R456 Violent behavior: Secondary | ICD-10-CM | POA: Diagnosis not present

## 2019-08-01 DIAGNOSIS — F332 Major depressive disorder, recurrent severe without psychotic features: Secondary | ICD-10-CM | POA: Diagnosis not present

## 2019-08-01 LAB — COMPREHENSIVE METABOLIC PANEL
ALT: 100 U/L — ABNORMAL HIGH (ref 0–44)
AST: 37 U/L (ref 15–41)
Albumin: 3.8 g/dL (ref 3.5–5.0)
Alkaline Phosphatase: 107 U/L (ref 38–126)
Anion gap: 11 (ref 5–15)
BUN: 11 mg/dL (ref 6–20)
CO2: 27 mmol/L (ref 22–32)
Calcium: 9.4 mg/dL (ref 8.9–10.3)
Chloride: 96 mmol/L — ABNORMAL LOW (ref 98–111)
Creatinine, Ser: 0.82 mg/dL (ref 0.44–1.00)
GFR calc Af Amer: >60 mL/min (ref >60)
GFR calc non Af Amer: >60 mL/min (ref >60)
Glucose, Bld: 106 mg/dL — ABNORMAL HIGH (ref 70–99)
Potassium: 3.3 mmol/L — ABNORMAL LOW (ref 3.5–5.1)
Sodium: 134 mmol/L — ABNORMAL LOW (ref 135–145)
Total Bilirubin: 0.7 mg/dL (ref 0.3–1.2)
Total Protein: 6.8 g/dL (ref 6.5–8.1)

## 2019-08-01 LAB — CBC
HCT: 33.7 % — ABNORMAL LOW (ref 36.0–46.0)
Hemoglobin: 11.4 g/dL — ABNORMAL LOW (ref 12.0–15.0)
MCH: 32.9 pg (ref 26.0–34.0)
MCHC: 33.8 g/dL (ref 30.0–36.0)
MCV: 97.1 fL (ref 80.0–100.0)
Platelets: 261 10*3/uL (ref 150–400)
RBC: 3.47 MIL/uL — ABNORMAL LOW (ref 3.87–5.11)
RDW: 17.2 % — ABNORMAL HIGH (ref 11.5–15.5)
WBC: 2 10*3/uL — ABNORMAL LOW (ref 4.0–10.5)
nRBC: 0 % (ref 0.0–0.2)

## 2019-08-01 LAB — ACETAMINOPHEN LEVEL: Acetaminophen (Tylenol), Serum: <10 ug/mL — ABNORMAL LOW (ref 10–30)

## 2019-08-01 LAB — I-STAT BETA HCG BLOOD, ED (MC, WL, AP ONLY): I-stat hCG, quantitative: <5 m[IU]/mL (ref <5)

## 2019-08-01 LAB — SALICYLATE LEVEL: Salicylate Lvl: <7 mg/dL (ref 2.8–30.0)

## 2019-08-01 LAB — DIFFERENTIAL
Basophils Absolute: 0 10*3/uL (ref 0.0–0.1)
Basophils Relative: 1 %
Eosinophils Absolute: 0 10*3/uL (ref 0.0–0.5)
Eosinophils Relative: 1 %
Lymphocytes Relative: 43 %
Lymphs Abs: 0.9 10*3/uL (ref 0.7–4.0)
Monocytes Absolute: 0.1 10*3/uL (ref 0.1–1.0)
Monocytes Relative: 4 %
Neutro Abs: 1 10*3/uL — ABNORMAL LOW (ref 1.7–7.7)
Neutrophils Relative %: 50 %

## 2019-08-01 LAB — ETHANOL: Alcohol, Ethyl (B): <10 mg/dL (ref <10)

## 2019-08-01 MED ORDER — PROCHLORPERAZINE EDISYLATE 10 MG/2ML IJ SOLN
10.0000 mg | Freq: Once | INTRAMUSCULAR | Status: AC
  Administered 2019-08-01: 10 mg via INTRAMUSCULAR
  Filled 2019-08-01: qty 2

## 2019-08-01 MED ORDER — DIPHENHYDRAMINE HCL 50 MG/ML IJ SOLN
25.0000 mg | Freq: Once | INTRAMUSCULAR | Status: AC
  Administered 2019-08-01: 25 mg via INTRAMUSCULAR
  Filled 2019-08-01: qty 1

## 2019-08-01 NOTE — Progress Notes (Deleted)
[facility]  [phone] [phone]    ID: Terri Estes DOB: May 19, 1986  MR#: [id number]  FMB#:[id number]  Patient Care Team: Patient, No Pcp Per as PCP - General (General Practice) Alphonsa Overall, MD as Consulting Physician (General Surgery) Magrinat, [name], MD as Consulting Physician (Oncology) Kyung Rudd, MD as Consulting Physician (Radiation Oncology) Rockwell Germany, RN as Oncology Nurse Navigator Mauro Kaufmann, RN as Oncology Nurse Navigator Donnamae Jude, MD as Consulting Physician (Obstetrics and Gynecology) Pavelock, Ralene Bathe, MD as Consulting Physician (Internal Medicine) OTHER MD: Joylene Igo for [facility] is [phone]   CHIEF COMPLAINT: Triple negative breast cancer  CURRENT TREATMENT: Neoadjuvant chemotherapy[/immunotherapy]/goserelin   INTERVAL HISTORY: Terri Estes returns today for follow-up and treatment of her estrogen negative breast cancer and is undergoing neoadjuvant chemotherapy.  She stopped receiving Paclitaxel and Carboplatin after 5 cycles due to peripheral neuropathy.  Now, she is receiving Doxorubicin and Cyclophspahmide given on day 1 of a 14 day cycle with onpro support.  Today is cycle 2 day 8.    Terri Estes had initially received Pembrolizumab, after which she developed a rash of unclear etiology.  She is due to restart this today.   REVIEW OF SYSTEMS: Terri Estes   HISTORY OF CURRENT ILLNESS: From the original intake note:  Terri Estes presented with swelling along the left side of the face with neck pain. She then underwent a neck CT on 04/11/2019 showing: Enlarged left level 2 lymph nodes with associated inflammatory change including stranding of the adjacent fat and thickening of the platysma. While malignancy is not excluded, this is most concerning for infection. No focal abscess is present. Hypoattenuation within 1 of the left level 2 lymph nodes likely represents central necrosis or suppuration  of the node. No primary malignancy or other abscess. Left upper lobe pulmonary nodules may be inflammatory. The largest measures 0.7 x 0.7 x 0.6 cm. CT of the chest with contrast may be useful for further evaluation.  She also underwent a chest CT on the same day showing: Complex right breast mass measuring 5.6 x 4.5 x 4.7 cm consistent with a primary breast carcinoma. Malignant right axillary adenopathy measuring up to 3.1 cm. Multiple bilateral pulmonary nodules are concerning for metastatic disease. Given the right breast mass, the left cervical lymph nodes are more likely malignant.  She then underwent bilateral diagnostic mammography with tomography and right breast ultrasonography at [facility] on 04/19/2019 showing: Breast Density Category C; findings which are highly suspicious for multicentric inflammatory right breast cancer with right axillary nodal metastatic disease; no mammographic evidence of malignancy involving the left breast.  Accordingly on 04/19/2019 she proceeded to biopsy of the right breast area in question. The pathology from this procedure showed ([id number]): invasive ductal carcinoma, grade III, upper inner quadrant, 12:30 o'clock, 5.0 cm from the nipple. Prognostic indicators significant for: estrogen receptor, 0% negative and progesterone receptor, 0% negative. Proliferation marker Ki67 at 90%. HER2 negative (0+).  Additional biopsies of the right breast and right axilla were performed on the same day. The pathology from this procedure showed ([id number]): 2. Breast, right, needle core biopsy, satellite mass UOQ, 10 o'clock, 8cmfn  - Invasive ductal carcinoma, grade III 3. Lymph node, needle/core biopsy, level 1 right axilla   - Invasive ductal carcinoma, grade III  The patient's subsequent history is as detailed below.   PAST MEDICAL HISTORY: Past Medical History:  Diagnosis Date   Anemia    after pregnancy   Anxiety  severe not taking meds   Asthma     inhaler  4x per day   Chronic pain    Family history of breast cancer    History of substance abuse ([place])    Pregnancy induced hypertension    takes medication now.   Psoriasis    Trichomonas infection    Vaginal Pap smear, abnormal      PAST SURGICAL HISTORY: Past Surgical History:  Procedure Laterality Date   CESAREAN SECTION  05/26/2008   CESAREAN SECTION N/A 07/10/2017   Procedure: CESAREAN SECTION;  Surgeon: Florian Buff, MD;  Location: [facility];  Service: Obstetrics;  Laterality: N/A;   CESAREAN SECTION N/A 09/23/2018   Procedure: CESAREAN SECTION;  Surgeon: Donnamae Jude, MD;  Location: [facility];  Service: Obstetrics;  Laterality: N/A;   COLPOSCOPY     PORTACATH PLACEMENT Left 04/30/2019   Procedure: INSERTION PORT-A-CATH WITH ULTRASOUND;  Surgeon: Alphonsa Overall, MD;  Location: [facility];  Service: General;  Laterality: Left;  Left Subclavian vein   WISDOM TOOTH EXTRACTION       FAMILY HISTORY: Family History  Problem Relation Age of Onset   Kidney disease Maternal Grandmother    Breast cancer Maternal Grandmother 28       metastatic to liver; d. 32   Kidney disease Maternal Grandfather    Kidney disease Paternal Grandmother    Diabetes Paternal Grandmother    Breast cancer Paternal Grandmother    Kidney disease Paternal Grandfather    Breast cancer Paternal Aunt    Cerebral palsy Child    Breast cancer Paternal Great-grandmother    Pancreatic cancer Other        PGMs brother    Throat cancer Paternal Uncle    Breast cancer Other        PGMs sister  (Updated 04/25/2019) Terri Estes's father is living at age 17. Patients' mother is also living at age 19. [place] notes, however, that she was raised by her grandmother.) The patient has 0 brothers and 5 sisters. Patient denies anyone in her family having ovarian, prostate, or pancreatic cancer. Her maternal grandmother was diagnosed with breast cancer, unsure what age. On  her father's side, she reports her grandmother had cysts in her breasts, her great-aunt might have had breast cancer, and her great-grandmother was diagnosed with breast cancer at an older age.   GYNECOLOGIC HISTORY:  No LMP recorded. (Menstrual status: Chemotherapy).  She had an emergent C-section in 09/23/2018 for abruptio placenta at 30 weeks pregnancy, also has a history of eclampsia Menarche:    years old Age at first live birth: 33 years old [place] P: 3 LMP: 04/18/2019 Contraceptive: none HRT: none  Hysterectomy?: no BSO?: no   SOCIAL HISTORY: (Current as of 04/25/2019) Terri Estes is currently unemployed. She previously worked at [employer]. She is currently engaged. Fiance [name] runs a Midwife and works other odd jobs. Daughter Demetrius Charity, age 24, was abused by her father at 63 months and has cerebral palsy. Son [name], age 60, has severe asthma and is developmentally disabled. Daughter Yetta Glassman was born on 09/23/2018 prematurely and remains on oxygen at home.    ADVANCED DIRECTIVES: not in place.    HEALTH MAINTENANCE: Social History   Tobacco Use   Smoking status: Former Smoker    Packs/day: 0.50    Years: 5.00    Pack years: 2.50    Types: Cigarettes    Quit date: 04/10/2019    Years since quitting: 0.3  Smokeless tobacco: Never Used   Tobacco comment: trying to quit  Substance Use Topics   Alcohol use: No   Drug use: Yes    Frequency: 7.0 times per week    Types: Marijuana    Comment: subutex    Colonoscopy: never done  PAP: 05/10/2018  Bone density: never done Mammography: first performed following abnormal CT  No Known Allergies  Current Outpatient Medications  Medication Sig Dispense Refill   acyclovir ointment (ZOVIRAX) 5 % Apply 1 application topically 2 (two) times daily as needed. Mix 1 part acyclovir cream with 1 part over the counter hydrocortisone 1% cream and apply to affected area two times a day as needed 5 g 0   albuterol  (PROVENTIL HFA;VENTOLIN HFA) 108 (90 Base) MCG/ACT inhaler Inhale 2 puffs into the lungs 4 (four) times daily. 1 Inhaler 2   amoxicillin (AMOXIL) 500 MG tablet Take 1 tablet (500 mg total) by mouth 2 (two) times daily. 14 tablet 0   buprenorphine (SUBUTEX) 8 MG SUBL SL tablet Place 8 mg under the tongue 2 (two) times daily.      cholestyramine (QUESTRAN) 4 g packet Take 1 packet (4 g total) by mouth 3 (three) times daily with meals. 60 each 12   cyclobenzaprine (FLEXERIL) 10 MG tablet Take 1 tablet (10 mg total) by mouth 3 (three) times daily as needed for muscle spasms. 90 tablet 2   dexamethasone (DECADRON) 4 MG tablet TAKE 2 TABLETS BY MOUTH DAILY ON THE DAY AFTER CHEMOTHERAPY, AND THEN TAKE 2 TABLETS TWICE DAILY FOR 2 DAYS AFTERWARDS. TAKE WITH FOOD 30 tablet 1   enalapril (VASOTEC) 10 MG tablet TAKE 1 TABLET BY MOUTH DAILY 90 tablet 0   fluconazole (DIFLUCAN) 200 MG tablet Take 1 tablet (200 mg total) by mouth daily. 30 tablet 0   gabapentin (NEURONTIN) 300 MG capsule Take 1 capsule (300 mg total) by mouth 3 (three) times daily. 90 capsule 0   hydrocortisone (ANUSOL-HC) 2.5 % rectal cream Place 1 application rectally 2 (two) times daily. 60 g 0   omeprazole (PRILOSEC) 40 MG capsule Take 1 capsule (40 mg total) by mouth daily. 30 capsule 5   ondansetron (ZOFRAN) 8 MG tablet TAKE 1 TABLET(8 MG) BY MOUTH EVERY 8 HOURS AS NEEDED FOR NAUSEA OR VOMITING 20 tablet 0   PARoxetine (PAXIL) 10 MG tablet Take 1 tablet (10 mg total) by mouth daily. (Patient taking differently: Take 10 mg by mouth daily. Patient is taking 2 tablets daily per MD) 30 tablet 5   predniSONE (DELTASONE) 5 MG tablet 6 tab x 1 day, 5 tab x 1 day, 4 tab x 1 day, 3 tab x 1 day, 2 tab x 1 day, 1 tab x 1 day, stop 21 tablet 0   Prenatal MV-Min-FA-Omega-3 (PRENATAL GUMMIES/DHA & FA) 0.4-32.5 MG CHEW Chew 2 each by mouth daily.     prochlorperazine (COMPAZINE) 10 MG tablet TAKE 1 TABLET(10 MG) BY MOUTH EVERY 6 HOURS AS  NEEDED FOR NAUSEA OR VOMITING 30 tablet 1   valACYclovir (VALTREX) 1000 MG tablet Take 1 tablet (1,000 mg total) by mouth 2 (two) times daily. 60 tablet 0   No current facility-administered medications for this visit.    Facility-Administered Medications Ordered in Other Visits  Medication Dose Route Frequency Provider Last Rate Last Dose   0.9 % NaCl with KCl 20 mEq/ L  infusion   Intravenous Once Magrinat, [name], MD       sodium chloride flush (NS) 0.9 %  injection 10 mL  10 mL Intracatheter PRN Magrinat, [name], MD         OBJECTIVE:   There were no vitals filed for this visit.   There is no height or weight on file to calculate BMI.   Wt Readings from Last 3 Encounters:  07/25/19 201 lb 14.4 oz (91.6 kg)  07/20/19 203 lb 3.2 oz (92.2 kg)  07/18/19 200 lb (90.7 kg)  ECOG FS:1 - Symptomatic but completely ambulatory GENERAL: Young woman in no apparent distress.  Appears slightly tired. HEENT:  Sclerae anicteric. Neck is supple NODES:  No cervical, supraclavicular, or axillary lymphadenopathy palpated.  BREAST EXAM:  Right breast with continue skin changes, but improved form prior, smaller than prior LUNGS:  Clear to auscultation bilaterally.  No wheezes or rhonchi. HEART:  Regular rate and regular rhythm. No murmur appreciated. ABDOMEN:  Soft, nontender.  Positive, normoactive bowel sounds. No organomegaly palpated. MSK:  No focal spinal tenderness to palpation.  EXTREMITIES:  No peripheral edema.  Strength and gait is intact SKIN:  Clear with no obvious rashes or skin changes. No nail dyscrasia. NEURO:  Nonfocal. Well oriented.  Appropriate affect.    LAB RESULTS:  CMP     Component Value Date/Time   NA 142 07/25/2019 0926   NA 137 05/10/2018 1535   K 3.5 07/25/2019 0926   CL 104 07/25/2019 0926   CO2 28 07/25/2019 0926   GLUCOSE 101 (H) 07/25/2019 0926   BUN 20 07/25/2019 0926   BUN 7 05/10/2018 1535   CREATININE 0.93 07/25/2019 0926   CREATININE 0.81  07/03/2019 0919   CALCIUM 9.2 07/25/2019 0926   PROT 6.5 07/25/2019 0926   PROT 7.2 05/10/2018 1535   ALBUMIN 3.2 (L) 07/25/2019 0926   ALBUMIN 4.2 05/10/2018 1535   AST 13 (L) 07/25/2019 0926   AST 54 (H) 07/03/2019 0919   ALT 16 07/25/2019 0926   ALT 102 (H) 07/03/2019 0919   ALKPHOS 69 07/25/2019 0926   BILITOT 0.4 07/25/2019 0926   BILITOT <0.2 (L) 07/03/2019 0919   GFRNONAA >60 07/25/2019 0926   GFRNONAA >60 07/03/2019 0919   GFRAA >60 07/25/2019 0926   GFRAA >60 07/03/2019 0919    No results found for: TOTALPROTELP, ALBUMINELP, A1GS, A2GS, BETS, BETA2SER, GAMS, MSPIKE, SPEI  No results found for: KPAFRELGTCHN, LAMBDASER, [facility]  Lab Results  Component Value Date   WBC 10.0 07/25/2019   NEUTROABS 7.7 07/25/2019   HGB 9.6 (L) 07/25/2019   HCT 29.6 (L) 07/25/2019   MCV 98.7 07/25/2019   PLT 422 (H) 07/25/2019    '@LASTCHEMISTRY' @  No results found for: LABCA2  No components found for: [id number]  No results for input(s): INR in the last 168 hours.  No results found for: LABCA2  No results found for: QMG500  No results found for: BBC488  No results found for: QBV694  No results found for: CA2729  No components found for: HGQUANT  No results found for: CEA1 / No results found for: CEA1   No results found for: AFPTUMOR  No results found for: CHROMOGRNA  No results found for: PSA1  No visits with results within 3 Day(s) from this visit.  Latest known visit with results is:  Appointment on 07/25/2019  Component Date Value Ref Range Status   Sodium 07/25/2019 142  135 - 145 mmol/L Final   Potassium 07/25/2019 3.5  3.5 - 5.1 mmol/L Final   Chloride 07/25/2019 104  98 - 111 mmol/L Final   CO2  07/25/2019 28  22 - 32 mmol/L Final   Glucose, Bld 07/25/2019 101* 70 - 99 mg/dL Final   BUN 07/25/2019 20  6 - 20 mg/dL Final   Creatinine, Ser 07/25/2019 0.93  0.44 - 1.00 mg/dL Final   Calcium 07/25/2019 9.2  8.9 - 10.3 mg/dL Final   Total  Protein 07/25/2019 6.5  6.5 - 8.1 g/dL Final   Albumin 07/25/2019 3.2* 3.5 - 5.0 g/dL Final   AST 07/25/2019 13* 15 - 41 U/L Final   ALT 07/25/2019 16  0 - 44 U/L Final   Alkaline Phosphatase 07/25/2019 69  38 - 126 U/L Final   Total Bilirubin 07/25/2019 0.4  0.3 - 1.2 mg/dL Final   GFR calc non Af Amer 07/25/2019 >60  >60 mL/min Final   GFR calc Af Amer 07/25/2019 >60  >60 mL/min Final   Anion gap 07/25/2019 10  5 - 15 Final   Performed at [facility], [place] [address], [place], [place] [address]   WBC 07/25/2019 10.0  4.0 - 10.5 K/uL Final   RBC 07/25/2019 3.00* 3.87 - 5.11 MIL/uL Final   Hemoglobin 07/25/2019 9.6* 12.0 - 15.0 g/dL Final   HCT 07/25/2019 29.6* 36.0 - 46.0 % Final   MCV 07/25/2019 98.7  80.0 - 100.0 fL Final   MCH 07/25/2019 32.0  26.0 - 34.0 pg Final   MCHC 07/25/2019 32.4  30.0 - 36.0 g/dL Final   RDW 07/25/2019 17.7* 11.5 - 15.5 % Final   Platelets 07/25/2019 422* 150 - 400 K/uL Final   nRBC 07/25/2019 3.1* 0.0 - 0.2 % Final   Neutrophils Relative % 07/25/2019 77  % Final   Neutro Abs 07/25/2019 7.7  1.7 - 7.7 K/uL Final   Lymphocytes Relative 07/25/2019 13  % Final   Lymphs Abs 07/25/2019 1.3  0.7 - 4.0 K/uL Final   Monocytes Relative 07/25/2019 8  % Final   Monocytes Absolute 07/25/2019 0.8  0.1 - 1.0 K/uL Final   Eosinophils Relative 07/25/2019 0  % Final   Eosinophils Absolute 07/25/2019 0.0  0.0 - 0.5 K/uL Final   Basophils Relative 07/25/2019 0  % Final   Basophils Absolute 07/25/2019 0.0  0.0 - 0.1 K/uL Final   Immature Granulocytes 07/25/2019 2  % Final   Abs Immature Granulocytes 07/25/2019 0.23* 0.00 - 0.07 K/uL Final   Performed at [facility], [place] [address] [place], [place] [address]    (this displays the last labs from the last 3 days)  No results found for: TOTALPROTELP, ALBUMINELP, A1GS, A2GS, BETS, BETA2SER, GAMS, MSPIKE, SPEI (this displays SPEP labs)  No  results found for: KPAFRELGTCHN, LAMBDASER, KAPLAMBRATIO (kappa/lambda light chains)  Lab Results  Component Value Date   HGBA 97.4 12/16/2016   (Hemoglobinopathy evaluation)   No results found for: LDH  No results found for: IRON, TIBC, IRONPCTSAT (Iron and TIBC)  No results found for: FERRITIN  Urinalysis    Component Value Date/Time   COLORURINE YELLOW 06/26/2019 1300   APPEARANCEUR HAZY (A) 06/26/2019 1300   LABSPEC 1.019 06/26/2019 1300   PHURINE 6.0 06/26/2019 1300   GLUCOSEU NEGATIVE 06/26/2019 1300   HGBUR NEGATIVE 06/26/2019 1300   BILIRUBINUR NEGATIVE 06/26/2019 1300   KETONESUR NEGATIVE 06/26/2019 1300   PROTEINUR NEGATIVE 06/26/2019 1300   UROBILINOGEN 4.0 (H) 01/13/2018 1417   NITRITE NEGATIVE 06/26/2019 1300   LEUKOCYTESUR LARGE (A) 06/26/2019 1300     STUDIES:  No results found.   ELIGIBLE FOR AVAILABLE  RESEARCH PROTOCOL: no   ASSESSMENT: 33 y.o. [place] woman status post right breast biopsy x2 and right axillary lymph node biopsy 04/19/2019 for a clinical T4 N2 MX, stage IIIC invasive ductal carcinoma, grade 3, triple negative, with an MIB-1 of 90%  (a) chest CT scan 04/11/2019 shows multiple subcentimeter lung nodules  (b) PET scan denied by insurance, CT A/P and bone scan due 05/09/2019  (c) baseline breast MRI 04/30/2019 shows involvement of all 4 quadrants in the right breast as well as right axillary subpectoral and internal mammary lymphadenopathy.  Left breast was unremarkable  (1) neoadjuvant chemotherapy consisting of  (a) pembrolizumab started 05/03/2019, held after first dose because of elevated liver function tests  (b) paclitaxel weekly x12 with carboplatin every 21 days x 4, started 05/08/2019   (i) tolerated carboplatin AUC 5-day 1 poorly, switched to AUC 2 on day 1 day 8   (ii) cycle 2 delayed 1 week because of elevated liver function tests   (iii) carboplatin and paclitaxel discontinued after 5 doses because of neuropathy, with  evidence of response  (c) cyclophosphamide and doxorubicin in dose dense fashion x4 starting 07/03/2019   (2) definitive surgery pending  (3) adjuvant radiation to follow  (4) genetics testing  (a) genetic testing 05/14/2019 through the Common Hereditary Gene Panel offered by [facility] found no deleterious mutations in APC, ATM, AXIN2, BARD1, BMPR1A, BRCA1, BRCA2, BRIP1, CDH1, CDK4, CDKN2A (p14ARF), CDKN2A (p16INK4a), CHEK2, CTNNA1, DICER1, EPCAM (Deletion/duplication testing only), GREM1 (promoter region deletion/duplication testing only), KIT, MEN1, MLH1, MSH2, MSH3, MSH6, MUTYH, NBN, NF1, NHTL1, PALB2, PDGFRA, PMS2, POLD1, POLE, PTEN, RAD50, RAD51C, RAD51D, RNF43, SDHB, SDHC, SDHD, SMAD4, SMARCA4. STK11, TP53, TSC1, TSC2, and VHL.  The following genes were evaluated for sequence changes only: SDHA and HOXB13 c.251G>A variant only  (5) goserelin started 05/08/2019   PLAN:  Terri Estes is   She was recommended to continue with the appropriate pandemic precautions. She knows to call between now and her next visit for any questions or concerns.    A total of (20) minutes of face-to-face time was spent with this patient with greater than 50% of that time in counseling and care-coordination.  Wilber Bihari, NP 08/01/19 7:05 AM Medical Oncology and Hematology [facility] [address] [place], [place] [address] Tel. [phone]    Fax. [phone]

## 2019-08-01 NOTE — Telephone Encounter (Signed)
R/s appt per 8/05 sch message -pt aware of new appt date and time

## 2019-08-01 NOTE — ED Provider Notes (Signed)
[facility] EMERGENCY DEPARTMENT Provider Note   CSN: [id number] Arrival date & time: 08/01/19  1651    History   Chief Complaint Chief Complaint  Patient presents with  . Homicidal    HPI Terri Estes is a 33 y.o. female.     33 yo F with a chief complaint of homicidal ideation.  The patient states that she would like to hurt her aunt.  States that she has been really irritating her recently.  Patient found out earlier this year that she has breast cancer.  Has been on multiple medications chemotherapy.  Last had chemotherapy about a week ago.  Has had episodes of leukopenia.  She denies any infectious symptoms currently.  Has had nausea and vomiting and headaches off and on.  No discrete plan to harm her aunt.  She does have a history of mental illness.  Is been off her medications for at least the past month or so.  The history is provided by the patient.  Illness Severity:  Moderate Onset quality:  Gradual Duration:  2 days Timing:  Constant Progression:  Worsening Chronicity:  New Associated symptoms: no chest pain, no congestion, no fever, no headaches, no myalgias, no nausea, no rhinorrhea, no shortness of breath, no vomiting and no wheezing     Past Medical History:  Diagnosis Date  . Anemia    after pregnancy  . Anxiety    severe not taking meds  . Asthma    inhaler  4x per day  . Chronic pain   . Family history of breast cancer   . History of substance abuse ([place])   . Pregnancy induced hypertension    takes medication now.  . Psoriasis   . Trichomonas infection   . Vaginal Pap smear, abnormal     Patient Active Problem List   Diagnosis Date Noted  . Genetic testing 05/14/2019  . Family history of breast cancer   . Malignant neoplasm of overlapping sites of right breast in female, estrogen receptor negative ([place]) 04/25/2019  . Acute kidney injury ([place]) 09/29/2018  . 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  Acute blood loss anemia 09/23/2018  . DIC (disseminated intravascular coagulation) ([place]) 09/23/2018  . S/P cesarean section 09/23/2018  . Inadequate social support 07/21/2018  . Sciatica 07/21/2018  . Abdominal muscle strain, initial encounter 07/21/2018  . History of substance abuse ([place]) 06/12/2018  . Supervision of other normal pregnancy, antepartum 05/10/2018  . Short interval between pregnancies affecting pregnancy, antepartum 05/10/2018  . Hx of preeclampsia, prior pregnancy, currently pregnant 05/10/2018  . History of preterm delivery, currently pregnant 05/10/2018  . Depression affecting pregnancy, antepartum 05/10/2018  . HSIL (high grade squamous intraepithelial lesion) on Pap smear of cervix 12/24/2016  . History of cesarean delivery 12/16/2016    Past Surgical History:  Procedure Laterality Date  . CESAREAN SECTION  05/26/2008  . CESAREAN SECTION N/A 07/10/2017   Procedure: CESAREAN SECTION;  Surgeon: Florian Buff, MD;  Location: [facility];  Service: Obstetrics;  Laterality: N/A;  . CESAREAN SECTION N/A 09/23/2018   Procedure: CESAREAN SECTION;  Surgeon: Donnamae Jude, MD;  Location: [facility];  Service: Obstetrics;  Laterality: N/A;  . COLPOSCOPY    . PORTACATH PLACEMENT Left 04/30/2019   Procedure: INSERTION PORT-A-CATH WITH ULTRASOUND;  Surgeon: Alphonsa Overall, MD;  [facility];  Service: General;  Laterality: Left;  Left Subclavian vein  . WISDOM TOOTH EXTRACTION       OB  History    Gravida  6   Para  3   Term  1   Preterm  2   AB  3   Living  3     SAB      TAB  3   Ectopic      Multiple  0   Live Births  3            Home Medications    Prior to Admission medications   Medication Sig Start Date End Date Taking? Authorizing Provider  acyclovir ointment (ZOVIRAX) 5 % Apply 1 application topically 2 (two) times daily as needed. Mix 1 part acyclovir cream with 1 part over the counter hydrocortisone 1% cream and apply to  affected area two times a day as needed 06/27/19   Gardenia Phlegm, NP  albuterol (PROVENTIL HFA;VENTOLIN HFA) 108 (90 Base) MCG/ACT inhaler Inhale 2 puffs into the lungs 4 (four) times daily. 09/26/18   Donnamae Jude, MD  amoxicillin (AMOXIL) 500 MG tablet Take 1 tablet (500 mg total) by mouth 2 (two) times daily. 07/18/19   Gardenia Phlegm, NP  buprenorphine (SUBUTEX) 8 MG SUBL SL tablet Place 8 mg under the tongue 2 (two) times daily.     [provider]  cholestyramine (QUESTRAN) 4 g packet Take 1 packet (4 g total) by mouth 3 (three) times daily with meals. 05/11/19   Magrinat, [name], MD  cyclobenzaprine (FLEXERIL) 10 MG tablet Take 1 tablet (10 mg total) by mouth 3 (three) times daily as needed for muscle spasms. 09/26/18   Donnamae Jude, MD  dexamethasone (DECADRON) 4 MG tablet TAKE 2 TABLETS BY MOUTH DAILY ON THE DAY AFTER CHEMOTHERAPY, AND THEN TAKE 2 TABLETS TWICE DAILY FOR 2 DAYS AFTERWARDS. TAKE WITH FOOD 06/26/19   Magrinat, [name], MD  enalapril (VASOTEC) 10 MG tablet TAKE 1 TABLET BY MOUTH DAILY 06/07/19   Donnamae Jude, MD  fluconazole (DIFLUCAN) 200 MG tablet Take 1 tablet (200 mg total) by mouth daily. 07/25/19   Gardenia Phlegm, NP  gabapentin (NEURONTIN) 300 MG capsule Take 1 capsule (300 mg total) by mouth 3 (three) times daily. 05/08/19   Gardenia Phlegm, NP  hydrocortisone (ANUSOL-HC) 2.5 % rectal cream Place 1 application rectally 2 (two) times daily. 07/18/19   Tanner, Lyndon Code., PA-C  omeprazole (PRILOSEC) 40 MG capsule Take 1 capsule (40 mg total) by mouth daily. 05/11/19   Tanner, Lyndon Code., PA-C  ondansetron (ZOFRAN) 8 MG tablet TAKE 1 TABLET(8 MG) BY MOUTH EVERY 8 HOURS AS NEEDED FOR NAUSEA OR VOMITING 06/07/19   Magrinat, [name], MD  PARoxetine (PAXIL) 10 MG tablet Take 1 tablet (10 mg total) by mouth daily. Patient taking differently: Take 10 mg by mouth daily. Patient is taking 2 tablets daily per MD 05/11/19   Harle Stanford., PA-C   predniSONE (DELTASONE) 5 MG tablet 6 tab x 1 day, 5 tab x 1 day, 4 tab x 1 day, 3 tab x 1 day, 2 tab x 1 day, 1 tab x 1 day, stop 07/20/19   Harle Stanford., PA-C  Prenatal MV-Min-FA-Omega-3 (PRENATAL GUMMIES/DHA & FA) 0.4-32.5 MG CHEW Chew 2 each by mouth daily.    [provider]  prochlorperazine (COMPAZINE) 10 MG tablet TAKE 1 TABLET(10 MG) BY MOUTH EVERY 6 HOURS AS NEEDED FOR NAUSEA OR VOMITING 06/26/19   Magrinat, [name], MD  valACYclovir (VALTREX) 1000 MG tablet Take 1 tablet (1,000 mg total) by mouth  2 (two) times daily. 06/26/19   Gardenia Phlegm, NP  enalapril (VASOTEC) 10 MG tablet TAKE 1 TABLET BY MOUTH DAILY Patient taking differently: Take 10 mg by mouth daily.  03/15/19   Donnamae Jude, MD    Family History Family History  Problem Relation Age of Onset  . Kidney disease Maternal Grandmother   . Breast cancer Maternal Grandmother 28       metastatic to liver; d. 70  . Kidney disease Maternal Grandfather   . Kidney disease Paternal Grandmother   . Diabetes Paternal Grandmother   . Breast cancer Paternal Grandmother   . Kidney disease Paternal Grandfather   . Breast cancer Paternal Aunt   . Cerebral palsy Child   . Breast cancer Paternal Great-grandmother   . Pancreatic cancer Other        PGMs brother   . Throat cancer Paternal Uncle   . Breast cancer Other        PGMs sister    Social History Social History   Tobacco Use  . Smoking status: Former Smoker    Packs/day: 0.50    Years: 5.00    Pack years: 2.50    Types: Cigarettes    Quit date: 04/10/2019    Years since quitting: 0.3  . Smokeless tobacco: Never Used  . Tobacco comment: trying to quit  Substance Use Topics  . Alcohol use: No  . Drug use: Yes    Frequency: 7.0 times per week    Types: Marijuana    Comment: subutex     Allergies   Patient has no known allergies.   Review of Systems Review of Systems  Constitutional: Negative for chills and fever.  HENT: Negative for  congestion and rhinorrhea.   Eyes: Negative for redness and visual disturbance.  Respiratory: Negative for shortness of breath and wheezing.   Cardiovascular: Negative for chest pain and palpitations.  Gastrointestinal: Negative for nausea and vomiting.  Genitourinary: Negative for dysuria and urgency.  Musculoskeletal: Negative for arthralgias and myalgias.  Skin: Negative for pallor and wound.  Neurological: Negative for dizziness and headaches.     Physical Exam Updated Vital Signs BP 100/82   Pulse 94   Temp 98.7 F (37.1 C)   Resp 16   SpO2 99%   Physical Exam Vitals signs and nursing note reviewed.  Constitutional:      General: She is not in acute distress.    Appearance: She is well-developed. She is not diaphoretic.  HENT:     Head: Normocephalic and atraumatic.  Eyes:     Pupils: Pupils are equal, round, and reactive to light.  Neck:     Musculoskeletal: Normal range of motion and neck supple.  Cardiovascular:     Rate and Rhythm: Normal rate and regular rhythm.     Heart sounds: No murmur. No friction rub. No gallop.   Pulmonary:     Effort: Pulmonary effort is normal.     Breath sounds: No wheezing or rales.  Abdominal:     General: There is no distension.     Palpations: Abdomen is soft.     Tenderness: There is no abdominal tenderness.  Musculoskeletal:        General: No tenderness.  Skin:    General: Skin is warm and dry.  Neurological:     Mental Status: She is alert and oriented to person, place, and time.  Psychiatric:        Behavior: Behavior normal.  ED Treatments / Results  Labs (all labs ordered are listed, but only abnormal results are displayed) Labs Reviewed  COMPREHENSIVE METABOLIC PANEL - Abnormal; Notable for the following components:      Result Value   Sodium 134 (*)    Potassium 3.3 (*)    Chloride 96 (*)    Glucose, Bld 106 (*)    ALT 100 (*)    All other components within normal limits  ACETAMINOPHEN LEVEL -  Abnormal; Notable for the following components:   Acetaminophen (Tylenol), Serum <10 (*)    All other components within normal limits  CBC - Abnormal; Notable for the following components:   WBC 2.0 (*)    RBC 3.47 (*)    Hemoglobin 11.4 (*)    HCT 33.7 (*)    RDW 17.2 (*)    All other components within normal limits  ETHANOL  SALICYLATE LEVEL  RAPID URINE DRUG SCREEN, HOSP PERFORMED  DIFFERENTIAL  I-STAT BETA HCG BLOOD, ED ([facility], [facility], [facility] ONLY)    EKG None  Radiology No results found.  Procedures Procedures (including critical care time)  Medications Ordered in ED Medications  prochlorperazine (COMPAZINE) injection 10 mg (has no administration in time range)  diphenhydrAMINE (BENADRYL) injection 25 mg (has no administration in time range)     Initial Impression / Assessment and Plan / ED Course  I have reviewed the triage vital signs and the nursing notes.  Pertinent labs & imaging results that were available during my care of the patient were reviewed by me and considered in my medical decision making (see chart for details).        33 yo F with a chief complaints of homicidal ideation.  Patient has been irritated with her family members over the past few days.  She states is too much for being on chemotherapy and having children to take care of.  She has had nausea off and on with the chemotherapy as well as headaches.  Has been able to eat and drink somewhat normally.  We will give the patient a headache cocktail.  CBC shows leukopenia though there is no differential will add a differential.    TTS recommends seeing the patient again in the morning.   The patients results and plan were reviewed and discussed.   Any x-rays performed were independently reviewed by myself.   Differential diagnosis were considered with the presenting HPI.  Medications  prochlorperazine (COMPAZINE) injection 10 mg (has no administration in time range)  diphenhydrAMINE (BENADRYL)  injection 25 mg (has no administration in time range)    Vitals:   08/01/19 1659 08/01/19 1843  BP: (!) 125/91 100/82  Pulse: (!) 114 94  Resp: 19 16  Temp: 98.7 F (37.1 C) 98.7 F (37.1 C)  TempSrc: Oral   SpO2: 100% 99%    Final diagnoses:  Homicidal ideation       Final Clinical Impressions(s) / ED Diagnoses   Final diagnoses:  Homicidal ideation    ED Discharge Orders    None       Deno Etienne, DO 08/01/19 2246

## 2019-08-01 NOTE — ED Notes (Signed)
Breast cancer diagnosed  April  On chemotherapy she has a porta cath due for a chemo treatment tomorrow  Stage 4 breast cancer

## 2019-08-01 NOTE — ED Triage Notes (Signed)
To triage via [employer].  Pt tearful, states "I'm not feeling well" and "going through a lot".  Denies SI, endorses HI.  Pt states she does not have any SI thoughts at this present time but has in the past month.  Pt is mad at aunt and fiance.  Does not have a plan.  When asked what stops her from carrying out plan pt states "babies are there".  Pt s Pt is undergoing chemo for right sided breast CA, stage 4.  Pt was supposed to go to chemo today but fiance did not show up at the house to pick her up and take her.  Chemo rescheduled for tomorrow.

## 2019-08-01 NOTE — BH Assessment (Addendum)
Tele Assessment Note   Patient Name: Terri Estes MRN: [id number] Referring Physician: ED Physician Location of Patient: [facility] Location of Provider: [place] is an 33 y.o. female. Pt has been diagnosed with stage 4 breast cancer and is currently receiving chemotherapy. Pt was brought to the ED by [employer]. Per Pt she contacted [employer] because she was angry at her fiance and aunt. The Pt said she told [employer] "I'm tired and can't take it any longer."  Per Pt her family is not taking her cancer diagnosis serious. Per Pt they caused her to miss her scheduled chemotherapy appointment today. The Pt states that she was angry and wanted to "fight them." Pt denies wanting to kill them. Pt denies SI and AVH. Pt denies previous SI attempt. 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 Pt states she is not angry at this time and would like outpatient therapy.  Collateral contact from Pt's fiance [name]. Per [name] he does not believe the Pt would harm him or others but he feels she may be a harm to herself.   Shuvon, NP recommends am psych evaluation to due to conflicting information from the Pt and Pt's fiance.   Diagnosis:  F33.2 MDD  Past Medical History:  Past Medical History:  Diagnosis Date  . Anemia    after pregnancy  . Anxiety    severe not taking meds  . Asthma    inhaler  4x per day  . Chronic pain   . Family history of breast cancer   . History of substance abuse ([place])   . Pregnancy induced hypertension    takes medication now.  . Psoriasis   . Trichomonas infection   . Vaginal Pap smear, abnormal     Past Surgical History:  Procedure Laterality Date  . CESAREAN SECTION  05/26/2008  . CESAREAN SECTION N/A 07/10/2017   Procedure: CESAREAN SECTION;  Surgeon: Florian Buff, MD;  Location: [facility];  Service: Obstetrics;  Laterality: N/A;  . CESAREAN SECTION N/A  09/23/2018   Procedure: CESAREAN SECTION;  Surgeon: Donnamae Jude, MD;  Location: [facility];  Service: Obstetrics;  Laterality: N/A;  . COLPOSCOPY    . PORTACATH PLACEMENT Left 04/30/2019   Procedure: INSERTION PORT-A-CATH WITH ULTRASOUND;  Surgeon: Alphonsa Overall, MD;  [facility];  Service: General;  Laterality: Left;  Left Subclavian vein  . WISDOM TOOTH EXTRACTION      Family History:  Family History  Problem Relation Age of Onset  . Kidney disease Maternal Grandmother   . Breast cancer Maternal Grandmother 28       metastatic to liver; d. 6  . Kidney disease Maternal Grandfather   . Kidney disease Paternal Grandmother   . Diabetes Paternal Grandmother   . Breast cancer Paternal Grandmother   . Kidney disease Paternal Grandfather   . Breast cancer Paternal Aunt   . Cerebral palsy Child   . Breast cancer Paternal Great-grandmother   . Pancreatic cancer Other        PGMs brother   . Throat cancer Paternal Uncle   . Breast cancer Other        PGMs sister    Social History:  reports that she quit smoking about 3 months ago. Her smoking use included cigarettes. She has a 2.50 pack-year smoking history. She has never used smokeless tobacco. She reports current drug use. Frequency: 7.00 times  per week. Drug: Marijuana. She reports that she does not drink alcohol.  Additional Social History:  Alcohol / Drug Use Pain Medications: please see mar Prescriptions: please see mar Over the Counter: please see mar History of alcohol / drug use?: No history of alcohol / drug abuse Longest period of sobriety (when/how long): 3 years  CIWA: CIWA-Ar BP: (!) 125/91 Pulse Rate: (!) 114(pt crying) COWS:    Allergies: No Known Allergies  Home Medications: (Not in a hospital admission)   OB/GYN Status:  No LMP recorded. (Menstrual status: Chemotherapy).  General Assessment Data Location of Assessment: [facility] ED TTS Assessment: In system Is this a Tele or Face-to-Face  Assessment?: Tele Assessment Is this an Initial Assessment or a Re-assessment for this encounter?: Initial Assessment Patient Accompanied by:: N/A Language Other than English: No Living Arrangements: Other (Comment) What gender do you identify as?: Female Marital status: Single Maiden name: NA Pregnancy Status: No Living Arrangements: Spouse/significant other Can pt return to current living arrangement?: Yes Admission Status: Voluntary Is patient capable of signing voluntary admission?: Yes Referral Source: Self/Family/Friend Insurance type: Medicaid     Crisis Care Plan Living Arrangements: Spouse/significant other Legal Guardian: Other:(NA) Name of Psychiatrist: (NA) Name of Therapist: (NA)  Education Status Is patient currently in school?: No Is the patient employed, unemployed or receiving disability?: Unemployed  Risk to self with the past 6 months Suicidal Ideation: No-Not Currently/Within Last 6 Months Has patient been a risk to self within the past 6 months prior to admission? : No Suicidal Intent: No-Not Currently/Within Last 6 Months Has patient had any suicidal intent within the past 6 months prior to admission? : No Is patient at risk for suicide?: Yes Suicidal Plan?: No-Not Currently/Within Last 6 Months Has patient had any suicidal plan within the past 6 months prior to admission? : No Access to Means: No What has been your use of drugs/alcohol within the last 12 months?: NA Previous Attempts/Gestures: No How many times?: 0 Other Self Harm Risks: NA Triggers for Past Attempts: None known Intentional Self Injurious Behavior: None Comment - Self Injurious Behavior: NA Family Suicide History: No Recent stressful life event(s): Conflict (Comment), Trauma (Comment), Turmoil (Comment) Persecutory voices/beliefs?: No Depression: Yes Depression Symptoms: Tearfulness, Isolating, Loss of interest in usual pleasures, Feeling worthless/self pity, Feeling  angry/irritable Substance abuse history and/or treatment for substance abuse?: No Suicide prevention information given to non-admitted patients: Not applicable  Risk to Others within the past 6 months Homicidal Ideation: No Does patient have any lifetime risk of violence toward others beyond the six months prior to admission? : No Thoughts of Harm to Others: No Current Homicidal Intent: No Current Homicidal Plan: No Access to Homicidal Means: No Identified Victim: NA History of harm to others?: No Assessment of Violence: None Noted Violent Behavior Description: NA Does patient have access to weapons?: No Criminal Charges Pending?: No Does patient have a court date: No Is patient on probation?: No  Psychosis Hallucinations: None noted Delusions: None noted  Mental Status Report Appearance/Hygiene: Unremarkable Eye Contact: Fair Motor Activity: Freedom of movement Speech: Logical/coherent Level of Consciousness: Alert Mood: Depressed Affect: Depressed Anxiety Level: Minimal Thought Processes: Coherent, Relevant Judgement: Unimpaired Orientation: Person, Place, Time, Situation Obsessive Compulsive Thoughts/Behaviors: None  Cognitive Functioning Concentration: Normal Memory: Recent Intact, Remote Intact Is patient IDD: No Insight: Fair Impulse Control: Fair Appetite: Fair Have you had any weight changes? : No Change Sleep: No Change Total Hours of Sleep: 8 Vegetative Symptoms: None  ADLScreening [facility] Assessment  Services) Patient's cognitive ability adequate to safely complete daily activities?: Yes Patient able to express need for assistance with ADLs?: Yes Independently performs ADLs?: Yes (appropriate for developmental age)  Prior Inpatient Therapy Prior Inpatient Therapy: Yes Prior Therapy Dates: 2017 Prior Therapy Facilty/Provider(s): [facility] Reason for Treatment: SA  Prior Outpatient Therapy Prior Outpatient Therapy: No Does patient have an ACCT  team?: No Does patient have Intensive In-House Services?  : No Does patient have [facility] services? : No Does patient have P4CC services?: No  ADL Screening (condition at time of admission) Patient's cognitive ability adequate to safely complete daily activities?: Yes Is the patient deaf or have difficulty hearing?: No Does the patient have difficulty seeing, even when wearing glasses/contacts?: No Does the patient have difficulty concentrating, remembering, or making decisions?: No Patient able to express need for assistance with ADLs?: Yes Does the patient have difficulty dressing or bathing?: No Independently performs ADLs?: Yes (appropriate for developmental age)       Abuse/Neglect Assessment (Assessment to be complete while patient is alone) Abuse/Neglect Assessment Can Be Completed: Yes Physical Abuse: Denies Verbal Abuse: Denies Sexual Abuse: Denies Exploitation of patient/patient's resources: Denies     Regulatory affairs officer (For Healthcare) Does Patient Have a Medical Advance Directive?: No Would patient like information on creating a medical advance directive?: No - Patient declined          Disposition:  Disposition Initial Assessment Completed for this Encounter: Yes  This service was provided via telemedicine using a 2-way, interactive audio and video technology.  Names of all persons participating in this telemedicine service and their role in this encounter. Name: [name]  Role: Pt's fiance  Name:  Role:   Name:  Role:   Name:  Role:     Cyndia Bent 08/01/2019 6:41 PM

## 2019-08-01 NOTE — ED Notes (Signed)
Belongings inventoried and in locker # 4, 1 bag, valuables with security.

## 2019-08-01 NOTE — ED Notes (Signed)
Pt belongings in locker #4. Pt valuables with security.

## 2019-08-01 NOTE — ED Notes (Signed)
The pt was ivcd by [employer] after she called the police and threatened to hurt someone in the house

## 2019-08-01 NOTE — Progress Notes (Signed)
[employer] CSW Progress Notes  Email from [name], RN CM w [facility] Dept working w patient and two of her children.  Pt states that fiance did not arrive w car this AM to take her to [facility] and care for children while she is at [facility].  Wonders if [facility] can provide transport if they can arrange child care.  Altered L Causey and [name] Dodd.  Appears that appts are being rescheduled.  Transportation coordinator aware and has contacted patient (had to leave VM for patient as she did not answer).  Rn CM is working w patient on determining next steps re finding care for children during her appts and assessing availability of support from partner during appointments.  [name], LCSW Clinical Social Worker Phone:  [phone]

## 2019-08-02 ENCOUNTER — Other Ambulatory Visit: Payer: Medicaid Other

## 2019-08-02 ENCOUNTER — Ambulatory Visit (HOSPITAL_BASED_OUTPATIENT_CLINIC_OR_DEPARTMENT_OTHER): Payer: Medicaid Other | Admitting: Oncology

## 2019-08-02 ENCOUNTER — Other Ambulatory Visit: Payer: Self-pay | Admitting: Behavioral Health

## 2019-08-02 ENCOUNTER — Other Ambulatory Visit: Payer: Self-pay

## 2019-08-02 ENCOUNTER — Inpatient Hospital Stay (HOSPITAL_BASED_OUTPATIENT_CLINIC_OR_DEPARTMENT_OTHER): Payer: Medicaid Other | Admitting: Medical

## 2019-08-02 ENCOUNTER — Telehealth: Payer: Self-pay | Admitting: *Deleted

## 2019-08-02 ENCOUNTER — Ambulatory Visit: Payer: Medicaid Other | Admitting: Adult Health

## 2019-08-02 ENCOUNTER — Inpatient Hospital Stay: Payer: Medicaid Other

## 2019-08-02 VITALS — BP 102/83 | HR 118 | Temp 97.8°F | Resp 18

## 2019-08-02 DIAGNOSIS — C50811 Malignant neoplasm of overlapping sites of right female breast: Secondary | ICD-10-CM | POA: Diagnosis not present

## 2019-08-02 DIAGNOSIS — Z5111 Encounter for antineoplastic chemotherapy: Secondary | ICD-10-CM | POA: Diagnosis not present

## 2019-08-02 DIAGNOSIS — R4589 Other symptoms and signs involving emotional state: Secondary | ICD-10-CM

## 2019-08-02 DIAGNOSIS — R110 Nausea: Secondary | ICD-10-CM

## 2019-08-02 DIAGNOSIS — Z171 Estrogen receptor negative status [ER-]: Secondary | ICD-10-CM | POA: Diagnosis not present

## 2019-08-02 DIAGNOSIS — R456 Violent behavior: Secondary | ICD-10-CM

## 2019-08-02 LAB — RAPID URINE DRUG SCREEN, HOSP PERFORMED
Amphetamines: NOT DETECTED
Barbiturates: NOT DETECTED
Benzodiazepines: NOT DETECTED
Cocaine: NOT DETECTED
Opiates: NOT DETECTED
Tetrahydrocannabinol: POSITIVE — AB

## 2019-08-02 MED ORDER — ZOLPIDEM TARTRATE 5 MG PO TABS: 5.0000 mg | ORAL_TABLET | Freq: Every evening | ORAL | Status: DC | PRN

## 2019-08-02 MED ORDER — SODIUM CHLORIDE 0.9 % IV SOLN
200.0000 mg | Freq: Once | INTRAVENOUS | Status: AC
  Administered 2019-08-02: 200 mg via INTRAVENOUS
  Filled 2019-08-02: qty 8

## 2019-08-02 MED ORDER — GABAPENTIN 300 MG PO CAPS
300.0000 mg | ORAL_CAPSULE | Freq: Three times a day (TID) | ORAL | Status: DC
  Administered 2019-08-02: 300 mg via ORAL
  Filled 2019-08-02: qty 1

## 2019-08-02 MED ORDER — DIPHENHYDRAMINE HCL 50 MG/ML IJ SOLN
INTRAMUSCULAR | Status: AC
  Filled 2019-08-02: qty 1

## 2019-08-02 MED ORDER — HEPARIN SOD (PORK) LOCK FLUSH 100 UNIT/ML IV SOLN
500.0000 [IU] | Freq: Once | INTRAVENOUS | Status: AC | PRN
  Administered 2019-08-02: 500 [IU]
  Filled 2019-08-02: qty 5

## 2019-08-02 MED ORDER — FAMOTIDINE IN NACL 20-0.9 MG/50ML-% IV SOLN
20.0000 mg | Freq: Once | INTRAVENOUS | Status: AC
  Administered 2019-08-02: 20 mg via INTRAVENOUS

## 2019-08-02 MED ORDER — ENALAPRIL MALEATE 10 MG PO TABS
10.0000 mg | ORAL_TABLET | Freq: Every day | ORAL | Status: DC
  Administered 2019-08-02: 10 mg via ORAL
  Filled 2019-08-02: qty 1

## 2019-08-02 MED ORDER — SODIUM CHLORIDE 0.9% FLUSH
10.0000 mL | INTRAVENOUS | Status: DC | PRN
  Administered 2019-08-02: 10 mL
  Filled 2019-08-02: qty 10

## 2019-08-02 MED ORDER — ALUM & MAG HYDROXIDE-SIMETH 200-200-20 MG/5ML PO SUSP: 30.0000 mL | Freq: Four times a day (QID) | ORAL | Status: DC | PRN

## 2019-08-02 MED ORDER — ONDANSETRON HCL 4 MG/2ML IJ SOLN
8.0000 mg | Freq: Once | INTRAMUSCULAR | Status: AC
  Administered 2019-08-02: 8 mg via INTRAVENOUS

## 2019-08-02 MED ORDER — NICOTINE 14 MG/24HR TD PT24
14.0000 mg | MEDICATED_PATCH | Freq: Every day | TRANSDERMAL | Status: DC
  Filled 2019-08-02: qty 1

## 2019-08-02 MED ORDER — CHOLESTYRAMINE 4 G PO PACK
4.0000 g | PACK | Freq: Three times a day (TID) | ORAL | Status: DC
  Administered 2019-08-02: 4 g via ORAL
  Filled 2019-08-02 (×2): qty 1

## 2019-08-02 MED ORDER — GOSERELIN ACETATE 3.6 MG ~~LOC~~ IMPL
3.6000 mg | DRUG_IMPLANT | Freq: Once | SUBCUTANEOUS | Status: AC
  Administered 2019-08-02: 3.6 mg via SUBCUTANEOUS

## 2019-08-02 MED ORDER — ACETAMINOPHEN 325 MG PO TABS: 650.0000 mg | ORAL_TABLET | ORAL | Status: DC | PRN

## 2019-08-02 MED ORDER — ALBUTEROL SULFATE HFA 108 (90 BASE) MCG/ACT IN AERS
2.0000 | INHALATION_SPRAY | Freq: Four times a day (QID) | RESPIRATORY_TRACT | Status: DC
  Administered 2019-08-02: 2 via RESPIRATORY_TRACT
  Filled 2019-08-02: qty 6.7

## 2019-08-02 MED ORDER — PAROXETINE HCL 10 MG PO TABS
10.0000 mg | ORAL_TABLET | Freq: Every day | ORAL | Status: DC
  Administered 2019-08-02: 10 mg via ORAL
  Filled 2019-08-02: qty 1

## 2019-08-02 MED ORDER — GOSERELIN ACETATE 3.6 MG ~~LOC~~ IMPL
DRUG_IMPLANT | SUBCUTANEOUS | Status: AC
  Filled 2019-08-02: qty 3.6

## 2019-08-02 MED ORDER — DIPHENHYDRAMINE HCL 50 MG/ML IJ SOLN
25.0000 mg | Freq: Once | INTRAMUSCULAR | Status: AC
  Administered 2019-08-02: 25 mg via INTRAVENOUS

## 2019-08-02 MED ORDER — ONDANSETRON HCL 4 MG PO TABS: 8.0000 mg | ORAL_TABLET | Freq: Three times a day (TID) | ORAL | Status: DC | PRN

## 2019-08-02 MED ORDER — ONDANSETRON HCL 4 MG/2ML IJ SOLN
INTRAMUSCULAR | Status: AC
  Filled 2019-08-02: qty 4

## 2019-08-02 MED ORDER — PRENATAL MULTIVITAMIN CH
1.0000 | ORAL_TABLET | Freq: Every day | ORAL | Status: DC
  Administered 2019-08-02: 1 via ORAL
  Filled 2019-08-02: qty 1

## 2019-08-02 MED ORDER — FAMOTIDINE IN NACL 20-0.9 MG/50ML-% IV SOLN
INTRAVENOUS | Status: AC
  Filled 2019-08-02: qty 50

## 2019-08-02 MED ORDER — SODIUM CHLORIDE 0.9 % IV SOLN
Freq: Once | INTRAVENOUS | Status: AC
  Administered 2019-08-02: 15:00:00 via INTRAVENOUS
  Filled 2019-08-02: qty 250

## 2019-08-02 MED ORDER — BUPRENORPHINE HCL-NALOXONE HCL 8-2 MG SL SUBL
1.0000 | SUBLINGUAL_TABLET | Freq: Every day | SUBLINGUAL | Status: DC
  Filled 2019-08-02: qty 1

## 2019-08-02 NOTE — Progress Notes (Signed)
Terri Estes had been scheduled to see me today after discharge from the hospital but was routed to see Terri Estes instead.  Please see his note.  She will receive her embolism Mab today.  I am going to add Benadryl and Pepcid although I do think she will develop a rash again.  Nevertheless I am hopeful the benefits from this immune treatment will greatly outweigh the temporary symptoms.  She will also receive her goserelin today

## 2019-08-02 NOTE — ED Notes (Signed)
The pt has a clear colored stone ring on her  Lt ring finger  The pt reports that the nurse up front that triaged her told her that she could keep her ring on ???

## 2019-08-02 NOTE — Progress Notes (Signed)
Okay to treat w/ ANC, ALT, and HR per Lucianne Lei. He has reviewed her labs from yesterday.   Demetrius Charity, PharmD, [facility] Pharmacist Pharmacy Phone: [phone] 08/02/2019

## 2019-08-02 NOTE — Progress Notes (Signed)
Per Sandi Mealy, PA okay for patient to receive treatment with pulse 115. Okay to treat with ALT 100.

## 2019-08-02 NOTE — ED Notes (Signed)
TTS in use at this time. 

## 2019-08-02 NOTE — ED Notes (Signed)
[facility] called to check on pt due to her being seen as a pt here due to her having had an appointment scheduled with them at 0900 for chemo. This RN was told to tell pt for her to give them a call when she is D/C.

## 2019-08-02 NOTE — Patient Instructions (Signed)
You may remove [facility] @ 6pm on Thursday, 07/26/2019.  Contact the [employer].  Crystal Herbin [phone] or Tonya [phone].  [facility] Discharge Instructions for Patients Receiving Chemotherapy  Today you received the following chemotherapy agents: Keytruda, Zoladex  To help prevent nausea and vomiting after your treatment, we encourage you to take your nausea medication as directed.   If you develop nausea and vomiting that is not controlled by your nausea medication, call the clinic.   BELOW ARE SYMPTOMS THAT SHOULD BE REPORTED IMMEDIATELY:  *FEVER GREATER THAN 100.5 F  *CHILLS WITH OR WITHOUT FEVER  NAUSEA AND VOMITING THAT IS NOT CONTROLLED WITH YOUR NAUSEA MEDICATION  *UNUSUAL SHORTNESS OF BREATH  *UNUSUAL BRUISING OR BLEEDING  TENDERNESS IN MOUTH AND THROAT WITH OR WITHOUT PRESENCE OF ULCERS  *URINARY PROBLEMS  *BOWEL PROBLEMS  UNUSUAL RASH Items with * indicate a potential emergency and should be followed up as soon as possible.  Feel free to call the clinic should you have any questions or concerns. The clinic phone number is [phone].  Please show the [facility] at check-in to the Emergency Department and triage nurse.

## 2019-08-02 NOTE — Telephone Encounter (Signed)
This RN received call from pt stating she has left the hospital and does she need to come in.  This RN returned call and inquired if pt has transportation for therapy this afternoon with [name] verifying she does.  Appointments made for MD visit and treatment ( per charge nurse ).  Pt had labs done in ER and these are sufficient for treatment today.

## 2019-08-02 NOTE — Discharge Instructions (Signed)
After speaking with the psychiatry team, they feel you are safe for discharge home today.  Please follow-up with your outpatient psychiatry team and your oncology team.  If any symptoms change or worsen, please return to the nearest emergency department.

## 2019-08-02 NOTE — ED Provider Notes (Signed)
8:58 AM Psychiatry calls report that patient is now psychiatrically clear and they spoke with both patient and her fianc and they all feel safe going home.  Patient will be discharged to go continue outpatient chemotherapy management.  Patient given instructions to follow-up with outpatient psychiatry and her oncology team.  Patient was feeling better and agreed with discharge plans.  IVC paperwork was rescinded and patient will be discharged to family.   Clinical Impression: 1. Homicidal ideation     Disposition: Discharge  Condition: Good  I have discussed the results, Dx and Tx plan with the pt(& family if present). He/she/they expressed understanding and agree(s) with the plan. Discharge instructions discussed at great length. Strict return precautions discussed and pt &/or family have verbalized understanding of the instructions. No further questions at time of discharge.    New Prescriptions   No medications on file    Follow Up: [facility] [address] [id number] mc [place]       Tegeler, Gwenyth Allegra, MD 08/02/19 [phone]

## 2019-08-02 NOTE — ED Notes (Signed)
Pt wanded by security. 

## 2019-08-02 NOTE — Progress Notes (Signed)
CSW sent therapy and psychiatry resources to patient's RN, Lowella Petties.   Areatha Keas. Judi Cong, MSW, [facility] Disposition Clinical Social Work [phone] (cell) [phone] (office)

## 2019-08-02 NOTE — Consult Note (Addendum)
Telepsych Consultation   Reason for Consult:  Having thoughts of wanting to, "fight" her fiance and aunt. Referring Physician:  EDP  Location of Patient: [facility] ED 027C Location of Provider: [facility]  Patient Identification: Terri Estes MRN:  [id number] Principal Diagnosis: <principal problem not specified> Diagnosis:  Active Problems:   * No active hospital problems. *   Total Time spent with patient: 15 minutes  Subjective:  " I just got overwhelmed and angry and wanted to fight my Aunt and fiance."   HPI:  Terri Estes is an 33 y.o. female. Pt has been diagnosed with stage 4 breast cancer and is currently receiving chemotherapy. Pt was brought to the ED by [employer]. Per Pt she contacted [employer] because she was angry at her fiance and aunt. The Pt said she told [employer] "I'm tired and can't take it any longer."  Per Pt her family is not taking her cancer diagnosis serious. Per Pt they caused her to miss her scheduled chemotherapy appointment today. The Pt states that she was angry and wanted to "fight them." Pt denies wanting to kill them. Pt denies SI and AVH. Pt denies previous SI attempt. 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 Pt states she is not angry at this time and would like outpatient therapy.  Collateral contact from Pt's fiance [name]. Per [name] he does not believe the Pt would harm him or others but he feels she may be a harm to herself.    Psychiatric consultation: This is a 33 y.o. female who presented to [facility] ED after contacting the [employer] stating she was angry and wanted to fight her fiance and Aunt. She denies ever making the statement that she wanted to kill them. She reports she just became overwhelmed with her currently living situation and they were upsetting her at the time so she made the comment. She has diagnoses of stage 4 breast cancer and is currently receiving chemotherapy. She  reports she is struggling in her current living situation as the apartment in which hs e is living was not willing to come out and make repairs and she complained so they have now gave her a 30 day eviction. Reports the [facility] is working with her to help her find housing. During this evaluation, she denies SI, HI or AVH.  She denies nay prior psychiatric admission although does report receiving treatment int he past for substance abuse. She reports she has been clean from substances for the past 3 years and she was still attending support groups up until the pandemic. She endorses that she would like to have resources for outpatient therapy as she has no therapist.   Collateral contact from Pt's fiance [name]: Mr. Ace Gins reports that patient just became overwhelmed yesterday because of their current living situation and he provides the same information as given by patient. He reports he does not believe that patient is a danger to herself or others. Reports that she has not attempted to harm herself. Reports she does have a strong support system.     Past Psychiatric History: Substance abuse   Risk to Self: Suicidal Ideation: No-Not Currently/Within Last 6 Months Suicidal Intent: No-Not Currently/Within Last 6 Months Is patient at risk for suicide?: Yes Suicidal Plan?: No-Not Currently/Within Last 6 Months Access to Means: No What has been your use of drugs/alcohol within the last 12 months?: NA How many times?: 0 Other Self  Harm Risks: NA Triggers for Past Attempts: None known Intentional Self Injurious Behavior: None Comment - Self Injurious Behavior: NA Risk to Others: Homicidal Ideation: No Thoughts of Harm to Others: No Current Homicidal Intent: No Current Homicidal Plan: No Access to Homicidal Means: No Identified Victim: NA History of harm to others?: No Assessment of Violence: None Noted Violent Behavior Description: NA Does patient have access to weapons?:  No Criminal Charges Pending?: No Does patient have a court date: No Prior Inpatient Therapy: Prior Inpatient Therapy: Yes Prior Therapy Dates: 2017 Prior Therapy Facilty/Provider(s): [facility] Reason for Treatment: SA Prior Outpatient Therapy: Prior Outpatient Therapy: No Does patient have an ACCT team?: No Does patient have Intensive In-House Services?  : No Does patient have [facility] services? : No Does patient have P4CC services?: No  Past Medical History:  Past Medical History:  Diagnosis Date  . Anemia    after pregnancy  . Anxiety    severe not taking meds  . Asthma    inhaler  4x per day  . Chronic pain   . Family history of breast cancer   . History of substance abuse ([place])   . Pregnancy induced hypertension    takes medication now.  . Psoriasis   . Trichomonas infection   . Vaginal Pap smear, abnormal     Past Surgical History:  Procedure Laterality Date  . CESAREAN SECTION  05/26/2008  . CESAREAN SECTION N/A 07/10/2017   Procedure: CESAREAN SECTION;  Surgeon: Florian Buff, MD;  Location: [facility];  Service: Obstetrics;  Laterality: N/A;  . CESAREAN SECTION N/A 09/23/2018   Procedure: CESAREAN SECTION;  Surgeon: Donnamae Jude, MD;  Location: [facility];  Service: Obstetrics;  Laterality: N/A;  . COLPOSCOPY    . PORTACATH PLACEMENT Left 04/30/2019   Procedure: INSERTION PORT-A-CATH WITH ULTRASOUND;  Surgeon: Alphonsa Overall, MD;  Location: [facility];  Service: General;  Laterality: Left;  Left Subclavian vein  . WISDOM TOOTH EXTRACTION     Family History:  Family History  Problem Relation Age of Onset  . Kidney disease Maternal Grandmother   . Breast cancer Maternal Grandmother 28       metastatic to liver; d. 56  . Kidney disease Maternal Grandfather   . Kidney disease Paternal Grandmother   . Diabetes Paternal Grandmother   . Breast cancer Paternal Grandmother   . Kidney disease Paternal Grandfather   . Breast cancer Paternal Aunt   .  Cerebral palsy Child   . Breast cancer Paternal Great-grandmother   . Pancreatic cancer Other        PGMs brother   . Throat cancer Paternal Uncle   . Breast cancer Other        PGMs sister   Family Psychiatric  History: None noted  Social History:  Social History   Substance and Sexual Activity  Alcohol Use No     Social History   Substance and Sexual Activity  Drug Use Yes  . Frequency: 7.0 times per week  . Types: Marijuana   Comment: subutex    Social History   Socioeconomic History  . Marital status: Significant Other    Spouse name: [name]   . Number of children: 3  . Years of education: Not on file  . Highest education level: Not on file  Occupational History  . Not on file  Social Needs  . Financial resource strain: Not on file  . Food insecurity    Worry: Not on file  Inability: Not on file  . Transportation needs    Medical: Not on file    Non-medical: Not on file  Tobacco Use  . Smoking status: Former Smoker    Packs/day: 0.50    Years: 5.00    Pack years: 2.50    Types: Cigarettes    Quit date: 04/10/2019    Years since quitting: 0.3  . Smokeless tobacco: Never Used  . Tobacco comment: trying to quit  Substance and Sexual Activity  . Alcohol use: No  . Drug use: Yes    Frequency: 7.0 times per week    Types: Marijuana    Comment: subutex  . Sexual activity: Yes    Birth control/protection: None    Comment: none  Lifestyle  . Physical activity    Days per week: Not on file    Minutes per session: Not on file  . Stress: Not on file  Relationships  . Social Herbalist on phone: Not on file    Gets together: Not on file    Attends religious service: Not on file    Active member of club or organization: Not on file    Attends meetings of clubs or organizations: Not on file    Relationship status: Not on file  Other Topics Concern  . Not on file  Social History Narrative  . Not on file   Additional Social History:     Allergies:  No Known Allergies  Labs:  Results for orders placed or performed during the hospital encounter of 08/01/19 (from the past 48 hour(s))  Comprehensive metabolic panel     Status: Abnormal   Collection Time: 08/01/19  5:23 PM  Result Value Ref Range   Sodium 134 (L) 135 - 145 mmol/L   Potassium 3.3 (L) 3.5 - 5.1 mmol/L   Chloride 96 (L) 98 - 111 mmol/L   CO2 27 22 - 32 mmol/L   Glucose, Bld 106 (H) 70 - 99 mg/dL   BUN 11 6 - 20 mg/dL   Creatinine, Ser 0.82 0.44 - 1.00 mg/dL   Calcium 9.4 8.9 - 10.3 mg/dL   Total Protein 6.8 6.5 - 8.1 g/dL   Albumin 3.8 3.5 - 5.0 g/dL   AST 37 15 - 41 U/L   ALT 100 (H) 0 - 44 U/L   Alkaline Phosphatase 107 38 - 126 U/L   Total Bilirubin 0.7 0.3 - 1.2 mg/dL   GFR calc non Af Amer >60 >60 mL/min   GFR calc Af Amer >60 >60 mL/min   Anion gap 11 5 - 15    Comment: Performed at [facility], [address], [place], [place] [address]  Ethanol     Status: None   Collection Time: 08/01/19  5:23 PM  Result Value Ref Range   Alcohol, Ethyl (B) <10 <10 mg/dL    Comment: (NOTE) Lowest detectable limit for serum alcohol is 10 mg/dL. For medical purposes only. Performed at [facility], [place] [address], [place], [place] [address]   Salicylate level     Status: None   Collection Time: 08/01/19  5:23 PM  Result Value Ref Range   Salicylate Lvl <7.3 2.8 - 30.0 mg/dL    Comment: Performed at [facility] [address] [place], [place] [address]  Acetaminophen level     Status: Abnormal   Collection Time: 08/01/19  5:23 PM  Result Value Ref Range   Acetaminophen (Tylenol), Serum <10 (L)  10 - 30 ug/mL    Comment: (NOTE) Therapeutic concentrations vary significantly. A range of 10-30 ug/mL  may be an effective concentration for many patients. However, some  are best treated at concentrations outside of this range. Acetaminophen concentrations >150 ug/mL at 4 hours after ingestion  and >50 ug/mL at 12 hours after ingestion  are often associated with  toxic reactions. Performed at [facility], [place] [address] [place], [place] [address]   cbc     Status: Abnormal   Collection Time: 08/01/19  5:23 PM  Result Value Ref Range   WBC 2.0 (L) 4.0 - 10.5 K/uL   RBC 3.47 (L) 3.87 - 5.11 MIL/uL   Hemoglobin 11.4 (L) 12.0 - 15.0 g/dL   HCT 33.7 (L) 36.0 - 46.0 %   MCV 97.1 80.0 - 100.0 fL   MCH 32.9 26.0 - 34.0 pg   MCHC 33.8 30.0 - 36.0 g/dL   RDW 17.2 (H) 11.5 - 15.5 %   Platelets 261 150 - 400 K/uL   nRBC 0.0 0.0 - 0.2 %    Comment: Performed at [facility] [address], [place], [place] [address]  Differential     Status: Abnormal   Collection Time: 08/01/19  5:23 PM  Result Value Ref Range   Neutrophils Relative % 50 %   Neutro Abs 1.0 (L) 1.7 - 7.7 K/uL   Lymphocytes Relative 43 %   Lymphs Abs 0.9 0.7 - 4.0 K/uL   Monocytes Relative 4 %   Monocytes Absolute 0.1 0.1 - 1.0 K/uL   Eosinophils Relative 1 %   Eosinophils Absolute 0.0 0.0 - 0.5 K/uL   Basophils Relative 1 %   Basophils Absolute 0.0 0.0 - 0.1 K/uL    Comment: Performed at [facility] [address], [place], [place] [address]  I-Stat beta hCG blood, ED     Status: None   Collection Time: 08/01/19  5:37 PM  Result Value Ref Range   I-stat hCG, quantitative <5.0 <5 mIU/mL   Comment 3            Comment:   GEST. AGE      CONC.  (mIU/mL)   <=1 WEEK        5 - 50     2 WEEKS       50 - 500     3 WEEKS       100 - 10,000     4 WEEKS     1,000 - 30,000        FEMALE AND NON-PREGNANT FEMALE:     LESS THAN 5 mIU/mL   Rapid urine drug screen (hospital performed)     Status: Abnormal   Collection Time: 08/02/19  8:18 AM  Result Value Ref Range   Opiates NONE DETECTED NONE DETECTED   Cocaine NONE DETECTED NONE DETECTED   Benzodiazepines NONE DETECTED NONE DETECTED   Amphetamines NONE DETECTED NONE DETECTED   Tetrahydrocannabinol POSITIVE (A) NONE DETECTED   Barbiturates NONE DETECTED NONE DETECTED    Comment:  (NOTE) DRUG SCREEN FOR MEDICAL PURPOSES ONLY.  IF CONFIRMATION IS NEEDED FOR ANY PURPOSE, NOTIFY LAB WITHIN 5 DAYS. LOWEST DETECTABLE LIMITS FOR URINE DRUG SCREEN Drug Class                     Cutoff (ng/mL) Amphetamine and metabolites    1000 Barbiturate and metabolites    200 Benzodiazepine  409 Tricyclics and metabolites     300 Opiates and metabolites        300 Cocaine and metabolites        300 THC                            50 Performed at [facility], [place] [address], [place], [place] [address]     Medications:  Current Facility-Administered Medications  Medication Dose Route Frequency Provider Last Rate Last Dose  . acetaminophen (TYLENOL) tablet 650 mg  650 mg Oral D9M PRN Delora Fuel, MD      . albuterol (VENTOLIN HFA) 108 (90 Base) MCG/ACT inhaler 2 puff  2 puff Inhalation QID Delora Fuel, MD      . alum & mag hydroxide-simeth (MAALOX/MYLANTA) 200-200-20 MG/5ML suspension 30 mL  30 mL Oral E2A PRN Delora Fuel, MD      . buprenorphine-naloxone (SUBOXONE) 8-2 mg per SL tablet 1 tablet  1 tablet Sublingual Daily Delora Fuel, MD      . cholestyramine [facility]) packet 4 g  4 g Oral TID WC Delora Fuel, MD   4 g at 08/02/19 0755  . enalapril (VASOTEC) tablet 10 mg  10 mg Oral Daily Delora Fuel, MD      . gabapentin (NEURONTIN) capsule 300 mg  834 mg Oral TID Delora Fuel, MD      . nicotine (NICODERM CQ - dosed in mg/24 hours) patch 14 mg  14 mg Transdermal Daily Delora Fuel, MD      . ondansetron [facility]) tablet 8 mg  8 mg Oral H9Q PRN Delora Fuel, MD      . PARoxetine (PAXIL) tablet 10 mg  10 mg Oral Daily Delora Fuel, MD      . prenatal multivitamin tablet 1 tablet  1 tablet Oral Daily Delora Fuel, MD      . zolpidem [facility]) tablet 5 mg  5 mg Oral QHS PRN Delora Fuel, MD       Current Outpatient Medications  Medication Sig Dispense Refill  . acyclovir ointment (ZOVIRAX) 5 % Apply 1 application topically 2 (two) times daily as needed.  Mix 1 part acyclovir cream with 1 part over the counter hydrocortisone 1% cream and apply to affected area two times a day as needed 5 g 0  . albuterol (PROVENTIL HFA;VENTOLIN HFA) 108 (90 Base) MCG/ACT inhaler Inhale 2 puffs into the lungs 4 (four) times daily. 1 Inhaler 2  . amoxicillin (AMOXIL) 500 MG tablet Take 1 tablet (500 mg total) by mouth 2 (two) times daily. 14 tablet 0  . buprenorphine (SUBUTEX) 8 MG SUBL SL tablet Place 8 mg under the tongue 2 (two) times daily.     . cholestyramine (QUESTRAN) 4 g packet Take 1 packet (4 g total) by mouth 3 (three) times daily with meals. 60 each 12  . cyclobenzaprine (FLEXERIL) 10 MG tablet Take 1 tablet (10 mg total) by mouth 3 (three) times daily as needed for muscle spasms. 90 tablet 2  . dexamethasone (DECADRON) 4 MG tablet TAKE 2 TABLETS BY MOUTH DAILY ON THE DAY AFTER CHEMOTHERAPY, AND THEN TAKE 2 TABLETS TWICE DAILY FOR 2 DAYS AFTERWARDS. TAKE WITH FOOD 30 tablet 1  . enalapril (VASOTEC) 10 MG tablet TAKE 1 TABLET BY MOUTH DAILY 90 tablet 0  . fluconazole (DIFLUCAN) 200 MG tablet Take 1 tablet (200 mg total) by mouth daily. 30 tablet 0  . gabapentin (NEURONTIN) 300 MG capsule  Take 1 capsule (300 mg total) by mouth 3 (three) times daily. 90 capsule 0  . hydrocortisone (ANUSOL-HC) 2.5 % rectal cream Place 1 application rectally 2 (two) times daily. 60 g 0  . omeprazole (PRILOSEC) 40 MG capsule Take 1 capsule (40 mg total) by mouth daily. 30 capsule 5  . ondansetron (ZOFRAN) 8 MG tablet TAKE 1 TABLET(8 MG) BY MOUTH EVERY 8 HOURS AS NEEDED FOR NAUSEA OR VOMITING 20 tablet 0  . PARoxetine (PAXIL) 10 MG tablet Take 1 tablet (10 mg total) by mouth daily. (Patient taking differently: Take 10 mg by mouth daily. Patient is taking 2 tablets daily per MD) 30 tablet 5  . predniSONE (DELTASONE) 5 MG tablet 6 tab x 1 day, 5 tab x 1 day, 4 tab x 1 day, 3 tab x 1 day, 2 tab x 1 day, 1 tab x 1 day, stop 21 tablet 0  . Prenatal MV-Min-FA-Omega-3 (PRENATAL  GUMMIES/DHA & FA) 0.4-32.5 MG CHEW Chew 2 each by mouth daily.    . prochlorperazine (COMPAZINE) 10 MG tablet TAKE 1 TABLET(10 MG) BY MOUTH EVERY 6 HOURS AS NEEDED FOR NAUSEA OR VOMITING 30 tablet 1  . valACYclovir (VALTREX) 1000 MG tablet Take 1 tablet (1,000 mg total) by mouth 2 (two) times daily. 60 tablet 0   Facility-Administered Medications Ordered in Other Encounters  Medication Dose Route Frequency Provider Last Rate Last Dose  . 0.9 % NaCl with KCl 20 mEq/ L  infusion   Intravenous Once Magrinat, [name], MD      . sodium chloride flush (NS) 0.9 % injection 10 mL  10 mL Intracatheter PRN Magrinat, [name], MD        Musculoskeletal: Unable to access as evaluation via telepsych   Psychiatric Specialty Exam: Physical Exam  Nursing note reviewed. Constitutional: She is oriented to person, place, and time.  Neurological: She is alert and oriented to person, place, and time.    Review of Systems  Psychiatric/Behavioral: Positive for depression. Negative for hallucinations, memory loss, substance abuse and suicidal ideas. The patient is nervous/anxious. The patient does not have insomnia.   All other systems reviewed and are negative.   Blood pressure 95/72, pulse (!) 108, temperature 98.4 F (36.9 C), temperature source Oral, resp. rate 18, SpO2 100 %, not currently breastfeeding.There is no height or weight on file to calculate BMI.  General Appearance: Casual  Eye Contact:  Good  Speech:  Clear and Coherent and Normal Rate  Volume:  Normal  Mood:  Anxious and Depressed  Affect:  Appropriate  Thought Process:  Coherent, Goal Directed, Linear and Descriptions of Associations: Intact  Orientation:  Full (Time, Place, and Person)  Thought Content:  WDL  Suicidal Thoughts:  No  Homicidal Thoughts:  No  Memory:  Immediate;   Fair Recent;   Fair  Judgement:  Fair  Insight:  Fair  Psychomotor Activity:  Normal  Concentration:  Concentration: Good and Attention Span: Good   Recall:  Good  Fund of Knowledge:  Good  Language:  Good  Akathisia:  Negative  Handed:  Right  AIMS (if indicated):     Assets:  Communication Skills Desire for Improvement Resilience Social Support  ADL's:  Intact  Cognition:  WNL  Sleep:        Treatment Plan Summary: Daily contact with patient to assess and evaluate symptoms and progress in treatment  Disposition: No evidence of imminent risk to self or others at present.   Patient does not meet  criteria for psychiatric inpatient admission. Resource sent for establishment of outpatient therapists.    Patient has been instructed that in the event of worsening symptoms, patient is instructed to call the crisis hotline, 911 and or go to the nearest ED for appropriate evaluation and treatment of symptoms. To follow-up with his/her primary care provider for your other medical issues, concerns and or health care needs...    Dr. Sherry Ruffing and Nurse Lowella Petties updated on current disposition   This service was provided via telemedicine using a 2-way, interactive audio and video technology.  Names of all persons participating in this telemedicine service and their role in this encounter. Name: Mordecai Maes  Role: FNP-C  Name: [name]  Role: Patient   Name: [name] Role: Iline Oven, NP 08/02/2019 8:54 AM

## 2019-08-03 ENCOUNTER — Encounter: Payer: Self-pay | Admitting: General Practice

## 2019-08-03 NOTE — Progress Notes (Signed)
[place] CSW Progress Notes  Phone call to patient.  Wanted to check on current status.  Per patient "I had a mental breakdown a couple of days ago."  "I am better now."  Michela Pitcher argument w aunt was trigger to outburst, was frustrated that aunt was not willing to accompany fiance and children to doctors appointments.  Court for eviction has been rescheduled to 8/10, Legal Aid lawyer [facility]) will represent patient in court on that day as patient has medical appointment.  Have located potential home, awaiting approval by landlord.  Is still awaiting decision on disability, has not heard from [facility] re her decision.  CSW spoke w Anderson Malta at [facility].  Claim was filed on July 9. Per [facility] records, patient called stating that Disability Determination Services needed verification that her cancer was Stage IV in order to expedite her claim.  Per Anderson Malta, this is most likely [employer] in [place] who needs this information.  CSW will continue to work w [employer] to ensure that the appropriate information is available to reviewers in order to expedite the claim.  Also discussed pending referral to [facility] for community support and outpatient therapy.  Patient has not connected w this provider to date, provided contact information again and urged patient to call.  Will continue to monitor.  Discussed need for additional outpatient support in order to allow patient to continue to achieve stability and life goals, patient aware that she needs additional help.    [name], LCSW Clinical Social Worker Phone:  [phone]
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
## 2019-08-06 ENCOUNTER — Inpatient Hospital Stay: Admission: RE | Admit: 2019-08-06 | Payer: Medicaid Other | Source: Ambulatory Visit

## 2019-08-07 ENCOUNTER — Encounter (HOSPITAL_COMMUNITY): Payer: Self-pay | Admitting: General Practice

## 2019-08-07 NOTE — Progress Notes (Signed)
[place] CSW Progress Notes  Call to patient to check on progress of referral to [facility] and to provide update on referral to Social Security disability.  No answer, left generic VM requesting call back.  [name], LCSW Clinical Social Worker Phone:  [phone]

## 2019-08-09 ENCOUNTER — Telehealth: Payer: Self-pay | Admitting: Oncology

## 2019-08-09 NOTE — Telephone Encounter (Signed)
Scheduled appt per 8/13 sch messag e- unable to reach pt . Left message with appt date and time

## 2019-08-10 ENCOUNTER — Telehealth: Payer: Self-pay | Admitting: *Deleted

## 2019-08-10 NOTE — Telephone Encounter (Signed)
Spoke with patient about her appointments.  Confirmed appointment for 8/19.  She states she is under a lot of stress and has not wanted to get her ultrasound that she has missed appointments because she is afraid of what it will show.  She states she just doesn't know if she can do anymore chemo.  I encouraged her to please keep the appointment with Dr. Jana Hakim on 8/19 to discuss the plan.  Informed her we would have to get some imaging even if she doesn't receive anymore chemo so we can assess her response.  Patient verbalized understanding and states she will be there 8/19.

## 2019-08-14 ENCOUNTER — Encounter: Payer: Self-pay | Admitting: General Practice

## 2019-08-14 NOTE — Progress Notes (Signed)
[place] CSW Progress Notes  Request from nurse navigator to talk w patient, called and left VM requesting retur call.  [name], LCSW Clinical Social Worker Phone:  [phone]

## 2019-08-15 ENCOUNTER — Encounter: Payer: Self-pay | Admitting: General Practice

## 2019-08-15 ENCOUNTER — Telehealth: Payer: Self-pay | Admitting: *Deleted

## 2019-08-15 ENCOUNTER — Inpatient Hospital Stay: Payer: Medicaid Other

## 2019-08-15 ENCOUNTER — Inpatient Hospital Stay: Payer: Medicaid Other | Admitting: General Practice

## 2019-08-15 ENCOUNTER — Other Ambulatory Visit: Payer: Self-pay

## 2019-08-15 ENCOUNTER — Inpatient Hospital Stay (HOSPITAL_BASED_OUTPATIENT_CLINIC_OR_DEPARTMENT_OTHER): Payer: Medicaid Other | Admitting: Oncology

## 2019-08-15 VITALS — BP 120/86 | HR 108 | Temp 97.5°F | Resp 18 | Ht 60.0 in | Wt 207.2 lb

## 2019-08-15 VITALS — HR 98

## 2019-08-15 DIAGNOSIS — C50811 Malignant neoplasm of overlapping sites of right female breast: Secondary | ICD-10-CM

## 2019-08-15 DIAGNOSIS — Z171 Estrogen receptor negative status [ER-]: Secondary | ICD-10-CM

## 2019-08-15 DIAGNOSIS — G620 Drug-induced polyneuropathy: Secondary | ICD-10-CM | POA: Diagnosis not present

## 2019-08-15 DIAGNOSIS — T451X5A Adverse effect of antineoplastic and immunosuppressive drugs, initial encounter: Secondary | ICD-10-CM

## 2019-08-15 DIAGNOSIS — Z5111 Encounter for antineoplastic chemotherapy: Secondary | ICD-10-CM | POA: Diagnosis not present

## 2019-08-15 LAB — COMPREHENSIVE METABOLIC PANEL
ALT: 100 U/L — ABNORMAL HIGH (ref 0–44)
AST: 80 U/L — ABNORMAL HIGH (ref 15–41)
Albumin: 3.2 g/dL — ABNORMAL LOW (ref 3.5–5.0)
Alkaline Phosphatase: 86 U/L (ref 38–126)
Anion gap: 8 (ref 5–15)
BUN: 5 mg/dL — ABNORMAL LOW (ref 6–20)
CO2: 26 mmol/L (ref 22–32)
Calcium: 8.6 mg/dL — ABNORMAL LOW (ref 8.9–10.3)
Chloride: 106 mmol/L (ref 98–111)
Creatinine, Ser: 0.74 mg/dL (ref 0.44–1.00)
GFR calc Af Amer: >60 mL/min (ref >60)
GFR calc non Af Amer: >60 mL/min (ref >60)
Glucose, Bld: 93 mg/dL (ref 70–99)
Potassium: 3.4 mmol/L — ABNORMAL LOW (ref 3.5–5.1)
Sodium: 140 mmol/L (ref 135–145)
Total Bilirubin: 0.3 mg/dL (ref 0.3–1.2)
Total Protein: 6 g/dL — ABNORMAL LOW (ref 6.5–8.1)

## 2019-08-15 LAB — CBC WITH DIFFERENTIAL/PLATELET
Abs Immature Granulocytes: 0.03 10*3/uL (ref 0.00–0.07)
Basophils Absolute: 0 10*3/uL (ref 0.0–0.1)
Basophils Relative: 0 %
Eosinophils Absolute: 0 10*3/uL (ref 0.0–0.5)
Eosinophils Relative: 1 %
HCT: 28 % — ABNORMAL LOW (ref 36.0–46.0)
Hemoglobin: 8.9 g/dL — ABNORMAL LOW (ref 12.0–15.0)
Immature Granulocytes: 1 %
Lymphocytes Relative: 14 %
Lymphs Abs: 0.8 10*3/uL (ref 0.7–4.0)
MCH: 32.7 pg (ref 26.0–34.0)
MCHC: 31.8 g/dL (ref 30.0–36.0)
MCV: 102.9 fL — ABNORMAL HIGH (ref 80.0–100.0)
Monocytes Absolute: 0.7 10*3/uL (ref 0.1–1.0)
Monocytes Relative: 11 %
Neutro Abs: 4.6 10*3/uL (ref 1.7–7.7)
Neutrophils Relative %: 73 %
Platelets: 296 10*3/uL (ref 150–400)
RBC: 2.72 MIL/uL — ABNORMAL LOW (ref 3.87–5.11)
RDW: 20.3 % — ABNORMAL HIGH (ref 11.5–15.5)
WBC: 6.2 10*3/uL (ref 4.0–10.5)
nRBC: 0.6 % — ABNORMAL HIGH (ref 0.0–0.2)

## 2019-08-15 MED ORDER — PALONOSETRON HCL INJECTION 0.25 MG/5ML
INTRAVENOUS | Status: AC
  Filled 2019-08-15: qty 5

## 2019-08-15 MED ORDER — SODIUM CHLORIDE 0.9 % IV SOLN
600.0000 mg/m2 | Freq: Once | INTRAVENOUS | Status: AC
  Administered 2019-08-15: 16:00:00 1200 mg via INTRAVENOUS
  Filled 2019-08-15: qty 60

## 2019-08-15 MED ORDER — SODIUM CHLORIDE 0.9% FLUSH
10.0000 mL | INTRAVENOUS | Status: DC | PRN
  Administered 2019-08-15: 10 mL
  Filled 2019-08-15: qty 10

## 2019-08-15 MED ORDER — SODIUM CHLORIDE 0.9 % IV SOLN
Freq: Once | INTRAVENOUS | Status: AC
  Administered 2019-08-15: 14:00:00 via INTRAVENOUS
  Filled 2019-08-15: qty 5

## 2019-08-15 MED ORDER — HEPARIN SOD (PORK) LOCK FLUSH 100 UNIT/ML IV SOLN
500.0000 [IU] | Freq: Once | INTRAVENOUS | Status: AC | PRN
  Administered 2019-08-15: 500 [IU]
  Filled 2019-08-15: qty 5

## 2019-08-15 MED ORDER — SODIUM CHLORIDE 0.9 % IV SOLN
Freq: Once | INTRAVENOUS | Status: AC
  Administered 2019-08-15: 14:00:00 via INTRAVENOUS
  Filled 2019-08-15: qty 250

## 2019-08-15 MED ORDER — PALONOSETRON HCL INJECTION 0.25 MG/5ML
0.2500 mg | Freq: Once | INTRAVENOUS | Status: AC
  Administered 2019-08-15: 14:00:00 0.25 mg via INTRAVENOUS

## 2019-08-15 MED ORDER — PEGFILGRASTIM 6 MG/0.6ML ~~LOC~~ PSKT
PREFILLED_SYRINGE | SUBCUTANEOUS | Status: AC
  Filled 2019-08-15: qty 0.6

## 2019-08-15 MED ORDER — DOXORUBICIN HCL CHEMO IV INJECTION 2 MG/ML
60.0000 mg/m2 | Freq: Once | INTRAVENOUS | Status: AC
  Administered 2019-08-15: 120 mg via INTRAVENOUS
  Filled 2019-08-15: qty 60

## 2019-08-15 MED ORDER — PROCHLORPERAZINE MALEATE 10 MG PO TABS: 10.0000 mg | ORAL_TABLET | Freq: Four times a day (QID) | ORAL | 0 refills | Status: DC | PRN

## 2019-08-15 MED ORDER — KETOCONAZOLE 2 % EX CREA: 1.0000 "application " | TOPICAL_CREAM | Freq: Every day | CUTANEOUS | 0 refills | Status: DC

## 2019-08-15 MED ORDER — PEGFILGRASTIM 6 MG/0.6ML ~~LOC~~ PSKT
6.0000 mg | PREFILLED_SYRINGE | Freq: Once | SUBCUTANEOUS | Status: AC
  Administered 2019-08-15: 6 mg via SUBCUTANEOUS

## 2019-08-15 MED FILL — KETOCONAZOLE 2% CREAM: 2 | 7 days supply | Qty: 15 | Fill #0

## 2019-08-15 MED FILL — PROCHLORPERAZINE 10 MG TAB: 10 | 8 days supply | Qty: 30 | Fill #0

## 2019-08-15 NOTE — Progress Notes (Signed)
[place] CSW Progress Notes  Met w patient in infusion to review progress/needs.  Her disability has been expedited and presumptively approved, funds released this week to her account.  The formal review by D[employer] will be prioritized.  Pt still waiting for infant daughter's disability to be determined, advised to call D[employer] and verify that they have received all records needed for this review.  Pt's eviction is being challenged w help from Legal Aid lawyer - she will need to appear in court next week and testify.  She and fiance are working on transitioning to another more habitable house - awaiting approval from landlord.  Patient has also connected w [facility] for community support services and mental health support - has not had formal intake process (was to occur today but could not be scheduled due to need for [facility] visit/infusion).  Overall, patient is weary, but making progress towards achieving goals of housing stability and mental health support in community.  CSW will continue to monitor/support.  [name], LCSW Clinical Social Worker Phone:  [phone]

## 2019-08-15 NOTE — Patient Instructions (Signed)
You may remove [place] @ 8:30pm on Thursday, 08/16/2019. [place] Discharge Instructions for Patients Receiving Chemotherapy  Today you received the following chemotherapy agents: Adriamycin, Cytoxan.  To help prevent nausea and vomiting after your treatment, we encourage you to take your nausea medication as directed.   If you develop nausea and vomiting that is not controlled by your nausea medication, call the clinic.   BELOW ARE SYMPTOMS THAT SHOULD BE REPORTED IMMEDIATELY:  *FEVER GREATER THAN 100.5 F  *CHILLS WITH OR WITHOUT FEVER  NAUSEA AND VOMITING THAT IS NOT CONTROLLED WITH YOUR NAUSEA MEDICATION  *UNUSUAL SHORTNESS OF BREATH  *UNUSUAL BRUISING OR BLEEDING  TENDERNESS IN MOUTH AND THROAT WITH OR WITHOUT PRESENCE OF ULCERS  *URINARY PROBLEMS  *BOWEL PROBLEMS  UNUSUAL RASH Items with * indicate a potential emergency and should be followed up as soon as possible.  Feel free to call the clinic should you have any questions or concerns. The clinic phone number is [phone].  Please show the [place] at check-in to the Emergency Department and triage nurse.

## 2019-08-15 NOTE — Progress Notes (Signed)
[place]  [phone] Fax:([phone]) [phone]    ID: Terri Estes DOB: 05-22-1986  MR#: [id number]  JFH#:[id number]  Patient Care Team: Patient, No Pcp Per as PCP - General (General Practice) Alphonsa Overall, MD as Consulting Physician (General Surgery) Antonina Deziel, [name], MD as Consulting Physician (Oncology) Kyung Rudd, MD as Consulting Physician (Radiation Oncology) Rockwell Germany, RN as Oncology Nurse Navigator Mauro Kaufmann, RN as Oncology Nurse Navigator Donnamae Jude, MD as Consulting Physician (Obstetrics and Gynecology) Pavelock, Ralene Bathe, MD as Consulting Physician (Internal Medicine) OTHER MD: Joylene Igo for [facility] is [phone]   CHIEF COMPLAINT: Triple negative breast cancer  CURRENT TREATMENT: neoadjuvant chemotherapy, pembrolizumab, goserelin   INTERVAL HISTORY: Terri Estes returns today for follow-up and treatment of her estrogen negative breast cancer. She was last seen on 08/02/2019.   She continues on neoadjuvant chemotherapy consisting of Doxorubicin and Cyclophspahmide given on day 1 of a 21 day cycle with onpro support. Today is day 1 cycle 3 of 4 cycles planned.  She received a third pembrolizumab dose on 08/02/2019.  We are repeating these every 21 days as tolerated.  She also receives goserelin every 28 days.  Her most recent dose was 08/02/2019.  Since her last visit here, she has not undergone any additional studies. Her last mammography was completed on 04/19/2019.   REVIEW OF SYSTEMS: Terri Estes worries that she may die.  This depresses her.  She does not exercise regularly but her aunt is taking her for walks late at night, when the children are asleep.  Terri Estes does not feel like eating big meals, but she is eating many little meals during the day.  Her weight is stable (209 pounds in June 207 pounds today).  She tells me that and our social worker is "taking good care of her".  She has itching under the breasts  bilaterally and a little bit along her back.  She is having trouble getting some of her medication and she tells me a nasty person at the [facility] refused to give her her nausea medicine.  She has not had any fever, bleeding, unusual headaches, visual changes, or falls.  There is been no change in bowel or bladder habits.  A detailed review of systems today was otherwise stable.   HISTORY OF CURRENT ILLNESS: From the original intake note:  Terri Estes presented with swelling along the left side of the face with neck pain. She then underwent a neck CT on 04/11/2019 showing: Enlarged left level 2 lymph nodes with associated inflammatory change including stranding of the adjacent fat and thickening of the platysma. While malignancy is not excluded, this is most concerning for infection. No focal abscess is present. Hypoattenuation within 1 of the left level 2 lymph nodes likely represents central necrosis or suppuration of the node. No primary malignancy or other abscess. Left upper lobe pulmonary nodules may be inflammatory. The largest measures 0.7 x 0.7 x 0.6 cm. CT of the chest with contrast may be useful for further evaluation.  She also underwent a chest CT on the same day showing: Complex right breast mass measuring 5.6 x 4.5 x 4.7 cm consistent with a primary breast carcinoma. Malignant right axillary adenopathy measuring up to 3.1 cm. Multiple bilateral pulmonary nodules are concerning for metastatic disease. Given the right breast mass, the left cervical lymph nodes are more likely malignant.  She then underwent bilateral diagnostic mammography with tomography and right breast ultrasonography at [facility] on 04/19/2019 showing: Breast  Density Category C; findings which are highly suspicious for multicentric inflammatory right breast cancer with right axillary nodal metastatic disease; no mammographic evidence of malignancy involving the left breast.  Accordingly on 04/19/2019 she  proceeded to biopsy of the right breast area in question. The pathology from this procedure showed ([id number]): invasive ductal carcinoma, grade III, upper inner quadrant, 12:30 o'clock, 5.0 cm from the nipple. Prognostic indicators significant for: estrogen receptor, 0% negative and progesterone receptor, 0% negative. Proliferation marker Ki67 at 90%. HER2 negative (0+).  Additional biopsies of the right breast and right axilla were performed on the same day. The pathology from this procedure showed ([id number]): 2. Breast, right, needle core biopsy, satellite mass UOQ, 10 o'clock, 8cmfn  - Invasive ductal carcinoma, grade III 3. Lymph node, needle/core biopsy, level 1 right axilla   - Invasive ductal carcinoma, grade III  The patient's subsequent history is as detailed below.   PAST MEDICAL HISTORY: Past Medical History:  Diagnosis Date   Anemia    after pregnancy   Anxiety    severe not taking meds   Asthma    inhaler  4x per day   Chronic pain    Family history of breast cancer    History of substance abuse ([place])    Pregnancy induced hypertension    takes medication now.   Psoriasis    Trichomonas infection    Vaginal Pap smear, abnormal      PAST SURGICAL HISTORY: Past Surgical History:  Procedure Laterality Date   CESAREAN SECTION  05/26/2008   CESAREAN SECTION N/A 07/10/2017   Procedure: CESAREAN SECTION;  Surgeon: Florian Buff, MD;  Location: [place];  Service: Obstetrics;  Laterality: N/A;   CESAREAN SECTION N/A 09/23/2018   Procedure: CESAREAN SECTION;  Surgeon: Donnamae Jude, MD;  Location: [place];  Service: Obstetrics;  Laterality: N/A;   COLPOSCOPY     PORTACATH PLACEMENT Left 04/30/2019   Procedure: INSERTION PORT-A-CATH WITH ULTRASOUND;  Surgeon: Alphonsa Overall, MD;  Location: [facility];  Service: General;  Laterality: Left;  Left Subclavian vein   WISDOM TOOTH EXTRACTION       FAMILY HISTORY: Family History  Problem  Relation Age of Onset   Kidney disease Maternal Grandmother    Breast cancer Maternal Grandmother 28       metastatic to liver; d. 32   Kidney disease Maternal Grandfather    Kidney disease Paternal Grandmother    Diabetes Paternal Grandmother    Breast cancer Paternal Grandmother    Kidney disease Paternal Grandfather    Breast cancer Paternal Aunt    Cerebral palsy Child    Breast cancer Paternal Great-grandmother    Pancreatic cancer Other        PGMs brother    Throat cancer Paternal Uncle    Breast cancer Other        PGMs sister  (Updated 04/25/2019) Jory's father is living at age 47. Patients' mother is also living at age 44. [place] notes, however, that she was raised by her grandmother.) The patient has 0 brothers and 5 sisters. Patient denies anyone in her family having ovarian, prostate, or pancreatic cancer. Her maternal grandmother was diagnosed with breast cancer, unsure what age. On her father's side, she reports her grandmother had cysts in her breasts, her great-aunt might have had breast cancer, and her great-grandmother was diagnosed with breast cancer at an older age.   GYNECOLOGIC HISTORY:  No LMP recorded. (Menstrual status: Chemotherapy).  She had an  emergent C-section in 09/23/2018 for abruptio placenta at 30 weeks pregnancy, also has a history of eclampsia Menarche:    years old Age at first live birth: 32 years old [place] P: 3 LMP: 04/18/2019 Contraceptive: none HRT: none  Hysterectomy?: no BSO?: no   SOCIAL HISTORY: (Current as of August 2020) Terri Estes is currently unemployed. She previously worked at [employer]. She is currently engaged. Fiance [name] runs a Midwife and works other odd jobs. Daughter Demetrius Charity, age 16, was abused by her father at 70 months and has cerebral palsy. Son [name], age 69, has severe asthma and is developmentally disabled. Daughter Yetta Glassman was born on 09/23/2018 prematurely and remains on oxygen at  home.  The patient's aunt is currently staying with her and is helping with the children and providing general support   ADVANCED DIRECTIVES: not in place.    HEALTH MAINTENANCE: Social History   Tobacco Use   Smoking status: Former Smoker    Packs/day: 0.50    Years: 5.00    Pack years: 2.50    Types: Cigarettes    Quit date: 04/10/2019    Years since quitting: 0.3   Smokeless tobacco: Never Used   Tobacco comment: trying to quit  Substance Use Topics   Alcohol use: No   Drug use: Yes    Frequency: 7.0 times per week    Types: Marijuana    Comment: subutex    Colonoscopy: never done  PAP: 05/10/2018  Bone density: never done Mammography: first performed following abnormal CT  No Known Allergies   OBJECTIVE: Young African-American woman who was tearful during today's visit  There were no vitals filed for this visit.   There is no height or weight on file to calculate BMI.   Wt Readings from Last 3 Encounters:  07/25/19 201 lb 14.4 oz (91.6 kg)  07/20/19 203 lb 3.2 oz (92.2 kg)  07/18/19 200 lb (90.7 kg)  ECOG FS:1 - Symptomatic but completely ambulatory  Sclerae unicteric, EOMs intact Wearing a mask No cervical or supraclavicular adenopathy Lungs no rales or rhonchi Heart regular rate and rhythm Abd soft, nontender, positive bowel sounds MSK no focal spinal tenderness, no upper extremity lymphedema Neuro: nonfocal, well oriented, appropriate affect Breasts: The right breast is imaged below.  It is considerably smaller and softer than when she first presented.  The "eczema" has dried up.  This is imaged below.  Right breast 08/15/2019       LAB RESULTS:  CMP     Component Value Date/Time   NA 134 (L) 08/01/2019 1723   NA 137 05/10/2018 1535   K 3.3 (L) 08/01/2019 1723   CL 96 (L) 08/01/2019 1723   CO2 27 08/01/2019 1723   GLUCOSE 106 (H) 08/01/2019 1723   BUN 11 08/01/2019 1723   BUN 7 05/10/2018 1535   CREATININE 0.82 08/01/2019 1723    CREATININE 0.81 07/03/2019 0919   CALCIUM 9.4 08/01/2019 1723   PROT 6.8 08/01/2019 1723   PROT 7.2 05/10/2018 1535   ALBUMIN 3.8 08/01/2019 1723   ALBUMIN 4.2 05/10/2018 1535   AST 37 08/01/2019 1723   AST 54 (H) 07/03/2019 0919   ALT 100 (H) 08/01/2019 1723   ALT 102 (H) 07/03/2019 0919   ALKPHOS 107 08/01/2019 1723   BILITOT 0.7 08/01/2019 1723   BILITOT <0.2 (L) 07/03/2019 0919   GFRNONAA >60 08/01/2019 1723   GFRNONAA >60 07/03/2019 0919   GFRAA >60 08/01/2019 1723   GFRAA >60 07/03/2019 7209  No results found for: TOTALPROTELP, ALBUMINELP, A1GS, A2GS, BETS, BETA2SER, GAMS, MSPIKE, SPEI  No results found for: KPAFRELGTCHN, LAMBDASER, KAPLAMBRATIO  Lab Results  Component Value Date   WBC 6.2 08/15/2019   NEUTROABS 4.6 08/15/2019   HGB 8.9 (L) 08/15/2019   HCT 28.0 (L) 08/15/2019   MCV 102.9 (H) 08/15/2019   PLT 296 08/15/2019    _0 @  No results found for: LABCA2  No components found for: ERDEYC144  No results for input(s): INR in the last 168 hours.  No results found for: LABCA2  No results found for: YJE563  No results found for: JSH702  No results found for: OVZ858  No results found for: CA2729  No components found for: HGQUANT  No results found for: CEA1 / No results found for: CEA1   No results found for: AFPTUMOR  No results found for: CHROMOGRNA  No results found for: PSA1  Appointment on 08/15/2019  Component Date Value Ref Range Status   WBC 08/15/2019 6.2  4.0 - 10.5 K/uL Final   RBC 08/15/2019 2.72* 3.87 - 5.11 MIL/uL Final   Hemoglobin 08/15/2019 8.9* 12.0 - 15.0 g/dL Final   HCT 08/15/2019 28.0* 36.0 - 46.0 % Final   MCV 08/15/2019 102.9* 80.0 - 100.0 fL Final   MCH 08/15/2019 32.7  26.0 - 34.0 pg Final   MCHC 08/15/2019 31.8  30.0 - 36.0 g/dL Final   RDW 08/15/2019 20.3* 11.5 - 15.5 % Final   Platelets 08/15/2019 296  150 - 400 K/uL Final   nRBC 08/15/2019 0.6* 0.0 - 0.2 % Final   Neutrophils Relative %  08/15/2019 73  % Final   Neutro Abs 08/15/2019 4.6  1.7 - 7.7 K/uL Final   Lymphocytes Relative 08/15/2019 14  % Final   Lymphs Abs 08/15/2019 0.8  0.7 - 4.0 K/uL Final   Monocytes Relative 08/15/2019 11  % Final   Monocytes Absolute 08/15/2019 0.7  0.1 - 1.0 K/uL Final   Eosinophils Relative 08/15/2019 1  % Final   Eosinophils Absolute 08/15/2019 0.0  0.0 - 0.5 K/uL Final   Basophils Relative 08/15/2019 0  % Final   Basophils Absolute 08/15/2019 0.0  0.0 - 0.1 K/uL Final   Immature Granulocytes 08/15/2019 1  % Final   Abs Immature Granulocytes 08/15/2019 0.03  0.00 - 0.07 K/uL Final   Performed at [facility], [address] [place], [place] [address]    (this displays the last labs from the last 3 days)  No results found for: TOTALPROTELP, ALBUMINELP, A1GS, A2GS, BETS, BETA2SER, GAMS, MSPIKE, SPEI (this displays SPEP labs)  No results found for: KPAFRELGTCHN, LAMBDASER, KAPLAMBRATIO (kappa/lambda light chains)  Lab Results  Component Value Date   HGBA 97.4 12/16/2016   (Hemoglobinopathy evaluation)   No results found for: LDH  No results found for: IRON, TIBC, IRONPCTSAT (Iron and TIBC)  No results found for: FERRITIN  Urinalysis    Component Value Date/Time   COLORURINE YELLOW 06/26/2019 1300   APPEARANCEUR HAZY (A) 06/26/2019 1300   LABSPEC 1.019 06/26/2019 1300   PHURINE 6.0 06/26/2019 1300   GLUCOSEU NEGATIVE 06/26/2019 1300   HGBUR NEGATIVE 06/26/2019 1300   BILIRUBINUR NEGATIVE 06/26/2019 1300   KETONESUR NEGATIVE 06/26/2019 1300   PROTEINUR NEGATIVE 06/26/2019 1300   UROBILINOGEN 4.0 (H) 01/13/2018 1417   NITRITE NEGATIVE 06/26/2019 1300   LEUKOCYTESUR LARGE (A) 06/26/2019 1300     STUDIES:  No results found.   ELIGIBLE FOR AVAILABLE RESEARCH PROTOCOL: no   ASSESSMENT:  33 y.o. [place] woman status post right breast biopsy x2 and right axillary lymph node biopsy 04/19/2019 for a clinical T4 N2 MX, stage IIIC  invasive ductal carcinoma, grade 3, triple negative, with an MIB-1 of 90%  (a) chest CT scan 04/11/2019 shows multiple subcentimeter lung nodules  (b) PET scan denied by insurance  (c) CT A/P and bone scan due 05/09/2019 show a very small low-density lesion in the lateral segment of the left liver which is indeterminate, and sclerosis of the right SI joint, which is felt to be likely degenerative (negative by bone scan)..  (c) baseline breast MRI 04/30/2019 shows involvement of all 4 quadrants in the right breast as well as right axillary subpectoral and internal mammary lymphadenopathy.  Left breast was unremarkable  (1) neoadjuvant chemotherapy consisting of  (a) pembrolizumab started 05/03/2019, third dose delayed because of elevated liver function tests, resumed 08/02/2019  (b) paclitaxel weekly x12 with carboplatin every 21 days x 4, started 05/08/2019   (i) tolerated carboplatin AUC 5-day 1 poorly, switched to AUC 2 on day 1 day 8   (ii) cycle 2 delayed 1 week because of elevated liver function tests   (iii) carboplatin and paclitaxel discontinued after 5 doses because of neuropathy, (received 7 weekly doses paclitaxel and one AUC5 carbo dose, four AUC2 doses)  (c) cyclophosphamide and doxorubicin x4 started 07/03/2019   (i) 2d dose given day 28 due to poor tolerance; other doses Q 21 days   (2) definitive surgery pending  (3) adjuvant radiation to follow  (4) genetics testing  (a) genetic testing 05/14/2019 through the Common Hereditary Gene Panel offered by [facility] found no deleterious mutations in APC, ATM, AXIN2, BARD1, BMPR1A, BRCA1, BRCA2, BRIP1, CDH1, CDK4, CDKN2A (p14ARF), CDKN2A (p16INK4a), CHEK2, CTNNA1, DICER1, EPCAM (Deletion/duplication testing only), GREM1 (promoter region deletion/duplication testing only), KIT, MEN1, MLH1, MSH2, MSH3, MSH6, MUTYH, NBN, NF1, NHTL1, PALB2, PDGFRA, PMS2, POLD1, POLE, PTEN, RAD50, RAD51C, RAD51D, RNF43, SDHB, SDHC, SDHD, SMAD4, SMARCA4. STK11,  TP53, TSC1, TSC2, and VHL.  The following genes were evaluated for sequence changes only: SDHA and HOXB13 c.251G>A variant only  (5) goserelin started 05/08/2019  (6) pembrolizumab to be continued and minimum of 1 year   PLAN: Terri Estes was tearful in the earlier part of our interview today because she thought she was going to die but really I think she is doing considerably better.  I was impressed with how the breast looked today and I think she is going to be able to have a successful surgery within a couple of months.  She is proceeding to cycle 3 of 4 planned.  She has 1 final chemotherapy cycle in 3 weeks after which she will have a breast MRI and see her surgeon.  I have gone ahead and entered the MRI order and also have entered orders for her to receive the goserelin every 4 weeks and the [facility] every 3weeks continuing beyond the chemotherapy.  We are going to see her weekly for the next 3 weeks just to make sure everything stays on schedule.  I wrote for Compazine for nausea and ketoconazole cream today for her itching and put it through our [facility] long pharmacist and she is having trouble with her usual pharmacy  She knows to call for any other issue that may develop before the next visit.  I spent approximately 30 minutes face to face with Terri Estes with more than 50% of that time spent in counseling and coordination of care.     Jemila Camille, Sarajane Jews  C, MD  08/15/19 12:52 PM Medical Oncology and Hematology [facility] [address] [place], [place] [address] Tel. [phone]    Fax. [phone]   I, Wilburn Mylar, am acting as scribe for Dr. [name]. [place] Spees.  I, Lurline Del MD, have reviewed the above documentation for accuracy and completeness, and I agree with the above.

## 2019-08-15 NOTE — Telephone Encounter (Signed)
Per MD review of elevated liver enzymes AST and ALT - OK to proceed with treatment order given.

## 2019-08-15 NOTE — Patient Instructions (Signed)

## 2019-08-17 ENCOUNTER — Telehealth: Payer: Self-pay | Admitting: Oncology

## 2019-08-17 NOTE — Telephone Encounter (Signed)
I left a message regarding schedule for 8/26-10/7

## 2019-08-22 ENCOUNTER — Telehealth: Payer: Self-pay | Admitting: *Deleted

## 2019-08-22 ENCOUNTER — Other Ambulatory Visit: Payer: Self-pay | Admitting: Physician Assistant

## 2019-08-22 ENCOUNTER — Inpatient Hospital Stay: Payer: Medicaid Other

## 2019-08-22 ENCOUNTER — Inpatient Hospital Stay (HOSPITAL_BASED_OUTPATIENT_CLINIC_OR_DEPARTMENT_OTHER): Payer: Medicaid Other | Admitting: Physician Assistant

## 2019-08-22 ENCOUNTER — Other Ambulatory Visit: Payer: Self-pay

## 2019-08-22 VITALS — BP 94/76 | HR 122 | Temp 98.0°F | Resp 18 | Ht 60.0 in | Wt 195.2 lb

## 2019-08-22 DIAGNOSIS — R634 Abnormal weight loss: Secondary | ICD-10-CM | POA: Diagnosis not present

## 2019-08-22 DIAGNOSIS — D709 Neutropenia, unspecified: Secondary | ICD-10-CM

## 2019-08-22 DIAGNOSIS — E860 Dehydration: Secondary | ICD-10-CM | POA: Diagnosis not present

## 2019-08-22 DIAGNOSIS — Z5112 Encounter for antineoplastic immunotherapy: Secondary | ICD-10-CM

## 2019-08-22 DIAGNOSIS — J029 Acute pharyngitis, unspecified: Secondary | ICD-10-CM

## 2019-08-22 DIAGNOSIS — C50811 Malignant neoplasm of overlapping sites of right female breast: Secondary | ICD-10-CM | POA: Diagnosis not present

## 2019-08-22 DIAGNOSIS — Z5111 Encounter for antineoplastic chemotherapy: Secondary | ICD-10-CM | POA: Diagnosis not present

## 2019-08-22 DIAGNOSIS — Z171 Estrogen receptor negative status [ER-]: Secondary | ICD-10-CM

## 2019-08-22 DIAGNOSIS — Z95828 Presence of other vascular implants and grafts: Secondary | ICD-10-CM

## 2019-08-22 LAB — CBC WITH DIFFERENTIAL/PLATELET
Abs Immature Granulocytes: 0.1 10*3/uL — ABNORMAL HIGH (ref 0.00–0.07)
Basophils Absolute: 0 10*3/uL (ref 0.0–0.1)
Basophils Relative: 2 %
Eosinophils Absolute: 0 10*3/uL (ref 0.0–0.5)
Eosinophils Relative: 2 %
HCT: 28.2 % — ABNORMAL LOW (ref 36.0–46.0)
Hemoglobin: 9.4 g/dL — ABNORMAL LOW (ref 12.0–15.0)
Immature Granulocytes: 16 %
Lymphocytes Relative: 53 %
Lymphs Abs: 0.4 10*3/uL — ABNORMAL LOW (ref 0.7–4.0)
MCH: 33 pg (ref 26.0–34.0)
MCHC: 33.3 g/dL (ref 30.0–36.0)
MCV: 98.9 fL (ref 80.0–100.0)
Monocytes Absolute: 0 10*3/uL — ABNORMAL LOW (ref 0.1–1.0)
Monocytes Relative: 5 %
Neutro Abs: 0.1 10*3/uL — CL (ref 1.7–7.7)
Neutrophils Relative %: 22 %
Platelets: 163 10*3/uL (ref 150–400)
RBC: 2.85 MIL/uL — ABNORMAL LOW (ref 3.87–5.11)
RDW: 17 % — ABNORMAL HIGH (ref 11.5–15.5)
WBC: 0.6 10*3/uL — CL (ref 4.0–10.5)
nRBC: 0 % (ref 0.0–0.2)

## 2019-08-22 LAB — COMPREHENSIVE METABOLIC PANEL
ALT: 18 U/L (ref 0–44)
AST: 10 U/L — ABNORMAL LOW (ref 15–41)
Albumin: 4 g/dL (ref 3.5–5.0)
Alkaline Phosphatase: 102 U/L (ref 38–126)
Anion gap: 11 (ref 5–15)
BUN: 12 mg/dL (ref 6–20)
CO2: 25 mmol/L (ref 22–32)
Calcium: 9.7 mg/dL (ref 8.9–10.3)
Chloride: 102 mmol/L (ref 98–111)
Creatinine, Ser: 0.73 mg/dL (ref 0.44–1.00)
GFR calc Af Amer: >60 mL/min (ref >60)
GFR calc non Af Amer: >60 mL/min (ref >60)
Glucose, Bld: 96 mg/dL (ref 70–99)
Potassium: 3.9 mmol/L (ref 3.5–5.1)
Sodium: 138 mmol/L (ref 135–145)
Total Bilirubin: 0.7 mg/dL (ref 0.3–1.2)
Total Protein: 7.4 g/dL (ref 6.5–8.1)

## 2019-08-22 MED ORDER — SODIUM CHLORIDE 0.9 % IV SOLN
Freq: Once | INTRAVENOUS | Status: AC
  Administered 2019-08-22: 15:00:00 via INTRAVENOUS
  Filled 2019-08-22: qty 250

## 2019-08-22 MED ORDER — TBO-FILGRASTIM 480 MCG/0.8ML ~~LOC~~ SOSY
PREFILLED_SYRINGE | SUBCUTANEOUS | Status: AC
  Filled 2019-08-22: qty 0.8

## 2019-08-22 MED ORDER — SODIUM CHLORIDE 0.9% FLUSH
10.0000 mL | INTRAVENOUS | Status: DC | PRN
  Administered 2019-08-22: 10 mL
  Filled 2019-08-22: qty 10

## 2019-08-22 MED ORDER — FAMOTIDINE IN NACL 20-0.9 MG/50ML-% IV SOLN
20.0000 mg | Freq: Once | INTRAVENOUS | Status: AC
  Administered 2019-08-22: 20 mg via INTRAVENOUS

## 2019-08-22 MED ORDER — PROCHLORPERAZINE EDISYLATE 10 MG/2ML IJ SOLN
INTRAMUSCULAR | Status: AC
  Filled 2019-08-22: qty 2

## 2019-08-22 MED ORDER — SODIUM CHLORIDE 0.9 % IV SOLN
Freq: Once | INTRAVENOUS | Status: AC
  Filled 2019-08-22: qty 250

## 2019-08-22 MED ORDER — SODIUM CHLORIDE 0.9% FLUSH
10.0000 mL | INTRAVENOUS | Status: DC | PRN
  Administered 2019-08-22: 10 mL via INTRAVENOUS
  Filled 2019-08-22: qty 10

## 2019-08-22 MED ORDER — CIPROFLOXACIN HCL 500 MG PO TABS: 500.0000 mg | ORAL_TABLET | Freq: Two times a day (BID) | ORAL | 0 refills | Status: DC

## 2019-08-22 MED ORDER — TBO-FILGRASTIM 480 MCG/0.8ML ~~LOC~~ SOSY
480.0000 ug | PREFILLED_SYRINGE | Freq: Once | SUBCUTANEOUS | Status: AC
  Administered 2019-08-22: 480 ug via SUBCUTANEOUS

## 2019-08-22 MED ORDER — PROCHLORPERAZINE EDISYLATE 10 MG/2ML IJ SOLN
10.0000 mg | Freq: Once | INTRAMUSCULAR | Status: AC
  Administered 2019-08-22: 16:00:00 10 mg via INTRAVENOUS
  Filled 2019-08-22: qty 2

## 2019-08-22 MED ORDER — FAMOTIDINE IN NACL 20-0.9 MG/50ML-% IV SOLN
INTRAVENOUS | Status: AC
  Filled 2019-08-22: qty 50

## 2019-08-22 MED ORDER — DIPHENHYDRAMINE HCL 50 MG/ML IJ SOLN
25.0000 mg | Freq: Once | INTRAMUSCULAR | Status: AC
  Administered 2019-08-22: 25 mg via INTRAVENOUS

## 2019-08-22 MED ORDER — SODIUM CHLORIDE 0.9 % IV SOLN
200.0000 mg | Freq: Once | INTRAVENOUS | Status: AC
  Administered 2019-08-22: 200 mg via INTRAVENOUS
  Filled 2019-08-22: qty 8

## 2019-08-22 MED ORDER — HEPARIN SOD (PORK) LOCK FLUSH 100 UNIT/ML IV SOLN
500.0000 [IU] | Freq: Once | INTRAVENOUS | Status: AC | PRN
  Administered 2019-08-22: 18:00:00 500 [IU]
  Filled 2019-08-22: qty 5

## 2019-08-22 MED ORDER — DIPHENHYDRAMINE HCL 50 MG/ML IJ SOLN
INTRAMUSCULAR | Status: AC
  Filled 2019-08-22: qty 1

## 2019-08-22 NOTE — Progress Notes (Signed)
[place]  Telephone:(336) [phone] Fax:(336) [phone]    ID: Terri Estes DOB: December 26, 1986  MR#: [id number]  VQQ#:[id number]  Patient Care Team: Patient, No Pcp Per as PCP - General (General Practice) Terri Overall, MD as Consulting Physician (General Surgery) Estes, Terri Dad, MD as Consulting Physician (Oncology) Terri Rudd, MD as Consulting Physician (Radiation Oncology) Terri Germany, RN as Oncology Nurse Navigator Terri Kaufmann, RN as Oncology Nurse Navigator Terri Jude, MD as Consulting Physician (Obstetrics and Gynecology) Estes, Terri Bathe, MD as Consulting Physician (Internal Medicine) OTHER MD: Terri Estes for [facility] is [phone]   CHIEF COMPLAINT: Triple negative breast cancer  CURRENT TREATMENT: neoadjuvant chemotherapy,pembrolizumab, goserelin  INTERVAL HISTORY: Terri Estes returns today for follow-up and treatment of her estrogen negative breast cancer. She was last seen on 08/15/2019.   She continues on neoadjuvant chemotherapy consisting of Doxorubicin and Cyclophspahmide given on day 1 of a 21 day cycle with onpro support. She is status post 3 of 4 cycles. Day 1 of Cycle 4 is scheduled for 08/29/2019   She received a third pembrolizumab dose on 08/02/2019.  We are repeating these every 21 days as tolerated. She is due for her 4th pembrolizumab dose today.   She also receives goserelin every 28 days.  Her most recent dose was 08/02/2019.  Since her last visit here, she has not undergone any additional studies. Her last mammography was completed on 04/19/2019.   REVIEW OF SYSTEMS: The patient is feeling fair today but she endorses some lightheadedness, particularly with standing/ambulation. She has lost a significant amount of weight since her last visit. The patient states that she is not eating or drinking as much as she should be due to alterations in taste since being on chemotherapy and having a diminished appetite.     She also has been feeling anxious and depressed lately regarding her breast cancer diagnosis. She also has some social challenges. She is followed closely by social work. Her social worker has arranged for the patient to meet with a counselor regarding her depression.   The patient received cycle #3 of her chemotherapy last week. She tolerated it well except for mild nausea which is well controlled with compazine. She believes her Onpro may have malfunctioned. She stated "it don't never work" and that when she took it off there was some liquid leaking from it. She denies any fevers, chills, cough, shortness of breath, dysuria, nasal congestions. When asked, she states her throat was "a little irritated" yesterday when she was "sucking on a straw". She attributes it to not taking her Claritin. She denies any oral lesions or sick contacts. She was recently treated for an ear infection a few weeks ago.   The patient noted that for several weeks she has had some itching under her right breast. She started using OTC powder which she noted has improved her symptoms.  All other review of systems otherwise negative.  She is here today for evaluation before starting her 4th dose of pembrolizumab.      HISTORY OF CURRENT ILLNESS: From the original intake note:  Terri Estes presented with swelling along the left side of the face with neck pain. She then underwent a neck CT on 04/11/2019 showing: Enlarged left level 2 lymph nodes with associated inflammatory change including stranding of the adjacent fat and thickening of the platysma. While malignancy is not excluded, this is most concerning for infection. No focal abscess is present. Hypoattenuation within 1 of the left level  2 lymph nodes likely represents central necrosis or suppuration of the node. No primary malignancy or other abscess. Left upper lobe pulmonary nodules may be inflammatory. The largest measures 0.7 x 0.7 x 0.6 cm. CT of the chest  with contrast may be useful for further evaluation.  She also underwent a chest CT on the same day showing: Complex right breast mass measuring 5.6 x 4.5 x 4.7 cm consistent with a primary breast carcinoma. Malignant right axillary adenopathy measuring up to 3.1 cm. Multiple bilateral pulmonary nodules are concerning for metastatic disease. Given the right breast mass, the left cervical lymph nodes are more likely malignant.  She then underwent bilateral diagnostic mammography with tomography and right breast ultrasonography at [facility] on 04/19/2019 showing: Breast Density Category C; findings which are highly suspicious for multicentric inflammatory right breast cancer with right axillary nodal metastatic disease; no mammographic evidence of malignancy involving the left breast.  Accordingly on 04/19/2019 she proceeded to biopsy of the right breast area in question. The pathology from this procedure showed ([id number]): invasive ductal carcinoma, grade III, upper inner quadrant, 12:30 o'clock, 5.0 cm from the nipple. Prognostic indicators significant for: estrogen receptor, 0% negative and progesterone receptor, 0% negative. Proliferation marker Ki67 at 90%. HER2 negative (0+).  Additional biopsies of the right breast and right axilla were performed on the same day. The pathology from this procedure showed ([id number]): 2. Breast, right, needle core biopsy, satellite mass UOQ, 10 o'clock, 8cmfn             - Invasive ductal carcinoma, grade III 3. Lymph node, needle/core biopsy, level 1 right axilla                         - Invasive ductal carcinoma, grade III  The patient's subsequent history is as detailed below.  MEDICAL HISTORY: Past Medical History:  Diagnosis Date   Anemia    after pregnancy   Anxiety    severe not taking meds   Asthma    inhaler  4x per day   Chronic pain    Family history of breast cancer    History of substance abuse ([place])    Pregnancy  induced hypertension    takes medication now.   Psoriasis    Trichomonas infection    Vaginal Pap smear, abnormal     ALLERGIES:  has No Known Allergies.  MEDICATIONS:  Current Outpatient Medications  Medication Sig Dispense Refill   acyclovir ointment (ZOVIRAX) 5 % Apply 1 application topically 2 (two) times daily as needed. Mix 1 part acyclovir cream with 1 part over the counter hydrocortisone 1% cream and apply to affected area two times a day as needed 5 g 0   albuterol (PROVENTIL HFA;VENTOLIN HFA) 108 (90 Base) MCG/ACT inhaler Inhale 2 puffs into the lungs 4 (four) times daily. 1 Inhaler 2   amoxicillin (AMOXIL) 500 MG tablet Take 1 tablet (500 mg total) by mouth 2 (two) times daily. 14 tablet 0   buprenorphine (SUBUTEX) 8 MG SUBL SL tablet Place 8 mg under the tongue 2 (two) times daily.      cholestyramine (QUESTRAN) 4 g packet Take 1 packet (4 g total) by mouth 3 (three) times daily with meals. 60 each 12   ciprofloxacin (CIPRO) 500 MG tablet Take 1 tablet (500 mg total) by mouth 2 (two) times daily. 10 tablet 0   cyclobenzaprine (FLEXERIL) 10 MG tablet Take 1 tablet (10 mg total) by mouth 3 (  three) times daily as needed for muscle spasms. 90 tablet 2   dexamethasone (DECADRON) 4 MG tablet TAKE 2 TABLETS BY MOUTH DAILY ON THE DAY AFTER CHEMOTHERAPY, AND THEN TAKE 2 TABLETS TWICE DAILY FOR 2 DAYS AFTERWARDS. TAKE WITH FOOD 30 tablet 1   enalapril (VASOTEC) 10 MG tablet TAKE 1 TABLET BY MOUTH DAILY 90 tablet 0   fluconazole (DIFLUCAN) 200 MG tablet Take 1 tablet (200 mg total) by mouth daily. 30 tablet 0   gabapentin (NEURONTIN) 300 MG capsule Take 1 capsule (300 mg total) by mouth 3 (three) times daily. 90 capsule 0   hydrocortisone (ANUSOL-HC) 2.5 % rectal cream Place 1 application rectally 2 (two) times daily. 60 g 0   ketoconazole (NIZORAL) 2 % cream Apply 1 application topically daily. 15 g 0   omeprazole (PRILOSEC) 40 MG capsule Take 1 capsule (40 mg total) by  mouth daily. 30 capsule 5   ondansetron (ZOFRAN) 8 MG tablet TAKE 1 TABLET(8 MG) BY MOUTH EVERY 8 HOURS AS NEEDED FOR NAUSEA OR VOMITING 20 tablet 0   PARoxetine (PAXIL) 10 MG tablet Take 1 tablet (10 mg total) by mouth daily. (Patient taking differently: Take 10 mg by mouth daily. Patient is taking 2 tablets daily per MD) 30 tablet 5   predniSONE (DELTASONE) 5 MG tablet 6 tab x 1 day, 5 tab x 1 day, 4 tab x 1 day, 3 tab x 1 day, 2 tab x 1 day, 1 tab x 1 day, stop 21 tablet 0   Prenatal MV-Min-FA-Omega-3 (PRENATAL GUMMIES/DHA & FA) 0.4-32.5 MG CHEW Chew 2 each by mouth daily.     prochlorperazine (COMPAZINE) 10 MG tablet TAKE 1 TABLET(10 MG) BY MOUTH EVERY 6 HOURS AS NEEDED FOR NAUSEA OR VOMITING 30 tablet 1   prochlorperazine (COMPAZINE) 10 MG tablet Take 1 tablet (10 mg total) by mouth every 6 (six) hours as needed for nausea or vomiting. 30 tablet 0   valACYclovir (VALTREX) 1000 MG tablet Take 1 tablet (1,000 mg total) by mouth 2 (two) times daily. 60 tablet 0   No current facility-administered medications for this visit.    Facility-Administered Medications Ordered in Other Visits  Medication Dose Route Frequency Provider Last Rate Last Dose   0.9 %  sodium chloride infusion   Intravenous Once Estes, Terri Dad, MD       0.9 % NaCl with KCl 20 mEq/ L  infusion   Intravenous Once Estes, Terri Dad, MD       heparin lock flush 100 unit/mL  500 Units Intracatheter Once PRN Estes, Terri Dad, MD       pembrolizumab [facility]) 200 mg in sodium chloride 0.9 % 50 mL chemo infusion  200 mg Intravenous Once Estes, Terri Dad, MD       sodium chloride flush (NS) 0.9 % injection 10 mL  10 mL Intracatheter PRN Estes, Terri Dad, MD       sodium chloride flush (NS) 0.9 % injection 10 mL  10 mL Intracatheter PRN Estes, Terri Dad, MD        SURGICAL HISTORY:  Past Surgical History:  Procedure Laterality Date   CESAREAN SECTION  05/26/2008   CESAREAN SECTION N/A 07/10/2017    Procedure: CESAREAN SECTION;  Surgeon: Florian Buff, MD;  Location: [place];  Service: Obstetrics;  Laterality: N/A;   CESAREAN SECTION N/A 09/23/2018   Procedure: CESAREAN SECTION;  Surgeon: Terri Jude, MD;  Location: [place];  Service: Obstetrics;  Laterality: N/A;  COLPOSCOPY     PORTACATH PLACEMENT Left 04/30/2019   Procedure: INSERTION PORT-A-CATH WITH ULTRASOUND;  Surgeon: Terri Overall, MD;  Location: [facility];  Service: General;  Laterality: Left;  Left Subclavian vein   WISDOM TOOTH EXTRACTION     FAMILY HISTORY:      Family History  Problem Relation Age of Onset   Kidney disease Maternal Grandmother    Breast cancer Maternal Grandmother 28       metastatic to liver; d. 32   Kidney disease Maternal Grandfather    Kidney disease Paternal Grandmother    Diabetes Paternal Grandmother    Breast cancer Paternal Grandmother    Kidney disease Paternal Grandfather    Breast cancer Paternal Aunt    Cerebral palsy Child    Breast cancer Paternal Great-grandmother    Pancreatic cancer Other        PGMs brother    Throat cancer Paternal Uncle    Breast cancer Other        PGMs sister  (Updated 04/25/2019) Breeonna's father is living at age 30. Patients' mother is also living at age 75. [place] notes, however, that she was raised by her grandmother.) The patient has 0 brothers and 5 sisters. Patient denies anyone in her family having ovarian, prostate, or pancreatic cancer. Her maternal grandmother was diagnosed with breast cancer, unsure what age. On her father's side, she reports her grandmother had cysts in her breasts, her great-aunt might have had breast cancer, and her great-grandmother was diagnosed with breast cancer at an older age.  GYNECOLOGIC HISTORY:  No LMP recorded. (Menstrual status: Chemotherapy).  She had an emergent C-section in 09/23/2018 for abruptio placenta at 30 weeks pregnancy, also has a history of  eclampsia Menarche:    years old Age at first live birth: 33 years old [place] P: 3 LMP: 04/18/2019 Contraceptive: none HRT: none Hysterectomy?: no BSO?: no  SOCIAL HISTORY: (Current as of August 2020) Terri Estes is currently unemployed. She previously worked at [employer]. She is currently engaged. Fiance [name] runs a Midwife and works other odd jobs. Daughter Demetrius Charity, age 2, was abused by her father at 70 months and has cerebral palsy. Son [name], age 57, has severe asthma and is developmentally disabled. Daughter Yetta Glassman was born on 09/23/2018 prematurely and remains on oxygen at home.  The patient's aunt is currently staying with her and is helping with the children and providing general support              ADVANCED DIRECTIVES: not in place.   REVIEW OF SYSTEMS:   Review of Systems  Constitutional: Positive for appetite change, weight loss, and fatigue. Negative for chills and fever  HENT: Positive for mild "throat irritation". Negative for mouth sores, nosebleeds, and trouble swallowing.   Eyes: Negative for eye problems and icterus.  Respiratory: Negative for cough, hemoptysis, shortness of breath and wheezing.   Cardiovascular: Negative for chest pain and leg swelling.  Gastrointestinal: Positive for mild nausea. Negative for abdominal pain, constipation, diarrhea, and vomiting.  Genitourinary: Negative for bladder incontinence, difficulty urinating, dysuria, frequency and hematuria.   Musculoskeletal: Negative for back pain, gait problem, neck pain and neck stiffness.  Skin: Positive for itching under her right breast (improved). Neurological: Negative for dizziness, extremity weakness, gait problem, headaches, light-headedness and seizures.  Hematological: Negative for adenopathy. Does not bruise/bleed easily.  Psychiatric/Behavioral: Negative for confusion, depression and sleep disturbance. The patient is not nervous/anxious.     PHYSICAL EXAMINATION:  Blood  pressure  94/76, pulse (!) 122, temperature 98 F (36.7 C), temperature source Temporal, resp. rate 18, height 5' (1.524 m), weight 195 lb 3.2 oz (88.5 kg), SpO2 100 %, not currently breastfeeding.  ECOG PERFORMANCE STATUS: 1 - Symptomatic but completely ambulatory  Physical Exam  Constitutional: Oriented to person, place, and time and well-developed, well-nourished, and in no distress.  HENT:  Head: Normocephalic and atraumatic. EAC normal without erythema. Tympanic membrane grey, translucent, with a normal light reflex. No bulging or erythema of the tympanic membrane. Mouth/Throat: Oropharynx is clear and moist. No oropharyngeal exudate. No oral lesions. No erythema of the posterior pharynx.  Eyes: Conjunctivae are normal. Right eye exhibits no discharge. Left eye exhibits no discharge. No scleral icterus.  Neck: Normal range of motion. Neck supple.  Cardiovascular: Normal rate, regular rhythm, normal heart sounds and intact distal pulses.   Pulmonary/Chest: Effort normal and breath sounds normal. No respiratory distress. No wheezes. No rales.  Abdominal: Soft. Bowel sounds are normal. Exhibits no distension and no mass. There is no tenderness.  Musculoskeletal: Normal range of motion. Exhibits no edema.  Lymphadenopathy:    No cervical adenopathy.  Neurological: Alert and oriented to person, place, and time. Exhibits normal muscle tone. Gait normal. Coordination normal.  Skin: Skin is warm and dry. No rash noted. Not diaphoretic. No erythema. No pallor.  Psychiatric: Mood, memory and judgment normal.  Vitals reviewed.  LABORATORY DATA: Lab Results  Component Value Date   WBC 0.6 (LL) 08/22/2019   HGB 9.4 (L) 08/22/2019   HCT 28.2 (L) 08/22/2019   MCV 98.9 08/22/2019   PLT 163 08/22/2019      Chemistry      Component Value Date/Time   NA 138 08/22/2019 1355   NA 137 05/10/2018 1535   K 3.9 08/22/2019 1355   CL 102 08/22/2019 1355   CO2 25 08/22/2019 1355   BUN 12 08/22/2019  1355   BUN 7 05/10/2018 1535   CREATININE 0.73 08/22/2019 1355   CREATININE 0.81 07/03/2019 0919      Component Value Date/Time   CALCIUM 9.7 08/22/2019 1355   ALKPHOS 102 08/22/2019 1355   AST 10 (L) 08/22/2019 1355   AST 54 (H) 07/03/2019 0919   ALT 18 08/22/2019 1355   ALT 102 (H) 07/03/2019 0919   BILITOT 0.7 08/22/2019 1355   BILITOT <0.2 (L) 07/03/2019 0919       RADIOGRAPHIC STUDIES:  No results found.  ELIGIBLE FOR AVAILABLE RESEARCH PROTOCOL: no   ASSESSMENT: 33 y.o. [place] woman status post right breast biopsy x2 and right axillary lymph node biopsy 04/19/2019 for a clinical T4 N2 MX, stage IIIC invasive ductal carcinoma, grade 3, triple negative, with an MIB-1 of 90%             (a) chest CT scan 04/11/2019 shows multiple subcentimeter lung nodules             (b) PET scan denied by insurance             (c) CT A/P and bone scan due 05/09/2019 show a very small low-density lesion in the lateral segment of the left liver which is indeterminate, and sclerosis of the right SI joint, which is felt to be likely degenerative (negative by bone scan)..             (c) baseline breast MRI 04/30/2019 shows involvement of all 4 quadrants in the right breast as well as right axillary subpectoral and internal mammary lymphadenopathy.  Left breast was  unremarkable  (1) neoadjuvant chemotherapy consisting of             (a) pembrolizumab started 05/03/2019, third dose delayed because of elevated liver function tests, resumed 08/02/2019             (b) paclitaxel weekly x12 with carboplatin every 21 days x 4, started 05/08/2019                         (i) tolerated carboplatin AUC 5-day 1 poorly, switched to AUC 2 on day 1 day 8                         (ii) cycle 2 delayed 1 week because of elevated liver function tests                         (iii) carboplatin and paclitaxel discontinued after 5 doses because of neuropathy, (received 7 weekly doses paclitaxel and one AUC5 carbo  dose, four AUC2 doses)             (c) cyclophosphamide and doxorubicin x4 started 07/03/2019                         (i) 2d dose given day 28 due to poor tolerance; other doses Q 21 days              (2) definitive surgery pending  (3) adjuvant radiation to follow  (4) genetics testing             (a) genetic testing 05/14/2019 through the Common Hereditary Gene Panel offered by [facility] found no deleterious mutations in APC, ATM, AXIN2, BARD1, BMPR1A, BRCA1, BRCA2, BRIP1, CDH1, CDK4, CDKN2A (p14ARF), CDKN2A (p16INK4a), CHEK2, CTNNA1, DICER1, EPCAM (Deletion/duplication testing only), GREM1 (promoter region deletion/duplication testing only), KIT, MEN1, MLH1, MSH2, MSH3, MSH6, MUTYH, NBN, NF1, NHTL1, PALB2, PDGFRA, PMS2, POLD1, POLE, PTEN, RAD50, RAD51C, RAD51D, RNF43, SDHB, SDHC, SDHD, SMAD4, SMARCA4. STK11, TP53, TSC1, TSC2, and VHL. The following genes were evaluated for sequence changes only: SDHA and HOXB13 c.251G>A variant only  (5) goserelin started 05/08/2019  (6) pembrolizumab to be continued and minimum of 1 year   PLAN: Terri Estes is a pleasant 33 year old African American female being treated for triple negative breast cancer of the right breast. The patient is status post 3 of 4 chemotherapy treatments with doxorubicin and cyclophosphamide. She is scheduled for day 1 of cycle #4 on 08/29/2019.   The patient is here today for evaluation before receiving her 4th treatment of pembrolizumab 200 mg IV every 3 weeks. The plan is to complete pembrolizumab every 3 weeks beyond completion of chemotherapy. She will receive her 4th treatment of pembrolizumab today as scheduled.   Labs were reviewed with the patient. The labs show neutropenia with WBCs of 0.6. Her ANC is 0.1 today. The patient believes her Onpro malfunctioned last week.   We will arrange for the patient to receive 3 doses of granix today, tomorrow, and the following day. 500 mg BID for 5 days of ciprofloxacin was sent to  the patient's pharmacy for anti-biotic prophylaxis.   The patient will continue to use her anti-emetic for nausea. She was given a one time dose of IV compazine in the clinic today for nausea.  Regarding the patient's weight loss, poor fluid intake, and hypotension today, the patient will receive an additional 1 L of normal  saline today, tomorrow, and Friday. I also have placed a referral the nutritionist to evaluate the patient. The patient is interested in speaking with them.   Neutropenic precautions were discussed with the patient. She was instructed to seek medical evaluation if she develops signs and symptoms of infection. The patient is afebrile today and non-toxic appearing. She mentioned while she was in the infusion room that she may have developed an "irritated throat" yesterday. The patient refused a rapid strep test today. Physical exam did not reveal any evidence of infection, thrush, or ulcerations. She also then endorsed possible ear "itchiness"/discomfort. The patient was recently treated for an ear infection. No evidence of recurrence on physical exam. But the patient was encouraged to call us for any new or worsening symptoms.  I offered a referral to the cancer center's free counseling intern. The patient is already established with a counselor. She knows we are happy to refer her should she be interested in meeting with her in the future.  Per Dr. [name] last note, the plan is to have a repeat breast MRI and to see her surgeon after completing her last cycle of chemotherapy. The patient was provided the number to [facility] to schedule her breast MRI.  We will see her back for a follow up visit next week for labs and evaluation before receiving her last cycle of chemotherapy.   The patient was advised to call immediately if she has any concerning symptoms in the interval. The patient voices understanding of current disease status and treatment options and is in  agreement with the current care plan. All questions were answered. The patient knows to call the clinic with any problems, questions or concerns. We can certainly see the patient much sooner if necessary  Orders Placed This Encounter  Procedures   Ambulatory referral to Nutrition and Diabetic E    Referral Priority:   Routine    Referral Type:   Consultation    Referral Reason:   Specialty Services Required    Number of Visits Requested:   [place], PA-C 08/22/19

## 2019-08-22 NOTE — Patient Instructions (Signed)
Neutropenia Neutropenia is a condition that occurs when you have a lower-than-normal level of a type of white blood cell (neutrophil) in your body. Neutrophils are made in the spongy center of large bones (bone marrow), and they fight infections. Neutrophils are your body's main defense against bacterial and fungal infections. The fewer neutrophils you have and the longer your body remains without them, the greater your risk of getting a severe infection. What are the causes? This condition can occur if your body uses up or destroys neutrophils faster than your bone marrow can make them. Neutropenia may be caused by:  A bacterial or fungal infection.  Allergic disorders.  Reactions to some medicines.  An autoimmune disease.  An enlarged spleen. This condition can also occur if your bone marrow does not produce enough neutrophils. This problem may be caused by:  Cancer.  Cancer treatments, such as radiation or chemotherapy.  Viral infections.  Medicines, such as phenytoin.  Vitamin B12 deficiency.  Diseases of the bone marrow.  Environmental toxins, such as insecticides. What are the signs or symptoms? This condition does not usually cause symptoms. If symptoms are present, they are usually caused by an underlying infection. Symptoms of an infection may include:  Fever.  Chills.  Swollen glands.  Oral or anal ulcers.  Cough and shortness of breath.  Rash.  Skin infection.  Fatigue. How is this diagnosed? Your health care provider may suspect neutropenia if you have:  A condition that may cause neutropenia.  Symptoms during or after treatment for cancer.  Symptoms of infection, especially fever.  Frequent and unusual infections. This condition is diagnosed based on your medical history and a physical exam. Tests will also be done, such as:  A complete blood count (CBC).  A procedure to collect a sample of bone marrow for examination (bone marrow biopsy).   A chest X-ray.  A urine culture.  A blood culture. How is this treated? Treatment depends on the underlying cause and severity of your condition. Mild neutropenia may not require treatment. Treatment may include medicines, such as:  Antibiotic medicine given through an IV.  Antiviral medicines.  Antifungal medicines.  A medicine to increase neutrophil production (colony-stimulating factor). You may get this drug through an IV or by injection.  Steroids given through an IV. If an underlying condition is causing neutropenia, you may need treatment for that condition. If medicines or cancer treatments are causing neutropenia, your health care provider may have you stop the medicines or treatment. Follow these instructions at home: Medicines   Take over-the-counter and prescription medicines only as told by your health care provider.  Get a seasonal flu shot (influenza vaccine).  Avoid people who received a vaccine in the past 30 days if that vaccine contained a live version of the germ (live vaccine). You should not get a live vaccine. Common live vaccines are polio, MMR, chicken pox, and shingles vaccines. Eating and drinking  Do not share food utensils.  Do not eat unpasteurized foods.  Do not eat raw or undercooked meat, eggs, or seafood.  Do not eat unwashed, raw fruits or vegetables. Lifestyle  Avoid exposure to groups of people or children.  Avoid being around people who are sick.  Avoid being around dirt or dust, such as in construction areas or gardens.  Do not provide direct care for pets. Avoid animal droppings. Do not clean litter boxes and bird cages.  Do not have sex unless your health care provider has approved. Hygiene     Bathe daily.  Clean the area between the genitals and the anus (perineal area) after you urinate or have a bowel movement. If you are female, wipe from front to back.  Brush your teeth with a soft toothbrush before and after meals.   Do not use a regular razor. Use an electric razor to remove hair.  Wash your hands often. Make sure others who come in contact with you also wash their hands. If soap and water are not available, use hand sanitizer. General instructions  Follow any precautions as told by your health care provider to reduce your risk for injury or infection.  Take actions to avoid cuts and burns. For example: ? Be cautious when you use knives. Always cut away from yourself. ? Keep knives in protective sheaths or guards when not in use. ? Use oven mitts when you cook with a hot stove, oven, or grill. ? Stand a safe distance away from open fires.  Do not use tampons, enemas, or rectal suppositories unless your health care provider has approved.  Keep all follow-up visits as told by your health care provider. This is important. Contact a health care provider if:  You have: ? A sore throat. ? A warm, red, or tender area on your skin. ? A cough. ? Frequent or painful urination. ? Vaginal discharge or itching.  You develop: ? Sores in your mouth or anus. ? Swollen lymph nodes. ? Red streaks on the skin. ? A rash. Get help right away if:  You have: ? A fever. ? Chills, or you start to shake.  You feel: ? Nauseous, or you vomit. ? Very fatigued. ? Short of breath. Summary  Neutropenia is a condition that occurs when you have a lower-than-normal level of a type of white blood cell (neutrophil) in your body.  This condition can occur if your body uses up or destroys neutrophils faster than your bone marrow can make them.  Treatment depends on the underlying cause and severity of your condition. Mild neutropenia may not require treatment.  Follow any precautions as told by your health care provider to reduce your risk for injury or infection. This information is not intended to replace advice given to you by your health care provider. Make sure you discuss any questions you have with your health  care provider. Document Released: 06/04/2002 Document Revised: 09/28/2018 Document Reviewed: 09/28/2018 Elsevier Patient Education  2020 Elsevier Inc.  

## 2019-08-22 NOTE — Telephone Encounter (Signed)
Received call report from [facility].  "Today's WBC = 0.6.  ANC = 0.1"  Reached provider with results and notified of scheduled A.P.P F/U today with treatment scheduled.   No response with this call.

## 2019-08-22 NOTE — Progress Notes (Signed)
OK to treat with ANC 0.1 per Cassie Heilingoepter, PA.  Pt to get granix in addition to treatment.

## 2019-08-22 NOTE — Patient Instructions (Signed)
[facility] Discharge Instructions for Patients Receiving Chemotherapy  Today you received the following chemotherapy agents:  Keytruda.  To help prevent nausea and vomiting after your treatment, we encourage you to take your nausea medication as directed.   If you develop nausea and vomiting that is not controlled by your nausea medication, call the clinic.   BELOW ARE SYMPTOMS THAT SHOULD BE REPORTED IMMEDIATELY:  *FEVER GREATER THAN 100.5 F  *CHILLS WITH OR WITHOUT FEVER  NAUSEA AND VOMITING THAT IS NOT CONTROLLED WITH YOUR NAUSEA MEDICATION  *UNUSUAL SHORTNESS OF BREATH  *UNUSUAL BRUISING OR BLEEDING  TENDERNESS IN MOUTH AND THROAT WITH OR WITHOUT PRESENCE OF ULCERS  *URINARY PROBLEMS  *BOWEL PROBLEMS  UNUSUAL RASH Items with * indicate a potential emergency and should be followed up as soon as possible.  Feel free to call the clinic should you have any questions or concerns. The clinic phone number is [phone].  Please show the CHEMO ALERT CARD at check-in to the Emergency Department and triage nurse.    

## 2019-08-22 NOTE — Patient Instructions (Signed)

## 2019-08-23 ENCOUNTER — Inpatient Hospital Stay: Payer: Medicaid Other

## 2019-08-23 ENCOUNTER — Ambulatory Visit: Payer: Medicaid Other

## 2019-08-23 ENCOUNTER — Other Ambulatory Visit: Payer: Self-pay

## 2019-08-23 VITALS — BP 92/64 | HR 104 | Temp 98.5°F | Resp 16

## 2019-08-23 DIAGNOSIS — Z171 Estrogen receptor negative status [ER-]: Secondary | ICD-10-CM

## 2019-08-23 DIAGNOSIS — E860 Dehydration: Secondary | ICD-10-CM

## 2019-08-23 DIAGNOSIS — Z5111 Encounter for antineoplastic chemotherapy: Secondary | ICD-10-CM | POA: Diagnosis not present

## 2019-08-23 DIAGNOSIS — C50811 Malignant neoplasm of overlapping sites of right female breast: Secondary | ICD-10-CM

## 2019-08-23 MED ORDER — TBO-FILGRASTIM 480 MCG/0.8ML ~~LOC~~ SOSY
480.0000 ug | PREFILLED_SYRINGE | Freq: Once | SUBCUTANEOUS | Status: AC
  Administered 2019-08-23: 16:00:00 480 ug via SUBCUTANEOUS

## 2019-08-23 MED ORDER — SODIUM CHLORIDE 0.9 % IV SOLN
Freq: Once | INTRAVENOUS | Status: AC
  Administered 2019-08-23: 15:00:00 via INTRAVENOUS
  Filled 2019-08-23: qty 250

## 2019-08-23 MED ORDER — HEPARIN SOD (PORK) LOCK FLUSH 100 UNIT/ML IV SOLN
500.0000 [IU] | Freq: Once | INTRAVENOUS | Status: AC
  Administered 2019-08-23: 500 [IU] via INTRAVENOUS
  Filled 2019-08-23: qty 5

## 2019-08-23 MED ORDER — TBO-FILGRASTIM 480 MCG/0.8ML ~~LOC~~ SOSY
PREFILLED_SYRINGE | SUBCUTANEOUS | Status: AC
  Filled 2019-08-23: qty 0.8

## 2019-08-23 MED ORDER — SODIUM CHLORIDE 0.9% FLUSH
10.0000 mL | INTRAVENOUS | Status: DC | PRN
  Administered 2019-08-23: 10 mL via INTRAVENOUS
  Filled 2019-08-23: qty 10

## 2019-08-23 NOTE — Patient Instructions (Signed)
Rehydration, Adult Rehydration is the replacement of body fluids and salts and minerals (electrolytes) that are lost during dehydration. Dehydration is when there is not enough fluid or water in the body. This happens when you lose more fluids than you take in. Common causes of dehydration include:  Vomiting.  Diarrhea.  Excessive sweating, such as from heat exposure or exercise.  Taking medicines that cause the body to lose excess fluid (diuretics).  Impaired kidney function.  Not drinking enough fluid.  Certain illnesses or infections.  Certain poorly controlled long-term (chronic) illnesses, such as diabetes, heart disease, and kidney disease.  Symptoms of mild dehydration may include thirst, dry lips and mouth, dry skin, and dizziness. Symptoms of severe dehydration may include increased heart rate, confusion, fainting, and not urinating. You can rehydrate by drinking certain fluids or getting fluids through an IV tube, as told by your health care provider. What are the risks? Generally, rehydration is safe. However, one problem that can happen is taking in too much fluid (overhydration). This is rare. If overhydration happens, it can cause an electrolyte imbalance, kidney failure, or a decrease in salt (sodium) levels in the body. How to rehydrate Follow instructions from your health care provider for rehydration. The kind of fluid you should drink and the amount you should drink depend on your condition.  If directed by your health care provider, drink an oral rehydration solution (ORS). This is a drink designed to treat dehydration that is found in pharmacies and retail stores. ? Make an ORS by following instructions on the package. ? Start by drinking small amounts, about  cup (120 mL) every 5-10 minutes. ? Slowly increase how much you drink until you have taken the amount recommended by your health care provider.  Drink enough clear fluids to keep your urine clear or pale  yellow. If you were instructed to drink an ORS, finish the ORS first, then start slowly drinking other clear fluids. Drink fluids such as: ? Water. Do not drink only water. Doing that can lead to having too little sodium in your body (hyponatremia). ? Ice chips. ? Fruit juice that you have added water to (diluted juice). ? Low-calorie sports drinks.  If you are severely dehydrated, your health care provider may recommend that you receive fluids through an IV tube in the hospital.  Do not take sodium tablets. Doing that can lead to the condition of having too much sodium in your body (hypernatremia). Eating while you rehydrate Follow instructions from your health care provider about what to eat while you rehydrate. Your health care provider may recommend that you slowly begin eating regular foods in small amounts.  Eat foods that contain a healthy balance of electrolytes, such as bananas, oranges, potatoes, tomatoes, and spinach.  Avoid foods that are greasy or contain a lot of fat or sugar.  In some cases, you may get nutrition through a feeding tube that is passed through your nose and into your stomach (nasogastric tube, or NG tube). This may be done if you have uncontrolled vomiting or diarrhea. Beverages to avoid Certain beverages may make dehydration worse. While you rehydrate, avoid:  Alcohol.  Caffeine.  Drinks that contain a lot of sugar. These include: ? High-calorie sports drinks. ? Fruit juice that is not diluted. ? Soda.  Check nutrition labels to see how much sugar or caffeine a beverage contains. Signs of dehydration recovery You may be recovering from dehydration if:  You are urinating more often than before you started   rehydrating.  Your urine is clear or pale yellow.  Your energy level improves.  You vomit less frequently.  You have diarrhea less frequently.  Your appetite improves or returns to normal.  You feel less dizzy or less light-headed.  Your  skin tone and color start to look more normal. Contact a health care provider if:  You continue to have symptoms of mild dehydration, such as: ? Thirst. ? Dry lips. ? Slightly dry mouth. ? Dry, warm skin. ? Dizziness.  You continue to vomit or have diarrhea. Get help right away if:  You have symptoms of dehydration that get worse.  You feel: ? Confused. ? Weak. ? Like you are going to faint.  You have not urinated in 6-8 hours.  You have very dark urine.  You have trouble breathing.  Your heart rate while sitting still is over 100 beats a minute.  You cannot drink fluids without vomiting.  You have vomiting or diarrhea that: ? Gets worse. ? Does not go away.  You have a fever. This information is not intended to replace advice given to you by your health care provider. Make sure you discuss any questions you have with your health care provider. Document Released: 03/06/2012 Document Revised: 11/25/2017 Document Reviewed: 02/06/2016 Elsevier Patient Education  [place].  Tbo-Filgrastim injection What is this medicine? TBO-FILGRASTIM (T B O fil GRA stim) is a granulocyte colony-stimulating factor that helps you make more neutrophils, a type of white blood cell. Neutrophils are important for fighting infections. Some chemotherapy affects your bone marrow and lowers your neutrophils. This medicine helps decrease the length of time that neutrophils are very low (severe neutropenia). This medicine may be used for other purposes; ask your health care provider or pharmacist if you have questions. COMMON BRAND NAME(S): Granix What should I tell my health care provider before I take this medicine? They need to know if you have any of these conditions:  bone scan or tests planned  kidney disease  sickle cell anemia  an unusual or allergic reaction to tbo-filgrastim, filgrastim, pegfilgrastim, other medicines, foods, dyes, or preservatives  pregnant or trying to  get pregnant  breast-feeding How should I use this medicine? This medicine is for injection under the skin. If you get this medicine at home, you will be taught how to prepare and give this medicine. Refer to the Instructions for Use that come with your medication packaging. Use exactly as directed. Take your medicine at regular intervals. Do not take your medicine more often than directed. It is important that you put your used needles and syringes in a special sharps container. Do not put them in a trash can. If you do not have a sharps container, call your pharmacist or healthcare provider to get one. Talk to your pediatrician regarding the use of this medicine in children. While this drug may be prescribed for children as young as 15 month of age for selected conditions, precautions do apply. Overdosage: If you think you have taken too much of this medicine contact a poison control center or emergency room at once. NOTE: This medicine is only for you. Do not share this medicine with others. What if I miss a dose? It is important not to miss your dose. Call your doctor or health care professional if you miss a dose. What may interact with this medicine? This medicine may interact with the following medications:  medicines that may cause a release of neutrophils, such as lithium This list  may not describe all possible interactions. Give your health care provider a list of all the medicines, herbs, non-prescription drugs, or dietary supplements you use. Also tell them if you smoke, drink alcohol, or use illegal drugs. Some items may interact with your medicine. What should I watch for while using this medicine? You may need blood work done while you are taking this medicine. What side effects may I notice from receiving this medicine? Side effects that you should report to your doctor or health care professional as soon as possible:  allergic reactions like skin rash, itching or hives, swelling  of the face, lips, or tongue  back pain  blood in the urine  dark urine  dizziness  fast heartbeat  feeling faint  shortness of breath or breathing problems  signs and symptoms of infection like fever or chills; cough; or sore throat  signs and symptoms of kidney injury like trouble passing urine or change in the amount of urine  stomach or side pain, or pain at the shoulder  sweating  swelling of the legs, ankles, or abdomen  tiredness Side effects that usually do not require medical attention (report to your doctor or health care professional if they continue or are bothersome):  bone pain  diarrhea  headache  muscle pain  vomiting This list may not describe all possible side effects. Call your doctor for medical advice about side effects. You may report side effects to FDA at 1-800-FDA-1088. Where should I keep my medicine? Keep out of the reach of children. Store in a refrigerator between 2 and 8 degrees C (36 and 46 degrees F). Keep in carton to protect from light. Throw away this medicine if it is left out of the refrigerator for more than 5 consecutive days. Throw away any unused medicine after the expiration date. NOTE: This sheet is a summary. It may not cover all possible information. If you have questions about this medicine, talk to your doctor, pharmacist, or health care provider.  2020 Elsevier/Gold Standard (2018-10-14 19:58:39)

## 2019-08-24 ENCOUNTER — Inpatient Hospital Stay: Payer: Medicaid Other

## 2019-08-24 ENCOUNTER — Other Ambulatory Visit: Payer: Self-pay

## 2019-08-24 ENCOUNTER — Ambulatory Visit: Payer: Medicaid Other

## 2019-08-24 ENCOUNTER — Telehealth: Payer: Self-pay | Admitting: *Deleted

## 2019-08-24 VITALS — BP 105/67 | HR 102 | Resp 16

## 2019-08-24 DIAGNOSIS — C50811 Malignant neoplasm of overlapping sites of right female breast: Secondary | ICD-10-CM

## 2019-08-24 DIAGNOSIS — Z5111 Encounter for antineoplastic chemotherapy: Secondary | ICD-10-CM | POA: Diagnosis not present

## 2019-08-24 MED ORDER — HEPARIN SOD (PORK) LOCK FLUSH 100 UNIT/ML IV SOLN
500.0000 [IU] | Freq: Once | INTRAVENOUS | Status: AC
  Administered 2019-08-24: 17:00:00 500 [IU] via INTRAVENOUS
  Filled 2019-08-24: qty 5

## 2019-08-24 MED ORDER — TBO-FILGRASTIM 480 MCG/0.8ML ~~LOC~~ SOSY
PREFILLED_SYRINGE | SUBCUTANEOUS | Status: AC
  Filled 2019-08-24: qty 0.8

## 2019-08-24 MED ORDER — SODIUM CHLORIDE 0.9 % IV SOLN
INTRAVENOUS | Status: DC
  Administered 2019-08-24: 15:00:00 via INTRAVENOUS
  Filled 2019-08-24 (×2): qty 250

## 2019-08-24 MED ORDER — SODIUM CHLORIDE 0.9% FLUSH
10.0000 mL | Freq: Once | INTRAVENOUS | Status: AC
  Administered 2019-08-24: 17:00:00 10 mL via INTRAVENOUS
  Filled 2019-08-24: qty 10

## 2019-08-24 MED ORDER — TBO-FILGRASTIM 480 MCG/0.8ML ~~LOC~~ SOSY
480.0000 ug | PREFILLED_SYRINGE | Freq: Once | SUBCUTANEOUS | Status: AC
  Administered 2019-08-24: 480 ug via SUBCUTANEOUS

## 2019-08-24 NOTE — Telephone Encounter (Signed)
Left message for patient to please cal GI to schedule her MRI.  I also called GI to schedule and she has an appointment 9/11 at 11:10am.  I left message with infusion nurse to give her this appointment when she comes in today for her IVF's.

## 2019-08-24 NOTE — Patient Instructions (Signed)
Dehydration, Adult  Dehydration is when there is not enough fluid or water in your body. This happens when you lose more fluids than you take in. Dehydration can range from mild to very bad. It should be treated right away to keep it from getting very bad. Symptoms of mild dehydration may include:  Thirst.  Dry lips.  Slightly dry mouth.  Dry, warm skin.  Dizziness. Symptoms of moderate dehydration may include:  Very dry mouth.  Muscle cramps.  Dark pee (urine). Pee may be the color of tea.  Your body making less pee.  Your eyes making fewer tears.  Heartbeat that is uneven or faster than normal (palpitations).  Headache.  Light-headedness, especially when you stand up from sitting.  Fainting (syncope). Symptoms of very bad dehydration may include:  Changes in skin, such as: ? Cold and clammy skin. ? Blotchy (mottled) or pale skin. ? Skin that does not quickly return to normal after being lightly pinched and let go (poor skin turgor).  Changes in body fluids, such as: ? Feeling very thirsty. ? Your eyes making fewer tears. ? Not sweating when body temperature is high, such as in hot weather. ? Your body making very little pee.  Changes in vital signs, such as: ? Weak pulse. ? Pulse that is more than 100 beats a minute when you are sitting still. ? Fast breathing. ? Low blood pressure.  Other changes, such as: ? Sunken eyes. ? Cold hands and feet. ? Confusion. ? Lack of energy (lethargy). ? Trouble waking up from sleep. ? Short-term weight loss. ? Unconsciousness. Follow these instructions at home:   If told by your doctor, drink an ORS: ? Make an ORS by using instructions on the package. ? Start by drinking small amounts, about  cup (120 mL) every 5-10 minutes. ? Slowly drink more until you have had the amount that your doctor said to have.  Drink enough clear fluid to keep your pee clear or pale yellow. If you were told to drink an ORS, finish the  ORS first, then start slowly drinking clear fluids. Drink fluids such as: ? Water. Do not drink only water by itself. Doing that can make the salt (sodium) level in your body get too low (hyponatremia). ? Ice chips. ? Fruit juice that you have added water to (diluted). ? Low-calorie sports drinks.  Avoid: ? Alcohol. ? Drinks that have a lot of sugar. These include high-calorie sports drinks, fruit juice that does not have water added, and soda. ? Caffeine. ? Foods that are greasy or have a lot of fat or sugar.  Take over-the-counter and prescription medicines only as told by your doctor.  Do not take salt tablets. Doing that can make the salt level in your body get too high (hypernatremia).  Eat foods that have minerals (electrolytes). Examples include bananas, oranges, potatoes, tomatoes, and spinach.  Keep all follow-up visits as told by your doctor. This is important. Contact a doctor if:  You have belly (abdominal) pain that: ? Gets worse. ? Stays in one area (localizes).  You have a rash.  You have a stiff neck.  You get angry or annoyed more easily than normal (irritability).  You are more sleepy than normal.  You have a harder time waking up than normal.  You feel: ? Weak. ? Dizzy. ? Very thirsty.  You have peed (urinated) only a small amount of very dark pee during 6-8 hours. Get help right away if:  You have   symptoms of very bad dehydration.  You cannot drink fluids without throwing up (vomiting).  Your symptoms get worse with treatment.  You have a fever.  You have a very bad headache.  You are throwing up or having watery poop (diarrhea) and it: ? Gets worse. ? Does not go away.  You have blood or something green (bile) in your throw-up.  You have blood in your poop (stool). This may cause poop to look black and tarry.  You have not peed in 6-8 hours.  You pass out (faint).  Your heart rate when you are sitting still is more than 100 beats a  minute.  You have trouble breathing. This information is not intended to replace advice given to you by your health care provider. Make sure you discuss any questions you have with your health care provider. Document Released: 10/09/2009 Document Revised: 11/25/2017 Document Reviewed: 02/06/2016 Elsevier Patient Education  2020 Elsevier Inc.  

## 2019-08-28 NOTE — Progress Notes (Signed)
[facility]  [phone] [phone]    ID: Terri Estes DOB: 1986/12/09  MR#: [id number]  ALP#:[id number]  Patient Care Team: Patient, No Pcp Per as PCP - General (General Practice) Alphonsa Overall, MD as Consulting Physician (General Surgery) , [name], MD as Consulting Physician (Oncology) Kyung Rudd, MD as Consulting Physician (Radiation Oncology) Rockwell Germany, RN as Oncology Nurse Navigator Mauro Kaufmann, RN as Oncology Nurse Navigator Donnamae Jude, MD as Consulting Physician (Obstetrics and Gynecology) Pavelock, Ralene Bathe, MD as Consulting Physician (Internal Medicine) OTHER MD: Joylene Igo for [facility] is [phone]   CHIEF COMPLAINT: Triple negative breast cancer  CURRENT TREATMENT: neoadjuvant chemotherapy, pembrolizumab, goserelin   INTERVAL HISTORY: Terri Estes returns today for follow-up and treatment of her estrogen negative breast cancer. She was last seen here on 08/14/2018.   She continues on neoadjuvant chemotherapy consisting of cyclophosphamide, and doxorubicin. Today is day 1 cycle 4 of 4 planned cycles of cyclophosphamide and doxorubicin.  She receives on pro support.  She is also on pembrolizumab, most recent dose 08/22/2019.  This is repeated every 21 days.  The plan is to continue that for at least 1 year.  We will be following her TSH on a regular basis.  Finally, she received goserelin every 4 weeks, with a dose due today  Since her last visit here, she has not undergone any additional studies. She is scheduled for a bilateral breast MRI on 09/07/2019.   REVIEW OF SYSTEMS: Terri Estes has been very anxious because of her home situation.  She is currently living in a hotel.  She has been offered a 2 bedroom home but she does not feel that is adequate.  All this is being worked through and she is also being encouraged or pushed to move to [place] but she is very worried by continuity of care and I do not  know if they would be able to continue the [place] and goserelin there as we have been doing here.  She also will need radiation after surgery.  That all would have to be worked out.  She has done a little bit of smoking.  She is very concerned that she may not be able to completely quit.  We discussed that at length today as well.  Otherwise a detailed review of systems today was stable  HISTORY OF CURRENT ILLNESS: From the original intake note:  Terri Estes presented with swelling along the left side of the face with neck pain. She then underwent a neck CT on 04/11/2019 showing: Enlarged left level 2 lymph nodes with associated inflammatory change including stranding of the adjacent fat and thickening of the platysma. While malignancy is not excluded, this is most concerning for infection. No focal abscess is present. Hypoattenuation within 1 of the left level 2 lymph nodes likely represents central necrosis or suppuration of the node. No primary malignancy or other abscess. Left upper lobe pulmonary nodules may be inflammatory. The largest measures 0.7 x 0.7 x 0.6 cm. CT of the chest with contrast may be useful for further evaluation.  She also underwent a chest CT on the same day showing: Complex right breast mass measuring 5.6 x 4.5 x 4.7 cm consistent with a primary breast carcinoma. Malignant right axillary adenopathy measuring up to 3.1 cm. Multiple bilateral pulmonary nodules are concerning for metastatic disease. Given the right breast mass, the left cervical lymph nodes are more likely malignant.  She then underwent bilateral diagnostic mammography with tomography and right  breast ultrasonography at [facility] on 04/19/2019 showing: Breast Density Category C; findings which are highly suspicious for multicentric inflammatory right breast cancer with right axillary nodal metastatic disease; no mammographic evidence of malignancy involving the left breast.  Accordingly on 04/19/2019  she proceeded to biopsy of the right breast area in question. The pathology from this procedure showed ([id number]): invasive ductal carcinoma, grade III, upper inner quadrant, 12:30 o'clock, 5.0 cm from the nipple. Prognostic indicators significant for: estrogen receptor, 0% negative and progesterone receptor, 0% negative. Proliferation marker Ki67 at 90%. HER2 negative (0+).  Additional biopsies of the right breast and right axilla were performed on the same day. The pathology from this procedure showed ([id number]): 2. Breast, right, needle core biopsy, satellite mass UOQ, 10 o'clock, 8cmfn  - Invasive ductal carcinoma, grade III 3. Lymph node, needle/core biopsy, level 1 right axilla   - Invasive ductal carcinoma, grade III  The patient's subsequent history is as detailed below.   PAST MEDICAL HISTORY: Past Medical History:  Diagnosis Date  . Anemia    after pregnancy  . Anxiety    severe not taking meds  . Asthma    inhaler  4x per day  . Chronic pain   . Family history of breast cancer   . History of substance abuse ([place])   . Pregnancy induced hypertension    takes medication now.  . Psoriasis   . Trichomonas infection   . Vaginal Pap smear, abnormal      PAST SURGICAL HISTORY: Past Surgical History:  Procedure Laterality Date  . CESAREAN SECTION  05/26/2008  . CESAREAN SECTION N/A 07/10/2017   Procedure: CESAREAN SECTION;  Surgeon: Florian Buff, MD;  Location: [facility];  Service: Obstetrics;  Laterality: N/A;  . CESAREAN SECTION N/A 09/23/2018   Procedure: CESAREAN SECTION;  Surgeon: Donnamae Jude, MD;  Location: [facility];  Service: Obstetrics;  Laterality: N/A;  . COLPOSCOPY    . PORTACATH PLACEMENT Left 04/30/2019   Procedure: INSERTION PORT-A-CATH WITH ULTRASOUND;  Surgeon: Alphonsa Overall, MD;  Location: [facility];  Service: General;  Laterality: Left;  Left Subclavian vein  . WISDOM TOOTH EXTRACTION       FAMILY HISTORY: Family History   Problem Relation Age of Onset  . Kidney disease Maternal Grandmother   . Breast cancer Maternal Grandmother 28       metastatic to liver; d. 59  . Kidney disease Maternal Grandfather   . Kidney disease Paternal Grandmother   . Diabetes Paternal Grandmother   . Breast cancer Paternal Grandmother   . Kidney disease Paternal Grandfather   . Breast cancer Paternal Aunt   . Cerebral palsy Child   . Breast cancer Paternal Great-grandmother   . Pancreatic cancer Other        PGMs brother   . Throat cancer Paternal Uncle   . Breast cancer Other        PGMs sister  (Updated 04/25/2019) Camree's father is living at age 90. Patients' mother is also living at age 57. [place] notes, however, that she was raised by her grandmother.) The patient has 0 brothers and 5 sisters. Patient denies anyone in her family having ovarian, prostate, or pancreatic cancer. Her maternal grandmother was diagnosed with breast cancer, unsure what age. On her father's side, she reports her grandmother had cysts in her breasts, her great-aunt might have had breast cancer, and her great-grandmother was diagnosed with breast cancer at an older age.   GYNECOLOGIC HISTORY:  No LMP recorded. (Menstrual status: Chemotherapy).  She had an emergent C-section in 09/23/2018 for abruptio placenta at 30 weeks pregnancy, also has a history of eclampsia Menarche:    years old Age at first live birth: 33 years old [place] P: 3 LMP: 04/18/2019 Contraceptive: none HRT: none  Hysterectomy?: no BSO?: no   SOCIAL HISTORY: (Current as of August 2020) Terri Estes is currently unemployed. She previously worked at [employer]. She is currently engaged. Fiance [name] runs a Midwife and works other odd jobs. Daughter Demetrius Charity, age 66, was abused by her father at 60 months and has cerebral palsy. Son [name], age 72, has severe asthma and is developmentally disabled. Daughter Yetta Glassman was born on 09/23/2018 prematurely and remains on  oxygen at home.  The patient's aunt is currently staying with her and is helping with the children and providing general support   ADVANCED DIRECTIVES: not in place.    HEALTH MAINTENANCE: Social History   Tobacco Use  . Smoking status: Former Smoker    Packs/day: 0.50    Years: 5.00    Pack years: 2.50    Types: Cigarettes    Quit date: 04/10/2019    Years since quitting: 0.3  . Smokeless tobacco: Never Used  . Tobacco comment: trying to quit  Substance Use Topics  . Alcohol use: No  . Drug use: Yes    Frequency: 7.0 times per week    Types: Marijuana    Comment: subutex    Colonoscopy: never done  PAP: 05/10/2018  Bone density: never done Mammography: first performed following abnormal CT  No Known Allergies   OBJECTIVE: Young African-American woman who appears stated age  Vitals:   08/29/19 1253  BP: (!) 115/93  Pulse: (!) 107  Resp: 18  Temp: 97.8 F (36.6 C)  SpO2: 99%   Wt Readings from Last 3 Encounters:  08/29/19 202 lb (91.6 kg)  08/22/19 195 lb 3.2 oz (88.5 kg)  08/15/19 207 lb 3.2 oz (94 kg)   Body mass index is 39.45 kg/m.    ECOG FS:1 - Symptomatic but completely ambulatory  Ocular: Sclerae unicteric, pupils round and equal Ear-nose-throat: Wearing a mask Lungs no rales or rhonchi Heart regular rate and rhythm Abd soft, obese, nontender, positive bowel sounds MSK no focal spinal tenderness, no joint edema Neuro: non-focal, well-oriented, appropriate affect Breasts: The right breast continues to be more normal in size and texture.  The skin eczema noted on at is darker and flatter and in process of resolution.  The left breast is benign.  Right breast 08/15/2019       LAB RESULTS:  CMP     Component Value Date/Time   NA 141 08/29/2019 1235   NA 137 05/10/2018 1535   K 3.2 (L) 08/29/2019 1235   CL 104 08/29/2019 1235   CO2 28 08/29/2019 1235   GLUCOSE 105 (H) 08/29/2019 1235   BUN 6 08/29/2019 1235   BUN 7 05/10/2018 1535    CREATININE 0.80 08/29/2019 1235   CREATININE 0.81 07/03/2019 0919   CALCIUM 8.5 (L) 08/29/2019 1235   PROT 6.2 (L) 08/29/2019 1235   PROT 7.2 05/10/2018 1535   ALBUMIN 3.6 08/29/2019 1235   ALBUMIN 4.2 05/10/2018 1535   AST 15 08/29/2019 1235   AST 54 (H) 07/03/2019 0919   ALT 15 08/29/2019 1235   ALT 102 (H) 07/03/2019 0919   ALKPHOS 83 08/29/2019 1235   BILITOT <0.2 (L) 08/29/2019 1235   BILITOT <0.2 (L) 07/03/2019 0919  GFRNONAA >60 08/29/2019 1235   GFRNONAA >60 07/03/2019 0919   GFRAA >60 08/29/2019 1235   GFRAA >60 07/03/2019 0919    No results found for: TOTALPROTELP, ALBUMINELP, A1GS, A2GS, BETS, BETA2SER, GAMS, MSPIKE, SPEI  No results found for: KPAFRELGTCHN, LAMBDASER, [facility]  Lab Results  Component Value Date   WBC 6.4 08/29/2019   NEUTROABS 3.5 08/29/2019   HGB 7.7 (L) 08/29/2019   HCT 23.9 (L) 08/29/2019   MCV 103.0 (H) 08/29/2019   PLT 207 08/29/2019    _0 @  No results found for: LABCA2  No components found for: [id number]  No results for input(s): INR in the last 168 hours.  No results found for: LABCA2  No results found for: KCL275  No results found for: TZG017  No results found for: CBS496  No results found for: CA2729  No components found for: HGQUANT  No results found for: CEA1 / No results found for: CEA1   No results found for: AFPTUMOR  No results found for: CHROMOGRNA  No results found for: PSA1  Appointment on 08/29/2019  Component Date Value Ref Range Status  . Sodium 08/29/2019 141  135 - 145 mmol/L Final  . Potassium 08/29/2019 3.2* 3.5 - 5.1 mmol/L Final  . Chloride 08/29/2019 104  98 - 111 mmol/L Final  . CO2 08/29/2019 28  22 - 32 mmol/L Final  . Glucose, Bld 08/29/2019 105* 70 - 99 mg/dL Final  . BUN 08/29/2019 6  6 - 20 mg/dL Final  . Creatinine, Ser 08/29/2019 0.80  0.44 - 1.00 mg/dL Final  . Calcium 08/29/2019 8.5* 8.9 - 10.3 mg/dL Final  . Total Protein 08/29/2019 6.2* 6.5 - 8.1 g/dL Final   . Albumin 08/29/2019 3.6  3.5 - 5.0 g/dL Final  . AST 08/29/2019 15  15 - 41 U/L Final  . ALT 08/29/2019 15  0 - 44 U/L Final  . Alkaline Phosphatase 08/29/2019 83  38 - 126 U/L Final  . Total Bilirubin 08/29/2019 <0.2* 0.3 - 1.2 mg/dL Final  . GFR calc non Af Amer 08/29/2019 >60  >60 mL/min Final  . GFR calc Af Amer 08/29/2019 >60  >60 mL/min Final  . Anion gap 08/29/2019 9  5 - 15 Final   Performed at [facility], [place] [address], [place], [place] [address]  . WBC 08/29/2019 6.4  4.0 - 10.5 K/uL Final  . RBC 08/29/2019 2.32* 3.87 - 5.11 MIL/uL Final  . Hemoglobin 08/29/2019 7.7* 12.0 - 15.0 g/dL Final  . HCT 08/29/2019 23.9* 36.0 - 46.0 % Final  . MCV 08/29/2019 103.0* 80.0 - 100.0 fL Final  . MCH 08/29/2019 33.2  26.0 - 34.0 pg Final  . MCHC 08/29/2019 32.2  30.0 - 36.0 g/dL Final  . RDW 08/29/2019 17.7* 11.5 - 15.5 % Final  . Platelets 08/29/2019 207  150 - 400 K/uL Final  . nRBC 08/29/2019 2.7* 0.0 - 0.2 % Final  . Neutrophils Relative % 08/29/2019 55  % Final  . Neutro Abs 08/29/2019 3.5  1.7 - 7.7 K/uL Final  . Lymphocytes Relative 08/29/2019 18  % Final  . Lymphs Abs 08/29/2019 1.1  0.7 - 4.0 K/uL Final  . Monocytes Relative 08/29/2019 14  % Final  . Monocytes Absolute 08/29/2019 0.9  0.1 - 1.0 K/uL Final  . Eosinophils Relative 08/29/2019 0  % Final  . Eosinophils Absolute 08/29/2019 0.0  0.0 - 0.5 K/uL Final  . Basophils Relative 08/29/2019 0  % Final  . Basophils Absolute  08/29/2019 0.0  0.0 - 0.1 K/uL Final  . Immature Granulocytes 08/29/2019 13  % Final   Increased IG's, likely caused by Bone Marrow Colony Stimulating Factor received within 30 days.  . Abs Immature Granulocytes 08/29/2019 0.85* 0.00 - 0.07 K/uL Final  . Polychromasia 08/29/2019 PRESENT   Final   Performed at [facility], [place] [place], [place] [address]    (this displays the last labs from the last 3 days)  No results found for:  TOTALPROTELP, ALBUMINELP, A1GS, A2GS, BETS, BETA2SER, GAMS, MSPIKE, SPEI (this displays SPEP labs)  No results found for: KPAFRELGTCHN, LAMBDASER, KAPLAMBRATIO (kappa/lambda light chains)  Lab Results  Component Value Date   HGBA 97.4 12/16/2016   (Hemoglobinopathy evaluation)   No results found for: LDH  No results found for: IRON, TIBC, IRONPCTSAT (Iron and TIBC)  No results found for: FERRITIN  Urinalysis    Component Value Date/Time   COLORURINE YELLOW 06/26/2019 1300   APPEARANCEUR HAZY (A) 06/26/2019 1300   LABSPEC 1.019 06/26/2019 1300   PHURINE 6.0 06/26/2019 1300   GLUCOSEU NEGATIVE 06/26/2019 1300   HGBUR NEGATIVE 06/26/2019 1300   BILIRUBINUR NEGATIVE 06/26/2019 1300   KETONESUR NEGATIVE 06/26/2019 1300   PROTEINUR NEGATIVE 06/26/2019 1300   UROBILINOGEN 4.0 (H) 01/13/2018 1417   NITRITE NEGATIVE 06/26/2019 1300   LEUKOCYTESUR LARGE (A) 06/26/2019 1300     STUDIES:  No results found.   ELIGIBLE FOR AVAILABLE RESEARCH PROTOCOL: no   ASSESSMENT: 33 y.o. [place] woman status post right breast biopsy x2 and right axillary lymph node biopsy 04/19/2019 for a clinical T4 N2 MX, stage IIIC invasive ductal carcinoma, grade 3, triple negative, with an MIB-1 of 90%  (a) chest CT scan 04/11/2019 shows multiple subcentimeter lung nodules  (b) PET scan denied by insurance  (c) CT A/P and bone scan due 05/09/2019 show a very small low-density lesion in the lateral segment of the left liver which is indeterminate, and sclerosis of the right SI joint, which is felt to be likely degenerative; bone scan was entirely negative.  (c) baseline breast MRI 04/30/2019 shows involvement of all 4 quadrants in the right breast as well as right axillary subpectoral and internal mammary lymphadenopathy.  Left breast was unremarkable  (1) neoadjuvant chemotherapy consisting of  (a) pembrolizumab started 05/03/2019, third dose delayed because of elevated liver function tests, resumed  08/02/2019  (b) paclitaxel weekly x12 with carboplatin every 21 days x 4, started 05/08/2019   (i) tolerated carboplatin AUC 5-day 1 poorly, switched to AUC 2 on day 1 day 8   (ii) cycle 2 delayed 1 week because of elevated liver function tests   (iii) carboplatin and paclitaxel discontinued after 07/03/2019 because of neuropathy, (received 7 weekly doses paclitaxel and one AUC5 carbo dose, four AUC2 doses)  (c) cyclophosphamide and doxorubicin x4 started 07/03/2019, completed 08/29/2019   (i) 2d dose given day 28 due to poor tolerance; other doses Q 21 days   (2) definitive surgery pending  (3) adjuvant radiation to follow  (4) genetics testing  (a) genetic testing 05/14/2019 through the Common Hereditary Gene Panel offered by [facility] found no deleterious mutations in APC, ATM, AXIN2, BARD1, BMPR1A, BRCA1, BRCA2, BRIP1, CDH1, CDK4, CDKN2A (p14ARF), CDKN2A (p16INK4a), CHEK2, CTNNA1, DICER1, EPCAM (Deletion/duplication testing only), GREM1 (promoter region deletion/duplication testing only), KIT, MEN1, MLH1, MSH2, MSH3, MSH6, MUTYH, NBN, NF1, NHTL1, PALB2, PDGFRA, PMS2, POLD1, POLE, PTEN, RAD50, RAD51C, RAD51D, RNF43, SDHB, SDHC, SDHD, SMAD4, SMARCA4. STK11, TP53, TSC1, TSC2, and VHL.  The following genes were evaluated for sequence changes only: SDHA and HOXB13 c.251G>A variant only  (5) goserelin started 05/08/2019  (6) pembrolizumab to be continued and minimum of 1 year (through May 2021)   PLAN: Terri Estes completes her neoadjuvant chemotherapy today.  It has been a real effort on her part to receive it and even to show up for the treatments and she deserves a lot of credit for that.  I am hopeful she will get the results that she deserves.  She is already scheduled for breast MRI 09/07/2019.  She I have sent a note to Dr. Lucia Gaskins alerting him that she will be ready for definitive surgery anytime after that.  She will return to see me on 09/12/2019 for her next Keytruda dose and we will review  the MRI results at that time as well.  She will need postmastectomy radiation.  We are continuing the goserelin indefinitely and the pembrolizumab for a minimum of 1 year.  She is anemic today and we are trying to transfuse 1 unit if we can fit her in.   , [name], MD  08/29/19 1:23 PM Medical Oncology and Hematology [facility] [address] [place], [place] [address] Tel. [phone]    Fax. [phone]  I, Jacqualyn Posey am acting as a Education administrator for Chauncey Cruel, MD.   I, Lurline Del MD, have reviewed the above documentation for accuracy and completeness, and I agree with the above.

## 2019-08-29 ENCOUNTER — Encounter: Payer: Self-pay | Admitting: General Practice

## 2019-08-29 ENCOUNTER — Other Ambulatory Visit: Payer: Self-pay | Admitting: *Deleted

## 2019-08-29 ENCOUNTER — Telehealth: Payer: Self-pay | Admitting: *Deleted

## 2019-08-29 ENCOUNTER — Inpatient Hospital Stay: Payer: Medicaid Other | Attending: Oncology

## 2019-08-29 ENCOUNTER — Encounter: Payer: Self-pay | Admitting: *Deleted

## 2019-08-29 ENCOUNTER — Other Ambulatory Visit: Payer: Self-pay

## 2019-08-29 ENCOUNTER — Inpatient Hospital Stay: Payer: Medicaid Other

## 2019-08-29 ENCOUNTER — Inpatient Hospital Stay (HOSPITAL_BASED_OUTPATIENT_CLINIC_OR_DEPARTMENT_OTHER): Payer: Medicaid Other | Admitting: Oncology

## 2019-08-29 ENCOUNTER — Inpatient Hospital Stay: Payer: Medicaid Other | Admitting: Nutrition

## 2019-08-29 VITALS — BP 115/93 | HR 107 | Temp 97.8°F | Resp 18 | Ht 60.0 in | Wt 202.0 lb

## 2019-08-29 DIAGNOSIS — Z808 Family history of malignant neoplasm of other organs or systems: Secondary | ICD-10-CM | POA: Insufficient documentation

## 2019-08-29 DIAGNOSIS — D649 Anemia, unspecified: Secondary | ICD-10-CM | POA: Diagnosis not present

## 2019-08-29 DIAGNOSIS — M542 Cervicalgia: Secondary | ICD-10-CM | POA: Insufficient documentation

## 2019-08-29 DIAGNOSIS — M79646 Pain in unspecified finger(s): Secondary | ICD-10-CM | POA: Diagnosis not present

## 2019-08-29 DIAGNOSIS — Z171 Estrogen receptor negative status [ER-]: Secondary | ICD-10-CM | POA: Diagnosis not present

## 2019-08-29 DIAGNOSIS — D72819 Decreased white blood cell count, unspecified: Secondary | ICD-10-CM | POA: Diagnosis not present

## 2019-08-29 DIAGNOSIS — Z5111 Encounter for antineoplastic chemotherapy: Secondary | ICD-10-CM | POA: Insufficient documentation

## 2019-08-29 DIAGNOSIS — Z833 Family history of diabetes mellitus: Secondary | ICD-10-CM | POA: Insufficient documentation

## 2019-08-29 DIAGNOSIS — G629 Polyneuropathy, unspecified: Secondary | ICD-10-CM | POA: Diagnosis not present

## 2019-08-29 DIAGNOSIS — C50811 Malignant neoplasm of overlapping sites of right female breast: Secondary | ICD-10-CM

## 2019-08-29 DIAGNOSIS — R918 Other nonspecific abnormal finding of lung field: Secondary | ICD-10-CM | POA: Diagnosis not present

## 2019-08-29 DIAGNOSIS — M79676 Pain in unspecified toe(s): Secondary | ICD-10-CM | POA: Insufficient documentation

## 2019-08-29 DIAGNOSIS — Z87891 Personal history of nicotine dependence: Secondary | ICD-10-CM | POA: Diagnosis not present

## 2019-08-29 DIAGNOSIS — F1290 Cannabis use, unspecified, uncomplicated: Secondary | ICD-10-CM | POA: Diagnosis not present

## 2019-08-29 DIAGNOSIS — Z79899 Other long term (current) drug therapy: Secondary | ICD-10-CM | POA: Diagnosis not present

## 2019-08-29 DIAGNOSIS — R21 Rash and other nonspecific skin eruption: Secondary | ICD-10-CM | POA: Diagnosis not present

## 2019-08-29 DIAGNOSIS — Z803 Family history of malignant neoplasm of breast: Secondary | ICD-10-CM | POA: Insufficient documentation

## 2019-08-29 DIAGNOSIS — R590 Localized enlarged lymph nodes: Secondary | ICD-10-CM | POA: Insufficient documentation

## 2019-08-29 DIAGNOSIS — Z841 Family history of disorders of kidney and ureter: Secondary | ICD-10-CM | POA: Diagnosis not present

## 2019-08-29 DIAGNOSIS — Z800 Family history of malignant neoplasm of digestive organs: Secondary | ICD-10-CM | POA: Insufficient documentation

## 2019-08-29 DIAGNOSIS — Z5112 Encounter for antineoplastic immunotherapy: Secondary | ICD-10-CM | POA: Diagnosis not present

## 2019-08-29 LAB — COMPREHENSIVE METABOLIC PANEL
ALT: 15 U/L (ref 0–44)
AST: 15 U/L (ref 15–41)
Albumin: 3.6 g/dL (ref 3.5–5.0)
Alkaline Phosphatase: 83 U/L (ref 38–126)
Anion gap: 9 (ref 5–15)
BUN: 6 mg/dL (ref 6–20)
CO2: 28 mmol/L (ref 22–32)
Calcium: 8.5 mg/dL — ABNORMAL LOW (ref 8.9–10.3)
Chloride: 104 mmol/L (ref 98–111)
Creatinine, Ser: 0.8 mg/dL (ref 0.44–1.00)
GFR calc Af Amer: >60 mL/min (ref >60)
GFR calc non Af Amer: >60 mL/min (ref >60)
Glucose, Bld: 105 mg/dL — ABNORMAL HIGH (ref 70–99)
Potassium: 3.2 mmol/L — ABNORMAL LOW (ref 3.5–5.1)
Sodium: 141 mmol/L (ref 135–145)
Total Bilirubin: <0.2 mg/dL — ABNORMAL LOW (ref 0.3–1.2)
Total Protein: 6.2 g/dL — ABNORMAL LOW (ref 6.5–8.1)

## 2019-08-29 LAB — CBC WITH DIFFERENTIAL/PLATELET
Abs Immature Granulocytes: 0.85 10*3/uL — ABNORMAL HIGH (ref 0.00–0.07)
Basophils Absolute: 0 10*3/uL (ref 0.0–0.1)
Basophils Relative: 0 %
Eosinophils Absolute: 0 10*3/uL (ref 0.0–0.5)
Eosinophils Relative: 0 %
HCT: 23.9 % — ABNORMAL LOW (ref 36.0–46.0)
Hemoglobin: 7.7 g/dL — ABNORMAL LOW (ref 12.0–15.0)
Immature Granulocytes: 13 %
Lymphocytes Relative: 18 %
Lymphs Abs: 1.1 10*3/uL (ref 0.7–4.0)
MCH: 33.2 pg (ref 26.0–34.0)
MCHC: 32.2 g/dL (ref 30.0–36.0)
MCV: 103 fL — ABNORMAL HIGH (ref 80.0–100.0)
Monocytes Absolute: 0.9 10*3/uL (ref 0.1–1.0)
Monocytes Relative: 14 %
Neutro Abs: 3.5 10*3/uL (ref 1.7–7.7)
Neutrophils Relative %: 55 %
Platelets: 207 10*3/uL (ref 150–400)
RBC: 2.32 MIL/uL — ABNORMAL LOW (ref 3.87–5.11)
RDW: 17.7 % — ABNORMAL HIGH (ref 11.5–15.5)
WBC: 6.4 10*3/uL (ref 4.0–10.5)
nRBC: 2.7 % — ABNORMAL HIGH (ref 0.0–0.2)

## 2019-08-29 LAB — PREPARE RBC (CROSSMATCH)

## 2019-08-29 MED ORDER — DOXORUBICIN HCL CHEMO IV INJECTION 2 MG/ML
60.0000 mg/m2 | Freq: Once | INTRAVENOUS | Status: AC
  Administered 2019-08-29: 15:00:00 120 mg via INTRAVENOUS
  Filled 2019-08-29: qty 60

## 2019-08-29 MED ORDER — PEGFILGRASTIM 6 MG/0.6ML ~~LOC~~ PSKT
PREFILLED_SYRINGE | SUBCUTANEOUS | Status: AC
  Filled 2019-08-29: qty 0.6

## 2019-08-29 MED ORDER — HEPARIN SOD (PORK) LOCK FLUSH 100 UNIT/ML IV SOLN
500.0000 [IU] | Freq: Once | INTRAVENOUS | Status: AC | PRN
  Administered 2019-08-29: 500 [IU]
  Filled 2019-08-29: qty 5

## 2019-08-29 MED ORDER — SODIUM CHLORIDE 0.9% FLUSH
10.0000 mL | INTRAVENOUS | Status: DC | PRN
  Administered 2019-08-29: 10 mL
  Filled 2019-08-29: qty 10

## 2019-08-29 MED ORDER — SODIUM CHLORIDE 0.9 % IV SOLN
INTRAVENOUS | Status: DC
  Administered 2019-08-29: 15:00:00 via INTRAVENOUS
  Filled 2019-08-29: qty 250

## 2019-08-29 MED ORDER — PEGFILGRASTIM 6 MG/0.6ML ~~LOC~~ PSKT
6.0000 mg | PREFILLED_SYRINGE | Freq: Once | SUBCUTANEOUS | Status: AC
  Administered 2019-08-29: 6 mg via SUBCUTANEOUS

## 2019-08-29 MED ORDER — SODIUM CHLORIDE 0.9 % IV SOLN
Freq: Once | INTRAVENOUS | Status: AC
  Administered 2019-08-29: 14:00:00 via INTRAVENOUS
  Filled 2019-08-29: qty 5

## 2019-08-29 MED ORDER — SODIUM CHLORIDE 0.9 % IV SOLN
Freq: Once | INTRAVENOUS | Status: AC
  Administered 2019-08-29: 14:00:00 via INTRAVENOUS
  Filled 2019-08-29: qty 250

## 2019-08-29 MED ORDER — PALONOSETRON HCL INJECTION 0.25 MG/5ML
0.2500 mg | Freq: Once | INTRAVENOUS | Status: AC
  Administered 2019-08-29: 0.25 mg via INTRAVENOUS

## 2019-08-29 MED ORDER — SODIUM CHLORIDE 0.9 % IV SOLN
600.0000 mg/m2 | Freq: Once | INTRAVENOUS | Status: AC
  Administered 2019-08-29: 1200 mg via INTRAVENOUS
  Filled 2019-08-29: qty 60

## 2019-08-29 MED ORDER — PALONOSETRON HCL INJECTION 0.25 MG/5ML
INTRAVENOUS | Status: AC
  Filled 2019-08-29: qty 5

## 2019-08-29 NOTE — Progress Notes (Signed)
Patient will return for blood transfusion tomorrow, 9/3 at 0800. She will also receive Zoladex, as she forgot EMLA cream today, and requested to wait until tomorrow.

## 2019-08-29 NOTE — Progress Notes (Signed)
[facility] CSW Progress Notes  Met w patient in infusion room to continue to monitor progress on goals of stable housing, mental health care, financial support, adjustment to demands of illness.  Pt reports that she "won" her case in court and eviction was stopped.  She was assisted by Legal Aid.  However, landlord had 10 days in which to file another eviction due to unpaid rent.  Pt was advised to vacate her apartment rather than risk another eviction.  Moved to hotel w family, is paying for this out of limited income.  Has paid up through Sat Sept 5th.  Aunt from out of state is currently in town and helping care for children while pt is in treatment at [facility].  Pt continues to look for suitable apartment/house - wants 3 bedrooms.  Is on list for consideration for 2 bedroom unit. Hopeful she will find something soon.  Wants to have this in place before upcoming surgery.    Has not completed intake process w [facility].  Has not been able to schedule appointment due to multiple appts for herself and children.  Called Crystal at [facility] will attempt to schedule visit for 9/7 or 8.    Is receiving disabilty for herself and oldest daughter.  Youngest daughter has application pending.  Is trying to reach RN CM Wayna Chalet for help w filing for middle son w vision impairments.    Discussed need for flexibility in housing choices - having stable 2 BR may be only choice due to housing scarcity.  Pt given information on [employer] - program for transitioning families which has long wait list.  Pt and family could benefit from this program.  CSW will also contact RN CM Barbaraann Faster to discuss future plans - pt is completing chemotherapy today and visits to [facility] will decrease as a result.  [name], LCSW Clinical Social Worker Phone:  [phone]

## 2019-08-29 NOTE — Telephone Encounter (Signed)
Per MD proceed with treatment today as planned despite noted pulse of 118 and heme of 7.5.  Pt will need 1 UPRBCs today or tomorrow. Orders placed and infusion room nurse notified.

## 2019-08-29 NOTE — Patient Instructions (Addendum)
[facility] Discharge Instructions for Patients Receiving Chemotherapy  Today you received the following chemotherapy agents: Doxorubicin, Cytoxan  To help prevent nausea and vomiting after your treatment, we encourage you to take your nausea medication as directed.    If you develop nausea and vomiting that is not controlled by your nausea medication, call the clinic.   BELOW ARE SYMPTOMS THAT SHOULD BE REPORTED IMMEDIATELY:  *FEVER GREATER THAN 100.5 F  *CHILLS WITH OR WITHOUT FEVER  NAUSEA AND VOMITING THAT IS NOT CONTROLLED WITH YOUR NAUSEA MEDICATION  *UNUSUAL SHORTNESS OF BREATH  *UNUSUAL BRUISING OR BLEEDING  TENDERNESS IN MOUTH AND THROAT WITH OR WITHOUT PRESENCE OF ULCERS  *URINARY PROBLEMS  *BOWEL PROBLEMS  UNUSUAL RASH Items with * indicate a potential emergency and should be followed up as soon as possible.  Feel free to call the clinic should you have any questions or concerns. The clinic phone number is [phone].  Please show the [facility] at check-in to the Emergency Department and triage nurse.  **REMOVE NEULASTA DEVICE AT 8PM ON Thursday 08/30/19

## 2019-08-29 NOTE — Progress Notes (Signed)
33 year old female diagnosed with triple A negative breast cancer. She is a patient of Dr. Jana Hakim.  Past medical history includes depression, anxiety, and substance abuse.  Medications include Compazine, Questran, Prilosec, Zofran, prenatal multivitamin.  Labs were reviewed.  Height: 5 feet 0 inches. Weight: 202 pounds. Usual body weight: 203 pounds July 24. BMI: 39.45.  Patient reports she has poor appetite after treatment. She describes taste alterations. Noted weight and appetite have improved as she got farther out from chemotherapy. She is lactose intolerant.  Nutrition diagnosis:  Food and nutrition related knowledge deficit related to breast cancer and associated treatments as evidenced by no prior need for nutrition related information.  Intervention: Educated patient on the importance of smaller more frequent meals and snacks with adequate calories and protein. Educated patient on strategies to improve taste alterations. Reviewed importance of adequate protein and protein sources in diet. Fact sheets were provided.  Contact information was given.  Questions were answered and teach back was used.  Monitoring, evaluation, goals: Patient will tolerate adequate calories and protein to minimize loss of lean body mass.  Nutrition diagnosis resolved.  No follow-up scheduled.  **Disclaimer: This note was dictated with voice recognition software. Similar sounding words can inadvertently be transcribed and this note may contain transcription errors which may not have been corrected upon publication of note.**

## 2019-08-30 ENCOUNTER — Inpatient Hospital Stay: Payer: Medicaid Other

## 2019-08-30 ENCOUNTER — Other Ambulatory Visit: Payer: Self-pay

## 2019-08-30 VITALS — BP 116/76 | HR 76 | Temp 97.8°F | Resp 20

## 2019-08-30 DIAGNOSIS — Z5112 Encounter for antineoplastic immunotherapy: Secondary | ICD-10-CM | POA: Diagnosis not present

## 2019-08-30 DIAGNOSIS — Z171 Estrogen receptor negative status [ER-]: Secondary | ICD-10-CM

## 2019-08-30 DIAGNOSIS — C50811 Malignant neoplasm of overlapping sites of right female breast: Secondary | ICD-10-CM

## 2019-08-30 DIAGNOSIS — D649 Anemia, unspecified: Secondary | ICD-10-CM

## 2019-08-30 MED ORDER — GOSERELIN ACETATE 3.6 MG ~~LOC~~ IMPL
DRUG_IMPLANT | SUBCUTANEOUS | Status: AC
  Filled 2019-08-30: qty 3.6

## 2019-08-30 MED ORDER — SODIUM CHLORIDE 0.9% IV SOLUTION
250.0000 mL | Freq: Once | INTRAVENOUS | Status: AC
  Administered 2019-08-30: 250 mL via INTRAVENOUS
  Filled 2019-08-30: qty 250

## 2019-08-30 MED ORDER — GOSERELIN ACETATE 3.6 MG ~~LOC~~ IMPL
3.6000 mg | DRUG_IMPLANT | Freq: Once | SUBCUTANEOUS | Status: AC
  Administered 2019-08-30: 08:00:00 3.6 mg via SUBCUTANEOUS

## 2019-08-30 MED ORDER — ACETAMINOPHEN 325 MG PO TABS
650.0000 mg | ORAL_TABLET | Freq: Once | ORAL | Status: AC
  Administered 2019-08-30: 08:00:00 650 mg via ORAL

## 2019-08-30 MED ORDER — DIPHENHYDRAMINE HCL 25 MG PO CAPS
25.0000 mg | ORAL_CAPSULE | Freq: Once | ORAL | Status: AC
  Administered 2019-08-30: 25 mg via ORAL

## 2019-08-30 MED ORDER — DIPHENHYDRAMINE HCL 25 MG PO CAPS
ORAL_CAPSULE | ORAL | Status: AC
  Filled 2019-08-30: qty 1

## 2019-08-30 MED ORDER — ACETAMINOPHEN 325 MG PO TABS
ORAL_TABLET | ORAL | Status: AC
  Filled 2019-08-30: qty 2

## 2019-08-30 MED ORDER — HEPARIN SOD (PORK) LOCK FLUSH 100 UNIT/ML IV SOLN
250.0000 [IU] | INTRAVENOUS | Status: AC | PRN
  Administered 2019-08-30: 500 [IU]
  Filled 2019-08-30: qty 5

## 2019-08-30 MED ORDER — SODIUM CHLORIDE 0.9% FLUSH
10.0000 mL | INTRAVENOUS | Status: AC | PRN
  Administered 2019-08-30: 11:00:00 10 mL
  Filled 2019-08-30: qty 10

## 2019-08-30 NOTE — Patient Instructions (Addendum)
Goserelin injection What is this medicine? GOSERELIN (GOE se rel in) is similar to a hormone found in the body. It lowers the amount of sex hormones that the body makes. Men will have lower testosterone levels and women will have lower estrogen levels while taking this medicine. In men, this medicine is used to treat prostate cancer; the injection is either given once per month or once every 12 weeks. A once per month injection (only) is used to treat women with endometriosis, dysfunctional uterine bleeding, or advanced breast cancer. This medicine may be used for other purposes; ask your health care provider or pharmacist if you have questions. COMMON BRAND NAME(S): Zoladex What should I tell my health care provider before I take this medicine? They need to know if you have any of these conditions:  bone problems  diabetes  heart disease  history of irregular heartbeat  an unusual or allergic reaction to goserelin, other medicines, foods, dyes, or preservatives  pregnant or trying to get pregnant  breast-feeding How should I use this medicine? This medicine is for injection under the skin. It is given by a health care professional in a hospital or clinic setting. Talk to your pediatrician regarding the use of this medicine in children. Special care may be needed. Overdosage: If you think you have taken too much of this medicine contact a poison control center or emergency room at once. NOTE: This medicine is only for you. Do not share this medicine with others. What if I miss a dose? It is important not to miss your dose. Call your doctor or health care professional if you are unable to keep an appointment. What may interact with this medicine? Do not take this medicine with any of the following medications:  cisapride  dronedarone  pimozide  thioridazine This medicine may also interact with the following medications:  other medicines that prolong the QT interval (an abnormal  heart rhythm) This list may not describe all possible interactions. Give your health care provider a list of all the medicines, herbs, non-prescription drugs, or dietary supplements you use. Also tell them if you smoke, drink alcohol, or use illegal drugs. Some items may interact with your medicine. What should I watch for while using this medicine? Visit your doctor or health care provider for regular checks on your progress. Your symptoms may appear to get worse during the first weeks of this therapy. Tell your doctor or healthcare provider if your symptoms do not start to get better or if they get worse after this time. Your bones may get weaker if you take this medicine for a long time. If you smoke or frequently drink alcohol you may increase your risk of bone loss. A family history of osteoporosis, chronic use of drugs for seizures (convulsions), or corticosteroids can also increase your risk of bone loss. Talk to your doctor about how to keep your bones strong. This medicine should stop regular monthly menstruation in women. Tell your doctor if you continue to menstruate. Women should not become pregnant while taking this medicine or for 12 weeks after stopping this medicine. Women should inform their doctor if they wish to become pregnant or think they might be pregnant. There is a potential for serious side effects to an unborn child. Talk to your health care professional or pharmacist for more information. Do not breast-feed an infant while taking this medicine. Men should inform their doctors if they wish to father a child. This medicine may lower sperm counts. Talk   to your health care professional or pharmacist for more information. This medicine may increase blood sugar. Ask your healthcare provider if changes in diet or medicines are needed if you have diabetes. What side effects may I notice from receiving this medicine? Side effects that you should report to your doctor or health care  professional as soon as possible:  allergic reactions like skin rash, itching or hives, swelling of the face, lips, or tongue  bone pain  breathing problems  changes in vision  chest pain  feeling faint or lightheaded, falls  fever, chills  pain, swelling, warmth in the leg  pain, tingling, numbness in the hands or feet  signs and symptoms of high blood sugar such as being more thirsty or hungry or having to urinate more than normal. You may also feel very tired or have blurry vision  signs and symptoms of low blood pressure like dizziness; feeling faint or lightheaded, falls; unusually weak or tired  stomach pain  swelling of the ankles, feet, hands  trouble passing urine or change in the amount of urine  unusually high or low blood pressure  unusually weak or tired Side effects that usually do not require medical attention (report to your doctor or health care professional if they continue or are bothersome):  change in sex drive or performance  changes in breast size in both males and females  changes in emotions or moods  headache  hot flashes  irritation at site where injected  loss of appetite  skin problems like acne, dry skin  vaginal dryness This list may not describe all possible side effects. Call your doctor for medical advice about side effects. You may report side effects to FDA at 1-800-FDA-1088. Where should I keep my medicine? This drug is given in a hospital or clinic and will not be stored at home. NOTE: This sheet is a summary. It may not cover all possible information. If you have questions about this medicine, talk to your doctor, pharmacist, or health care provider.  2020 Elsevier/Gold Standard (2019-04-02 14:05:56)    Blood Transfusion, Adult, Care After This sheet gives you information about how to care for yourself after your procedure. Your doctor may also give you more specific instructions. If you have problems or questions,  contact your doctor. Follow these instructions at home:   Take over-the-counter and prescription medicines only as told by your doctor.  Go back to your normal activities as told by your doctor.  Follow instructions from your doctor about how to take care of the area where an IV tube was put into your vein (insertion site). Make sure you: ? Wash your hands with soap and water before you change your bandage (dressing). If there is no soap and water, use hand sanitizer. ? Change your bandage as told by your doctor.  Check your IV insertion site every day for signs of infection. Check for: ? More redness, swelling, or pain. ? More fluid or blood. ? Warmth. ? Pus or a bad smell. Contact a doctor if:  You have more redness, swelling, or pain around the IV insertion site.  You have more fluid or blood coming from the IV insertion site.  Your IV insertion site feels warm to the touch.  You have pus or a bad smell coming from the IV insertion site.  Your pee (urine) turns pink, red, or brown.  You feel weak after doing your normal activities. Get help right away if:  You have signs of  a serious allergic or body defense (immune) system reaction, including: ? Itchiness. ? Hives. ? Trouble breathing. ? Anxiety. ? Pain in your chest or lower back. ? Fever, flushing, and chills. ? Fast pulse. ? Rash. ? Watery poop (diarrhea). ? Throwing up (vomiting). ? Dark pee. ? Serious headache. ? Dizziness. ? Stiff neck. ? Yellow color in your face or the white parts of your eyes (jaundice). Summary  After a blood transfusion, return to your normal activities as told by your doctor.  Every day, check for signs of infection where the IV tube was put into your vein.  Some signs of infection are warm skin, more redness and pain, more fluid or blood, and pus or a bad smell where the needle went in.  Contact your doctor if you feel weak or have any unusual symptoms. This information is not  intended to replace advice given to you by your health care provider. Make sure you discuss any questions you have with your health care provider. Document Released: 01/03/2015 Document Revised: 04/19/2018 Document Reviewed: 08/06/2016 Elsevier Patient Education  2020 [employer].

## 2019-08-31 LAB — TYPE AND SCREEN
ABO/RH(D): A POS
Antibody Screen: NEGATIVE
Unit division: 0

## 2019-08-31 LAB — BPAM RBC
Blood Product Expiration Date: 202009252359
ISSUE DATE / TIME: 202009030855
Unit Type and Rh: 6200

## 2019-09-04 NOTE — Progress Notes (Signed)
[name] was scheduled for a follow-up visit today but she was not feeling well, called and canceled.  She is already scheduled to see me after her upcoming MRI.

## 2019-09-05 ENCOUNTER — Inpatient Hospital Stay (HOSPITAL_BASED_OUTPATIENT_CLINIC_OR_DEPARTMENT_OTHER): Payer: Medicaid Other | Admitting: Oncology

## 2019-09-05 DIAGNOSIS — Z171 Estrogen receptor negative status [ER-]: Secondary | ICD-10-CM

## 2019-09-05 DIAGNOSIS — C50811 Malignant neoplasm of overlapping sites of right female breast: Secondary | ICD-10-CM

## 2019-09-07 ENCOUNTER — Inpatient Hospital Stay: Admission: RE | Admit: 2019-09-07 | Payer: Medicaid Other | Source: Ambulatory Visit

## 2019-09-07 ENCOUNTER — Telehealth: Payer: Self-pay | Admitting: *Deleted

## 2019-09-07 MED ORDER — FLUCONAZOLE 100 MG PO TABS: 100.0000 mg | ORAL_TABLET | Freq: Every day | ORAL | 0 refills | Status: DC

## 2019-09-07 NOTE — Telephone Encounter (Signed)
This RN called [name] to follow up per message received from the [place]- that the pt was a no show for MRI of her breast today.  Per discussion [name] stated " I know - I forgot - I am just still not feeling good and forgot "  [name] has phone number to reschedule and will do.  Per inquiry regarding how she is feeling- she states " my mouth and throat are sore- I had my aunt look at it and she says it white but there are any sores "  This RN discussed above and plan for medication ( diflucan to be called in ) pharmacy verified.  This RN also inquired with pt per her social situation and current residence.  [name] states she will be leaving the motel after today and " be staying at my dad's in [place] " She states she will be there " until the house is ready which they keep telling me - in 2 weeks "  She states she cannot continue to stay at the motel because " it is too expensive "  [name] otherwise has no other needs at this time.  Appointment for 9/16 reviewed as well as plan for [name] [facility].

## 2019-09-08 ENCOUNTER — Other Ambulatory Visit: Payer: Self-pay | Admitting: Adult Health

## 2019-09-08 DIAGNOSIS — C50811 Malignant neoplasm of overlapping sites of right female breast: Secondary | ICD-10-CM

## 2019-09-11 ENCOUNTER — Ambulatory Visit
Admission: RE | Admit: 2019-09-11 | Discharge: 2019-09-11 | Disposition: A | Discharge status: Home / Self Care | Payer: Medicaid Other | Source: Ambulatory Visit | Attending: Oncology | Admitting: Oncology

## 2019-09-11 DIAGNOSIS — C50811 Malignant neoplasm of overlapping sites of right female breast: Secondary | ICD-10-CM

## 2019-09-11 DIAGNOSIS — Z171 Estrogen receptor negative status [ER-]: Secondary | ICD-10-CM

## 2019-09-11 DIAGNOSIS — G620 Drug-induced polyneuropathy: Secondary | ICD-10-CM

## 2019-09-11 MED ORDER — GADOBUTROL 1 MMOL/ML IV SOLN
9.0000 mL | Freq: Once | INTRAVENOUS | Status: AC | PRN
  Administered 2019-09-11: 9 mL via INTRAVENOUS

## 2019-09-11 NOTE — Progress Notes (Signed)
[place]  Telephone:([phone]) [phone] Fax:([phone]) [phone]    ID: Terri Estes DOB: [birth year]  MR#: [id number]  EXB#:[id number]  Patient Care Team: Patient, No Pcp Per as PCP - General (General Practice) Alphonsa Overall, MD as Consulting Physician (General Surgery) Ritter Helsley, [name], MD as Consulting Physician (Oncology) Kyung Rudd, MD as Consulting Physician (Radiation Oncology) Rockwell Germany, RN as Oncology Nurse Navigator Mauro Kaufmann, RN as Oncology Nurse Navigator Donnamae Jude, MD as Consulting Physician (Obstetrics and Gynecology) Pavelock, Ralene Bathe, MD as Consulting Physician (Internal Medicine) OTHER MD: Joylene Igo for [facility] is [phone]   CHIEF COMPLAINT: Triple negative breast cancer  CURRENT TREATMENT: pembrolizumab, goserelin   INTERVAL HISTORY: Terri Estes returns today for follow-up and treatment of her estrogen negative breast cancer.   She continues on pembrolizumab every 21 days, with a dose due today.  She is tolerating this well now although originally she did develop a rash from it.  She also receives goserelin every 4 weeks, with her most recent dose 2 weeks ago..  She has no problems from that medication.  Since her last visit here, she underwent bilateral breat MRI yesterday, 09/11/2019.  This showed a significant although not a complete radiologic response.  There is enough improvement though that she can have her modified radical mastectomy hopefully with clear margins   REVIEW OF SYSTEMS: Terri Estes continues to be very stressed, not having a home of her own, and staying with relatives.  She is working on this and apparently [employer] is prioritizing her situation.  She has some peeling of her fingertips and pain in her fingertips and toe tips.  She is taking gabapentin at bedtime and that is helping her sleep.  She has not tried it yet taking it during the day.  A detailed review of systems today was otherwise  stable   HISTORY OF CURRENT ILLNESS: From the original intake note:  Terri Estes presented with swelling along the left side of the face with neck pain. She then underwent a neck CT on 04/11/2019 showing: Enlarged left level 2 lymph nodes with associated inflammatory change including stranding of the adjacent fat and thickening of the platysma. While malignancy is not excluded, this is most concerning for infection. No focal abscess is present. Hypoattenuation within 1 of the left level 2 lymph nodes likely represents central necrosis or suppuration of the node. No primary malignancy or other abscess. Left upper lobe pulmonary nodules may be inflammatory. The largest measures 0.7 x 0.7 x 0.6 cm. CT of the chest with contrast may be useful for further evaluation.  She also underwent a chest CT on the same day showing: Complex right breast mass measuring 5.6 x 4.5 x 4.7 cm consistent with a primary breast carcinoma. Malignant right axillary adenopathy measuring up to 3.1 cm. Multiple bilateral pulmonary nodules are concerning for metastatic disease. Given the right breast mass, the left cervical lymph nodes are more likely malignant.  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  Accordingly on 04/19/2019 she proceeded to biopsy of the right breast area in question. The pathology from this procedure showed ([id number]): invasive ductal carcinoma, grade III, upper inner quadrant, 12:30 o'clock, 5.0 cm from the nipple. Prognostic indicators significant for: estrogen receptor,  0% negative and progesterone receptor, 0% negative. Proliferation marker Ki67 at 90%. HER2 negative  (0+).  Additional biopsies of the right breast and right axilla were performed on the same day. The pathology from this procedure showed ([id number]): 2. Breast, right, needle core biopsy, satellite mass UOQ, 10 o'clock, 8cmfn  - Invasive ductal carcinoma, grade III 3. Lymph node, needle/core biopsy, level 1 right axilla   - Invasive ductal carcinoma, grade III  The patient's subsequent history is as detailed below.   PAST MEDICAL HISTORY: Past Medical History:  Diagnosis Date   Anemia    after pregnancy   Anxiety    severe not taking meds   Asthma    inhaler  4x per day   Chronic pain    Family history of breast cancer    History of substance abuse ([place])    Pregnancy induced hypertension    takes medication now.   Psoriasis    Trichomonas infection    Vaginal Pap smear, abnormal      PAST SURGICAL HISTORY: Past Surgical History:  Procedure Laterality Date   CESAREAN SECTION  05/26/2008   CESAREAN SECTION N/A 07/10/2017   Procedure: CESAREAN SECTION;  Surgeon: Florian Buff, MD;  Location: [facility];  Service: Obstetrics;  Laterality: N/A;   CESAREAN SECTION N/A 09/23/2018   Procedure: CESAREAN SECTION;  Surgeon: Donnamae Jude, MD;  Location: [facility];  Service: Obstetrics;  Laterality: N/A;   COLPOSCOPY     PORTACATH PLACEMENT Left 04/30/2019   Procedure: INSERTION PORT-A-CATH WITH ULTRASOUND;  Surgeon: Alphonsa Overall, MD;  Location: [facility];  Service: General;  Laterality: Left;  Left Subclavian vein   WISDOM TOOTH EXTRACTION       FAMILY HISTORY: Family History  Problem Relation Age of Onset   Kidney disease Maternal Grandmother    Breast cancer Maternal Grandmother 28       metastatic to liver; d. 32   Kidney disease Maternal Grandfather    Kidney disease Paternal Grandmother    Diabetes Paternal Grandmother    Breast cancer Paternal Grandmother    Kidney disease Paternal Grandfather    Breast cancer Paternal Aunt     Cerebral palsy Child    Breast cancer Paternal Great-grandmother    Pancreatic cancer Other        PGMs brother    Throat cancer Paternal Uncle    Breast cancer Other        PGMs sister  (Updated 04/25/2019) Terri Estes's father is living at age 58. Patients' mother is also living at age 59. [place] notes, however, that she was raised by her grandmother.) The patient has 0 brothers and 5 sisters. Patient denies anyone in her family having ovarian, prostate, or pancreatic cancer. Her maternal grandmother was diagnosed with breast cancer, unsure what age. On her father's side, she reports her grandmother had cysts in her breasts, her great-aunt might have had breast cancer, and her great-grandmother was diagnosed with breast cancer at an older age.   GYNECOLOGIC HISTORY:  No LMP recorded. (Menstrual status: Chemotherapy).  She had an emergent C-section in 09/23/2018 for abruptio placenta at 30 weeks pregnancy, also has a history of eclampsia Menarche:    years old Age at first live birth: 33 years old [place] P: 3 LMP: 04/18/2019 Contraceptive: none HRT: none  Hysterectomy?: no BSO?: no   SOCIAL HISTORY: (Current as of August 2020) Terri Estes is currently unemployed. She previously worked at [employer]. She is currently engaged. 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 Daughter Demetrius Charity, age 82, was abused by her father at 57 months and has cerebral palsy. Son [name], age 58, has severe asthma and is developmentally disabled. Daughter Yetta Glassman was born on 09/23/2018 prematurely and remains on oxygen at home.  The patient's aunt is currently staying with her and is helping with the children and providing general support   ADVANCED DIRECTIVES: not in place.    HEALTH MAINTENANCE: Social History   Tobacco Use   Smoking status: Former Smoker    Packs/day: 0.50    Years: 5.00    Pack years: 2.50    Types: Cigarettes    Quit date: 04/10/2019    Years since  quitting: 0.4   Smokeless tobacco: Never Used   Tobacco comment: trying to quit  Substance Use Topics   Alcohol use: No   Drug use: Yes    Frequency: 7.0 times per week    Types: Marijuana    Comment: subutex    Colonoscopy: never done  PAP: 05/10/2018  Bone density: never done Mammography: first performed following abnormal CT  No Known Allergies   OBJECTIVE: Young African-American woman examined in the treatment area  There were no vitals filed for this visit. Wt Readings from Last 3 Encounters:  08/29/19 202 lb (91.6 kg)  08/22/19 195 lb 3.2 oz (88.5 kg)  08/15/19 207 lb 3.2 oz (94 kg)   There is no height or weight on file to calculate BMI.   For vitals today please see the treatment area flowsheet. ECOG FS:1 - Symptomatic but completely ambulatory  Right breast 08/15/2019       LAB RESULTS:  CMP     Component Value Date/Time   NA 142 09/12/2019 1215   NA 137 05/10/2018 1535   K 3.3 (L) 09/12/2019 1215   CL 104 09/12/2019 1215   CO2 26 09/12/2019 1215   GLUCOSE 101 (H) 09/12/2019 1215   BUN 6 09/12/2019 1215   BUN 7 05/10/2018 1535   CREATININE 0.84 09/12/2019 1215   CREATININE 0.81 07/03/2019 0919   CALCIUM 8.9 09/12/2019 1215   PROT 6.6 09/12/2019 1215   PROT 7.2 05/10/2018 1535   ALBUMIN 4.0 09/12/2019 1215   ALBUMIN 4.2 05/10/2018 1535   AST 17 09/12/2019 1215   AST 54 (H) 07/03/2019 0919   ALT 14 09/12/2019 1215   ALT 102 (H) 07/03/2019 0919   ALKPHOS 86 09/12/2019 1215   BILITOT <0.2 (L) 09/12/2019 1215   BILITOT <0.2 (L) 07/03/2019 0919   GFRNONAA >60 09/12/2019 1215   GFRNONAA >60 07/03/2019 0919   GFRAA >60 09/12/2019 1215   GFRAA >60 07/03/2019 0919    No results found for: TOTALPROTELP, ALBUMINELP, A1GS, A2GS, BETS, BETA2SER, GAMS, MSPIKE, SPEI  No results found for: KPAFRELGTCHN, LAMBDASER, KAPLAMBRATIO  Lab Results  Component Value Date   WBC 2.3 (L) 09/12/2019   NEUTROABS 1.0 (L) 09/12/2019   HGB 8.7 (L) 09/12/2019    HCT 25.7 (L) 09/12/2019   MCV 97.7 09/12/2019   PLT 249 09/12/2019    _0 @  No results found for: LABCA2  No components found for: TDSKAJ681  No results for input(s): INR in the last 168 hours.  No results found for: LABCA2  No results found for: LXB262  No results found for: MBT597  No results found for: CBU384  No results found for: CA2729  No components found for: HGQUANT  No results found for: CEA1 / No results found for: CEA1  No results found for: AFPTUMOR  No results found for: CHROMOGRNA  No results found for: PSA1  Appointment on 09/12/2019  Component Date Value Ref Range Status   TSH 09/12/2019 0.235* 0.308 - 3.960 uIU/mL Final   Performed at [facility], [place] [address], [place], [place] [address]   Ferritin 09/12/2019 900* 11 - 307 ng/mL Final   Performed at [facility], [place] [address] [place], [place] [address]   Sodium 09/12/2019 142  135 - 145 mmol/L Final   Potassium 09/12/2019 3.3* 3.5 - 5.1 mmol/L Final   Chloride 09/12/2019 104  98 - 111 mmol/L Final   CO2 09/12/2019 26  22 - 32 mmol/L Final   Glucose, Bld 09/12/2019 101* 70 - 99 mg/dL Final   BUN 09/12/2019 6  6 - 20 mg/dL Final   Creatinine, Ser 09/12/2019 0.84  0.44 - 1.00 mg/dL Final   Calcium 09/12/2019 8.9  8.9 - 10.3 mg/dL Final   Total Protein 09/12/2019 6.6  6.5 - 8.1 g/dL Final   Albumin 09/12/2019 4.0  3.5 - 5.0 g/dL Final   AST 09/12/2019 17  15 - 41 U/L Final   ALT 09/12/2019 14  0 - 44 U/L Final   Alkaline Phosphatase 09/12/2019 86  38 - 126 U/L Final   Total Bilirubin 09/12/2019 <0.2* 0.3 - 1.2 mg/dL Final   GFR calc non Af Amer 09/12/2019 >60  >60 mL/min Final   GFR calc Af Amer 09/12/2019 >60  >60 mL/min Final   Anion gap 09/12/2019 12  5 - 15 Final   Performed at [facility], [place] [address] [place], [place] [address]   WBC 09/12/2019 2.3* 4.0 - 10.5 K/uL Final    RBC 09/12/2019 2.63* 3.87 - 5.11 MIL/uL Final   Hemoglobin 09/12/2019 8.7* 12.0 - 15.0 g/dL Final   HCT 09/12/2019 25.7* 36.0 - 46.0 % Final   MCV 09/12/2019 97.7  80.0 - 100.0 fL Final   MCH 09/12/2019 33.1  26.0 - 34.0 pg Final   MCHC 09/12/2019 33.9  30.0 - 36.0 g/dL Final   RDW 09/12/2019 16.8* 11.5 - 15.5 % Final   Platelets 09/12/2019 249  150 - 400 K/uL Final   nRBC 09/12/2019 1.8* 0.0 - 0.2 % Final   Neutrophils Relative % 09/12/2019 44  % Final   Neutro Abs 09/12/2019 1.0* 1.7 - 7.7 K/uL Final   Lymphocytes Relative 09/12/2019 32  % Final   Lymphs Abs 09/12/2019 0.7  0.7 - 4.0 K/uL Final   Monocytes Relative 09/12/2019 22  % Final   Monocytes Absolute 09/12/2019 0.5  0.1 - 1.0 K/uL Final   Eosinophils Relative 09/12/2019 0  % Final   Eosinophils Absolute 09/12/2019 0.0  0.0 - 0.5 K/uL Final   Basophils Relative 09/12/2019 1  % Final   Basophils Absolute 09/12/2019 0.0  0.0 - 0.1 K/uL Final   Immature Granulocytes 09/12/2019 1  % Final   Abs Immature Granulocytes 09/12/2019 0.03  0.00 - 0.07 K/uL Final   Polychromasia 09/12/2019 PRESENT   Final   Performed at [facility], [place] [address], [place], [place] [address]    (this displays the last labs from the last 3 days)  No results found for: TOTALPROTELP, ALBUMINELP, A1GS, A2GS, BETS, BETA2SER, GAMS, MSPIKE, SPEI (this displays SPEP labs)  No results found for: KPAFRELGTCHN, LAMBDASER, KAPLAMBRATIO (kappa/lambda light chains)  Lab Results  Component Value Date   HGBA 97.4 12/16/2016   (  Hemoglobinopathy evaluation)   No results found for: LDH  No results found for: IRON, TIBC, IRONPCTSAT (Iron and TIBC)  Lab Results  Component Value Date   FERRITIN 900 (H) 09/12/2019    Urinalysis    Component Value Date/Time   COLORURINE YELLOW 06/26/2019 1300   APPEARANCEUR HAZY (A) 06/26/2019 1300   LABSPEC 1.019 06/26/2019 1300   PHURINE 6.0 06/26/2019 1300   GLUCOSEU  NEGATIVE 06/26/2019 1300   HGBUR NEGATIVE 06/26/2019 1300   BILIRUBINUR NEGATIVE 06/26/2019 1300   KETONESUR NEGATIVE 06/26/2019 1300   PROTEINUR NEGATIVE 06/26/2019 1300   UROBILINOGEN 4.0 (H) 01/13/2018 1417   NITRITE NEGATIVE 06/26/2019 1300   LEUKOCYTESUR LARGE (A) 06/26/2019 1300     STUDIES:  Mr Breast Bilateral W Wo Contrast Inc Cad  Addendum Date: 09/12/2019   ADDENDUM REPORT: 09/12/2019 12:39 ADDENDUM: The previously seen suspicious right pectoralis muscle enhancement has resolved. There is persistent superficial T2 signal along the anterior aspect of the muscle which may represent post treatment change. The size of the right pectoralis muscle is now symmetric compared to the left. There are no remaining suspicious internal mammary chain or juxta cardiac lymph nodes. Electronically Signed   By: Audie Pinto M.D.   On: 09/12/2019 12:39   Result Date: 09/12/2019 CLINICAL DATA:  History of multi centric inflammatory right breast cancer with metastatic right axillary lymph nodes. Patient is status post neoadjuvant chemotherapy. LABS:  None obtained EXAM: BILATERAL BREAST MRI WITH AND WITHOUT CONTRAST TECHNIQUE: Multiplanar, multisequence MR images of both breasts were obtained prior to and following the intravenous administration of 9 ml of Gadavist Three-dimensional MR images were rendered by post-processing of the original MR data on an independent workstation. The three-dimensional MR images were interpreted, and findings are reported in the following complete MRI report for this study. Three dimensional images were evaluated at the independent DynaCad workstation COMPARISON:  Previous exam(s). FINDINGS: Breast composition: b. Scattered fibroglandular tissue. Background parenchymal enhancement: Moderate. Right breast: Interval decrease in size of the dominant centrally necrotic rim enhancing mass in the right breast measuring 2.7 x 2.2 x 1.9 cm (series 6, image 75) abutting this is a  smaller similar appearing rim enhancing mass measuring 1.0 x 1.0 cm x 1.0 cm (series 6, image 78). There are several additional remaining tiny enhancing nodules in the vicinity of the dominant mass measuring up to 4 mm (for example series 6, image 75) within the upper central and upper outer breast. Overall significant decrease in the right breast skin enhancement. The 2 previously described rim enhancing masses within the skin in the anterior breast have decreased, now a small residual focus of enhancement measuring 5 mm (series 6, image 60). There are a few additional small scattered foci of skin enhancement along the medial and anterior aspect of the breast (for example series 6, image 77). Overall significant debulking of the previously seen non mass enhancement extending throughout all 4 quadrants of the breast. However there remains a large area of asymmetric non mass enhancement measuring at least 10.1 x 8.1 cm which could represent a combination of residual disease and or post treatment affect. Left breast: No new mass or abnormal enhancement. Lymph nodes: Interval significant decrease in of multiple previously seen enlarged right axillary lymph nodes. There is 1 remaining abnormal appearing lymph node, previously the largest lymph node seen, now measuring 1.0 x 1.2 cm (series 3, image 22), previously measuring 3.1 x 2.6 cm. Ancillary findings:  Left chest Port-A-Cath in place. IMPRESSION: 1. Status  post neoadjuvant therapy for right multicentric inflammatory breast cancer. Interval significant improvement in skin enhancement, as well as disease involving all four quadrants of the breast. There remains a centrally necrotic mass measuring up to 2.7 cm with additional tiny likely satellite nodules in the vicinity. There are residual small scattered areas of skin enhancement. Overall significant debulking of non-mass enhancement seen throughout the breast however a large area measuring up to 10 cm continues to  enhance asymmetrically compared to the left side, possibly representing a combination of residual disease and/or treatment effect. 2. Interval significant decrease in size of multiple enlarged right axillary lymph nodes. There is a remaining abnormal mildly enlarged right axillary lymph node measuring 1.0 cm. 3. No new or suspicious findings in the left breast. RECOMMENDATION: Continued treatment planning and surgical management for the known right breast carcinoma. BI-RADS CATEGORY  6: Known biopsy-proven malignancy. Electronically Signed: By: Audie Pinto M.D. On: 09/12/2019 11:14     ELIGIBLE FOR AVAILABLE RESEARCH PROTOCOL: no   ASSESSMENT: 33 y.o. [place] woman status post right breast biopsy x2 and right axillary lymph node biopsy 04/19/2019 for a clinical T4 N2 MX, stage IIIC invasive ductal carcinoma, grade 3, triple negative, with an MIB-1 of 90%  (a) chest CT scan 04/11/2019 shows multiple subcentimeter lung nodules  (b) PET scan denied by insurance  (c) CT A/P and bone scan due 05/09/2019 show a very small low-density lesion in the lateral segment of the left liver which is indeterminate, and sclerosis of the right SI joint, which is felt to be likely degenerative; bone scan was entirely negative.  (c) baseline breast MRI 04/30/2019 shows involvement of all 4 quadrants in the right breast as well as right axillary subpectoral and internal mammary lymphadenopathy.  Left breast was unremarkable  (1) neoadjuvant chemotherapy consisting of  (a) pembrolizumab started 05/03/2019, third dose delayed because of elevated liver function tests, resumed 08/02/2019  (b) paclitaxel weekly x12 with carboplatin every 21 days x 4, started 05/08/2019   (i) tolerated carboplatin AUC 5-day 1 poorly, switched to AUC 2 on day 1 day 8   (ii) cycle 2 delayed 1 week because of elevated liver function tests   (iii) carboplatin and paclitaxel discontinued after 07/03/2019 because of neuropathy, (received 7  weekly doses paclitaxel and one AUC5 carbo dose, four AUC2 doses)  (c) cyclophosphamide and doxorubicin x4 started 07/03/2019, completed 08/29/2019   (i) 2d dose given day 28 due to poor tolerance; other doses Q 21 days   (2) definitive surgery pending  (3) adjuvant radiation to follow, with capecitabine sensitization  (4) genetics testing  (a) genetic testing 05/14/2019 through the Common Hereditary Gene Panel offered by [facility] found no deleterious mutations in APC, ATM, AXIN2, BARD1, BMPR1A, BRCA1, BRCA2, BRIP1, CDH1, CDK4, CDKN2A (p14ARF), CDKN2A (p16INK4a), CHEK2, CTNNA1, DICER1, EPCAM (Deletion/duplication testing only), GREM1 (promoter region deletion/duplication testing only), KIT, MEN1, MLH1, MSH2, MSH3, MSH6, MUTYH, NBN, NF1, NHTL1, PALB2, PDGFRA, PMS2, POLD1, POLE, PTEN, RAD50, RAD51C, RAD51D, RNF43, SDHB, SDHC, SDHD, SMAD4, SMARCA4. STK11, TP53, TSC1, TSC2, and VHL.  The following genes were evaluated for sequence changes only: SDHA and HOXB13 c.251G>A variant only  (5) goserelin started 05/08/2019  (6) pembrolizumab to be continued and minimum of 1 year (through May 2021)   PLAN: Terri Estes is still anemic and leukopenic from her recent treatment but hopefully with a little bit more time these problems will continue to improve.  She still has neuropathy but again I am hoping with more time this will improve  as well.  In the meantime she will take gabapentin 3 times a day so long as it does not make her too sleepy.  I showed her the images of the MRI, which show considerable improvement although not complete resolution, and I also gave her a copy of the written report.  Given her significant transportation problems I am going to arrange it so that she can receive her pembrolizumab and goserelin at the same time.  Basically were going to stretch the pembrolizumab to every 4 weeks, continuing at the same dose is currently.  After surgery of course she will need radiation which we will  couple with capecitabine.  She will also need to be completely restaged at that time.  There are some disability issues which she will discuss with our social work staff  Incidentally I also dropped her enalapril to 5 mg daily given her low blood pressure today.  She knows to call for any other issue that may develop before the next visit.  Sher Hellinger, [name], MD  09/12/19 2:12 PM Medical Oncology and Hematology [facility] [address] [place], [place] [address] Tel. [phone]    Fax. [phone]  I, Jacqualyn Posey am acting as a Education administrator for Chauncey Cruel, MD.   I, Lurline Del MD, have reviewed the above documentation for accuracy and completeness, and I agree with the above.

## 2019-09-12 ENCOUNTER — Other Ambulatory Visit: Payer: Self-pay

## 2019-09-12 ENCOUNTER — Inpatient Hospital Stay (HOSPITAL_BASED_OUTPATIENT_CLINIC_OR_DEPARTMENT_OTHER): Payer: Medicaid Other | Admitting: Oncology

## 2019-09-12 ENCOUNTER — Inpatient Hospital Stay: Payer: Medicaid Other

## 2019-09-12 ENCOUNTER — Encounter: Payer: Self-pay | Admitting: General Practice

## 2019-09-12 VITALS — BP 95/68 | HR 110 | Temp 97.8°F | Resp 18

## 2019-09-12 DIAGNOSIS — T451X5A Adverse effect of antineoplastic and immunosuppressive drugs, initial encounter: Secondary | ICD-10-CM

## 2019-09-12 DIAGNOSIS — G620 Drug-induced polyneuropathy: Secondary | ICD-10-CM

## 2019-09-12 DIAGNOSIS — C50811 Malignant neoplasm of overlapping sites of right female breast: Secondary | ICD-10-CM | POA: Diagnosis not present

## 2019-09-12 DIAGNOSIS — Z95828 Presence of other vascular implants and grafts: Secondary | ICD-10-CM

## 2019-09-12 DIAGNOSIS — Z5112 Encounter for antineoplastic immunotherapy: Secondary | ICD-10-CM | POA: Diagnosis not present

## 2019-09-12 DIAGNOSIS — Z171 Estrogen receptor negative status [ER-]: Secondary | ICD-10-CM | POA: Diagnosis not present

## 2019-09-12 LAB — CBC WITH DIFFERENTIAL/PLATELET
Abs Immature Granulocytes: 0.03 10*3/uL (ref 0.00–0.07)
Basophils Absolute: 0 10*3/uL (ref 0.0–0.1)
Basophils Relative: 1 %
Eosinophils Absolute: 0 10*3/uL (ref 0.0–0.5)
Eosinophils Relative: 0 %
HCT: 25.7 % — ABNORMAL LOW (ref 36.0–46.0)
Hemoglobin: 8.7 g/dL — ABNORMAL LOW (ref 12.0–15.0)
Immature Granulocytes: 1 %
Lymphocytes Relative: 32 %
Lymphs Abs: 0.7 10*3/uL (ref 0.7–4.0)
MCH: 33.1 pg (ref 26.0–34.0)
MCHC: 33.9 g/dL (ref 30.0–36.0)
MCV: 97.7 fL (ref 80.0–100.0)
Monocytes Absolute: 0.5 10*3/uL (ref 0.1–1.0)
Monocytes Relative: 22 %
Neutro Abs: 1 10*3/uL — ABNORMAL LOW (ref 1.7–7.7)
Neutrophils Relative %: 44 %
Platelets: 249 10*3/uL (ref 150–400)
RBC: 2.63 MIL/uL — ABNORMAL LOW (ref 3.87–5.11)
RDW: 16.8 % — ABNORMAL HIGH (ref 11.5–15.5)
WBC: 2.3 10*3/uL — ABNORMAL LOW (ref 4.0–10.5)
nRBC: 1.8 % — ABNORMAL HIGH (ref 0.0–0.2)

## 2019-09-12 LAB — COMPREHENSIVE METABOLIC PANEL
ALT: 14 U/L (ref 0–44)
AST: 17 U/L (ref 15–41)
Albumin: 4 g/dL (ref 3.5–5.0)
Alkaline Phosphatase: 86 U/L (ref 38–126)
Anion gap: 12 (ref 5–15)
BUN: 6 mg/dL (ref 6–20)
CO2: 26 mmol/L (ref 22–32)
Calcium: 8.9 mg/dL (ref 8.9–10.3)
Chloride: 104 mmol/L (ref 98–111)
Creatinine, Ser: 0.84 mg/dL (ref 0.44–1.00)
GFR calc Af Amer: >60 mL/min (ref >60)
GFR calc non Af Amer: >60 mL/min (ref >60)
Glucose, Bld: 101 mg/dL — ABNORMAL HIGH (ref 70–99)
Potassium: 3.3 mmol/L — ABNORMAL LOW (ref 3.5–5.1)
Sodium: 142 mmol/L (ref 135–145)
Total Bilirubin: <0.2 mg/dL — ABNORMAL LOW (ref 0.3–1.2)
Total Protein: 6.6 g/dL (ref 6.5–8.1)

## 2019-09-12 LAB — FERRITIN: Ferritin: 900 ng/mL — ABNORMAL HIGH (ref 11–307)

## 2019-09-12 LAB — TSH: TSH: 0.235 u[IU]/mL — ABNORMAL LOW (ref 0.308–3.960)

## 2019-09-12 MED ORDER — FAMOTIDINE IN NACL 20-0.9 MG/50ML-% IV SOLN
20.0000 mg | Freq: Once | INTRAVENOUS | Status: AC
  Administered 2019-09-12: 20 mg via INTRAVENOUS

## 2019-09-12 MED ORDER — ENALAPRIL MALEATE 5 MG PO TABS: 5.0000 mg | ORAL_TABLET | Freq: Every day | ORAL | 4 refills | Status: DC

## 2019-09-12 MED ORDER — FAMOTIDINE IN NACL 20-0.9 MG/50ML-% IV SOLN
INTRAVENOUS | Status: AC
  Filled 2019-09-12: qty 50

## 2019-09-12 MED ORDER — SODIUM CHLORIDE 0.9% FLUSH
10.0000 mL | INTRAVENOUS | Status: DC | PRN
  Administered 2019-09-12: 10 mL
  Filled 2019-09-12: qty 10

## 2019-09-12 MED ORDER — SODIUM CHLORIDE 0.9 % IV SOLN
200.0000 mg | Freq: Once | INTRAVENOUS | Status: AC
  Administered 2019-09-12: 200 mg via INTRAVENOUS
  Filled 2019-09-12: qty 8

## 2019-09-12 MED ORDER — DIPHENHYDRAMINE HCL 50 MG/ML IJ SOLN
INTRAMUSCULAR | Status: AC
  Filled 2019-09-12: qty 1

## 2019-09-12 MED ORDER — SODIUM CHLORIDE 0.9 % IV SOLN
Freq: Once | INTRAVENOUS | Status: AC
  Administered 2019-09-12: 14:00:00 via INTRAVENOUS
  Filled 2019-09-12: qty 250

## 2019-09-12 MED ORDER — HEPARIN SOD (PORK) LOCK FLUSH 100 UNIT/ML IV SOLN
500.0000 [IU] | Freq: Once | INTRAVENOUS | Status: AC | PRN
  Administered 2019-09-12: 500 [IU]
  Filled 2019-09-12: qty 5

## 2019-09-12 MED ORDER — SODIUM CHLORIDE 0.9% FLUSH
10.0000 mL | INTRAVENOUS | Status: DC | PRN
  Administered 2019-09-12: 12:00:00 10 mL via INTRAVENOUS
  Filled 2019-09-12: qty 10

## 2019-09-12 MED ORDER — DIPHENHYDRAMINE HCL 50 MG/ML IJ SOLN
25.0000 mg | Freq: Once | INTRAMUSCULAR | Status: AC
  Administered 2019-09-12: 25 mg via INTRAVENOUS

## 2019-09-12 MED ORDER — ONDANSETRON HCL 8 MG PO TABS: ORAL_TABLET | ORAL | 0 refills | Status: DC

## 2019-09-12 NOTE — Progress Notes (Signed)
[place] CSW Progress Notes  Spoke w patient in infusion - she is now living w a friend in [place] and will commute to [facility] appointments while searching for suitable housing for her family.  Provided copy of denial letter from [employer] - was denied benefits as [employer] did not deem her condition disabling for more than 12 months.  W patients permission, secure emailed copy of letter to [facility] for further assistance.  As stated in previous note, also provided patient w gas cards to assist in getting to treatment.  [name], LCSW Clinical Social Worker Phone:  [phone]

## 2019-09-12 NOTE — Progress Notes (Signed)
[place] CSW Progress Notes  Patient now living in [place] w family members while awaiting suitable housing for family in [place].  Gave $50 [employer] card to assist w transportation/food needs.  Can request this 3 mor times to a total of $200.  [name], LCSW Clinical Social Worker Phone:  [phone]

## 2019-09-12 NOTE — Progress Notes (Signed)
Per Dr. Jana Hakim, okay for patient to receive treatment today with ANC 1.0 and pulse 110.

## 2019-09-12 NOTE — Patient Instructions (Signed)
[facility] Discharge Instructions for Patients Receiving Chemotherapy  Today you received the following chemotherapy agents:  Keytruda.  To help prevent nausea and vomiting after your treatment, we encourage you to take your nausea medication as directed.   If you develop nausea and vomiting that is not controlled by your nausea medication, call the clinic.   BELOW ARE SYMPTOMS THAT SHOULD BE REPORTED IMMEDIATELY:  *FEVER GREATER THAN 100.5 F  *CHILLS WITH OR WITHOUT FEVER  NAUSEA AND VOMITING THAT IS NOT CONTROLLED WITH YOUR NAUSEA MEDICATION  *UNUSUAL SHORTNESS OF BREATH  *UNUSUAL BRUISING OR BLEEDING  TENDERNESS IN MOUTH AND THROAT WITH OR WITHOUT PRESENCE OF ULCERS  *URINARY PROBLEMS  *BOWEL PROBLEMS  UNUSUAL RASH Items with * indicate a potential emergency and should be followed up as soon as possible.  Feel free to call the clinic should you have any questions or concerns. The clinic phone number is [phone].  Please show the CHEMO ALERT CARD at check-in to the Emergency Department and triage nurse.    

## 2019-09-13 ENCOUNTER — Telehealth: Payer: Self-pay | Admitting: Oncology

## 2019-09-13 NOTE — Telephone Encounter (Signed)
I talk with patient regarding schedule  

## 2019-09-14 ENCOUNTER — Encounter: Payer: Self-pay | Admitting: General Practice

## 2019-09-14 NOTE — Progress Notes (Signed)
[place] CSW Progress Notes  Per [place], patient has missed several scheduled appointments for intake for services.  No showed for scheduled appointment today.  Agency will not continue to reach out to client, client must initiate contact.  [name], LCSW Clinical Social Worker Phone:  [phone]

## 2019-10-02 ENCOUNTER — Other Ambulatory Visit: Payer: Self-pay

## 2019-10-02 ENCOUNTER — Other Ambulatory Visit: Payer: Self-pay | Admitting: *Deleted

## 2019-10-02 ENCOUNTER — Inpatient Hospital Stay: Payer: Medicaid Other

## 2019-10-02 ENCOUNTER — Encounter: Payer: Self-pay | Admitting: General Practice

## 2019-10-02 ENCOUNTER — Inpatient Hospital Stay: Payer: Medicaid Other | Attending: Oncology

## 2019-10-02 ENCOUNTER — Inpatient Hospital Stay (HOSPITAL_BASED_OUTPATIENT_CLINIC_OR_DEPARTMENT_OTHER): Payer: Medicaid Other | Admitting: Oncology

## 2019-10-02 VITALS — BP 98/76 | HR 97 | Temp 97.8°F | Resp 18 | Ht 60.0 in | Wt 195.8 lb

## 2019-10-02 DIAGNOSIS — F1290 Cannabis use, unspecified, uncomplicated: Secondary | ICD-10-CM | POA: Insufficient documentation

## 2019-10-02 DIAGNOSIS — R7989 Other specified abnormal findings of blood chemistry: Secondary | ICD-10-CM | POA: Insufficient documentation

## 2019-10-02 DIAGNOSIS — Z5111 Encounter for antineoplastic chemotherapy: Secondary | ICD-10-CM | POA: Diagnosis present

## 2019-10-02 DIAGNOSIS — C50811 Malignant neoplasm of overlapping sites of right female breast: Secondary | ICD-10-CM

## 2019-10-02 DIAGNOSIS — Z808 Family history of malignant neoplasm of other organs or systems: Secondary | ICD-10-CM | POA: Insufficient documentation

## 2019-10-02 DIAGNOSIS — Z5112 Encounter for antineoplastic immunotherapy: Secondary | ICD-10-CM | POA: Insufficient documentation

## 2019-10-02 DIAGNOSIS — Z800 Family history of malignant neoplasm of digestive organs: Secondary | ICD-10-CM | POA: Diagnosis not present

## 2019-10-02 DIAGNOSIS — Z6838 Body mass index (BMI) 38.0-38.9, adult: Secondary | ICD-10-CM | POA: Insufficient documentation

## 2019-10-02 DIAGNOSIS — Z87891 Personal history of nicotine dependence: Secondary | ICD-10-CM | POA: Diagnosis not present

## 2019-10-02 DIAGNOSIS — G629 Polyneuropathy, unspecified: Secondary | ICD-10-CM | POA: Diagnosis not present

## 2019-10-02 DIAGNOSIS — Z820 Family history of epilepsy and other diseases of the nervous system: Secondary | ICD-10-CM | POA: Diagnosis not present

## 2019-10-02 DIAGNOSIS — Z841 Family history of disorders of kidney and ureter: Secondary | ICD-10-CM | POA: Diagnosis not present

## 2019-10-02 DIAGNOSIS — Z171 Estrogen receptor negative status [ER-]: Secondary | ICD-10-CM | POA: Insufficient documentation

## 2019-10-02 DIAGNOSIS — J45909 Unspecified asthma, uncomplicated: Secondary | ICD-10-CM | POA: Diagnosis not present

## 2019-10-02 DIAGNOSIS — Z79899 Other long term (current) drug therapy: Secondary | ICD-10-CM | POA: Insufficient documentation

## 2019-10-02 DIAGNOSIS — C773 Secondary and unspecified malignant neoplasm of axilla and upper limb lymph nodes: Secondary | ICD-10-CM | POA: Diagnosis not present

## 2019-10-02 DIAGNOSIS — Z803 Family history of malignant neoplasm of breast: Secondary | ICD-10-CM | POA: Insufficient documentation

## 2019-10-02 DIAGNOSIS — Z833 Family history of diabetes mellitus: Secondary | ICD-10-CM | POA: Diagnosis not present

## 2019-10-02 LAB — COMPREHENSIVE METABOLIC PANEL
ALT: 14 U/L (ref 0–44)
AST: 19 U/L (ref 15–41)
Albumin: 3.9 g/dL (ref 3.5–5.0)
Alkaline Phosphatase: 75 U/L (ref 38–126)
Anion gap: 10 (ref 5–15)
BUN: 7 mg/dL (ref 6–20)
CO2: 26 mmol/L (ref 22–32)
Calcium: 9.2 mg/dL (ref 8.9–10.3)
Chloride: 105 mmol/L (ref 98–111)
Creatinine, Ser: 0.77 mg/dL (ref 0.44–1.00)
GFR calc Af Amer: >60 mL/min (ref >60)
GFR calc non Af Amer: >60 mL/min (ref >60)
Glucose, Bld: 96 mg/dL (ref 70–99)
Potassium: 3.2 mmol/L — ABNORMAL LOW (ref 3.5–5.1)
Sodium: 141 mmol/L (ref 135–145)
Total Bilirubin: 0.3 mg/dL (ref 0.3–1.2)
Total Protein: 6.6 g/dL (ref 6.5–8.1)

## 2019-10-02 LAB — TSH: TSH: 0.241 u[IU]/mL — ABNORMAL LOW (ref 0.308–3.960)

## 2019-10-02 LAB — CBC WITH DIFFERENTIAL/PLATELET
Abs Immature Granulocytes: 0.01 10*3/uL (ref 0.00–0.07)
Basophils Absolute: 0 10*3/uL (ref 0.0–0.1)
Basophils Relative: 1 %
Eosinophils Absolute: 0 10*3/uL (ref 0.0–0.5)
Eosinophils Relative: 1 %
HCT: 31.5 % — ABNORMAL LOW (ref 36.0–46.0)
Hemoglobin: 10.3 g/dL — ABNORMAL LOW (ref 12.0–15.0)
Immature Granulocytes: 0 %
Lymphocytes Relative: 23 %
Lymphs Abs: 1 10*3/uL (ref 0.7–4.0)
MCH: 33 pg (ref 26.0–34.0)
MCHC: 32.7 g/dL (ref 30.0–36.0)
MCV: 101 fL — ABNORMAL HIGH (ref 80.0–100.0)
Monocytes Absolute: 0.5 10*3/uL (ref 0.1–1.0)
Monocytes Relative: 12 %
Neutro Abs: 2.8 10*3/uL (ref 1.7–7.7)
Neutrophils Relative %: 63 %
Platelets: 243 10*3/uL (ref 150–400)
RBC: 3.12 MIL/uL — ABNORMAL LOW (ref 3.87–5.11)
RDW: 16.4 % — ABNORMAL HIGH (ref 11.5–15.5)
WBC: 4.4 10*3/uL (ref 4.0–10.5)
nRBC: 0 % (ref 0.0–0.2)

## 2019-10-02 MED ORDER — HEPARIN SOD (PORK) LOCK FLUSH 100 UNIT/ML IV SOLN
500.0000 [IU] | Freq: Once | INTRAVENOUS | Status: AC | PRN
  Administered 2019-10-02: 16:00:00 500 [IU]
  Filled 2019-10-02: qty 5

## 2019-10-02 MED ORDER — SODIUM CHLORIDE 0.9 % IV SOLN
Freq: Once | INTRAVENOUS | Status: AC
  Administered 2019-10-02: 14:00:00 via INTRAVENOUS
  Filled 2019-10-02: qty 250

## 2019-10-02 MED ORDER — SODIUM CHLORIDE 0.9 % IV SOLN
200.0000 mg | Freq: Once | INTRAVENOUS | Status: AC
  Administered 2019-10-02: 15:00:00 200 mg via INTRAVENOUS
  Filled 2019-10-02: qty 8

## 2019-10-02 MED ORDER — GOSERELIN ACETATE 3.6 MG ~~LOC~~ IMPL
DRUG_IMPLANT | SUBCUTANEOUS | Status: AC
  Filled 2019-10-02: qty 3.6

## 2019-10-02 MED ORDER — FAMOTIDINE IN NACL 20-0.9 MG/50ML-% IV SOLN
INTRAVENOUS | Status: AC
  Filled 2019-10-02: qty 50

## 2019-10-02 MED ORDER — FAMOTIDINE IN NACL 20-0.9 MG/50ML-% IV SOLN
20.0000 mg | Freq: Once | INTRAVENOUS | Status: AC
  Administered 2019-10-02: 20 mg via INTRAVENOUS

## 2019-10-02 MED ORDER — DIPHENHYDRAMINE HCL 50 MG/ML IJ SOLN
INTRAMUSCULAR | Status: AC
  Filled 2019-10-02: qty 1

## 2019-10-02 MED ORDER — DIPHENHYDRAMINE HCL 50 MG/ML IJ SOLN
25.0000 mg | Freq: Once | INTRAMUSCULAR | Status: AC
  Administered 2019-10-02: 14:00:00 25 mg via INTRAVENOUS

## 2019-10-02 MED ORDER — SODIUM CHLORIDE 0.9% FLUSH
10.0000 mL | INTRAVENOUS | Status: DC | PRN
  Administered 2019-10-02: 10 mL
  Filled 2019-10-02: qty 10

## 2019-10-02 MED ORDER — GOSERELIN ACETATE 3.6 MG ~~LOC~~ IMPL
3.6000 mg | DRUG_IMPLANT | Freq: Once | SUBCUTANEOUS | Status: AC
  Administered 2019-10-02: 3.6 mg via SUBCUTANEOUS

## 2019-10-02 NOTE — Progress Notes (Signed)
[facility] CSW Progress Notes  Email from [place] Dept - patient now living w maternal grandmother in [place], hoping to get into rental home w Terri Estes using housing voucher.  Terri Estes had appt w [facility], family running late and notified provider - upon arrival, appt was cancelled due to late arrival.  Another appt has been made for tomorrow - RN CM is trying to notify patient of date/time.  Children have many medical needs - per RN CM, mother is frustrated and disheartened, thinking she may need to push off her own care in order to attend to children's multiple medical needs.  Terri Shell, LCSW Clinical Social Worker Phone:  [phone]

## 2019-10-02 NOTE — Patient Instructions (Signed)
[facility] Discharge Instructions for Patients Receiving Chemotherapy  Today you received the following chemotherapy agents Keytruda  To help prevent nausea and vomiting after your treatment, we encourage you to take your nausea medication as directed.  If you develop nausea and vomiting that is not controlled by your nausea medication, call the clinic.   BELOW ARE SYMPTOMS THAT SHOULD BE REPORTED IMMEDIATELY:  *FEVER GREATER THAN 100.5 F  *CHILLS WITH OR WITHOUT FEVER  NAUSEA AND VOMITING THAT IS NOT CONTROLLED WITH YOUR NAUSEA MEDICATION  *UNUSUAL SHORTNESS OF BREATH  *UNUSUAL BRUISING OR BLEEDING  TENDERNESS IN MOUTH AND THROAT WITH OR WITHOUT PRESENCE OF ULCERS  *URINARY PROBLEMS  *BOWEL PROBLEMS  UNUSUAL RASH Items with * indicate a potential emergency and should be followed up as soon as possible.  Feel free to call the clinic should you have any questions or concerns. The clinic phone number is [phone].  Please show the [place] at check-in to the Emergency Department and triage nurse.  Goserelin injection What is this medicine? GOSERELIN (GOE se rel in) is similar to a hormone found in the body. It lowers the amount of sex hormones that the body makes. Men will have lower testosterone levels and women will have lower estrogen levels while taking this medicine. In men, this medicine is used to treat prostate cancer; the injection is either given once per month or once every 12 weeks. A once per month injection (only) is used to treat women with endometriosis, dysfunctional uterine bleeding, or advanced breast cancer. This medicine may be used for other purposes; ask your health care provider or pharmacist if you have questions. COMMON BRAND NAME(S): Zoladex What should I tell my health care provider before I take this medicine? They need to know if you have any of these conditions:  bone problems  diabetes  heart disease  history of  irregular heartbeat  an unusual or allergic reaction to goserelin, other medicines, foods, dyes, or preservatives  pregnant or trying to get pregnant  breast-feeding How should I use this medicine? This medicine is for injection under the skin. It is given by a health care professional in a hospital or clinic setting. Talk to your pediatrician regarding the use of this medicine in children. Special care may be needed. Overdosage: If you think you have taken too much of this medicine contact a poison control center or emergency room at once. NOTE: This medicine is only for you. Do not share this medicine with others. What if I miss a dose? It is important not to miss your dose. Call your doctor or health care professional if you are unable to keep an appointment. What may interact with this medicine? Do not take this medicine with any of the following medications:  cisapride  dronedarone  pimozide  thioridazine This medicine may also interact with the following medications:  other medicines that prolong the QT interval (an abnormal heart rhythm) This list may not describe all possible interactions. Give your health care provider a list of all the medicines, herbs, non-prescription drugs, or dietary supplements you use. Also tell them if you smoke, drink alcohol, or use illegal drugs. Some items may interact with your medicine. What should I watch for while using this medicine? Visit your doctor or health care provider for regular checks on your progress. Your symptoms may appear to get worse during the first weeks of this therapy. Tell your doctor or healthcare provider if your symptoms do not  start to get better or if they get worse after this time. Your bones may get weaker if you take this medicine for a long time. If you smoke or frequently drink alcohol you may increase your risk of bone loss. A family history of osteoporosis, chronic use of drugs for seizures (convulsions), or  corticosteroids can also increase your risk of bone loss. Talk to your doctor about how to keep your bones strong. This medicine should stop regular monthly menstruation in women. Tell your doctor if you continue to menstruate. Women should not become pregnant while taking this medicine or for 12 weeks after stopping this medicine. Women should inform their doctor if they wish to become pregnant or think they might be pregnant. There is a potential for serious side effects to an unborn child. Talk to your health care professional or pharmacist for more information. Do not breast-feed an infant while taking this medicine. Men should inform their doctors if they wish to father a child. This medicine may lower sperm counts. Talk to your health care professional or pharmacist for more information. This medicine may increase blood sugar. Ask your healthcare provider if changes in diet or medicines are needed if you have diabetes. What side effects may I notice from receiving this medicine? Side effects that you should report to your doctor or health care professional as soon as possible:  allergic reactions like skin rash, itching or hives, swelling of the face, lips, or tongue  bone pain  breathing problems  changes in vision  chest pain  feeling faint or lightheaded, falls  fever, chills  pain, swelling, warmth in the leg  pain, tingling, numbness in the hands or feet  signs and symptoms of high blood sugar such as being more thirsty or hungry or having to urinate more than normal. You may also feel very tired or have blurry vision  signs and symptoms of low blood pressure like dizziness; feeling faint or lightheaded, falls; unusually weak or tired  stomach pain  swelling of the ankles, feet, hands  trouble passing urine or change in the amount of urine  unusually high or low blood pressure  unusually weak or tired Side effects that usually do not require medical attention (report  to your doctor or health care professional if they continue or are bothersome):  change in sex drive or performance  changes in breast size in both males and females  changes in emotions or moods  headache  hot flashes  irritation at site where injected  loss of appetite  skin problems like acne, dry skin  vaginal dryness This list may not describe all possible side effects. Call your doctor for medical advice about side effects. You may report side effects to FDA at 1-800-FDA-1088. Where should I keep my medicine? This drug is given in a hospital or clinic and will not be stored at home. NOTE: This sheet is a summary. It may not cover all possible information. If you have questions about this medicine, talk to your doctor, pharmacist, or health care provider.  2020 [employer] Standard (2019-04-02 14:05:56)

## 2019-10-02 NOTE — Addendum Note (Signed)
Addended by: Chauncey Cruel on: 10/02/2019 02:19 PM   Modules accepted: Orders

## 2019-10-02 NOTE — Progress Notes (Signed)
[name]  [phone] [phone]    ID: Terri Estes DOB: Sep 12, 1986  MR#: [id number]  JYN#:[id number]  Patient Care Team: Patient, No Pcp Per as PCP - General (General Practice) Alphonsa Overall, MD as Consulting Physician (General Surgery) Natarsha Hurwitz, [name], MD as Consulting Physician (Oncology) Kyung Rudd, MD as Consulting Physician (Radiation Oncology) Rockwell Germany, RN as Oncology Nurse Navigator Mauro Kaufmann, RN as Oncology Nurse Navigator Donnamae Jude, MD as Consulting Physician (Obstetrics and Gynecology) Pavelock, Ralene Bathe, MD as Consulting Physician (Internal Medicine) OTHER MD: Joylene Igo for [facility] is [phone]   CHIEF COMPLAINT: Triple negative breast cancer  CURRENT TREATMENT: pembrolizumab, goserelin   INTERVAL HISTORY: Terri Estes returns today for follow-up and treatment of her estrogen negative breast cancer. She was last seen here on 09/12/2019.   She continues on pembrolizumab.  She tolerates this well, with no significant side effects although she says she thinks it makes her little bit thirsty for a few days.  She also continues on goserelin.  She is having significant hot flashes.  She says she feels like she has to walk into her refrigerator  We are following her TSH:   Results for MAKAILEE, NUDELMAN (MRN [id number]) as of 10/02/2019 13:47  Ref. Range 12/16/2016 11:30 05/08/2019 09:12 09/12/2019 12:15  TSH Latest Ref Range: 0.308 - 3.960 uIU/mL 0.012 (L) 1.889 0.235 (L)    REVIEW OF SYSTEMS: Terri Estes saw Dr. Lucia Gaskins today and was told she would need a COVID test.  She is terrified of having something stuck up her nose that may make her bleed or go into her brain even.  Apparently 1 of her children had to be tested and he jumped around and was traumatized.  We discussed this at length today.  Her hair has not started to come back.  Her scalp is very itchy possibly because of the way again possibly because  of psoriasis.  She is having significant pain with intercourse.  A detailed review of systems today was otherwise stable   HISTORY OF CURRENT ILLNESS: From the original intake note:  Terri Estes presented with swelling along the left side of the face with neck pain. She then underwent a neck CT on 04/11/2019 showing: Enlarged left level 2 lymph nodes with associated inflammatory change including stranding of the adjacent fat and thickening of the platysma. While malignancy is not excluded, this is most concerning for infection. No focal abscess is present. Hypoattenuation within 1 of the left level 2 lymph nodes likely represents central necrosis or suppuration of the node. No primary malignancy or other abscess. Left upper lobe pulmonary nodules may be inflammatory. The largest measures 0.7 x 0.7 x 0.6 cm. CT of the chest with contrast may be useful for further evaluation.  She also underwent a chest CT on the same day showing: Complex right breast mass measuring 5.6 x 4.5 x 4.7 cm consistent with a primary breast carcinoma. Malignant right axillary adenopathy measuring up to 3.1 cm. Multiple bilateral pulmonary nodules are concerning for metastatic disease. Given the right breast mass, the left cervical lymph nodes are more likely malignant.  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  Accordingly on 04/19/2019 she proceeded to biopsy of the right breast area  in question. The pathology from this procedure showed ([id number]): invasive ductal carcinoma, grade III, upper inner quadrant, 12:30 o'clock, 5.0 cm from the nipple. Prognostic indicators significant for: estrogen  receptor, 0% negative and progesterone receptor, 0% negative. Proliferation marker Ki67 at 90%. HER2 negative (0+).  Additional biopsies of the right breast and right axilla were performed on the same day. The pathology from this procedure showed ([id number]): 2. Breast, right, needle core biopsy, satellite mass UOQ, 10 o'clock, 8cmfn  - Invasive ductal carcinoma, grade III 3. Lymph node, needle/core biopsy, level 1 right axilla   - Invasive ductal carcinoma, grade III  The patient's subsequent history is as detailed below.   PAST MEDICAL HISTORY: Past Medical History:  Diagnosis Date   Anemia    after pregnancy   Anxiety    severe not taking meds   Asthma    inhaler  4x per day   Chronic pain    Family history of breast cancer    History of substance abuse ([place])    Pregnancy induced hypertension    takes medication now.   Psoriasis    Trichomonas infection    Vaginal Pap smear, abnormal      PAST SURGICAL HISTORY: Past Surgical History:  Procedure Laterality Date   CESAREAN SECTION  05/26/2008   CESAREAN SECTION N/A 07/10/2017   Procedure: CESAREAN SECTION;  Surgeon: Florian Buff, MD;  Location: [place];  Service: Obstetrics;  Laterality: N/A;   CESAREAN SECTION N/A 09/23/2018   Procedure: CESAREAN SECTION;  Surgeon: Donnamae Jude, MD;  Location: [place];  Service: Obstetrics;  Laterality: N/A;   COLPOSCOPY     PORTACATH PLACEMENT Left 04/30/2019   Procedure: INSERTION PORT-A-CATH WITH ULTRASOUND;  Surgeon: Alphonsa Overall, MD;  Location: [facility];  Service: General;  Laterality: Left;  Left Subclavian vein   WISDOM TOOTH EXTRACTION       FAMILY HISTORY: Family History  Problem Relation Age of Onset   Kidney disease Maternal Grandmother    Breast cancer Maternal Grandmother 28       metastatic to liver; d. 32   Kidney disease Maternal Grandfather    Kidney disease Paternal Grandmother    Diabetes Paternal Grandmother     Breast cancer Paternal Grandmother    Kidney disease Paternal Grandfather    Breast cancer Paternal Aunt    Cerebral palsy Child    Breast cancer Paternal Great-grandmother    Pancreatic cancer Other        PGMs brother    Throat cancer Paternal Uncle    Breast cancer Other        PGMs sister  (Updated 04/25/2019) Ellarose's father is living at age 40. Patients' mother is also living at age 12. [place] notes, however, that she was raised by her grandmother.) The patient has 0 brothers and 5 sisters. Patient denies anyone in her family having ovarian, prostate, or pancreatic cancer. Her maternal grandmother was diagnosed with breast cancer, unsure what age. On her father's side, she reports her grandmother had cysts in her breasts, her great-aunt might have had breast cancer, and her great-grandmother was diagnosed with breast cancer at an older age.   GYNECOLOGIC HISTORY:  No LMP recorded. (Menstrual status: Chemotherapy).  She had an emergent C-section in 09/23/2018 for abruptio placenta at 30 weeks pregnancy, also has a history of eclampsia Menarche:    years old Age at first live birth: 33 years old [place] P: 3 LMP: 04/18/2019 Contraceptive: none HRT: none  Hysterectomy?:  no BSO?: no   SOCIAL HISTORY: (Current as of August 2020) Terri Estes is currently unemployed. She previously worked at [employer]. She is currently engaged. Fiance [name] runs a Mid[employer] and works other odd jobs.  The patient recently moved to [place].  Daughter Demetrius Charity, age 1, was abused by her father at 22 months and has cerebral palsy. Son [name], age 44, has severe asthma and is developmentally disabled. Daughter Yetta Glassman was born on 09/23/2018 prematurely and remains on oxygen at home.  The patient's aunt has been staying with her and is helping with the children and providing general support   ADVANCED DIRECTIVES: not in place.    HEALTH MAINTENANCE: Social History   Tobacco Use    Smoking status: Former Smoker    Packs/day: 0.50    Years: 5.00    Pack years: 2.50    Types: Cigarettes    Quit date: 04/10/2019    Years since quitting: 0.4   Smokeless tobacco: Never Used   Tobacco comment: trying to quit  Substance Use Topics   Alcohol use: No   Drug use: Yes    Frequency: 7.0 times per week    Types: Marijuana    Comment: subutex    Colonoscopy: never done  PAP: 05/10/2018  Bone density: never done Mammography: first performed following abnormal CT  No Known Allergies   OBJECTIVE: Young African-American woman in no acute distress  Vitals:   10/02/19 1329  BP: 98/76  Pulse: 97  Resp: 18  Temp: 97.8 F (36.6 C)  SpO2: 100%   Wt Readings from Last 3 Encounters:  10/02/19 195 lb 12.8 oz (88.8 kg)  08/29/19 202 lb (91.6 kg)  08/22/19 195 lb 3.2 oz (88.5 kg)   Body mass index is 38.24 kg/m.    ECOG FS:1 - Symptomatic but completely ambulatory  Ocular: Sclerae unicteric, pupils round and equal Ear-nose-throat: Wearing a mask Lymphatic: No cervical or supraclavicular adenopathy Lungs no rales or rhonchi Heart regular rate and rhythm Abd soft, nontender, positive bowel sounds MSK no focal spinal tenderness, no joint edema Neuro: non-focal, well-oriented, appropriate affect Breasts: The right right breast is significantly softer than it was originally.  I do not palpate any axillary masses.  Left breast is benign.   Right breast 08/15/2019       LAB RESULTS:  CMP     Component Value Date/Time   NA 141 10/02/2019 1248   NA 137 05/10/2018 1535   K 3.2 (L) 10/02/2019 1248   CL 105 10/02/2019 1248   CO2 26 10/02/2019 1248   GLUCOSE 96 10/02/2019 1248   BUN 7 10/02/2019 1248   BUN 7 05/10/2018 1535   CREATININE 0.77 10/02/2019 1248   CREATININE 0.81 07/03/2019 0919   CALCIUM 9.2 10/02/2019 1248   PROT 6.6 10/02/2019 1248   PROT 7.2 05/10/2018 1535   ALBUMIN 3.9 10/02/2019 1248   ALBUMIN 4.2 05/10/2018 1535   AST 19 10/02/2019  1248   AST 54 (H) 07/03/2019 0919   ALT 14 10/02/2019 1248   ALT 102 (H) 07/03/2019 0919   ALKPHOS 75 10/02/2019 1248   BILITOT 0.3 10/02/2019 1248   BILITOT <0.2 (L) 07/03/2019 0919   GFRNONAA >60 10/02/2019 1248   GFRNONAA >60 07/03/2019 0919   GFRAA >60 10/02/2019 1248   GFRAA >60 07/03/2019 0919    No results found for: TOTALPROTELP, ALBUMINELP, A1GS, A2GS, BETS, BETA2SER, GAMS, MSPIKE, SPEI  No results found for: KPAFRELGTCHN, LAMBDASER, KAPLAMBRATIO  Lab Results  Component Value  Date   WBC 4.4 10/02/2019   NEUTROABS 2.8 10/02/2019   HGB 10.3 (L) 10/02/2019   HCT 31.5 (L) 10/02/2019   MCV 101.0 (H) 10/02/2019   PLT 243 10/02/2019    _0 @  No results found for: LABCA2  No components found for: [id number]  No results for input(s): INR in the last 168 hours.  No results found for: LABCA2  No results found for: YQM578  No results found for: ION629  No results found for: BMW413  No results found for: CA2729  No components found for: HGQUANT  No results found for: CEA1 / No results found for: CEA1   No results found for: AFPTUMOR  No results found for: CHROMOGRNA  No results found for: PSA1  Appointment on 10/02/2019  Component Date Value Ref Range Status   Sodium 10/02/2019 141  135 - 145 mmol/L Final   Potassium 10/02/2019 3.2* 3.5 - 5.1 mmol/L Final   Chloride 10/02/2019 105  98 - 111 mmol/L Final   CO2 10/02/2019 26  22 - 32 mmol/L Final   Glucose, Bld 10/02/2019 96  70 - 99 mg/dL Final   BUN 10/02/2019 7  6 - 20 mg/dL Final   Creatinine, Ser 10/02/2019 0.77  0.44 - 1.00 mg/dL Final   Calcium 10/02/2019 9.2  8.9 - 10.3 mg/dL Final   Total Protein 10/02/2019 6.6  6.5 - 8.1 g/dL Final   Albumin 10/02/2019 3.9  3.5 - 5.0 g/dL Final   AST 10/02/2019 19  15 - 41 U/L Final   ALT 10/02/2019 14  0 - 44 U/L Final   Alkaline Phosphatase 10/02/2019 75  38 - 126 U/L Final   Total Bilirubin 10/02/2019 0.3  0.3 - 1.2 mg/dL Final    GFR calc non Af Amer 10/02/2019 >60  >60 mL/min Final   GFR calc Af Amer 10/02/2019 >60  >60 mL/min Final   Anion gap 10/02/2019 10  5 - 15 Final   Performed at [facility], [place] [address], [place], [place] [address]   WBC 10/02/2019 4.4  4.0 - 10.5 K/uL Final   RBC 10/02/2019 3.12* 3.87 - 5.11 MIL/uL Final   Hemoglobin 10/02/2019 10.3* 12.0 - 15.0 g/dL Final   HCT 10/02/2019 31.5* 36.0 - 46.0 % Final   MCV 10/02/2019 101.0* 80.0 - 100.0 fL Final   MCH 10/02/2019 33.0  26.0 - 34.0 pg Final   MCHC 10/02/2019 32.7  30.0 - 36.0 g/dL Final   RDW 10/02/2019 16.4* 11.5 - 15.5 % Final   Platelets 10/02/2019 243  150 - 400 K/uL Final   nRBC 10/02/2019 0.0  0.0 - 0.2 % Final   Neutrophils Relative % 10/02/2019 63  % Final   Neutro Abs 10/02/2019 2.8  1.7 - 7.7 K/uL Final   Lymphocytes Relative 10/02/2019 23  % Final   Lymphs Abs 10/02/2019 1.0  0.7 - 4.0 K/uL Final   Monocytes Relative 10/02/2019 12  % Final   Monocytes Absolute 10/02/2019 0.5  0.1 - 1.0 K/uL Final   Eosinophils Relative 10/02/2019 1  % Final   Eosinophils Absolute 10/02/2019 0.0  0.0 - 0.5 K/uL Final   Basophils Relative 10/02/2019 1  % Final   Basophils Absolute 10/02/2019 0.0  0.0 - 0.1 K/uL Final   Immature Granulocytes 10/02/2019 0  % Final   Abs Immature Granulocytes 10/02/2019 0.01  0.00 - 0.07 K/uL Final   Performed at [facility], [place] [address], [place],  [address]    (  this displays the last labs from the last 3 days)  No results found for: TOTALPROTELP, ALBUMINELP, A1GS, A2GS, BETS, BETA2SER, GAMS, MSPIKE, SPEI (this displays SPEP labs)  No results found for: KPAFRELGTCHN, LAMBDASER, KAPLAMBRATIO (kappa/lambda light chains)  Lab Results  Component Value Date   HGBA 97.4 12/16/2016   (Hemoglobinopathy evaluation)   No results found for: LDH  No results found for: IRON, TIBC, IRONPCTSAT (Iron and TIBC)  Lab Results    Component Value Date   FERRITIN 900 (H) 09/12/2019    Urinalysis    Component Value Date/Time   COLORURINE YELLOW 06/26/2019 1300   APPEARANCEUR HAZY (A) 06/26/2019 1300   LABSPEC 1.019 06/26/2019 1300   PHURINE 6.0 06/26/2019 1300   GLUCOSEU NEGATIVE 06/26/2019 1300   HGBUR NEGATIVE 06/26/2019 1300   BILIRUBINUR NEGATIVE 06/26/2019 1300   KETONESUR NEGATIVE 06/26/2019 1300   PROTEINUR NEGATIVE 06/26/2019 1300   UROBILINOGEN 4.0 (H) 01/13/2018 1417   NITRITE NEGATIVE 06/26/2019 1300   LEUKOCYTESUR LARGE (A) 06/26/2019 1300     STUDIES:  Mr Breast Bilateral W Wo Contrast Inc Cad  Addendum Date: 09/12/2019   ADDENDUM REPORT: 09/12/2019 12:39 ADDENDUM: The previously seen suspicious right pectoralis muscle enhancement has resolved. There is persistent superficial T2 signal along the anterior aspect of the muscle which may represent post treatment change. The size of the right pectoralis muscle is now symmetric compared to the left. There are no remaining suspicious internal mammary chain or juxta cardiac lymph nodes. Electronically Signed   By: Audie Pinto M.D.   On: 09/12/2019 12:39   Result Date: 09/12/2019 CLINICAL DATA:  History of multi centric inflammatory right breast cancer with metastatic right axillary lymph nodes. Patient is status post neoadjuvant chemotherapy. LABS:  None obtained EXAM: BILATERAL BREAST MRI WITH AND WITHOUT CONTRAST TECHNIQUE: Multiplanar, multisequence MR images of both breasts were obtained prior to and following the intravenous administration of 9 ml of Gadavist Three-dimensional MR images were rendered by post-processing of the original MR data on an independent workstation. The three-dimensional MR images were interpreted, and findings are reported in the following complete MRI report for this study. Three dimensional images were evaluated at the independent DynaCad workstation COMPARISON:  Previous exam(s). FINDINGS: Breast composition: b. Scattered  fibroglandular tissue. Background parenchymal enhancement: Moderate. Right breast: Interval decrease in size of the dominant centrally necrotic rim enhancing mass in the right breast measuring 2.7 x 2.2 x 1.9 cm (series 6, image 75) abutting this is a smaller similar appearing rim enhancing mass measuring 1.0 x 1.0 cm x 1.0 cm (series 6, image 78). There are several additional remaining tiny enhancing nodules in the vicinity of the dominant mass measuring up to 4 mm (for example series 6, image 75) within the upper central and upper outer breast. Overall significant decrease in the right breast skin enhancement. The 2 previously described rim enhancing masses within the skin in the anterior breast have decreased, now a small residual focus of enhancement measuring 5 mm (series 6, image 60). There are a few additional small scattered foci of skin enhancement along the medial and anterior aspect of the breast (for example series 6, image 77). Overall significant debulking of the previously seen non mass enhancement extending throughout all 4 quadrants of the breast. However there remains a large area of asymmetric non mass enhancement measuring at least 10.1 x 8.1 cm which could represent a combination of residual disease and or post treatment affect. Left breast: No new mass or abnormal enhancement. Lymph nodes:  Interval significant decrease in of multiple previously seen enlarged right axillary lymph nodes. There is 1 remaining abnormal appearing lymph node, previously the largest lymph node seen, now measuring 1.0 x 1.2 cm (series 3, image 22), previously measuring 3.1 x 2.6 cm. Ancillary findings:  Left chest Port-A-Cath in place. IMPRESSION: 1. 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 Interval significant improvement in skin enhancement, as well as disease involving all four quadrants of the breast. There remains a centrally necrotic mass measuring up to 2.7 cm with  additional tiny likely satellite nodules in the vicinity. There are residual small scattered areas of skin enhancement. Overall significant debulking of non-mass enhancement seen throughout the breast however a large area measuring up to 10 cm continues to enhance asymmetrically compared to the left side, possibly representing a combination of residual disease and/or treatment effect. 2. Interval significant decrease in size of multiple enlarged right axillary lymph nodes. There is a remaining abnormal mildly enlarged right axillary lymph node measuring 1.0 cm. 3. No new or suspicious findings in the left breast. RECOMMENDATION: Continued treatment planning and surgical management for the known right breast carcinoma. BI-RADS CATEGORY  6: Known biopsy-proven malignancy. Electronically Signed: By: Audie Pinto M.D. On: 09/12/2019 11:14     ELIGIBLE FOR AVAILABLE RESEARCH PROTOCOL: no   ASSESSMENT: 33 y.o. [place] woman status post right breast biopsy x2 and right axillary lymph node biopsy 04/19/2019 for a clinical T4 N2 MX, stage IIIC invasive ductal carcinoma, grade 3, triple negative, with an MIB-1 of 90%  (a) chest CT scan 04/11/2019 shows multiple subcentimeter lung nodules  (b) PET scan denied by insurance  (c) CT A/P and bone scan due 05/09/2019 show a very small low-density lesion in the lateral segment of the left liver which is indeterminate, and sclerosis of the right SI joint, which is felt to be likely degenerative; bone scan was entirely negative.  (c) baseline breast MRI 04/30/2019 shows involvement of all 4 quadrants in the right breast as well as right axillary subpectoral and internal mammary lymphadenopathy.  Left breast was unremarkable  (1) neoadjuvant chemotherapy consisting of  (a) pembrolizumab started 05/03/2019, third dose delayed because of elevated liver function tests, resumed 08/02/2019  (b) paclitaxel weekly x12 with carboplatin every 21 days x 4, started  05/08/2019   (i) tolerated carboplatin AUC 5-day 1 poorly, switched to AUC 2 on day 1 day 8   (ii) cycle 2 delayed 1 week because of elevated liver function tests   (iii) carboplatin and paclitaxel discontinued after 07/03/2019 because of neuropathy, (received 7 weekly doses paclitaxel and one AUC5 carbo dose, four AUC2 doses)  (c) cyclophosphamide and doxorubicin x4 started 07/03/2019, completed 08/29/2019   (i) 2d dose given day 28 due to poor tolerance; other doses Q 21 days   (2) definitive surgery pending  (3) adjuvant radiation to follow, with capecitabine sensitization  (4) genetics testing  (a) genetic testing 05/14/2019 through the Common Hereditary Gene Panel offered by [facility] found no deleterious mutations in APC, ATM, AXIN2, BARD1, BMPR1A, BRCA1, BRCA2, BRIP1, CDH1, CDK4, CDKN2A (p14ARF), CDKN2A (p16INK4a), CHEK2, CTNNA1, DICER1, EPCAM (Deletion/duplication testing only), GREM1 (promoter region deletion/duplication testing only), KIT, MEN1, MLH1, MSH2, MSH3, MSH6, MUTYH, NBN, NF1, NHTL1, PALB2, PDGFRA, PMS2, POLD1, POLE, PTEN, RAD50, RAD51C, RAD51D, RNF43, SDHB, SDHC, SDHD, SMAD4, SMARCA4. STK11, TP53, TSC1, TSC2, and VHL.  The following genes were evaluated for sequence changes only: SDHA and HOXB13 c.251G>A variant only  (5) goserelin started 05/08/2019  (6)  pembrolizumab to be continued and minimum of 1 year (through May 2021)  (a) changed to every 4 weeks after October dose to coincide with goserelin doses   PLAN: Terri Estes continues to recover from her chemotherapy and is now ready to proceed to surgery.  The target date is 10/16/2019.  She does need a COVID test.  We discussed that extensively today.  She understands have had a done twice and it is uncomfortable for 2 or 3 minutes but afterwards it is just fine.  She is agreeable to getting tested 10/12/2019.  We will set that up since she had some disagreement with a nurse at Dr. Pollie Friar apparently.  We are moving her  pembrolizumab to every 4 weeks after this dose so that she can receive the goserelin and the pembrolizumab both at the same time.  She will see me again November 3.  We will review the pathology at that time  She knows to call for any other issue that may develop before the next visit.   Allegra Cerniglia, [name], MD  10/02/19 2:07 PM Medical Oncology and Hematology [facility] [place],  [address] Tel. [phone]    Fax. [phone]  I, Jacqualyn Posey am acting as a Education administrator for Chauncey Cruel, MD.   I, Lurline Del MD, have reviewed the above documentation for accuracy and completeness, and I agree with the above.

## 2019-10-03 ENCOUNTER — Other Ambulatory Visit: Payer: Medicaid Other

## 2019-10-03 ENCOUNTER — Ambulatory Visit: Payer: Medicaid Other

## 2019-10-04 ENCOUNTER — Encounter: Payer: Self-pay | Admitting: *Deleted

## 2019-10-09 ENCOUNTER — Ambulatory Visit: Payer: Medicaid Other | Admitting: Physical Therapy

## 2019-10-11 ENCOUNTER — Other Ambulatory Visit (HOSPITAL_COMMUNITY): Payer: Self-pay | Admitting: Surgery

## 2019-10-11 NOTE — Progress Notes (Signed)
[facility] DRUG STORE [place], [place] [address] [place] [address] Phone: [phone] Fax: [phone]  [place], [place] [address] Phone: [phone] Fax: [phone]      Your procedure is scheduled on Tuesday, Oct. 20th.  [facility] to [facility] "A" at 12:30 P.M., and check in at the Admitting office.  Call this number if you have problems the morning of surgery:  [phone]  Call [phone] if you have any questions prior to your surgery date Monday-Friday 8am-4pm    Remember:  Do not eat or drink after midnight the night before your surgery    Take these medicines the morning of surgery with A SIP OF WATER   Albuterol inhaler - if needed  Gabapentin (Neurontin)  Omeprazole (Prilosec)  Zofran - if needed  Paroxetine (Paxil)  7 days prior to surgery STOP taking any Aspirin (unless otherwise instructed by your surgeon), Aleve, Naproxen, Ibuprofen, Motrin, Advil, Goody's, BC's, all herbal medications, fish oil, and all vitamins.    The Morning of Surgery  Do not wear jewelry, make-up or nail polish.  Do not wear lotions, powders, or perfumes, or deodorant  Do not shave 48 hours prior to surgery.    Do not bring valuables to the hospital.  [facility] is not responsible for any belongings or valuables.  If you are a smoker, DO NOT Smoke 24 hours prior to surgery IF you wear a CPAP at night please bring your mask, tubing, and machine the morning of surgery   Remember that you must have someone to transport you home after your surgery, and remain with you for 24 hours if you are discharged the same day.   Contacts, glasses, hearing aids, dentures or bridgework may not be worn into surgery.    Leave your suitcase in the car.  After surgery it may be brought to your room.  For  patients admitted to the hospital, discharge time will be determined by your treatment team.  Patients discharged the day of surgery will not be allowed to drive home.    Special instructions:   [facility]- Preparing For Surgery  Before surgery, you can play an important role. Because skin is not sterile, your skin needs to be as free of germs as possible. You can reduce the number of germs on your skin by washing with CHG (chlorahexidine gluconate) Soap before surgery.  CHG is an antiseptic cleaner which kills germs and bonds with the skin to continue killing germs even after washing.    Oral Hygiene is also important to reduce your risk of infection.  Remember - BRUSH YOUR TEETH THE MORNING OF SURGERY WITH YOUR REGULAR TOOTHPASTE  Please do not use if you have an allergy to CHG or antibacterial soaps. If your skin becomes reddened/irritated stop using the CHG.  Do not shave (including legs and underarms) for at least 48 hours prior to first CHG shower. It is OK to shave your face.  Please follow these instructions carefully.   1. Shower the NIGHT BEFORE SURGERY and the MORNING OF SURGERY with CHG Soap.   2. If you chose to wash your hair, wash your hair first as usual with your normal shampoo.  3. After you shampoo, rinse your hair and body thoroughly to remove the shampoo.  4. Use CHG as  you would any other liquid soap. You can apply CHG directly to the skin and wash gently with a scrungie or a clean washcloth.   5. Apply the CHG Soap to your body ONLY FROM THE NECK DOWN.  Do not use on open wounds or open sores. Avoid contact with your eyes, ears, mouth and genitals (private parts). Wash Face and genitals (private parts)  with your normal soap.   6. Wash thoroughly, paying special attention to the area where your surgery will be performed.  7. Thoroughly rinse your body with warm water from the neck down.  8. DO NOT shower/wash with your normal soap after using and rinsing off the  CHG Soap.  9. Pat yourself dry with a CLEAN TOWEL.  10. Wear CLEAN PAJAMAS to bed the night before surgery, wear comfortable clothes the morning of surgery  11. Place CLEAN SHEETS on your bed the night of your first shower and DO NOT SLEEP WITH PETS.    Day of Surgery:  Do not apply any deodorants/lotions. Please shower the morning of surgery with the CHG soap  Please wear clean clothes to the hospital/surgery center.   Remember to brush your teeth WITH YOUR REGULAR TOOTHPASTE.   Please read over the following fact sheets that you were given.

## 2019-10-12 ENCOUNTER — Other Ambulatory Visit (HOSPITAL_COMMUNITY)
Admission: RE | Admit: 2019-10-12 | Discharge: 2019-10-12 | Disposition: A | Discharge status: Home / Self Care | Payer: Medicaid Other | Source: Ambulatory Visit | Attending: Surgery | Admitting: Surgery

## 2019-10-12 ENCOUNTER — Encounter (HOSPITAL_COMMUNITY): Payer: Self-pay

## 2019-10-12 ENCOUNTER — Other Ambulatory Visit: Payer: Self-pay

## 2019-10-12 ENCOUNTER — Encounter (HOSPITAL_COMMUNITY)
Admission: RE | Admit: 2019-10-12 | Discharge: 2019-10-12 | Disposition: A | Discharge status: Home / Self Care | Payer: Medicaid Other | Source: Ambulatory Visit | Attending: Surgery | Admitting: Surgery

## 2019-10-12 DIAGNOSIS — I10 Essential (primary) hypertension: Secondary | ICD-10-CM | POA: Diagnosis not present

## 2019-10-12 DIAGNOSIS — Z79899 Other long term (current) drug therapy: Secondary | ICD-10-CM | POA: Diagnosis not present

## 2019-10-12 DIAGNOSIS — F1521 Other stimulant dependence, in remission: Secondary | ICD-10-CM | POA: Diagnosis not present

## 2019-10-12 DIAGNOSIS — F329 Major depressive disorder, single episode, unspecified: Secondary | ICD-10-CM | POA: Insufficient documentation

## 2019-10-12 DIAGNOSIS — C50911 Malignant neoplasm of unspecified site of right female breast: Secondary | ICD-10-CM | POA: Diagnosis not present

## 2019-10-12 DIAGNOSIS — Z87891 Personal history of nicotine dependence: Secondary | ICD-10-CM | POA: Diagnosis not present

## 2019-10-12 DIAGNOSIS — F419 Anxiety disorder, unspecified: Secondary | ICD-10-CM | POA: Diagnosis not present

## 2019-10-12 DIAGNOSIS — Z20828 Contact with and (suspected) exposure to other viral communicable diseases: Secondary | ICD-10-CM | POA: Insufficient documentation

## 2019-10-12 DIAGNOSIS — J45909 Unspecified asthma, uncomplicated: Secondary | ICD-10-CM | POA: Diagnosis not present

## 2019-10-12 DIAGNOSIS — E669 Obesity, unspecified: Secondary | ICD-10-CM | POA: Insufficient documentation

## 2019-10-12 DIAGNOSIS — Z01812 Encounter for preprocedural laboratory examination: Secondary | ICD-10-CM | POA: Diagnosis not present

## 2019-10-12 DIAGNOSIS — K219 Gastro-esophageal reflux disease without esophagitis: Secondary | ICD-10-CM | POA: Diagnosis not present

## 2019-10-12 DIAGNOSIS — G8929 Other chronic pain: Secondary | ICD-10-CM | POA: Diagnosis not present

## 2019-10-12 DIAGNOSIS — Z6834 Body mass index (BMI) 34.0-34.9, adult: Secondary | ICD-10-CM | POA: Diagnosis not present

## 2019-10-12 HISTORY — DX: Depression, unspecified: F32.A

## 2019-10-12 HISTORY — DX: Gastro-esophageal reflux disease without esophagitis: K21.9

## 2019-10-12 NOTE — Progress Notes (Addendum)
[facility] DRUG STORE [place], [place] [address] [place] [address] Phone: [phone] Fax: [phone]  [place], [place] - [place] [address] Phone: [phone] Fax: [phone]      Your procedure is scheduled on Tuesday, Oct. 20th.  [facility] to [facility]" at 12:30 P.M., and check in at the Admitting office.  Call this number if you have problems the morning of surgery:  [phone]  Call [phone] if you have any questions prior to your surgery date Monday-Friday 8am-4pm   Remember:  Do not eat after midnight the night before your surgery  You may drink clear liquids until 11:30 the morning of your surgery.   Clear liquids allowed are: Water, Non-Citrus Juices (without pulp), Carbonated Beverages, Clear Tea, Black Coffee Only, and Gatorade   Take these medicines the morning of surgery with A SIP OF WATER   Albuterol inhaler - if needed  Buprenorphin (Subutex)  Cyclopenzaprine (Flexeril) - if needed  Gabapentin (Neurontin)  Omeprazole (Prilosec)  Zofran - if needed  Paroxetine (Paxil)  Valacyclovir (Valtrex)  7 days prior to surgery STOP taking any Aspirin (unless otherwise instructed by your surgeon), Aleve, Naproxen, Ibuprofen, Motrin, Advil, Goody's, BC's, all herbal medications, fish oil, and all vitamins.     The Morning of Surgery  Do not wear jewelry, make-up or nail polish.  Do not wear lotions, powders, or perfumes, or deodorant  Do not shave 48 hours prior to surgery.    Do not bring valuables to the hospital.  [facility] is not responsible for any belongings or valuables.  If you are a smoker, DO NOT Smoke 24 hours prior to surgery  If you wear a CPAP at night please bring your mask, tubing, and machine the morning of surgery   Remember that you must have someone to  transport you home after your surgery, and remain with you for 24 hours if you are discharged the same day.   Contacts, glasses, hearing aids, dentures or bridgework may not be worn into surgery.    Leave your suitcase in the car.  After surgery it may be brought to your room.  For patients admitted to the hospital, discharge time will be determined by your treatment team.  Patients discharged the day of surgery will not be allowed to drive home.    Special instructions:   - Preparing For Surgery  Before surgery, you can play an important role. Because skin is not sterile, your skin needs to be as free of germs as possible. You can reduce the number of germs on your skin by washing with CHG (chlorahexidine gluconate) Soap before surgery.  CHG is an antiseptic cleaner which kills germs and bonds with the skin to continue killing germs even after washing.    Oral Hygiene is also important to reduce your risk of infection.  Remember - BRUSH YOUR TEETH THE MORNING OF SURGERY WITH YOUR REGULAR TOOTHPASTE  Please do not use if you have an allergy to CHG or antibacterial soaps. If your skin becomes reddened/irritated stop using the CHG.  Do not shave (including legs and underarms) for at least 48 hours prior to first CHG shower. It is OK to shave your face.  Please follow these instructions carefully.   1. Shower the NIGHT BEFORE SURGERY and the MORNING OF SURGERY  with CHG Soap.   2. If you chose to wash your hair, wash your hair first as usual with your normal shampoo.  3. After you shampoo, rinse your hair and body thoroughly to remove the shampoo.  4. Use CHG as you would any other liquid soap. You can apply CHG directly to the skin and wash gently with a scrungie or a clean washcloth.   5. Apply the CHG Soap to your body ONLY FROM THE NECK DOWN.  Do not use on open wounds or open sores. Avoid contact with your eyes, ears, mouth and genitals (private parts). Wash Face and  genitals (private parts)  with your normal soap.   6. Wash thoroughly, paying special attention to the area where your surgery will be performed.  7. Thoroughly rinse your body with warm water from the neck down.  8. DO NOT shower/wash with your normal soap after using and rinsing off the CHG Soap.  9. Pat yourself dry with a CLEAN TOWEL.  10. Wear CLEAN PAJAMAS to bed the night before surgery, wear comfortable clothes the morning of surgery  11. Place CLEAN SHEETS on your bed the night of your first shower and DO NOT SLEEP WITH PETS.    Day of Surgery:  Please shower the morning of surgery with the CHG soap  Do not apply any deodorants/lotions. Please wear clean clothes to the hospital/surgery center.   Remember to brush your teeth WITH YOUR REGULAR TOOTHPASTE.   Please read over the following fact sheets that you were given.

## 2019-10-12 NOTE — Progress Notes (Signed)
PCP - patient denies Cardiologist - patient denies  PPM/ICD - n/a Device Orders -  Rep Notified -   Chest x-ray - 04/30/19 EKG - 04/26/19 Stress Test - patient denies ECHO - 05/01/19 Cardiac Cath - patient denies  Sleep Study - patient denies CPAP - n/a  Fasting Blood Sugar - n/a Checks Blood Sugar _____ times a day  Blood Thinner Instructions: n/a Aspirin Instructions: n/a  ERAS Protcol - clear liquids until 1130am DOS PRE-SURGERY Ensure or G2- none ordered  COVID TEST- 10/12/2019   Anesthesia review: yes, echo and abnormal EKG  Patient denies shortness of breath, fever, cough and chest pain at PAT appointment.  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 Lucia Gaskins, especially on the operative breast.  Dr. Pollie Friar office notified and informed.  Per Abigail Butts at Dr. Pollie Friar office, Dr. Lucia Gaskins stated that patient has to have surgery and this will not interfere with surgery.  He suggested taking OTC benadryl to help alleviate the rash.  Patient was informed.   All instructions explained to the patient, with a verbal understanding of the material. Patient agrees to go over the instructions while at home for a better understanding. Patient also instructed to self quarantine after being tested for COVID-19. The opportunity to ask questions was provided.

## 2019-10-13 LAB — NOVEL CORONAVIRUS, NAA (HOSP ORDER, SEND-OUT TO REF LAB; TAT 18-24 HRS): SARS-CoV-2, NAA: NOT DETECTED

## 2019-10-15 NOTE — H&P (Signed)
Terri Estes  Location: [facility] Patient #: [id number] DOB: 1986/05/02 Single / Language: Cleophus Molt / Race: Black or African American Female  History of Present Illness   The patient is a 33 year old female who presents with a complaint of breast cancer.  The PCP is none  The patient was seen at the Breast [facility] - Oncology is Drs. Magrinat and Lisbeth Renshaw She is by herself. She had missed her last appt. There has been trouble in getting her to keep a routine follow up according to [facility].  [The Covid-19 virus has disrupted normal medical care in [place] and across the nation. We have sometimes had to alter normal surgical/medical care to limit this epidemic and we have explained these changes to the patient.]  MRI on 09/12/2019 1. 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 Interval significant improvement in skin enhancement, as well as disease involving all four quadrants of the breast. There remains a centrally necrotic mass measuring up to 2.7 cm with additional tiny likely satellite nodules in the vicinity. There are residual small scattered areas of skin enhancement. Overall significant debulking of non-mass enhancement seen throughout the breast however a large area measuring up to 10 cm continues to enhance asymmetrically compared to the left side, possibly representing a combination of residual disease and/or treatment effect. 2. Interval significant decrease in size of multiple enlarged right axillary lymph nodes. There is a remaining abnormal mildly enlarged right axillary lymph node measuring 1.0 cm. 3. No new or suspicious findings in the left breast.  She has major social issues. She is currently living with her father in [place]. She has a sister who is a recovering addict who is also living in the house which causes constant friction. Her fianc, [name], is staying with his brother, so he can  not help. They are looking for a place to stay. She has 3 children who all have significant medical needs. I reviewed the right mastectomy and lymph node dissection with her. With all the uncertainty of her disease and her social circumstances, I don't think she is a candidate for reconstruction at this time. I gave her a book on breast cancer and mastectomies so she'll have some insight into her surgery. Plan: 1. Schedule right MRM  Right breast cancer: She went to the [facility] ER on 04/11/2019 for swelling in her left neck. She was worried about left tooth problems (she think that she broke a tooth), her left ear hurt. She had also been complaining to the lactation nurse about changes in her right breast. She had trouble pumping her right breast, she noticed blood in the milk, and had right nipple swelling.  In the ER on 04/11/2019, she had a neck and chest CT scan. The chest CT scan showed 1. Complex right breast mass measuring 5.6 x 4.5 x 4.7 cm consistent with a primary breast carcinoma. 2. Malignant right axillary adenopathy measuring up to 3.1 cm. 3. Multiple bilateral pulmonary nodules are concerning for metastatic disease. 4. Given the right breast mass, the left cervical lymph nodes are more likely malignant. The neck CT scan shows: 1. Enlarged left level 2 lymph nodes with associated inflammatory change including stranding of the adjacent fat and thickening of the platysma. While malignancy is not excluded, this is most concerning for infection. No focal abscess is present. 2. Hypoattenuation within 1 of the left level 2 lymph nodes likely represents central necrosis or suppuration of the node. 3. No primary malignancy  or other abscess.  4. Left upper lobe pulmonary nodules may be inflammatory. The largest measures 7 x 7 x 6 mm. CT of the chest with contrast may be useful for further  evaluation.  I discussed the options for breast cancer treatment with the patient. The patient was originally seen at the [facility], which includes medical oncology and radiation oncology. I discussed the surgical options of lumpectomy vs. mastectomy. She will need a right mastectomy. I discussed the need for a right axillary node dissection. The risks of surgery include, but are not limited to, bleeding, infection, the need for further surgery, and nerve injury. The patient has been given literature on the treatment of breast cancer.  Past Medical History: 1. Right breast cancer Right breast biopsy x 2 and right axillary node - 04/19/2019 ([id number]) - IDC, grade 3, triple negative, Ki67 - 90%, right axillary node POSITIVE for metastatic cancer She received neoadjuvant chemotx with some significant improvement in the way her right breast looks 2. History of asthma 3. 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 It sounds like her abuse was mainly alcohol and pain meds. Sees Dr. Lillia Corporal She was hospitalized [facility]) for 65 days in 2016 to detox. 4. Trying to quit smoking 5. HTN History of preeclampsia 6. Asthmas On an inhaler 7. Muscle spasm On Flexeril  Social History: Celesta Gentile - [name] (but sounds like this relationship is tenuous) has 3 children: [name] (with CP) born 2010, [name] (born 2018) - has severe asthma, Zeanna (born on 09/23/2018 and was in the hospital until 12/28/2018) Her 3rd child, Yetta Glassman, was born prematurely because of placental abruption.   She used to live in [place], but moved away to get a fresh start on life. She has a first cousin, Marliss Coots, who will help her here. Her parents are divorced and live in [place]. Before her delivery, she was working at [employer] - but this did not sound like it was going well.   Allergies (Tanisha A. Owens Shark, [place]; 09/27/2019 2:54 PM) No Known Allergies  [04/23/2019]: Allergies Reconciled   Medication History (Tanisha A. Owens Shark, [place]; 09/27/2019 2:54 PM) Albuterol (90MCG/ACT Aerosol Soln, Inhalation) Active. Buprenorphine HCl (8MG Tab Sublingual, Sublingual) Active. Cyclobenzaprine HCl (10MG Tablet, Oral) Active. Vasotec (10MG Tablet, Oral) Active. Ferrous Sulfate (325 (65 Fe)MG Tablet, Oral) Active. Prenatal (Oral) Active. Medications Reconciled    Review of Systems Shanon Brow H. Lucia Gaskins, MD; 09/27/2019 3:12 PM) General Not Present- Appetite Loss, Chills, Fatigue, Fever, Night Sweats, Weight Gain and Weight Loss. Skin Not Present- Change in Wart/Mole, Dryness, Hives, Jaundice, New Lesions, Non-Healing Wounds, Rash and Ulcer. HEENT Present- Earache and Sore Throat. Not Present- Hearing Loss, Hoarseness, Nose Bleed, Oral Ulcers, Ringing in the Ears, Seasonal Allergies, Sinus Pain, Visual Disturbances, Wears glasses/contact lenses and Yellow Eyes. Breast Present- Nipple Discharge. Not Present- Breast Mass, Breast Pain and Skin Changes. Cardiovascular Not Present- Chest Pain, Difficulty Breathing Lying Down, Leg Cramps, Palpitations, Rapid Heart Rate, Shortness of Breath and Swelling of Extremities. Gastrointestinal Present- Constipation. Not Present- Abdominal Pain, Bloating, Bloody Stool, Change in Bowel Habits, Chronic diarrhea, Difficulty Swallowing, Excessive gas, Gets full quickly at meals, Hemorrhoids, Indigestion, Nausea, Rectal Pain and Vomiting. Female Genitourinary Not Present- Frequency, Nocturia, Painful Urination, Pelvic Pain and Urgency. Musculoskeletal Present- Back Pain. Not Present- Joint Pain, Joint Stiffness, Muscle Pain, Muscle Weakness and Swelling of Extremities. Neurological Present- Trouble walking. Not Present- Decreased Memory, Fainting, Headaches, Numbness,  Seizures, Tingling, Tremor  and Weakness. Psychiatric Present- Anxiety, Bipolar and Depression. Not Present- Change in Sleep Pattern, Fearful and Frequent crying. Endocrine Not Present- Cold Intolerance, Excessive Hunger, Hair Changes, Heat Intolerance, Hot flashes and New Diabetes. Hematology Not Present- Blood Thinners, Easy Bruising, Excessive bleeding, Gland problems, HIV and Persistent Infections.  Vitals (Tanisha A. Brown RMA; 09/27/2019 2:54 PM) 09/27/2019 2:53 PM Weight: 197 lb Height: 62in Body Surface Area: 1.9 m Body Mass Index: 36.03 kg/m  Temp.: 97.35F  Pulse: 102 (Regular)  BP: 138/86(Sitting, Left Arm, Standard)   Physical Exam  General: WN younger AA F who is alert and generally healthy appearing. She is wearing a mask. Skin: Inspection and palpation of the skin unremarkable.  Eyes: Conjunctivae white, pupils equal. Face, ears, nose, mouth, and throat: Face - normal. Normal ears and nose. Lips and teeth normal.  Neck: Supple. Trachea midline. No thyroid mass. Her left neck pain is better sincer her visit to the [facility] ER, they did put her on some antibiotics. Lymph Nodes: Her right supraclavicular and right axillary nodes are all now non palpable.  Lungs: Normal respiratory effort. Clear to auscultation and symmetric breath sounds. Cardiovascular: Regular rate and rythm. Normal auscultation of the heart. No murmur or rub.  Breasts: Right - Much improvement in the size, density, and nodularity in the right breast. Unfortunately, I have not seen her since April. She does have this pigmentation to the skin, particularly the upper 1/2 of the breast. The pigmentation is not nodular, and is broad enough to probably no be able to get around at the time of mastectomy. I don't think that it is cancer - but not sure [place] Scrape is a photo in Epic of the right breast] Left - normal with no mass or nodule  Abdomen: Soft. No mass. Liver and spleen not palpable. No tenderness. No  hernia. Normal bowel sounds.  She has eczema (vs psoriasis) over her breast and skin of her lower chest and abdomen.  Musculoskeletal/extremities: Normal gait. Good strength and ROM in upper and lower extremities.   Assessment & Plan  1.  BREAST CANCER, STAGE 3, RIGHT (C50.911) Story: Right breast biopsy x 2 and right axillary node - 04/19/2019 ([id number]) - IDC, grade 3, triple negative, Ki67 - 90%, Right axillary node POSITIVE for metastatic cancer  Oncology - Magrinat and Lisbeth Renshaw  She has completed neoadjuvant chemotx   Plan:  1) Right mastectomy with right axillary node dissection   2.  SMOKES (F17.200)  Trying to quit smoking 3. History of asthma 4. 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 It sounds like her abuse was mainly alcohol and pain meds.  Sees Dr. Lillia Corporal  She was hospitalized [facility]) for 65 days in 2016 to detox. 5. HTN  History of preeclampsia 6. Asthma  On an inhaler 7. Muscle spasm  On Flexeril  Alphonsa Overall, MD, [facility] Surgery Office phone:  [phone]

## 2019-10-15 NOTE — Anesthesia Preprocedure Evaluation (Addendum)
Anesthesia Evaluation  Patient identified by MRN, date of birth, ID band Patient awake    Reviewed: Allergy & Precautions, NPO status , Patient's Chart, lab work & pertinent test results  Airway Mallampati: I       Dental no notable dental hx. (+) Teeth Intact   Pulmonary Current Smoker and Patient abstained from smoking., former smoker,    Pulmonary exam normal breath sounds clear to auscultation       Cardiovascular hypertension, Pt. on medications Normal cardiovascular exam Rhythm:Regular Rate:Normal     Neuro/Psych PSYCHIATRIC DISORDERS Anxiety Depression    GI/Hepatic GERD  Medicated and Controlled,  Endo/Other    Renal/GU   negative genitourinary   Musculoskeletal   Abdominal (+) + obese,   Peds  Hematology   Anesthesia Other Findings   Reproductive/Obstetrics                            Anesthesia Physical Anesthesia Plan  ASA: II  Anesthesia Plan: General   Post-op Pain Management:  Regional for Post-op pain   Induction: Intravenous  PONV Risk Score and Plan: Ondansetron  Airway Management Planned: LMA  Additional Equipment: None  Intra-op Plan:   Post-operative Plan: Extubation in OR  Informed Consent: I have reviewed the patients History and Physical, chart, labs and discussed the procedure including the risks, benefits and alternatives for the proposed anesthesia with the patient or authorized representative who has indicated his/her understanding and acceptance.     Dental advisory given  Plan Discussed with: CRNA  Anesthesia Plan Comments: (PAT note written 10/15/2019 by Myra Gianotti, PA-C. )      Anesthesia Quick Evaluation

## 2019-10-15 NOTE — Progress Notes (Signed)
Anesthesia Chart Review:  Case: [id number] Date/Time: 10/16/19 1415   Procedure: RIGHT MASTECTOMY WITH AXILLARY LYMPH NODE DISSECTION (Right Breast)   Anesthesia type: General   Pre-op diagnosis: right breast cancer   Location: [facility] OR ROOM 02 / [place] OR   Surgeon: Alphonsa Overall, MD      DISCUSSION: Patient is a 33 year old female scheduled for the above procedure. She was diagnosed with right breast cancer shortly after finding of an incidental right breast mass on CT scans for evaluation of left neck "knot" in 03/2019.   History includes former smoker, right breast cancer (Port-a-cath placed 04/30/19; s/p carboplatin and paclitaxel [discontinued 07/03/19 due to neuropathy], s/p cyclophosphamide and doxorubicin 07/03/19-08/29/19; goserelin started 05/08/19; pembrolizumab planned through 04/2020), pregnancy induced HTN, asthma, anxiety, depression, GERD, psoriasis, chronic pain, history of substance abuse (on Subutex).  BMI is consistent with obesity.  - Received gosereline and pembrolizumab last on 10/02/19. Dr. Jana Hakim is currently following TSH: was 1.889 05/08/19 at baseline the day chemotherapy was started; serial monitoring since showed TSH 0.235 (L) 09/12/19 and 0.241 (L) 10/02/19. No specific treatment initiated at this point. He saw her with labs on 10/02/19 and is aware of surgery plans.   Negative COVID-19 test 10/12/19.  If no acute changes and I would anticipate that she could proceed as planned.   VS: BP 111/80   Pulse 87   Temp (!) 36.2 C (Temporal)   Resp 18   Ht 5\' 2"  (1.575 m)   Wt 86.8 kg   SpO2 100%   BMI 34.99 kg/m   PROVIDERS: Patient, No Pcp Per Lurline Del, MD is HEM-ONC. Last visit 10/02/19.    LABS: She had labs on 10/02/19 with results including: Lab Results  Component Value Date   WBC 4.4 10/02/2019   HGB 10.3 (L) 10/02/2019   HCT 31.5 (L) 10/02/2019   PLT 243 10/02/2019   GLUCOSE 96 10/02/2019   ALT 14 10/02/2019   AST 19 10/02/2019   NA 141 10/02/2019   K 3.2  (L) 10/02/2019   CL 105 10/02/2019   CREATININE 0.77 10/02/2019   BUN 7 10/02/2019   CO2 26 10/02/2019   TSH 0.241 (L) 10/02/2019   INR 1.39 09/23/2018     IMAGES: 1V PCXR 04/30/19 (post-PAC placement): FINDINGS: The mediastinal contour is normal. The heart size is probably mildly enlarged. The lung volumes are low. A left central venous line is identified with distal tip in the right atrium. There is no pneumothorax. There is enlargement of central pulmonary vessel caliber which may be due to low lung volumes. There is no focal pneumonia or pleural effusion. The visualized skeletal structures are unremarkable. IMPRESSION: - Left central venous line distal tip in the right atrium. No pneumothorax. - Low lung volumes. Enlargement of central pulmonary vessel caliber bilaterally which may be due to low lung volumes.  CT Abd/pelvis 05/09/19: IMPRESSION: 1. Pulmonary nodules in the lung bases concerning for metastatic disease. 2. No evidence for soft tissue metastatic disease in the abdomen or pelvis. Attention to a tiny low-density lesion in the lateral segment left liver is recommended on follow-up. 3. Sclerosis at the right SI joint, likely degenerative or inflammatory. No appreciable uptake on bone scan today to suggest metastatic involvement.  CT Chest 04/11/19: IMPRESSION: 1. Complex right breast mass measuring 5.6 x 4.5 x 4.7 cm consistent with a primary breast carcinoma. 2. Malignant right axillary adenopathy measuring up to 3.1 cm. 3. Multiple bilateral pulmonary nodules are concerning for metastatic disease. 4. Given  the right breast mass, the left cervical lymph nodes are more likely malignant.  CT soft tissue neck 04/11/19: IMPRESSION: 1. Enlarged left level 2 lymph nodes with associated inflammatory change including stranding of the adjacent fat and thickening of the platysma. While malignancy is not excluded, this is most concerning for infection. No focal  abscess is present. 2. Hypoattenuation within 1 of the left level 2 lymph nodes likely represents central necrosis or suppuration of the node. 3. No primary malignancy or other abscess. 4. Left upper lobe pulmonary nodules may be inflammatory. The largest measures 7 x 7 x 6 mm. CT of the chest with contrast may be useful for further evaluation.   EKG: 04/26/19: Normal sinus rhythm Poor R wave progression precordial leads Abnormal ECG No previous tracing Confirmed by Lauree Chandler [phone]) on 04/26/2019 3:50:34 PM   CV: Echo 05/01/19: IMPRESSIONS  1. The left ventricle has normal systolic function with an ejection fraction of 60-65%. The cavity size was normal. Left ventricular diastolic parameters were normal.  2. The mitral valve is grossly normal.  3. The tricuspid valve is grossly normal.  4. The aortic valve is tricuspid. No stenosis of the aortic valve.  5. Normal LV function; GLS -19.5%.   Past Medical History:  Diagnosis Date  . Anemia    after pregnancy  . Anxiety    severe not taking meds  . Asthma    inhaler  4x per day  . Chronic pain   . Depression   . Family history of breast cancer   . GERD (gastroesophageal reflux disease)   . History of substance abuse ([place])   . Pregnancy induced hypertension    takes medication now.  . Psoriasis   . Trichomonas infection   . Vaginal Pap smear, abnormal     Past Surgical History:  Procedure Laterality Date  . CESAREAN SECTION  05/26/2008  . CESAREAN SECTION N/A 07/10/2017   Procedure: CESAREAN SECTION;  Surgeon: Florian Buff, MD;  Location: [place];  Service: Obstetrics;  Laterality: N/A;  . CESAREAN SECTION N/A 09/23/2018   Procedure: CESAREAN SECTION;  Surgeon: Donnamae Jude, MD;  Location: [place];  Service: Obstetrics;  Laterality: N/A;  . COLPOSCOPY    . PORTACATH PLACEMENT Left 04/30/2019   Procedure: INSERTION PORT-A-CATH WITH ULTRASOUND;  Surgeon: Alphonsa Overall, MD;  Location: [facility];  Service: General;  Laterality: Left;  Left Subclavian vein  . WISDOM TOOTH EXTRACTION      MEDICATIONS: . acyclovir ointment (ZOVIRAX) 5 %  . albuterol (PROVENTIL HFA;VENTOLIN HFA) 108 (90 Base) MCG/ACT inhaler  . buprenorphine (SUBUTEX) 8 MG SUBL SL tablet  . cholestyramine (QUESTRAN) 4 g packet  . cyclobenzaprine (FLEXERIL) 10 MG tablet  . enalapril (VASOTEC) 5 MG tablet  . gabapentin (NEURONTIN) 300 MG capsule  . hydrocortisone (ANUSOL-HC) 2.5 % rectal cream  . ketoconazole (NIZORAL) 2 % cream  . omeprazole (PRILOSEC) 40 MG capsule  . ondansetron (ZOFRAN) 8 MG tablet  . PARoxetine (PAXIL) 10 MG tablet  . Prenatal MV-Min-FA-Omega-3 (PRENATAL GUMMIES/DHA & FA) 0.4-32.5 MG CHEW  . valACYclovir (VALTREX) 1000 MG tablet   No current facility-administered medications for this encounter.    Marland Kitchen 0.9 % NaCl with KCl 20 mEq/ L  infusion  . sodium chloride flush (NS) 0.9 % injection 10 mL    Myra Gianotti, PA-C Surgical Short Stay/Anesthesiology [facility] Phone [phone] [facility] Phone [phone] 10/15/2019 1:41 PM

## 2019-10-16 ENCOUNTER — Encounter (HOSPITAL_COMMUNITY)
Admission: RE | Disposition: A | Discharge status: Home / Self Care | Payer: Self-pay | Source: Home / Self Care | Attending: Surgery

## 2019-10-16 ENCOUNTER — Encounter (HOSPITAL_COMMUNITY): Payer: Self-pay | Admitting: Anesthesiology

## 2019-10-16 ENCOUNTER — Observation Stay (HOSPITAL_COMMUNITY)
Admission: RE | Admit: 2019-10-16 | Discharge: 2019-10-17 | Disposition: A | Discharge status: Home / Self Care | Payer: Medicaid Other | Attending: Surgery | Admitting: Surgery

## 2019-10-16 ENCOUNTER — Ambulatory Visit (HOSPITAL_COMMUNITY): Payer: Medicaid Other | Admitting: Vascular Surgery

## 2019-10-16 ENCOUNTER — Other Ambulatory Visit: Payer: Self-pay

## 2019-10-16 ENCOUNTER — Ambulatory Visit (HOSPITAL_COMMUNITY): Payer: Medicaid Other | Admitting: Anesthesiology

## 2019-10-16 DIAGNOSIS — Z171 Estrogen receptor negative status [ER-]: Secondary | ICD-10-CM | POA: Insufficient documentation

## 2019-10-16 DIAGNOSIS — I10 Essential (primary) hypertension: Secondary | ICD-10-CM | POA: Diagnosis not present

## 2019-10-16 DIAGNOSIS — K219 Gastro-esophageal reflux disease without esophagitis: Secondary | ICD-10-CM | POA: Insufficient documentation

## 2019-10-16 DIAGNOSIS — F17200 Nicotine dependence, unspecified, uncomplicated: Secondary | ICD-10-CM | POA: Diagnosis not present

## 2019-10-16 DIAGNOSIS — F419 Anxiety disorder, unspecified: Secondary | ICD-10-CM | POA: Insufficient documentation

## 2019-10-16 DIAGNOSIS — C50911 Malignant neoplasm of unspecified site of right female breast: Secondary | ICD-10-CM | POA: Diagnosis present

## 2019-10-16 DIAGNOSIS — F329 Major depressive disorder, single episode, unspecified: Secondary | ICD-10-CM | POA: Diagnosis not present

## 2019-10-16 DIAGNOSIS — C50811 Malignant neoplasm of overlapping sites of right female breast: Secondary | ICD-10-CM

## 2019-10-16 DIAGNOSIS — M62838 Other muscle spasm: Secondary | ICD-10-CM | POA: Insufficient documentation

## 2019-10-16 DIAGNOSIS — Z803 Family history of malignant neoplasm of breast: Secondary | ICD-10-CM | POA: Insufficient documentation

## 2019-10-16 DIAGNOSIS — E860 Dehydration: Secondary | ICD-10-CM

## 2019-10-16 DIAGNOSIS — J45909 Unspecified asthma, uncomplicated: Secondary | ICD-10-CM | POA: Insufficient documentation

## 2019-10-16 DIAGNOSIS — Z79899 Other long term (current) drug therapy: Secondary | ICD-10-CM | POA: Insufficient documentation

## 2019-10-16 DIAGNOSIS — D649 Anemia, unspecified: Secondary | ICD-10-CM

## 2019-10-16 DIAGNOSIS — C773 Secondary and unspecified malignant neoplasm of axilla and upper limb lymph nodes: Secondary | ICD-10-CM | POA: Diagnosis not present

## 2019-10-16 DIAGNOSIS — C50211 Malignant neoplasm of upper-inner quadrant of right female breast: Secondary | ICD-10-CM | POA: Diagnosis not present

## 2019-10-16 HISTORY — PX: MASTECTOMY WITH AXILLARY LYMPH NODE DISSECTION: SHX5661

## 2019-10-16 LAB — POCT PREGNANCY, URINE: Preg Test, Ur: NEGATIVE

## 2019-10-16 SURGERY — MASTECTOMY WITH AXILLARY LYMPH NODE DISSECTION
Anesthesia: General | Site: Breast | Laterality: Right

## 2019-10-16 MED ORDER — KCL IN DEXTROSE-NACL 20-5-0.45 MEQ/L-%-% IV SOLN
INTRAVENOUS | Status: DC
  Administered 2019-10-16: 21:00:00 via INTRAVENOUS
  Filled 2019-10-16: qty 2000

## 2019-10-16 MED ORDER — TRAMADOL HCL 50 MG PO TABS
50.0000 mg | ORAL_TABLET | Freq: Four times a day (QID) | ORAL | Status: DC | PRN
  Administered 2019-10-16: 50 mg via ORAL

## 2019-10-16 MED ORDER — ONDANSETRON 4 MG PO TBDP: 4.0000 mg | ORAL_TABLET | Freq: Four times a day (QID) | ORAL | Status: DC | PRN

## 2019-10-16 MED ORDER — GABAPENTIN 300 MG PO CAPS
300.0000 mg | ORAL_CAPSULE | Freq: Three times a day (TID) | ORAL | Status: DC
  Administered 2019-10-16: 300 mg via ORAL
  Filled 2019-10-16: qty 1

## 2019-10-16 MED ORDER — FENTANYL CITRATE (PF) 250 MCG/5ML IJ SOLN
INTRAMUSCULAR | Status: DC | PRN
  Administered 2019-10-16 (×13): 50 ug via INTRAVENOUS

## 2019-10-16 MED ORDER — ONDANSETRON HCL 4 MG/2ML IJ SOLN
INTRAMUSCULAR | Status: DC | PRN
  Administered 2019-10-16: 4 mg via INTRAVENOUS

## 2019-10-16 MED ORDER — CEFAZOLIN SODIUM-DEXTROSE 2-4 GM/100ML-% IV SOLN
2.0000 g | INTRAVENOUS | Status: AC
  Administered 2019-10-16: 2 g via INTRAVENOUS
  Filled 2019-10-16: qty 100

## 2019-10-16 MED ORDER — FENTANYL CITRATE (PF) 100 MCG/2ML IJ SOLN
100.0000 ug | Freq: Once | INTRAMUSCULAR | Status: AC
  Administered 2019-10-16: 14:00:00 100 ug via INTRAVENOUS

## 2019-10-16 MED ORDER — CELECOXIB 200 MG PO CAPS
200.0000 mg | ORAL_CAPSULE | ORAL | Status: AC
  Administered 2019-10-16: 13:00:00 200 mg via ORAL
  Filled 2019-10-16: qty 1

## 2019-10-16 MED ORDER — BUPIVACAINE-EPINEPHRINE (PF) 0.25% -1:200000 IJ SOLN
INTRAMUSCULAR | Status: AC
  Filled 2019-10-16: qty 30

## 2019-10-16 MED ORDER — PROPOFOL 10 MG/ML IV BOLUS
INTRAVENOUS | Status: AC
  Filled 2019-10-16: qty 20

## 2019-10-16 MED ORDER — SODIUM CHLORIDE 0.9% FLUSH: 3.0000 mL | INTRAVENOUS | Status: DC | PRN

## 2019-10-16 MED ORDER — KETOROLAC TROMETHAMINE 30 MG/ML IJ SOLN
30.0000 mg | Freq: Once | INTRAMUSCULAR | Status: AC | PRN
  Administered 2019-10-16: 30 mg via INTRAVENOUS

## 2019-10-16 MED ORDER — HYDROMORPHONE HCL 1 MG/ML IJ SOLN: 0.2500 mg | INTRAMUSCULAR | Status: DC | PRN

## 2019-10-16 MED ORDER — ONDANSETRON HCL 4 MG/2ML IJ SOLN: 4.0000 mg | Freq: Four times a day (QID) | INTRAMUSCULAR | Status: DC | PRN

## 2019-10-16 MED ORDER — FENTANYL CITRATE (PF) 100 MCG/2ML IJ SOLN
INTRAMUSCULAR | Status: AC
  Administered 2019-10-16: 100 ug via INTRAVENOUS
  Filled 2019-10-16: qty 2

## 2019-10-16 MED ORDER — ROPIVACAINE HCL 5 MG/ML IJ SOLN
INTRAMUSCULAR | Status: DC | PRN
  Administered 2019-10-16 (×6): 5 mL via PERINEURAL

## 2019-10-16 MED ORDER — LIDOCAINE 2% (20 MG/ML) 5 ML SYRINGE
INTRAMUSCULAR | Status: DC | PRN
  Administered 2019-10-16: 80 mg via INTRAVENOUS

## 2019-10-16 MED ORDER — LACTATED RINGERS IV SOLN
INTRAVENOUS | Status: DC
  Administered 2019-10-16 (×2): via INTRAVENOUS

## 2019-10-16 MED ORDER — ALBUMIN HUMAN 5 % IV SOLN
INTRAVENOUS | Status: DC | PRN
  Administered 2019-10-16: 15:00:00 via INTRAVENOUS

## 2019-10-16 MED ORDER — CHLORHEXIDINE GLUCONATE CLOTH 2 % EX PADS
6.0000 | MEDICATED_PAD | Freq: Every day | CUTANEOUS | Status: DC
  Administered 2019-10-16: 6 via TOPICAL

## 2019-10-16 MED ORDER — MIDAZOLAM HCL 2 MG/2ML IJ SOLN
2.0000 mg | Freq: Once | INTRAMUSCULAR | Status: AC
  Administered 2019-10-16: 14:00:00 2 mg via INTRAVENOUS

## 2019-10-16 MED ORDER — 0.9 % SODIUM CHLORIDE (POUR BTL) OPTIME
TOPICAL | Status: DC | PRN
  Administered 2019-10-16: 1000 mL

## 2019-10-16 MED ORDER — FENTANYL CITRATE (PF) 250 MCG/5ML IJ SOLN
INTRAMUSCULAR | Status: AC
  Filled 2019-10-16: qty 5

## 2019-10-16 MED ORDER — ENALAPRIL MALEATE 5 MG PO TABS
5.0000 mg | ORAL_TABLET | Freq: Every day | ORAL | Status: DC
  Filled 2019-10-16: qty 1

## 2019-10-16 MED ORDER — CHLORHEXIDINE GLUCONATE CLOTH 2 % EX PADS: 6.0000 | MEDICATED_PAD | Freq: Once | CUTANEOUS | Status: DC

## 2019-10-16 MED ORDER — SODIUM CHLORIDE 0.9 % IV SOLN
Freq: Once | INTRAVENOUS | Status: AC
  Administered 2019-10-16: 21:00:00 via INTRAVENOUS

## 2019-10-16 MED ORDER — PROMETHAZINE HCL 25 MG/ML IJ SOLN: 6.2500 mg | INTRAMUSCULAR | Status: DC | PRN

## 2019-10-16 MED ORDER — DEXAMETHASONE SODIUM PHOSPHATE 10 MG/ML IJ SOLN
INTRAMUSCULAR | Status: DC | PRN
  Administered 2019-10-16: 5 mg via INTRAVENOUS

## 2019-10-16 MED ORDER — PHENYLEPHRINE 40 MCG/ML (10ML) SYRINGE FOR IV PUSH (FOR BLOOD PRESSURE SUPPORT)
PREFILLED_SYRINGE | INTRAVENOUS | Status: DC | PRN
  Administered 2019-10-16 (×2): 120 ug via INTRAVENOUS
  Administered 2019-10-16 (×2): 80 ug via INTRAVENOUS

## 2019-10-16 MED ORDER — MIDAZOLAM HCL 2 MG/2ML IJ SOLN
INTRAMUSCULAR | Status: AC
  Administered 2019-10-16: 2 mg via INTRAVENOUS
  Filled 2019-10-16: qty 2

## 2019-10-16 MED ORDER — HYDROCODONE-ACETAMINOPHEN 5-325 MG PO TABS
1.0000 | ORAL_TABLET | ORAL | Status: DC | PRN
  Administered 2019-10-16 – 2019-10-17 (×2): 2 via ORAL
  Filled 2019-10-16 (×2): qty 2

## 2019-10-16 MED ORDER — ACETAMINOPHEN 325 MG PO TABS
650.0000 mg | ORAL_TABLET | Freq: Three times a day (TID) | ORAL | Status: DC
  Administered 2019-10-16 – 2019-10-17 (×2): 650 mg via ORAL
  Filled 2019-10-16 (×2): qty 2

## 2019-10-16 MED ORDER — HEPARIN SOD (PORK) LOCK FLUSH 100 UNIT/ML IV SOLN: 500.0000 [IU] | Freq: Every day | INTRAVENOUS | Status: DC | PRN

## 2019-10-16 MED ORDER — ONDANSETRON HCL 4 MG PO TABS: 4.0000 mg | ORAL_TABLET | Freq: Three times a day (TID) | ORAL | Status: DC | PRN

## 2019-10-16 MED ORDER — PROPOFOL 10 MG/ML IV BOLUS
INTRAVENOUS | Status: DC | PRN
  Administered 2019-10-16: 200 mg via INTRAVENOUS

## 2019-10-16 MED ORDER — ALBUTEROL SULFATE (2.5 MG/3ML) 0.083% IN NEBU
2.5000 mg | INHALATION_SOLUTION | Freq: Four times a day (QID) | RESPIRATORY_TRACT | Status: DC
  Administered 2019-10-16: 2.5 mg via RESPIRATORY_TRACT
  Filled 2019-10-16: qty 3

## 2019-10-16 MED ORDER — ACETAMINOPHEN 500 MG PO TABS
1000.0000 mg | ORAL_TABLET | ORAL | Status: AC
  Administered 2019-10-16: 1000 mg via ORAL
  Filled 2019-10-16: qty 2

## 2019-10-16 MED ORDER — PAROXETINE HCL 20 MG PO TABS
20.0000 mg | ORAL_TABLET | Freq: Every day | ORAL | Status: DC
  Filled 2019-10-16: qty 1

## 2019-10-16 MED ORDER — MORPHINE SULFATE (PF) 2 MG/ML IV SOLN
1.0000 mg | INTRAVENOUS | Status: DC | PRN
  Administered 2019-10-17: 2 mg via INTRAVENOUS
  Filled 2019-10-16: qty 1

## 2019-10-16 MED ORDER — KETOROLAC TROMETHAMINE 30 MG/ML IJ SOLN
INTRAMUSCULAR | Status: AC
  Filled 2019-10-16: qty 1

## 2019-10-16 MED ORDER — TRAMADOL HCL 50 MG PO TABS
ORAL_TABLET | ORAL | Status: AC
  Filled 2019-10-16: qty 1

## 2019-10-16 MED ORDER — MEPERIDINE HCL 25 MG/ML IJ SOLN: 6.2500 mg | INTRAMUSCULAR | Status: DC | PRN

## 2019-10-16 MED ORDER — ENOXAPARIN SODIUM 40 MG/0.4ML ~~LOC~~ SOLN
40.0000 mg | SUBCUTANEOUS | Status: DC
  Administered 2019-10-17: 40 mg via SUBCUTANEOUS
  Filled 2019-10-16: qty 0.4

## 2019-10-16 SURGICAL SUPPLY — 48 items
APPLIER CLIP 9.375 MED OPEN (MISCELLANEOUS) ×3
ATCH SMKEVC FLXB CAUT HNDSWH (FILTER) ×1 IMPLANT
BINDER BREAST LRG (GAUZE/BANDAGES/DRESSINGS) IMPLANT
BINDER BREAST XLRG (GAUZE/BANDAGES/DRESSINGS) IMPLANT
BINDER BREAST XXLRG (GAUZE/BANDAGES/DRESSINGS) ×2 IMPLANT
CANISTER SUCT 3000ML PPV (MISCELLANEOUS) ×3 IMPLANT
CLIP APPLIE 9.375 MED OPEN (MISCELLANEOUS) ×1 IMPLANT
COVER SURGICAL LIGHT HANDLE (MISCELLANEOUS) ×3 IMPLANT
COVER WAND RF STERILE (DRAPES) ×3 IMPLANT
DERMABOND ADVANCED (GAUZE/BANDAGES/DRESSINGS) ×2
DERMABOND ADVANCED .7 DNX12 (GAUZE/BANDAGES/DRESSINGS) ×1 IMPLANT
DRAIN CHANNEL 19F RND (DRAIN) ×6 IMPLANT
DRAPE CHEST BREAST 15X10 FENES (DRAPES) ×3 IMPLANT
DRAPE HALF SHEET 40X57 (DRAPES) ×3 IMPLANT
DRSG PAD ABDOMINAL 8X10 ST (GAUZE/BANDAGES/DRESSINGS) ×4 IMPLANT
ELECT CAUTERY BLADE 6.4 (BLADE) ×3 IMPLANT
ELECT REM PT RETURN 9FT ADLT (ELECTROSURGICAL) ×6
ELECTRODE REM PT RTRN 9FT ADLT (ELECTROSURGICAL) ×2 IMPLANT
EVACUATOR SILICONE 100CC (DRAIN) ×6 IMPLANT
EVACUATOR SMOKE ACCUVAC VALLEY (FILTER) ×2
GAUZE SPONGE 4X4 12PLY STRL (GAUZE/BANDAGES/DRESSINGS) ×3 IMPLANT
GLOVE SURG SYN 7.5  E (GLOVE) ×2
GLOVE SURG SYN 7.5 E (GLOVE) ×1 IMPLANT
GLOVE SURG SYN 7.5 PF PI (GLOVE) ×1 IMPLANT
GOWN STRL REUS W/ TWL LRG LVL3 (GOWN DISPOSABLE) ×1 IMPLANT
GOWN STRL REUS W/ TWL XL LVL3 (GOWN DISPOSABLE) ×1 IMPLANT
GOWN STRL REUS W/TWL LRG LVL3 (GOWN DISPOSABLE) ×2
GOWN STRL REUS W/TWL XL LVL3 (GOWN DISPOSABLE) ×2
ILLUMINATOR WAVEGUIDE N/F (MISCELLANEOUS) IMPLANT
KIT BASIN OR (CUSTOM PROCEDURE TRAY) ×3 IMPLANT
KIT TURNOVER KIT B (KITS) ×3 IMPLANT
LIGHT WAVEGUIDE WIDE FLAT (MISCELLANEOUS) IMPLANT
NS IRRIG 1000ML POUR BTL (IV SOLUTION) ×3 IMPLANT
PACK GENERAL/GYN (CUSTOM PROCEDURE TRAY) ×3 IMPLANT
PAD ARMBOARD 7.5X6 YLW CONV (MISCELLANEOUS) ×3 IMPLANT
PENCIL SMOKE EVACUATOR (MISCELLANEOUS) ×3 IMPLANT
PREFILTER EVAC NS 1 1/3-3/8IN (MISCELLANEOUS) ×3 IMPLANT
SPECIMEN JAR X LARGE (MISCELLANEOUS) ×3 IMPLANT
SPONGE LAP 18X18 RF (DISPOSABLE) ×4 IMPLANT
SUT ETHILON 2 0 FS 18 (SUTURE) ×6 IMPLANT
SUT MNCRL AB 4-0 PS2 18 (SUTURE) ×5 IMPLANT
SUT SILK 3 0 (SUTURE)
SUT SILK 3-0 18XBRD TIE 12 (SUTURE) IMPLANT
SUT VIC AB 3-0 SH 18 (SUTURE) ×5 IMPLANT
TOWEL GREEN STERILE (TOWEL DISPOSABLE) ×3 IMPLANT
TOWEL GREEN STERILE FF (TOWEL DISPOSABLE) ×3 IMPLANT
TUBE CONNECTING 12'X1/4 (SUCTIONS) ×1
TUBE CONNECTING 12X1/4 (SUCTIONS) ×2 IMPLANT

## 2019-10-16 NOTE — Interval H&P Note (Signed)
History and Physical Interval Note:  10/16/2019 2:35 PM  Terri Estes  has presented today for surgery, with the diagnosis of right breast cancer.  The various methods of treatment have been discussed with the patient and family.   She is ready for surgery.    After consideration of risks, benefits and other options for treatment, the patient has consented to  Procedure(s): RIGHT MASTECTOMY WITH AXILLARY LYMPH NODE DISSECTION (Right) as a surgical intervention.  The patient's history has been reviewed, patient examined, no change in status, stable for surgery.  I have reviewed the patient's chart and labs.  Questions were answered to the patient's satisfaction.     Shann Medal

## 2019-10-16 NOTE — Transfer of Care (Signed)
Immediate Anesthesia Transfer of Care Note  Patient: Terri Estes  Procedure(s) Performed: RIGHT MASTECTOMY WITH AXILLARY LYMPH NODE DISSECTION (Right Breast)  Patient Location: PACU  Anesthesia Type:GA combined with regional for post-op pain  Level of Consciousness: awake, alert , oriented and patient cooperative  Airway & Oxygen Therapy: Patient Spontanous Breathing and Patient connected to nasal cannula oxygen  Post-op Assessment: Report given to RN and Post -op Vital signs reviewed and stable  Post vital signs: Reviewed and stable  Last Vitals:  Vitals Value Taken Time  BP 118/91 10/16/19 1715  Temp 36.1 C 10/16/19 1700  Pulse 62 10/16/19 1729  Resp 11 10/16/19 1729  SpO2 100 % 10/16/19 1729  Vitals shown include unvalidated device data.  Last Pain:  Vitals:   10/16/19 1700  TempSrc:   PainSc: 0-No pain      Patients Stated Pain Goal: 3 ([id number])  Complications: No apparent anesthesia complications

## 2019-10-16 NOTE — Anesthesia Procedure Notes (Signed)
Procedure Name: LMA Insertion Date/Time: 10/16/2019 2:52 PM Performed by: Janace Litten, CRNA Pre-anesthesia Checklist: Patient identified, Emergency Drugs available, Suction available and Patient being monitored Patient Re-evaluated:Patient Re-evaluated prior to induction Oxygen Delivery Method: Circle System Utilized Preoxygenation: Pre-oxygenation with 100% oxygen Induction Type: IV induction Ventilation: Mask ventilation without difficulty LMA: LMA inserted LMA Size: 4.0 Number of attempts: 1 Airway Equipment and Method: Bite block Placement Confirmation: positive ETCO2 Tube secured with: Tape Dental Injury: Teeth and Oropharynx as per pre-operative assessment

## 2019-10-16 NOTE — Anesthesia Procedure Notes (Signed)
Anesthesia Regional Block: Pectoralis block   Pre-Anesthetic Checklist: ,, timeout performed, Correct Patient, Correct Site, Correct Laterality, Correct Procedure, Correct Position, site marked, Risks and benefits discussed,  Surgical consent,  Pre-op evaluation,  At surgeon's request and post-op pain management  Laterality: Right and N/A  Prep: chloraprep       Needles:  Injection technique: Single-shot  Needle Type: Echogenic Stimulator Needle     Needle Length: 10cm  Needle Gauge: 21   Needle insertion depth: 3 cm   Additional Needles:   Procedures:,,,, ultrasound used (permanent image in chart),,,,  Narrative:  Start time: 10/16/2019 1:50 PM End time: 10/16/2019 2:10 PM Injection made incrementally with aspirations every 5 mL.  Performed by: Personally  Anesthesiologist: Lyn Hollingshead, MD

## 2019-10-16 NOTE — Progress Notes (Signed)
Called Dr Jillyn Hidden regarding patient's Covid Test - negative.  However, patient lives with fiance and he's has not been wearing his mask.  Per Dr Jillyn Hidden, patient does not need to be retested for Covid.  Blair at the [facility] desk was notified.

## 2019-10-16 NOTE — Op Note (Signed)
10/16/2019  4:52 PM  PATIENT:  Terri Estes, 33 y.o., female, MRN: [id number]  PREOP DIAGNOSIS:  right breast cancer  POSTOP DIAGNOSIS:   Right breast cancer, 2 o'clock position (T3, N1)  PROCEDURE:   Procedure(s):  RIGHT MASTECTOMY WITH AXILLARY LYMPH NODE DISSECTION  SURGEON:   Alphonsa Overall, M.D.  ASSISTANT:   None  ANESTHESIA:  General  Anesthesiologist: Lillia Abed, MD; Catalina Gravel, MD CRNA: Janace Litten, CRNA  General  ASA:  2  EBL:  150  ml  BLOOD ADMINISTERED: none  DRAINS: 2 19 F Blake drain  LOCAL MEDICATIONS USED:   right pectoral block  SPECIMEN:   Right mastectomy (suture lateral), right axillary fat pad, superior mastectomy skin (long suture lateral, short suture inferior)  COUNTS CORRECT:  YES  INDICATIONS FOR PROCEDURE:  Terri Estes is a 33 y.o. (DOB: 08-27-1986) AA female whose primary care physician is Patient, No Pcp Per and comes for right modified radical mastectomy.   She was originally diagnosed with breast cancer on 04/19/2019 with a biopsy of her right breast and right axilla.  She has completed neoadjuvant chemotherpay supervised by Dr. Jana Hakim.  She has also seen Dr. Lisbeth Renshaw for future radiation therapy.   The indications and risks of the surgery were explained to the patient.  The risks include, but are not limited to, infection, bleeding, and nerve injury.  OPERATIVE NOTE;  The patient was taken to room # 2 at [facility] where she underwent a general anesthesia  supervised by Anesthesiologist: Lillia Abed, MD; Catalina Gravel, MD CRNA: Janace Litten, CRNA. Her right breast and axilla were prepped with ChloraPrep and sterilely draped.    A time-out and the surgical check list was reviewed.    I made an elliptical incision including the areola in the right breast.  I developed skin flaps medially to the lateral edge of the sternum, inferiorly to the investing fascia of the rectus abdominus muscle, laterally  to the anterior edge of the latissimus dorsi muscle, and superiorly to about 2 finger breaths below the clavicle.  The breast was reflected off the pectoralis muscle from medial to lateral.  A long suture was placed on the lateral aspect of the breast.   I continued the dissection into the right axilla the right axillary vein.  I swept all the fat and nodes below the vein with the specimen.  I identified and tried to spare the long thoracic nerve of Bell and the thoracodorsal nerve.  There was a pad of fat, high in the rigth axilla, that contained at least one node.  I sent this as a separate specimen.    I brought out 2 63 F Blake drains below the inferior flaps.  These were sewn in place with 2-0 Nylons.  I irrigated the wound with 2,000 cc of fluid.   I excised excess superior skin.  I marked the skin with a long suture laterally and a short suture inferiorly.   The skin was closed with interrupted 3-0 Vicryl sutures and the skin was closed with a 4-0 Monocryl.  The wound was painted with Dermabond.    A pressure dressing was placed on the wound and the chest wrapped with a breast binder.  Her needle and sponge count were correct at the end of the case.   She was transferred to the recovery room in good condition.  Alphonsa Overall, MD, [facility] Surgery Pager: [phone] Office phone:  [phone]

## 2019-10-16 NOTE — Anesthesia Postprocedure Evaluation (Signed)
Anesthesia Post Note  Patient: Terri Estes  Procedure(s) Performed: RIGHT MASTECTOMY WITH AXILLARY LYMPH NODE DISSECTION (Right Breast)     Patient location during evaluation: PACU Anesthesia Type: General Level of consciousness: awake and alert Pain management: pain level controlled Vital Signs Assessment: post-procedure vital signs reviewed and stable Respiratory status: spontaneous breathing, nonlabored ventilation, respiratory function stable and patient connected to nasal cannula oxygen Cardiovascular status: blood pressure returned to baseline and stable Postop Assessment: no apparent nausea or vomiting Anesthetic complications: no    Last Vitals:  Vitals:   10/16/19 1715 10/16/19 1730  BP: (!) 118/91 116/86  Pulse: 75 64  Resp: 12 13  Temp:    SpO2: 100% 100%    Last Pain:  Vitals:   10/16/19 1730  TempSrc:   PainSc: Asleep                 Linnaea Ahn DAVID

## 2019-10-17 ENCOUNTER — Encounter (HOSPITAL_COMMUNITY): Payer: Self-pay | Admitting: Surgery

## 2019-10-17 DIAGNOSIS — C50211 Malignant neoplasm of upper-inner quadrant of right female breast: Secondary | ICD-10-CM | POA: Diagnosis not present

## 2019-10-17 MED ORDER — HEPARIN SOD (PORK) LOCK FLUSH 100 UNIT/ML IV SOLN
500.0000 [IU] | INTRAVENOUS | Status: DC
  Filled 2019-10-17: qty 5

## 2019-10-17 MED ORDER — ALUM & MAG HYDROXIDE-SIMETH 200-200-20 MG/5ML PO SUSP
30.0000 mL | Freq: Four times a day (QID) | ORAL | Status: DC | PRN
  Administered 2019-10-17: 30 mL via ORAL
  Filled 2019-10-17: qty 30

## 2019-10-17 MED ORDER — ALBUTEROL SULFATE (2.5 MG/3ML) 0.083% IN NEBU
2.5000 mg | INHALATION_SOLUTION | Freq: Four times a day (QID) | RESPIRATORY_TRACT | Status: DC
  Administered 2019-10-17: 2.5 mg via RESPIRATORY_TRACT
  Filled 2019-10-17: qty 3

## 2019-10-17 MED ORDER — HYDROCODONE-ACETAMINOPHEN 5-325 MG PO TABS: 1.0000 | ORAL_TABLET | Freq: Four times a day (QID) | ORAL | 0 refills | Status: DC | PRN

## 2019-10-17 MED ORDER — SODIUM CHLORIDE 0.9% FLUSH: 10.0000 mL | INTRAVENOUS | Status: DC | PRN

## 2019-10-17 MED ORDER — HEPARIN SOD (PORK) LOCK FLUSH 100 UNIT/ML IV SOLN
500.0000 [IU] | INTRAVENOUS | Status: DC | PRN
  Administered 2019-10-17: 500 [IU]
  Filled 2019-10-17: qty 5

## 2019-10-17 NOTE — Discharge Instructions (Signed)
[facility] [place] [facility] - DISCHARGE INSTRUCTIONS TO PATIENT  Activity:  Driving - May drive in 3 days   Lifting - No lifting more than 15 pounds for 7 days, then no limit                       Practice your Covid-19 protection:  Wear a mask, social distance, and wash your hands frequently  Wound Care:   Leave the incision dry for 2 days, then you may shower         Empty drains twice a day and record the amount - bring this with you to the office  Diet:  As tolerated  Follow up appointment:  Call Dr. Pollie Friar office [facility]) at [phone] for an appointment in one week.  Medications and dosages:  Resume your home medications.  You have a prescription for:  Vicodin  Call Dr. Lucia Gaskins or his office  [phone]) if you have:  Temperature greater than 100.4,  Persistent nausea and vomiting,  Severe uncontrolled pain,  Redness, tenderness, or signs of infection (pain, swelling, redness, odor or green/yellow discharge around the site),  Difficulty breathing, headache or visual disturbances,  Any other questions or concerns you may have after discharge.  In an emergency, call 911 or go to an Emergency Department at a nearby hospital.

## 2019-10-17 NOTE — Progress Notes (Signed)
Patient requesting medication for acid reflux, Dr. Lucia Gaskins paged.

## 2019-10-17 NOTE — Progress Notes (Signed)
Patient discharged to home. Verbalizes understanding of all discharge instructions including incision/drain care, discharge medications, and follow up MD visits. Patient's family on face time for discharge instructions.

## 2019-10-17 NOTE — Discharge Summary (Signed)
Physician Discharge Summary  Patient ID:  Terri Estes  MRN: [id number]  DOB/AGE: 1986/09/11 33 y.o.  Admit date: 10/16/2019 Discharge date: 10/17/2019  Discharge Diagnoses:  1.  BREAST CANCER, STAGE 3, RIGHT (C50.911) Story: Right breast biopsy x 2 and right axillary node - 04/19/2019 ([id number]) - IDC, grade 3, triple negative, Ki67 - 90%, Right axillary node POSITIVE for metastatic cancer             Oncology - [facility]rin[place] and Lisbeth Renshaw             She has completed neoadjuvant chemotx 2.  SMOKES (F17.200) 3. History of asthma 4. History of substance abuse on Subutex        She said that she has been "clean" about 3 years. It sounds like her abuse was mainly alcohol and pain meds.        Sees Dr. Lillia Corporal        She was hospitalized [facility]) for 65 days in 2016 to detox. 5. HTN       History of preeclampsia 6. Asthma        On an inhaler 7. Muscle spasm        On Flexeril   Active Problems:   Breast cancer, stage 2, right ([place])  Operation: Procedure(s):  RIGHT MASTECTOMY WITH AXILLARY LYMPH NODE DISSECTION on 10/16/2019 Terri Estes  Discharged Condition: good  Hospital Course: Terri Estes is an 34 y.o. female whose primary care physician is Patient, No Pcp Per and who was admitted 10/16/2019 with a chief complaint of right breast cancer.   She was brought to the operating room on 10/16/2019 and underwent  RIGHT MASTECTOMY WITH AXILLARY LYMPH NODE DISSECTION.   She did well overnight and is now ready to go home.  The discharge instructions were reviewed with the patient.  Consults: None  Significant Diagnostic Studies: Results for orders placed or performed during the hospital encounter of 10/16/19  Pregnancy, urine POC  Result Value Ref Range   Preg Test, Ur NEGATIVE NEGATIVE    No results found.  Discharge Exam:  Vitals:   10/17/19 0124 10/17/19 0344  BP: 116/83 119/78  Pulse: 76 64   Resp: 18 18  Temp: 98.2 F (36.8 C) 98.3 F (36.8 C)  SpO2: 100% 100%    General: WN AA F who is alert and generally healthy appearing.  Lungs: Clear to auscultation and symmetric breath sounds. Heart:  RRR. No murmur or rub. Chest - wound looks good Extremities:  Good strength and ROM  in upper and lower extremities.  Discharge Medications:   Allergies as of 10/17/2019   No Known Allergies     Medication List    TAKE these medications   acyclovir ointment 5 % Commonly known as: Zovirax Apply 1 application topically 2 (two) times daily as needed. Mix 1 part acyclovir cream with 1 part over the counter hydrocortisone 1% cream and apply to affected area two times a day as needed   albuterol 108 (90 Base) MCG/ACT inhaler Commonly known as: VENTOLIN HFA Inhale 2 puffs into the lungs 4 (four) times daily.   buprenorphine 8 MG Subl SL tablet Commonly known as: SUBUTEX Place 8 mg under the tongue 2 (two) times daily.   cholestyramine 4 g packet Commonly known as: Questran Take 1 packet (4 g total) by mouth 3 (three) times daily with meals.   cyclobenzaprine 10 MG tablet Commonly known as: FLEXERIL Take 1 tablet (10 mg total)  by mouth 3 (three) times daily as needed for muscle spasms.   enalapril 5 MG tablet Commonly known as: VASOTEC Take 1 tablet (5 mg total) by mouth daily.   gabapentin 300 MG capsule Commonly known as: NEURONTIN TAKE 1 CAPSULE(300 MG) BY MOUTH THREE TIMES DAILY   HYDROcodone-acetaminophen 5-325 MG tablet Commonly known as: NORCO/VICODIN Take 1 tablet by mouth every 6 (six) hours as needed for moderate pain.   hydrocortisone 2.5 % rectal cream Commonly known as: Anusol-HC Place 1 application rectally 2 (two) times daily.   ketoconazole 2 % cream Commonly known as: NIZORAL Apply 1 application topically daily.   omeprazole 40 MG capsule Commonly known as: PRILOSEC Take 1 capsule (40 mg total) by mouth daily.   ondansetron 8 MG  tablet Commonly known as: ZOFRAN TAKE 1 TABLET(8 MG) BY MOUTH EVERY 8 HOURS AS NEEDED FOR NAUSEA OR VOMITING   PARoxetine 10 MG tablet Commonly known as: PAXIL Take 1 tablet (10 mg total) by mouth daily. What changed: additional instructions   Prenatal Gummies/DHA & FA 0.4-32.5 MG Chew Chew 2 each by mouth daily.   valACYclovir 1000 MG tablet Commonly known as: VALTREX Take 1 tablet (1,000 mg total) by mouth 2 (two) times daily.       Disposition: Discharge disposition: 01-Home or Self Care       Discharge Instructions    Diet - low sodium heart healthy   Complete by: As directed    Increase activity slowly   Complete by: As directed          Signed: Alphonsa Overall, M.D., [facility] Surgery Office:  [phone]  10/17/2019, 8:14 AM

## 2019-10-17 NOTE — Progress Notes (Signed)
Received pt in 6N from PACU, alert oriented x4 with VSS. Pt with two drains on the R breast with minimal output. Oriented to room, bed controls and plan of care. Left lying comfortably in bed with call bell at reach. Will continue to monitor.

## 2019-10-19 LAB — SURGICAL PATHOLOGY

## 2019-10-22 ENCOUNTER — Other Ambulatory Visit: Payer: Self-pay | Admitting: *Deleted

## 2019-10-22 ENCOUNTER — Telehealth: Payer: Self-pay | Admitting: Radiation Oncology

## 2019-10-22 DIAGNOSIS — C50811 Malignant neoplasm of overlapping sites of right female breast: Secondary | ICD-10-CM

## 2019-10-22 DIAGNOSIS — Z171 Estrogen receptor negative status [ER-]: Secondary | ICD-10-CM

## 2019-10-22 NOTE — Telephone Encounter (Signed)
New Message: ° ° °LVM for patient to return call to schedule appt from referral received. °

## 2019-10-24 ENCOUNTER — Telehealth: Payer: Self-pay | Admitting: Radiation Oncology

## 2019-10-24 NOTE — Telephone Encounter (Signed)
New Message: ° ° ° ° °LVM for patient to return call to schedule from referral received. °

## 2019-10-29 NOTE — Progress Notes (Signed)
[name]  [phone] Fax:([phone]) [phone]     PATIENT WAS UNABLE TO SHOW FOR 10/30/2019 VISIT--THIS IS BEING RESCHEDULED  ID: Terri Estes DOB: 1986-10-05  MR#: [id number]  IZT#:[id number]  Patient Care Team: Patient, No Pcp Per as PCP - General (General Practice) Alphonsa Overall, MD as Consulting Physician (General Surgery) , [name], MD as Consulting Physician (Oncology) Kyung Rudd, MD as Consulting Physician (Radiation Oncology) Rockwell Germany, RN as Oncology Nurse Navigator Mauro Kaufmann, RN as Oncology Nurse Navigator Donnamae Jude, MD as Consulting Physician (Obstetrics and Gynecology) [place], Ralene Bathe, MD as Consulting Physician (Internal Medicine) OTHER MD: Joylene Igo for [facility] is [phone]   CHIEF COMPLAINT: 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    She continues on pembrolizumab.  She tolerates this well, with no significant side effects although she says she thinks it makes her little bit thirsty for a few days.  She also continues on goserelin.  She is having significant hot flashes.  She says she feels like she has to walk into her refrigerator.  Since her last visit, she underwent right mastectomy with axillary lymph node dissection on 10/16/2019 under Dr. Lucia Gaskins. Pathology from the procedure ([id number]) showed: invasive ductal carcinoma, grade 3, 3.4 cm, with central cavitation; ductal carcinoma in situ, high grade; negative resection margins; negative for lymphovascular perineural invasion.   A total of 10 lymph nodes were biopsied at that time. Of these, a total of 5 were positive for metastatic carcinoma, with 3 of these showing extranodal extension.  We are following her TSH:  Lab Results  Component Value Date   TSH 0.241 (L) 10/02/2019   TSH 0.235 (L) 09/12/2019   TSH 1.889 05/08/2019   TSH 0.012 (L) 12/16/2016    REVIEW OF SYSTEMS: Terri Estes    HISTORY OF CURRENT ILLNESS: From the original intake note:  Terri Estes presented with swelling along the left side of the face with neck pain. She then underwent a neck CT on 04/11/2019 showing: Enlarged left level 2 lymph nodes with associated inflammatory change including stranding of the adjacent fat and thickening of the platysma. While malignancy is not excluded, this is most concerning for infection. No focal abscess is present. Hypoattenuation within 1 of the left level 2 lymph nodes likely represents central necrosis or suppuration of the node. No primary malignancy or other abscess. Left upper lobe pulmonary nodules may be inflammatory. The largest measures 0.7 x 0.7 x 0.6 cm. CT of the chest with contrast may be useful for further evaluation.  She also underwent a chest CT on the same day showing: Complex right breast mass measuring 5.6 x 4.5 x 4.7 cm consistent with a primary breast carcinoma. Malignant right axillary adenopathy measuring up to 3.1 cm. Multiple bilateral pulmonary nodules are concerning for metastatic disease. Given the right breast mass, the left cervical lymph nodes are more likely malignant.  She then underwent bilateral diagnostic mammography with tomography and right breast ultrasonography at [facility] on 04/19/2019 showing: Breast Density Category C; findings which are highly suspicious for multicentric inflammatory right breast cancer with right axillary nodal metastatic disease; no mammographic evidence of malignancy involving the left breast.  Accordingly on 04/19/2019 she proceeded to biopsy of the right breast area in question. The pathology from this procedure showed ([id number]): invasive ductal carcinoma, grade III, upper inner quadrant, 12:30  o'clock, 5.0 cm from the nipple. Prognostic indicators significant for: estrogen receptor, 0%  negative and progesterone receptor, 0% negative. Proliferation marker Ki67 at 90%. HER2 negative (0+).  Additional biopsies of the right breast and right axilla were performed on the same day. The pathology from this procedure showed ([id number]): 2. Breast, right, needle core biopsy, satellite mass UOQ, 10 o'clock, 8cmfn  - Invasive ductal carcinoma, grade III 3. Lymph node, needle/core biopsy, level 1 right axilla   - Invasive ductal carcinoma, grade III  The patient's subsequent history is as detailed below.   PAST MEDICAL HISTORY: Past Medical History:  Diagnosis Date  . Anemia    after pregnancy  . Anxiety    severe not taking meds  . Asthma    inhaler  4x per day  . Chronic pain   . Depression   . Family history of breast cancer   . GERD (gastroesophageal reflux disease)   . History of substance abuse ([place])   . Pregnancy induced hypertension    takes medication now.  . Psoriasis    all over body  . Trichomonas infection   . Vaginal Pap smear, abnormal     PAST SURGICAL HISTORY: Past Surgical History:  Procedure Laterality Date  . CESAREAN SECTION  05/26/2008  . CESAREAN SECTION N/A 07/10/2017   Procedure: CESAREAN SECTION;  Surgeon: Florian Buff, MD;  Location: [place];  Service: Obstetrics;  Laterality: N/A;  . CESAREAN SECTION N/A 09/23/2018   Procedure: CESAREAN SECTION;  Surgeon: Donnamae Jude, MD;  Location: [place];  Service: Obstetrics;  Laterality: N/A;  . COLPOSCOPY    . MASTECTOMY WITH AXILLARY LYMPH NODE DISSECTION Right 10/16/2019   Procedure: RIGHT MASTECTOMY WITH AXILLARY LYMPH NODE DISSECTION;  Surgeon: Alphonsa Overall, MD;  Location: [place];  Service: General;  Laterality: Right;  . PORTACATH PLACEMENT Left 04/30/2019   Procedure: INSERTION PORT-A-CATH WITH ULTRASOUND;  Surgeon: Alphonsa Overall, MD;  Location: [facility];  Service: General;  Laterality: Left;  Left Subclavian vein  . WISDOM TOOTH EXTRACTION      FAMILY HISTORY:  Family History  Problem Relation Age of Onset  . Kidney disease Maternal Grandmother   . Breast cancer Maternal Grandmother 28       metastatic to liver; d. 34  . Kidney disease Maternal Grandfather   . Kidney disease Paternal Grandmother   . Diabetes Paternal Grandmother   . Breast cancer Paternal Grandmother   . Kidney disease Paternal Grandfather   . Breast cancer Paternal Aunt   . Cerebral palsy Child   . Breast cancer Paternal Great-grandmother   . Pancreatic cancer Other        PGMs brother   . Throat cancer Paternal Uncle   . Breast cancer Other        PGMs sister  (Updated 04/25/2019) Ruthy's father is living at age 61. Patients' mother is also living at age 55. [place] notes, however, that she was raised by her grandmother.) The patient has 0 brothers and 5 sisters. Patient denies anyone in her family having ovarian, prostate, or pancreatic cancer. Her maternal grandmother was diagnosed with breast cancer, unsure what age. On her father's side, she reports her grandmother had cysts in her breasts, her great-aunt might have had breast cancer, and her great-grandmother was diagnosed with breast cancer at an older age.   GYNECOLOGIC HISTORY:  No LMP recorded (lmp unknown). (Menstrual status: Chemotherapy).  She had an emergent C-section in 09/23/2018 for abruptio placenta at 30  weeks pregnancy, also has a history of eclampsia Menarche:    years old Age at first live birth: 33 years old Hasty P: 3 LMP: 04/18/2019 Contraceptive: none HRT: none  Hysterectomy?: no BSO?: no   SOCIAL HISTORY: (Current as of August 2020) Terri Estes is currently unemployed. She previously worked at [employer]. She is currently engaged. Fiance [name] runs a Mid[employer] and works other odd jobs.  The patient recently moved to [place].  Daughter Demetrius Charity, age 2, was abused by her father at 70 months and has cerebral palsy. Son [name], age 19, has severe asthma and is developmentally  disabled. Daughter Yetta Glassman was born on 09/23/2018 prematurely and remains on oxygen at home.  The patient's aunt has been staying with her and is helping with the children and providing general support   ADVANCED DIRECTIVES: not in place.    HEALTH MAINTENANCE: Social History   Tobacco Use  . Smoking status: Current Every Day Smoker    Packs/day: 0.10    Years: 0.00    Pack years: 0.00    Types: Cigarettes  . Smokeless tobacco: Never Used  . Tobacco comment: trying to quit - smokes 2 cigarettes/day  Substance Use Topics  . Alcohol use: No  . Drug use: Yes    Frequency: 7.0 times per week    Types: Marijuana    Comment: subutex last dose 10/15/19, last dose Sun 10/14/19    Colonoscopy: never done  PAP: 05/10/2018  Bone density: never done Mammography: first performed following abnormal CT  No Known Allergies   OBJECTIVE: Young African-American woman   There were no vitals filed for this visit. Wt Readings from Last 3 Encounters:  10/16/19 191 lb 4.8 oz (86.8 kg)  10/12/19 191 lb 4.8 oz (86.8 kg)  10/02/19 195 lb 12.8 oz (88.8 kg)   There is no height or weight on file to calculate BMI.    ECOG FS:1 - Symptomatic but completely ambulatory     LAB RESULTS:  CMP     Component Value Date/Time   NA 141 10/02/2019 1248   NA 137 05/10/2018 1535   K 3.2 (L) 10/02/2019 1248   CL 105 10/02/2019 1248   CO2 26 10/02/2019 1248   GLUCOSE 96 10/02/2019 1248   BUN 7 10/02/2019 1248   BUN 7 05/10/2018 1535   CREATININE 0.77 10/02/2019 1248   CREATININE 0.81 07/03/2019 0919   CALCIUM 9.2 10/02/2019 1248   PROT 6.6 10/02/2019 1248   PROT 7.2 05/10/2018 1535   ALBUMIN 3.9 10/02/2019 1248   ALBUMIN 4.2 05/10/2018 1535   AST 19 10/02/2019 1248   AST 54 (H) 07/03/2019 0919   ALT 14 10/02/2019 1248   ALT 102 (H) 07/03/2019 0919   ALKPHOS 75 10/02/2019 1248   BILITOT 0.3 10/02/2019 1248   BILITOT <0.2 (L) 07/03/2019 0919   GFRNONAA >60 10/02/2019 1248   GFRNONAA >60  07/03/2019 0919   GFRAA >60 10/02/2019 1248   GFRAA >60 07/03/2019 0919    No results found for: TOTALPROTELP, ALBUMINELP, A1GS, A2GS, BETS, BETA2SER, GAMS, MSPIKE, SPEI  No results found for: KPAFRELGTCHN, LAMBDASER, KAPLAMBRATIO  Lab Results  Component Value Date   WBC 4.4 10/02/2019   NEUTROABS 2.8 10/02/2019   HGB 10.3 (L) 10/02/2019   HCT 31.5 (L) 10/02/2019   MCV 101.0 (H) 10/02/2019   PLT 243 10/02/2019    '@LASTCHEMISTRY' @  No results found for: LABCA2  No components found for: NOBSJG283  No results for input(s): INR in the last  168 hours.  No results found for: LABCA2  No results found for: SLP530  No results found for: YFR102  No results found for: TRZ735  No results found for: CA2729  No components found for: HGQUANT  No results found for: CEA1 / No results found for: CEA1   No results found for: AFPTUMOR  No results found for: CHROMOGRNA  No results found for: PSA1  No visits with results within 3 Day(s) from this visit.  Latest known visit with results is:  Admission on 10/16/2019, Discharged on 10/17/2019  Component Date Value Ref Range Status  . Preg Test, Ur 10/16/2019 NEGATIVE  NEGATIVE Final   Comment:        THE SENSITIVITY OF THIS METHODOLOGY IS >24 mIU/mL   . SURGICAL PATHOLOGY 10/16/2019    Final-Edited                   Value:SURGICAL PATHOLOGY CASE: [id number] PATIENT: [name] Surgical Pathology Report     Clinical History: right breast cancer (cm)     FINAL MICROSCOPIC DIAGNOSIS:  A. BREAST, RIGHT, MASTECTOMY: - Invasive ductal carcinoma, grade 3, 3.4 cm, with central cavitation - Ductal carcinoma in situ, high-grade - Resection margins are negative for carcinoma; closest is the deep margin at 1.1 cm - Negative for lymphovascular perineural invasion - Metastatic carcinoma to three of six lymph nodes (3/6) with evidence of extranodal extension - Biopsy site changes - See oncology table  B. LYMPH  NODES, RIGHT AXILLARY, RESECTION: - Metastatic carcinoma to two of four lymph nodes (2/4)  C. SKIN, RIGHT SUPERIOR MASTECTOMY FLAP, EXCISION: - Skin and subcutaneous tissue, negative for carcinoma    ONCOLOGY TABLE:  INVASIVE CARCINOMA OF THE BREAST:  STATUS POST NEOADJUVANT TREATMENT: Resection  Procedure: Modified radical mastectomy Specimen Laterality: Right                          Tumor Size: 3.4 cm Histologic Type: Invasive ductal carcinoma Histologic Grade:      Glandular (Acinar)/Tubular Differentiation: 3      Nuclear Pleomorphism: 3      Mitotic Rate: 2      Overall Grade: 3 Ductal Carcinoma in Situ: Present, high-grade Tumor Extension: Limited to breast parenchyma Margins: Uninvolved by invasive carcinoma      Distance from closest margin (millimeters): 11 mm DCIS Margins (required only if DCIS is present in specimen): Uninvolved by DCIS      Distance from closest margin (millimeters): 11 mm Regional Lymph Nodes:      Number of Lymph Nodes Examined: 10      Number of Sentinel Nodes Examined (if applicable): 0      Number of Lymph Nodes with Macrometastases (>2 mm): 4      Number of Lymph Nodes with Micrometastases: 1      Number of Lymph Nodes with Isolated Tumor Cells (=0.2 mm or =200 cells): 0      Size of Largest Metastatic Deposit (millimeters): 9 mm      Extranodal Extension: Present Treatment Effect: Probable or definite respon                         se to presurgical therapy in the invasive carcinoma Breast Prognostic Profile (pre-neoadjuvant case #: [id number])      Estrogen Receptor: 0%, negative      Progesterone Receptor: 0%, negative      Her2: Negative (0)  Ki-67: 90%      Will be repeated on the current case (Block #: A3) and the results reported separately. Residual Cancer Burden (RCB):      Primary Tumor Bed: 34 mm x 31 mm      Overall Cancer Cellularity: 25 %      Percentage of Cancer that is in Situ: 5%      Number of Positive  Lymph Nodes: 6      Diameter of Largest Lymph Node metastasis: 9 mm      Residual Cancer Burden: 3.76      Residual Cancer Burden Class: RCB-III Representative Tumor Block: A3 Pathologic Stage Classification (pTNM, AJCC 8th Edition): ypT2, ypN2a  (v4.4.0.0)      GROSS DESCRIPTION:  Specimen A: Right mastectomy with axillary contents, suture at lateral, in formalin at 0700 hours Specimen integrity (intact/disrupted): Intact Weight: 1254 g Size: 21 x 19 x 8 cm                          Skin: 19 x 11 cm tan-brown skin ellipse with a 6.5 cm in diameter off-center unremarkable nipple and surrounding areola.  There are numerous tan-pink soft papules across the skin surface which are up to 0.4 cm in diameter and 0.15 cm thick. Tumor/cavity: Within the central specimen is a 3.4 x 3.1 x 2.8 cm area of gray-white indurated, ill-defined tissue with a central 1.2 cm cavity.  Within the cavity wall is a ribbon clip.  This area of tissue with ribbon clip is 1.1 cm from the nearest deep margin.  Muscle is not identified on the deep margin.  Found within the superior lateral specimen is a coil clip with surrounding pink-white, soft and otherwise unremarkable tissue.  A discrete lesion or mass is not identified grossly with this clip.  The clip is 3 cm from the deep margin.  No additional clips or mass lesions are found within the specimen on Faxitron scan. Uninvolved parenchyma: Predominantly soft fatty tissue with scattered gray-white to hyperemic soft fibrous-like                          tissue. Prognostic indicators: Not taken at gross Lymph nodes: The portion of axillary contents is 4 x 3 x 1.7cm.  There are 6 possible firm lymph nodes which range from 0.4 to 1.6 cm. Block summary: Blocks 1-5 = tissue containing ribbon clip Blocks 6, 7 = deep margin nearest tissue with ribbon clip Blocks 8-10 = tissue around coil clip Block 11 = deep margin nearest coil clip Block 12 = inferior  lateral Block 13 = superior medial Block 14 = inferior medial Blocks 15, 16 = sections of skin including papules Block 17 = 2 nodes, larger bisected Block 18 = 2 nodes, each bisected (1 inked black) Block 19 = 1 node bisected Block 20 = largest node, bisected  Specimen B: Received fresh is a 3.7 x 3.2 x 1.1 cm portion of fatty tissue within which are found 5 possible nodes which range from 0.2 to 1 cm and are entirely submitted as follows: Block 1 = 2 nodes Block 2 = 2 nodes, each bisected (1 inked black) Block 3 = 1 node bisected  Specimen C: Received fresh is a 22.4                          x 3 cm ellipse of  tan-brown unremarkable skin with underlying soft fatty tissue up to 2.2 cm deep. There is a long suture at lateral pole and short suture at superior edge.  The inferior edge and deep margin are inked black, and cut surfaces are unremarkable. Representative sections along the inferior margin are submitted in 1 block.  SSW 10/18/2019   Final Diagnosis performed by Jaquita Folds, MD.   Electronically signed 10/19/2019 Technical and / or Professional components performed at [facility]. [facility], [place] [address], [place], [place] [address].  Immunohistochemistry Technical component (if applicable) was performed at [facility]. [address] [place], [place], [place] [address].   IMMUNOHISTOCHEMISTRY DISCLAIMER (if applicable): Some of these immunohistochemical stains may have been developed and the performance characteristics determine by [facility]. Some may not have been cleared or approved by the [place]                         d and Insurance risk surveyor. The FDA has determined that such clearance or approval is not necessary. This test is used for clinical purposes. It should not be regarded as investigational or for research. This laboratory is certified under the [place] (CLIA-88)  as qualified to perform high complexity clinical laboratory testing.  The controls stained appropriately.     (this displays the last labs from the last 3 days)  No results found for: TOTALPROTELP, ALBUMINELP, A1GS, A2GS, BETS, BETA2SER, GAMS, MSPIKE, SPEI (this displays SPEP labs)  No results found for: KPAFRELGTCHN, LAMBDASER, KAPLAMBRATIO (kappa/lambda light chains)  Lab Results  Component Value Date   HGBA 97.4 12/16/2016   (Hemoglobinopathy evaluation)   No results found for: LDH  No results found for: IRON, TIBC, IRONPCTSAT (Iron and TIBC)  Lab Results  Component Value Date   FERRITIN 900 (H) 09/12/2019    Urinalysis    Component Value Date/Time   COLORURINE YELLOW 06/26/2019 1300   APPEARANCEUR HAZY (A) 06/26/2019 1300   LABSPEC 1.019 06/26/2019 1300   PHURINE 6.0 06/26/2019 1300   GLUCOSEU NEGATIVE 06/26/2019 1300   HGBUR NEGATIVE 06/26/2019 1300   BILIRUBINUR NEGATIVE 06/26/2019 1300   KETONESUR NEGATIVE 06/26/2019 1300   PROTEINUR NEGATIVE 06/26/2019 1300   UROBILINOGEN 4.0 (H) 01/13/2018 1417   NITRITE NEGATIVE 06/26/2019 1300   LEUKOCYTESUR LARGE (A) 06/26/2019 1300     STUDIES:  No results found.   ELIGIBLE FOR AVAILABLE RESEARCH PROTOCOL: no   ASSESSMENT: 33 y.o. [place] woman status post right breast biopsy x2 and right axillary lymph node biopsy 04/19/2019 for a clinical T4 N2 MX, stage IIIC invasive ductal carcinoma, grade 3, triple negative, with an MIB-1 of 90%  (a) chest CT scan 04/11/2019 shows multiple subcentimeter lung nodules  (b) PET scan denied by insurance  (c) CT A/P and bone scan due 05/09/2019 show a very small low-density lesion in the lateral segment of the left liver which is indeterminate, and sclerosis of the right SI joint, which is felt to be likely degenerative; bone scan was entirely negative.  (c) baseline breast MRI 04/30/2019 shows involvement of all 4 quadrants in the right breast as well as right axillary  subpectoral and internal mammary lymphadenopathy.  Left breast was unremarkable  (1) neoadjuvant chemotherapy consisting of  (a) pembrolizumab started 05/03/2019, third dose delayed because of elevated liver function tests, resumed 08/02/2019  (b) paclitaxel weekly x12 with carboplatin every 21 days x 4, started 05/08/2019   (i) tolerated  carboplatin AUC 5-day 1 poorly, switched to AUC 2 on day 1 day 8   (ii) cycle 2 delayed 1 week because of elevated liver function tests   (iii) carboplatin and paclitaxel discontinued after 07/03/2019 because of neuropathy, (received 7 weekly doses paclitaxel and one AUC5 carbo dose, four AUC2 doses)  (c) cyclophosphamide and doxorubicin x4 started 07/03/2019, completed 08/29/2019   (i) 2d dose given day 28 due to poor tolerance; other doses Q 21 days   (2) definitive surgery pending  (3) adjuvant radiation to follow, with capecitabine sensitization  (4) genetics testing  (a) genetic testing 05/14/2019 through the Common Hereditary Gene Panel offered by [facility] found no deleterious mutations in APC, ATM, AXIN2, BARD1, BMPR1A, BRCA1, BRCA2, BRIP1, CDH1, CDK4, CDKN2A (p14ARF), CDKN2A (p16INK4a), CHEK2, CTNNA1, DICER1, EPCAM (Deletion/duplication testing only), GREM1 (promoter region deletion/duplication testing only), KIT, MEN1, MLH1, MSH2, MSH3, MSH6, MUTYH, NBN, NF1, NHTL1, PALB2, PDGFRA, PMS2, POLD1, POLE, PTEN, RAD50, RAD51C, RAD51D, RNF43, SDHB, SDHC, SDHD, SMAD4, SMARCA4. STK11, TP53, TSC1, TSC2, and VHL.  The following genes were evaluated for sequence changes only: SDHA and HOXB13 c.251G>A variant only  (5) goserelin started 05/08/2019  (6) pembrolizumab to be continued and minimum of 1 year (through May 2021)  (a) changed to every 4 weeks after October dose to coincide with goserelin doses   PLAN: Glenna Fellows, MD  10/30/19 5:53 PM Medical Oncology and Hematology [facility] [place], [place]  [address] Tel. [phone]    Fax. [phone]   I, Wilburn Mylar, am acting as scribe for Dr. [name]. .  I, Lurline Del MD, have reviewed the above documentation for accuracy and completeness, and I agree with the above.

## 2019-10-30 ENCOUNTER — Inpatient Hospital Stay: Payer: Medicaid Other | Attending: Oncology

## 2019-10-30 ENCOUNTER — Inpatient Hospital Stay: Payer: Medicaid Other

## 2019-10-30 ENCOUNTER — Other Ambulatory Visit: Payer: Self-pay

## 2019-10-30 ENCOUNTER — Emergency Department (HOSPITAL_COMMUNITY): Payer: Medicaid Other

## 2019-10-30 ENCOUNTER — Encounter (HOSPITAL_COMMUNITY): Payer: Self-pay

## 2019-10-30 ENCOUNTER — Emergency Department (HOSPITAL_COMMUNITY)
Admission: EM | Admit: 2019-10-30 | Discharge: 2019-10-31 | Disposition: A | Discharge status: Home / Self Care | Payer: Medicaid Other | Attending: Emergency Medicine | Admitting: Emergency Medicine

## 2019-10-30 ENCOUNTER — Inpatient Hospital Stay (HOSPITAL_BASED_OUTPATIENT_CLINIC_OR_DEPARTMENT_OTHER): Payer: Medicaid Other | Admitting: Oncology

## 2019-10-30 DIAGNOSIS — Z79899 Other long term (current) drug therapy: Secondary | ICD-10-CM | POA: Diagnosis not present

## 2019-10-30 DIAGNOSIS — F17210 Nicotine dependence, cigarettes, uncomplicated: Secondary | ICD-10-CM | POA: Insufficient documentation

## 2019-10-30 DIAGNOSIS — Z5111 Encounter for antineoplastic chemotherapy: Secondary | ICD-10-CM | POA: Insufficient documentation

## 2019-10-30 DIAGNOSIS — Z171 Estrogen receptor negative status [ER-]: Secondary | ICD-10-CM

## 2019-10-30 DIAGNOSIS — L989 Disorder of the skin and subcutaneous tissue, unspecified: Secondary | ICD-10-CM | POA: Insufficient documentation

## 2019-10-30 DIAGNOSIS — I10 Essential (primary) hypertension: Secondary | ICD-10-CM | POA: Diagnosis not present

## 2019-10-30 DIAGNOSIS — F1210 Cannabis abuse, uncomplicated: Secondary | ICD-10-CM | POA: Insufficient documentation

## 2019-10-30 DIAGNOSIS — G8918 Other acute postprocedural pain: Secondary | ICD-10-CM | POA: Insufficient documentation

## 2019-10-30 DIAGNOSIS — Z9011 Acquired absence of right breast and nipple: Secondary | ICD-10-CM | POA: Insufficient documentation

## 2019-10-30 DIAGNOSIS — G629 Polyneuropathy, unspecified: Secondary | ICD-10-CM | POA: Insufficient documentation

## 2019-10-30 DIAGNOSIS — Z841 Family history of disorders of kidney and ureter: Secondary | ICD-10-CM | POA: Insufficient documentation

## 2019-10-30 DIAGNOSIS — C50811 Malignant neoplasm of overlapping sites of right female breast: Secondary | ICD-10-CM

## 2019-10-30 DIAGNOSIS — Z808 Family history of malignant neoplasm of other organs or systems: Secondary | ICD-10-CM | POA: Insufficient documentation

## 2019-10-30 DIAGNOSIS — Z853 Personal history of malignant neoplasm of breast: Secondary | ICD-10-CM | POA: Diagnosis not present

## 2019-10-30 DIAGNOSIS — Z820 Family history of epilepsy and other diseases of the nervous system: Secondary | ICD-10-CM | POA: Insufficient documentation

## 2019-10-30 DIAGNOSIS — N644 Mastodynia: Secondary | ICD-10-CM | POA: Insufficient documentation

## 2019-10-30 DIAGNOSIS — L409 Psoriasis, unspecified: Secondary | ICD-10-CM | POA: Insufficient documentation

## 2019-10-30 DIAGNOSIS — Z6832 Body mass index (BMI) 32.0-32.9, adult: Secondary | ICD-10-CM | POA: Insufficient documentation

## 2019-10-30 DIAGNOSIS — Z800 Family history of malignant neoplasm of digestive organs: Secondary | ICD-10-CM | POA: Insufficient documentation

## 2019-10-30 DIAGNOSIS — Z833 Family history of diabetes mellitus: Secondary | ICD-10-CM | POA: Insufficient documentation

## 2019-10-30 DIAGNOSIS — Z803 Family history of malignant neoplasm of breast: Secondary | ICD-10-CM | POA: Insufficient documentation

## 2019-10-30 LAB — COMPREHENSIVE METABOLIC PANEL
ALT: 20 U/L (ref 0–44)
AST: 29 U/L (ref 15–41)
Albumin: 3.6 g/dL (ref 3.5–5.0)
Alkaline Phosphatase: 78 U/L (ref 38–126)
Anion gap: 10 (ref 5–15)
BUN: 5 mg/dL — ABNORMAL LOW (ref 6–20)
CO2: 26 mmol/L (ref 22–32)
Calcium: 9 mg/dL (ref 8.9–10.3)
Chloride: 98 mmol/L (ref 98–111)
Creatinine, Ser: 0.84 mg/dL (ref 0.44–1.00)
GFR calc Af Amer: >60 mL/min (ref >60)
GFR calc non Af Amer: >60 mL/min (ref >60)
Glucose, Bld: 111 mg/dL — ABNORMAL HIGH (ref 70–99)
Potassium: 3 mmol/L — ABNORMAL LOW (ref 3.5–5.1)
Sodium: 134 mmol/L — ABNORMAL LOW (ref 135–145)
Total Bilirubin: 0.5 mg/dL (ref 0.3–1.2)
Total Protein: 7 g/dL (ref 6.5–8.1)

## 2019-10-30 LAB — CBC WITH DIFFERENTIAL/PLATELET
Abs Immature Granulocytes: 0.02 10*3/uL (ref 0.00–0.07)
Basophils Absolute: 0 10*3/uL (ref 0.0–0.1)
Basophils Relative: 0 %
Eosinophils Absolute: 0.1 10*3/uL (ref 0.0–0.5)
Eosinophils Relative: 2 %
HCT: 32 % — ABNORMAL LOW (ref 36.0–46.0)
Hemoglobin: 10.5 g/dL — ABNORMAL LOW (ref 12.0–15.0)
Immature Granulocytes: 0 %
Lymphocytes Relative: 11 %
Lymphs Abs: 0.7 10*3/uL (ref 0.7–4.0)
MCH: 31.8 pg (ref 26.0–34.0)
MCHC: 32.8 g/dL (ref 30.0–36.0)
MCV: 97 fL (ref 80.0–100.0)
Monocytes Absolute: 0.4 10*3/uL (ref 0.1–1.0)
Monocytes Relative: 7 %
Neutro Abs: 4.7 10*3/uL (ref 1.7–7.7)
Neutrophils Relative %: 80 %
Platelets: 301 10*3/uL (ref 150–400)
RBC: 3.3 MIL/uL — ABNORMAL LOW (ref 3.87–5.11)
RDW: 14.3 % (ref 11.5–15.5)
WBC: 6 10*3/uL (ref 4.0–10.5)
nRBC: 0 % (ref 0.0–0.2)

## 2019-10-30 LAB — I-STAT BETA HCG BLOOD, ED (MC, WL, AP ONLY): I-stat hCG, quantitative: 5.1 m[IU]/mL — ABNORMAL HIGH (ref <5)

## 2019-10-30 LAB — LACTIC ACID, PLASMA: Lactic Acid, Venous: 1.5 mmol/L (ref 0.5–1.9)

## 2019-10-30 MED ORDER — OXYCODONE-ACETAMINOPHEN 5-325 MG PO TABS
1.0000 | ORAL_TABLET | Freq: Once | ORAL | Status: AC
  Administered 2019-10-30: 1 via ORAL
  Filled 2019-10-30: qty 1

## 2019-10-30 NOTE — ED Triage Notes (Signed)
Pt presents w/Right breast pain x6 days and swelling x2 days. Pt presents w/2 JP drains from her Right breast. Right breast mastectomy 10/16/2019. Pt reports the pain started when her 33 year old was kicking her feet during a diaper change and her foot got tangled up in the drains pulling them. Did not pull the drain out but reports it feels dislodged. Pt also reports some SOB. Oncologist advised pt to come to ED to rule out infection

## 2019-10-31 ENCOUNTER — Ambulatory Visit: Payer: Medicaid Other | Attending: Oncology | Admitting: Physical Therapy

## 2019-10-31 MED ORDER — MORPHINE SULFATE (PF) 4 MG/ML IV SOLN
4.0000 mg | Freq: Once | INTRAVENOUS | Status: DC
  Filled 2019-10-31: qty 1

## 2019-10-31 MED ORDER — OXYCODONE-ACETAMINOPHEN 5-325 MG PO TABS
2.0000 | ORAL_TABLET | Freq: Once | ORAL | Status: AC
  Administered 2019-10-31: 2 via ORAL
  Filled 2019-10-31: qty 2

## 2019-10-31 MED ORDER — ONDANSETRON HCL 4 MG/2ML IJ SOLN
4.0000 mg | Freq: Once | INTRAMUSCULAR | Status: DC
  Filled 2019-10-31: qty 2

## 2019-10-31 MED ORDER — OXYCODONE-ACETAMINOPHEN 5-325 MG PO TABS: 1.0000 | ORAL_TABLET | Freq: Four times a day (QID) | ORAL | 0 refills | Status: DC | PRN

## 2019-10-31 NOTE — ED Provider Notes (Signed)
[facility] EMERGENCY DEPARTMENT Provider Note   CSN: [id number] Arrival date & time: 10/30/19  2046     History   Chief Complaint Chief Complaint  Patient presents with  . Breast Pain    HPI Terri Estes is a 33 y.o. female.     HPI  This is a 33 year old female with history of psoriasis currently undergoing chemotherapy for metastatic breast cancer who presents with right-sided pain at her surgical site.  Patient had a mastectomy on October 20 of this year by Dr. Lucia Gaskins.  She had been doing well at home.  She states that 1 day she was changing her child's diaper when her drain got pulled.  She retaped it.  Since that time over the last 5 to 6 days she has noted increased right-sided pain.  She has also noted that her drains have diminished in output.  She has not noted any purulent output.  She reports that drain #1 has transition from bloody output to slightly cloudy output and drain #2 now has yellowish output.  She has not noted any fevers.  No redness or erythema at the site.  She does report pain on the right chest wall and towards the back which she associates with worsening swelling.  Also reports pain at the insertion sites of the drains.  She called her oncologist today and they advised her to come to the ED to rule out infection.  Currently she rates her pain 8 out of 10.  She did take Percocet which does sometimes help.  Past Medical History:  Diagnosis Date  . Anemia    after pregnancy  . Anxiety    severe not taking meds  . Asthma    inhaler  4x per day  . Chronic pain   . Depression   . Family history of breast cancer   . GERD (gastroesophageal reflux disease)   . History of substance abuse ([place])   . Pregnancy induced hypertension    takes medication now.  . Psoriasis    all over body  . Trichomonas infection   . Vaginal Pap smear, abnormal     Patient Active Problem List   Diagnosis Date Noted  . Breast cancer, stage 2, right  ([place]) 10/16/2019  . Encounter for antineoplastic immunotherapy 08/22/2019  . Neuropathy due to chemotherapeutic drug ([place]) 08/15/2019  . Genetic testing 05/14/2019  . Family history of breast cancer   . Malignant neoplasm of overlapping sites of right breast in female, estrogen receptor negative ([place]) 04/25/2019  . Acute kidney injury ([place]) 09/29/2018  . 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 Acute blood loss anemia 09/23/2018  . DIC (disseminated intravascular coagulation) ([place]) 09/23/2018  . S/P cesarean section 09/23/2018  . Inadequate social support 07/21/2018  . Sciatica 07/21/2018  . Abdominal muscle strain, initial encounter 07/21/2018  . History of substance abuse ([name]) 06/12/2018  . Supervision of other normal pregnancy, antepartum 05/10/2018  . Short interval between pregnancies affecting pregnancy, antepartum 05/10/2018  . Hx of preeclampsia, prior pregnancy, currently pregnant 05/10/2018  . History of preterm delivery, currently pregnant 05/10/2018  . Depression affecting pregnancy, antepartum 05/10/2018  . HSIL (high grade squamous intraepithelial lesion) on Pap smear of cervix 12/24/2016  . History of cesarean delivery 12/16/2016    Past Surgical History:  Procedure Laterality Date  . CESAREAN SECTION  05/26/2008  . CESAREAN SECTION N/A 07/10/2017   Procedure: CESAREAN SECTION;  Surgeon: Florian Buff, MD;  Location: [facility] BIRTHING  SU[facility];  Service: Obstetrics;  Laterality: N/A;  . CESAREAN SECTION N/A 09/23/2018   Procedure: CESAREAN SECTION;  Surgeon: Donnamae Jude, MD;  Location: [place];  Service: Obstetrics;  Laterality: N/A;  . COLPOSCOPY    . MASTECTOMY WITH AXILLARY LYMPH NODE DISSECTION Right 10/16/2019   Procedure: RIGHT MASTECTOMY WITH AXILLARY LYMPH NODE DISSECTION;  Surgeon: Alphonsa Overall, MD;  Location: [place];  Service: General;  Laterality: Right;  . PORTACATH PLACEMENT Left 04/30/2019   Procedure: INSERTION PORT-A-CATH WITH  ULTRASOUND;  Surgeon: Alphonsa Overall, MD;  Location: [facility];  Service: General;  Laterality: Left;  Left Subclavian vein  . WISDOM TOOTH EXTRACTION       OB History    Gravida  6   Para  3   Term  1   Preterm  2   AB  3   Living  3     SAB      TAB  3   Ectopic      Multiple  0   Live Births  3            Home Medications    Prior to Admission medications   Medication Sig Start Date End Date Taking? Authorizing Provider  acyclovir ointment (ZOVIRAX) 5 % Apply 1 application topically 2 (two) times daily as needed. Mix 1 part acyclovir cream with 1 part over the counter hydrocortisone 1% cream and apply to affected area two times a day as needed 06/27/19  Yes Causey, Charlestine Massed, NP  albuterol (PROVENTIL HFA;VENTOLIN HFA) 108 (90 Base) MCG/ACT inhaler Inhale 2 puffs into the lungs 4 (four) times daily. 09/26/18  Yes Donnamae Jude, MD  buprenorphine (SUBUTEX) 8 MG SUBL SL tablet Place 8 mg under the tongue 2 (two) times daily.    Yes [provider]  cholestyramine (QUESTRAN) 4 g packet Take 1 packet (4 g total) by mouth 3 (three) times daily with meals. 05/11/19  Yes Magrinat, [name], MD  cyclobenzaprine (FLEXERIL) 10 MG tablet Take 1 tablet (10 mg total) by mouth 3 (three) times daily as needed for muscle spasms. 09/26/18  Yes Donnamae Jude, MD  enalapril (VASOTEC) 5 MG tablet Take 1 tablet (5 mg total) by mouth daily. 09/12/19  Yes Magrinat, [name], MD  gabapentin (NEURONTIN) 300 MG capsule TAKE 1 CAPSULE(300 MG) BY MOUTH THREE TIMES DAILY Patient taking differently: Take 300 mg by mouth 3 (three) times daily.  09/10/19  Yes Causey, Charlestine Massed, NP  HYDROcodone-acetaminophen (NORCO/VICODIN) 5-325 MG tablet Take 1 tablet by mouth every 6 (six) hours as needed for moderate pain. 10/17/19  Yes Alphonsa Overall, MD  hydrocortisone (ANUSOL-HC) 2.5 % rectal cream Place 1 application rectally 2 (two) times daily. 07/18/19  Yes Tanner, Lyndon Code., PA-C  ketoconazole  (NIZORAL) 2 % cream Apply 1 application topically daily. 08/15/19  Yes Magrinat, [name], MD  omeprazole (PRILOSEC) 40 MG capsule Take 1 capsule (40 mg total) by mouth daily. 05/11/19  Yes Tanner, Lyndon Code., PA-C  ondansetron (ZOFRAN) 8 MG tablet TAKE 1 TABLET(8 MG) BY MOUTH EVERY 8 HOURS AS NEEDED FOR NAUSEA OR VOMITING Patient taking differently: Take 8 mg by mouth every 8 (eight) hours as needed for nausea or vomiting.  09/12/19  Yes Magrinat, [name], MD  PARoxetine (PAXIL) 10 MG tablet Take 1 tablet (10 mg total) by mouth daily. Patient taking differently: Take 20 mg by mouth daily.  05/11/19  Yes Harle Stanford., PA-C  Prenatal MV-Min-FA-Omega-3 (PRENATAL GUMMIES/DHA &  FA) 0.4-32.5 MG CHEW Chew 2 each by mouth daily.   Yes [provider]  valACYclovir (VALTREX) 1000 MG tablet Take 1 tablet (1,000 mg total) by mouth 2 (two) times daily. 06/26/19  Yes Causey, Charlestine Massed, NP  enalapril (VASOTEC) 10 MG tablet TAKE 1 TABLET BY MOUTH DAILY Patient taking differently: Take 10 mg by mouth daily.  03/15/19   Donnamae Jude, MD    Family History Family History  Problem Relation Age of Onset  . Kidney disease Maternal Grandmother   . Breast cancer Maternal Grandmother 28       metastatic to liver; d. 38  . Kidney disease Maternal Grandfather   . Kidney disease Paternal Grandmother   . Diabetes Paternal Grandmother   . Breast cancer Paternal Grandmother   . Kidney disease Paternal Grandfather   . Breast cancer Paternal Aunt   . Cerebral palsy Child   . Breast cancer Paternal Great-grandmother   . Pancreatic cancer Other        PGMs brother   . Throat cancer Paternal Uncle   . Breast cancer Other        PGMs sister    Social History Social History   Tobacco Use  . Smoking status: Current Every Day Smoker    Packs/day: 0.10    Years: 0.00    Pack years: 0.00    Types: Cigarettes  . Smokeless tobacco: Never Used  . Tobacco comment: trying to quit - smokes 2 cigarettes/day   Substance Use Topics  . Alcohol use: No  . Drug use: Yes    Frequency: 7.0 times per week    Types: Marijuana    Comment: subutex last dose 10/15/19, last dose Sun 10/14/19     Allergies   Patient has no known allergies.   Review of Systems Review of Systems  Constitutional: Negative for fever.  Respiratory: Negative for shortness of breath.   Cardiovascular: Negative for chest pain.       Chest wall pain  Gastrointestinal: Negative for abdominal pain.  Genitourinary: Negative for dysuria.  Musculoskeletal: Negative for back pain.  All other systems reviewed and are negative.    Physical Exam Updated Vital Signs BP 109/73   Pulse 87   Temp 98.8 F (37.1 C) (Oral)   Resp 19   LMP  (LMP Unknown)   SpO2 100%   Physical Exam Vitals signs and nursing note reviewed.  Constitutional:      Appearance: She is well-developed. She is not ill-appearing.  HENT:     Head: Normocephalic and atraumatic.     Nose: Nose normal.     Mouth/Throat:     Mouth: Mucous membranes are moist.  Eyes:     Pupils: Pupils are equal, round, and reactive to light.  Cardiovascular:     Rate and Rhythm: Normal rate and regular rhythm.     Heart sounds: Normal heart sounds.  Pulmonary:     Effort: Pulmonary effort is normal. No respiratory distress.     Breath sounds: No wheezing.  Chest:       Comments: Status post back cystectomy on the right, large surgical incision from axilla to mid chest clean dry and intact, no fluctuance or surrounding erythema, 2 drain sites inferior to the wound with small amount of serosanguineous drainage, there is some drainage at the insertion of bilateral drains but this is not purulent, no fluctuance or erythema associated with this Abdominal:     Palpations: Abdomen is soft.  Musculoskeletal:  Right lower leg: No edema.     Left lower leg: No edema.  Skin:    General: Skin is warm and dry.     Comments: Diffuse patchy scaling rash involving bilateral  upper and lower extremities, chest and back  Neurological:     Mental Status: She is alert and oriented to person, place, and time.  Psychiatric:        Mood and Affect: Mood normal.          ED Treatments / Results  Labs (all labs ordered are listed, but only abnormal results are displayed) Labs Reviewed  CBC WITH DIFFERENTIAL/PLATELET - Abnormal; Notable for the following components:      Result Value   RBC 3.30 (*)    Hemoglobin 10.5 (*)    HCT 32.0 (*)    All other components within normal limits  COMPREHENSIVE METABOLIC PANEL - Abnormal; Notable for the following components:   Sodium 134 (*)    Potassium 3.0 (*)    Glucose, Bld 111 (*)    BUN 5 (*)    All other components within normal limits  I-STAT BETA HCG BLOOD, ED ([facility], [facility], [facility] ONLY) - Abnormal; Notable for the following components:   I-stat hCG, quantitative 5.1 (*)    All other components within normal limits  LACTIC ACID, PLASMA    EKG None  Radiology Dg Chest 2 View  Result Date: 10/30/2019 CLINICAL DATA:  Right breast pain and swelling for 6 days. Drainage catheters in the right breast. History of right breast mastectomy 10/16/2019. EXAM: CHEST - 2 VIEW COMPARISON:  04/30/2019 FINDINGS: The left subclavian power port is in good position. No complicating features. The heart is normal in size. The mediastinal and hilar contours are stable. The lungs are clear.  No pleural effusion. Two right-sided drainage catheters are noted in the right breast area. IMPRESSION: No acute cardiopulmonary findings or worrisome pulmonary lesions. Port-A-Cath in good position without complicating features. Right sided chest wall drainage catheters are noted. Electronically Signed   By: Marijo Sanes M.D.   On: 10/30/2019 21:42    Procedures Procedures (including critical care time)  Medications Ordered in ED Medications  oxyCODONE-acetaminophen (PERCOCET/ROXICET) 5-325 MG per tablet 1 tablet (1 tablet Oral Given 10/30/19 2145)   oxyCODONE-acetaminophen (PERCOCET/ROXICET) 5-325 MG per tablet 2 tablet (2 tablets Oral Given 10/31/19 0255)     Initial Impression / Assessment and Plan / ED Course  I have reviewed the triage vital signs and the nursing notes.  Pertinent labs & imaging results that were available during my care of the patient were reviewed by me and considered in my medical decision making (see chart for details).  Clinical Course as of Oct 30 256  Wed Oct 31, 2019  0245 Reviewed the case with on-call surgeon, Dr. Rosendo Gros.  No indication at this time for infection.  I do not see any fluctuance and drains have minimal serosanguineous to slightly cloudy fluid.  He does not recommend any additional imaging or antibiotics at this time and recommends she follows up with Dr. Lucia Gaskins as scheduled later today.  I discussed this with the patient who is agreeable to plan.  She is requesting oral pain medication.   [CH]    Clinical Course User Index [CH] Keilly Fatula, Barbette Hair, MD       Presents with postoperative pain.  Reports she had her drain slightly pulled on several days ago and since has had increasing pain and swelling.  There is no evidence  of obvious infection on exam.  She is tender about the drainage site but this would be normal.  She has serosanguineous drainage from one drain and slightly cloudy drainage from the other but no overt pus.  No evidence of fluctuance or abscess.  Swelling may be related to diminished lymphatic drainage.  I did discuss with surgery on-call.  Patient has an appointment later today with general surgery.  Given reassuring work-up, will defer antibiotics and have her follow-up with her general surgeon.  Plan to treat pain at this time.  Patient is agreeable to plan.  After history, exam, and medical workup I feel the patient has been appropriately medically screened and is safe for discharge home. Pertinent diagnoses were discussed with the patient. Patient was given return  precautions.   Final Clinical Impressions(s) / ED Diagnoses   Final diagnoses:  Post-op pain    ED Discharge Orders    None       Jalaysia Lobb, Barbette Hair, MD 10/31/19 [phone]

## 2019-10-31 NOTE — Discharge Instructions (Addendum)
Follow-up with Dr. Lucia Gaskins today as scheduled.

## 2019-10-31 NOTE — ED Notes (Signed)
Discharge instructions discussed with pt and family at bedside. Pt verbalized understanding of pain management and follow up care. No questions at this time. Pt to go home with family

## 2019-10-31 NOTE — ED Notes (Signed)
4 mg morphine discontinued d/t no IV access and discharge home. Medication changed to PO.   Wasted 4 mg of morphine with Darl Householder. RN.

## 2019-11-05 ENCOUNTER — Telehealth: Payer: Self-pay | Admitting: Oncology

## 2019-11-05 NOTE — Telephone Encounter (Signed)
Scheduled appt per 11/9 staff message from [place] (11/3 los) unable to reach pt . Left message with appt date and time . And let RN know I was unable to reach pt .

## 2019-11-08 NOTE — Progress Notes (Signed)
[place]  [phone] [phone]    ID: Terri Estes DOB: 1991/10/01  MR#: [id number]  YQI#:[id number]  Patient Care Team: Patient, No Pcp Per as PCP - General (General Practice) Alphonsa Overall, MD as Consulting Physician (General Surgery) Yolander Goodie, [name], MD as Consulting Physician (Oncology) Kyung Rudd, MD as Consulting Physician (Radiation Oncology) Rockwell Germany, RN as Oncology Nurse Navigator Mauro Kaufmann, RN as Oncology Nurse Navigator Donnamae Jude, MD as Consulting Physician (Obstetrics and Gynecology) Pavelock, Ralene Bathe, MD as Consulting Physician (Internal Medicine) OTHER MD: Joylene Igo for [facility] is [phone]   CHIEF COMPLAINT: 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    She had her latest pembrolizumab dose 10/02/2019.  She was due for a repeat dose 10/30/2019 and also goserelin on the same day, but rescheduled to today.    Since that dose she has had a significant flare of her psoriasis which is imaged below.  She is perfectly miserable, she is very itchy and very uncomfortable.  She has lesions everywhere including her vaginal area and she tells me she has not had any sex in several weeks although 10 days ago the beta-hCG was just above 5 at 5.1.  We are repeating that today, but since she has been on goserelin I would anticipate this to be negative.  Since her last visit, she underwent right mastectomy with axillary lymph node dissection on 10/16/2019 under Dr. Lucia Gaskins. Pathology from the procedure ([id number]) showed: invasive ductal carcinoma, grade 3, 3.4 cm, with central cavitation; ductal carcinoma in situ, high grade; negative resection margins; negative for lymphovascular perineural invasion.   A total of 10 lymph nodes were biopsied at that time. Of these, a  total of 5 were positive for metastatic carcinoma, with 3 of these showing extranodal extension.  She presented to the ED on 10/30/2019 with persistent right breast pain and swelling. Chest x-ray performed at that time showed no acute cardiopulmonary findings or worrisome pulmonary lesions.  We are following her TSH:  Lab Results  Component Value Date   TSH 0.241 (L) 10/02/2019   TSH 0.235 (L) 09/12/2019   TSH 1.889 05/08/2019   TSH 0.012 (L) 12/16/2016    REVIEW OF SYSTEMS: [name] has skin lesions "all over".  That includes her scalp, vaginal areas, legs, trunk, and arms.  They are very itchy and uncomfortable.  She is depressed and feels "I am not myself".  Socially she is living in [place], and as far as the home situation goes, difficult as it is, it appears to be stable.  Hot flashes and other menopausal symptoms are not the main issue at this point.  For her skin she is taking oatmeal baths and trying to stay cold since heat makes things worse.  A detailed review of systems was otherwise stable.   HISTORY OF CURRENT ILLNESS: From the original intake note:  [name] presented with swelling along the left side of the face with neck pain. She then underwent a neck CT on 04/11/2019 showing: Enlarged left level 2 lymph nodes with associated inflammatory change including stranding of the adjacent fat and thickening of the platysma. While malignancy is not excluded, this is most concerning for infection. No focal abscess is present. Hypoattenuation within 1 of the left level 2 lymph nodes likely represents central necrosis or suppuration of the node. No primary malignancy or  other abscess. Left upper lobe pulmonary nodules may be inflammatory. The largest measures 0.7 x 0.7 x 0.6 cm. CT of the chest with contrast may be useful for further evaluation.  She also underwent a chest CT on the same day showing: Complex right breast mass measuring 5.6 x 4.5 x 4.7 cm consistent with a primary  breast carcinoma. Malignant right axillary adenopathy measuring up to 3.1 cm. Multiple bilateral pulmonary nodules are concerning for metastatic disease. Given the right breast mass, the left cervical lymph nodes are more likely malignant.  She then underwent bilateral diagnostic mammography with tomography and right breast ultrasonography at [facility] on 04/19/2019 showing: Breast Density Category C; findings which are highly suspicious for multicentric inflammatory right breast cancer with right axillary nodal metastatic disease; no mammographic evidence of malignancy involving the left breast.  Accordingly on 04/19/2019 she proceeded to biopsy of the right breast area in question. The pathology from this procedure showed ([id number]): invasive ductal carcinoma, grade III, upper inner quadrant, 12:30 o'clock, 5.0 cm from the nipple. Prognostic indicators significant for: estrogen receptor, 0% negative and progesterone receptor, 0% negative. Proliferation marker Ki67 at 90%. HER2 negative (0+).  Additional biopsies of the right breast and right axilla were performed on the same day. The pathology from this procedure showed ([id number]): 2. Breast, right, needle core biopsy, satellite mass UOQ, 10 o'clock, 8cmfn  - Invasive ductal carcinoma, grade III 3. Lymph node, needle/core biopsy, level 1 right axilla   - Invasive ductal carcinoma, grade III  The patient's subsequent history is as detailed below.   PAST MEDICAL HISTORY: Past Medical History:  Diagnosis Date   Anemia    after pregnancy   Anxiety    severe not taking meds   Asthma    inhaler  4x per day   Chronic pain    Depression    Family history of breast cancer    GERD (gastroesophageal reflux disease)    History of substance abuse ([place])    Pregnancy induced hypertension    takes medication now.   Psoriasis    all over body   Trichomonas infection    Vaginal Pap smear, abnormal     PAST SURGICAL  HISTORY: Past Surgical History:  Procedure Laterality Date   CESAREAN SECTION  05/26/2008   CESAREAN SECTION N/A 07/10/2017   Procedure: CESAREAN SECTION;  Surgeon: Florian Buff, MD;  Location: [facility];  Service: Obstetrics;  Laterality: N/A;   CESAREAN SECTION N/A 09/23/2018   Procedure: CESAREAN SECTION;  Surgeon: Donnamae Jude, MD;  Location: [facility];  Service: Obstetrics;  Laterality: N/A;   COLPOSCOPY     MASTECTOMY WITH AXILLARY LYMPH NODE DISSECTION Right 10/16/2019   Procedure: RIGHT MASTECTOMY WITH AXILLARY LYMPH NODE DISSECTION;  Surgeon: Alphonsa Overall, MD;  Location: [facility];  Service: General;  Laterality: Right;   PORTACATH PLACEMENT Left 04/30/2019   Procedure: INSERTION PORT-A-CATH WITH ULTRASOUND;  Surgeon: Alphonsa Overall, MD;  Location: [facility];  Service: General;  Laterality: Left;  Left Subclavian vein   WISDOM TOOTH EXTRACTION      FAMILY HISTORY: Family History  Problem Relation Age of Onset   Kidney disease Maternal Grandmother    Breast cancer Maternal Grandmother 28       metastatic to liver; d. 32   Kidney disease Maternal Grandfather    Kidney disease Paternal Grandmother    Diabetes Paternal Grandmother    Breast cancer Paternal Grandmother    Kidney disease Paternal Grandfather  Breast cancer Paternal Aunt    Cerebral palsy Child    Breast cancer Paternal Great-grandmother    Pancreatic cancer Other        PGMs brother    Throat cancer Paternal Uncle    Breast cancer Other        PGMs sister  (Updated 04/25/2019) Citlali's father is living at age 71. Patients' mother is also living at age 61. Marland Kitchen[place] notes, however, that she was raised by her grandmother.) The patient has 0 brothers and 5 sisters. Patient denies anyone in her family having ovarian, prostate, or pancreatic cancer. Her maternal grandmother was diagnosed with breast cancer, unsure what age. On her father's side, she reports her grandmother had cysts  in her breasts, her great-aunt might have had breast cancer, and her great-grandmother was diagnosed with breast cancer at an older age.   GYNECOLOGIC HISTORY:  No LMP recorded (lmp unknown). (Menstrual status: Chemotherapy).  She had an emergent C-section in 09/23/2018 for abruptio placenta at 30 weeks pregnancy, also has a history of eclampsia Menarche:    years old Age at first live birth: 33 years old [place] P: 3 LMP: 04/18/2019 Contraceptive: On goserelin Hysterectomy?: no BSO?: no   SOCIAL HISTORY: (Current as of August 2020) [name] is currently unemployed. She previously worked at [employer]. She is currently engaged. Fiance [name] runs a Mid[employer] and works other odd jobs.  The patient recently moved to [place].  Daughter Demetrius Charity, age 84, was abused by her father at 55 months and has cerebral palsy. Son [name], age 64, has severe asthma and is developmentally disabled. Daughter Yetta Glassman was born on 09/23/2018 prematurely and remains on oxygen at home.  The patient's aunt has been staying with her and is helping with the children and providing general support   ADVANCED DIRECTIVES: not in place.    HEALTH MAINTENANCE: Social History   Tobacco Use   Smoking status: Current Every Day Smoker    Packs/day: 0.10    Years: 0.00    Pack years: 0.00    Types: Cigarettes   Smokeless tobacco: Never Used   Tobacco comment: trying to quit - smokes 2 cigarettes/day  Substance Use Topics   Alcohol use: No   Drug use: Yes    Frequency: 7.0 times per week    Types: Marijuana    Comment: subutex last dose 10/15/19, last dose Sun 10/14/19    Colonoscopy: n/a  PAP: 05/10/2018  Bone density: never done Mammography: first performed following abnormal CT  No Known Allergies   Current Outpatient Medications  Medication Sig Dispense Refill   acyclovir ointment (ZOVIRAX) 5 % Apply 1 application topically 2 (two) times daily as needed. Mix 1 part acyclovir cream with 1  part over the counter hydrocortisone 1% cream and apply to affected area two times a day as needed 5 g 0   albuterol (PROVENTIL HFA;VENTOLIN HFA) 108 (90 Base) MCG/ACT inhaler Inhale 2 puffs into the lungs 4 (four) times daily. 1 Inhaler 2   buprenorphine (SUBUTEX) 8 MG SUBL SL tablet Place 8 mg under the tongue 2 (two) times daily.      cholestyramine (QUESTRAN) 4 g packet Take 1 packet (4 g total) by mouth 3 (three) times daily with meals. 60 each 12   cyclobenzaprine (FLEXERIL) 10 MG tablet Take 1 tablet (10 mg total) by mouth 3 (three) times daily as needed for muscle spasms. 90 tablet 2   enalapril (VASOTEC) 5 MG tablet Take 1 tablet (5 mg total)  by mouth daily. 90 tablet 4   gabapentin (NEURONTIN) 300 MG capsule TAKE 1 CAPSULE(300 MG) BY MOUTH THREE TIMES DAILY (Patient taking differently: Take 300 mg by mouth 3 (three) times daily. ) 90 capsule 0   HYDROcodone-acetaminophen (NORCO/VICODIN) 5-325 MG tablet Take 1 tablet by mouth every 6 (six) hours as needed for moderate pain. 15 tablet 0   hydrocortisone (ANUSOL-HC) 2.5 % rectal cream Place 1 application rectally 2 (two) times daily. 60 g 0   ketoconazole (NIZORAL) 2 % cream Apply 1 application topically daily. 15 g 0   omeprazole (PRILOSEC) 40 MG capsule Take 1 capsule (40 mg total) by mouth daily. 30 capsule 5   ondansetron (ZOFRAN) 8 MG tablet TAKE 1 TABLET(8 MG) BY MOUTH EVERY 8 HOURS AS NEEDED FOR NAUSEA OR VOMITING (Patient taking differently: Take 8 mg by mouth every 8 (eight) hours as needed for nausea or vomiting. ) 20 tablet 0   oxyCODONE-acetaminophen (PERCOCET/ROXICET) 5-325 MG tablet Take 1-2 tablets by mouth every 6 (six) hours as needed for severe pain. 10 tablet 0   PARoxetine (PAXIL) 10 MG tablet Take 1 tablet (10 mg total) by mouth daily. (Patient taking differently: Take 20 mg by mouth daily. ) 30 tablet 5   Prenatal MV-Min-FA-Omega-3 (PRENATAL GUMMIES/DHA & FA) 0.4-32.5 MG CHEW Chew 2 each by mouth daily.      valACYclovir (VALTREX) 1000 MG tablet Take 1 tablet (1,000 mg total) by mouth 2 (two) times daily. 60 tablet 0   No current facility-administered medications for this visit.    Facility-Administered Medications Ordered in Other Visits  Medication Dose Route Frequency Provider Last Rate Last Dose   0.9 % NaCl with KCl 20 mEq/ L  infusion   Intravenous Once Laney Louderback, [name], MD       sodium chloride flush (NS) 0.9 % injection 10 mL  10 mL Intracatheter PRN Vernestine Brodhead, [name], MD        OBJECTIVE: Young African-American woman who was tearful during today's visit  Vitals:   11/09/19 1444  BP: (!) 121/101  Pulse: (!) 113  Resp: 18  Temp: 98.9 F (37.2 C)  SpO2: 100%   Wt Readings from Last 3 Encounters:  11/09/19 175 lb 3.2 oz (79.5 kg)  10/16/19 191 lb 4.8 oz (86.8 kg)  10/12/19 191 lb 4.8 oz (86.8 kg)   Body mass index is 32.04 kg/m.    ECOG FS:1 - Symptomatic but completely ambulatory  Sclerae unicteric, EOMs intact Wearing a mask No cervical or supraclavicular adenopathy Lungs no rales or rhonchi Heart regular rate and rhythm Abd soft, nontender, positive bowel sounds MSK no focal spinal tenderness, no upper extremity lymphedema Neuro: nonfocal, well oriented, appropriate affect Breasts: The right breast is status post mastectomy.  The chest and legs are imaged below.  Both axillae are benign.       LAB RESULTS:  CMP     Component Value Date/Time   NA 141 11/09/2019 1429   NA 137 05/10/2018 1535   K 3.0 (LL) 11/09/2019 1429   CL 100 11/09/2019 1429   CO2 28 11/09/2019 1429   GLUCOSE 88 11/09/2019 1429   BUN 12 11/09/2019 1429   BUN 7 05/10/2018 1535   CREATININE 0.98 11/09/2019 1429   CREATININE 0.81 07/03/2019 0919   CALCIUM 9.7 11/09/2019 1429   PROT 8.0 11/09/2019 1429   PROT 7.2 05/10/2018 1535   ALBUMIN 4.3 11/09/2019 1429   ALBUMIN 4.2 05/10/2018 1535   AST 17 11/09/2019 1429  AST 54 (H) 07/03/2019 0919   ALT 10 11/09/2019 1429   ALT 102  (H) 07/03/2019 0919   ALKPHOS 75 11/09/2019 1429   BILITOT 0.3 11/09/2019 1429   BILITOT <0.2 (L) 07/03/2019 0919   GFRNONAA >60 11/09/2019 1429   GFRNONAA >60 07/03/2019 0919   GFRAA >60 11/09/2019 1429   GFRAA >60 07/03/2019 0919    No results found for: TOTALPROTELP, ALBUMINELP, A1GS, A2GS, BETS, BETA2SER, GAMS, MSPIKE, SPEI  No results found for: KPAFRELGTCHN, LAMBDASER, KAPLAMBRATIO  Lab Results  Component Value Date   WBC 8.8 11/09/2019   NEUTROABS 7.1 11/09/2019   HGB 11.2 (L) 11/09/2019   HCT 34.7 (L) 11/09/2019   MCV 96.4 11/09/2019   PLT 328 11/09/2019    '@LASTCHEMISTRY'$ @  No results found for: LABCA2  No components found for: [id number]  No results for input(s): INR in the last 168 hours.  No results found for: LABCA2  No results found for: ION629  No results found for: BMW413  No results found for: KGM010  No results found for: CA2729  No components found for: HGQUANT  No results found for: CEA1 / No results found for: CEA1   No results found for: AFPTUMOR  No results found for: CHROMOGRNA  No results found for: PSA1  Appointment on 11/09/2019  Component Date Value Ref Range Status   WBC 11/09/2019 8.8  4.0 - 10.5 K/uL Final   RBC 11/09/2019 3.60* 3.87 - 5.11 MIL/uL Final   Hemoglobin 11/09/2019 11.2* 12.0 - 15.0 g/dL Final   HCT 11/09/2019 34.7* 36.0 - 46.0 % Final   MCV 11/09/2019 96.4  80.0 - 100.0 fL Final   MCH 11/09/2019 31.1  26.0 - 34.0 pg Final   MCHC 11/09/2019 32.3  30.0 - 36.0 g/dL Final   RDW 11/09/2019 14.5  11.5 - 15.5 % Final   Platelets 11/09/2019 328  150 - 400 K/uL Final   nRBC 11/09/2019 0.0  0.0 - 0.2 % Final   Neutrophils Relative % 11/09/2019 82  % Final   Neutro Abs 11/09/2019 7.1  1.7 - 7.7 K/uL Final   Lymphocytes Relative 11/09/2019 11  % Final   Lymphs Abs 11/09/2019 1.0  0.7 - 4.0 K/uL Final   Monocytes Relative 11/09/2019 5  % Final   Monocytes Absolute 11/09/2019 0.5  0.1 - 1.0 K/uL Final    Eosinophils Relative 11/09/2019 2  % Final   Eosinophils Absolute 11/09/2019 0.2  0.0 - 0.5 K/uL Final   Basophils Relative 11/09/2019 0  % Final   Basophils Absolute 11/09/2019 0.0  0.0 - 0.1 K/uL Final   Immature Granulocytes 11/09/2019 0  % Final   Abs Immature Granulocytes 11/09/2019 0.01  0.00 - 0.07 K/uL Final   Performed at [facility], [place] [address] [place], [place] [address]   Sodium 11/09/2019 141  135 - 145 mmol/L Final   Potassium 11/09/2019 3.0* 3.5 - 5.1 mmol/L Final   CRITICAL RESULT CALLED TO, READ BACK BY AND VERIFIED WITH: Hinda Lenis, RN at [phone] by Prudencio Burly    Chloride 11/09/2019 100  98 - 111 mmol/L Final   CO2 11/09/2019 28  22 - 32 mmol/L Final   Glucose, Bld 11/09/2019 88  70 - 99 mg/dL Final   BUN 11/09/2019 12  6 - 20 mg/dL Final   Creatinine, Ser 11/09/2019 0.98  0.44 - 1.00 mg/dL Final   Calcium 11/09/2019 9.7  8.9 - 10.3 mg/dL Final   Total Protein 11/09/2019 8.0  6.5 -  8.1 g/dL Final   Albumin 11/09/2019 4.3  3.5 - 5.0 g/dL Final   AST 11/09/2019 17  15 - 41 U/L Final   ALT 11/09/2019 10  0 - 44 U/L Final   Alkaline Phosphatase 11/09/2019 75  38 - 126 U/L Final   Total Bilirubin 11/09/2019 0.3  0.3 - 1.2 mg/dL Final   GFR calc non Af Amer 11/09/2019 >60  >60 mL/min Final   GFR calc Af Amer 11/09/2019 >60  >60 mL/min Final   Anion gap 11/09/2019 13  5 - 15 Final   Performed at [facility], [address] [place], [place] [address]    (this displays the last labs from the last 3 days)  No results found for: TOTALPROTELP, ALBUMINELP, A1GS, A2GS, BETS, BETA2SER, GAMS, MSPIKE, SPEI (this displays SPEP labs)  No results found for: KPAFRELGTCHN, LAMBDASER, KAPLAMBRATIO (kappa/lambda light chains)  Lab Results  Component Value Date   HGBA 97.4 12/16/2016   (Hemoglobinopathy evaluation)   No results found for: LDH  No results found for: IRON, TIBC, IRONPCTSAT (Iron and  TIBC)  Lab Results  Component Value Date   FERRITIN 900 (H) 09/12/2019    Urinalysis    Component Value Date/Time   COLORURINE YELLOW 06/26/2019 1300   APPEARANCEUR HAZY (A) 06/26/2019 1300   LABSPEC 1.019 06/26/2019 1300   PHURINE 6.0 06/26/2019 1300   GLUCOSEU NEGATIVE 06/26/2019 1300   HGBUR NEGATIVE 06/26/2019 1300   BILIRUBINUR NEGATIVE 06/26/2019 1300   KETONESUR NEGATIVE 06/26/2019 1300   PROTEINUR NEGATIVE 06/26/2019 1300   UROBILINOGEN 4.0 (H) 01/13/2018 1417   NITRITE NEGATIVE 06/26/2019 1300   LEUKOCYTESUR LARGE (A) 06/26/2019 1300     STUDIES:  Dg Chest 2 View  Result Date: 10/30/2019 CLINICAL DATA:  Right breast pain and swelling for 6 days. Drainage catheters in the right breast. History of right breast mastectomy 10/16/2019. EXAM: CHEST - 2 VIEW COMPARISON:  04/30/2019 FINDINGS: The left subclavian power port is in good position. No complicating features. The heart is normal in size. The mediastinal and hilar contours are stable. The lungs are clear.  No pleural effusion. Two right-sided drainage catheters are noted in the right breast area. IMPRESSION: No acute cardiopulmonary findings or worrisome pulmonary lesions. Port-A-Cath in good position without complicating features. Right sided chest wall drainage catheters are noted. Electronically Signed   By: Marijo Sanes M.D.   On: 10/30/2019 21:42     ELIGIBLE FOR AVAILABLE RESEARCH PROTOCOL: no   ASSESSMENT: 33 y.o. [place] woman status post right breast biopsy x2 and right axillary lymph node biopsy 04/19/2019 for a clinical T4 N2 MX, stage IIIC invasive ductal carcinoma, grade 3, triple negative, with an MIB-1 of 90%  (a) chest CT scan 04/11/2019 shows multiple subcentimeter lung nodules  (b) PET scan denied by insurance  (c) CT A/P and bone scan due 05/09/2019 show a very small low-density lesion in the lateral segment of the left liver which is indeterminate, and sclerosis of the right SI joint, which is  felt to be likely degenerative; bone scan was entirely negative.  (c) baseline breast MRI 04/30/2019 shows involvement of all 4 quadrants in the right breast as well as right axillary subpectoral and internal mammary lymphadenopathy.  Left breast was unremarkable  (1) neoadjuvant chemotherapy consisting of  (a) pembrolizumab started 05/03/2019, third dose delayed because of elevated liver function tests, resumed 08/02/2019  (b) paclitaxel weekly x12 with carboplatin every 21 days x 4, started 05/08/2019   (i)  tolerated carboplatin AUC 5-day 1 poorly, switched to AUC 2 on day 1 day 8   (ii) cycle 2 delayed 1 week because of elevated liver function tests   (iii) carboplatin and paclitaxel discontinued after 07/03/2019 because of neuropathy, (received 7 weekly doses paclitaxel and one AUC5 carbo dose, four AUC2 doses)  (c) cyclophosphamide and doxorubicin x4 started 07/03/2019, completed 08/29/2019   (i) 2d dose given day 28 due to poor tolerance; other doses Q 21 days   (2) s/p right mastectomy and ALND 10/16/2019 showing a residual ypT2 ypN2 invasive ductal carcinoma, grade 3, with negative margins  (a) a total of 10 lymph nodes were removed, 4 with macrometastases, 1 with a micrometastasis  (3) adjuvant radiation to follow, consider capecitabine sensitization  (4) genetics testing  (a) genetic testing 05/14/2019 through the Common Hereditary Gene Panel offered by [facility] found no deleterious mutations in APC, ATM, AXIN2, BARD1, BMPR1A, BRCA1, BRCA2, BRIP1, CDH1, CDK4, CDKN2A (p14ARF), CDKN2A (p16INK4a), CHEK2, CTNNA1, DICER1, EPCAM (Deletion/duplication testing only), GREM1 (promoter region deletion/duplication testing only), KIT, MEN1, MLH1, MSH2, MSH3, MSH6, MUTYH, NBN, NF1, NHTL1, PALB2, PDGFRA, PMS2, POLD1, POLE, PTEN, RAD50, RAD51C, RAD51D, RNF43, SDHB, SDHC, SDHD, SMAD4, SMARCA4. STK11, TP53, TSC1, TSC2, and VHL.  The following genes were evaluated for sequence changes only: SDHA and HOXB13  c.251G>A variant only  (5) goserelin started 05/08/2019  (6) pembrolizumab started 08/02/2019, stopped after fourth dose (10/02/2019) with psoriasis flare     PLAN: [name] has a severe psoriasis flare, which is imaged above.  Partly this was present prior to the chemo she received but it has flared when the chemo was stopped.  I do believe the pembrolizumab also may have made it worse and therefore we are stopping that medication.  She needs acute treatment.  I am starting her on a prednisone taper, and also on methotrexate 15 mg weekly.  She will have her first methotrexate dose tomorrow.  She will start folic acid today and take it daily.  All these instructions were given to her in writing.  We are obtaining a pregnancy test today before starting the above treatments.  I expect it to be negative.  We will try to set her up for a dermatologic evaluation now that she has Medicaid.  [name] will return to see me in 1 week.  We will check her labs and monitor initial response at that time.  She was also seen by SW today-- please see her note  I spent approximately 30 minutes face to face with [name] with more than 50% of that time spent in counseling and coordination of care.  Lyah Millirons, [name], MD  11/09/19 3:21 PM Medical Oncology and Hematology [facility] [place], [place] [address] Tel. [phone]    Fax. [phone]   I, Wilburn Mylar, am acting as scribe for Dr. [name]. Shaquelle Hernon.  I, Lurline Del MD, have reviewed the above documentation for accuracy and completeness, and I agree with the above.

## 2019-11-09 ENCOUNTER — Other Ambulatory Visit: Payer: Self-pay | Admitting: *Deleted

## 2019-11-09 ENCOUNTER — Encounter: Payer: Self-pay | Admitting: General Practice

## 2019-11-09 ENCOUNTER — Inpatient Hospital Stay: Payer: Medicaid Other

## 2019-11-09 ENCOUNTER — Other Ambulatory Visit: Payer: Self-pay

## 2019-11-09 ENCOUNTER — Inpatient Hospital Stay (HOSPITAL_BASED_OUTPATIENT_CLINIC_OR_DEPARTMENT_OTHER): Payer: Medicaid Other | Admitting: Oncology

## 2019-11-09 ENCOUNTER — Ambulatory Visit: Payer: Medicaid Other

## 2019-11-09 VITALS — BP 121/101 | HR 113 | Temp 98.9°F | Resp 18 | Ht 62.0 in | Wt 175.2 lb

## 2019-11-09 DIAGNOSIS — L409 Psoriasis, unspecified: Secondary | ICD-10-CM | POA: Diagnosis not present

## 2019-11-09 DIAGNOSIS — F17210 Nicotine dependence, cigarettes, uncomplicated: Secondary | ICD-10-CM | POA: Diagnosis not present

## 2019-11-09 DIAGNOSIS — L408 Other psoriasis: Secondary | ICD-10-CM | POA: Diagnosis not present

## 2019-11-09 DIAGNOSIS — Z841 Family history of disorders of kidney and ureter: Secondary | ICD-10-CM | POA: Diagnosis not present

## 2019-11-09 DIAGNOSIS — Z820 Family history of epilepsy and other diseases of the nervous system: Secondary | ICD-10-CM | POA: Diagnosis not present

## 2019-11-09 DIAGNOSIS — L989 Disorder of the skin and subcutaneous tissue, unspecified: Secondary | ICD-10-CM | POA: Diagnosis not present

## 2019-11-09 DIAGNOSIS — Z808 Family history of malignant neoplasm of other organs or systems: Secondary | ICD-10-CM | POA: Diagnosis not present

## 2019-11-09 DIAGNOSIS — Z171 Estrogen receptor negative status [ER-]: Secondary | ICD-10-CM

## 2019-11-09 DIAGNOSIS — Z6832 Body mass index (BMI) 32.0-32.9, adult: Secondary | ICD-10-CM | POA: Diagnosis not present

## 2019-11-09 DIAGNOSIS — C50811 Malignant neoplasm of overlapping sites of right female breast: Secondary | ICD-10-CM

## 2019-11-09 DIAGNOSIS — Z79899 Other long term (current) drug therapy: Secondary | ICD-10-CM | POA: Diagnosis not present

## 2019-11-09 DIAGNOSIS — Z5111 Encounter for antineoplastic chemotherapy: Secondary | ICD-10-CM | POA: Diagnosis not present

## 2019-11-09 DIAGNOSIS — G629 Polyneuropathy, unspecified: Secondary | ICD-10-CM | POA: Diagnosis not present

## 2019-11-09 DIAGNOSIS — Z9011 Acquired absence of right breast and nipple: Secondary | ICD-10-CM | POA: Diagnosis not present

## 2019-11-09 DIAGNOSIS — Z833 Family history of diabetes mellitus: Secondary | ICD-10-CM | POA: Diagnosis not present

## 2019-11-09 DIAGNOSIS — Z800 Family history of malignant neoplasm of digestive organs: Secondary | ICD-10-CM | POA: Diagnosis not present

## 2019-11-09 DIAGNOSIS — Z803 Family history of malignant neoplasm of breast: Secondary | ICD-10-CM | POA: Diagnosis not present

## 2019-11-09 LAB — CBC WITH DIFFERENTIAL/PLATELET
Abs Immature Granulocytes: 0.01 10*3/uL (ref 0.00–0.07)
Basophils Absolute: 0 10*3/uL (ref 0.0–0.1)
Basophils Relative: 0 %
Eosinophils Absolute: 0.2 10*3/uL (ref 0.0–0.5)
Eosinophils Relative: 2 %
HCT: 34.7 % — ABNORMAL LOW (ref 36.0–46.0)
Hemoglobin: 11.2 g/dL — ABNORMAL LOW (ref 12.0–15.0)
Immature Granulocytes: 0 %
Lymphocytes Relative: 11 %
Lymphs Abs: 1 10*3/uL (ref 0.7–4.0)
MCH: 31.1 pg (ref 26.0–34.0)
MCHC: 32.3 g/dL (ref 30.0–36.0)
MCV: 96.4 fL (ref 80.0–100.0)
Monocytes Absolute: 0.5 10*3/uL (ref 0.1–1.0)
Monocytes Relative: 5 %
Neutro Abs: 7.1 10*3/uL (ref 1.7–7.7)
Neutrophils Relative %: 82 %
Platelets: 328 10*3/uL (ref 150–400)
RBC: 3.6 MIL/uL — ABNORMAL LOW (ref 3.87–5.11)
RDW: 14.5 % (ref 11.5–15.5)
WBC: 8.8 10*3/uL (ref 4.0–10.5)
nRBC: 0 % (ref 0.0–0.2)

## 2019-11-09 LAB — COMPREHENSIVE METABOLIC PANEL
ALT: 10 U/L (ref 0–44)
AST: 17 U/L (ref 15–41)
Albumin: 4.3 g/dL (ref 3.5–5.0)
Alkaline Phosphatase: 75 U/L (ref 38–126)
Anion gap: 13 (ref 5–15)
BUN: 12 mg/dL (ref 6–20)
CO2: 28 mmol/L (ref 22–32)
Calcium: 9.7 mg/dL (ref 8.9–10.3)
Chloride: 100 mmol/L (ref 98–111)
Creatinine, Ser: 0.98 mg/dL (ref 0.44–1.00)
GFR calc Af Amer: >60 mL/min (ref >60)
GFR calc non Af Amer: >60 mL/min (ref >60)
Glucose, Bld: 88 mg/dL (ref 70–99)
Potassium: 3 mmol/L — CL (ref 3.5–5.1)
Sodium: 141 mmol/L (ref 135–145)
Total Bilirubin: 0.3 mg/dL (ref 0.3–1.2)
Total Protein: 8 g/dL (ref 6.5–8.1)

## 2019-11-09 LAB — PREGNANCY, URINE: Preg Test, Ur: NEGATIVE

## 2019-11-09 MED ORDER — FOLIC ACID 1 MG PO TABS: 1.0000 mg | ORAL_TABLET | Freq: Every day | ORAL | 3 refills | Status: DC

## 2019-11-09 MED ORDER — GOSERELIN ACETATE 3.6 MG ~~LOC~~ IMPL
3.6000 mg | DRUG_IMPLANT | Freq: Once | SUBCUTANEOUS | Status: AC
  Administered 2019-11-09: 3.6 mg via SUBCUTANEOUS

## 2019-11-09 MED ORDER — METHOTREXATE SODIUM 7.5 MG PO TABS: 15.0000 mg | ORAL_TABLET | ORAL | 0 refills | Status: DC

## 2019-11-09 MED ORDER — GOSERELIN ACETATE 3.6 MG ~~LOC~~ IMPL
DRUG_IMPLANT | SUBCUTANEOUS | Status: AC
  Filled 2019-11-09: qty 3.6

## 2019-11-09 MED ORDER — PREDNISONE 5 MG PO TABS: ORAL_TABLET | ORAL | 0 refills | Status: DC

## 2019-11-09 MED FILL — METHOTREXATE SODIUM 2.5 MG: 2.5 | 14 days supply | Qty: 12 | Fill #0

## 2019-11-09 MED FILL — predniSONE 5 MG (21) TBPK: 5 | 6 days supply | Qty: 21 | Fill #0

## 2019-11-09 MED FILL — FOLIC ACID 1 MG TABS: 1 | 60 days supply | Qty: 60 | Fill #0

## 2019-11-09 NOTE — Progress Notes (Signed)
Terri Estes  Met w patient in exam room. She is tearful, overwhelmed.  Has been staying w godmother and not w fiance and children for last several days as she feels she is unable "to be of much help to them."  Recovering from surgery, has rash all over her body, very uncomfortable. HAs not connected with mental health providers - will need new referral as she now lives in [place].  She is pleased w her new living arrangement - has larger house at a reasonable rent.  Remains on wait list for Section 8 vouchers which will also help w affordable housing when issued.  Provided 3rd distribution from [employer] to assist w gas.  Will talk w patient next week to explore referral options in her new location.  Terri Shell, LCSW Clinical Social Worker Phone:  [phone] Cell:  [phone]

## 2019-11-09 NOTE — Patient Instructions (Signed)

## 2019-11-11 ENCOUNTER — Other Ambulatory Visit: Payer: Self-pay | Admitting: Oncology

## 2019-11-12 ENCOUNTER — Telehealth: Payer: Self-pay | Admitting: Oncology

## 2019-11-12 LAB — TSH: TSH: 0.71 u[IU]/mL (ref 0.308–3.960)

## 2019-11-12 NOTE — Telephone Encounter (Signed)
I could not reach patient regarding 11/20

## 2019-11-13 ENCOUNTER — Ambulatory Visit
Admission: RE | Admit: 2019-11-13 | Discharge: 2019-11-13 | Disposition: A | Discharge status: Home / Self Care | Payer: Medicaid Other | Source: Ambulatory Visit | Attending: Radiation Oncology | Admitting: Radiation Oncology

## 2019-11-13 DIAGNOSIS — C50811 Malignant neoplasm of overlapping sites of right female breast: Secondary | ICD-10-CM

## 2019-11-13 NOTE — Progress Notes (Signed)
Location of Breast Cancer:Right Breast  Histology per Pathology Report: SURGICAL PATHOLOGY  CASE: [id number]  PATIENT: Terri Estes  Surgical Pathology Report  Clinical History: right breast cancer (cm)  FINAL MICROSCOPIC DIAGNOSIS:   A. 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 LYMPH NODES, RIGHT AXILLARY, RESECTION:  - Metastatic carcinoma to two of four lymph nodes (2/4)   C. SKIN, RIGHT SUPERIOR MASTECTOMY FLAP, EXCISION:  - Skin and subcutaneous tissue, negative for carcinoma     ONCOLOGY TABLE:   INVASIVE CARCINOMA OF THE BREAST: STATUS POST NEOADJUVANT TREATMENT:  Resection   Procedure: Modified radical mastectomy  Specimen Laterality: Right  Tumor Size: 3.4 cm  Histologic Type: Invasive ductal carcinoma  Histologic Grade:    Glandular (Acinar)/Tubular Differentiation: 3    Nuclear Pleomorphism: 3    Mitotic Rate: 2    Overall Grade: 3  Ductal Carcinoma in Situ: Present, high-grade  Tumor Extension: Limited to breast parenchyma  Margins: Uninvolved by invasive carcinoma    Distance from closest margin (millimeters): 11 mm  DCIS Margins (required only if DCIS is present in specimen): Uninvolved  by DCIS    Distance from closest margin (millimeters): 11 mm  Regional Lymph Nodes:    Number of Lymph Nodes Examined: 10    Number of Sentinel Nodes Examined (if applicable): 0    Number of Lymph Nodes with Macrometastases (>2 mm): 4    Number of Lymph Nodes with Micrometastases: 1    Number of Lymph Nodes with Isolated Tumor Cells (=0.2 mm or =200   cells): 0    Size of Largest Metastatic Deposit (millimeters): 9 mm    Extranodal Extension: Present  Treatment Effect: Probable or definite response to presurgical therapy   Receptor Status: ER(0%), PR (0%), Her2-neu (neg), Ki-(90%)  Did patient present with symptoms (if so, please note symptoms) or was this found on screening mammography?:Terri Estes presented with swelling along the left side of the face with neck pain. She then underwent a neck CT on 04/11/2019 showing: Enlarged left level 2 lymph nodes with associated inflammatory change including stranding of the adjacent fat and thickening of the platysma. While malignancy is not excluded, this is most concerning for infection. No focal abscess is present. Hypoattenuation within 1 of the left level 2 lymph nodes likely represents central necrosis or suppuration of the node. No primary malignancy or other abscess. Left upper lobe pulmonary nodules may be inflammatory. The largest measures 0.7 x 0.7 x 0.6 cm. CT of the chest with contrast may be useful for further evaluation.  She also underwent a chest CT on the same day showing: Complex right breast mass measuring 5.6 x 4.5 x 4.7 cm consistent with a primary breast carcinoma. Malignant right axillary adenopathy measuring up to 3.1 cm. Multiple bilateral pulmonary nodules are concerning for metastatic disease. Given the right breast mass, the left cervical lymph nodes are more likely malignant.  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  Accordingly on 04/19/2019 she proceeded to biopsy of the right breast area in question. The pathology from this procedure  showed ([id number]): invasive ductal carcinoma, grade III, upper inner quadrant, 12:30 o'clock, 5.0 cm from the nipple. Prognostic indicators significant for: estrogen receptor, 0% negative and progesterone receptor, 0% negative. Proliferation marker Ki67 at 90%. HER2 negative (0+).   Past/Anticipated interventions by surgeon, if any: Right Masectomy  Past/Anticipated interventions by medical oncology, if any: Chemotherapy She had her latest pembrolizumab dose 10/02/2019.  She was due for a repeat dose 10/30/2019 and also goserelin on the same day, but rescheduled   Lymphedema issues, if any:  slight  Pain issues, if any: itching  SAFETY ISSUES:  Prior radiation? no  Pacemaker/ICD? no  Possible current pregnancy?no  Is the patient on methotrexate? yes  Current Complaints / other details:  Patient with severe itching from reaction to chemotherapy has been placed on methotrexate.    Ross Marcus, RN 11/13/2019,8:22 AM

## 2019-11-14 ENCOUNTER — Ambulatory Visit: Payer: Medicaid Other

## 2019-11-14 ENCOUNTER — Telehealth: Payer: Self-pay | Admitting: Radiation Oncology

## 2019-11-14 ENCOUNTER — Ambulatory Visit: Payer: Medicaid Other | Admitting: Radiation Oncology

## 2019-11-14 ENCOUNTER — Ambulatory Visit
Admission: RE | Admit: 2019-11-14 | Discharge: 2019-11-14 | Disposition: A | Discharge status: Home / Self Care | Payer: Medicaid Other | Source: Ambulatory Visit | Attending: Radiation Oncology | Admitting: Radiation Oncology

## 2019-11-14 DIAGNOSIS — Z171 Estrogen receptor negative status [ER-]: Secondary | ICD-10-CM | POA: Insufficient documentation

## 2019-11-14 DIAGNOSIS — C50811 Malignant neoplasm of overlapping sites of right female breast: Secondary | ICD-10-CM | POA: Insufficient documentation

## 2019-11-14 NOTE — Telephone Encounter (Signed)
Phoned patient as requested by Shona Simpson, PA-C. Inquired if she would prefer a referral to [name] since that is closer to [name], where she says she lives now. She verbalized that she doesn't want a referral because she has just started chemo here with Dr. Jana Hakim. Offered to reschedule her appointment with Dr. Lisbeth Renshaw. She request an appointment on Friday around 1100 because she "already has to come up here to get her chemo pills." Patient understands this RN will verbalize her request to the providers and the scheduling department will phone her back.

## 2019-11-14 NOTE — Telephone Encounter (Signed)
Opened in error

## 2019-11-14 NOTE — Telephone Encounter (Signed)
Phoned patient to confirm today's appointments. No answer on cell. Left detailed message. Phoned significant other, [name]. He confirms the patient is aware of her appointments and he understands that she plans to be present for them. He does report the patient isn't feeling well. [name] expressed appreciation for the call.

## 2019-11-14 NOTE — Telephone Encounter (Signed)
I spoke with the patient to see if she felt like she'd be able to get to [facility] for her treatments. I let her know they would be M-F treatments for 6 1/2 weeks. She is currently having issues with transportation and lives over an hour away. She was also offered the option to have a rad onc referral closer to home in [place]. She declines and wants to keep her care in [place]. We will try to reschedule her tomorrow to come to the office for her consultation. We will also ask our transportation team to see if they can assist in any way as she is considering living with family members in [place] during treatment.

## 2019-11-15 ENCOUNTER — Other Ambulatory Visit: Payer: Self-pay

## 2019-11-15 ENCOUNTER — Ambulatory Visit
Admission: RE | Admit: 2019-11-15 | Discharge: 2019-11-15 | Disposition: A | Discharge status: Home / Self Care | Payer: Medicaid Other | Source: Ambulatory Visit | Attending: Radiation Oncology | Admitting: Radiation Oncology

## 2019-11-15 ENCOUNTER — Encounter: Payer: Self-pay | Admitting: Radiation Oncology

## 2019-11-15 ENCOUNTER — Other Ambulatory Visit: Payer: Self-pay | Admitting: Radiation Oncology

## 2019-11-15 VITALS — BP 100/76 | HR 75 | Temp 98.2°F | Resp 18 | Ht 62.0 in | Wt 175.8 lb

## 2019-11-15 DIAGNOSIS — F418 Other specified anxiety disorders: Secondary | ICD-10-CM | POA: Diagnosis not present

## 2019-11-15 DIAGNOSIS — C50811 Malignant neoplasm of overlapping sites of right female breast: Secondary | ICD-10-CM

## 2019-11-15 DIAGNOSIS — Z803 Family history of malignant neoplasm of breast: Secondary | ICD-10-CM | POA: Insufficient documentation

## 2019-11-15 DIAGNOSIS — Z3009 Encounter for other general counseling and advice on contraception: Secondary | ICD-10-CM

## 2019-11-15 DIAGNOSIS — L409 Psoriasis, unspecified: Secondary | ICD-10-CM

## 2019-11-15 DIAGNOSIS — Z171 Estrogen receptor negative status [ER-]: Secondary | ICD-10-CM | POA: Diagnosis not present

## 2019-11-15 DIAGNOSIS — R234 Changes in skin texture: Secondary | ICD-10-CM | POA: Insufficient documentation

## 2019-11-15 DIAGNOSIS — F17210 Nicotine dependence, cigarettes, uncomplicated: Secondary | ICD-10-CM | POA: Diagnosis not present

## 2019-11-15 DIAGNOSIS — K219 Gastro-esophageal reflux disease without esophagitis: Secondary | ICD-10-CM | POA: Diagnosis not present

## 2019-11-15 DIAGNOSIS — Z9119 Patient's noncompliance with other medical treatment and regimen: Secondary | ICD-10-CM

## 2019-11-15 DIAGNOSIS — D649 Anemia, unspecified: Secondary | ICD-10-CM | POA: Diagnosis not present

## 2019-11-15 DIAGNOSIS — R918 Other nonspecific abnormal finding of lung field: Secondary | ICD-10-CM | POA: Diagnosis not present

## 2019-11-15 DIAGNOSIS — Z79899 Other long term (current) drug therapy: Secondary | ICD-10-CM | POA: Insufficient documentation

## 2019-11-15 DIAGNOSIS — C50911 Malignant neoplasm of unspecified site of right female breast: Secondary | ICD-10-CM

## 2019-11-15 DIAGNOSIS — Z91199 Patient's noncompliance with other medical treatment and regimen due to unspecified reason: Secondary | ICD-10-CM

## 2019-11-15 LAB — COMPREHENSIVE METABOLIC PANEL
ALT: 12 U/L (ref 0–44)
AST: 13 U/L — ABNORMAL LOW (ref 15–41)
Albumin: 4.3 g/dL (ref 3.5–5.0)
Alkaline Phosphatase: 68 U/L (ref 38–126)
Anion gap: 15 (ref 5–15)
BUN: 9 mg/dL (ref 6–20)
CO2: 25 mmol/L (ref 22–32)
Calcium: 9.5 mg/dL (ref 8.9–10.3)
Chloride: 102 mmol/L (ref 98–111)
Creatinine, Ser: 0.93 mg/dL (ref 0.44–1.00)
GFR calc Af Amer: >60 mL/min (ref >60)
GFR calc non Af Amer: >60 mL/min (ref >60)
Glucose, Bld: 120 mg/dL — ABNORMAL HIGH (ref 70–99)
Potassium: 3.3 mmol/L — ABNORMAL LOW (ref 3.5–5.1)
Sodium: 142 mmol/L (ref 135–145)
Total Bilirubin: 0.3 mg/dL (ref 0.3–1.2)
Total Protein: 7.5 g/dL (ref 6.5–8.1)

## 2019-11-15 LAB — CBC WITH DIFFERENTIAL/PLATELET
Abs Immature Granulocytes: 0.02 10*3/uL (ref 0.00–0.07)
Basophils Absolute: 0 10*3/uL (ref 0.0–0.1)
Basophils Relative: 1 %
Eosinophils Absolute: 0.1 10*3/uL (ref 0.0–0.5)
Eosinophils Relative: 2 %
HCT: 36.4 % (ref 36.0–46.0)
Hemoglobin: 11.7 g/dL — ABNORMAL LOW (ref 12.0–15.0)
Immature Granulocytes: 0 %
Lymphocytes Relative: 19 %
Lymphs Abs: 1.2 10*3/uL (ref 0.7–4.0)
MCH: 31 pg (ref 26.0–34.0)
MCHC: 32.1 g/dL (ref 30.0–36.0)
MCV: 96.3 fL (ref 80.0–100.0)
Monocytes Absolute: 0.3 10*3/uL (ref 0.1–1.0)
Monocytes Relative: 5 %
Neutro Abs: 4.8 10*3/uL (ref 1.7–7.7)
Neutrophils Relative %: 73 %
Platelets: 349 10*3/uL (ref 150–400)
RBC: 3.78 MIL/uL — ABNORMAL LOW (ref 3.87–5.11)
RDW: 14.3 % (ref 11.5–15.5)
WBC: 6.5 10*3/uL (ref 4.0–10.5)
nRBC: 0 % (ref 0.0–0.2)

## 2019-11-15 LAB — HCG, QUANTITATIVE, PREGNANCY: hCG, Beta Chain, Quant, S: <1 m[IU]/mL (ref <5)

## 2019-11-15 NOTE — Patient Instructions (Signed)
Coronavirus (COVID-19) Are you at risk?  Are you at risk for the Coronavirus (COVID-19)?  To be considered HIGH RISK for Coronavirus (COVID-19), you have to meet the following criteria:  . Traveled to [place], [place], [place], [place] or [place]; or in the [place] to [place], [place], [place], or [place]; and have fever, cough, and shortness of breath within the last 2 weeks of travel OR . Been in close contact with a person diagnosed with COVID-19 within the last 2 weeks and have fever, cough, and shortness of breath . IF YOU DO NOT MEET THESE CRITERIA, YOU ARE CONSIDERED LOW RISK FOR COVID-19.  What to do if you are HIGH RISK for COVID-19?  . If you are having a medical emergency, call 911. . Seek medical care right away. Before you go to a doctor's office, urgent care or emergency department, call ahead and tell them about your recent travel, contact with someone diagnosed with COVID-19, and your symptoms. You should receive instructions from your physician's office regarding next steps of care.  . When you arrive at healthcare provider, tell the healthcare staff immediately you have returned from visiting [place], [place], [place], [place] or [place]; or traveled in the [place] to [place], [place], [place], or [place]; in the last two weeks or you have been in close contact with a person diagnosed with COVID-19 in the last 2 weeks.   . Tell the health care staff about your symptoms: fever, cough and shortness of breath. . After you have been seen by a medical provider, you will be either: o Tested for (COVID-19) and discharged home on quarantine except to seek medical care if symptoms worsen, and asked to  - Stay home and avoid contact with others until you get your results (4-5 days)  - Avoid travel on public transportation if possible (such as bus, train, or airplane) or o Sent to the Emergency Department by EMS for evaluation, COVID-19 testing, and possible  admission depending on your condition and test results.  What to do if you are LOW RISK for COVID-19?  Reduce your risk of any infection by using the same precautions used for avoiding the common cold or flu:  . Wash your hands often with soap and warm water for at least 20 seconds.  If soap and water are not readily available, use an alcohol-based hand sanitizer with at least 60% alcohol.  . If coughing or sneezing, cover your mouth and nose by coughing or sneezing into the elbow areas of your shirt or coat, into a tissue or into your sleeve (not your hands). . Avoid shaking hands with others and consider head nods or verbal greetings only. . Avoid touching your eyes, nose, or mouth with unwashed hands.  . Avoid close contact with people who are sick. . Avoid places or events with large numbers of people in one location, like concerts or sporting events. . Carefully consider travel plans you have or are making. . If you are planning any travel outside or inside the [place], visit the CDC's Travelers' Health webpage for the latest health notices. . If you have some symptoms but not all symptoms, continue to monitor at home and seek medical attention if your symptoms worsen. . If you are having a medical emergency, call 911.   ADDITIONAL HEALTHCARE OPTIONS FOR PATIENTS   Telehealth / e-Visit: [email or web address]         [facility]: [phone]  [facility]: [phone]                   [facility]: [phone]   

## 2019-11-15 NOTE — Progress Notes (Signed)
[place]  [phone] [phone]    ID: Terri Estes DOB: [birth year]  MR#: [id number]  OFB#:[id number]  Patient Care Team: Patient, No Pcp Per as PCP - General (General Practice) Alphonsa Overall, MD as Consulting Physician (General Surgery) Maryan Sivak, [name], MD as Consulting Physician (Oncology) Kyung Rudd, MD as Consulting Physician (Radiation Oncology) Rockwell Germany, RN as Oncology Nurse Navigator Mauro Kaufmann, RN as Oncology Nurse Navigator Donnamae Jude, MD as Consulting Physician (Obstetrics and Gynecology) Pavelock, Ralene Bathe, MD as Consulting Physician (Internal Medicine) OTHER MD: Joylene Igo for [facility] is [phone]   CHIEF COMPLAINT: Triple negative breast cancer (s/p right mastectomy)  CURRENT TREATMENT: goserelin; [methotrexate]   INTERVAL HISTORY: Terri Estes returns today for follow-up of her estrogen negative breast cancer as well as her psoriasis.  When we saw her last week she had a terrible psoriatic flare, which is imaged below.  We think this is due to the pamidronate worsening the psoriasis and we have discontinued that medication.  We started her on a Medrol Dosepak and gave her 7.5 mg of methotrexate which she received a week ago.  Today her psoriasis already looks better although it certainly is still present and it remains very itchy.  She completed her Medrol Dosepak without event.  She had no side effects from the methotrexate.  Otherwise she continues on goserelin, with her most recent dose given on 11/09/2019.  Since her last visit, she met with Dr. Lisbeth Renshaw, 11/14/2019, to discuss radiation therapy. Per telephone and social worker notes, she opted to proceed with treatment here in [place] despite now living in [place]. She notes she can stay with her godmother during treatments.  However she has had difficulty getting to treatments she says because they were scheduled to early in the morning and she  cannot make it at that time.  REVIEW OF SYSTEMS: Terri Estes does feel cared for and appreciated here and so she is reluctant to go to [facility] for her treatments although it would be easier from that transportation point of view.  Today we impressed on her the fact that she really will need to be here on a daily basis.  We are trying to work with radiation oncology to see if an afternoon appointment would be better.  She tells me transportation currently is not an issue unless it is very early in the morning.  A detailed review of systems today was otherwise stable.   HISTORY OF CURRENT ILLNESS: From the original intake note:  [name] presented with swelling along the left side of the face with neck pain. She then underwent a neck CT on 04/11/2019 showing: Enlarged left level 2 lymph nodes with associated inflammatory change including stranding of the adjacent fat and thickening of the platysma. While malignancy is not excluded, this is most concerning for infection. No focal abscess is present. Hypoattenuation within 1 of the left level 2 lymph nodes likely represents central necrosis or suppuration of the node. No primary malignancy or other abscess. Left upper lobe pulmonary nodules may be inflammatory. The largest measures 0.7 x 0.7 x 0.6 cm. CT of the chest with contrast may be useful for further evaluation.  She also underwent a chest CT on the same day showing: Complex right breast mass measuring 5.6 x 4.5 x 4.7 cm consistent with a primary breast carcinoma. Malignant right axillary adenopathy measuring up to 3.1 cm. Multiple bilateral pulmonary nodules are concerning for metastatic disease. Given the right breast mass,  the left cervical lymph nodes are more likely malignant.  She then underwent bilateral diagnostic mammography with tomography and right breast ultrasonography at [facility] on 04/19/2019 showing: Breast Density Category C; findings which are highly suspicious for  multicentric inflammatory right breast cancer with right axillary nodal metastatic disease; no mammographic evidence of malignancy involving the left breast.  Accordingly on 04/19/2019 she proceeded to biopsy of the right breast area in question. The pathology from this procedure showed ([id number]): invasive ductal carcinoma, grade III, upper inner quadrant, 12:30 o'clock, 5.0 cm from the nipple. Prognostic indicators significant for: estrogen receptor, 0% negative and progesterone receptor, 0% negative. Proliferation marker Ki67 at 90%. HER2 negative (0+).  Additional biopsies of the right breast and right axilla were performed on the same day. The pathology from this procedure showed ([id number]): 2. Breast, right, needle core biopsy, satellite mass UOQ, 10 o'clock, 8cmfn  - Invasive ductal carcinoma, grade III 3. Lymph node, needle/core biopsy, level 1 right axilla   - Invasive ductal carcinoma, grade III  The patient's subsequent history is as detailed below.   PAST MEDICAL HISTORY: Past Medical History:  Diagnosis Date  . Anemia    after pregnancy  . Anxiety    severe not taking meds  . Asthma    inhaler  4x per day  . Chronic pain   . Depression   . Family history of breast cancer   . GERD (gastroesophageal reflux disease)   . History of substance abuse ([place])   . Pregnancy induced hypertension    takes medication now.  . Psoriasis    all over body  . Trichomonas infection   . Vaginal Pap smear, abnormal     PAST SURGICAL HISTORY: Past Surgical History:  Procedure Laterality Date  . CESAREAN SECTION  05/26/2008  . CESAREAN SECTION N/A 07/10/2017   Procedure: CESAREAN SECTION;  Surgeon: Florian Buff, MD;  Location: [facility];  Service: Obstetrics;  Laterality: N/A;  . CESAREAN SECTION N/A 09/23/2018   Procedure: CESAREAN SECTION;  Surgeon: Donnamae Jude, MD;  Location: [facility];  Service: Obstetrics;  Laterality: N/A;  . COLPOSCOPY    . MASTECTOMY  WITH AXILLARY LYMPH NODE DISSECTION Right 10/16/2019   Procedure: RIGHT MASTECTOMY WITH AXILLARY LYMPH NODE DISSECTION;  Surgeon: Alphonsa Overall, MD;  Location: [facility];  Service: General;  Laterality: Right;  . PORTACATH PLACEMENT Left 04/30/2019   Procedure: INSERTION PORT-A-CATH WITH ULTRASOUND;  Surgeon: Alphonsa Overall, MD;  Location: [facility];  Service: General;  Laterality: Left;  Left Subclavian vein  . WISDOM TOOTH EXTRACTION      FAMILY HISTORY: Family History  Problem Relation Age of Onset  . Kidney disease Maternal Grandmother   . Breast cancer Maternal Grandmother 28       metastatic to liver; d. 31  . Kidney disease Maternal Grandfather   . Kidney disease Paternal Grandmother   . Diabetes Paternal Grandmother   . Breast cancer Paternal Grandmother   . Kidney disease Paternal Grandfather   . Breast cancer Paternal Aunt   . Cerebral palsy Child   . Breast cancer Paternal Great-grandmother   . Pancreatic cancer Other        PGMs brother   . Throat cancer Paternal Uncle   . Breast cancer Other        PGMs sister  (Updated 04/25/2019) Georga's father is living at age 45. Patients' mother is also living at age 56. [place] notes, however, that she was raised by her  grandmother.) The patient has 0 brothers and 5 sisters. Patient denies anyone in her family having ovarian, prostate, or pancreatic cancer. Her maternal grandmother was diagnosed with breast cancer, unsure what age. On her father's side, she reports her grandmother had cysts in her breasts, her great-aunt might have had breast cancer, and her great-grandmother was diagnosed with breast cancer at an older age.   GYNECOLOGIC HISTORY:  No LMP recorded (lmp unknown). (Menstrual status: Chemotherapy).  She had an emergent C-section in 09/23/2018 for abruptio placenta at 30 weeks pregnancy, also has a history of eclampsia Menarche:    years old Age at first live birth: 33 years old [place] P: 3 LMP: 04/18/2019 Contraceptive: On  goserelin Hysterectomy?: no BSO?: no   SOCIAL HISTORY: (Current as of August 2020) Terri Estes is currently unemployed. She previously worked at [employer]. She is currently engaged. Fiance [name] runs a Midwife and works other odd jobs.  The patient recently moved to [place].  Daughter Demetrius Charity, age 64, was abused by her father at 55 months and has cerebral palsy. Son [name], age 36, has severe asthma and is developmentally disabled. Daughter Yetta Glassman was born on 09/23/2018 prematurely and remains on oxygen at home.  The patient's aunt has been staying with her and is helping with the children and providing general support   ADVANCED DIRECTIVES: not in place.    HEALTH MAINTENANCE: Social History   Tobacco Use  . Smoking status: Current Every Day Smoker    Packs/day: 0.10    Years: 0.00    Pack years: 0.00    Types: Cigarettes  . Smokeless tobacco: Never Used  . Tobacco comment: trying to quit - smokes 2 cigarettes/day  Substance Use Topics  . Alcohol use: No  . Drug use: Yes    Frequency: 7.0 times per week    Types: Marijuana    Comment: subutex last dose 10/15/19, last dose Sun 10/14/19    Colonoscopy: n/a  PAP: 05/10/2018  Bone density: never done Mammography: first performed following abnormal CT  No Known Allergies   Current Outpatient Medications  Medication Sig Dispense Refill  . acyclovir ointment (ZOVIRAX) 5 % Apply 1 application topically 2 (two) times daily as needed. Mix 1 part acyclovir cream with 1 part over the counter hydrocortisone 1% cream and apply to affected area two times a day as needed 5 g 0  . albuterol (PROVENTIL HFA;VENTOLIN HFA) 108 (90 Base) MCG/ACT inhaler Inhale 2 puffs into the lungs 4 (four) times daily. 1 Inhaler 2  . buprenorphine (SUBUTEX) 8 MG SUBL SL tablet Place 8 mg under the tongue 2 (two) times daily.     . cholestyramine (QUESTRAN) 4 g packet Take 1 packet (4 g total) by mouth 3 (three) times daily with meals. 60 each 12   . cyclobenzaprine (FLEXERIL) 10 MG tablet Take 1 tablet (10 mg total) by mouth 3 (three) times daily as needed for muscle spasms. 90 tablet 2  . enalapril (VASOTEC) 5 MG tablet Take 1 tablet (5 mg total) by mouth daily. (Patient not taking: Reported on 11/15/2019) 90 tablet 4  . folic acid (FOLVITE) 1 MG tablet Take 1 tablet (1 mg total) by mouth daily. 60 tablet 3  . gabapentin (NEURONTIN) 300 MG capsule TAKE 1 CAPSULE(300 MG) BY MOUTH THREE TIMES DAILY (Patient taking differently: Take 300 mg by mouth 3 (three) times daily. ) 90 capsule 0  . HYDROcodone-acetaminophen (NORCO/VICODIN) 5-325 MG tablet Take 1 tablet by mouth every 6 (six) hours as  needed for moderate pain. (Patient not taking: Reported on 11/15/2019) 15 tablet 0  . hydrocortisone (ANUSOL-HC) 2.5 % rectal cream Place 1 application rectally 2 (two) times daily. 60 g 0  . ketoconazole (NIZORAL) 2 % cream Apply 1 application topically daily. 15 g 0  . methotrexate (RHEUMATREX) 7.5 MG tablet Take 2 tablets (15 mg total) by mouth once a week. Caution" Chemotherapy. Protect from light. 4 tablet 0  . omeprazole (PRILOSEC) 40 MG capsule Take 1 capsule (40 mg total) by mouth daily. 30 capsule 5  . ondansetron (ZOFRAN) 8 MG tablet TAKE 1 TABLET(8 MG) BY MOUTH EVERY 8 HOURS AS NEEDED FOR NAUSEA OR VOMITING (Patient taking differently: Take 8 mg by mouth every 8 (eight) hours as needed for nausea or vomiting. ) 20 tablet 0  . oxyCODONE-acetaminophen (PERCOCET/ROXICET) 5-325 MG tablet Take 1-2 tablets by mouth every 6 (six) hours as needed for severe pain. (Patient not taking: Reported on 11/15/2019) 10 tablet 0  . PARoxetine (PAXIL) 10 MG tablet Take 1 tablet (10 mg total) by mouth daily. (Patient not taking: Reported on 11/15/2019) 30 tablet 5  . predniSONE (DELTASONE) 5 MG tablet Take 6 tablets today, 5 tablets the next day, 4 tablets the next day, 3 tablets the next day, 2 tabl;ets the next day and one tablet the last day (Patient not taking:  Reported on 11/15/2019) 21 tablet 0  . Prenatal MV-Min-FA-Omega-3 (PRENATAL GUMMIES/DHA & FA) 0.4-32.5 MG CHEW Chew 2 each by mouth daily.    . valACYclovir (VALTREX) 1000 MG tablet Take 1 tablet (1,000 mg total) by mouth 2 (two) times daily. 60 tablet 0   No current facility-administered medications for this visit.    Facility-Administered Medications Ordered in Other Visits  Medication Dose Route Frequency Provider Last Rate Last Dose  . 0.9 % NaCl with KCl 20 mEq/ L  infusion   Intravenous Once Daylin Gruszka, [name], MD      . sodium chloride flush (NS) 0.9 % injection 10 mL  10 mL Intracatheter PRN Zackry Deines, [name], MD        OBJECTIVE: Young African-American woman who appears stated age  Vitals:   11/16/19 1240  BP: 123/81  Pulse: 67  Resp: 18  Temp: 98 F (36.7 C)  SpO2: 100%   Wt Readings from Last 3 Encounters:  11/16/19 176 lb 8 oz (80.1 kg)  11/15/19 175 lb 12.8 oz (79.7 kg)  11/09/19 175 lb 3.2 oz (79.5 kg)   Body mass index is 29.37 kg/m.      ECOG FS:1 - Symptomatic but completely ambulatory  Sclerae unicteric, EOMs intact Wearing a mask Lungs no rales or rhonchi Heart regular rate and rhythm Abd soft, nontender, positive bowel sounds Neuro: nonfocal, well oriented, appropriate affect Breasts: Deferred Skin: The psoriatic lesions are flatter and less inflamed.        LAB RESULTS:  CMP     Component Value Date/Time   NA 142 11/15/2019 1420   NA 137 05/10/2018 1535   K 3.3 (L) 11/15/2019 1420   CL 102 11/15/2019 1420   CO2 25 11/15/2019 1420   GLUCOSE 120 (H) 11/15/2019 1420   BUN 9 11/15/2019 1420   BUN 7 05/10/2018 1535   CREATININE 0.93 11/15/2019 1420   CREATININE 0.81 07/03/2019 0919   CALCIUM 9.5 11/15/2019 1420   PROT 7.5 11/15/2019 1420   PROT 7.2 05/10/2018 1535   ALBUMIN 4.3 11/15/2019 1420   ALBUMIN 4.2 05/10/2018 1535   AST 13 (L) 11/15/2019 1420  AST 54 (H) 07/03/2019 0919   ALT 12 11/15/2019 1420   ALT 102 (H)  07/03/2019 0919   ALKPHOS 68 11/15/2019 1420   BILITOT 0.3 11/15/2019 1420   BILITOT <0.2 (L) 07/03/2019 0919   GFRNONAA >60 11/15/2019 1420   GFRNONAA >60 07/03/2019 0919   GFRAA >60 11/15/2019 1420   GFRAA >60 07/03/2019 0919    No results found for: TOTALPROTELP, ALBUMINELP, A1GS, A2GS, BETS, BETA2SER, GAMS, MSPIKE, SPEI  No results found for: KPAFRELGTCHN, LAMBDASER, KAPLAMBRATIO  Lab Results  Component Value Date   WBC 6.5 11/15/2019   NEUTROABS 4.8 11/15/2019   HGB 11.7 (L) 11/15/2019   HCT 36.4 11/15/2019   MCV 96.3 11/15/2019   PLT 349 11/15/2019    No results found for: LABCA2  No components found for: [id number]  No results for input(s): INR in the last 168 hours.  No results found for: LABCA2  No results found for: SKA768  No results found for: TLX726  No results found for: OMB559  No results found for: CA2729  No components found for: HGQUANT  No results found for: CEA1 / No results found for: CEA1   No results found for: AFPTUMOR  No results found for: CHROMOGRNA  No results found for: PSA1  Orders Only on 11/15/2019  Component Date Value Ref Range Status  . hCG, Beta Chain, Quant, S 11/15/2019 <1  <5 mIU/mL Final   Comment:          GEST. AGE      CONC.  (mIU/mL)   <=1 WEEK        5 - 50     2 WEEKS       50 - 500     3 WEEKS       100 - 10,000     4 WEEKS     1,000 - 30,000     5 WEEKS     3,500 - 115,000   6-8 WEEKS     12,000 - 270,000    12 WEEKS     15,000 - 220,000        FEMALE AND NON-PREGNANT FEMALE:     LESS THAN 5 mIU/mL Performed at [facility], [place] [address] [place], [place] [address]   Hospital Outpatient Visit on 11/15/2019  Component Date Value Ref Range Status  . Sodium 11/15/2019 142  135 - 145 mmol/L Final  . Potassium 11/15/2019 3.3* 3.5 - 5.1 mmol/L Final  . Chloride 11/15/2019 102  98 - 111 mmol/L Final  . CO2 11/15/2019 25  22 - 32 mmol/L Final  . Glucose, Bld 11/15/2019 120* 70 - 99 mg/dL  Final  . BUN 11/15/2019 9  6 - 20 mg/dL Final  . Creatinine, Ser 11/15/2019 0.93  0.44 - 1.00 mg/dL Final  . Calcium 11/15/2019 9.5  8.9 - 10.3 mg/dL Final  . Total Protein 11/15/2019 7.5  6.5 - 8.1 g/dL Final  . Albumin 11/15/2019 4.3  3.5 - 5.0 g/dL Final  . AST 11/15/2019 13* 15 - 41 U/L Final  . ALT 11/15/2019 12  0 - 44 U/L Final  . Alkaline Phosphatase 11/15/2019 68  38 - 126 U/L Final  . Total Bilirubin 11/15/2019 0.3  0.3 - 1.2 mg/dL Final  . GFR calc non Af Amer 11/15/2019 >60  >60 mL/min Final  . GFR calc Af Amer 11/15/2019 >60  >60 mL/min Final  . Anion gap 11/15/2019 15  5 - 15 Final   Performed at [facility], [place] [address], [place], [place] [address]  . WBC 11/15/2019 6.5  4.0 - 10.5 K/uL Final  . RBC 11/15/2019 3.78* 3.87 - 5.11 MIL/uL Final  . Hemoglobin 11/15/2019 11.7* 12.0 - 15.0 g/dL Final  . HCT 11/15/2019 36.4  36.0 - 46.0 % Final  . MCV 11/15/2019 96.3  80.0 - 100.0 fL Final  . MCH 11/15/2019 31.0  26.0 - 34.0 pg Final  . MCHC 11/15/2019 32.1  30.0 - 36.0 g/dL Final  . RDW 11/15/2019 14.3  11.5 - 15.5 % Final  . Platelets 11/15/2019 349  150 - 400 K/uL Final  . nRBC 11/15/2019 0.0  0.0 - 0.2 % Final  . Neutrophils Relative % 11/15/2019 73  % Final  . Neutro Abs 11/15/2019 4.8  1.7 - 7.7 K/uL Final  . Lymphocytes Relative 11/15/2019 19  % Final  . Lymphs Abs 11/15/2019 1.2  0.7 - 4.0 K/uL Final  . Monocytes Relative 11/15/2019 5  % Final  . Monocytes Absolute 11/15/2019 0.3  0.1 - 1.0 K/uL Final  . Eosinophils Relative 11/15/2019 2  % Final  . Eosinophils Absolute 11/15/2019 0.1  0.0 - 0.5 K/uL Final  . Basophils Relative 11/15/2019 1  % Final  . Basophils Absolute 11/15/2019 0.0  0.0 - 0.1 K/uL Final  . Immature Granulocytes 11/15/2019 0  % Final  . Abs Immature Granulocytes 11/15/2019 0.02  0.00 - 0.07 K/uL Final   Performed at [facility], [place] [address] [place], [place] [address]    (this displays  the last labs from the last 3 days)  No results found for: TOTALPROTELP, ALBUMINELP, A1GS, A2GS, BETS, BETA2SER, GAMS, MSPIKE, SPEI (this displays SPEP labs)  No results found for: KPAFRELGTCHN, LAMBDASER, KAPLAMBRATIO (kappa/lambda light chains)  Lab Results  Component Value Date   HGBA 97.4 12/16/2016   (Hemoglobinopathy evaluation)   No results found for: LDH  No results found for: IRON, TIBC, IRONPCTSAT (Iron and TIBC)  Lab Results  Component Value Date   FERRITIN 900 (H) 09/12/2019    Urinalysis    Component Value Date/Time   COLORURINE YELLOW 06/26/2019 1300   APPEARANCEUR HAZY (A) 06/26/2019 1300   LABSPEC 1.019 06/26/2019 1300   PHURINE 6.0 06/26/2019 1300   GLUCOSEU NEGATIVE 06/26/2019 1300   HGBUR NEGATIVE 06/26/2019 1300   BILIRUBINUR NEGATIVE 06/26/2019 1300   KETONESUR NEGATIVE 06/26/2019 1300   PROTEINUR NEGATIVE 06/26/2019 1300   UROBILINOGEN 4.0 (H) 01/13/2018 1417   NITRITE NEGATIVE 06/26/2019 1300   LEUKOCYTESUR LARGE (A) 06/26/2019 1300     STUDIES:  Dg Chest 2 View  Result Date: 10/30/2019 CLINICAL DATA:  Right breast pain and swelling for 6 days. Drainage catheters in the right breast. History of right breast mastectomy 10/16/2019. EXAM: CHEST - 2 VIEW COMPARISON:  04/30/2019 FINDINGS: The left subclavian power port is in good position. No complicating features. The heart is normal in size. The mediastinal and hilar contours are stable. The lungs are clear.  No pleural effusion. Two right-sided drainage catheters are noted in the right breast area. IMPRESSION: No acute cardiopulmonary findings or worrisome pulmonary lesions. Port-A-Cath in good position without complicating features. Right sided chest wall drainage catheters are noted. Electronically Signed   By: Marijo Sanes M.D.   On: 10/30/2019 21:42     ELIGIBLE FOR AVAILABLE RESEARCH PROTOCOL: no   ASSESSMENT: 33 y.o. [place]. [place] woman status post right breast biopsy x2 and right axillary  lymph node biopsy 04/19/2019 for a  clinical T4 N2 MX, stage IIIC invasive ductal carcinoma, grade 3, triple negative, with an MIB-1 of 90%  (a) chest CT scan 04/11/2019 shows multiple subcentimeter lung nodules  (b) PET scan denied by insurance  (c) CT A/P and bone scan due 05/09/2019 show a very small low-density lesion in the lateral segment of the left liver which is indeterminate, and sclerosis of the right SI joint, which is felt to be likely degenerative; bone scan was entirely negative.  (c) baseline breast MRI 04/30/2019 shows involvement of all 4 quadrants in the right breast as well as right axillary subpectoral and internal mammary lymphadenopathy.  Left breast was unremarkable  (1) neoadjuvant chemotherapy consisting of  (a) pembrolizumab started 05/03/2019, third dose delayed because of elevated liver function tests, resumed 08/02/2019  (b) paclitaxel weekly x12 with carboplatin every 21 days x 4, started 05/08/2019   (i) tolerated carboplatin AUC 5-day 1 poorly, switched to AUC 2 on day 1 day 8   (ii) cycle 2 delayed 1 week because of elevated liver function tests   (iii) carboplatin and paclitaxel discontinued after 07/03/2019 because of neuropathy, (received 7 weekly doses paclitaxel and one AUC5 carbo dose, four AUC2 doses)  (c) cyclophosphamide and doxorubicin x4 started 07/03/2019, completed 08/29/2019   (i) 2d dose given day 28 due to poor tolerance; other doses Q 21 days   (2) s/p right mastectomy and ALND 10/16/2019 showing a residual ypT2 ypN2 invasive ductal carcinoma, grade 3, with negative margins  (a) a total of 10 lymph nodes were removed, 4 with macrometastases, 1 with a micrometastasis  (3) adjuvant radiation to follow, consider capecitabine sensitization  (4) genetics testing  (a) genetic testing 05/14/2019 through the Common Hereditary Gene Panel offered by [facility] found no deleterious mutations in APC, ATM, AXIN2, BARD1, BMPR1A, BRCA1, BRCA2, BRIP1, CDH1, CDK4,  CDKN2A (p14ARF), CDKN2A (p16INK4a), CHEK2, CTNNA1, DICER1, EPCAM (Deletion/duplication testing only), GREM1 (promoter region deletion/duplication testing only), KIT, MEN1, MLH1, MSH2, MSH3, MSH6, MUTYH, NBN, NF1, NHTL1, PALB2, PDGFRA, PMS2, POLD1, POLE, PTEN, RAD50, RAD51C, RAD51D, RNF43, SDHB, SDHC, SDHD, SMAD4, SMARCA4. STK11, TP53, TSC1, TSC2, and VHL.  The following genes were evaluated for sequence changes only: SDHA and HOXB13 c.251G>A variant only  (5) goserelin started 05/08/2019  (6) pembrolizumab started 08/02/2019, stopped after fourth dose (10/02/2019) with psoriasis flare  (7) psoriasis:  (a) weekly methotrexate started 11/10/2019, held after 11/17/2019 dose so as not to overlap with radiation  (b) intravenous cyclophosphamide 1 g every 3 weeks starting 11/21/2019     PLAN: Terri Estes tolerated the methotrexate well and it appears to be working.  She will receive a second dose tomorrow.  Unfortunately we cannot continue this through her radiation treatments.  I am going to switch her to intravenous cyclophosphamide and she will receive her first dose 11/21/2019.  She will see me that same day.  As noted above we emphasized the fact that once she starts radiation she really needs to, on a daily basis.  If transportation is an issue she can speak to our Education officer, museum who can facilitate that.  Once the radiation is done she will resume weekly methotrexate until the psoriasis is under better control.  We have tried to find a dermatologist in town who is willing to take Medicaid but so far have not been able to connect with one  She knows to call for any other issue that may develop before the next visit.  Wiletta Bermingham, [name], MD  11/16/19 2:16 PM [facility]  [address] [place], [place] [address] Tel. [phone]    Fax. [phone]   I, Wilburn Mylar, am acting as scribe for Dr. [name]. Orvis Stann.  I, Lurline Del MD,  have reviewed the above documentation for accuracy and completeness, and I agree with the above.

## 2019-11-16 ENCOUNTER — Ambulatory Visit
Admission: RE | Admit: 2019-11-16 | Discharge: 2019-11-16 | Disposition: A | Discharge status: Home / Self Care | Payer: Medicaid Other | Source: Ambulatory Visit | Attending: Radiation Oncology | Admitting: Radiation Oncology

## 2019-11-16 ENCOUNTER — Encounter: Payer: Self-pay | Admitting: Oncology

## 2019-11-16 ENCOUNTER — Inpatient Hospital Stay (HOSPITAL_BASED_OUTPATIENT_CLINIC_OR_DEPARTMENT_OTHER): Payer: Medicaid Other | Admitting: Oncology

## 2019-11-16 ENCOUNTER — Other Ambulatory Visit: Payer: Self-pay

## 2019-11-16 VITALS — BP 123/81 | HR 67 | Temp 98.0°F | Resp 18 | Ht 65.0 in | Wt 176.5 lb

## 2019-11-16 DIAGNOSIS — C50811 Malignant neoplasm of overlapping sites of right female breast: Secondary | ICD-10-CM

## 2019-11-16 DIAGNOSIS — Z5111 Encounter for antineoplastic chemotherapy: Secondary | ICD-10-CM | POA: Diagnosis not present

## 2019-11-16 DIAGNOSIS — Z171 Estrogen receptor negative status [ER-]: Secondary | ICD-10-CM

## 2019-11-16 NOTE — Progress Notes (Signed)
Radiation Oncology         [phone] ________________________________  Name: Terri Estes        MRN: [id number]  Date of Service: 11/15/2019 DOB: [birth year]  RC:BULAGTX, No Pcp Per  Magrinat, [name], MD     REFERRING PHYSICIAN: Magrinat, [name], MD   DIAGNOSIS: The encounter diagnosis was Malignant neoplasm of overlapping sites of right breast in female, estrogen receptor negative ([place]).   HISTORY OF PRESENT ILLNESS: Terri Estes is a 33 y.o. female seen for a diagnosis of right breast cancer. The patient was noted to have a mass seen on a CT of the neck chest while she was undergoing a work up for neck pain. This was performed on 04/11/2019 revealing enlarged level 2 nodes and hypoattenuation within one of the left level 2 nodes. There was also LUL nodules the largest being 7 mm, and the CT chest revealed a 5.6 x 4.5 x 4.7 cm mass in the right breast with marked skin thickening , a 12 x 13 x 14 mm right axillary node and a lateral right axillary mass measuring 3.1 x 1.9 cm. She underwent further breast diagnostic imaging on 04/19/2019 which revealed a large mass in the right breast on mammogram. Further diagnostic ultrasound revealed a mass measuring 3 x 5.1 x 6.6 cm at 12:30. 2 satellite masses were noted measuring 15 mm and 13 mm, a solid mass was seen at 10:00 measuring 8 mm, and extensive edema throughout the right breast was noted with marked skin thickening measuring up to 1.3 cm in periareolar location. There were 3 pathologic appearing lymph nodes the largest measured 2.5 x 2 x 2.5 cm. She underwent a biopsy on that date as well revealing an invasive ductal carcinoma in the upper outer quadrant at 1230 o'clock. This was also seen at 10:00, and one of her right axillary nodes was also sampled and consistent with an invasive carcinoma. Her tumor was felt to be grade 3, and her receptors were triple negative with a Ki-67 of 90%. She was counseled on neoadjuvant chemotherapy  and began this regimen on 05/08/2019. She completed her last cycle on 08/29/2019 she was also given single agent Keytruda on 09/12/2019 and 10/02/2019. She subsequently underwent right mastectomy with targeted node dissection on 10/16/2019 revealing a grade 3 invasive ductal carcinoma with residual disease measuring 3.4 cm with central cavitation, and associated high-grade DCIS. Her resection margins were negative for carcinoma. Of the total 10 lymph nodes sampled for had macro metastases and one had a micrometastasis. Of note she also developed a flare of psoriasis and when she was seen last Friday by Dr. Jana Hakim he recommended treating her with methotrexate and prednisone taper. She began this on 11/09/2019. She is seen today to discuss the rationale for postmastectomy radiotherapy.   PREVIOUS RADIATION THERAPY: No   PAST MEDICAL HISTORY:  Past Medical History:  Diagnosis Date   Anemia    after pregnancy   Anxiety    severe not taking meds   Asthma    inhaler  4x per day   Chronic pain    Depression    Family history of breast cancer    GERD (gastroesophageal reflux disease)    History of substance abuse ([place])    Pregnancy induced hypertension    takes medication now.   Psoriasis    all over body   Trichomonas infection    Vaginal Pap smear, abnormal        PAST SURGICAL HISTORY:  Past Surgical History:  Procedure Laterality Date   CESAREAN SECTION  05/26/2008   CESAREAN SECTION N/A 07/10/2017   Procedure: CESAREAN SECTION;  Surgeon: Florian Buff, MD;  Location: [facility];  Service: Obstetrics;  Laterality: N/A;   CESAREAN SECTION N/A 09/23/2018   Procedure: CESAREAN SECTION;  Surgeon: Donnamae Jude, MD;  Location: [facility];  Service: Obstetrics;  Laterality: N/A;   COLPOSCOPY     MASTECTOMY WITH AXILLARY LYMPH NODE DISSECTION Right 10/16/2019   Procedure: RIGHT MASTECTOMY WITH AXILLARY LYMPH NODE DISSECTION;  Surgeon: Alphonsa Overall, MD;   Location: [facility];  Service: General;  Laterality: Right;   PORTACATH PLACEMENT Left 04/30/2019   Procedure: INSERTION PORT-A-CATH WITH ULTRASOUND;  Surgeon: Alphonsa Overall, MD;  Location: [facility];  Service: General;  Laterality: Left;  Left Subclavian vein   WISDOM TOOTH EXTRACTION       FAMILY HISTORY:  Family History  Problem Relation Age of Onset   Kidney disease Maternal Grandmother    Breast cancer Maternal Grandmother 28       metastatic to liver; d. 32   Kidney disease Maternal Grandfather    Kidney disease Paternal Grandmother    Diabetes Paternal Grandmother    Breast cancer Paternal Grandmother    Kidney disease Paternal Grandfather    Breast cancer Paternal Aunt    Cerebral palsy Child    Breast cancer Paternal Great-grandmother    Pancreatic cancer Other        PGMs brother    Throat cancer Paternal Uncle    Breast cancer Other        PGMs sister     SOCIAL HISTORY:  reports that she has been smoking cigarettes. She has been smoking about 0.10 packs per day for the past 0.00 years. She has never used smokeless tobacco. She reports current drug use. Frequency: 7.00 times per week. Drug: Marijuana. She reports that she does not drink alcohol.   The patient is single but in a relationship and is engaged. She has 3 children all of which have developmental delays.    ALLERGIES: Patient has no known allergies.   MEDICATIONS:  Current Outpatient Medications  Medication Sig Dispense Refill   acyclovir ointment (ZOVIRAX) 5 % Apply 1 application topically 2 (two) times daily as needed. Mix 1 part acyclovir cream with 1 part over the counter hydrocortisone 1% cream and apply to affected area two times a day as needed 5 g 0   albuterol (PROVENTIL HFA;VENTOLIN HFA) 108 (90 Base) MCG/ACT inhaler Inhale 2 puffs into the lungs 4 (four) times daily. 1 Inhaler 2   buprenorphine (SUBUTEX) 8 MG SUBL SL tablet Place 8 mg under the tongue 2 (two) times daily.       cholestyramine (QUESTRAN) 4 g packet Take 1 packet (4 g total) by mouth 3 (three) times daily with meals. 60 each 12   cyclobenzaprine (FLEXERIL) 10 MG tablet Take 1 tablet (10 mg total) by mouth 3 (three) times daily as needed for muscle spasms. 90 tablet 2   folic acid (FOLVITE) 1 MG tablet Take 1 tablet (1 mg total) by mouth daily. 60 tablet 3   gabapentin (NEURONTIN) 300 MG capsule TAKE 1 CAPSULE(300 MG) BY MOUTH THREE TIMES DAILY (Patient taking differently: Take 300 mg by mouth 3 (three) times daily. ) 90 capsule 0   hydrocortisone (ANUSOL-HC) 2.5 % rectal cream Place 1 application rectally 2 (two) times daily. 60 g 0   ketoconazole (NIZORAL) 2 % cream Apply 1  application topically daily. 15 g 0   methotrexate (RHEUMATREX) 7.5 MG tablet Take 2 tablets (15 mg total) by mouth once a week. Caution" Chemotherapy. Protect from light. 4 tablet 0   omeprazole (PRILOSEC) 40 MG capsule Take 1 capsule (40 mg total) by mouth daily. 30 capsule 5   ondansetron (ZOFRAN) 8 MG tablet TAKE 1 TABLET(8 MG) BY MOUTH EVERY 8 HOURS AS NEEDED FOR NAUSEA OR VOMITING (Patient taking differently: Take 8 mg by mouth every 8 (eight) hours as needed for nausea or vomiting. ) 20 tablet 0   Prenatal MV-Min-FA-Omega-3 (PRENATAL GUMMIES/DHA & FA) 0.4-32.5 MG CHEW Chew 2 each by mouth daily.     valACYclovir (VALTREX) 1000 MG tablet Take 1 tablet (1,000 mg total) by mouth 2 (two) times daily. 60 tablet 0   enalapril (VASOTEC) 5 MG tablet Take 1 tablet (5 mg total) by mouth daily. (Patient not taking: Reported on 11/15/2019) 90 tablet 4   HYDROcodone-acetaminophen (NORCO/VICODIN) 5-325 MG tablet Take 1 tablet by mouth every 6 (six) hours as needed for moderate pain. (Patient not taking: Reported on 11/15/2019) 15 tablet 0   oxyCODONE-acetaminophen (PERCOCET/ROXICET) 5-325 MG tablet Take 1-2 tablets by mouth every 6 (six) hours as needed for severe pain. (Patient not taking: Reported on 11/15/2019) 10 tablet 0    PARoxetine (PAXIL) 10 MG tablet Take 1 tablet (10 mg total) by mouth daily. (Patient not taking: Reported on 11/15/2019) 30 tablet 5   predniSONE (DELTASONE) 5 MG tablet Take 6 tablets today, 5 tablets the next day, 4 tablets the next day, 3 tablets the next day, 2 tabl;ets the next day and one tablet the last day (Patient not taking: Reported on 11/15/2019) 21 tablet 0   No current facility-administered medications for this encounter.    Facility-Administered Medications Ordered in Other Encounters  Medication Dose Route Frequency Provider Last Rate Last Dose   0.9 % NaCl with KCl 20 mEq/ L  infusion   Intravenous Once Magrinat, [name], MD       sodium chloride flush (NS) 0.9 % injection 10 mL  10 mL Intracatheter PRN Magrinat, [name], MD         REVIEW OF SYSTEMS: On review of systems, the patient reports that  is doing okay but is having a terrible time with itching of her skin from her psoriasis.  She denies any chest pain, shortness of breath, cough, fevers, chills, night sweats, unintended weight changes. She denies any bowel or bladder disturbances, and denies abdominal pain, nausea or vomiting. She denies any new musculoskeletal or joint aches or pains. A complete review of systems is obtained and is otherwise negative.     PHYSICAL EXAM:  Wt Readings from Last 3 Encounters:  11/15/19 175 lb 12.8 oz (79.7 kg)  11/09/19 175 lb 3.2 oz (79.5 kg)  10/16/19 191 lb 4.8 oz (86.8 kg)   Temp Readings from Last 3 Encounters:  11/15/19 98.2 F (36.8 C) (Temporal)  11/09/19 98.9 F (37.2 C) (Temporal)  10/30/19 98.8 F (37.1 C) (Oral)   BP Readings from Last 3 Encounters:  11/15/19 100/76  11/09/19 (!) 121/101  10/31/19 104/75   Pulse Readings from Last 3 Encounters:  11/15/19 75  11/09/19 (!) 113  10/31/19 100   Pain Assessment Pain Score: 0-No pain Pain Frequency: Constant Pain Loc: (itching)/10  In general this is a well appearing African American female in no acute  distress. She's alert and oriented x4 and appropriate throughout the examination. She has diffuse psoriatic  plaques of her trunk and extremities involving her soles and palms as well. Cardiopulmonary assessment is negative for acute distress and she exhibits normal effort. Right mastectomy site also has diffuse plaques but is well healed without edema. She does have some tightness of her right axilla with possible cording noted.    ECOG = 1  0 - Asymptomatic (Fully active, able to carry on all predisease activities without restriction)  1 - Symptomatic but completely ambulatory (Restricted in physically strenuous activity but ambulatory and able to carry out work of a light or sedentary nature. For example, light housework, office work)  2 - Symptomatic, <50% in bed during the day (Ambulatory and capable of all self care but unable to carry out any work activities. Up and about more than 50% of waking hours)  3 - Symptomatic, >50% in bed, but not bedbound (Capable of only limited self-care, confined to bed or chair 50% or more of waking hours)  4 - Bedbound (Completely disabled. Cannot carry on any self-care. Totally confined to bed or chair)  5 - Death   Terri Estes MM, Creech RH, Tormey DC, et al. [phone]). "Toxicity and response criteria of the [facility]". [place] Oncol. 5 (6): 649-55    LABORATORY DATA:  Lab Results  Component Value Date   WBC 6.5 11/15/2019   HGB 11.7 (L) 11/15/2019   HCT 36.4 11/15/2019   MCV 96.3 11/15/2019   PLT 349 11/15/2019   Lab Results  Component Value Date   NA 142 11/15/2019   K 3.3 (L) 11/15/2019   CL 102 11/15/2019   CO2 25 11/15/2019   Lab Results  Component Value Date   ALT 12 11/15/2019   AST 13 (L) 11/15/2019   ALKPHOS 68 11/15/2019   BILITOT 0.3 11/15/2019      RADIOGRAPHY: Dg Chest 2 View  Result Date: 10/30/2019 CLINICAL DATA:  Right breast pain and swelling for 6 days. Drainage catheters in the right  breast. History of right breast mastectomy 10/16/2019. EXAM: CHEST - 2 VIEW COMPARISON:  04/30/2019 FINDINGS: The left subclavian power port is in good position. No complicating features. The heart is normal in size. The mediastinal and hilar contours are stable. The lungs are clear.  No pleural effusion. Two right-sided drainage catheters are noted in the right breast area. IMPRESSION: No acute cardiopulmonary findings or worrisome pulmonary lesions. Port-A-Cath in good position without complicating features. Right sided chest wall drainage catheters are noted. Electronically Signed   By: Marijo Sanes M.D.   On: 10/30/2019 21:42       IMPRESSION/PLAN: 1.         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 Dr. Lisbeth Renshaw discusses the pathology findings and reviews the nature of right breast disease. She would benefit from radiotherapy to reduce the risk of local recurrence. We discussed the risks, benefits, short, and long term effects of radiotherapy, and the patient is interested in proceeding. Dr. Lisbeth Renshaw discusses the delivery and logistics of radiotherapy and anticipates a course of 6 1/2 weeks of radiotherapy to the right chest wall and regional nodes. She is aware of the increases risks of toxicity during treatment with methotrexate, and we will follow her skin closely during treatment but her psoriasis is severe and requires this medication. Written consent is obtained and placed in the chart, a copy was provided to the patient. She will return tomorrow to simulate. 2.  Psoriasis Flare. She will continue using topical moisturizers and complete her methotrexate and prednisone.  3.         Contraceptive Counseling. She had an elevated quantitative Hcg a little over a week ago that was 5. We repeated this today and it was <1. She reports she has not been sexually active in more than 2 months. She was counseled on using condoms during radiotherapy to avoid  pregnancy and the risks associated during radiotherapy.  4. Compliance. The patient no showed two appointments already. She was late getting here and lives more than an hour away. We have previously offered community referral to [place] for convenience but she is still adamant about keeping her care in [place]. She has already been contacted by our transportation department but she has not returned their calls either. Hopefully she can attend her appointments when we begin therapy but she is counseled on the importance of consecutive treatment dates and the risks of skipping or delaying treatment.   In a visit lasting 60 minutes, greater than 50% of the time was spent face to face discussing her case, and coordinating the patient's care.   The above documentation reflects my direct findings during this shared patient visit. Please see the separate note by Dr. Lisbeth Renshaw on this date for the remainder of the patient's plan of care.    Carola Rhine, PAC

## 2019-11-19 ENCOUNTER — Telehealth: Payer: Self-pay | Admitting: Oncology

## 2019-11-19 ENCOUNTER — Ambulatory Visit: Payer: Medicaid Other | Admitting: Physical Therapy

## 2019-11-19 NOTE — Telephone Encounter (Signed)
I left a message regarding schedule  

## 2019-11-20 NOTE — Progress Notes (Signed)
[place]  [phone] [phone]    ID: Terri Estes DOB: 1986-04-27  MR#: [id number]  LJQ#:[id number]  Patient Care Team: Patient, No Pcp Per as PCP - General (General Practice) Alphonsa Overall, MD as Consulting Physician (General Surgery) , [name], MD as Consulting Physician (Oncology) Kyung Rudd, MD as Consulting Physician (Radiation Oncology) Rockwell Germany, RN as Oncology Nurse Navigator Mauro Kaufmann, RN as Oncology Nurse Navigator Donnamae Jude, MD as Consulting Physician (Obstetrics and Gynecology) Pavelock, Ralene Bathe, MD as Consulting Physician (Internal Medicine) OTHER MD: Joylene Igo for Terri Estes [facility] is [phone]   CHIEF COMPLAINT: 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  She had been on pembrolizumab and I believe this is what resulted in her very severe psoriasis flare.  She was started on methotrexate 7.5 mg weekly and received 2 doses, with very good initial results.  She is still very itchy and she still has lesions all over her body, but they are beginning to dry up and flake off.  In fact she showed me how just rubbing her belly produces shower of skin flakes.  However we cannot continue the methotrexate while she receives chemotherapy so she is starting cyclophosphamide fixed dose 1 g every 3 weeks today.  I anticipate she will receive this x2 and then go back to methotrexate until the psoriasis is completely controlled  She also continues on goserelin, with her most recent dose given on 11/09/2019.   REVIEW OF SYSTEMS: Terri Estes is feeling very tired.  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  She tells me taking care of her 3 children is exhausting but she is trying to do some cooking  for Thanksgiving's.  She tells me her pain medications have been simplified and she is only taking Subutex/buprenorphine at this point.  Pain is well controlled.  She is not constipated.  She denies unusual headaches, visual changes, nausea, or vomiting.  There has been no cough, or fever.  A detailed review of systems was otherwise stable except as noted above.  HISTORY OF CURRENT ILLNESS: From the original intake note:  Terri Estes presented with swelling along the left side of the face with neck pain. She then underwent a neck CT on 04/11/2019 showing: Enlarged left level 2 lymph nodes with associated inflammatory change including stranding of the adjacent fat and thickening of the platysma. While malignancy is not excluded, this is most concerning for infection. No focal abscess is present. Hypoattenuation within 1 of the left level 2 lymph nodes likely represents central necrosis or suppuration of the node. No primary malignancy or other abscess. Left upper lobe pulmonary nodules may be inflammatory. The largest measures 0.7 x 0.7 x 0.6 cm. CT of the chest with contrast may be useful for further evaluation.  She also underwent a chest CT on the same day showing: Complex right breast mass measuring 5.6 x 4.5 x 4.7 cm consistent with a primary breast carcinoma. Malignant right axillary adenopathy measuring up to 3.1 cm. Multiple bilateral pulmonary nodules are concerning for metastatic disease. Given the right breast mass, the left cervical lymph nodes are more likely malignant.  She then underwent bilateral diagnostic mammography with tomography and right breast ultrasonography at [facility] on 04/19/2019 showing: Breast Density Category C; findings which are highly suspicious for  multicentric inflammatory right breast cancer with right axillary nodal metastatic disease; no mammographic evidence of malignancy involving the left breast.  Accordingly on 04/19/2019 she proceeded to biopsy of  the right breast area in question. The pathology from this procedure showed ([id number]): invasive ductal carcinoma, grade III, upper inner quadrant, 12:30 o'clock, 5.0 cm from the nipple. Prognostic indicators significant for: estrogen receptor, 0% negative and progesterone receptor, 0% negative. Proliferation marker Ki67 at 90%. HER2 negative (0+).  Additional biopsies of the right breast and right axilla were performed on the same day. The pathology from this procedure showed ([id number]): 2. Breast, right, needle core biopsy, satellite mass UOQ, 10 o'clock, 8cmfn  - Invasive ductal carcinoma, grade III 3. Lymph node, needle/core biopsy, level 1 right axilla   - Invasive ductal carcinoma, grade III  The patient's subsequent history is as detailed below.   PAST MEDICAL HISTORY: Past Medical History:  Diagnosis Date  . Anemia    after pregnancy  . Anxiety    severe not taking meds  . Asthma    inhaler  4x per day  . Chronic pain   . Depression   . Family history of breast cancer   . GERD (gastroesophageal reflux disease)   . History of substance abuse ([place])   . Pregnancy induced hypertension    takes medication now.  . Psoriasis    all over body  . Trichomonas infection   . Vaginal Pap smear, abnormal     PAST SURGICAL HISTORY: Past Surgical History:  Procedure Laterality Date  . CESAREAN SECTION  05/26/2008  . CESAREAN SECTION N/A 07/10/2017   Procedure: CESAREAN SECTION;  Surgeon: Florian Buff, MD;  Location: [facility];  Service: Obstetrics;  Laterality: N/A;  . CESAREAN SECTION N/A 09/23/2018   Procedure: CESAREAN SECTION;  Surgeon: Donnamae Jude, MD;  Location: [facility];  Service: Obstetrics;  Laterality: N/A;  . COLPOSCOPY    . MASTECTOMY WITH AXILLARY LYMPH NODE DISSECTION Right 10/16/2019   Procedure: RIGHT MASTECTOMY WITH AXILLARY LYMPH NODE DISSECTION;  Surgeon: Alphonsa Overall, MD;  Location: [facility];  Service: General;  Laterality: Right;  .  PORTACATH PLACEMENT Left 04/30/2019   Procedure: INSERTION PORT-A-CATH WITH ULTRASOUND;  Surgeon: Alphonsa Overall, MD;  Location: [facility];  Service: General;  Laterality: Left;  Left Subclavian vein  . WISDOM TOOTH EXTRACTION      FAMILY HISTORY: Family History  Problem Relation Age of Onset  . Kidney disease Maternal Grandmother   . Breast cancer Maternal Grandmother 28       metastatic to liver; d. 30  . Kidney disease Maternal Grandfather   . Kidney disease Paternal Grandmother   . Diabetes Paternal Grandmother   . Breast cancer Paternal Grandmother   . Kidney disease Paternal Grandfather   . Breast cancer Paternal Aunt   . Cerebral palsy Child   . Breast cancer Paternal Great-grandmother   . Pancreatic cancer Other        PGMs brother   . Throat cancer Paternal Uncle   . Breast cancer Other        PGMs sister  (Updated 04/25/2019) Evanne's father is living at age 56. Patients' mother is also living at age 71. [place] notes, however, that she was raised by her grandmother.) The patient has 0 brothers and 5 sisters. Patient denies anyone in her family having ovarian, prostate, or pancreatic cancer. Her maternal grandmother was diagnosed with breast cancer, unsure what age. On her father's side, she reports her  grandmother had cysts in her breasts, her great-aunt might have had breast cancer, and her great-grandmother was diagnosed with breast cancer at an older age.   GYNECOLOGIC HISTORY:  No LMP recorded. (Menstrual status: Chemotherapy).  She had an emergent C-section in 09/23/2018 for abruptio placenta at 30 weeks pregnancy, also has a history of eclampsia Menarche:    years old Age at first live birth: 33 years old [place] P: 3 LMP: 04/18/2019 Contraceptive: On goserelin Hysterectomy?: no BSO?: no   SOCIAL HISTORY: (Current as of August 2020) Terri Estes is currently unemployed. She previously worked at [employer]. She is engaged. Fiance [name] runs a Midwife  and works other odd jobs.  The patient recently moved to [place].  Daughter Demetrius Charity, age 91, was abused by her father at 72 months and has cerebral palsy. Son [name], age 92, has severe asthma and is developmentally disabled. Daughter Yetta Glassman was born on 09/23/2018 prematurely and remains on oxygen at home.  The patient's aunt has been staying with her and is helping with the children and providing general support   ADVANCED DIRECTIVES: not in place.    HEALTH MAINTENANCE: Social History   Tobacco Use  . Smoking status: Current Every Day Smoker    Packs/day: 0.10    Years: 0.00    Pack years: 0.00    Types: Cigarettes  . Smokeless tobacco: Never Used  . Tobacco comment: trying to quit - smokes 2 cigarettes/day  Substance Use Topics  . Alcohol use: No  . Drug use: Yes    Frequency: 7.0 times per week    Types: Marijuana    Comment: subutex last dose 10/15/19, last dose Sun 10/14/19    Colonoscopy: n/a  PAP: 05/10/2018  Bone density: never done Mammography: first performed following abnormal CT  No Known Allergies   Current Outpatient Medications  Medication Sig Dispense Refill  . acyclovir ointment (ZOVIRAX) 5 % Apply 1 application topically 2 (two) times daily as needed. Mix 1 part acyclovir cream with 1 part over the counter hydrocortisone 1% cream and apply to affected area two times a day as needed 5 g 0  . albuterol (PROVENTIL HFA;VENTOLIN HFA) 108 (90 Base) MCG/ACT inhaler Inhale 2 puffs into the lungs 4 (four) times daily. 1 Inhaler 2  . buprenorphine (SUBUTEX) 8 MG SUBL SL tablet Place 8 mg under the tongue 2 (two) times daily.     . cholestyramine (QUESTRAN) 4 g packet Take 1 packet (4 g total) by mouth 3 (three) times daily with meals. 60 each 12  . cyclobenzaprine (FLEXERIL) 10 MG tablet Take 1 tablet (10 mg total) by mouth 3 (three) times daily as needed for muscle spasms. 90 tablet 2  . enalapril (VASOTEC) 5 MG tablet Take 1 tablet (5 mg total) by mouth daily. (Patient  not taking: Reported on 11/15/2019) 90 tablet 4  . folic acid (FOLVITE) 1 MG tablet Take 1 tablet (1 mg total) by mouth daily. 60 tablet 3  . gabapentin (NEURONTIN) 300 MG capsule TAKE 1 CAPSULE(300 MG) BY MOUTH THREE TIMES DAILY (Patient taking differently: Take 300 mg by mouth 3 (three) times daily. ) 90 capsule 0  . HYDROcodone-acetaminophen (NORCO/VICODIN) 5-325 MG tablet Take 1 tablet by mouth every 6 (six) hours as needed for moderate pain. (Patient not taking: Reported on 11/15/2019) 15 tablet 0  . hydrocortisone (ANUSOL-HC) 2.5 % rectal cream Place 1 application rectally 2 (two) times daily. 60 g 0  . ketoconazole (NIZORAL) 2 % cream Apply 1 application  topically daily. 15 g 0  . methotrexate (RHEUMATREX) 7.5 MG tablet Take 2 tablets (15 mg total) by mouth once a week. Caution" Chemotherapy. Protect from light. 4 tablet 0  . omeprazole (PRILOSEC) 40 MG capsule Take 1 capsule (40 mg total) by mouth daily. 30 capsule 5  . ondansetron (ZOFRAN) 8 MG tablet TAKE 1 TABLET(8 MG) BY MOUTH EVERY 8 HOURS AS NEEDED FOR NAUSEA OR VOMITING (Patient taking differently: Take 8 mg by mouth every 8 (eight) hours as needed for nausea or vomiting. ) 20 tablet 0  . ondansetron (ZOFRAN) 8 MG tablet Take 1 tablet (8 mg total) by mouth every 8 (eight) hours as needed for nausea or vomiting. 20 tablet 0  . oxyCODONE-acetaminophen (PERCOCET/ROXICET) 5-325 MG tablet Take 1-2 tablets by mouth every 6 (six) hours as needed for severe pain. (Patient not taking: Reported on 11/15/2019) 10 tablet 0  . PARoxetine (PAXIL) 10 MG tablet Take 1 tablet (10 mg total) by mouth daily. (Patient not taking: Reported on 11/15/2019) 30 tablet 5  . predniSONE (DELTASONE) 5 MG tablet Take 6 tablets today, 5 tablets the next day, 4 tablets the next day, 3 tablets the next day, 2 tabl;ets the next day and one tablet the last day (Patient not taking: Reported on 11/15/2019) 21 tablet 0  . Prenatal MV-Min-FA-Omega-3 (PRENATAL GUMMIES/DHA & FA)  0.4-32.5 MG CHEW Chew 2 each by mouth daily.    . valACYclovir (VALTREX) 1000 MG tablet Take 1 tablet (1,000 mg total) by mouth 2 (two) times daily. 60 tablet 0   No current facility-administered medications for this visit.    Facility-Administered Medications Ordered in Other Visits  Medication Dose Route Frequency Provider Last Rate Last Dose  . 0.9 % NaCl with KCl 20 mEq/ L  infusion   Intravenous Once , [name], MD      . sodium chloride flush (NS) 0.9 % injection 10 mL  10 mL Intracatheter PRN , [name], MD        OBJECTIVE: Young African-American woman who appears stated age  Vitals:   11/21/19 1349  BP: 109/75  Pulse: 92  Resp: 17  Temp: 98 F (36.7 C)  SpO2: 100%   Wt Readings from Last 3 Encounters:  11/21/19 175 lb 8 oz (79.6 kg)  11/16/19 176 lb 8 oz (80.1 kg)  11/15/19 175 lb 12.8 oz (79.7 kg)   Body mass index is 29.2 kg/m.    ECOG FS:1 - Symptomatic but completely ambulatory  Sclerae unicteric, EOMs intact Wearing a mask No cervical or supraclavicular adenopathy Lungs no rales or rhonchi Heart regular rate and rhythm Abd soft, nontender, positive bowel sounds Neuro: nonfocal, well oriented, appropriate affect Breasts: Deferred Skin: The innumerable psoriatic lesions are slightly flatter, less erythematous, and many are beginning to flake off  Skin 11/09/2019      LAB RESULTS:  CMP     Component Value Date/Time   NA 142 11/15/2019 1420   NA 137 05/10/2018 1535   K 3.3 (L) 11/15/2019 1420   CL 102 11/15/2019 1420   CO2 25 11/15/2019 1420   GLUCOSE 120 (H) 11/15/2019 1420   BUN 9 11/15/2019 1420   BUN 7 05/10/2018 1535   CREATININE 0.93 11/15/2019 1420   CREATININE 0.81 07/03/2019 0919   CALCIUM 9.5 11/15/2019 1420   PROT 7.5 11/15/2019 1420   PROT 7.2 05/10/2018 1535   ALBUMIN 4.3 11/15/2019 1420   ALBUMIN 4.2 05/10/2018 1535   AST 13 (L) 11/15/2019 1420  AST 54 (H) 07/03/2019 0919   ALT 12 11/15/2019 1420   ALT 102  (H) 07/03/2019 0919   ALKPHOS 68 11/15/2019 1420   BILITOT 0.3 11/15/2019 1420   BILITOT <0.2 (L) 07/03/2019 0919   GFRNONAA >60 11/15/2019 1420   GFRNONAA >60 07/03/2019 0919   GFRAA >60 11/15/2019 1420   GFRAA >60 07/03/2019 0919    No results found for: TOTALPROTELP, ALBUMINELP, A1GS, A2GS, BETS, BETA2SER, GAMS, MSPIKE, SPEI  No results found for: KPAFRELGTCHN, LAMBDASER, KAPLAMBRATIO  Lab Results  Component Value Date   WBC 6.5 11/15/2019   NEUTROABS 4.8 11/15/2019   HGB 11.7 (L) 11/15/2019   HCT 36.4 11/15/2019   MCV 96.3 11/15/2019   PLT 349 11/15/2019    No results found for: LABCA2  No components found for: [id number]  No results for input(s): INR in the last 168 hours.  No results found for: LABCA2  No results found for: WLN989  No results found for: QJJ941  No results found for: DEY814  No results found for: CA2729  No components found for: HGQUANT  No results found for: CEA1 / No results found for: CEA1   No results found for: AFPTUMOR  No results found for: CHROMOGRNA  No results found for: PSA1  No visits with results within 3 Day(s) from this visit.  Latest known visit with results is:  Orders Only on 11/15/2019  Component Date Value Ref Range Status  . hCG, Beta Chain, Quant, S 11/15/2019 <1  <5 mIU/mL Final   Comment:          GEST. AGE      CONC.  (mIU/mL)   <=1 WEEK        5 - 50     2 WEEKS       50 - 500     3 WEEKS       100 - 10,000     4 WEEKS     1,000 - 30,000     5 WEEKS     3,500 - 115,000   6-8 WEEKS     12,000 - 270,000    12 WEEKS     15,000 - 220,000        FEMALE AND NON-PREGNANT FEMALE:     LESS THAN 5 mIU/mL Performed at [facility], [address] [place], [place] [address]     (this displays the last labs from the last 3 days)  No results found for: TOTALPROTELP, ALBUMINELP, A1GS, A2GS, BETS, BETA2SER, GAMS, MSPIKE, SPEI (this displays SPEP labs)  No results found for: KPAFRELGTCHN,  LAMBDASER, KAPLAMBRATIO (kappa/lambda light chains)  Lab Results  Component Value Date   HGBA 97.4 12/16/2016   (Hemoglobinopathy evaluation)   No results found for: LDH  No results found for: IRON, TIBC, IRONPCTSAT (Iron and TIBC)  Lab Results  Component Value Date   FERRITIN 900 (H) 09/12/2019    Urinalysis    Component Value Date/Time   COLORURINE YELLOW 06/26/2019 1300   APPEARANCEUR HAZY (A) 06/26/2019 1300   LABSPEC 1.019 06/26/2019 1300   PHURINE 6.0 06/26/2019 1300   GLUCOSEU NEGATIVE 06/26/2019 1300   HGBUR NEGATIVE 06/26/2019 1300   BILIRUBINUR NEGATIVE 06/26/2019 1300   KETONESUR NEGATIVE 06/26/2019 1300   PROTEINUR NEGATIVE 06/26/2019 1300   UROBILINOGEN 4.0 (H) 01/13/2018 1417   NITRITE NEGATIVE 06/26/2019 1300   LEUKOCYTESUR LARGE (A) 06/26/2019 1300     STUDIES:  Dg Chest 2 View  Result Date: 10/30/2019 CLINICAL DATA:  Right  breast pain and swelling for 6 days. Drainage catheters in the right breast. History of right breast mastectomy 10/16/2019. EXAM: CHEST - 2 VIEW COMPARISON:  04/30/2019 FINDINGS: The left subclavian power port is in good position. No complicating features. The heart is normal in size. The mediastinal and hilar contours are stable. The lungs are clear.  No pleural effusion. Two right-sided drainage catheters are noted in the right breast area. IMPRESSION: No acute cardiopulmonary findings or worrisome pulmonary lesions. Port-A-Cath in good position without complicating features. Right sided chest wall drainage catheters are noted. Electronically Signed   By: Marijo Sanes M.D.   On: 10/30/2019 21:42     ELIGIBLE FOR AVAILABLE RESEARCH PROTOCOL: no   ASSESSMENT: 33 y.o. [place] woman status post right breast biopsy x2 and right axillary lymph node biopsy 04/19/2019 for a clinical T4 N2 MX, stage IIIC invasive ductal carcinoma, grade 3, triple negative, with an MIB-1 of 90%  (a) chest CT scan 04/11/2019 shows multiple subcentimeter  lung nodules  (b) PET scan denied by insurance  (c) CT A/P and bone scan due 05/09/2019 show a very small low-density lesion in the lateral segment of the left liver which is indeterminate, and sclerosis of the right SI joint, which is felt to be likely degenerative; bone scan was entirely negative.  (c) baseline breast MRI 04/30/2019 shows involvement of all 4 quadrants in the right breast as well as right axillary subpectoral and internal mammary lymphadenopathy.  Left breast was unremarkable  (1) neoadjuvant chemotherapy consisting of  (a) pembrolizumab started 05/03/2019, third dose delayed because of elevated liver function tests, resumed 08/02/2019  (b) paclitaxel weekly x12 with carboplatin every 21 days x 4, started 05/08/2019   (i) tolerated carboplatin AUC 5-day 1 poorly, switched to AUC 2 on day 1 day 8   (ii) cycle 2 delayed 1 week because of elevated liver function tests   (iii) carboplatin and paclitaxel discontinued after 07/03/2019 because of neuropathy, (received 7 weekly doses paclitaxel and one AUC5 carbo dose, four AUC2 doses)  (c) cyclophosphamide and doxorubicin x4 started 07/03/2019, completed 08/29/2019   (i) 2d dose given day 28 due to poor tolerance; other doses Q 21 days   (2) s/p right mastectomy and ALND 10/16/2019 showing a residual ypT2 ypN2 invasive ductal carcinoma, grade 3, with negative margins  (a) a total of 10 lymph nodes were removed, 4 with macrometastases, 1 with a micrometastasis  (3) adjuvant radiation to follow, consider capecitabine sensitization  (4) genetics testing  (a) genetic testing 05/14/2019 through the Common Hereditary Gene Panel offered by [facility] found no deleterious mutations in APC, ATM, AXIN2, BARD1, BMPR1A, BRCA1, BRCA2, BRIP1, CDH1, CDK4, CDKN2A (p14ARF), CDKN2A (p16INK4a), CHEK2, CTNNA1, DICER1, EPCAM (Deletion/duplication testing only), GREM1 (promoter region deletion/duplication testing only), KIT, MEN1, MLH1, MSH2, MSH3, MSH6,  MUTYH, NBN, NF1, NHTL1, PALB2, PDGFRA, PMS2, POLD1, POLE, PTEN, RAD50, RAD51C, RAD51D, RNF43, SDHB, SDHC, SDHD, SMAD4, SMARCA4. STK11, TP53, TSC1, TSC2, and VHL.  The following genes were evaluated for sequence changes only: SDHA and HOXB13 c.251G>A variant only  (5) goserelin started 05/08/2019  (6) pembrolizumab started 08/02/2019, stopped after fourth dose (10/02/2019) with psoriasis flare  (7) psoriasis:  (a) weekly methotrexate started 11/10/2019, held after 11/17/2019 dose so as not to overlap with radiation  (b) intravenous cyclophosphamide 1 g every 3 weeks starting 11/21/2019     PLAN: Terri Estes did well with the methotrexate and had no side effects from it that she was aware of.  She continues on folate daily.  The psoriatic lesions are responding very nicely although she is still quite troubled by itching.  The skin lesions are just beginning to flake off.  However we cannot continue the methotrexate during radiation.  Accordingly she is receiving cyclophosphamide today.  She has a good understanding of the possible toxicities side effects and complications of this agent.  It can be continued right through radiation.  Once she completes her radiation treatments she will go back on the oral methotrexate until the psoriasis is controlled  We will not be able to continue pembrolizumab and that has been discontinued.  She will receive her next goserelin dose on December 10 and her next cyclophosphamide dose on December 17.  She will likely complete her radiation treatments in early January.  I will see her again 12/13/2019 but she knows to call for any other issue that may develop before then.    , [name], MD  11/22/19 9:28 AM Medical Oncology and Hematology [facility] [place], [place] [address] Tel. [phone]    Fax. [phone]   I, Wilburn Mylar, am acting as scribe for Dr. [name]. .  I, Lurline Del MD, have  reviewed the above documentation for accuracy and completeness, and I agree with the above.

## 2019-11-21 ENCOUNTER — Inpatient Hospital Stay: Payer: Medicaid Other

## 2019-11-21 ENCOUNTER — Other Ambulatory Visit: Payer: Self-pay

## 2019-11-21 ENCOUNTER — Inpatient Hospital Stay (HOSPITAL_BASED_OUTPATIENT_CLINIC_OR_DEPARTMENT_OTHER): Payer: Medicaid Other | Admitting: Oncology

## 2019-11-21 ENCOUNTER — Other Ambulatory Visit: Payer: Medicaid Other

## 2019-11-21 ENCOUNTER — Telehealth: Payer: Self-pay | Admitting: *Deleted

## 2019-11-21 ENCOUNTER — Other Ambulatory Visit: Payer: Self-pay | Admitting: *Deleted

## 2019-11-21 VITALS — BP 109/75 | HR 92 | Temp 98.0°F | Resp 17 | Ht 65.0 in | Wt 175.5 lb

## 2019-11-21 DIAGNOSIS — Z658 Other specified problems related to psychosocial circumstances: Secondary | ICD-10-CM | POA: Diagnosis not present

## 2019-11-21 DIAGNOSIS — C50811 Malignant neoplasm of overlapping sites of right female breast: Secondary | ICD-10-CM

## 2019-11-21 DIAGNOSIS — L408 Other psoriasis: Secondary | ICD-10-CM

## 2019-11-21 DIAGNOSIS — Z171 Estrogen receptor negative status [ER-]: Secondary | ICD-10-CM

## 2019-11-21 DIAGNOSIS — Z5111 Encounter for antineoplastic chemotherapy: Secondary | ICD-10-CM | POA: Diagnosis not present

## 2019-11-21 MED ORDER — PROCHLORPERAZINE EDISYLATE 10 MG/2ML IJ SOLN
INTRAMUSCULAR | Status: AC
  Filled 2019-11-21: qty 2

## 2019-11-21 MED ORDER — HEPARIN SOD (PORK) LOCK FLUSH 100 UNIT/ML IV SOLN
500.0000 [IU] | Freq: Once | INTRAVENOUS | Status: AC
  Administered 2019-11-21: 500 [IU] via INTRAVENOUS
  Filled 2019-11-21: qty 5

## 2019-11-21 MED ORDER — ONDANSETRON HCL 8 MG PO TABS: 8.0000 mg | ORAL_TABLET | Freq: Three times a day (TID) | ORAL | 0 refills | Status: AC | PRN

## 2019-11-21 MED ORDER — SODIUM CHLORIDE 0.9 % IV SOLN
1000.0000 mg | Freq: Once | INTRAVENOUS | Status: AC
  Administered 2019-11-21: 1000 mg via INTRAVENOUS
  Filled 2019-11-21: qty 50

## 2019-11-21 MED ORDER — PROCHLORPERAZINE EDISYLATE 10 MG/2ML IJ SOLN
10.0000 mg | Freq: Once | INTRAMUSCULAR | Status: AC
  Administered 2019-11-21: 10 mg via INTRAVENOUS

## 2019-11-21 MED ORDER — SODIUM CHLORIDE 0.9% FLUSH
10.0000 mL | Freq: Once | INTRAVENOUS | Status: AC | PRN
  Administered 2019-11-21: 10 mL via INTRAVENOUS
  Filled 2019-11-21: qty 10

## 2019-11-21 MED ORDER — SODIUM CHLORIDE 0.9 % IV SOLN
INTRAVENOUS | Status: DC
  Administered 2019-11-21: 14:00:00 via INTRAVENOUS
  Filled 2019-11-21: qty 250

## 2019-11-21 NOTE — Progress Notes (Signed)
MD would like to give Cytoxan 1gm flat dose for Psoriasis flare and patient is unable to take MTX during XRT.  He will repeat this dose in 3 weeks.  Include Compazine 10mg  IV as antiemetics per Dr. Jana Hakim.  Orders added as discussed.  Hardie Pulley, PharmD, BCPS, BCOP

## 2019-11-21 NOTE — Telephone Encounter (Signed)
Per MD- use labs for treatment today from 11/15/2019.

## 2019-11-21 NOTE — Patient Instructions (Addendum)
[facility] Discharge Instructions for Patients Receiving Chemotherapy  Today you received the following chemotherapy agents: cyclophosphamide  To help prevent nausea and vomiting after your treatment, we encourage you to take your nausea medication as directed.    If you develop nausea and vomiting that is not controlled by your nausea medication, call the clinic.   BELOW ARE SYMPTOMS THAT SHOULD BE REPORTED IMMEDIATELY:  *FEVER GREATER THAN 100.5 F  *CHILLS WITH OR WITHOUT FEVER  NAUSEA AND VOMITING THAT IS NOT CONTROLLED WITH YOUR NAUSEA MEDICATION  *UNUSUAL SHORTNESS OF BREATH  *UNUSUAL BRUISING OR BLEEDING  TENDERNESS IN MOUTH AND THROAT WITH OR WITHOUT PRESENCE OF ULCERS  *URINARY PROBLEMS  *BOWEL PROBLEMS  UNUSUAL RASH Items with * indicate a potential emergency and should be followed up as soon as possible.  Feel free to call the clinic should you have any questions or concerns. The clinic phone number is [phone].  Please show the [facility] at check-in to the Emergency Department and triage nurse.  Cyclophosphamide injection What is this medicine? CYCLOPHOSPHAMIDE (sye kloe FOSS fa mide) is a chemotherapy drug. It slows the growth of cancer cells. This medicine is used to treat many types of cancer like lymphoma, myeloma, leukemia, breast cancer, and ovarian cancer, to name a few. This medicine may be used for other purposes; ask your health care provider or pharmacist if you have questions. COMMON BRAND NAME(S): Cytoxan, Neosar What should I tell my health care provider before I take this medicine? They need to know if you have any of these conditions:  blood disorders  history of other chemotherapy  infection  kidney disease  liver disease  recent or ongoing radiation therapy  tumors in the bone marrow  an unusual or allergic reaction to cyclophosphamide, other chemotherapy, other medicines, foods, dyes, or  preservatives  pregnant or trying to get pregnant  breast-feeding How should I use this medicine? This drug is usually given as an injection into a vein or muscle or by infusion into a vein. It is administered in a hospital or clinic by a specially trained health care professional. Talk to your pediatrician regarding the use of this medicine in children. Special care may be needed. Overdosage: If you think you have taken too much of this medicine contact a poison control center or emergency room at once. NOTE: This medicine is only for you. Do not share this medicine with others. What if I miss a dose? It is important not to miss your dose. Call your doctor or health care professional if you are unable to keep an appointment. What may interact with this medicine? This medicine may interact with the following medications:  amiodarone  amphotericin B  azathioprine  certain antiviral medicines for HIV or AIDS such as protease inhibitors (e.g., indinavir, ritonavir) and zidovudine  certain blood pressure medications such as benazepril, captopril, enalapril, fosinopril, lisinopril, moexipril, monopril, perindopril, quinapril, ramipril, trandolapril  certain cancer medications such as anthracyclines (e.g., daunorubicin, doxorubicin), busulfan, cytarabine, paclitaxel, pentostatin, tamoxifen, trastuzumab  certain diuretics such as chlorothiazide, chlorthalidone, hydrochlorothiazide, indapamide, metolazone  certain medicines that treat or prevent blood clots like warfarin  certain muscle relaxants such as succinylcholine  cyclosporine  etanercept  indomethacin  medicines to increase blood counts like filgrastim, pegfilgrastim, sargramostim  medicines used as general anesthesia  metronidazole  natalizumab This list may not describe all possible interactions. Give your health care provider a list of all the medicines, herbs, non-prescription drugs, or dietary supplements you  use.  Also tell them if you smoke, drink alcohol, or use illegal drugs. Some items may interact with your medicine. What should I watch for while using this medicine? Visit your doctor for checks on your progress. This drug may make you feel generally unwell. This is not uncommon, as chemotherapy can affect healthy cells as well as cancer cells. Report any side effects. Continue your course of treatment even though you feel ill unless your doctor tells you to stop. Drink water or other fluids as directed. Urinate often, even at night. In some cases, you may be given additional medicines to help with side effects. Follow all directions for their use. Call your doctor or health care professional for advice if you get a fever, chills or sore throat, or other symptoms of a cold or flu. Do not treat yourself. This drug decreases your body's ability to fight infections. Try to avoid being around people who are sick. This medicine may increase your risk to bruise or bleed. Call your doctor or health care professional if you notice any unusual bleeding. Be careful brushing and flossing your teeth or using a toothpick because you may get an infection or bleed more easily. If you have any dental work done, tell your dentist you are receiving this medicine. You may get drowsy or dizzy. Do not drive, use machinery, or do anything that needs mental alertness until you know how this medicine affects you. Do not become pregnant while taking this medicine or for 1 year after stopping it. Women should inform their doctor if they wish to become pregnant or think they might be pregnant. Men should not father a child while taking this medicine and for 4 months after stopping it. There is a potential for serious side effects to an unborn child. Talk to your health care professional or pharmacist for more information. Do not breast-feed an infant while taking this medicine. This medicine may interfere with the ability to have a  child. This medicine has caused ovarian failure in some women. This medicine has caused reduced sperm counts in some men. You should talk with your doctor or health care professional if you are concerned about your fertility. If you are going to have surgery, tell your doctor or health care professional that you have taken this medicine. What side effects may I notice from receiving this medicine? Side effects that you should report to your doctor or health care professional as soon as possible:  allergic reactions like skin rash, itching or hives, swelling of the face, lips, or tongue  low blood counts - this medicine may decrease the number of white blood cells, red blood cells and platelets. You may be at increased risk for infections and bleeding.  signs of infection - fever or chills, cough, sore throat, pain or difficulty passing urine  signs of decreased platelets or bleeding - bruising, pinpoint red spots on the skin, black, tarry stools, blood in the urine  signs of decreased red blood cells - unusually weak or tired, fainting spells, lightheadedness  breathing problems  dark urine  dizziness  palpitations  swelling of the ankles, feet, hands  trouble passing urine or change in the amount of urine  weight gain  yellowing of the eyes or skin Side effects that usually do not require medical attention (report to your doctor or health care professional if they continue or are bothersome):  changes in nail or skin color  hair loss  missed menstrual periods  mouth sores  nausea, vomiting This list may not describe all possible side effects. Call your doctor for medical advice about side effects. You may report side effects to FDA at 1-800-FDA-1088. Where should I keep my medicine? This drug is given in a hospital or clinic and will not be stored at home. NOTE: This sheet is a summary. It may not cover all possible information. If you have questions about this medicine,  talk to your doctor, pharmacist, or health care provider.  2020 Elsevier/Gold Standard (2012-10-27 16:22:58)

## 2019-11-22 NOTE — Addendum Note (Signed)
Addended by: Chauncey Cruel on: 11/22/2019 09:38 AM   Modules accepted: Orders

## 2019-11-23 ENCOUNTER — Telehealth: Payer: Self-pay | Admitting: Oncology

## 2019-11-23 NOTE — Telephone Encounter (Signed)
RN returned call, voicemail left for return call.  

## 2019-11-23 NOTE — Telephone Encounter (Signed)
I talk with patient regarding schedule  

## 2019-11-26 ENCOUNTER — Ambulatory Visit: Payer: Medicaid Other | Admitting: Radiation Oncology

## 2019-11-27 ENCOUNTER — Inpatient Hospital Stay: Payer: Medicaid Other

## 2019-11-29 ENCOUNTER — Encounter (HOSPITAL_COMMUNITY): Payer: Self-pay | Admitting: Emergency Medicine

## 2019-11-29 ENCOUNTER — Other Ambulatory Visit: Payer: Self-pay

## 2019-11-29 ENCOUNTER — Emergency Department (HOSPITAL_COMMUNITY)
Admission: EM | Admit: 2019-11-29 | Discharge: 2019-11-29 | Disposition: A | Discharge status: Home / Self Care | Payer: Medicaid Other | Attending: Emergency Medicine | Admitting: Emergency Medicine

## 2019-11-29 DIAGNOSIS — R197 Diarrhea, unspecified: Secondary | ICD-10-CM

## 2019-11-29 DIAGNOSIS — R112 Nausea with vomiting, unspecified: Secondary | ICD-10-CM | POA: Diagnosis present

## 2019-11-29 DIAGNOSIS — F17210 Nicotine dependence, cigarettes, uncomplicated: Secondary | ICD-10-CM | POA: Diagnosis not present

## 2019-11-29 DIAGNOSIS — R10817 Generalized abdominal tenderness: Secondary | ICD-10-CM | POA: Diagnosis not present

## 2019-11-29 DIAGNOSIS — Z8709 Personal history of other diseases of the respiratory system: Secondary | ICD-10-CM | POA: Insufficient documentation

## 2019-11-29 DIAGNOSIS — F1290 Cannabis use, unspecified, uncomplicated: Secondary | ICD-10-CM | POA: Diagnosis not present

## 2019-11-29 DIAGNOSIS — Z853 Personal history of malignant neoplasm of breast: Secondary | ICD-10-CM | POA: Insufficient documentation

## 2019-11-29 DIAGNOSIS — Z79899 Other long term (current) drug therapy: Secondary | ICD-10-CM | POA: Diagnosis not present

## 2019-11-29 HISTORY — DX: Malignant (primary) neoplasm, unspecified: C80.1

## 2019-11-29 LAB — CBC WITH DIFFERENTIAL/PLATELET
Abs Immature Granulocytes: 0.03 10*3/uL (ref 0.00–0.07)
Basophils Absolute: 0 10*3/uL (ref 0.0–0.1)
Basophils Relative: 0 %
Eosinophils Absolute: 0 10*3/uL (ref 0.0–0.5)
Eosinophils Relative: 1 %
HCT: 36.5 % (ref 36.0–46.0)
Hemoglobin: 12 g/dL (ref 12.0–15.0)
Immature Granulocytes: 0 %
Lymphocytes Relative: 7 %
Lymphs Abs: 0.5 10*3/uL — ABNORMAL LOW (ref 0.7–4.0)
MCH: 30.5 pg (ref 26.0–34.0)
MCHC: 32.9 g/dL (ref 30.0–36.0)
MCV: 92.6 fL (ref 80.0–100.0)
Monocytes Absolute: 0.3 10*3/uL (ref 0.1–1.0)
Monocytes Relative: 4 %
Neutro Abs: 6.1 10*3/uL (ref 1.7–7.7)
Neutrophils Relative %: 88 %
Platelets: 273 10*3/uL (ref 150–400)
RBC: 3.94 MIL/uL (ref 3.87–5.11)
RDW: 14.2 % (ref 11.5–15.5)
WBC: 7 10*3/uL (ref 4.0–10.5)
nRBC: 0 % (ref 0.0–0.2)

## 2019-11-29 LAB — URINALYSIS, ROUTINE W REFLEX MICROSCOPIC
Bacteria, UA: NONE SEEN
Bilirubin Urine: NEGATIVE
Glucose, UA: NEGATIVE mg/dL
Hgb urine dipstick: NEGATIVE
Ketones, ur: 5 mg/dL — AB
Nitrite: NEGATIVE
Protein, ur: NEGATIVE mg/dL
Specific Gravity, Urine: 1.02 (ref 1.005–1.030)
pH: 6 (ref 5.0–8.0)

## 2019-11-29 LAB — COMPREHENSIVE METABOLIC PANEL
ALT: 14 U/L (ref 0–44)
AST: 16 U/L (ref 15–41)
Albumin: 3.8 g/dL (ref 3.5–5.0)
Alkaline Phosphatase: 67 U/L (ref 38–126)
Anion gap: 10 (ref 5–15)
BUN: <5 mg/dL — ABNORMAL LOW (ref 6–20)
CO2: 29 mmol/L (ref 22–32)
Calcium: 9.7 mg/dL (ref 8.9–10.3)
Chloride: 104 mmol/L (ref 98–111)
Creatinine, Ser: 0.97 mg/dL (ref 0.44–1.00)
GFR calc Af Amer: >60 mL/min (ref >60)
GFR calc non Af Amer: >60 mL/min (ref >60)
Glucose, Bld: 99 mg/dL (ref 70–99)
Potassium: 3.6 mmol/L (ref 3.5–5.1)
Sodium: 143 mmol/L (ref 135–145)
Total Bilirubin: 0.6 mg/dL (ref 0.3–1.2)
Total Protein: 7 g/dL (ref 6.5–8.1)

## 2019-11-29 LAB — I-STAT BETA HCG BLOOD, ED (MC, WL, AP ONLY): I-stat hCG, quantitative: <5 m[IU]/mL (ref <5)

## 2019-11-29 MED ORDER — HEPARIN SOD (PORK) LOCK FLUSH 100 UNIT/ML IV SOLN
500.0000 [IU] | Freq: Once | INTRAVENOUS | Status: AC
  Administered 2019-11-29: 500 [IU]
  Filled 2019-11-29: qty 5

## 2019-11-29 MED ORDER — PROMETHAZINE HCL 25 MG/ML IJ SOLN
12.5000 mg | Freq: Once | INTRAMUSCULAR | Status: AC
  Administered 2019-11-29: 07:00:00 12.5 mg via INTRAVENOUS
  Filled 2019-11-29: qty 1

## 2019-11-29 MED ORDER — METOCLOPRAMIDE HCL 5 MG/ML IJ SOLN
10.0000 mg | Freq: Once | INTRAMUSCULAR | Status: AC
  Administered 2019-11-29: 09:00:00 10 mg via INTRAVENOUS
  Filled 2019-11-29: qty 2

## 2019-11-29 MED ORDER — PROMETHAZINE HCL 25 MG RE SUPP: 25.0000 mg | Freq: Four times a day (QID) | RECTAL | 0 refills | Status: DC | PRN

## 2019-11-29 MED ORDER — METOCLOPRAMIDE HCL 10 MG PO TABS: 10.0000 mg | ORAL_TABLET | Freq: Three times a day (TID) | ORAL | 0 refills | Status: AC | PRN

## 2019-11-29 MED ORDER — SODIUM CHLORIDE 0.9 % IV BOLUS
1000.0000 mL | Freq: Once | INTRAVENOUS | Status: AC
  Administered 2019-11-29: 1000 mL via INTRAVENOUS

## 2019-11-29 NOTE — ED Triage Notes (Signed)
Patient reports emesis and diarrhea this week , currently receiving chemotherapy for right breast cancer and radiation treatment next week , denies fever or chills / no cough or SOB .

## 2019-11-29 NOTE — ED Notes (Signed)
Patient verbalizes understanding of discharge instructions. Opportunity for questioning and answers were provided. Armband removed by staff, pt discharged from ED.  

## 2019-11-29 NOTE — ED Provider Notes (Signed)
  Physical Exam  BP (!) 133/111 (BP Location: Left Arm)   Pulse 65   Temp 99 F (37.2 C) (Oral)   Resp 16   Ht 5\' 5"  (1.651 m)   Wt 79.6 kg   SpO2 99%   BMI 29.20 kg/m   Physical Exam   Gen: appears nontoxic Abs: generalized discomfort. No focal tenderness or increased pain with palpation. No rigidity or distention.   ED Course/Procedures     Procedures  MDM   Patient signed out to me by Sharlene Motts, PA-C.  Please see previous notes for further history.  In brief, patient with a history of breast cancer status postmastectomy several months ago.  She was on chemotherapy from March to September.  She restarted chemotherapy 1 week ago.  For the past week, she has had nausea, vomiting, diarrhea which was also an issue when she had a chemotherapy earlier this year.  She is here for persistent vomiting and dehydration.  Labs overall reassuring.  She is diffusely tender without focal abdominal tenderness.  Plan is for symptomatic control.  If she is feeling better, she can p.o. challenge and be discharged.  On reassessment, patient reports nausea and vomiting is improved.  Requesting applesauce.  Will p.o. challenge and reassess.  Patient with increased pain and nausea after eating several bites of applesauce.  Will give Reglan and reassess.  On reassessment, patient reports nausea is improved.  She tolerated this with the applesauce without difficulty.  She does have continued generalized abdominal pain without focal pain.  Patient does not want anything for pain.  Will discharge with antinausea medicine including Phenergan suppository and Reglan per patient request.  Discussed follow-up with her doctor and strict monitoring of symptoms.  At this time, patient appears safe for discharge.  Return precautions given.  Patient states she understands and agrees to plan.         Franchot Heidelberg, PA-C 11/29/19 1003    Pattricia Boss, MD 11/30/19 [phone]

## 2019-11-29 NOTE — Discharge Instructions (Addendum)
Continue taking home medications as prescribed. Use phenergan suppository and/or the reglan pills as needed for nausea.  Follow up with your doctor as scheduled.  Return to the ER if you develop high fevers, persistent vomiting, severe worsening abdominal pain, or any new, worsening, or concerning symptoms.

## 2019-11-29 NOTE — ED Provider Notes (Signed)
[facility] EMERGENCY DEPARTMENT Provider Note   CSN: [id number] Arrival date & time: 11/29/19  0128     History   Chief Complaint Chief Complaint  Patient presents with  . Emesis / Diarrhea    Chemotherapy -(Breast Cancer)    HPI Terri CHAIRES is a 33 y.o. female.     Patient to ED with nausea, vomiting and diarrhea for one week.  No fever, hematemesis or bloody stoles. She complains of diffuse abdominal pain. No cough, chest pain, congestion. She reports she continues to urinate without dysuria or frequency. She has a history of breast CA, currently receiving chemotherpy which was restarted one week ago after a 3 month hiatus. She reports having similar symptoms with previous chemo treatment.   The history is provided by the patient. No language interpreter was used.    Past Medical History:  Diagnosis Date  . Anemia    after pregnancy  . Anxiety    severe not taking meds  . Asthma    inhaler  4x per day  . Cancer ([place])   . Chronic pain   . Depression   . Family history of breast cancer   . GERD (gastroesophageal reflux disease)   . History of substance abuse ([place])   . Pregnancy induced hypertension    takes medication now.  . Psoriasis    all over body  . Trichomonas infection   . Vaginal Pap smear, abnormal     Patient Active Problem List   Diagnosis Date Noted  . 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 Encounter for antineoplastic immunotherapy 08/22/2019  . 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 Genetic testing 05/14/2019  . Family history of breast cancer   . Malignant neoplasm of overlapping sites of right breast in female, estrogen receptor negative ([place]) 04/25/2019  . Acute kidney injury ([place]) 09/29/2018  . 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 Acute blood loss anemia 09/23/2018  . DIC (disseminated intravascular coagulation) ([place]) 09/23/2018  . S/P cesarean section 09/23/2018  .  Inadequate social support 07/21/2018  . Sciatica 07/21/2018  . Abdominal muscle strain, initial encounter 07/21/2018  . History of substance abuse ([place]) 06/12/2018  . Supervision of other normal pregnancy, antepartum 05/10/2018  . Short interval between pregnancies affecting pregnancy, antepartum 05/10/2018  . Hx of preeclampsia, prior pregnancy, currently pregnant 05/10/2018  . History of preterm delivery, currently pregnant 05/10/2018  . Depression affecting pregnancy, antepartum 05/10/2018  . HSIL (high grade squamous intraepithelial lesion) on Pap smear of cervix 12/24/2016  . History of cesarean delivery 12/16/2016    Past Surgical History:  Procedure Laterality Date  . CESAREAN SECTION  05/26/2008  . CESAREAN SECTION N/A 07/10/2017   Procedure: CESAREAN SECTION;  Surgeon: Florian Buff, MD;  Location: [facility];  Service: Obstetrics;  Laterality: N/A;  . CESAREAN SECTION N/A 09/23/2018   Procedure: CESAREAN SECTION;  Surgeon: Donnamae Jude, MD;  Location: [facility];  Service: Obstetrics;  Laterality: N/A;  . COLPOSCOPY    . MASTECTOMY WITH AXILLARY LYMPH NODE DISSECTION Right 10/16/2019   Procedure: RIGHT MASTECTOMY WITH AXILLARY LYMPH NODE DISSECTION;  Surgeon: Alphonsa Overall, MD;  Location: [facility];  Service: General;  Laterality: Right;  . PORTACATH PLACEMENT Left 04/30/2019   Procedure: INSERTION PORT-A-CATH WITH ULTRASOUND;  Surgeon: Alphonsa Overall, MD;  Location: [facility];  Service: General;  Laterality: Left;  Left Subclavian vein  . WISDOM TOOTH EXTRACTION       OB History  Gravida  6   Para  3   Term  1   Preterm  2   AB  3   Living  3     SAB      TAB  3   Ectopic      Multiple  0   Live Births  3            Home Medications    Prior to Admission medications   Medication Sig Start Date End Date Taking? Authorizing Provider  acyclovir ointment (ZOVIRAX) 5 % Apply 1 application topically 2 (two) times daily as needed. Mix 1 part  acyclovir cream with 1 part over the counter hydrocortisone 1% cream and apply to affected area two times a day as needed 06/27/19   Gardenia Phlegm, NP  albuterol (PROVENTIL HFA;VENTOLIN HFA) 108 (90 Base) MCG/ACT inhaler Inhale 2 puffs into the lungs 4 (four) times daily. 09/26/18   Donnamae Jude, MD  buprenorphine (SUBUTEX) 8 MG SUBL SL tablet Place 8 mg under the tongue 2 (two) times daily.     [provider]  cholestyramine (QUESTRAN) 4 g packet Take 1 packet (4 g total) by mouth 3 (three) times daily with meals. 05/11/19   Magrinat, [name], MD  cyclobenzaprine (FLEXERIL) 10 MG tablet Take 1 tablet (10 mg total) by mouth 3 (three) times daily as needed for muscle spasms. 09/26/18   Donnamae Jude, MD  folic acid (FOLVITE) 1 MG tablet Take 1 tablet (1 mg total) by mouth daily. 11/09/19   Magrinat, [name], MD  gabapentin (NEURONTIN) 300 MG capsule TAKE 1 CAPSULE(300 MG) BY MOUTH THREE TIMES DAILY Patient taking differently: Take 300 mg by mouth 3 (three) times daily.  09/10/19   Gardenia Phlegm, NP  hydrocortisone (ANUSOL-HC) 2.5 % rectal cream Place 1 application rectally 2 (two) times daily. 07/18/19   Tanner, Lyndon Code., PA-C  ketoconazole (NIZORAL) 2 % cream Apply 1 application topically daily. 08/15/19   Magrinat, [name], MD  omeprazole (PRILOSEC) 40 MG capsule Take 1 capsule (40 mg total) by mouth daily. 05/11/19   Tanner, Lyndon Code., PA-C  ondansetron (ZOFRAN) 8 MG tablet Take 1 tablet (8 mg total) by mouth every 8 (eight) hours as needed for nausea or vomiting. 11/21/19   Magrinat, [name], MD  PARoxetine (PAXIL) 10 MG tablet Take 1 tablet (10 mg total) by mouth daily. Patient not taking: Reported on 11/15/2019 05/11/19   Harle Stanford., PA-C  Prenatal MV-Min-FA-Omega-3 (PRENATAL GUMMIES/DHA & FA) 0.4-32.5 MG CHEW Chew 2 each by mouth daily.    [provider]  valACYclovir (VALTREX) 1000 MG tablet Take 1 tablet (1,000 mg total) by mouth 2 (two) times daily.  06/26/19   Gardenia Phlegm, NP  enalapril (VASOTEC) 10 MG tablet TAKE 1 TABLET BY MOUTH DAILY Patient taking differently: Take 10 mg by mouth daily.  03/15/19   Donnamae Jude, MD    Family History Family History  Problem Relation Age of Onset  . Kidney disease Maternal Grandmother   . Breast cancer Maternal Grandmother 28       metastatic to liver; d. 30  . Kidney disease Maternal Grandfather   . Kidney disease Paternal Grandmother   . Diabetes Paternal Grandmother   . Breast cancer Paternal Grandmother   . Kidney disease Paternal Grandfather   . Breast cancer Paternal Aunt   . Cerebral palsy Child   . Breast cancer Paternal Great-grandmother   . Pancreatic cancer Other  PGMs brother   . Throat cancer Paternal Uncle   . Breast cancer Other        PGMs sister    Social History Social History   Tobacco Use  . Smoking status: Current Every Day Smoker    Packs/day: 0.10    Years: 0.00    Pack years: 0.00    Types: Cigarettes  . Smokeless tobacco: Never Used  . Tobacco comment: trying to quit - smokes 2 cigarettes/day  Substance Use Topics  . Alcohol use: No  . Drug use: Yes    Frequency: 7.0 times per week    Types: Marijuana    Comment: subutex last dose 10/15/19, last dose Sun 10/14/19     Allergies   Patient has no known allergies.   Review of Systems Review of Systems  Constitutional: Negative for chills and fever.  HENT: Negative.   Respiratory: Negative.   Cardiovascular: Negative.   Gastrointestinal: Positive for abdominal pain, diarrhea, nausea and vomiting. Negative for blood in stool.  Genitourinary: Negative.   Musculoskeletal: Negative.  Negative for myalgias.  Skin: Negative.   Neurological: Negative.  Negative for syncope and light-headedness.     Physical Exam Updated Vital Signs BP 114/84   Pulse 65   Temp 99 F (37.2 C) (Oral)   Resp 18   Ht 5\' 5"  (1.651 m)   Wt 79.6 kg   SpO2 99%   BMI 29.20 kg/m   Physical Exam  Vitals signs and nursing note reviewed.  Constitutional:      General: She is not in acute distress.    Appearance: She is well-developed. She is ill-appearing. She is not toxic-appearing.  HENT:     Head: Normocephalic.  Neck:     Musculoskeletal: Normal range of motion and neck supple.  Cardiovascular:     Rate and Rhythm: Normal rate and regular rhythm.     Heart sounds: No murmur.  Pulmonary:     Effort: Pulmonary effort is normal.     Breath sounds: Normal breath sounds. No wheezing, rhonchi or rales.  Abdominal:     General: Bowel sounds are normal.     Palpations: Abdomen is soft.     Tenderness: There is abdominal tenderness (Diffuse tenderness to soft abdomen). There is no guarding or rebound.  Musculoskeletal: Normal range of motion.  Skin:    General: Skin is warm and dry.     Comments: Widespread erythematous, round, approx 1 cm in diameter skin lesions, some with a coarse white covering, c/w psoriasis.   Neurological:     Mental Status: She is alert and oriented to person, place, and time.     Coordination: Coordination normal.      ED Treatments / Results  Labs (all labs ordered are listed, but only abnormal results are displayed) Labs Reviewed  CBC WITH DIFFERENTIAL/PLATELET - Abnormal; Notable for the following components:      Result Value   Lymphs Abs 0.5 (*)    All other components within normal limits  COMPREHENSIVE METABOLIC PANEL - Abnormal; Notable for the following components:   BUN <5 (*)    All other components within normal limits  URINALYSIS, ROUTINE W REFLEX MICROSCOPIC - Abnormal; Notable for the following components:   APPearance HAZY (*)    Ketones, ur 5 (*)    Leukocytes,Ua MODERATE (*)    All other components within normal limits  I-STAT BETA HCG BLOOD, ED ([facility], [facility], [facility] ONLY)    EKG None  Radiology No results  found.  Procedures Procedures (including critical care time)  Medications Ordered in ED Medications  sodium chloride  0.9 % bolus 1,000 mL (has no administration in time range)  promethazine (PHENERGAN) injection 12.5 mg (has no administration in time range)     Initial Impression / Assessment and Plan / ED Course  I have reviewed the triage vital signs and the nursing notes.  Pertinent labs & imaging results that were available during my care of the patient were reviewed by me and considered in my medical decision making (see chart for details).        Patient to ED with N, V, D associated with having restarted chemo one week ago. History of same.   Labs are essentially normal, without evidence to suggest acute process or infection. She will need rehydration, antiemetics. VSS.  Patient care signed out to [facility], PA-C, pending IVF's and PO challenge.   Final Clinical Impressions(s) / ED Diagnoses   Final diagnoses:  None   1. Nausea and vomiting 2. Diarrhea 3. Dehydration  ED Discharge Orders    None       Charlann Lange, Hershal Coria 11/29/19 [id number]    Merrily Pew, MD 11/29/19 [id number]

## 2019-11-30 ENCOUNTER — Telehealth: Payer: Self-pay | Admitting: Oncology

## 2019-11-30 NOTE — Telephone Encounter (Signed)
Added lab/port to 12/10 infusion. Left message. Schedule mailed. Also added comment to 12/9 radonc appointment to send patient to [facility] for updated schedule.

## 2019-12-05 ENCOUNTER — Ambulatory Visit: Payer: Medicaid Other | Admitting: Radiation Oncology

## 2019-12-06 ENCOUNTER — Inpatient Hospital Stay: Payer: Medicaid Other

## 2019-12-06 ENCOUNTER — Inpatient Hospital Stay: Payer: Medicaid Other | Attending: Oncology

## 2019-12-06 ENCOUNTER — Encounter: Payer: Self-pay | Admitting: Oncology

## 2019-12-06 ENCOUNTER — Ambulatory Visit: Payer: Medicaid Other

## 2019-12-06 DIAGNOSIS — Z6828 Body mass index (BMI) 28.0-28.9, adult: Secondary | ICD-10-CM | POA: Insufficient documentation

## 2019-12-06 DIAGNOSIS — Z79899 Other long term (current) drug therapy: Secondary | ICD-10-CM | POA: Insufficient documentation

## 2019-12-06 DIAGNOSIS — F17210 Nicotine dependence, cigarettes, uncomplicated: Secondary | ICD-10-CM | POA: Insufficient documentation

## 2019-12-06 DIAGNOSIS — C50811 Malignant neoplasm of overlapping sites of right female breast: Secondary | ICD-10-CM | POA: Insufficient documentation

## 2019-12-06 DIAGNOSIS — R7989 Other specified abnormal findings of blood chemistry: Secondary | ICD-10-CM | POA: Insufficient documentation

## 2019-12-06 DIAGNOSIS — Z800 Family history of malignant neoplasm of digestive organs: Secondary | ICD-10-CM | POA: Insufficient documentation

## 2019-12-06 DIAGNOSIS — Z808 Family history of malignant neoplasm of other organs or systems: Secondary | ICD-10-CM | POA: Insufficient documentation

## 2019-12-06 DIAGNOSIS — Z833 Family history of diabetes mellitus: Secondary | ICD-10-CM | POA: Insufficient documentation

## 2019-12-06 DIAGNOSIS — L539 Erythematous condition, unspecified: Secondary | ICD-10-CM | POA: Insufficient documentation

## 2019-12-06 DIAGNOSIS — Z820 Family history of epilepsy and other diseases of the nervous system: Secondary | ICD-10-CM | POA: Insufficient documentation

## 2019-12-06 DIAGNOSIS — R1110 Vomiting, unspecified: Secondary | ICD-10-CM | POA: Insufficient documentation

## 2019-12-06 DIAGNOSIS — Z803 Family history of malignant neoplasm of breast: Secondary | ICD-10-CM | POA: Insufficient documentation

## 2019-12-06 DIAGNOSIS — L409 Psoriasis, unspecified: Secondary | ICD-10-CM | POA: Insufficient documentation

## 2019-12-06 DIAGNOSIS — Z841 Family history of disorders of kidney and ureter: Secondary | ICD-10-CM | POA: Insufficient documentation

## 2019-12-06 DIAGNOSIS — Z5111 Encounter for antineoplastic chemotherapy: Secondary | ICD-10-CM | POA: Insufficient documentation

## 2019-12-06 DIAGNOSIS — F1290 Cannabis use, unspecified, uncomplicated: Secondary | ICD-10-CM | POA: Insufficient documentation

## 2019-12-12 NOTE — Progress Notes (Signed)
[place]  [phone] [phone]    ID: Terri Estes DOB: Nov 23, 1986  MR#: [id number]  VAN#:[id number]  Patient Care Team: Patient, No Pcp Per as PCP - General (General Practice) Alphonsa Overall, MD as Consulting Physician (General Surgery) Jonee Lamore, [name], MD as Consulting Physician (Oncology) Kyung Rudd, MD as Consulting Physician (Radiation Oncology) Rockwell Germany, RN as Oncology Nurse Navigator Mauro Kaufmann, RN as Oncology Nurse Navigator Donnamae Jude, MD as Consulting Physician (Obstetrics and Gynecology) Pavelock, Ralene Bathe, MD as Consulting Physician (Internal Medicine) OTHER MD: Joylene Igo for [facility] is [phone]   CHIEF COMPLAINT: 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  She will receive a second dose of intravenous cyclophosphamide today since we cannot continue the methotrexate while she receives radiation therapy .  However she was not able to meet with radiation oncology previously because of family issues.  She tells me now she has arranged for her children to be cared for while she receives radiation and stays with a relative here in [place].  She should be able to start her radiation treatments right after Christmas  She presented to the ED on 11/29/2019 with nausea, vomiting, and diarrhea secondary to the cyclophosphamide.  This was full week after the original dose and I do not believe it was related to the medication.  She also continues on goserelin, with her most recent dose given on 11/09/2019.  She will receive a dose today.   REVIEW OF SYSTEMS: Terri Estes tells me the psoriasis is getting better but she has flaky skin all over.  She sleeps with no clothes on and the bed in the morning is  completely covered by flake skin.  In fact there was flaky skin all over the exam room today after the visit.  Nevertheless the lesions are a bit flatter and much less itchy.  She has established good communication with our social work department and will be receiving help with transportation.  They have also obtained some presents for her and her children for Christmas.  Detailed review of systems today was otherwise stable   HISTORY OF CURRENT ILLNESS: From the original intake note:  [name] presented with swelling along the left side of the face with neck pain. She then underwent a neck CT on 04/11/2019 showing: Enlarged left level 2 lymph nodes with associated inflammatory change including stranding of the adjacent fat and thickening of the platysma. While malignancy is not excluded, this is most concerning for infection. No focal abscess is present. Hypoattenuation within 1 of the left level 2 lymph nodes likely represents central necrosis or suppuration of the node. No primary malignancy or other abscess. Left upper lobe pulmonary nodules may be inflammatory. The largest measures 0.7 x 0.7 x 0.6 cm. CT of the chest with contrast may be useful for further evaluation.  She also underwent a chest CT on the same day showing: Complex right breast mass measuring 5.6 x 4.5 x 4.7 cm consistent with a primary breast carcinoma. Malignant right axillary adenopathy measuring up to 3.1 cm. Multiple bilateral pulmonary nodules are concerning for metastatic disease. Given the right breast mass, the left cervical lymph nodes are more likely malignant.  She then underwent bilateral diagnostic mammography with tomography and right breast ultrasonography at [facility] on 04/19/2019 showing: Breast Density Category  C; findings which are highly suspicious for multicentric inflammatory right breast cancer with right axillary nodal metastatic disease; no mammographic evidence of malignancy involving the  left breast.  Accordingly on 04/19/2019 she proceeded to biopsy of the right breast area in question. The pathology from this procedure showed ([id number]): invasive ductal carcinoma, grade III, upper inner quadrant, 12:30 o'clock, 5.0 cm from the nipple. Prognostic indicators significant for: estrogen receptor, 0% negative and progesterone receptor, 0% negative. Proliferation marker Ki67 at 90%. HER2 negative (0+).  Additional biopsies of the right breast and right axilla were performed on the same day. The pathology from this procedure showed ([id number]): 2. Breast, right, needle core biopsy, satellite mass UOQ, 10 o'clock, 8cmfn  - Invasive ductal carcinoma, grade III 3. Lymph node, needle/core biopsy, level 1 right axilla   - Invasive ductal carcinoma, grade III  The patient's subsequent history is as detailed below.   PAST MEDICAL HISTORY: Past Medical History:  Diagnosis Date  . Anemia    after pregnancy  . Anxiety    severe not taking meds  . Asthma    inhaler  4x per day  . Cancer ([place])   . Chronic pain   . Depression   . Family history of breast cancer   . GERD (gastroesophageal reflux disease)   . History of substance abuse ([facility])   . Pregnancy induced hypertension    takes medication now.  . Psoriasis    all over body  . Trichomonas infection   . Vaginal Pap smear, abnormal     PAST SURGICAL HISTORY: Past Surgical History:  Procedure Laterality Date  . CESAREAN SECTION  05/26/2008  . CESAREAN SECTION N/A 07/10/2017   Procedure: CESAREAN SECTION;  Surgeon: Florian Buff, MD;  Location: [facility];  Service: Obstetrics;  Laterality: N/A;  . CESAREAN SECTION N/A 09/23/2018   Procedure: CESAREAN SECTION;  Surgeon: Donnamae Jude, MD;  Location: [facility];  Service: Obstetrics;  Laterality: N/A;  . COLPOSCOPY    . MASTECTOMY WITH AXILLARY LYMPH NODE DISSECTION Right 10/16/2019   Procedure: RIGHT MASTECTOMY WITH AXILLARY LYMPH NODE DISSECTION;   Surgeon: Alphonsa Overall, MD;  Location: [facility];  Service: General;  Laterality: Right;  . PORTACATH PLACEMENT Left 04/30/2019   Procedure: INSERTION PORT-A-CATH WITH ULTRASOUND;  Surgeon: Alphonsa Overall, MD;  Location: [facility];  Service: General;  Laterality: Left;  Left Subclavian vein  . WISDOM TOOTH EXTRACTION      FAMILY HISTORY: Family History  Problem Relation Age of Onset  . Kidney disease Maternal Grandmother   . Breast cancer Maternal Grandmother 28       metastatic to liver; d. 37  . Kidney disease Maternal Grandfather   . Kidney disease Paternal Grandmother   . Diabetes Paternal Grandmother   . Breast cancer Paternal Grandmother   . Kidney disease Paternal Grandfather   . Breast cancer Paternal Aunt   . Cerebral palsy Child   . Breast cancer Paternal Great-grandmother   . Pancreatic cancer Other        PGMs brother   . Throat cancer Paternal Uncle   . Breast cancer Other        PGMs sister  (Updated 04/25/2019) Asani's father is living at age 77. Patients' mother is also living at age 69. Marland Kitchen[place] notes, however, that she was raised by her grandmother.) The patient has 0 brothers and 5 sisters. Patient denies anyone in her family having ovarian, prostate, or pancreatic cancer. Her maternal grandmother was diagnosed with  breast cancer, unsure what age. On her father's side, she reports her grandmother had cysts in her breasts, her great-aunt might have had breast cancer, and her great-grandmother was diagnosed with breast cancer at an older age.   GYNECOLOGIC HISTORY:  No LMP recorded. (Menstrual status: Chemotherapy).  She had an emergent C-section in 09/23/2018 for abruptio placenta at 30 weeks pregnancy, also has a history of eclampsia Menarche:    years old Age at first live birth: 33 years old [place] P: 3 LMP: 04/18/2019 Contraceptive: On goserelin Hysterectomy?: no BSO?: no   SOCIAL HISTORY: (Current as of August 2020) Terri Estes is currently unemployed. She previously  worked at [employer]. She is engaged. Fiance [name] runs a Midwife and works other odd jobs.  The patient recently moved to [place].  Daughter Demetrius Charity, age 35, was abused by her father at 59 months and has cerebral palsy. Son [name], age 41, has severe asthma and is developmentally disabled. Daughter Yetta Glassman was born on 09/23/2018 prematurely and remains on oxygen at home.  The patient's aunt has been staying with her and is helping with the children and providing general support   ADVANCED DIRECTIVES: not in place.    HEALTH MAINTENANCE: Social History   Tobacco Use  . Smoking status: Current Every Day Smoker    Packs/day: 0.10    Years: 0.00    Pack years: 0.00    Types: Cigarettes  . Smokeless tobacco: Never Used  . Tobacco comment: trying to quit - smokes 2 cigarettes/day  Substance Use Topics  . Alcohol use: No  . Drug use: Yes    Frequency: 7.0 times per week    Types: Marijuana    Comment: subutex last dose 10/15/19, last dose Sun 10/14/19    Colonoscopy: n/a  PAP: 05/10/2018  Bone density: never done Mammography: first performed following abnormal CT  No Known Allergies   Current Outpatient Medications  Medication Sig Dispense Refill  . acyclovir ointment (ZOVIRAX) 5 % Apply 1 application topically 2 (two) times daily as needed. Mix 1 part acyclovir cream with 1 part over the counter hydrocortisone 1% cream and apply to affected area two times a day as needed 5 g 0  . albuterol (PROVENTIL HFA;VENTOLIN HFA) 108 (90 Base) MCG/ACT inhaler Inhale 2 puffs into the lungs 4 (four) times daily. 1 Inhaler 2  . buprenorphine (SUBUTEX) 8 MG SUBL SL tablet Place 8 mg under the tongue 2 (two) times daily.     . cholestyramine (QUESTRAN) 4 g packet Take 1 packet (4 g total) by mouth 3 (three) times daily with meals. 60 each 12  . cyclobenzaprine (FLEXERIL) 10 MG tablet Take 1 tablet (10 mg total) by mouth 3 (three) times daily as needed for muscle spasms. 90 tablet 2    . folic acid (FOLVITE) 1 MG tablet Take 1 tablet (1 mg total) by mouth daily. 60 tablet 3  . gabapentin (NEURONTIN) 300 MG capsule TAKE 1 CAPSULE(300 MG) BY MOUTH THREE TIMES DAILY (Patient taking differently: Take 300 mg by mouth 3 (three) times daily. ) 90 capsule 0  . hydrocortisone (ANUSOL-HC) 2.5 % rectal cream Place 1 application rectally 2 (two) times daily. 60 g 0  . ketoconazole (NIZORAL) 2 % cream Apply 1 application topically daily. 15 g 0  . metoCLOPramide (REGLAN) 10 MG tablet Take 1 tablet (10 mg total) by mouth every 8 (eight) hours as needed for nausea. 30 tablet 0  . omeprazole (PRILOSEC) 40 MG capsule Take 1 capsule (40  mg total) by mouth daily. 30 capsule 5  . ondansetron (ZOFRAN) 8 MG tablet Take 1 tablet (8 mg total) by mouth every 8 (eight) hours as needed for nausea or vomiting. 20 tablet 0  . PARoxetine (PAXIL) 10 MG tablet Take 1 tablet (10 mg total) by mouth daily. (Patient not taking: Reported on 11/15/2019) 30 tablet 5  . Prenatal MV-Min-FA-Omega-3 (PRENATAL GUMMIES/DHA & FA) 0.4-32.5 MG CHEW Chew 2 each by mouth daily.    . promethazine (PHENERGAN) 25 MG suppository Place 1 suppository (25 mg total) rectally every 6 (six) hours as needed for nausea or vomiting. 12 each 0  . valACYclovir (VALTREX) 1000 MG tablet Take 1 tablet (1,000 mg total) by mouth 2 (two) times daily. 60 tablet 0   No current facility-administered medications for this visit.   Facility-Administered Medications Ordered in Other Visits  Medication Dose Route Frequency Provider Last Rate Last Admin  . 0.9 % NaCl with KCl 20 mEq/ L  infusion   Intravenous Once Delayna Sparlin, [name], MD      . sodium chloride flush (NS) 0.9 % injection 10 mL  10 mL Intracatheter PRN Lexani Corona, [name], MD        OBJECTIVE: Young African-American woman   Vitals:   12/13/19 1253  BP: (!) 92/52  Pulse: 98  Resp: 20  Temp: 98.9 F (37.2 C)  SpO2: 100%   Wt Readings from Last 3 Encounters:  12/13/19 172 lb 11.2 oz  (78.3 kg)  11/29/19 175 lb 8 oz (79.6 kg)  11/21/19 175 lb 8 oz (79.6 kg)   Body mass index is 28.74 kg/m.    ECOG FS:1 - Symptomatic but completely ambulatory  Sclerae unicteric, EOMs intact Wearing a mask Continuing total body psoriasis as imaged below, but now slightly flatter, slightly less erythematous, with widespread desquamation The skin over the right chest wall is considerably flatter and less erythematous than previously   Skin 11/09/2019      LAB RESULTS:  CMP     Component Value Date/Time   NA 143 11/29/2019 0141   NA 137 05/10/2018 1535   K 3.6 11/29/2019 0141   CL 104 11/29/2019 0141   CO2 29 11/29/2019 0141   GLUCOSE 99 11/29/2019 0141   BUN <5 (L) 11/29/2019 0141   BUN 7 05/10/2018 1535   CREATININE 0.97 11/29/2019 0141   CREATININE 0.81 07/03/2019 0919   CALCIUM 9.7 11/29/2019 0141   PROT 7.0 11/29/2019 0141   PROT 7.2 05/10/2018 1535   ALBUMIN 3.8 11/29/2019 0141   ALBUMIN 4.2 05/10/2018 1535   AST 16 11/29/2019 0141   AST 54 (H) 07/03/2019 0919   ALT 14 11/29/2019 0141   ALT 102 (H) 07/03/2019 0919   ALKPHOS 67 11/29/2019 0141   BILITOT 0.6 11/29/2019 0141   BILITOT <0.2 (L) 07/03/2019 0919   GFRNONAA >60 11/29/2019 0141   GFRNONAA >60 07/03/2019 0919   GFRAA >60 11/29/2019 0141   GFRAA >60 07/03/2019 0919    No results found for: TOTALPROTELP, ALBUMINELP, A1GS, A2GS, BETS, BETA2SER, GAMS, MSPIKE, SPEI  No results found for: KPAFRELGTCHN, LAMBDASER, KAPLAMBRATIO  Lab Results  Component Value Date   WBC 4.7 12/13/2019   NEUTROABS 3.3 12/13/2019   HGB 10.8 (L) 12/13/2019   HCT 33.5 (L) 12/13/2019   MCV 92.8 12/13/2019   PLT 264 12/13/2019    No results found for: LABCA2  No components found for: [id number]  No results for input(s): INR in the last 168 hours.  No results found  for: LABCA2  No results found for: TWS568  No results found for: LEX517  No results found for: GYF749  No results found for: CA2729  No  components found for: HGQUANT  No results found for: CEA1 / No results found for: CEA1   No results found for: AFPTUMOR  No results found for: CHROMOGRNA  No results found for: PSA1  Appointment on 12/13/2019  Component Date Value Ref Range Status  . WBC 12/13/2019 4.7  4.0 - 10.5 K/uL Final  . RBC 12/13/2019 3.61* 3.87 - 5.11 MIL/uL Final  . Hemoglobin 12/13/2019 10.8* 12.0 - 15.0 g/dL Final  . HCT 12/13/2019 33.5* 36.0 - 46.0 % Final  . MCV 12/13/2019 92.8  80.0 - 100.0 fL Final  . MCH 12/13/2019 29.9  26.0 - 34.0 pg Final  . MCHC 12/13/2019 32.2  30.0 - 36.0 g/dL Final  . RDW 12/13/2019 14.7  11.5 - 15.5 % Final  . Platelets 12/13/2019 264  150 - 400 K/uL Final  . nRBC 12/13/2019 0.0  0.0 - 0.2 % Final  . Neutrophils Relative % 12/13/2019 70  % Final  . Neutro Abs 12/13/2019 3.3  1.7 - 7.7 K/uL Final  . Lymphocytes Relative 12/13/2019 17  % Final  . Lymphs Abs 12/13/2019 0.8  0.7 - 4.0 K/uL Final  . Monocytes Relative 12/13/2019 9  % Final  . Monocytes Absolute 12/13/2019 0.4  0.1 - 1.0 K/uL Final  . Eosinophils Relative 12/13/2019 3  % Final  . Eosinophils Absolute 12/13/2019 0.2  0.0 - 0.5 K/uL Final  . Basophils Relative 12/13/2019 1  % Final  . Basophils Absolute 12/13/2019 0.0  0.0 - 0.1 K/uL Final  . Immature Granulocytes 12/13/2019 0  % Final  . Abs Immature Granulocytes 12/13/2019 0.01  0.00 - 0.07 K/uL Final   Performed at [facility], [address] [place], [place] [address]    (this displays the last labs from the last 3 days)  No results found for: TOTALPROTELP, ALBUMINELP, A1GS, A2GS, BETS, BETA2SER, GAMS, MSPIKE, SPEI (this displays SPEP labs)  No results found for: KPAFRELGTCHN, LAMBDASER, KAPLAMBRATIO (kappa/lambda light chains)  Lab Results  Component Value Date   HGBA 97.4 12/16/2016   (Hemoglobinopathy evaluation)   No results found for: LDH  No results found for: IRON, TIBC, IRONPCTSAT (Iron and TIBC)  Lab  Results  Component Value Date   FERRITIN 900 (H) 09/12/2019    Urinalysis    Component Value Date/Time   COLORURINE YELLOW 11/29/2019 0138   APPEARANCEUR HAZY (A) 11/29/2019 0138   LABSPEC 1.020 11/29/2019 0138   PHURINE 6.0 11/29/2019 0138   GLUCOSEU NEGATIVE 11/29/2019 0138   HGBUR NEGATIVE 11/29/2019 0138   BILIRUBINUR NEGATIVE 11/29/2019 0138   KETONESUR 5 (A) 11/29/2019 0138   PROTEINUR NEGATIVE 11/29/2019 0138   UROBILINOGEN 4.0 (H) 01/13/2018 1417   NITRITE NEGATIVE 11/29/2019 0138   LEUKOCYTESUR MODERATE (A) 11/29/2019 0138     STUDIES:  No results found.   ELIGIBLE FOR AVAILABLE RESEARCH PROTOCOL: no   ASSESSMENT: 33 y.o. [place]. [place] woman status post right breast biopsy x2 and right axillary lymph node biopsy 04/19/2019 for a clinical T4 N2 MX, stage IIIC invasive ductal carcinoma, grade 3, triple negative, with an MIB-1 of 90%  (a) chest CT scan 04/11/2019 shows multiple subcentimeter lung nodules  (b) PET scan denied by insurance  (c) CT A/P and bone scan due 05/09/2019 show a very small low-density lesion in the lateral segment of the left  liver which is indeterminate, and sclerosis of the right SI joint, which is felt to be likely degenerative; bone scan was entirely negative.  (c) baseline breast MRI 04/30/2019 shows involvement of all 4 quadrants in the right breast as well as right axillary subpectoral and internal mammary lymphadenopathy.  Left breast was unremarkable  (1) neoadjuvant chemotherapy consisting of  (a) pembrolizumab started 05/03/2019, third dose delayed because of elevated liver function tests, resumed 08/02/2019  (b) paclitaxel weekly x12 with carboplatin every 21 days x 4, started 05/08/2019   (i) tolerated carboplatin AUC 5-day 1 poorly, switched to AUC 2 on day 1 day 8   (ii) cycle 2 delayed 1 week because of elevated liver function tests   (iii) carboplatin and paclitaxel discontinued after 07/03/2019 because of neuropathy, (received 7  weekly doses paclitaxel and one AUC5 carbo dose, four AUC2 doses)  (c) cyclophosphamide and doxorubicin x4 started 07/03/2019, completed 08/29/2019   (i) 2d dose given day 28 due to poor tolerance; other doses Q 21 days   (2) s/p right mastectomy and ALND 10/16/2019 showing a residual ypT2 ypN2 invasive ductal carcinoma, grade 3, with negative margins  (a) a total of 10 lymph nodes were removed, 4 with macrometastases, 1 with a micrometastasis  (3) adjuvant radiation to follow, consider capecitabine sensitization  (4) genetics testing  (a) genetic testing 05/14/2019 through the Common Hereditary Gene Panel offered by [facility] found no deleterious mutations in APC, ATM, AXIN2, BARD1, BMPR1A, BRCA1, BRCA2, BRIP1, CDH1, CDK4, CDKN2A (p14ARF), CDKN2A (p16INK4a), CHEK2, CTNNA1, DICER1, EPCAM (Deletion/duplication testing only), GREM1 (promoter region deletion/duplication testing only), KIT, MEN1, MLH1, MSH2, MSH3, MSH6, MUTYH, NBN, NF1, NHTL1, PALB2, PDGFRA, PMS2, POLD1, POLE, PTEN, RAD50, RAD51C, RAD51D, RNF43, SDHB, SDHC, SDHD, SMAD4, SMARCA4. STK11, TP53, TSC1, TSC2, and VHL.  The following genes were evaluated for sequence changes only: SDHA and HOXB13 c.251G>A variant only  (5) goserelin started 05/08/2019  (6) pembrolizumab started 08/02/2019, stopped after fourth dose (10/02/2019) with psoriasis flare  (7) psoriasis:  (a) weekly methotrexate started 11/10/2019, held after 11/17/2019 dose so as not to overlap with radiation  (b) intravenous cyclophosphamide 1 g every 3 weeks starting 11/21/2019     PLAN: Terri Estes is benefiting from the cyclophosphamide.  She had an episode of vomiting a week after the treatment.  I do not think that this was related so today we did not change her nausea medications.  She has Compazine and Zofran at home and she also has Phenergan suppositories at home that she may use.  She is low on her Subutex and the pain clinic asked [place] to draw some lab work and have it  faxed to them at [phone] attention [place].  We were glad to do that for her them.  Hopefully the patient will be scheduled to start her radiation treatments as soon as possible now that she has made arrangement to stay in [facility] for the several weeks it will take for the treatment to be completed.  We are continuing the goserelin every 28 days and the Cytoxan every 21 days, to be changed back to methotrexate once the radiation is done  She will see [place] with the next Cytoxan dose.  Aalaya Yadao, [name], MD  12/13/19 1:19 PM Medical Oncology and Hematology [facility] [place], [place] [address] Tel. [phone]    Fax. [phone]   I, Wilburn Mylar, am acting as scribe for Dr. [name]. Tadan Shill.  Lindie Spruce MD, have reviewed the above documentation for  accuracy and completeness, and I agree with the above.

## 2019-12-13 ENCOUNTER — Other Ambulatory Visit: Payer: Self-pay

## 2019-12-13 ENCOUNTER — Inpatient Hospital Stay: Payer: Medicaid Other

## 2019-12-13 ENCOUNTER — Other Ambulatory Visit: Payer: Self-pay | Admitting: Oncology

## 2019-12-13 ENCOUNTER — Inpatient Hospital Stay (HOSPITAL_BASED_OUTPATIENT_CLINIC_OR_DEPARTMENT_OTHER): Payer: Medicaid Other | Admitting: Oncology

## 2019-12-13 VITALS — BP 92/52 | HR 98 | Temp 98.9°F | Resp 20 | Ht 65.0 in | Wt 172.7 lb

## 2019-12-13 DIAGNOSIS — Z171 Estrogen receptor negative status [ER-]: Secondary | ICD-10-CM | POA: Diagnosis not present

## 2019-12-13 DIAGNOSIS — Z808 Family history of malignant neoplasm of other organs or systems: Secondary | ICD-10-CM | POA: Diagnosis not present

## 2019-12-13 DIAGNOSIS — G620 Drug-induced polyneuropathy: Secondary | ICD-10-CM | POA: Diagnosis not present

## 2019-12-13 DIAGNOSIS — F17210 Nicotine dependence, cigarettes, uncomplicated: Secondary | ICD-10-CM | POA: Diagnosis not present

## 2019-12-13 DIAGNOSIS — F1290 Cannabis use, unspecified, uncomplicated: Secondary | ICD-10-CM | POA: Diagnosis not present

## 2019-12-13 DIAGNOSIS — C50811 Malignant neoplasm of overlapping sites of right female breast: Secondary | ICD-10-CM

## 2019-12-13 DIAGNOSIS — Z79899 Other long term (current) drug therapy: Secondary | ICD-10-CM | POA: Diagnosis not present

## 2019-12-13 DIAGNOSIS — Z803 Family history of malignant neoplasm of breast: Secondary | ICD-10-CM | POA: Diagnosis not present

## 2019-12-13 DIAGNOSIS — L408 Other psoriasis: Secondary | ICD-10-CM

## 2019-12-13 DIAGNOSIS — Z820 Family history of epilepsy and other diseases of the nervous system: Secondary | ICD-10-CM | POA: Diagnosis not present

## 2019-12-13 DIAGNOSIS — T451X5A Adverse effect of antineoplastic and immunosuppressive drugs, initial encounter: Secondary | ICD-10-CM

## 2019-12-13 DIAGNOSIS — R1110 Vomiting, unspecified: Secondary | ICD-10-CM | POA: Diagnosis not present

## 2019-12-13 DIAGNOSIS — Z841 Family history of disorders of kidney and ureter: Secondary | ICD-10-CM | POA: Diagnosis not present

## 2019-12-13 DIAGNOSIS — Z833 Family history of diabetes mellitus: Secondary | ICD-10-CM | POA: Diagnosis not present

## 2019-12-13 DIAGNOSIS — Z95828 Presence of other vascular implants and grafts: Secondary | ICD-10-CM

## 2019-12-13 DIAGNOSIS — Z5111 Encounter for antineoplastic chemotherapy: Secondary | ICD-10-CM | POA: Diagnosis not present

## 2019-12-13 DIAGNOSIS — R7989 Other specified abnormal findings of blood chemistry: Secondary | ICD-10-CM | POA: Diagnosis not present

## 2019-12-13 DIAGNOSIS — Z800 Family history of malignant neoplasm of digestive organs: Secondary | ICD-10-CM | POA: Diagnosis not present

## 2019-12-13 DIAGNOSIS — Z6828 Body mass index (BMI) 28.0-28.9, adult: Secondary | ICD-10-CM | POA: Diagnosis not present

## 2019-12-13 DIAGNOSIS — L409 Psoriasis, unspecified: Secondary | ICD-10-CM | POA: Diagnosis not present

## 2019-12-13 DIAGNOSIS — L539 Erythematous condition, unspecified: Secondary | ICD-10-CM | POA: Diagnosis not present

## 2019-12-13 LAB — CBC WITH DIFFERENTIAL/PLATELET
Abs Immature Granulocytes: 0.01 10*3/uL (ref 0.00–0.07)
Basophils Absolute: 0 10*3/uL (ref 0.0–0.1)
Basophils Relative: 1 %
Eosinophils Absolute: 0.2 10*3/uL (ref 0.0–0.5)
Eosinophils Relative: 3 %
HCT: 33.5 % — ABNORMAL LOW (ref 36.0–46.0)
Hemoglobin: 10.8 g/dL — ABNORMAL LOW (ref 12.0–15.0)
Immature Granulocytes: 0 %
Lymphocytes Relative: 17 %
Lymphs Abs: 0.8 10*3/uL (ref 0.7–4.0)
MCH: 29.9 pg (ref 26.0–34.0)
MCHC: 32.2 g/dL (ref 30.0–36.0)
MCV: 92.8 fL (ref 80.0–100.0)
Monocytes Absolute: 0.4 10*3/uL (ref 0.1–1.0)
Monocytes Relative: 9 %
Neutro Abs: 3.3 10*3/uL (ref 1.7–7.7)
Neutrophils Relative %: 70 %
Platelets: 264 10*3/uL (ref 150–400)
RBC: 3.61 MIL/uL — ABNORMAL LOW (ref 3.87–5.11)
RDW: 14.7 % (ref 11.5–15.5)
WBC: 4.7 10*3/uL (ref 4.0–10.5)
nRBC: 0 % (ref 0.0–0.2)

## 2019-12-13 LAB — COMPREHENSIVE METABOLIC PANEL
ALT: 32 U/L (ref 0–44)
AST: 45 U/L — ABNORMAL HIGH (ref 15–41)
Albumin: 3.8 g/dL (ref 3.5–5.0)
Alkaline Phosphatase: 80 U/L (ref 38–126)
Anion gap: 12 (ref 5–15)
BUN: 7 mg/dL (ref 6–20)
CO2: 26 mmol/L (ref 22–32)
Calcium: 9.2 mg/dL (ref 8.9–10.3)
Chloride: 105 mmol/L (ref 98–111)
Creatinine, Ser: 0.85 mg/dL (ref 0.44–1.00)
GFR calc Af Amer: >60 mL/min (ref >60)
GFR calc non Af Amer: >60 mL/min (ref >60)
Glucose, Bld: 105 mg/dL — ABNORMAL HIGH (ref 70–99)
Potassium: 3.4 mmol/L — ABNORMAL LOW (ref 3.5–5.1)
Sodium: 143 mmol/L (ref 135–145)
Total Bilirubin: 0.3 mg/dL (ref 0.3–1.2)
Total Protein: 7.2 g/dL (ref 6.5–8.1)

## 2019-12-13 LAB — RAPID URINE DRUG SCREEN, HOSP PERFORMED
Amphetamines: NOT DETECTED
Barbiturates: NOT DETECTED
Benzodiazepines: NOT DETECTED
Cocaine: POSITIVE — AB
Opiates: NOT DETECTED
Tetrahydrocannabinol: POSITIVE — AB

## 2019-12-13 LAB — TSH: TSH: 0.166 u[IU]/mL — ABNORMAL LOW (ref 0.308–3.960)

## 2019-12-13 MED ORDER — SODIUM CHLORIDE 0.9 % IV SOLN
Freq: Once | INTRAVENOUS | Status: AC
  Filled 2019-12-13: qty 250

## 2019-12-13 MED ORDER — SODIUM CHLORIDE 0.9 % IV SOLN
1000.0000 mg | Freq: Once | INTRAVENOUS | Status: AC
  Administered 2019-12-13: 1000 mg via INTRAVENOUS
  Filled 2019-12-13: qty 50

## 2019-12-13 MED ORDER — GOSERELIN ACETATE 3.6 MG ~~LOC~~ IMPL
3.6000 mg | DRUG_IMPLANT | Freq: Once | SUBCUTANEOUS | Status: AC
  Administered 2019-12-13: 3.6 mg via SUBCUTANEOUS

## 2019-12-13 MED ORDER — HEPARIN SOD (PORK) LOCK FLUSH 100 UNIT/ML IV SOLN
500.0000 [IU] | Freq: Once | INTRAVENOUS | Status: AC
  Administered 2019-12-13: 500 [IU] via INTRAVENOUS
  Filled 2019-12-13: qty 5

## 2019-12-13 MED ORDER — PROCHLORPERAZINE EDISYLATE 10 MG/2ML IJ SOLN
INTRAMUSCULAR | Status: AC
  Filled 2019-12-13: qty 2

## 2019-12-13 MED ORDER — SODIUM CHLORIDE 0.9% FLUSH
10.0000 mL | Freq: Once | INTRAVENOUS | Status: AC
  Administered 2019-12-13: 10 mL via INTRAVENOUS
  Filled 2019-12-13: qty 10

## 2019-12-13 MED ORDER — SODIUM CHLORIDE 0.9% FLUSH
10.0000 mL | INTRAVENOUS | Status: DC | PRN
  Administered 2019-12-13: 10 mL via INTRAVENOUS
  Filled 2019-12-13: qty 10

## 2019-12-13 MED ORDER — PROCHLORPERAZINE EDISYLATE 10 MG/2ML IJ SOLN
10.0000 mg | Freq: Once | INTRAMUSCULAR | Status: AC
  Administered 2019-12-13: 10 mg via INTRAVENOUS

## 2019-12-13 MED ORDER — GOSERELIN ACETATE 3.6 MG ~~LOC~~ IMPL
DRUG_IMPLANT | SUBCUTANEOUS | Status: AC
  Filled 2019-12-13: qty 3.6

## 2019-12-13 NOTE — Patient Instructions (Signed)
[facility] Discharge Instructions for Patients Receiving Chemotherapy  Today you received the following chemotherapy agents: cyclophosphamide  To help prevent nausea and vomiting after your treatment, we encourage you to take your nausea medication as directed.    If you develop nausea and vomiting that is not controlled by your nausea medication, call the clinic.   BELOW ARE SYMPTOMS THAT SHOULD BE REPORTED IMMEDIATELY:  *FEVER GREATER THAN 100.5 F  *CHILLS WITH OR WITHOUT FEVER  NAUSEA AND VOMITING THAT IS NOT CONTROLLED WITH YOUR NAUSEA MEDICATION  *UNUSUAL SHORTNESS OF BREATH  *UNUSUAL BRUISING OR BLEEDING  TENDERNESS IN MOUTH AND THROAT WITH OR WITHOUT PRESENCE OF ULCERS  *URINARY PROBLEMS  *BOWEL PROBLEMS  UNUSUAL RASH Items with * indicate a potential emergency and should be followed up as soon as possible.  Feel free to call the clinic should you have any questions or concerns. The clinic phone number is [phone].  Please show the [facility] at check-in to the Emergency Department and triage nurse.  Cyclophosphamide injection What is this medicine? CYCLOPHOSPHAMIDE (sye kloe FOSS fa mide) is a chemotherapy drug. It slows the growth of cancer cells. This medicine is used to treat many types of cancer like lymphoma, myeloma, leukemia, breast cancer, and ovarian cancer, to name a few. This medicine may be used for other purposes; ask your health care provider or pharmacist if you have questions. COMMON BRAND NAME(S): Cytoxan, Neosar What should I tell my health care provider before I take this medicine? They need to know if you have any of these conditions:  blood disorders  history of other chemotherapy  infection  kidney disease  liver disease  recent or ongoing radiation therapy  tumors in the bone marrow  an unusual or allergic reaction to cyclophosphamide, other chemotherapy, other medicines, foods, dyes, or  preservatives  pregnant or trying to get pregnant  breast-feeding How should I use this medicine? This drug is usually given as an injection into a vein or muscle or by infusion into a vein. It is administered in a hospital or clinic by a specially trained health care professional. Talk to your pediatrician regarding the use of this medicine in children. Special care may be needed. Overdosage: If you think you have taken too much of this medicine contact a poison control center or emergency room at once. NOTE: This medicine is only for you. Do not share this medicine with others. What if I miss a dose? It is important not to miss your dose. Call your doctor or health care professional if you are unable to keep an appointment. What may interact with this medicine? This medicine may interact with the following medications:  amiodarone  amphotericin B  azathioprine  certain antiviral medicines for HIV or AIDS such as protease inhibitors (e.g., indinavir, ritonavir) and zidovudine  certain blood pressure medications such as benazepril, captopril, enalapril, fosinopril, lisinopril, moexipril, monopril, perindopril, quinapril, ramipril, trandolapril  certain cancer medications such as anthracyclines (e.g., daunorubicin, doxorubicin), busulfan, cytarabine, paclitaxel, pentostatin, tamoxifen, trastuzumab  certain diuretics such as chlorothiazide, chlorthalidone, hydrochlorothiazide, indapamide, metolazone  certain medicines that treat or prevent blood clots like warfarin  certain muscle relaxants such as succinylcholine  cyclosporine  etanercept  indomethacin  medicines to increase blood counts like filgrastim, pegfilgrastim, sargramostim  medicines used as general anesthesia  metronidazole  natalizumab This list may not describe all possible interactions. Give your health care provider a list of all the medicines, herbs, non-prescription drugs, or dietary supplements you  use.  Also tell them if you smoke, drink alcohol, or use illegal drugs. Some items may interact with your medicine. What should I watch for while using this medicine? Visit your doctor for checks on your progress. This drug may make you feel generally unwell. This is not uncommon, as chemotherapy can affect healthy cells as well as cancer cells. Report any side effects. Continue your course of treatment even though you feel ill unless your doctor tells you to stop. Drink water or other fluids as directed. Urinate often, even at night. In some cases, you may be given additional medicines to help with side effects. Follow all directions for their use. Call your doctor or health care professional for advice if you get a fever, chills or sore throat, or other symptoms of a cold or flu. Do not treat yourself. This drug decreases your body's ability to fight infections. Try to avoid being around people who are sick. This medicine may increase your risk to bruise or bleed. Call your doctor or health care professional if you notice any unusual bleeding. Be careful brushing and flossing your teeth or using a toothpick because you may get an infection or bleed more easily. If you have any dental work done, tell your dentist you are receiving this medicine. You may get drowsy or dizzy. Do not drive, use machinery, or do anything that needs mental alertness until you know how this medicine affects you. Do not become pregnant while taking this medicine or for 1 year after stopping it. Women should inform their doctor if they wish to become pregnant or think they might be pregnant. Men should not father a child while taking this medicine and for 4 months after stopping it. There is a potential for serious side effects to an unborn child. Talk to your health care professional or pharmacist for more information. Do not breast-feed an infant while taking this medicine. This medicine may interfere with the ability to have a  child. This medicine has caused ovarian failure in some women. This medicine has caused reduced sperm counts in some men. You should talk with your doctor or health care professional if you are concerned about your fertility. If you are going to have surgery, tell your doctor or health care professional that you have taken this medicine. What side effects may I notice from receiving this medicine? Side effects that you should report to your doctor or health care professional as soon as possible:  allergic reactions like skin rash, itching or hives, swelling of the face, lips, or tongue  low blood counts - this medicine may decrease the number of white blood cells, red blood cells and platelets. You may be at increased risk for infections and bleeding.  signs of infection - fever or chills, cough, sore throat, pain or difficulty passing urine  signs of decreased platelets or bleeding - bruising, pinpoint red spots on the skin, black, tarry stools, blood in the urine  signs of decreased red blood cells - unusually weak or tired, fainting spells, lightheadedness  breathing problems  dark urine  dizziness  palpitations  swelling of the ankles, feet, hands  trouble passing urine or change in the amount of urine  weight gain  yellowing of the eyes or skin Side effects that usually do not require medical attention (report to your doctor or health care professional if they continue or are bothersome):  changes in nail or skin color  hair loss  missed menstrual periods  mouth sores  nausea, vomiting This list may not describe all possible side effects. Call your doctor for medical advice about side effects. You may report side effects to FDA at 1-800-FDA-1088. Where should I keep my medicine? This drug is given in a hospital or clinic and will not be stored at home. NOTE: This sheet is a summary. It may not cover all possible information. If you have questions about this medicine,  talk to your doctor, pharmacist, or health care provider.  2020 [employer] (2012-10-27 16:22:58)

## 2019-12-14 ENCOUNTER — Telehealth: Payer: Self-pay | Admitting: Radiation Oncology

## 2019-12-14 ENCOUNTER — Telehealth: Payer: Self-pay | Admitting: Oncology

## 2019-12-14 NOTE — Telephone Encounter (Signed)
I left a message regarding schedule  

## 2019-12-14 NOTE — Telephone Encounter (Signed)
New message:   LVM for patient to return call to R/S ct simulation appt.

## 2019-12-17 ENCOUNTER — Telehealth: Payer: Self-pay | Admitting: Radiation Oncology

## 2019-12-17 NOTE — Telephone Encounter (Signed)
New message:   LVM for patient to return call to schedule appt for a ct simulation.

## 2019-12-24 LAB — MISC LABCORP TEST (SEND OUT): Labcorp test code: 70103

## 2020-01-01 ENCOUNTER — Encounter: Payer: Self-pay | Admitting: *Deleted

## 2020-01-02 NOTE — Progress Notes (Signed)
[name]  [phone] [phone]    ID: Terri Estes DOB: August 19, 1986  MR#: [id number]  GYK#:[id number]  Patient Care Team: Patient, No Pcp Per as PCP - General (General Practice) Alphonsa Overall, MD as Consulting Physician (General Surgery) Mckenna Gamm, [name], MD as Consulting Physician (Oncology) Kyung Rudd, MD as Consulting Physician (Radiation Oncology) Rockwell Germany, RN as Oncology Nurse Navigator Mauro Kaufmann, RN as Oncology Nurse Navigator Donnamae Jude, MD as Consulting Physician (Obstetrics and Gynecology) Pavelock, Ralene Bathe, MD as Consulting Physician (Internal Medicine) OTHER MD: Joylene Igo for [facility] is [phone]   CHIEF COMPLAINT: 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  She will receive a dose of intravenous cyclophosphamide today.  She is receiving these pending radiation since we cannot continue the methotrexate while she receives radiation therapy.  However she has not set up her CT simulation appointment yet, despite being scheduled more than 1 time by radiation oncology.  She also continues on goserelin, with her most recent dose given on 12/13/2019.  Does should have been done today.   REVIEW OF SYSTEMS:   HISTORY OF CURRENT ILLNESS: From the original intake note:  Terri Estes presented with swelling along the left side of the face with neck pain. She then underwent a neck CT on 04/11/2019 showing: Enlarged left level 2 lymph nodes with associated inflammatory change including stranding of the adjacent fat and thickening of the platysma. While malignancy is not excluded, this is most concerning for infection. No focal abscess is present. Hypoattenuation within 1 of the left level  2 lymph nodes likely represents central necrosis or suppuration of the node. No primary malignancy or other abscess. Left upper lobe pulmonary nodules may be inflammatory. The largest measures 0.7 x 0.7 x 0.6 cm. CT of the chest with contrast may be useful for further evaluation.  She also underwent a chest CT on the same day showing: Complex right breast mass measuring 5.6 x 4.5 x 4.7 cm consistent with a primary breast carcinoma. Malignant right axillary adenopathy measuring up to 3.1 cm. Multiple bilateral pulmonary nodules are concerning for metastatic disease. Given the right breast mass, the left cervical lymph nodes are more likely malignant.  She then underwent bilateral diagnostic mammography with tomography and right breast ultrasonography at [facility] on 04/19/2019 showing: Breast Density Category C; findings which are highly suspicious for multicentric inflammatory right breast cancer with right axillary nodal metastatic disease; no mammographic evidence of malignancy involving the left breast.  Accordingly on 04/19/2019 she proceeded to biopsy of the right breast area in question. The pathology from this procedure showed ([id number]): invasive ductal carcinoma, grade III, upper inner quadrant, 12:30 o'clock, 5.0 cm from the nipple. Prognostic indicators significant for: estrogen receptor, 0% negative and progesterone receptor, 0% negative. Proliferation marker Ki67 at 90%. HER2 negative (0+).  Additional biopsies of the right breast and right axilla were performed on the same day. The pathology from this procedure showed ([id number]): 2. Breast, right, needle core biopsy, satellite mass UOQ, 10 o'clock, 8cmfn  - Invasive ductal carcinoma, grade III 3. Lymph node, needle/core biopsy, level 1 right axilla   - Invasive ductal carcinoma, grade III  The patient's subsequent history is as detailed below.   PAST MEDICAL HISTORY: Past Medical History:  Diagnosis Date  . Anemia  after pregnancy  . Anxiety    severe not taking meds  . Asthma    inhaler  4x per day  . Cancer ([place])   . Chronic pain   . Depression   . Family history of breast cancer   . GERD (gastroesophageal reflux disease)   . History of substance abuse ([place])   . Pregnancy induced hypertension    takes medication now.  . Psoriasis    all over body  . Trichomonas infection   . Vaginal Pap smear, abnormal     PAST SURGICAL HISTORY: Past Surgical History:  Procedure Laterality Date  . CESAREAN SECTION  05/26/2008  . CESAREAN SECTION N/A 07/10/2017   Procedure: CESAREAN SECTION;  Surgeon: Florian Buff, MD;  Location: [facility];  Service: Obstetrics;  Laterality: N/A;  . CESAREAN SECTION N/A 09/23/2018   Procedure: CESAREAN SECTION;  Surgeon: Donnamae Jude, MD;  Location: [facility];  Service: Obstetrics;  Laterality: N/A;  . COLPOSCOPY    . MASTECTOMY WITH AXILLARY LYMPH NODE DISSECTION Right 10/16/2019   Procedure: RIGHT MASTECTOMY WITH AXILLARY LYMPH NODE DISSECTION;  Surgeon: Alphonsa Overall, MD;  Location: [facility];  Service: General;  Laterality: Right;  . PORTACATH PLACEMENT Left 04/30/2019   Procedure: INSERTION PORT-A-CATH WITH ULTRASOUND;  Surgeon: Alphonsa Overall, MD;  Location: [facility];  Service: General;  Laterality: Left;  Left Subclavian vein  . WISDOM TOOTH EXTRACTION      FAMILY HISTORY: Family History  Problem Relation Age of Onset  . Kidney disease Maternal Grandmother   . Breast cancer Maternal Grandmother 28       metastatic to liver; d. 58  . Kidney disease Maternal Grandfather   . Kidney disease Paternal Grandmother   . Diabetes Paternal Grandmother   . Breast cancer Paternal Grandmother   . Kidney disease Paternal Grandfather   . Breast cancer Paternal Aunt   . Cerebral palsy Child   . Breast cancer Paternal Great-grandmother   . Pancreatic cancer Other        PGMs brother   . Throat cancer Paternal Uncle   . Breast cancer Other        PGMs sister   (Updated 04/25/2019) Terri Estes's father is living at age 48. Patients' mother is also living at age 78. Marland Kitchen[place]ania notes, however, that she was raised by her grandmother.) The patient has 0 brothers and 5 sisters. Patient denies anyone in her family having ovarian, prostate, or pancreatic cancer. Her maternal grandmother was diagnosed with breast cancer, unsure what age. On her father's side, she reports her grandmother had cysts in her breasts, her great-aunt might have had breast cancer, and her great-grandmother was diagnosed with breast cancer at an older age.   GYNECOLOGIC HISTORY:  No LMP recorded. (Menstrual status: Chemotherapy).  She had an emergent C-section in 09/23/2018 for abruptio placenta at 30 weeks pregnancy, also has a history of eclampsia Menarche:    years old Age at first live birth: 34 years old [place] P: 3 LMP: 04/18/2019 Contraceptive: On goserelin Hysterectomy?: no BSO?: no   SOCIAL HISTORY: (Current as of August 2020) [name] is currently unemployed. She previously worked at [employer]. She is engaged. Fiance [name] runs a Midwife and works other odd jobs.  The patient recently moved to [place].  Daughter Demetrius Charity, age 58, was abused by her father at 22 months and has cerebral palsy. Son [name], age 43, has severe asthma and is developmentally disabled. Daughter Yetta Glassman was born on 09/23/2018 prematurely  and remains on oxygen at home.  The patient's aunt has been staying with her and is helping with the children and providing general support   ADVANCED DIRECTIVES: not in place.    HEALTH MAINTENANCE: Social History   Tobacco Use  . Smoking status: Current Every Day Smoker    Packs/day: 0.10    Years: 0.00    Pack years: 0.00    Types: Cigarettes  . Smokeless tobacco: Never Used  . Tobacco comment: trying to quit - smokes 2 cigarettes/day  Substance Use Topics  . Alcohol use: No  . Drug use: Yes    Frequency: 7.0 times per week    Types:  Marijuana    Comment: subutex last dose 10/15/19, last dose Sun 10/14/19    Colonoscopy: n/a  PAP: 05/10/2018  Bone density: never done Mammography: first performed following abnormal CT  No Known Allergies   Current Outpatient Medications  Medication Sig Dispense Refill  . acyclovir ointment (ZOVIRAX) 5 % Apply 1 application topically 2 (two) times daily as needed. Mix 1 part acyclovir cream with 1 part over the counter hydrocortisone 1% cream and apply to affected area two times a day as needed 5 g 0  . albuterol (PROVENTIL HFA;VENTOLIN HFA) 108 (90 Base) MCG/ACT inhaler Inhale 2 puffs into the lungs 4 (four) times daily. 1 Inhaler 2  . buprenorphine (SUBUTEX) 8 MG SUBL SL tablet Place 8 mg under the tongue 2 (two) times daily.     . cholestyramine (QUESTRAN) 4 g packet Take 1 packet (4 g total) by mouth 3 (three) times daily with meals. 60 each 12  . cyclobenzaprine (FLEXERIL) 10 MG tablet Take 1 tablet (10 mg total) by mouth 3 (three) times daily as needed for muscle spasms. 90 tablet 2  . folic acid (FOLVITE) 1 MG tablet Take 1 tablet (1 mg total) by mouth daily. 60 tablet 3  . gabapentin (NEURONTIN) 300 MG capsule TAKE 1 CAPSULE(300 MG) BY MOUTH THREE TIMES DAILY (Patient taking differently: Take 300 mg by mouth 3 (three) times daily. ) 90 capsule 0  . hydrocortisone (ANUSOL-HC) 2.5 % rectal cream Place 1 application rectally 2 (two) times daily. 60 g 0  . ketoconazole (NIZORAL) 2 % cream Apply 1 application topically daily. 15 g 0  . metoCLOPramide (REGLAN) 10 MG tablet Take 1 tablet (10 mg total) by mouth every 8 (eight) hours as needed for nausea. 30 tablet 0  . omeprazole (PRILOSEC) 40 MG capsule Take 1 capsule (40 mg total) by mouth daily. 30 capsule 5  . ondansetron (ZOFRAN) 8 MG tablet Take 1 tablet (8 mg total) by mouth every 8 (eight) hours as needed for nausea or vomiting. 20 tablet 0  . PARoxetine (PAXIL) 10 MG tablet Take 1 tablet (10 mg total) by mouth daily. (Patient not  taking: Reported on 11/15/2019) 30 tablet 5  . Prenatal MV-Min-FA-Omega-3 (PRENATAL GUMMIES/DHA & FA) 0.4-32.5 MG CHEW Chew 2 each by mouth daily.    . promethazine (PHENERGAN) 25 MG suppository Place 1 suppository (25 mg total) rectally every 6 (six) hours as needed for nausea or vomiting. 12 each 0  . valACYclovir (VALTREX) 1000 MG tablet Take 1 tablet (1,000 mg total) by mouth 2 (two) times daily. 60 tablet 0   No current facility-administered medications for this visit.   Facility-Administered Medications Ordered in Other Visits  Medication Dose Route Frequency Provider Last Rate Last Admin  . 0.9 % NaCl with KCl 20 mEq/ L  infusion   Intravenous Once  Aleane Wesenberg, [name], MD      . sodium chloride flush (NS) 0.9 % injection 10 mL  10 mL Intracatheter PRN Merle Whitehorn, [name], MD        OBJECTIVE: Young African-American woman   Vitals:   01/03/20 1309  BP: 119/84  Pulse: (!) 109  Resp: 20  Temp: 97.9 F (36.6 C)  SpO2: 100%   Wt Readings from Last 3 Encounters:  01/03/20 160 lb 14.4 oz (73 kg)  12/13/19 172 lb 11.2 oz (78.3 kg)  11/29/19 175 lb 8 oz (79.6 kg)   Body mass index is 26.78 kg/m.    ECOG FS:1 - Symptomatic but completely ambulatory  Sclerae unicteric, EOMs intact Wearing a mask No cervical or supraclavicular adenopathy Lungs no rales or rhonchi Heart regular rate and rhythm Abd soft, nontender, positive bowel sounds MSK no focal spinal tenderness, no upper extremity lymphedema Neuro: nonfocal, well oriented, appropriate affect Breasts: The right chest wall is imaged below.  There are a couple of areas of concern which look a little bit brown/gray in the lower picture and will need to be followed. Skin: Overall there is less desquamation than before but of course still significant psoriatic activity.   Right breast area 01/02/2019    Skin 11/09/2019    LAB RESULTS:  CMP     Component Value Date/Time   NA 143 12/13/2019 1223   NA 137 05/10/2018  1535   K 3.4 (L) 12/13/2019 1223   CL 105 12/13/2019 1223   CO2 26 12/13/2019 1223   GLUCOSE 105 (H) 12/13/2019 1223   BUN 7 12/13/2019 1223   BUN 7 05/10/2018 1535   CREATININE 0.85 12/13/2019 1223   CREATININE 0.81 07/03/2019 0919   CALCIUM 9.2 12/13/2019 1223   PROT 7.2 12/13/2019 1223   PROT 7.2 05/10/2018 1535   ALBUMIN 3.8 12/13/2019 1223   ALBUMIN 4.2 05/10/2018 1535   AST 45 (H) 12/13/2019 1223   AST 54 (H) 07/03/2019 0919   ALT 32 12/13/2019 1223   ALT 102 (H) 07/03/2019 0919   ALKPHOS 80 12/13/2019 1223   BILITOT 0.3 12/13/2019 1223   BILITOT <0.2 (L) 07/03/2019 0919   GFRNONAA >60 12/13/2019 1223   GFRNONAA >60 07/03/2019 0919   GFRAA >60 12/13/2019 1223   GFRAA >60 07/03/2019 0919    No results found for: TOTALPROTELP, ALBUMINELP, A1GS, A2GS, BETS, BETA2SER, GAMS, MSPIKE, SPEI  No results found for: KPAFRELGTCHN, LAMBDASER, KAPLAMBRATIO  Lab Results  Component Value Date   WBC 4.7 12/13/2019   NEUTROABS 3.3 12/13/2019   HGB 10.8 (L) 12/13/2019   HCT 33.5 (L) 12/13/2019   MCV 92.8 12/13/2019   PLT 264 12/13/2019    No results found for: LABCA2  No components found for: [id number]  No results for input(s): INR in the last 168 hours.  No results found for: LABCA2  No results found for: YQI347  No results found for: QQV956  No results found for: LOV564  No results found for: CA2729  No components found for: HGQUANT  No results found for: CEA1 / No results found for: CEA1   No results found for: AFPTUMOR  No results found for: [facility]  Lab Results  Component Value Date   HGBA 97.4 12/16/2016   (Hemoglobinopathy evaluation)   No results found for: LDH  No results found for: IRON, TIBC, IRONPCTSAT (Iron and TIBC)  Lab Results  Component Value Date   FERRITIN 900 (H) 09/12/2019    Urinalysis    Component Value  Date/Time   COLORURINE YELLOW 11/29/2019 0138   APPEARANCEUR HAZY (A) 11/29/2019 0138   LABSPEC 1.020 11/29/2019 0138    PHURINE 6.0 11/29/2019 0138   GLUCOSEU NEGATIVE 11/29/2019 0138   HGBUR NEGATIVE 11/29/2019 0138   BILIRUBINUR NEGATIVE 11/29/2019 0138   KETONESUR 5 (A) 11/29/2019 0138   PROTEINUR NEGATIVE 11/29/2019 0138   UROBILINOGEN 4.0 (H) 01/13/2018 1417   NITRITE NEGATIVE 11/29/2019 0138   LEUKOCYTESUR MODERATE (A) 11/29/2019 0138     STUDIES:  No results found.   ELIGIBLE FOR AVAILABLE RESEARCH PROTOCOL: no   ASSESSMENT: 34 y.o. [place].  woman status post right breast biopsy x2 and right axillary lymph node biopsy 04/19/2019 for a clinical T4 N2 MX, stage IIIC invasive ductal carcinoma, grade 3, triple negative, with an MIB-1 of 90%  (a) chest CT scan 04/11/2019 shows multiple subcentimeter lung nodules  (b) PET scan denied by insurance  (c) CT A/P and bone scan due 05/09/2019 show a very small low-density lesion in the lateral segment of the left liver which is indeterminate, and sclerosis of the right SI joint, which is felt to be likely degenerative; bone scan was entirely negative.  (c) baseline breast MRI 04/30/2019 shows involvement of all 4 quadrants in the right breast as well as right axillary subpectoral and internal mammary lymphadenopathy.  Left breast was unremarkable  (1) neoadjuvant chemotherapy consisting of  (a) pembrolizumab started 05/03/2019, third dose delayed because of elevated liver function tests, resumed 08/02/2019  (b) paclitaxel weekly x12 with carboplatin every 21 days x 4, started 05/08/2019   (i) tolerated carboplatin AUC 5-day 1 poorly, switched to AUC 2 on day 1 day 8   (ii) cycle 2 delayed 1 week because of elevated liver function tests   (iii) carboplatin and paclitaxel discontinued after 07/03/2019 because of neuropathy, (received 7 weekly doses paclitaxel and one AUC5 carbo dose, four AUC2 doses)  (c) cyclophosphamide and doxorubicin x4 started 07/03/2019, completed 08/29/2019   (i) 2d dose given day 28 due to poor tolerance; other doses Q 21  days   (2) s/p right mastectomy and ALND 10/16/2019 showing a residual ypT2 ypN2 invasive ductal carcinoma, grade 3, with negative margins  (a) a total of 10 lymph nodes were removed, 4 with macrometastases, 1 with a micrometastasis  (3) adjuvant radiation to follow, consider capecitabine sensitization  (4) genetics testing  (a) genetic testing 05/14/2019 through the Common Hereditary Gene Panel offered by [facility] found no deleterious mutations in APC, ATM, AXIN2, BARD1, BMPR1A, BRCA1, BRCA2, BRIP1, CDH1, CDK4, CDKN2A (p14ARF), CDKN2A (p16INK4a), CHEK2, CTNNA1, DICER1, EPCAM (Deletion/duplication testing only), GREM1 (promoter region deletion/duplication testing only), KIT, MEN1, MLH1, MSH2, MSH3, MSH6, MUTYH, NBN, NF1, NHTL1, PALB2, PDGFRA, PMS2, POLD1, POLE, PTEN, RAD50, RAD51C, RAD51D, RNF43, SDHB, SDHC, SDHD, SMAD4, SMARCA4. STK11, TP53, TSC1, TSC2, and VHL.  The following genes were evaluated for sequence changes only: SDHA and HOXB13 c.251G>A variant only  (5) goserelin started 05/08/2019  (6) pembrolizumab started 08/02/2019, discontinued after fourth dose (10/02/2019) with psoriasis flare  (7) psoriasis:  (a) weekly methotrexate started 11/10/2019, held after 11/17/2019 dose so as not to overlap with radiation  (b) intravenous cyclophosphamide 1 g every 3 weeks starting 11/21/2019     PLAN: [name] finds it difficult to keep her appointments and be on time.  She was supposed to be here around noon but showed up after 1 PM and it was very difficult to get her seen, get labs, and get her treated.   She is due for goserelin.  I will try to get that added for 01/06/2019 when she should be coming in for her simulation.  Her psoriasis is still very uncomfortable for her but it is looking a little better to me.  She is interested in the products being advertised on TV for psoriasis.  We do not have those here.  We will try to get her an appointment with the dermatology group at [facility] where  she says she was seen remotely when she was a little girl.  There are no records of this in care everywhere at this point.  We discussed the fact that she really does need to get on with her radiation treatments if she does not want to have local recurrence of her disease which would make everything much more complicated.  We were not able to add her simulation today.  She was scheduled for Monday as noted above.  Otherwise the plan at this point is to get her through radiation, switch from cyclophosphamide to oral methotrexate once radiation is done, and initiate long-term follow-up.  We are continuing the goserelin indefinitely.  Total time this encounter 35 minutes   Keenon Leitzel, [name], MD  01/03/20 1:35 PM Medical Oncology and Hematology [facility] [place], [place] [address] Tel. [phone]    Fax. [phone]   I, Wilburn Mylar, am acting as scribe for Dr. [name]. Amye Grego.  I, Lurline Del MD, have reviewed the above documentation for accuracy and completeness, and I agree with the above.

## 2020-01-03 ENCOUNTER — Encounter: Payer: Self-pay | Admitting: *Deleted

## 2020-01-03 ENCOUNTER — Telehealth: Payer: Self-pay | Admitting: *Deleted

## 2020-01-03 ENCOUNTER — Inpatient Hospital Stay: Payer: Medicaid Other | Admitting: General Practice

## 2020-01-03 ENCOUNTER — Inpatient Hospital Stay: Payer: Medicaid Other | Attending: Oncology

## 2020-01-03 ENCOUNTER — Inpatient Hospital Stay (HOSPITAL_BASED_OUTPATIENT_CLINIC_OR_DEPARTMENT_OTHER): Payer: Medicaid Other | Admitting: Oncology

## 2020-01-03 ENCOUNTER — Inpatient Hospital Stay: Payer: Medicaid Other

## 2020-01-03 ENCOUNTER — Other Ambulatory Visit: Payer: Self-pay

## 2020-01-03 VITALS — BP 119/84 | HR 109 | Temp 97.9°F | Resp 20 | Ht 65.0 in | Wt 160.9 lb

## 2020-01-03 DIAGNOSIS — R918 Other nonspecific abnormal finding of lung field: Secondary | ICD-10-CM | POA: Insufficient documentation

## 2020-01-03 DIAGNOSIS — L408 Other psoriasis: Secondary | ICD-10-CM | POA: Diagnosis not present

## 2020-01-03 DIAGNOSIS — Z171 Estrogen receptor negative status [ER-]: Secondary | ICD-10-CM | POA: Insufficient documentation

## 2020-01-03 DIAGNOSIS — Z800 Family history of malignant neoplasm of digestive organs: Secondary | ICD-10-CM | POA: Diagnosis not present

## 2020-01-03 DIAGNOSIS — C50811 Malignant neoplasm of overlapping sites of right female breast: Secondary | ICD-10-CM | POA: Insufficient documentation

## 2020-01-03 DIAGNOSIS — Z5111 Encounter for antineoplastic chemotherapy: Secondary | ICD-10-CM | POA: Diagnosis not present

## 2020-01-03 DIAGNOSIS — L409 Psoriasis, unspecified: Secondary | ICD-10-CM | POA: Diagnosis not present

## 2020-01-03 DIAGNOSIS — Z820 Family history of epilepsy and other diseases of the nervous system: Secondary | ICD-10-CM | POA: Insufficient documentation

## 2020-01-03 DIAGNOSIS — G629 Polyneuropathy, unspecified: Secondary | ICD-10-CM | POA: Insufficient documentation

## 2020-01-03 DIAGNOSIS — Z833 Family history of diabetes mellitus: Secondary | ICD-10-CM | POA: Diagnosis not present

## 2020-01-03 DIAGNOSIS — Z841 Family history of disorders of kidney and ureter: Secondary | ICD-10-CM | POA: Diagnosis not present

## 2020-01-03 DIAGNOSIS — G620 Drug-induced polyneuropathy: Secondary | ICD-10-CM | POA: Diagnosis not present

## 2020-01-03 DIAGNOSIS — Z79899 Other long term (current) drug therapy: Secondary | ICD-10-CM | POA: Insufficient documentation

## 2020-01-03 DIAGNOSIS — C773 Secondary and unspecified malignant neoplasm of axilla and upper limb lymph nodes: Secondary | ICD-10-CM | POA: Insufficient documentation

## 2020-01-03 DIAGNOSIS — T451X5A Adverse effect of antineoplastic and immunosuppressive drugs, initial encounter: Secondary | ICD-10-CM

## 2020-01-03 DIAGNOSIS — M542 Cervicalgia: Secondary | ICD-10-CM | POA: Insufficient documentation

## 2020-01-03 DIAGNOSIS — Z808 Family history of malignant neoplasm of other organs or systems: Secondary | ICD-10-CM | POA: Insufficient documentation

## 2020-01-03 DIAGNOSIS — F17210 Nicotine dependence, cigarettes, uncomplicated: Secondary | ICD-10-CM | POA: Insufficient documentation

## 2020-01-03 DIAGNOSIS — R609 Edema, unspecified: Secondary | ICD-10-CM | POA: Diagnosis not present

## 2020-01-03 DIAGNOSIS — F1290 Cannabis use, unspecified, uncomplicated: Secondary | ICD-10-CM | POA: Insufficient documentation

## 2020-01-03 DIAGNOSIS — Z803 Family history of malignant neoplasm of breast: Secondary | ICD-10-CM | POA: Insufficient documentation

## 2020-01-03 DIAGNOSIS — Z95828 Presence of other vascular implants and grafts: Secondary | ICD-10-CM

## 2020-01-03 LAB — CBC WITH DIFFERENTIAL/PLATELET
Abs Immature Granulocytes: 0.02 10*3/uL (ref 0.00–0.07)
Basophils Absolute: 0 10*3/uL (ref 0.0–0.1)
Basophils Relative: 1 %
Eosinophils Absolute: 0.2 10*3/uL (ref 0.0–0.5)
Eosinophils Relative: 3 %
HCT: 37.9 % (ref 36.0–46.0)
Hemoglobin: 12.4 g/dL (ref 12.0–15.0)
Immature Granulocytes: 0 %
Lymphocytes Relative: 17 %
Lymphs Abs: 1.1 10*3/uL (ref 0.7–4.0)
MCH: 29.8 pg (ref 26.0–34.0)
MCHC: 32.7 g/dL (ref 30.0–36.0)
MCV: 91.1 fL (ref 80.0–100.0)
Monocytes Absolute: 0.7 10*3/uL (ref 0.1–1.0)
Monocytes Relative: 10 %
Neutro Abs: 4.7 10*3/uL (ref 1.7–7.7)
Neutrophils Relative %: 69 %
Platelets: 340 10*3/uL (ref 150–400)
RBC: 4.16 MIL/uL (ref 3.87–5.11)
RDW: 15.4 % (ref 11.5–15.5)
WBC: 6.7 10*3/uL (ref 4.0–10.5)
nRBC: 0 % (ref 0.0–0.2)

## 2020-01-03 LAB — COMPREHENSIVE METABOLIC PANEL
ALT: 9 U/L (ref 0–44)
AST: 17 U/L (ref 15–41)
Albumin: 4.2 g/dL (ref 3.5–5.0)
Alkaline Phosphatase: 85 U/L (ref 38–126)
Anion gap: 14 (ref 5–15)
BUN: 7 mg/dL (ref 6–20)
CO2: 29 mmol/L (ref 22–32)
Calcium: 9.3 mg/dL (ref 8.9–10.3)
Chloride: 100 mmol/L (ref 98–111)
Creatinine, Ser: 1.06 mg/dL — ABNORMAL HIGH (ref 0.44–1.00)
GFR calc Af Amer: >60 mL/min (ref >60)
GFR calc non Af Amer: >60 mL/min (ref >60)
Glucose, Bld: 128 mg/dL — ABNORMAL HIGH (ref 70–99)
Potassium: 3.4 mmol/L — ABNORMAL LOW (ref 3.5–5.1)
Sodium: 143 mmol/L (ref 135–145)
Total Bilirubin: 0.3 mg/dL (ref 0.3–1.2)
Total Protein: 8.1 g/dL (ref 6.5–8.1)

## 2020-01-03 MED ORDER — SODIUM CHLORIDE 0.9 % IV SOLN
Freq: Once | INTRAVENOUS | Status: DC
  Filled 2020-01-03: qty 250

## 2020-01-03 MED ORDER — SODIUM CHLORIDE 0.9 % IV SOLN
Freq: Once | INTRAVENOUS | Status: AC
  Filled 2020-01-03: qty 250

## 2020-01-03 MED ORDER — SODIUM CHLORIDE 0.9% FLUSH
10.0000 mL | Freq: Once | INTRAVENOUS | Status: AC
  Administered 2020-01-03: 10 mL
  Filled 2020-01-03: qty 10

## 2020-01-03 MED ORDER — HEPARIN SOD (PORK) LOCK FLUSH 100 UNIT/ML IV SOLN
500.0000 [IU] | Freq: Once | INTRAVENOUS | Status: AC
  Administered 2020-01-03: 500 [IU]
  Filled 2020-01-03: qty 5

## 2020-01-03 MED ORDER — SODIUM CHLORIDE 0.9 % IV SOLN
1000.0000 mg | Freq: Once | INTRAVENOUS | Status: AC
  Administered 2020-01-03: 17:00:00 1000 mg via INTRAVENOUS
  Filled 2020-01-03: qty 50

## 2020-01-03 MED ORDER — ALTEPLASE 2 MG IJ SOLR
INTRAMUSCULAR | Status: AC
  Filled 2020-01-03: qty 2

## 2020-01-03 MED ORDER — PROCHLORPERAZINE EDISYLATE 10 MG/2ML IJ SOLN: 10.0000 mg | Freq: Once | INTRAMUSCULAR | Status: DC

## 2020-01-03 MED ORDER — PROCHLORPERAZINE MALEATE 10 MG PO TABS
10.0000 mg | ORAL_TABLET | Freq: Once | ORAL | Status: AC
  Administered 2020-01-03: 10 mg via ORAL

## 2020-01-03 MED ORDER — SODIUM CHLORIDE 0.9% FLUSH
10.0000 mL | INTRAVENOUS | Status: DC | PRN
  Administered 2020-01-03: 10 mL via INTRAVENOUS
  Filled 2020-01-03: qty 10

## 2020-01-03 MED ORDER — ALTEPLASE 2 MG IJ SOLR
2.0000 mg | Freq: Once | INTRAMUSCULAR | Status: AC | PRN
  Administered 2020-01-03: 2 mg
  Filled 2020-01-03: qty 2

## 2020-01-03 MED ORDER — DEXAMETHASONE SODIUM PHOSPHATE 10 MG/ML IJ SOLN: 4.0000 mg | Freq: Once | INTRAMUSCULAR | Status: DC

## 2020-01-03 MED ORDER — PROCHLORPERAZINE MALEATE 10 MG PO TABS
ORAL_TABLET | ORAL | Status: AC
  Filled 2020-01-03: qty 1

## 2020-01-03 NOTE — Addendum Note (Signed)
Addended by: Wylene Men on: 01/03/2020 05:29 PM   Modules accepted: Orders

## 2020-01-03 NOTE — Patient Instructions (Signed)
[facility] Discharge Instructions for Patients Receiving Chemotherapy  Today you received the following chemotherapy agents: Cytoxan.  To help prevent nausea and vomiting after your treatment, we encourage you to take your nausea medication as directed.   If you develop nausea and vomiting that is not controlled by your nausea medication, call the clinic.   BELOW ARE SYMPTOMS THAT SHOULD BE REPORTED IMMEDIATELY:  *FEVER GREATER THAN 100.5 F  *CHILLS WITH OR WITHOUT FEVER  NAUSEA AND VOMITING THAT IS NOT CONTROLLED WITH YOUR NAUSEA MEDICATION  *UNUSUAL SHORTNESS OF BREATH  *UNUSUAL BRUISING OR BLEEDING  TENDERNESS IN MOUTH AND THROAT WITH OR WITHOUT PRESENCE OF ULCERS  *URINARY PROBLEMS  *BOWEL PROBLEMS  UNUSUAL RASH Items with * indicate a potential emergency and should be followed up as soon as possible.  Feel free to call the clinic should you have any questions or concerns. The clinic phone number is [phone].  Please show the [facility] at check-in to the Emergency Department and triage nurse.  Rib Contusion A rib contusion is a deep bruise on your rib area. Contusions are the result of a blunt trauma that causes bleeding and injury to the tissues under the skin. A rib contusion may involve bruising of the ribs and of the skin and muscles in the area. The skin over the contusion may turn blue, purple, or yellow. Minor injuries will give you a painless contusion. More severe contusions may stay painful and swollen for a few weeks. What are the causes? This condition is usually caused by a blow, trauma, or direct force to an area of the body. This often occurs while playing contact sports. What are the signs or symptoms? Symptoms of this condition include:  Swelling and redness of the injured area.  Discoloration of the injured area.  Tenderness and soreness of the injured area.  Pain with or without movement. How is this diagnosed? This  condition may be diagnosed based on:  Your symptoms and medical history.  A physical exam.  Imaging tests--such as an X-ray, CT scan, or MRI--to determine if there were internal injuries or broken bones (fractures). How is this treated? This condition may be treated with:  Rest. This is often the best treatment for a rib contusion.  Icing. This reduces swelling and inflammation.  Deep-breathing exercises. These may be recommended to reduce the risk for lung collapse and pneumonia.  Medicines. Over-the-counter or prescription medicines may be given to control pain.  Injection of a numbing medicine around the nerve near your injury (nerve block). Follow these instructions at home:     Medicines  Take over-the-counter and prescription medicines only as told by your health care provider.  Do not drive or use heavy machinery while taking prescription pain medicine.  If you are taking prescription pain medicine, take actions to prevent or treat constipation. Your health care provider may recommend that you: ? Drink enough fluid to keep your urine pale yellow. ? Eat foods that are high in fiber, such as fresh fruits and vegetables, whole grains, and beans. ? Limit foods that are high in fat and processed sugars, such as fried or sweet foods. ? Take an over-the-counter or prescription medicine for constipation. Managing pain, stiffness, and swelling  If directed, put ice on the injured area: ? Put ice in a plastic bag. ? Place a towel between your skin and the bag. ? Leave the ice on for 20 minutes, 2-3 times a day.  Rest the injured  area. Avoid strenuous activity and any activities or movements that cause pain. Be careful during activities and avoid bumping the injured area.  Do not lift anything that is heavier than 5 lb (2.3 kg), or the limit that you are told, until your health care provider says that it is safe. General instructions  Do not use any products that contain  nicotine or tobacco, such as cigarettes and e-cigarettes. These can delay healing. If you need help quitting, ask your health care provider.  Do deep-breathing exercises as told by your health care provider.  If you were given an incentive spirometer, use it every 1-2 hours while you are awake, or as recommended by your health care provider. This device measures how well you are filling your lungs with each breath.  Keep all follow-up visits as told by your health care provider. This is important. Contact a health care provider if you have:  Increased bruising or swelling.  Pain that is not controlled with treatment.  A fever. Get help right away if you:  Have difficulty breathing or shortness of breath.  Develop a continual cough or you cough up thick or bloody sputum.  Feel nauseous or you vomit.  Have pain in your abdomen. Summary  A rib contusion is a deep bruise on your rib area. Contusions are the result of a blunt trauma that causes bleeding and injury to the tissues under the skin.  The skin overlying the contusion may turn blue, purple, or yellow. Minor injuries may give you a painless contusion. More severe contusions may stay painful and swollen for a few weeks.  Rest the injured area. Avoid strenuous activity and any activities or movements that cause pain. This information is not intended to replace advice given to you by your health care provider. Make sure you discuss any questions you have with your health care provider. Document Revised: 01/11/2018 Document Reviewed: 01/11/2018 Elsevier Patient Education  2020 [employer].

## 2020-01-03 NOTE — Addendum Note (Signed)
Addended by: Henreitta Leber E on: 01/03/2020 04:12 PM   Modules accepted: Orders

## 2020-01-03 NOTE — Progress Notes (Signed)
CATHFLO given at 1453 by Sherre Scarlet, RN. Sent patient to the lab for blood draw.

## 2020-01-03 NOTE — Progress Notes (Signed)
At 1555, RN noted blood return from port and removed approximately 3cc of cathflo from port. No c/o or S/S of discomfort or distress.

## 2020-01-03 NOTE — Telephone Encounter (Signed)
Left voicemail to remind patient of her appointment today with Dr. Jana Hakim.  Also left message to call Dr. Ida Rogue office or schedule her appointments while she is there today.

## 2020-01-03 NOTE — Telephone Encounter (Signed)
This RN called to Rad/Onc and obtained an appointment for Simulation for Monday 1/11 at 1230 pm.  Note pt was put on the books for 10 am post message from Navigator requesting an appointment but did not confirm she would be able to come.

## 2020-01-03 NOTE — Patient Instructions (Signed)

## 2020-01-03 NOTE — Addendum Note (Signed)
Addended by: Wylene Men on: 01/03/2020 04:19 PM   Modules accepted: Orders

## 2020-01-04 ENCOUNTER — Telehealth: Payer: Self-pay | Admitting: *Deleted

## 2020-01-04 LAB — TSH: TSH: 0.306 u[IU]/mL — ABNORMAL LOW (ref 0.308–3.960)

## 2020-01-04 NOTE — Telephone Encounter (Signed)
LM with note below 

## 2020-01-04 NOTE — Telephone Encounter (Signed)
-----   Message from Gardenia Phlegm, NP sent at 01/04/2020  9:34 AM EST ----- Please call patient about results.  Kidney function slightly elevated, potassium slightly decreased.  Recommend she increase fluid intake and potassium rich foods.    Thanks, LC ----- Message ----- From: Buel Ream, Lab [facility] [place] Sent: 01/03/2020   3:20 PM EST To: Chauncey Cruel, MD

## 2020-01-04 NOTE — Telephone Encounter (Signed)
Referral faxed to [facility]

## 2020-01-07 ENCOUNTER — Ambulatory Visit
Admission: RE | Admit: 2020-01-07 | Discharge: 2020-01-07 | Disposition: A | Discharge status: Home / Self Care | Payer: Medicaid Other | Source: Ambulatory Visit | Attending: Radiation Oncology | Admitting: Radiation Oncology

## 2020-01-07 ENCOUNTER — Other Ambulatory Visit: Payer: Self-pay

## 2020-01-07 ENCOUNTER — Inpatient Hospital Stay: Payer: Medicaid Other

## 2020-01-07 ENCOUNTER — Encounter: Payer: Self-pay | Admitting: *Deleted

## 2020-01-07 ENCOUNTER — Telehealth: Payer: Self-pay

## 2020-01-07 ENCOUNTER — Other Ambulatory Visit: Payer: Self-pay | Admitting: Oncology

## 2020-01-07 VITALS — BP 112/76 | HR 105 | Temp 98.1°F | Resp 18

## 2020-01-07 DIAGNOSIS — C50811 Malignant neoplasm of overlapping sites of right female breast: Secondary | ICD-10-CM | POA: Insufficient documentation

## 2020-01-07 DIAGNOSIS — Z171 Estrogen receptor negative status [ER-]: Secondary | ICD-10-CM | POA: Diagnosis not present

## 2020-01-07 DIAGNOSIS — Z5111 Encounter for antineoplastic chemotherapy: Secondary | ICD-10-CM | POA: Diagnosis not present

## 2020-01-07 DIAGNOSIS — C50911 Malignant neoplasm of unspecified site of right female breast: Secondary | ICD-10-CM

## 2020-01-07 MED ORDER — GOSERELIN ACETATE 3.6 MG ~~LOC~~ IMPL
3.6000 mg | DRUG_IMPLANT | Freq: Once | SUBCUTANEOUS | Status: AC
  Administered 2020-01-07: 15:00:00 3.6 mg via SUBCUTANEOUS

## 2020-01-07 MED ORDER — GOSERELIN ACETATE 3.6 MG ~~LOC~~ IMPL
DRUG_IMPLANT | SUBCUTANEOUS | Status: AC
  Filled 2020-01-07: qty 3.6

## 2020-01-07 MED ORDER — PROMETHAZINE-CODEINE 6.25-10 MG/5ML PO SYRP: 5.0000 mL | ORAL_SOLUTION | Freq: Two times a day (BID) | ORAL | 0 refills | Status: DC | PRN

## 2020-01-07 MED FILL — PROMETHAZINE W/COD SYRUP: 6.25-10 | 12 days supply | Qty: 120 | Fill #0

## 2020-01-07 NOTE — Telephone Encounter (Signed)
Demographics and insurance information faxed to [place] at [facility] at [phone] per request

## 2020-01-10 ENCOUNTER — Ambulatory Visit: Payer: Medicaid Other

## 2020-01-16 NOTE — Telephone Encounter (Signed)
No entry 

## 2020-01-22 ENCOUNTER — Ambulatory Visit: Payer: Medicaid Other

## 2020-01-22 ENCOUNTER — Ambulatory Visit: Payer: Medicaid Other | Admitting: Radiation Oncology

## 2020-01-22 DIAGNOSIS — C50811 Malignant neoplasm of overlapping sites of right female breast: Secondary | ICD-10-CM | POA: Diagnosis not present

## 2020-01-23 ENCOUNTER — Encounter: Payer: Self-pay | Admitting: *Deleted

## 2020-01-23 ENCOUNTER — Ambulatory Visit
Admission: RE | Admit: 2020-01-23 | Discharge: 2020-01-23 | Disposition: A | Discharge status: Home / Self Care | Payer: Medicaid Other | Source: Ambulatory Visit | Attending: Radiation Oncology | Admitting: Radiation Oncology

## 2020-01-23 ENCOUNTER — Other Ambulatory Visit: Payer: Self-pay

## 2020-01-23 DIAGNOSIS — C50811 Malignant neoplasm of overlapping sites of right female breast: Secondary | ICD-10-CM | POA: Diagnosis not present

## 2020-01-24 ENCOUNTER — Inpatient Hospital Stay: Payer: Medicaid Other

## 2020-01-24 ENCOUNTER — Inpatient Hospital Stay (HOSPITAL_BASED_OUTPATIENT_CLINIC_OR_DEPARTMENT_OTHER): Payer: Medicaid Other | Admitting: Adult Health

## 2020-01-24 ENCOUNTER — Encounter: Payer: Self-pay | Admitting: Adult Health

## 2020-01-24 ENCOUNTER — Other Ambulatory Visit: Payer: Self-pay

## 2020-01-24 ENCOUNTER — Ambulatory Visit
Admission: RE | Admit: 2020-01-24 | Discharge: 2020-01-24 | Disposition: A | Discharge status: Home / Self Care | Payer: Medicaid Other | Source: Ambulatory Visit | Attending: Radiation Oncology | Admitting: Radiation Oncology

## 2020-01-24 VITALS — BP 115/87 | HR 96 | Temp 97.5°F | Resp 18 | Ht 65.0 in | Wt 159.3 lb

## 2020-01-24 DIAGNOSIS — Z171 Estrogen receptor negative status [ER-]: Secondary | ICD-10-CM

## 2020-01-24 DIAGNOSIS — C50811 Malignant neoplasm of overlapping sites of right female breast: Secondary | ICD-10-CM

## 2020-01-24 DIAGNOSIS — J189 Pneumonia, unspecified organism: Secondary | ICD-10-CM

## 2020-01-24 DIAGNOSIS — Z5111 Encounter for antineoplastic chemotherapy: Secondary | ICD-10-CM | POA: Diagnosis not present

## 2020-01-24 LAB — COMPREHENSIVE METABOLIC PANEL
ALT: 12 U/L (ref 0–44)
AST: 12 U/L — ABNORMAL LOW (ref 15–41)
Albumin: 3.2 g/dL — ABNORMAL LOW (ref 3.5–5.0)
Alkaline Phosphatase: 75 U/L (ref 38–126)
Anion gap: 9 (ref 5–15)
BUN: 8 mg/dL (ref 6–20)
CO2: 30 mmol/L (ref 22–32)
Calcium: 8.9 mg/dL (ref 8.9–10.3)
Chloride: 103 mmol/L (ref 98–111)
Creatinine, Ser: 0.84 mg/dL (ref 0.44–1.00)
GFR calc Af Amer: >60 mL/min (ref >60)
GFR calc non Af Amer: >60 mL/min (ref >60)
Glucose, Bld: 82 mg/dL (ref 70–99)
Potassium: 3.2 mmol/L — ABNORMAL LOW (ref 3.5–5.1)
Sodium: 142 mmol/L (ref 135–145)
Total Bilirubin: 0.3 mg/dL (ref 0.3–1.2)
Total Protein: 6.9 g/dL (ref 6.5–8.1)

## 2020-01-24 LAB — CBC WITH DIFFERENTIAL/PLATELET
Abs Immature Granulocytes: 0.09 10*3/uL — ABNORMAL HIGH (ref 0.00–0.07)
Basophils Absolute: 0 10*3/uL (ref 0.0–0.1)
Basophils Relative: 0 %
Eosinophils Absolute: 0.1 10*3/uL (ref 0.0–0.5)
Eosinophils Relative: 2 %
HCT: 36.6 % (ref 36.0–46.0)
Hemoglobin: 11.6 g/dL — ABNORMAL LOW (ref 12.0–15.0)
Immature Granulocytes: 1 %
Lymphocytes Relative: 12 %
Lymphs Abs: 1 10*3/uL (ref 0.7–4.0)
MCH: 29.1 pg (ref 26.0–34.0)
MCHC: 31.7 g/dL (ref 30.0–36.0)
MCV: 91.7 fL (ref 80.0–100.0)
Monocytes Absolute: 0.8 10*3/uL (ref 0.1–1.0)
Monocytes Relative: 9 %
Neutro Abs: 6.3 10*3/uL (ref 1.7–7.7)
Neutrophils Relative %: 76 %
Platelets: 271 10*3/uL (ref 150–400)
RBC: 3.99 MIL/uL (ref 3.87–5.11)
RDW: 16.9 % — ABNORMAL HIGH (ref 11.5–15.5)
WBC: 8.3 10*3/uL (ref 4.0–10.5)
nRBC: 0 % (ref 0.0–0.2)

## 2020-01-24 LAB — TSH: TSH: 0.115 u[IU]/mL — ABNORMAL LOW (ref 0.308–3.960)

## 2020-01-24 MED ORDER — SODIUM CHLORIDE 0.9 % IV SOLN
1000.0000 mg | Freq: Once | INTRAVENOUS | Status: AC
  Administered 2020-01-24: 1000 mg via INTRAVENOUS
  Filled 2020-01-24: qty 50

## 2020-01-24 MED ORDER — DEXAMETHASONE SODIUM PHOSPHATE 10 MG/ML IJ SOLN
4.0000 mg | Freq: Once | INTRAMUSCULAR | Status: AC
  Administered 2020-01-24: 4 mg via INTRAVENOUS

## 2020-01-24 MED ORDER — PROCHLORPERAZINE EDISYLATE 10 MG/2ML IJ SOLN
10.0000 mg | Freq: Once | INTRAMUSCULAR | Status: AC
  Administered 2020-01-24: 10 mg via INTRAVENOUS

## 2020-01-24 MED ORDER — SODIUM CHLORIDE 0.9% FLUSH
10.0000 mL | Freq: Once | INTRAVENOUS | Status: AC
  Administered 2020-01-24: 10 mL
  Filled 2020-01-24: qty 10

## 2020-01-24 MED ORDER — HEPARIN SOD (PORK) LOCK FLUSH 100 UNIT/ML IV SOLN
500.0000 [IU] | Freq: Once | INTRAVENOUS | Status: AC
  Administered 2020-01-24: 500 [IU]
  Filled 2020-01-24: qty 5

## 2020-01-24 MED ORDER — DEXAMETHASONE SODIUM PHOSPHATE 10 MG/ML IJ SOLN
INTRAMUSCULAR | Status: AC
  Filled 2020-01-24: qty 1

## 2020-01-24 MED ORDER — PROCHLORPERAZINE EDISYLATE 10 MG/2ML IJ SOLN
INTRAMUSCULAR | Status: AC
  Filled 2020-01-24: qty 2

## 2020-01-24 MED ORDER — DOXYCYCLINE HYCLATE 100 MG PO TABS: 100.0000 mg | ORAL_TABLET | Freq: Two times a day (BID) | ORAL | 0 refills | Status: DC

## 2020-01-24 MED ORDER — SODIUM CHLORIDE 0.9 % IV SOLN
Freq: Once | INTRAVENOUS | Status: AC
  Filled 2020-01-24: qty 250

## 2020-01-24 MED ORDER — DEXTROSE 5 % IV SOLN
2.0000 g | Freq: Once | INTRAVENOUS | Status: AC
  Administered 2020-01-24: 2 g via INTRAVENOUS
  Filled 2020-01-24: qty 20

## 2020-01-24 NOTE — Progress Notes (Addendum)
[place]  Telephone:([phone]) [phone] Fax:(336) [phone]    ID: Terri Estes DOB: Jan 30, 1986  MR#: [id number]  GYB#:[id number]  Patient Care Team: Patient, No Pcp Per as PCP - General (General Practice) Terri Overall, MD as Consulting Physician (General Surgery) Terri Estes, Terri Dad, MD as Consulting Physician (Oncology) Terri Rudd, MD as Consulting Physician (Radiation Oncology) Terri Germany, RN as Oncology Nurse Navigator Terri Kaufmann, RN as Oncology Nurse Navigator Terri Jude, MD as Consulting Physician (Obstetrics and Gynecology) Terri Estes, Terri Bathe, MD as Consulting Physician (Internal Medicine) OTHER MD: Terri Estes for [facility] is [phone]   CHIEF COMPLAINT: 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  She will receive a dose of intravenous cyclophosphamide today.    REVIEW OF SYSTEMS: Terri Estes is concerned about swelling on the right side of her neck.  She says it is how she found otu the cancer was in her breast and it has continued.  She has a cough, and that has been going on x 2 months.  She is concerned that the cancer has gone into her lungs. She says her cough is productive of of yellow mucous.  She cannot recall how long it has been going on.  She has ribs and back pain.  She is c/o shortness of breath.  She is having difficulty drinking fluids.  She has not had any fever or chills.  She has been COVID tested.  This was completed in 11/2019.  Terri Estes is not having any nausea or vomiting.  She is not having constipation or diarrhea.  She has headaches, and she thinks this may be related to nasal drainage and blowing.  She started adjuvant radiation yesterday.  Her skin is doing ok.  Her appt with dermatology at [facility] is on  02/25/2020.  Otherwise, a detailed ROS was non contributory today.    HISTORY OF CURRENT ILLNESS: From the original intake note:  Terri Estes presented with swelling along the left side of the face with neck pain. She then underwent a neck CT on 04/11/2019 showing: Enlarged left level 2 lymph nodes with associated inflammatory change including stranding of the adjacent fat and thickening of the platysma. While malignancy is not excluded, this is most concerning for infection. No focal abscess is present. Hypoattenuation within 1 of the left level 2 lymph nodes likely represents central necrosis or suppuration of the node. No primary malignancy or other abscess. Left upper lobe pulmonary nodules may be inflammatory. The largest measures 0.7 x 0.7 x 0.6 cm. CT of the chest with contrast may be useful for further evaluation.  She also underwent a chest CT on the same day showing: Complex right breast mass measuring 5.6 x 4.5 x 4.7 cm consistent with a primary breast carcinoma. Malignant right axillary adenopathy measuring up to 3.1 cm. Multiple bilateral pulmonary nodules are concerning for metastatic disease. Given the right breast mass, the left cervical lymph nodes are more likely malignant.  She then underwent bilateral diagnostic mammography with tomography and right breast ultrasonography at [facility] on 04/19/2019 showing: Breast Density Category C; findings which are highly suspicious for multicentric inflammatory right breast cancer with right axillary nodal metastatic disease; no mammographic evidence of malignancy involving the left breast.  Accordingly on 04/19/2019 she proceeded to biopsy of the right breast area in question.  The pathology from this procedure showed ([id number]): invasive ductal carcinoma, grade III, upper inner quadrant, 12:30 o'clock, 5.0 cm from the nipple. Prognostic indicators significant for: estrogen receptor, 0% negative and progesterone receptor, 0%  negative. Proliferation marker Ki67 at 90%. HER2 negative (0+).  Additional biopsies of the right breast and right axilla were performed on the same day. The pathology from this procedure showed ([id number]): 2. Breast, right, needle core biopsy, satellite mass UOQ, 10 o'clock, 8cmfn  - Invasive ductal carcinoma, grade III 3. Lymph node, needle/core biopsy, level 1 right axilla   - Invasive ductal carcinoma, grade III  The patient's subsequent history is as detailed below.   PAST MEDICAL HISTORY: Past Medical History:  Diagnosis Date  . Anemia    after pregnancy  . Anxiety    severe not taking meds  . Asthma    inhaler  4x per day  . Cancer ([place])   . Chronic pain   . Depression   . Family history of breast cancer   . GERD (gastroesophageal reflux disease)   . History of substance abuse ([place])   . Pregnancy induced hypertension    takes medication now.  . Psoriasis    all over body  . Trichomonas infection   . Vaginal Pap smear, abnormal     PAST SURGICAL HISTORY: Past Surgical History:  Procedure Laterality Date  . CESAREAN SECTION  05/26/2008  . CESAREAN SECTION N/A 07/10/2017   Procedure: CESAREAN SECTION;  Surgeon: Terri Buff, MD;  Location: [facility];  Service: Obstetrics;  Laterality: N/A;  . CESAREAN SECTION N/A 09/23/2018   Procedure: CESAREAN SECTION;  Surgeon: Terri Jude, MD;  Location: [facility];  Service: Obstetrics;  Laterality: N/A;  . COLPOSCOPY    . MASTECTOMY WITH AXILLARY LYMPH NODE DISSECTION Right 10/16/2019   Procedure: RIGHT MASTECTOMY WITH AXILLARY LYMPH NODE DISSECTION;  Surgeon: Terri Overall, MD;  Location: [facility];  Service: General;  Laterality: Right;  . PORTACATH PLACEMENT Left 04/30/2019   Procedure: INSERTION PORT-A-CATH WITH ULTRASOUND;  Surgeon: Terri Overall, MD;  Location: [facility];  Service: General;  Laterality: Left;  Left Subclavian vein  . WISDOM TOOTH EXTRACTION      FAMILY HISTORY: Family History  Problem  Relation Age of Onset  . Kidney disease Maternal Grandmother   . Breast cancer Maternal Grandmother 28       metastatic to liver; d. 53  . Kidney disease Maternal Grandfather   . Kidney disease Paternal Grandmother   . Diabetes Paternal Grandmother   . Breast cancer Paternal Grandmother   . Kidney disease Paternal Grandfather   . Breast cancer Paternal Aunt   . Cerebral palsy Child   . Breast cancer Paternal Great-grandmother   . Pancreatic cancer Other        PGMs brother   . Throat cancer Paternal Uncle   . Breast cancer Other        PGMs sister  (Updated 04/25/2019) Selen's father is living at age 65. Patients' mother is also living at age 66. [place] notes, however, that she was raised by her grandmother.) The patient has 0 brothers and 5 sisters. Patient denies anyone in her family having ovarian, prostate, or pancreatic cancer. Her maternal grandmother was diagnosed with breast cancer, unsure what age. On her father's side, she reports her grandmother had cysts in her breasts, her great-aunt might have had breast cancer, and her great-grandmother was diagnosed with breast cancer at an older age.   GYNECOLOGIC HISTORY:  No LMP recorded. (Menstrual status: Chemotherapy).  She had an emergent C-section in 09/23/2018 for abruptio placenta at 30 weeks pregnancy, also has a history of eclampsia Menarche:    years old Age at first live birth: 34 years old [place] P: 3 LMP: 04/18/2019 Contraceptive: On goserelin Hysterectomy?: no BSO?: no   SOCIAL HISTORY: (Current as of August 2020) Terri Estes is currently unemployed. She previously worked at [employer]. She is engaged. Fiance [name] runs a Midwife and works other odd jobs.  The patient recently moved to [place].  Daughter Demetrius Charity, age 1, was abused by her father at 53 months and has cerebral palsy. Son [name], age 21, has severe asthma and is developmentally disabled. Daughter Yetta Glassman was born on 09/23/2018  prematurely and remains on oxygen at home.  The patient's aunt has been staying with her and is helping with the children and providing general support   ADVANCED DIRECTIVES: not in place.    HEALTH MAINTENANCE: Social History   Tobacco Use  . Smoking status: Current Every Day Smoker    Packs/day: 0.10    Years: 0.00    Pack years: 0.00    Types: Cigarettes  . Smokeless tobacco: Never Used  . Tobacco comment: trying to quit - smokes 2 cigarettes/day  Substance Use Topics  . Alcohol use: No  . Drug use: Yes    Frequency: 7.0 times per week    Types: Marijuana    Comment: subutex last dose 10/15/19, last dose Sun 10/14/19    Colonoscopy: n/a  PAP: 05/10/2018  Bone density: never done Mammography: first performed following abnormal CT  No Known Allergies   Current Outpatient Medications  Medication Sig Dispense Refill  . acyclovir ointment (ZOVIRAX) 5 % Apply 1 application topically 2 (two) times daily as needed. Mix 1 part acyclovir cream with 1 part over the counter hydrocortisone 1% cream and apply to affected area two times a day as needed 5 g 0  . albuterol (PROVENTIL HFA;VENTOLIN HFA) 108 (90 Base) MCG/ACT inhaler Inhale 2 puffs into the lungs 4 (four) times daily. 1 Inhaler 2  . benzonatate (TESSALON) 100 MG capsule Take by mouth.    . buprenorphine (SUBUTEX) 8 MG SUBL SL tablet Place 8 mg under the tongue 2 (two) times daily.     . cholestyramine (QUESTRAN) 4 g packet Take 1 packet (4 g total) by mouth 3 (three) times daily with meals. 60 each 12  . cyclobenzaprine (FLEXERIL) 10 MG tablet Take 1 tablet (10 mg total) by mouth 3 (three) times daily as needed for muscle spasms. 90 tablet 2  . folic acid (FOLVITE) 1 MG tablet Take 1 tablet (1 mg total) by mouth daily. 60 tablet 3  . gabapentin (NEURONTIN) 300 MG capsule TAKE 1 CAPSULE(300 MG) BY MOUTH THREE TIMES DAILY (Patient taking differently: Take 300 mg by mouth 3 (three) times daily. ) 90 capsule 0  . hydrocortisone  (ANUSOL-HC) 2.5 % rectal cream Place 1 application rectally 2 (two) times daily. 60 g 0  . ketoconazole (NIZORAL) 2 % cream Apply 1 application topically daily. 15 g 0  . metoCLOPramide (REGLAN) 10 MG tablet Take 1 tablet (10 mg total) by mouth every 8 (eight) hours as needed for nausea. 30 tablet 0  . omeprazole (PRILOSEC) 40 MG capsule Take 1 capsule (40 mg total) by mouth daily. 30 capsule 5  . ondansetron (ZOFRAN) 8 MG tablet Take 1 tablet (8 mg total) by mouth every 8 (eight) hours as needed for nausea  or vomiting. 20 tablet 0  . PARoxetine (PAXIL) 10 MG tablet Take 1 tablet (10 mg total) by mouth daily. 30 tablet 5  . Prenatal MV-Min-FA-Omega-3 (PRENATAL GUMMIES/DHA & FA) 0.4-32.5 MG CHEW Chew 2 each by mouth daily.    . promethazine (PHENERGAN) 25 MG suppository Place 1 suppository (25 mg total) rectally every 6 (six) hours as needed for nausea or vomiting. 12 each 0  . promethazine-codeine (PHENERGAN WITH CODEINE) 6.25-10 MG/5ML syrup Take 5 mLs by mouth 2 (two) times daily as needed for cough. 120 mL 0  . valACYclovir (VALTREX) 1000 MG tablet Take 1 tablet (1,000 mg total) by mouth 2 (two) times daily. 60 tablet 0  . doxycycline (VIBRA-TABS) 100 MG tablet Take 1 tablet (100 mg total) by mouth 2 (two) times daily. 20 tablet 0   No current facility-administered medications for this visit.   Facility-Administered Medications Ordered in Other Visits  Medication Dose Route Frequency Provider Last Rate Last Admin  . 0.9 % NaCl with KCl 20 mEq/ L  infusion   Intravenous Once Terri Estes, Terri Dad, MD      . sodium chloride flush (NS) 0.9 % injection 10 mL  10 mL Intracatheter PRN Terri Estes, Terri Dad, MD        OBJECTIVE: Young African-American woman   Vitals:   01/24/20 1317  BP: 115/87  Pulse: 96  Resp: 18  Temp: (!) 97.5 F (36.4 C)  SpO2: 96%   Wt Readings from Last 3 Encounters:  01/24/20 159 lb 4.8 oz (72.3 kg)  01/03/20 160 lb 14.4 oz (73 kg)  12/13/19 172 lb 11.2 oz (78.3 kg)     Body mass index is 26.51 kg/m.    ECOG FS:1 - Symptomatic but completely ambulatory  GENERAL: Patient is a well appearing female in no acute distress HEENT:  Sclerae anicteric.  Mask in place.  Neck is supple.  NODES:  No cervical, supraclavicular, or axillary lymphadenopathy palpated.  BREAST Wheezing in right lung, diminished in right lung base HEART:  Regular rate and rhythm. No murmur appreciated. ABDOMEN:  Soft, nontender.  Positive, normoactive bowel sounds. No organomegaly palpated. MSK:  No focal spinal tenderness to palpation. Full range of motion bilaterally in the upper extremities. EXTREMITIES:  No peripheral edema.   SKIN:  Clear with no obvious rashes or skin changes. No nail dyscrasia. NEURO:  Nonfocal. Well oriented.  Appropriate affect.     Right breast area 01/02/2019    Skin 11/09/2019    LAB RESULTS:  CMP     Component Value Date/Time   NA 142 01/24/2020 1255   NA 137 05/10/2018 1535   K 3.2 (L) 01/24/2020 1255   CL 103 01/24/2020 1255   CO2 30 01/24/2020 1255   GLUCOSE 82 01/24/2020 1255   BUN 8 01/24/2020 1255   BUN 7 05/10/2018 1535   CREATININE 0.84 01/24/2020 1255   CREATININE 0.81 07/03/2019 0919   CALCIUM 8.9 01/24/2020 1255   PROT 6.9 01/24/2020 1255   PROT 7.2 05/10/2018 1535   ALBUMIN 3.2 (L) 01/24/2020 1255   ALBUMIN 4.2 05/10/2018 1535   AST 12 (L) 01/24/2020 1255   AST 54 (H) 07/03/2019 0919   ALT 12 01/24/2020 1255   ALT 102 (H) 07/03/2019 0919   ALKPHOS 75 01/24/2020 1255   BILITOT 0.3 01/24/2020 1255   BILITOT <0.2 (L) 07/03/2019 0919   GFRNONAA >60 01/24/2020 1255   GFRNONAA >60 07/03/2019 0919   GFRAA >60 01/24/2020 1255   GFRAA >60 07/03/2019 0919  No results found for: TOTALPROTELP, ALBUMINELP, A1GS, A2GS, BETS, BETA2SER, GAMS, MSPIKE, SPEI  No results found for: KPAFRELGTCHN, LAMBDASER, KAPLAMBRATIO  Lab Results  Component Value Date   WBC 8.3 01/24/2020   NEUTROABS 6.3 01/24/2020   HGB 11.6 (L)  01/24/2020   HCT 36.6 01/24/2020   MCV 91.7 01/24/2020   PLT 271 01/24/2020    No results found for: LABCA2  No components found for: WIOXBD532  No results for input(s): INR in the last 168 hours.  No results found for: LABCA2  No results found for: DJM426  No results found for: STM196  No results found for: QIW979  No results found for: CA2729  No components found for: HGQUANT  No results found for: CEA1 / No results found for: CEA1   No results found for: AFPTUMOR  No results found for: [facility]  Lab Results  Component Value Date   HGBA 97.4 12/16/2016   (Hemoglobinopathy evaluation)   No results found for: LDH  No results found for: IRON, TIBC, IRONPCTSAT (Iron and TIBC)  Lab Results  Component Value Date   FERRITIN 900 (H) 09/12/2019    Urinalysis    Component Value Date/Time   COLORURINE YELLOW 11/29/2019 0138   APPEARANCEUR HAZY (A) 11/29/2019 0138   LABSPEC 1.020 11/29/2019 0138   PHURINE 6.0 11/29/2019 0138   GLUCOSEU NEGATIVE 11/29/2019 0138   HGBUR NEGATIVE 11/29/2019 0138   BILIRUBINUR NEGATIVE 11/29/2019 0138   KETONESUR 5 (A) 11/29/2019 0138   PROTEINUR NEGATIVE 11/29/2019 0138   UROBILINOGEN 4.0 (H) 01/13/2018 1417   NITRITE NEGATIVE 11/29/2019 0138   LEUKOCYTESUR MODERATE (A) 11/29/2019 0138     STUDIES:  No results found.   ELIGIBLE FOR AVAILABLE RESEARCH PROTOCOL: no   ASSESSMENT: 34 y.o. [place]. [place] woman status post right breast biopsy x2 and right axillary lymph node biopsy 04/19/2019 for a clinical T4 N2 MX, stage IIIC invasive ductal carcinoma, grade 3, triple negative, with an MIB-1 of 90%  (a) chest CT scan 04/11/2019 shows multiple subcentimeter lung nodules  (b) PET scan denied by insurance  (c) CT A/P and bone scan due 05/09/2019 show a very small low-density lesion in the lateral segment of the left liver which is indeterminate, and sclerosis of the right SI joint, which is felt to be likely degenerative; bone scan  was entirely negative.  (c) baseline breast MRI 04/30/2019 shows involvement of all 4 quadrants in the right breast as well as right axillary subpectoral and internal mammary lymphadenopathy.  Left breast was unremarkable  (1) neoadjuvant chemotherapy consisting of  (a) pembrolizumab started 05/03/2019, third dose delayed because of elevated liver function tests, resumed 08/02/2019  (b) paclitaxel weekly x12 with carboplatin every 21 days x 4, started 05/08/2019   (i) tolerated carboplatin AUC 5-day 1 poorly, switched to AUC 2 on day 1 day 8   (ii) cycle 2 delayed 1 week because of elevated liver function tests   (iii) carboplatin and paclitaxel discontinued after 07/03/2019 because of neuropathy, (received 7 weekly doses paclitaxel and one AUC5 carbo dose, four AUC2 doses)  (c) cyclophosphamide and doxorubicin x4 started 07/03/2019, completed 08/29/2019   (i) 2d dose given day 28 due to poor tolerance; other doses Q 21 days   (2) s/p right mastectomy and ALND 10/16/2019 showing a residual ypT2 ypN2 invasive ductal carcinoma, grade 3, with negative margins  (a) a total of 10 lymph nodes were removed, 4 with macrometastases, 1 with a micrometastasis  (3) adjuvant radiation to follow, consider capecitabine sensitization  (  4) genetics testing  (a) genetic testing 05/14/2019 through the Common Hereditary Gene Panel offered by [facility] found no deleterious mutations in APC, ATM, AXIN2, BARD1, BMPR1A, BRCA1, BRCA2, BRIP1, CDH1, CDK4, CDKN2A (p14ARF), CDKN2A (p16INK4a), CHEK2, CTNNA1, DICER1, EPCAM (Deletion/duplication testing only), GREM1 (promoter region deletion/duplication testing only), KIT, MEN1, MLH1, MSH2, MSH3, MSH6, MUTYH, NBN, NF1, NHTL1, PALB2, PDGFRA, PMS2, POLD1, POLE, PTEN, RAD50, RAD51C, RAD51D, RNF43, SDHB, SDHC, SDHD, SMAD4, SMARCA4. STK11, TP53, TSC1, TSC2, and VHL.  The following genes were evaluated for sequence changes only: SDHA and HOXB13 c.251G>A variant only  (5) goserelin  started 05/08/2019  (6) pembrolizumab started 08/02/2019, discontinued after fourth dose (10/02/2019) with psoriasis flare  (7) psoriasis:  (a) weekly methotrexate started 11/10/2019, held after 11/17/2019 dose so as not to overlap with radiation  (b) intravenous cyclophosphamide 1 g every 3 weeks starting 11/21/2019     PLAN: Terri Estes is tearful today.  She met with Dr. Jana Hakim and myself.  She notes she feels sick and is tired of coughing.  I am concerned she has an acute bacterial bronchitis that could be progressing into pneumonia.  She saw myself and Dr. Jana Hakim today.  She will receive IV Ceftriaxone today at 2g and will start Doxycicline BID.  I sent this into [facility] pharmacy and gave her information about it in her AVS.    Due to her cough and right neck swelling, I placed orders for CT neck and check to evaluate for recurrence.   My nurse was able to arrange that to where it would be easy for her to get to since she is coming here daily for radiation.  She will continue on Cyclophosphamide.  Her psoriasis is improving.  She will [facility] 02/25/2020 as scheduled.    Terri Estes will continue to return daily Monday through Friday for adjuvant radiation therapy.  She was recommended to continue with the appropriate pandemic precautions. She knows to call for any questions that may arise between now and her next appointment.  We are happy to see her sooner if needed.   Wilber Bihari, NP 01/28/20 8:49 AM Medical Oncology and Hematology [facility] [place], [place] [address] Tel. [phone]    Fax. [phone]   ADDENDUM: I am delighted that Terri Estes is psoriasis is finally settling down.  The lesions are now flat, discolored, and not itchy.  She will receive Cytoxan today.  Once she is done with radiation we can switch her back to methotrexate, which she was tolerating well and was effective as well.  I do not palpate any adenopathy in  her supraclavicular area but she feels a difference so we are going to obtain CTs of the neck and chest.  This is tentatively scheduled for 01/31/2020  She will return to see me on 02/14/2020.  She will still be under radiation at that time so that likely will be her final Cytoxan dose.  I will set her up for further follow-up and methotrexate at that time.  I personally saw this patient and performed a substantive portion of this encounter with the listed APP documented above.   Total encounter time 30 minutes.Chauncey Cruel, MD Medical Oncology and Hematology [facility] [address] [place], [place] [address] Tel. [phone]    Fax. [phone]   *Total Encounter Time as defined by the Centers for Medicare and Medicaid Services includes, in addition to the face-to-face time of a patient visit (documented in the note above)  non-face-to-face time: obtaining and reviewing outside history, ordering and reviewing medications, tests or procedures, care coordination (communications with other health care professionals or caregivers) and documentation in the medical record.

## 2020-01-24 NOTE — Patient Instructions (Signed)
[facility] Discharge Instructions for Patients Receiving Chemotherapy  Today you received the following chemotherapy agents: Cytoxan.  To help prevent nausea and vomiting after your treatment, we encourage you to take your nausea medication as directed.   If you develop nausea and vomiting that is not controlled by your nausea medication, call the clinic.   BELOW ARE SYMPTOMS THAT SHOULD BE REPORTED IMMEDIATELY:  *FEVER GREATER THAN 100.5 F  *CHILLS WITH OR WITHOUT FEVER  NAUSEA AND VOMITING THAT IS NOT CONTROLLED WITH YOUR NAUSEA MEDICATION  *UNUSUAL SHORTNESS OF BREATH  *UNUSUAL BRUISING OR BLEEDING  TENDERNESS IN MOUTH AND THROAT WITH OR WITHOUT PRESENCE OF ULCERS  *URINARY PROBLEMS  *BOWEL PROBLEMS  UNUSUAL RASH Items with * indicate a potential emergency and should be followed up as soon as possible.  Feel free to call the clinic should you have any questions or concerns. The clinic phone number is [phone].  Please show the [facility] at check-in to the Emergency Department and triage nurse.  Rib Contusion A rib contusion is a deep bruise on your rib area. Contusions are the result of a blunt trauma that causes bleeding and injury to the tissues under the skin. A rib contusion may involve bruising of the ribs and of the skin and muscles in the area. The skin over the contusion may turn blue, purple, or yellow. Minor injuries will give you a painless contusion. More severe contusions may stay painful and swollen for a few weeks. What are the causes? This condition is usually caused by a blow, trauma, or direct force to an area of the body. This often occurs while playing contact sports. What are the signs or symptoms? Symptoms of this condition include:  Swelling and redness of the injured area.  Discoloration of the injured area.  Tenderness and soreness of the injured area.  Pain with or without movement. How is this diagnosed? This  condition may be diagnosed based on:  Your symptoms and medical history.  A physical exam.  Imaging tests--such as an X-ray, CT scan, or MRI--to determine if there were internal injuries or broken bones (fractures). How is this treated? This condition may be treated with:  Rest. This is often the best treatment for a rib contusion.  Icing. This reduces swelling and inflammation.  Deep-breathing exercises. These may be recommended to reduce the risk for lung collapse and pneumonia.  Medicines. Over-the-counter or prescription medicines may be given to control pain.  Injection of a numbing medicine around the nerve near your injury (nerve block). Follow these instructions at home:     Medicines  Take over-the-counter and prescription medicines only as told by your health care provider.  Do not drive or use heavy machinery while taking prescription pain medicine.  If you are taking prescription pain medicine, take actions to prevent or treat constipation. Your health care provider may recommend that you: ? Drink enough fluid to keep your urine pale yellow. ? Eat foods that are high in fiber, such as fresh fruits and vegetables, whole grains, and beans. ? Limit foods that are high in fat and processed sugars, such as fried or sweet foods. ? Take an over-the-counter or prescription medicine for constipation. Managing pain, stiffness, and swelling  If directed, put ice on the injured area: ? Put ice in a plastic bag. ? Place a towel between your skin and the bag. ? Leave the ice on for 20 minutes, 2-3 times a day.  Rest the injured  area. Avoid strenuous activity and any activities or movements that cause pain. Be careful during activities and avoid bumping the injured area.  Do not lift anything that is heavier than 5 lb (2.3 kg), or the limit that you are told, until your health care provider says that it is safe. General instructions  Do not use any products that contain  nicotine or tobacco, such as cigarettes and e-cigarettes. These can delay healing. If you need help quitting, ask your health care provider.  Do deep-breathing exercises as told by your health care provider.  If you were given an incentive spirometer, use it every 1-2 hours while you are awake, or as recommended by your health care provider. This device measures how well you are filling your lungs with each breath.  Keep all follow-up visits as told by your health care provider. This is important. Contact a health care provider if you have:  Increased bruising or swelling.  Pain that is not controlled with treatment.  A fever. Get help right away if you:  Have difficulty breathing or shortness of breath.  Develop a continual cough or you cough up thick or bloody sputum.  Feel nauseous or you vomit.  Have pain in your abdomen. Summary  A rib contusion is a deep bruise on your rib area. Contusions are the result of a blunt trauma that causes bleeding and injury to the tissues under the skin.  The skin overlying the contusion may turn blue, purple, or yellow. Minor injuries may give you a painless contusion. More severe contusions may stay painful and swollen for a few weeks.  Rest the injured area. Avoid strenuous activity and any activities or movements that cause pain. This information is not intended to replace advice given to you by your health care provider. Make sure you discuss any questions you have with your health care provider. Document Revised: 01/11/2018 Document Reviewed: 01/11/2018 Elsevier Patient Education  2020 [employer].

## 2020-01-24 NOTE — Patient Instructions (Signed)
Doxycycline tablets or capsules What is this medicine? DOXYCYCLINE (dox i SYE kleen) is a tetracycline antibiotic. It kills certain bacteria or stops their growth. It is used to treat many kinds of infections, like dental, skin, respiratory, and urinary tract infections. It also treats acne, Lyme disease, malaria, and certain sexually transmitted infections. This medicine may be used for other purposes; ask your health care provider or pharmacist if you have questions. COMMON BRAND NAME(S): Acticlate, Adoxa, Adoxa CK, Adoxa Pak, Adoxa TT, Alodox, Avidoxy, Doxal, LYMEPAK, Mondoxyne NL, Monodox, Morgidox 1x, Morgidox 1x Kit, Morgidox 2x, Morgidox 2x Kit, NutriDox, Ocudox, Hendricks, Joyce, Vibra-Tabs, Vibramycin What should I tell my health care provider before I take this medicine? They need to know if you have any of these conditions:  liver disease  long exposure to sunlight like working outdoors  stomach problems like colitis  an unusual or allergic reaction to doxycycline, tetracycline antibiotics, other medicines, foods, dyes, or preservatives  pregnant or trying to get pregnant  breast-feeding How should I use this medicine? Take this medicine by mouth with a full glass of water. Follow the directions on the prescription label. It is best to take this medicine without food, but if it upsets your stomach take it with food. Take your medicine at regular intervals. Do not take your medicine more often than directed. Take all of your medicine as directed even if you think you are better. Do not skip doses or stop your medicine early. Talk to your pediatrician regarding the use of this medicine in children. While this drug may be prescribed for selected conditions, precautions do apply. Overdosage: If you think you have taken too much of this medicine contact a poison control center or emergency room at once. NOTE: This medicine is only for you. Do not share this medicine with others. What if  I miss a dose? If you miss a dose, take it as soon as you can. If it is almost time for your next dose, take only that dose. Do not take double or extra doses. What may interact with this medicine?  antacids  barbiturates  birth control pills  bismuth subsalicylate  carbamazepine  methoxyflurane  other antibiotics  phenytoin  vitamins that contain iron  warfarin This list may not describe all possible interactions. Give your health care provider a list of all the medicines, herbs, non-prescription drugs, or dietary supplements you use. Also tell them if you smoke, drink alcohol, or use illegal drugs. Some items may interact with your medicine. What should I watch for while using this medicine? Tell your doctor or health care professional if your symptoms do not improve. Do not treat diarrhea with over the counter products. Contact your doctor if you have diarrhea that lasts more than 2 days or if it is severe and watery. Do not take this medicine just before going to bed. It may not dissolve properly when you lay down and can cause pain in your throat. Drink plenty of fluids while taking this medicine to also help reduce irritation in your throat. This medicine can make you more sensitive to the sun. Keep out of the sun. If you cannot avoid being in the sun, wear protective clothing and use sunscreen. Do not use sun lamps or tanning beds/booths. Birth control pills may not work properly while you are taking this medicine. Talk to your doctor about using an extra method of birth control. If you are being treated for a sexually transmitted infection, avoid sexual contact until  you have finished your treatment. Your sexual partner may also need treatment. Avoid antacids, aluminum, calcium, magnesium, and iron products for 4 hours before and 2 hours after taking a dose of this medicine. If you are using this medicine to prevent malaria, you should still protect yourself from contact with  mosquitos. Stay in screened-in areas, use mosquito nets, keep your body covered, and use an insect repellent. What side effects may I notice from receiving this medicine? Side effects that you should report to your doctor or health care professional as soon as possible:  allergic reactions like skin rash, itching or hives, swelling of the face, lips, or tongue  difficulty breathing  fever  itching in the rectal or genital area  pain on swallowing  rash, fever, and swollen lymph nodes  redness, blistering, peeling or loosening of the skin, including inside the mouth  severe stomach pain or cramps  unusual bleeding or bruising  unusually weak or tired  yellowing of the eyes or skin Side effects that usually do not require medical attention (report to your doctor or health care professional if they continue or are bothersome):  diarrhea  loss of appetite  nausea, vomiting This list may not describe all possible side effects. Call your doctor for medical advice about side effects. You may report side effects to FDA at 1-800-FDA-1088. Where should I keep my medicine? Keep out of the reach of children. Store at room temperature, below 30 degrees C (86 degrees F). Protect from light. Keep container tightly closed. Throw away any unused medicine after the expiration date. Taking this medicine after the expiration date can make you seriously ill. NOTE: This sheet is a summary. It may not cover all possible information. If you have questions about this medicine, talk to your doctor, pharmacist, or health care provider.  2020 Elsevier/Gold Standard (2019-03-15 13:44:53)  

## 2020-01-25 ENCOUNTER — Ambulatory Visit: Payer: Medicaid Other

## 2020-01-28 ENCOUNTER — Ambulatory Visit
Admission: RE | Admit: 2020-01-28 | Discharge: 2020-01-28 | Disposition: A | Discharge status: Home / Self Care | Payer: Medicaid Other | Source: Ambulatory Visit | Attending: Radiation Oncology | Admitting: Radiation Oncology

## 2020-01-28 ENCOUNTER — Other Ambulatory Visit: Payer: Self-pay

## 2020-01-28 DIAGNOSIS — Z171 Estrogen receptor negative status [ER-]: Secondary | ICD-10-CM | POA: Insufficient documentation

## 2020-01-28 DIAGNOSIS — C50811 Malignant neoplasm of overlapping sites of right female breast: Secondary | ICD-10-CM | POA: Insufficient documentation

## 2020-01-29 ENCOUNTER — Ambulatory Visit
Admission: RE | Admit: 2020-01-29 | Discharge: 2020-01-29 | Disposition: A | Discharge status: Home / Self Care | Payer: Medicaid Other | Source: Ambulatory Visit | Attending: Radiation Oncology | Admitting: Radiation Oncology

## 2020-01-29 ENCOUNTER — Other Ambulatory Visit: Payer: Self-pay

## 2020-01-29 DIAGNOSIS — C50811 Malignant neoplasm of overlapping sites of right female breast: Secondary | ICD-10-CM | POA: Diagnosis not present

## 2020-01-29 MED ORDER — ALRA NON-METALLIC DEODORANT (RAD-ONC)
1.0000 "application " | Freq: Once | TOPICAL | Status: AC
  Administered 2020-01-29: 1 via TOPICAL

## 2020-01-29 MED ORDER — RADIAPLEXRX EX GEL: Freq: Once | CUTANEOUS | Status: AC

## 2020-01-29 NOTE — Progress Notes (Signed)
Pt here for patient teaching.  Pt given Radiation and You booklet, skin care instructions, Alra deodorant and Radiaplex gel.  Reviewed areas of pertinence such as fatigue, hair loss, skin changes, breast tenderness and breast swelling . Pt able to give teach back of to pat skin and use unscented/gentle soap,apply Radiaplex bid, avoid applying anything to skin within 4 hours of treatment, avoid wearing an under wire bra and to use an electric razor if they must shave. Pt verbalizes understanding of information given and will contact nursing with any questions or concerns.     Tonisha Silvey M. Riaan Toledo RN, BSN      

## 2020-01-30 ENCOUNTER — Ambulatory Visit
Admission: RE | Admit: 2020-01-30 | Discharge: 2020-01-30 | Disposition: A | Discharge status: Home / Self Care | Payer: Medicaid Other | Source: Ambulatory Visit | Attending: Radiation Oncology | Admitting: Radiation Oncology

## 2020-01-30 ENCOUNTER — Other Ambulatory Visit: Payer: Self-pay | Admitting: Radiation Oncology

## 2020-01-30 DIAGNOSIS — C50811 Malignant neoplasm of overlapping sites of right female breast: Secondary | ICD-10-CM | POA: Diagnosis not present

## 2020-01-30 MED ORDER — PROMETHAZINE-CODEINE 6.25-10 MG/5ML PO SYRP: 5.0000 mL | ORAL_SOLUTION | Freq: Two times a day (BID) | ORAL | 0 refills | Status: DC | PRN

## 2020-01-30 MED FILL — PROMETHAZINE W/COD SYRUP: 6.25-10 | 18 days supply | Qty: 180 | Fill #0

## 2020-01-31 ENCOUNTER — Inpatient Hospital Stay: Payer: Medicaid Other

## 2020-01-31 ENCOUNTER — Ambulatory Visit: Admission: RE | Admit: 2020-01-31 | Payer: Medicaid Other | Source: Ambulatory Visit

## 2020-01-31 ENCOUNTER — Ambulatory Visit
Admission: RE | Admit: 2020-01-31 | Discharge: 2020-01-31 | Disposition: A | Discharge status: Home / Self Care | Payer: Medicaid Other | Source: Ambulatory Visit | Attending: Radiation Oncology | Admitting: Radiation Oncology

## 2020-01-31 ENCOUNTER — Other Ambulatory Visit: Payer: Self-pay

## 2020-01-31 ENCOUNTER — Ambulatory Visit (HOSPITAL_COMMUNITY)
Admission: RE | Admit: 2020-01-31 | Discharge: 2020-01-31 | Disposition: A | Discharge status: Home / Self Care | Payer: Medicaid Other | Source: Ambulatory Visit | Attending: Adult Health | Admitting: Adult Health

## 2020-01-31 DIAGNOSIS — Z171 Estrogen receptor negative status [ER-]: Secondary | ICD-10-CM | POA: Insufficient documentation

## 2020-01-31 DIAGNOSIS — C50811 Malignant neoplasm of overlapping sites of right female breast: Secondary | ICD-10-CM | POA: Insufficient documentation

## 2020-01-31 DIAGNOSIS — J189 Pneumonia, unspecified organism: Secondary | ICD-10-CM | POA: Diagnosis present

## 2020-01-31 MED ORDER — SODIUM CHLORIDE (PF) 0.9 % IJ SOLN
INTRAMUSCULAR | Status: AC
  Filled 2020-01-31: qty 50

## 2020-01-31 MED ORDER — IOHEXOL 300 MG/ML  SOLN
75.0000 mL | Freq: Once | INTRAMUSCULAR | Status: AC | PRN
  Administered 2020-01-31: 75 mL via INTRAVENOUS

## 2020-01-31 NOTE — Patient Instructions (Signed)
Goserelin injection What is this medicine? GOSERELIN (GOE se rel in) is similar to a hormone found in the body. It lowers the amount of sex hormones that the body makes. Men will have lower testosterone levels and women will have lower estrogen levels while taking this medicine. In men, this medicine is used to treat prostate cancer; the injection is either given once per month or once every 12 weeks. A once per month injection (only) is used to treat women with endometriosis, dysfunctional uterine bleeding, or advanced breast cancer. This medicine may be used for other purposes; ask your health care provider or pharmacist if you have questions. COMMON BRAND NAME(S): Zoladex What should I tell my health care provider before I take this medicine? They need to know if you have any of these conditions:  bone problems  diabetes  heart disease  history of irregular heartbeat  an unusual or allergic reaction to goserelin, other medicines, foods, dyes, or preservatives  pregnant or trying to get pregnant  breast-feeding How should I use this medicine? This medicine is for injection under the skin. It is given by a health care professional in a hospital or clinic setting. Talk to your pediatrician regarding the use of this medicine in children. Special care may be needed. Overdosage: If you think you have taken too much of this medicine contact a poison control center or emergency room at once. NOTE: This medicine is only for you. Do not share this medicine with others. What if I miss a dose? It is important not to miss your dose. Call your doctor or health care professional if you are unable to keep an appointment. What may interact with this medicine? Do not take this medicine with any of the following medications:  cisapride  dronedarone  pimozide  thioridazine This medicine may also interact with the following medications:  other medicines that prolong the QT interval (an abnormal  heart rhythm) This list may not describe all possible interactions. Give your health care provider a list of all the medicines, herbs, non-prescription drugs, or dietary supplements you use. Also tell them if you smoke, drink alcohol, or use illegal drugs. Some items may interact with your medicine. What should I watch for while using this medicine? Visit your doctor or health care provider for regular checks on your progress. Your symptoms may appear to get worse during the first weeks of this therapy. Tell your doctor or healthcare provider if your symptoms do not start to get better or if they get worse after this time. Your bones may get weaker if you take this medicine for a long time. If you smoke or frequently drink alcohol you may increase your risk of bone loss. A family history of osteoporosis, chronic use of drugs for seizures (convulsions), or corticosteroids can also increase your risk of bone loss. Talk to your doctor about how to keep your bones strong. This medicine should stop regular monthly menstruation in women. Tell your doctor if you continue to menstruate. Women should not become pregnant while taking this medicine or for 12 weeks after stopping this medicine. Women should inform their doctor if they wish to become pregnant or think they might be pregnant. There is a potential for serious side effects to an unborn child. Talk to your health care professional or pharmacist for more information. Do not breast-feed an infant while taking this medicine. Men should inform their doctors if they wish to father a child. This medicine may lower sperm counts. Talk   to your health care professional or pharmacist for more information. This medicine may increase blood sugar. Ask your healthcare provider if changes in diet or medicines are needed if you have diabetes. What side effects may I notice from receiving this medicine? Side effects that you should report to your doctor or health care  professional as soon as possible:  allergic reactions like skin rash, itching or hives, swelling of the face, lips, or tongue  bone pain  breathing problems  changes in vision  chest pain  feeling faint or lightheaded, falls  fever, chills  pain, swelling, warmth in the leg  pain, tingling, numbness in the hands or feet  signs and symptoms of high blood sugar such as being more thirsty or hungry or having to urinate more than normal. You may also feel very tired or have blurry vision  signs and symptoms of low blood pressure like dizziness; feeling faint or lightheaded, falls; unusually weak or tired  stomach pain  swelling of the ankles, feet, hands  trouble passing urine or change in the amount of urine  unusually high or low blood pressure  unusually weak or tired Side effects that usually do not require medical attention (report to your doctor or health care professional if they continue or are bothersome):  change in sex drive or performance  changes in breast size in both males and females  changes in emotions or moods  headache  hot flashes  irritation at site where injected  loss of appetite  skin problems like acne, dry skin  vaginal dryness This list may not describe all possible side effects. Call your doctor for medical advice about side effects. You may report side effects to FDA at 1-800-FDA-1088. Where should I keep my medicine? This drug is given in a hospital or clinic and will not be stored at home. NOTE: This sheet is a summary. It may not cover all possible information. If you have questions about this medicine, talk to your doctor, pharmacist, or health care provider.  2020 Elsevier/Gold Standard (2019-04-02 14:05:56) Coronavirus (COVID-19) Are you at risk?  Are you at risk for the Coronavirus (COVID-19)?  To be considered HIGH RISK for Coronavirus (COVID-19), you have to meet the following criteria:  . Traveled to [place], [place],  [place], [place] or [place]; or in the [place] to [place], [place], [place], or [place]; and have fever, cough, and shortness of breath within the last 2 weeks of travel OR . Been in close contact with a person diagnosed with COVID-19 within the last 2 weeks and have fever, cough, and shortness of breath . IF YOU DO NOT MEET THESE CRITERIA, YOU ARE CONSIDERED LOW RISK FOR COVID-19.  What to do if you are HIGH RISK for COVID-19?  Marland Kitchen If you are having a medical emergency, call 911. . Seek medical care right away. Before you go to a doctor's office, urgent care or emergency department, call ahead and tell them about your recent travel, contact with someone diagnosed with COVID-19, and your symptoms. You should receive instructions from your physician's office regarding next steps of care.  . When you arrive at healthcare provider, tell the healthcare staff immediately you have returned from visiting [place], [place], [place], [place] or [place]; or traveled in the [place] to [place], [place], [place], or [place]; in the last two weeks or you have been in close contact with a person diagnosed with COVID-19 in the last 2 weeks.   Marland Kitchen  Tell the health care staff about your symptoms: fever, cough and shortness of breath. . After you have been seen by a medical provider, you will be either: o Tested for (COVID-19) and discharged home on quarantine except to seek medical care if symptoms worsen, and asked to  - Stay home and avoid contact with others until you get your results (4-5 days)  - Avoid travel on public transportation if possible (such as bus, train, or airplane) or o Sent to the Emergency Department by EMS for evaluation, COVID-19 testing, and possible admission depending on your condition and test results.  What to do if you are LOW RISK for COVID-19?  Reduce your risk of any infection by using the same precautions used for avoiding the common cold or flu:  Marland Kitchen Wash your  hands often with soap and warm water for at least 20 seconds.  If soap and water are not readily available, use an alcohol-based hand sanitizer with at least 60% alcohol.  . If coughing or sneezing, cover your mouth and nose by coughing or sneezing into the elbow areas of your shirt or coat, into a tissue or into your sleeve (not your hands). . Avoid shaking hands with others and consider head nods or verbal greetings only. . Avoid touching your eyes, nose, or mouth with unwashed hands.  . Avoid close contact with people who are sick. . Avoid places or events with large numbers of people in one location, like concerts or sporting events. . Carefully consider travel plans you have or are making. . If you are planning any travel outside or inside the [place], visit the CDC's Travelers' Health webpage for the latest health notices. . If you have some symptoms but not all symptoms, continue to monitor at home and seek medical attention if your symptoms worsen. . If you are having a medical emergency, call 911.   [facility] / e-Visit: [email or web address]         [facility]: [facility]: [id number]                   [facility]: [phone]

## 2020-02-01 ENCOUNTER — Ambulatory Visit
Admission: RE | Admit: 2020-02-01 | Discharge: 2020-02-01 | Disposition: A | Discharge status: Home / Self Care | Payer: Medicaid Other | Source: Ambulatory Visit | Attending: Radiation Oncology | Admitting: Radiation Oncology

## 2020-02-01 ENCOUNTER — Other Ambulatory Visit: Payer: Self-pay

## 2020-02-01 ENCOUNTER — Telehealth: Payer: Self-pay

## 2020-02-01 ENCOUNTER — Ambulatory Visit: Payer: Medicaid Other

## 2020-02-01 DIAGNOSIS — C50811 Malignant neoplasm of overlapping sites of right female breast: Secondary | ICD-10-CM | POA: Diagnosis not present

## 2020-02-01 NOTE — Telephone Encounter (Signed)
Left voicemail for patient to return call.

## 2020-02-01 NOTE — Telephone Encounter (Signed)
Left voicemail regarding follow up appointment with Dr. Jana Hakim for 2/8.

## 2020-02-01 NOTE — Progress Notes (Deleted)
Patient here today for Zoladex injection and radiation. Patient went to radiation but did not arrive to injection appt. Called Radiation and they stated she left upset. Called Patient with no response.

## 2020-02-03 NOTE — Progress Notes (Signed)
[facility]  [phone]) [phone] [phone]) [phone]    ID: Terri Estes DOB: May 05, 1989  MR#: [id number]  RSW#:[id number]  Patient Care Team: Patient, No Pcp Per as PCP - General (General Practice) Alphonsa Overall, MD as Consulting Physician (General Surgery) Marilouise Densmore, [name], MD as Consulting Physician (Oncology) Kyung Rudd, MD as Consulting Physician (Radiation Oncology) Rockwell Germany, RN as Oncology Nurse Navigator Mauro Kaufmann, RN as Oncology Nurse Navigator Donnamae Jude, MD as Consulting Physician (Obstetrics and Gynecology) Pavelock, Ralene Bathe, MD as Consulting Physician (Internal Medicine) OTHER MD: Joylene Igo for [facility] is [phone]   CHIEF COMPLAINT: 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  She had been on methotrexate initially, then switched to cyclophosphamide so as not to compromise her radiation.  Her last cyclophosphamide dose was 01/24/2020.  This is worked well for her and the psoriasis is resolving.  She is currently receiving radiation therapy treatments under Dr. Lisbeth Renshaw. She started on 01/23/2020 and is scheduled to finish on 03/19/2020.  Since her last visit, she underwent neck and chest CT on 01/31/2020. Neck CT showed: no acute abnormality of the neck; findings suggesting left vocal cord paresis.  Chest CT unfortunately showed findings consistent with new, widespread metastatic disease-- numerous new small bilateral pulmonary nodules, measuring up to 4 mm in superior left lower lobe; new moderate right pleural effusion with diffuse interlobular septal thickening, presumably malignant and reflecting lymphangitic spread of disease; new indistinct soft tissue or matted lymph nodes about the right hilum and  throughout the mediastinum, with newly enlarged para-aortic lymph nodes in the lower chest; numerous new low-attenuation lesions of the liver, measuring up to 1.4 cm in the liver dome; new, high-grade anterior wedge deformity of T1 with greater than 50% anterior height loss.  She has an appointment with dermatology at [facility] on 02/25/2020.    REVIEW OF SYSTEMS: [name] is understandably "scared".  She says "I do not want to die".  Despite the T1 problem, she does not have pain or focal findings.  She has a little bit of blurring of her vision on the right.  She has a constant cough.  She is receiving some antitussives so that she can undergo radiation.  She is very short of breath and at home has not been able to do very much except sit around and lie down.  Her mother who also lives in [place] has been coming in to help.  The psoriasis is improved as noted above.  She is not itchy anymore.   HISTORY OF CURRENT ILLNESS: From the original intake note:  [name] presented with swelling along the left side of the face with neck pain. She then underwent a neck CT on 04/11/2019 showing: Enlarged left level 2 lymph nodes with associated inflammatory change including stranding of the adjacent fat and thickening of the platysma. While malignancy is not excluded, this is most concerning for infection. No focal abscess is present. Hypoattenuation within 1 of the left level 2 lymph nodes likely represents central necrosis or suppuration of the node. No primary malignancy or other abscess. Left upper lobe pulmonary nodules may be inflammatory. The largest measures 0.7 x 0.7 x 0.6 cm. CT of the chest with contrast may be useful for further evaluation.  She also underwent a chest CT on the  same day showing: Complex right breast mass measuring 5.6 x 4.5 x 4.7 cm consistent with a primary breast carcinoma. Malignant right axillary adenopathy measuring up to 3.1 cm. Multiple bilateral pulmonary nodules are  concerning for metastatic disease. Given the right breast mass, the left cervical lymph nodes are more likely malignant.  She then underwent bilateral diagnostic mammography with tomography and right breast ultrasonography at [facility] on 04/19/2019 showing: Breast Density Category C; findings which are highly suspicious for multicentric inflammatory right breast cancer with right axillary nodal metastatic disease; no mammographic evidence of malignancy involving the left breast.  Accordingly on 04/19/2019 she proceeded to biopsy of the right breast area in question. The pathology from this procedure showed ([id number]): invasive ductal carcinoma, grade III, upper inner quadrant, 12:30 o'clock, 5.0 cm from the nipple. Prognostic indicators significant for: estrogen receptor, 0% negative and progesterone receptor, 0% negative. Proliferation marker Ki67 at 90%. HER2 negative (0+).  Additional biopsies of the right breast and right axilla were performed on the same day. The pathology from this procedure showed ([id number]): 2. Breast, right, needle core biopsy, satellite mass UOQ, 10 o'clock, 8cmfn  - Invasive ductal carcinoma, grade III 3. Lymph node, needle/core biopsy, level 1 right axilla   - Invasive ductal carcinoma, grade III  The patient's subsequent history is as detailed below.   PAST MEDICAL HISTORY: Past Medical History:  Diagnosis Date  . Anemia    after pregnancy  . Anxiety    severe not taking meds  . Asthma    inhaler  4x per day  . Cancer ([place])   . Chronic pain   . Depression   . Family history of breast cancer   . GERD (gastroesophageal reflux disease)   . History of substance abuse ([place])   . Pregnancy induced hypertension    takes medication now.  . Psoriasis    all over body  . Trichomonas infection   . Vaginal Pap smear, abnormal     PAST SURGICAL HISTORY: Past Surgical History:  Procedure Laterality Date  . CESAREAN SECTION  05/26/2008  . CESAREAN  SECTION N/A 07/10/2017   Procedure: CESAREAN SECTION;  Surgeon: Florian Buff, MD;  Location: [facility];  Service: Obstetrics;  Laterality: N/A;  . CESAREAN SECTION N/A 09/23/2018   Procedure: CESAREAN SECTION;  Surgeon: Donnamae Jude, MD;  Location: [facility];  Service: Obstetrics;  Laterality: N/A;  . COLPOSCOPY    . MASTECTOMY WITH AXILLARY LYMPH NODE DISSECTION Right 10/16/2019   Procedure: RIGHT MASTECTOMY WITH AXILLARY LYMPH NODE DISSECTION;  Surgeon: Alphonsa Overall, MD;  Location: [facility];  Service: General;  Laterality: Right;  . PORTACATH PLACEMENT Left 04/30/2019   Procedure: INSERTION PORT-A-CATH WITH ULTRASOUND;  Surgeon: Alphonsa Overall, MD;  Location: [facility];  Service: General;  Laterality: Left;  Left Subclavian vein  . WISDOM TOOTH EXTRACTION      FAMILY HISTORY: Family History  Problem Relation Age of Onset  . Kidney disease Maternal Grandmother   . Breast cancer Maternal Grandmother 28       metastatic to liver; d. 37  . Kidney disease Maternal Grandfather   . Kidney disease Paternal Grandmother   . Diabetes Paternal Grandmother   . Breast cancer Paternal Grandmother   . Kidney disease Paternal Grandfather   . Breast cancer Paternal Aunt   . Cerebral palsy Child   . Breast cancer Paternal Great-grandmother   . Pancreatic cancer Other        PGMs brother   .  Throat cancer Paternal Uncle   . Breast cancer Other        PGMs sister  (Updated 04/25/2019) Demaris's father is living at age 56. Patients' mother is also living at age 2. Marland Kitchen[place]ania notes, however, that she was raised by her grandmother.) The patient has 0 brothers and 5 sisters. Patient denies anyone in her family having ovarian, prostate, or pancreatic cancer. Her maternal grandmother was diagnosed with breast cancer, unsure what age. On her father's side, she reports her grandmother had cysts in her breasts, her great-aunt might have had breast cancer, and her great-grandmother was diagnosed with  breast cancer at an older age.   GYNECOLOGIC HISTORY:  No LMP recorded. (Menstrual status: Chemotherapy).  She had an emergent C-section in 09/23/2018 for abruptio placenta at 30 weeks pregnancy, also has a history of eclampsia Menarche:    years old Age at first live birth: 34 years old [place] P: 3 LMP: 04/18/2019 Contraceptive: On goserelin Hysterectomy?: no BSO?: no   SOCIAL HISTORY:  [name] is currently unemployed. She previously worked at [employer]. She is engaged. Fiance [name] runs a Midwife and works other odd jobs.  The patient recently moved to [place].  Daughter Demetrius Charity, age 5, was abused by her father at 57 months and has cerebral palsy. Son [name], age 20, has severe asthma and is developmentally disabled. Daughter Yetta Glassman was born on 09/23/2018 prematurely and remains on oxygen at home.  The patient's mother lives in [place] and frequently comes by to assist.  ADVANCED DIRECTIVES: not in place.    HEALTH MAINTENANCE: Social History   Tobacco Use  . Smoking status: Current Every Day Smoker    Packs/day: 0.10    Years: 0.00    Pack years: 0.00    Types: Cigarettes  . Smokeless tobacco: Never Used  . Tobacco comment: trying to quit - smokes 2 cigarettes/day  Substance Use Topics  . Alcohol use: No  . Drug use: Yes    Frequency: 7.0 times per week    Types: Marijuana    Comment: subutex last dose 10/15/19, last dose Sun 10/14/19    Colonoscopy: n/a  PAP: 05/10/2018  Bone density: never done Mammography: first performed following abnormal CT  No Known Allergies   Current Outpatient Medications  Medication Sig Dispense Refill  . acyclovir ointment (ZOVIRAX) 5 % Apply 1 application topically 2 (two) times daily as needed. Mix 1 part acyclovir cream with 1 part over the counter hydrocortisone 1% cream and apply to affected area two times a day as needed 5 g 0  . albuterol (PROVENTIL HFA;VENTOLIN HFA) 108 (90 Base) MCG/ACT inhaler Inhale 2 puffs  into the lungs 4 (four) times daily. 1 Inhaler 2  . benzonatate (TESSALON) 100 MG capsule Take by mouth.    . buprenorphine (SUBUTEX) 8 MG SUBL SL tablet Place 8 mg under the tongue 2 (two) times daily.     . cholestyramine (QUESTRAN) 4 g packet Take 1 packet (4 g total) by mouth 3 (three) times daily with meals. 60 each 12  . cyclobenzaprine (FLEXERIL) 10 MG tablet Take 1 tablet (10 mg total) by mouth 3 (three) times daily as needed for muscle spasms. 90 tablet 2  . doxycycline (VIBRA-TABS) 100 MG tablet Take 1 tablet (100 mg total) by mouth 2 (two) times daily. 20 tablet 0  . folic acid (FOLVITE) 1 MG tablet Take 1 tablet (1 mg total) by mouth daily. 60 tablet 3  . gabapentin (NEURONTIN) 300 MG capsule TAKE  1 CAPSULE(300 MG) BY MOUTH THREE TIMES DAILY (Patient taking differently: Take 300 mg by mouth 3 (three) times daily. ) 90 capsule 0  . hydrocortisone (ANUSOL-HC) 2.5 % rectal cream Place 1 application rectally 2 (two) times daily. 60 g 0  . ketoconazole (NIZORAL) 2 % cream Apply 1 application topically daily. 15 g 0  . metoCLOPramide (REGLAN) 10 MG tablet Take 1 tablet (10 mg total) by mouth every 8 (eight) hours as needed for nausea. 30 tablet 0  . omeprazole (PRILOSEC) 40 MG capsule Take 1 capsule (40 mg total) by mouth daily. 30 capsule 5  . ondansetron (ZOFRAN) 8 MG tablet Take 1 tablet (8 mg total) by mouth every 8 (eight) hours as needed for nausea or vomiting. 20 tablet 0  . PARoxetine (PAXIL) 10 MG tablet Take 1 tablet (10 mg total) by mouth daily. 30 tablet 5  . Prenatal MV-Min-FA-Omega-3 (PRENATAL GUMMIES/DHA & FA) 0.4-32.5 MG CHEW Chew 2 each by mouth daily.    . promethazine (PHENERGAN) 25 MG suppository Place 1 suppository (25 mg total) rectally every 6 (six) hours as needed for nausea or vomiting. 12 each 0  . promethazine-codeine (PHENERGAN WITH CODEINE) 6.25-10 MG/5ML syrup Take 5 mLs by mouth 2 (two) times daily as needed for cough. 180 mL 0  . valACYclovir (VALTREX) 1000 MG  tablet Take 1 tablet (1,000 mg total) by mouth 2 (two) times daily. 60 tablet 0   No current facility-administered medications for this visit.   Facility-Administered Medications Ordered in Other Visits  Medication Dose Route Frequency Provider Last Rate Last Admin  . 0.9 % NaCl with KCl 20 mEq/ L  infusion   Intravenous Once Aloysius Heinle, [name], MD      . sodium chloride flush (NS) 0.9 % injection 10 mL  10 mL Intracatheter PRN Keywon Mestre, [name], MD        OBJECTIVE: Young African-American woman who appears stated age  Vitals:   02/04/20 1157  BP: 102/86  Pulse: (!) 128  Resp: 20  Temp: 98.7 F (37.1 C)  SpO2: 92%   Wt Readings from Last 3 Encounters:  02/04/20 157 lb 12.8 oz (71.6 kg)  01/24/20 159 lb 4.8 oz (72.3 kg)  01/03/20 160 lb 14.4 oz (73 kg)   Body mass index is 26.26 kg/m.    ECOG FS:1 - Symptomatic but completely ambulatory  Sclerae unicteric, EOMs intact Wearing a mask Left supraclavicular adenopathy noted as before Lungs no rales or rhonchi, dullness on right side to percussion Heart regular rate and rhythm Abd soft, nontender, positive bowel sounds MSK no focal spinal tenderness Neuro: nonfocal, well oriented, appropriate affect Breasts: The right breast is status post mastectomy.  The area and under the radiation port is beginning to become hyperpigmented but there is no desquamation. Skin: The psoriasis is greatly improved.  Right breast area 01/02/2019    Skin 11/09/2019    LAB RESULTS:  CMP     Component Value Date/Time   NA 142 01/24/2020 1255   NA 137 05/10/2018 1535   K 3.2 (L) 01/24/2020 1255   CL 103 01/24/2020 1255   CO2 30 01/24/2020 1255   GLUCOSE 82 01/24/2020 1255   BUN 8 01/24/2020 1255   BUN 7 05/10/2018 1535   CREATININE 0.84 01/24/2020 1255   CREATININE 0.81 07/03/2019 0919   CALCIUM 8.9 01/24/2020 1255   PROT 6.9 01/24/2020 1255   PROT 7.2 05/10/2018 1535   ALBUMIN 3.2 (L) 01/24/2020 1255   ALBUMIN 4.2 05/10/2018  1535   AST 12 (L) 01/24/2020 1255   AST 54 (H) 07/03/2019 0919   ALT 12 01/24/2020 1255   ALT 102 (H) 07/03/2019 0919   ALKPHOS 75 01/24/2020 1255   BILITOT 0.3 01/24/2020 1255   BILITOT <0.2 (L) 07/03/2019 0919   GFRNONAA >60 01/24/2020 1255   GFRNONAA >60 07/03/2019 0919   GFRAA >60 01/24/2020 1255   GFRAA >60 07/03/2019 0919    No results found for: TOTALPROTELP, ALBUMINELP, A1GS, A2GS, BETS, BETA2SER, GAMS, MSPIKE, SPEI  No results found for: KPAFRELGTCHN, LAMBDASER, KAPLAMBRATIO  Lab Results  Component Value Date   WBC 8.3 01/24/2020   NEUTROABS 6.3 01/24/2020   HGB 11.6 (L) 01/24/2020   HCT 36.6 01/24/2020   MCV 91.7 01/24/2020   PLT 271 01/24/2020    No results found for: LABCA2  No components found for: [id number]  No results for input(s): INR in the last 168 hours.  No results found for: LABCA2  No results found for: KWI097  No results found for: DZH299  No results found for: MEQ683  No results found for: CA2729  No components found for: HGQUANT  No results found for: CEA1 / No results found for: CEA1   No results found for: AFPTUMOR  No results found for: [facility]  Lab Results  Component Value Date   HGBA 97.4 12/16/2016   (Hemoglobinopathy evaluation)   No results found for: LDH  No results found for: IRON, TIBC, IRONPCTSAT (Iron and TIBC)  Lab Results  Component Value Date   FERRITIN 900 (H) 09/12/2019    Urinalysis    Component Value Date/Time   COLORURINE YELLOW 11/29/2019 0138   APPEARANCEUR HAZY (A) 11/29/2019 0138   LABSPEC 1.020 11/29/2019 0138   PHURINE 6.0 11/29/2019 0138   GLUCOSEU NEGATIVE 11/29/2019 0138   HGBUR NEGATIVE 11/29/2019 0138   BILIRUBINUR NEGATIVE 11/29/2019 0138   KETONESUR 5 (A) 11/29/2019 0138   PROTEINUR NEGATIVE 11/29/2019 0138   UROBILINOGEN 4.0 (H) 01/13/2018 1417   NITRITE NEGATIVE 11/29/2019 0138   LEUKOCYTESUR MODERATE (A) 11/29/2019 0138     STUDIES:  CT Soft Tissue Neck W  Contrast  Result Date: 01/31/2020 CLINICAL DATA:  Cough for 2 months. EXAM: CT NECK WITH CONTRAST TECHNIQUE: Multidetector CT imaging of the neck was performed using the standard protocol following the bolus administration of intravenous contrast. CONTRAST:  67m OMNIPAQUE IOHEXOL 300 MG/ML  SOLN COMPARISON:  04/11/2019 CT neck FINDINGS: PHARYNX AND LARYNX: --Nasopharynx: Fossae of Rosenmuller are clear. Normal adenoid tonsils for age. --Oral cavity and oropharynx: The palatine and lingual tonsils are normal. The visible oral cavity and floor of mouth are normal. --Hypopharynx: Normal vallecula and pyriform sinuses. --Larynx: Medial deviation of the left vocal fold with dilated left laryngeal ventricle and piriform sinus. --Retropharyngeal space: No abscess, effusion or lymphadenopathy. SALIVARY GLANDS: --Parotid: No mass lesion or inflammation. No sialolithiasis or ductal dilatation. --Submandibular: Symmetric without inflammation. No sialolithiasis or ductal dilatation. --Sublingual: Normal. No ranula or other visible lesion of the base of tongue and floor of mouth. THYROID: Normal. LYMPH NODES: No enlarged or abnormal density lymph nodes. VASCULAR: Major cervical vessels are patent. LIMITED INTRACRANIAL: Normal. VISUALIZED ORBITS: Normal. MASTOIDS AND VISUALIZED PARANASAL SINUSES: No fluid levels or advanced mucosal thickening. No mastoid effusion. SKELETON: T1 compression deformity is new since 04/11/2019. UPPER CHEST: Large right pleural effusion. 4 mm nodule the right upper lobe (series 2, image 100). OTHER: None. IMPRESSION: 1. No acute abnormality of the neck. 2. Large right pleural effusion. This and right  upper lobe pulmonary nodules are more completely characterized on the concomitant CT of the chest. Please see dedicated report for that study. 3. 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 4. T1 compression deformity, new since 04/11/2019. Electronically Signed   By: Ulyses Jarred M.D.    On: 01/31/2020 20:20   CT Chest W Contrast  Result Date: 01/31/2020 CLINICAL DATA:  Breast cancer staging, cough, status post right mastectomy and axillary lymph node dissection EXAM: CT CHEST WITH CONTRAST TECHNIQUE: Multidetector CT imaging of the chest was performed during intravenous contrast administration. CONTRAST:  78m OMNIPAQUE IOHEXOL 300 MG/ML  SOLN COMPARISON:  MR breast, 09/11/2019, CT chest, 04/11/2019 FINDINGS: Cardiovascular: No significant vascular findings. Normal heart size. No pericardial effusion. Mediastinum/Nodes: There is indistinct soft tissue or matted lymph nodes about the right hilum and throughout the mediastinum (series 2, image 47, 58). There are newly enlarged para-aortic lymph nodes in the lower chest measuring up to 0.8 x 0.8 cm (series 2, image 105). Thyroid gland, trachea, and esophagus demonstrate no significant findings. Lungs/Pleura: There is a new moderate right pleural effusion with extensive interlobular septal thickening. There are numerous new small bilateral pulmonary nodules, measuring up to 4 mm in the superior segment left lower lobe (series 4, image 57). Upper Abdomen: There are numerous new low-attenuation lesions of the liver, measuring up to 1.4 cm in the liver dome (series 2, image 105). Musculoskeletal: There is a new, high-grade anterior wedge deformity of T1 with greater than 50% anterior height loss (series 6, image 75). IMPRESSION: 1. Numerous new small bilateral pulmonary nodules, measuring up to 4 mm in the superior segment left lower lobe. 2. New, moderate right pleural effusion with diffuse interlobular septal thickening, presumably malignant and reflecting lymphangitic spread of disease. 3. New indistinct soft tissue or matted lymph nodes about the right hilum and throughout the mediastinum, with newly enlarged para-aortic lymph nodes in the lower chest. 4. Numerous new low-attenuation lesions of the liver, measuring up to 1.4 cm in the liver dome.  5. New, high-grade anterior wedge deformity of T1 with greater than 50% anterior height loss. 6. Constellation of findings above is consistent with new, widespread metastatic disease. 7. Status post right mastectomy. These results will be called to the ordering clinician or representative by the Radiologist Assistant, and communication documented in the PACS or zVision Dashboard. Electronically Signed   By: AEddie CandleM.D.   On: 01/31/2020 16:45     ELIGIBLE FOR AVAILABLE RESEARCH PROTOCOL: no   ASSESSMENT: 34y.o. [place] woman status post right breast biopsy x2 and right axillary lymph node biopsy 04/19/2019 for a clinical T4 N2 MX, stage IIIC invasive ductal carcinoma, grade 3, triple negative, with an MIB-1 of 90%  (a) chest CT scan 04/11/2019 shows multiple subcentimeter lung nodules  (b) PET scan denied by insurance  (c) CT A/P and bone scan due 05/09/2019 show a very small low-density lesion in the lateral segment of the left liver which is indeterminate, and sclerosis of the right SI joint, which is felt to be likely degenerative; bone scan was entirely negative.  (c) baseline breast MRI 04/30/2019 shows involvement of all 4 quadrants in the right breast as well as right axillary subpectoral and internal mammary lymphadenopathy.  Left breast was unremarkable  (1) neoadjuvant chemotherapy consisting of  (a) pembrolizumab started 05/03/2019, third dose delayed because of elevated liver function tests, resumed 08/02/2019  (b) paclitaxel weekly x12 with carboplatin every 21 days x 4, started 05/08/2019   (  i) tolerated carboplatin AUC 5-day 1 poorly, switched to AUC 2 on day 1 day 8   (ii) cycle 2 delayed 1 week because of elevated liver function tests   (iii) carboplatin and paclitaxel discontinued after 07/03/2019 because of neuropathy, (received 7 weekly doses paclitaxel and one AUC5 carbo dose, four AUC2 doses)  (c) cyclophosphamide and doxorubicin x4 started 07/03/2019, completed  08/29/2019   (i) 2d dose given day 28 due to poor tolerance; other doses Q 21 days   (2) s/p right mastectomy and ALND 10/16/2019 showing a residual ypT2 ypN2 invasive ductal carcinoma, grade 3, with negative margins  (a) a total of 10 lymph nodes were removed, 4 with macrometastases, 1 with a micrometastasis  (3) adjuvant radiation to follow, consider capecitabine sensitization  (4) genetics testing  (a) genetic testing 05/14/2019 through the Common Hereditary Gene Panel offered by [facility] found no deleterious mutations in APC, ATM, AXIN2, BARD1, BMPR1A, BRCA1, BRCA2, BRIP1, CDH1, CDK4, CDKN2A (p14ARF), CDKN2A (p16INK4a), CHEK2, CTNNA1, DICER1, EPCAM (Deletion/duplication testing only), GREM1 (promoter region deletion/duplication testing only), KIT, MEN1, MLH1, MSH2, MSH3, MSH6, MUTYH, NBN, NF1, NHTL1, PALB2, PDGFRA, PMS2, POLD1, POLE, PTEN, RAD50, RAD51C, RAD51D, RNF43, SDHB, SDHC, SDHD, SMAD4, SMARCA4. STK11, TP53, TSC1, TSC2, and VHL.  The following genes were evaluated for sequence changes only: SDHA and HOXB13 c.251G>A variant only  (5) goserelin started 05/08/2019  (6) pembrolizumab started 08/02/2019, discontinued after fourth dose (10/02/2019) with psoriasis flare  (7) psoriasis:  (a) weekly methotrexate started 11/10/2019, held after 11/17/2019 dose so as not to overlap with radiation  (b) intravenous cyclophosphamide 1 g every 3 weeks starting 11/21/2019  (8) METASTATIC/PROGRESSIVE DISEASE: FEB 2021  (a) CT chest 01/31/2020 shows new lung and liver lesions, right pleural effusion, mediastinal adenopathy, T1 wedge deformity  (b) brain MRI  (9) to start capecitabine, initially at radiosensitizing doses    PLAN: [name] understands that stage IV breast cancer is not curable.  It is treatable although in triple negative disease the only treatment available is chemotherapy.  Since we cannot cure this disease, the goal is control and we generally aim for the minimum amount of  treatment necessary to control the tumor so that the patient can have the best possible quality of life.  With that in mind we will start her on capecitabine.  She is receiving radiation daily now and will continue through mid March.  Accordingly once we obtain the capecitabine we will start at radiosensitizing doses namely 2 tablets twice daily only on radiation days.  When she recovers from radiation we will start 3 tablets twice daily 1 week on 1 week off.  She is very short of breath and I am setting her up for right thoracentesis.  We will obtain cytology from that and we will repeat a prognostic panel on that material.  I have also set her up for a brain MRI.  I am appreciative of social work for meeting with the patient today as well.  At the next visit we will see if we can get a healthcare power of attorney completed and notarized.  She tells me she plans to name her parents as her healthcare powers of attorney.  Total encounter time 35 minutes.Sarajane Jews C. Mekiyah Gladwell, MD 02/04/20 12:23 PM Medical Oncology and Hematology [facility] [place],  [address] Tel. [phone]    Fax. [phone]   I, Wilburn Mylar, am acting as scribe for Dr. [name]. Hong Timm.  Lindie Spruce MD, have  reviewed the above documentation for accuracy and completeness, and I agree with the above.    *Total Encounter Time as defined by the Centers for Medicare and Medicaid Services includes, in addition to the face-to-face time of a patient visit (documented in the note above) non-face-to-face time: obtaining and reviewing outside history, ordering and reviewing medications, tests or procedures, care coordination (communications with other health care professionals or caregivers) and documentation in the medical record.

## 2020-02-04 ENCOUNTER — Ambulatory Visit
Admission: RE | Admit: 2020-02-04 | Discharge: 2020-02-04 | Disposition: A | Discharge status: Home / Self Care | Payer: Medicaid Other | Source: Ambulatory Visit | Attending: Radiation Oncology | Admitting: Radiation Oncology

## 2020-02-04 ENCOUNTER — Other Ambulatory Visit: Payer: Self-pay

## 2020-02-04 ENCOUNTER — Telehealth: Payer: Self-pay

## 2020-02-04 ENCOUNTER — Other Ambulatory Visit: Payer: Self-pay | Admitting: Radiation Oncology

## 2020-02-04 ENCOUNTER — Inpatient Hospital Stay: Payer: Medicaid Other | Attending: Oncology | Admitting: Oncology

## 2020-02-04 ENCOUNTER — Encounter: Payer: Self-pay | Admitting: Licensed Clinical Social Worker

## 2020-02-04 VITALS — BP 102/86 | HR 128 | Temp 98.7°F | Resp 20 | Ht 65.0 in | Wt 157.8 lb

## 2020-02-04 DIAGNOSIS — Z560 Unemployment, unspecified: Secondary | ICD-10-CM | POA: Insufficient documentation

## 2020-02-04 DIAGNOSIS — C7802 Secondary malignant neoplasm of left lung: Secondary | ICD-10-CM | POA: Diagnosis not present

## 2020-02-04 DIAGNOSIS — Z85818 Personal history of malignant neoplasm of other sites of lip, oral cavity, and pharynx: Secondary | ICD-10-CM | POA: Diagnosis not present

## 2020-02-04 DIAGNOSIS — J948 Other specified pleural conditions: Secondary | ICD-10-CM | POA: Diagnosis not present

## 2020-02-04 DIAGNOSIS — F17210 Nicotine dependence, cigarettes, uncomplicated: Secondary | ICD-10-CM | POA: Diagnosis not present

## 2020-02-04 DIAGNOSIS — R918 Other nonspecific abnormal finding of lung field: Secondary | ICD-10-CM | POA: Diagnosis not present

## 2020-02-04 DIAGNOSIS — R05 Cough: Secondary | ICD-10-CM | POA: Insufficient documentation

## 2020-02-04 DIAGNOSIS — Z800 Family history of malignant neoplasm of digestive organs: Secondary | ICD-10-CM | POA: Insufficient documentation

## 2020-02-04 DIAGNOSIS — F1290 Cannabis use, unspecified, uncomplicated: Secondary | ICD-10-CM | POA: Insufficient documentation

## 2020-02-04 DIAGNOSIS — G629 Polyneuropathy, unspecified: Secondary | ICD-10-CM | POA: Diagnosis not present

## 2020-02-04 DIAGNOSIS — Z820 Family history of epilepsy and other diseases of the nervous system: Secondary | ICD-10-CM | POA: Insufficient documentation

## 2020-02-04 DIAGNOSIS — C50811 Malignant neoplasm of overlapping sites of right female breast: Secondary | ICD-10-CM | POA: Diagnosis present

## 2020-02-04 DIAGNOSIS — Z5111 Encounter for antineoplastic chemotherapy: Secondary | ICD-10-CM | POA: Insufficient documentation

## 2020-02-04 DIAGNOSIS — Z833 Family history of diabetes mellitus: Secondary | ICD-10-CM | POA: Insufficient documentation

## 2020-02-04 DIAGNOSIS — Z841 Family history of disorders of kidney and ureter: Secondary | ICD-10-CM | POA: Diagnosis not present

## 2020-02-04 DIAGNOSIS — Z803 Family history of malignant neoplasm of breast: Secondary | ICD-10-CM | POA: Diagnosis not present

## 2020-02-04 DIAGNOSIS — Z9011 Acquired absence of right breast and nipple: Secondary | ICD-10-CM | POA: Diagnosis not present

## 2020-02-04 DIAGNOSIS — C773 Secondary and unspecified malignant neoplasm of axilla and upper limb lymph nodes: Secondary | ICD-10-CM | POA: Insufficient documentation

## 2020-02-04 DIAGNOSIS — Z609 Problem related to social environment, unspecified: Secondary | ICD-10-CM | POA: Insufficient documentation

## 2020-02-04 DIAGNOSIS — Z79899 Other long term (current) drug therapy: Secondary | ICD-10-CM | POA: Insufficient documentation

## 2020-02-04 DIAGNOSIS — L409 Psoriasis, unspecified: Secondary | ICD-10-CM | POA: Diagnosis not present

## 2020-02-04 DIAGNOSIS — Z171 Estrogen receptor negative status [ER-]: Secondary | ICD-10-CM | POA: Insufficient documentation

## 2020-02-04 DIAGNOSIS — M542 Cervicalgia: Secondary | ICD-10-CM | POA: Insufficient documentation

## 2020-02-04 DIAGNOSIS — Z808 Family history of malignant neoplasm of other organs or systems: Secondary | ICD-10-CM | POA: Insufficient documentation

## 2020-02-04 DIAGNOSIS — J910 Malignant pleural effusion: Secondary | ICD-10-CM | POA: Insufficient documentation

## 2020-02-04 DIAGNOSIS — C50911 Malignant neoplasm of unspecified site of right female breast: Secondary | ICD-10-CM

## 2020-02-04 MED ORDER — CAPECITABINE 500 MG PO TABS: 1000.0000 mg | ORAL_TABLET | Freq: Two times a day (BID) | ORAL | 1 refills | Status: DC

## 2020-02-04 NOTE — Progress Notes (Signed)
[facility] Clinical Social Work Progress Note  Clinical Social Work was referred by [employer] for assessment of psychosocial needs and support after new documented tumor spread.  Clinical Social Worker met with patient  to offer support and assess for needs.    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 CSW provided emotional support and validated feelings. [name] relies on God and focusing on what she can control, including taking care of her children and noticing their progress.   Additional concern with losing weight as she has decreased appetite and when she is hungry she has problems eating due to neck pain & swelling. Would like to be reconnected with nutrition for advice.  Goal currently is to treat as much as she can so she can live as long as possible for her kids.   CSW Summary:  Identifications of barriers to care: Financial and Adjustment to Illness  Clinical Social Worker follow up needed: Yes.   Brief Counseling/Psychotherapy, Data processing manager and Referral to [employer]  CSW will look into financial resources and continue to provide emotional support.     Daron Breeding, [place], [place] Worker [facility]

## 2020-02-04 NOTE — Telephone Encounter (Signed)
Oral Oncology Patient Advocate Encounter  After completing a benefits investigation, prior authorization for Xeloda is not required at this time through [facility].  Patient's copay is $3.     [place] Patient [place] Phone [phone] Fax [phone] 02/04/2020 1:13 PM

## 2020-02-05 ENCOUNTER — Ambulatory Visit
Admission: RE | Admit: 2020-02-05 | Discharge: 2020-02-05 | Disposition: A | Discharge status: Home / Self Care | Payer: Medicaid Other | Source: Ambulatory Visit | Attending: Radiation Oncology | Admitting: Radiation Oncology

## 2020-02-05 ENCOUNTER — Inpatient Hospital Stay (HOSPITAL_COMMUNITY): Admission: RE | Admit: 2020-02-05 | Payer: Medicaid Other | Source: Ambulatory Visit

## 2020-02-05 ENCOUNTER — Telehealth: Payer: Self-pay | Admitting: Licensed Clinical Social Worker

## 2020-02-05 ENCOUNTER — Other Ambulatory Visit: Payer: Self-pay

## 2020-02-05 DIAGNOSIS — C50811 Malignant neoplasm of overlapping sites of right female breast: Secondary | ICD-10-CM | POA: Diagnosis not present

## 2020-02-05 NOTE — Telephone Encounter (Signed)
LVM for patient informing her that she can pick up last [place] at support services any day this week. Requested return call to discuss further resources.

## 2020-02-06 ENCOUNTER — Encounter: Payer: Self-pay | Admitting: Pharmacist

## 2020-02-06 ENCOUNTER — Telehealth: Payer: Self-pay | Admitting: Pharmacist

## 2020-02-06 ENCOUNTER — Ambulatory Visit
Admission: RE | Admit: 2020-02-06 | Discharge: 2020-02-06 | Disposition: A | Discharge status: Home / Self Care | Payer: Medicaid Other | Source: Ambulatory Visit | Attending: Radiation Oncology | Admitting: Radiation Oncology

## 2020-02-06 ENCOUNTER — Other Ambulatory Visit: Payer: Self-pay

## 2020-02-06 DIAGNOSIS — C50811 Malignant neoplasm of overlapping sites of right female breast: Secondary | ICD-10-CM

## 2020-02-06 NOTE — Telephone Encounter (Signed)
Oral Oncology Pharmacist Encounter  Received new prescription for Xeloda (capecitabine) for the treatment of triple negative metastatic breast cancer, planned duration until disease progression or unacceptable drug toxicity. Patient is currently receiving RT so MD plans on starting her at radiosensitizing dosing the increasing the dose once radiation has ended and she has recovered.  CMP from 01/24/20 assessed, no relevant lab abnormalities. Prescription dose and frequency assessed.   Current medication list in Epic reviewed, two DDIs with capecitabine identified: - Omeprazole: Proton Pump Inhibitors (PPI) may diminish the therapeutic effect of capecitabine. Recommend evaluating the need for a PPI/acid suppression. If acid suppression is needed, recommend switching to a H2 antagonist (eg, famotidine) to avoid this DDI.  - Folic acid: Folic acid may increase the adverse effects of capecitabine. Consider stopping folic acid if patient has issues tolerating capecitabine.  Prescription has been e-scribed to the [facility] for benefits analysis and approval.  Oral Oncology Clinic will continue to follow for insurance authorization, copayment issues, initial counseling and start date.  Darl Pikes, PharmD, BCPS, [facility] Hematology/Oncology Clinical Pharmacist [facility] [place] [facility] [phone]  02/06/2020 10:18 AM

## 2020-02-06 NOTE — Telephone Encounter (Signed)
Encounter opened in error

## 2020-02-07 ENCOUNTER — Other Ambulatory Visit: Payer: Self-pay

## 2020-02-07 ENCOUNTER — Ambulatory Visit (HOSPITAL_COMMUNITY)
Admission: RE | Admit: 2020-02-07 | Discharge: 2020-02-07 | Disposition: A | Discharge status: Home / Self Care | Payer: Medicaid Other | Source: Ambulatory Visit | Attending: Student | Admitting: Student

## 2020-02-07 ENCOUNTER — Encounter: Payer: Self-pay | Admitting: Oncology

## 2020-02-07 ENCOUNTER — Ambulatory Visit
Admission: RE | Admit: 2020-02-07 | Discharge: 2020-02-07 | Disposition: A | Discharge status: Home / Self Care | Payer: Medicaid Other | Source: Ambulatory Visit | Attending: Radiation Oncology | Admitting: Radiation Oncology

## 2020-02-07 ENCOUNTER — Ambulatory Visit (HOSPITAL_COMMUNITY)
Admission: RE | Admit: 2020-02-07 | Discharge: 2020-02-07 | Disposition: A | Discharge status: Home / Self Care | Payer: Medicaid Other | Source: Ambulatory Visit | Attending: Oncology | Admitting: Oncology

## 2020-02-07 DIAGNOSIS — Z853 Personal history of malignant neoplasm of breast: Secondary | ICD-10-CM | POA: Diagnosis not present

## 2020-02-07 DIAGNOSIS — Z9889 Other specified postprocedural states: Secondary | ICD-10-CM

## 2020-02-07 DIAGNOSIS — J90 Pleural effusion, not elsewhere classified: Secondary | ICD-10-CM | POA: Diagnosis present

## 2020-02-07 DIAGNOSIS — C50811 Malignant neoplasm of overlapping sites of right female breast: Secondary | ICD-10-CM

## 2020-02-07 DIAGNOSIS — Z171 Estrogen receptor negative status [ER-]: Secondary | ICD-10-CM

## 2020-02-07 DIAGNOSIS — C782 Secondary malignant neoplasm of pleura: Secondary | ICD-10-CM | POA: Diagnosis not present

## 2020-02-07 MED ORDER — LIDOCAINE HCL 1 % IJ SOLN
INTRAMUSCULAR | Status: AC
  Filled 2020-02-07: qty 20

## 2020-02-07 NOTE — Procedures (Signed)
PROCEDURE SUMMARY:  Successful image-guided left thoracentesis. Yielded 800 milliliters of hazy gold fluid. Patient tolerated procedure well. No immediate complications. EBL < 5 mL.  Specimen was sent for labs. CXR ordered.  Please see imaging section of Epic for full dictation.   Earley Abide PA-C 02/07/2020 3:57 PM

## 2020-02-08 ENCOUNTER — Ambulatory Visit: Payer: Medicaid Other

## 2020-02-08 ENCOUNTER — Other Ambulatory Visit: Payer: Self-pay

## 2020-02-08 MED ORDER — CAPECITABINE 500 MG PO TABS: 1000.0000 mg | ORAL_TABLET | Freq: Two times a day (BID) | ORAL | 1 refills | Status: AC

## 2020-02-08 MED FILL — CAPECITABINE 500 MG TABLET: 500 | 28 days supply | Qty: 80 | Fill #0

## 2020-02-08 NOTE — Telephone Encounter (Signed)
Oral Chemotherapy Pharmacist Encounter  Patient plans on picking up her medication from [place] following her RT appt on Monday 02/11/20. She will start her Xeloda after picking up the medication by taking her first dose with her dinner on 02/11/20.  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 Patient is currently receiving RT so MD plans on starting her at radiosensitizing dosing the increasing the dose once radiation has ended and she has recovered  Counseled patient on administration, dosing, side effects, monitoring, drug-food interactions, safe handling, storage, and disposal. Patient will take 2 tablets (1,000 mg total) by mouth 2 (two) times daily after a meal. Take Monday-Friday. Take only on days of radiation.  Reviewed DDI with omeprazole with Ms. Jr, she reports only taking the omeprazole as needed. She agreed to stop the omeprazole and take famotidine if she needs medication for acid reflux.   Side effects include but not limited to: diarrhea, decreased wbc, hand-foot syndrome, fatigue, edema.    Reviewed with patient importance of keeping a medication schedule and plan for any missed doses.  [name] voiced understanding and appreciation. All questions answered. Medication handout placed in the mail.  Provided patient with Oral [place] [facility] phone number. Patient knows to call the office with questions or concerns. Oral Chemotherapy Navigation Clinic will continue to follow.  Darl Pikes, PharmD, BCPS, [facility] Hematology/Oncology Clinical Pharmacist [facility] [place] [facility] [phone]  02/08/2020 12:45 PM

## 2020-02-11 ENCOUNTER — Ambulatory Visit
Admission: RE | Admit: 2020-02-11 | Discharge: 2020-02-11 | Disposition: A | Discharge status: Home / Self Care | Payer: Medicaid Other | Source: Ambulatory Visit | Attending: Radiation Oncology | Admitting: Radiation Oncology

## 2020-02-11 ENCOUNTER — Other Ambulatory Visit: Payer: Self-pay

## 2020-02-11 ENCOUNTER — Telehealth: Payer: Self-pay | Admitting: *Deleted

## 2020-02-11 DIAGNOSIS — C50811 Malignant neoplasm of overlapping sites of right female breast: Secondary | ICD-10-CM | POA: Diagnosis not present

## 2020-02-11 NOTE — Telephone Encounter (Signed)
[place] request faxed per [facility]/NP - with confirmation as received.

## 2020-02-12 ENCOUNTER — Ambulatory Visit
Admission: RE | Admit: 2020-02-12 | Discharge: 2020-02-12 | Disposition: A | Discharge status: Home / Self Care | Payer: Medicaid Other | Source: Ambulatory Visit | Attending: Radiation Oncology | Admitting: Radiation Oncology

## 2020-02-12 ENCOUNTER — Other Ambulatory Visit: Payer: Self-pay | Admitting: Radiation Oncology

## 2020-02-12 ENCOUNTER — Other Ambulatory Visit: Payer: Self-pay

## 2020-02-12 DIAGNOSIS — C50811 Malignant neoplasm of overlapping sites of right female breast: Secondary | ICD-10-CM | POA: Diagnosis not present

## 2020-02-12 MED ORDER — PROMETHAZINE-CODEINE 6.25-10 MG/5ML PO SYRP: 5.0000 mL | ORAL_SOLUTION | Freq: Two times a day (BID) | ORAL | 0 refills | Status: DC | PRN

## 2020-02-13 ENCOUNTER — Ambulatory Visit (HOSPITAL_COMMUNITY): Payer: Medicaid Other

## 2020-02-13 ENCOUNTER — Other Ambulatory Visit: Payer: Self-pay

## 2020-02-13 ENCOUNTER — Ambulatory Visit
Admission: RE | Admit: 2020-02-13 | Discharge: 2020-02-13 | Disposition: A | Discharge status: Home / Self Care | Payer: Medicaid Other | Source: Ambulatory Visit | Attending: Radiation Oncology | Admitting: Radiation Oncology

## 2020-02-13 ENCOUNTER — Telehealth: Payer: Self-pay | Admitting: Oncology

## 2020-02-13 DIAGNOSIS — C50811 Malignant neoplasm of overlapping sites of right female breast: Secondary | ICD-10-CM | POA: Diagnosis not present

## 2020-02-13 NOTE — Telephone Encounter (Signed)
I left a message regarding reschedule due to weather

## 2020-02-14 ENCOUNTER — Ambulatory Visit: Payer: Medicaid Other

## 2020-02-14 ENCOUNTER — Telehealth: Payer: Self-pay | Admitting: Nutrition

## 2020-02-14 ENCOUNTER — Inpatient Hospital Stay: Payer: Medicaid Other

## 2020-02-14 ENCOUNTER — Inpatient Hospital Stay: Payer: Medicaid Other | Admitting: Oncology

## 2020-02-14 ENCOUNTER — Inpatient Hospital Stay: Payer: Medicaid Other | Admitting: Nutrition

## 2020-02-14 NOTE — Progress Notes (Signed)
[place]  [phone] [phone]    ID: Terri Estes DOB: [birth year]  MR#: [id number]  TTS#:[id number]  Patient Care Team: Patient, No Pcp Per as PCP - General (General Practice) Alphonsa Overall, MD as Consulting Physician (General Surgery) Aijalon Kirtz, [name], MD as Consulting Physician (Oncology) Kyung Rudd, MD as Consulting Physician (Radiation Oncology) Rockwell Germany, RN as Oncology Nurse Navigator Mauro Kaufmann, RN as Oncology Nurse Navigator Donnamae Jude, MD as Consulting Physician (Obstetrics and Gynecology) Pavelock, Ralene Bathe, MD as Consulting Physician (Internal Medicine) OTHER MD: Joylene Igo for [facility] is [phone]   CHIEF COMPLAINT: 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  Her significant other Terri Estes participated by [employer]  She had been on methotrexate initially for her psoriasis, then switched to cyclophosphamide so as not to compromise her radiation.  Her last cyclophosphamide dose was 01/24/2020.  This is worked well for her and the psoriasis is resolving.  However she says she does not want any more Cytoxan.  There is just too much going on and there has been some tension because it is so difficult for her to get here on time and then she is late for treatments.  She is currently receiving radiation therapy treatments under Dr. Lisbeth Renshaw. She started on 01/23/2020 and is scheduled to finish on 03/13/2020.  This makes her feel very tired.  So far though her skin is holding up.  Since her last visit, she underwent therapeutic thoracentesis on 02/07/2020, which yielded 800 mL of fluid. Cytology of the fluid ([id number]) confirmed malignant cells consistent with metastatic adenocarcinoma. Immunohistochemistry  is positive for GATA-3. The cells are negative for GCDFP, ER, and PR. The GATA-3 positivity is consistent with a breast primary. Her2 has been requested.  She started capecitabine on Monday, 02/13/2020.  She is taking 2 tablets twice daily only on radiation days.  She had an appointment with dermatology at [facility] on 02/25/2020 but she says that was canceled and she needs to reschedule it   REVIEW OF SYSTEMS: Terri Estes does not feel well.  According to Terri Estes also she is doing a lot of sleeping at home, she cannot really walk without getting short of breath and she says removing the fluid actually made things worse rather than better.  She is having more pain as well.  She has terrible coughing and sometimes it is a paroxysm and wakes her up.  She is taking Mucinex for this which may help a little.  She is having significant transportation issues.  Today our social worker did meet with her and gave her some transportation assistance but it is difficult for her to get here on a daily basis for her radiation treatments.  Terri Estes feels it is very difficult for her to deal with all the bad news she is getting with no one here with her--this is to of course to the limitations because of the current pandemic.  A detailed review of systems today was otherwise stable   HISTORY OF CURRENT ILLNESS: From the original intake note:  Terri Estes presented with swelling along the left side of the face with neck pain. She then underwent a neck CT on 04/11/2019 showing: Enlarged left level 2 lymph nodes with associated inflammatory change including stranding of the adjacent fat and thickening of the platysma. While malignancy  is not excluded, this is most concerning for infection. No focal abscess is present. Hypoattenuation within 1 of the left level 2 lymph nodes likely represents central necrosis or suppuration of the node. No primary malignancy or other abscess. Left upper lobe pulmonary nodules may be inflammatory.  The largest measures 0.7 x 0.7 x 0.6 cm. CT of the chest with contrast may be useful for further evaluation.  She also underwent a chest CT on the same day showing: Complex right breast mass measuring 5.6 x 4.5 x 4.7 cm consistent with a primary breast carcinoma. Malignant right axillary adenopathy measuring up to 3.1 cm. Multiple bilateral pulmonary nodules are concerning for metastatic disease. Given the right breast mass, the left cervical lymph nodes are more likely malignant.  She then underwent bilateral diagnostic mammography with tomography and right breast ultrasonography at [facility] on 04/19/2019 showing: Breast Density Category C; findings which are highly suspicious for multicentric inflammatory right breast cancer with right axillary nodal metastatic disease; no mammographic evidence of malignancy involving the left breast.  Accordingly on 04/19/2019 she proceeded to biopsy of the right breast area in question. The pathology from this procedure showed ([id number]): invasive ductal carcinoma, grade III, upper inner quadrant, 12:30 o'clock, 5.0 cm from the nipple. Prognostic indicators significant for: estrogen receptor, 0% negative and progesterone receptor, 0% negative. Proliferation marker Ki67 at 90%. HER2 negative (0+).  Additional biopsies of the right breast and right axilla were performed on the same day. The pathology from this procedure showed ([id number]): 2. Breast, right, needle core biopsy, satellite mass UOQ, 10 o'clock, 8cmfn  - Invasive ductal carcinoma, grade III 3. Lymph node, needle/core biopsy, level 1 right axilla   - Invasive ductal carcinoma, grade III  The patient's subsequent history is as detailed below.   PAST MEDICAL HISTORY: Past Medical History:  Diagnosis Date  . Anemia    after pregnancy  . Anxiety    severe not taking meds  . Asthma    inhaler  4x per day  . Cancer ([place])   . Chronic pain   . Depression   . Family history of breast  cancer   . GERD (gastroesophageal reflux disease)   . History of substance abuse ([place])   . Pregnancy induced hypertension    takes medication now.  . Psoriasis    all over body  . Trichomonas infection   . Vaginal Pap smear, abnormal     PAST SURGICAL HISTORY: Past Surgical History:  Procedure Laterality Date  . CESAREAN SECTION  05/26/2008  . CESAREAN SECTION N/A 07/10/2017   Procedure: CESAREAN SECTION;  Surgeon: Florian Buff, MD;  Location: [place];  Service: Obstetrics;  Laterality: N/A;  . CESAREAN SECTION N/A 09/23/2018   Procedure: CESAREAN SECTION;  Surgeon: Donnamae Jude, MD;  Location: [place];  Service: Obstetrics;  Laterality: N/A;  . COLPOSCOPY    . MASTECTOMY WITH AXILLARY LYMPH NODE DISSECTION Right 10/16/2019   Procedure: RIGHT MASTECTOMY WITH AXILLARY LYMPH NODE DISSECTION;  Surgeon: Alphonsa Overall, MD;  Location: [place];  Service: General;  Laterality: Right;  . PORTACATH PLACEMENT Left 04/30/2019   Procedure: INSERTION PORT-A-CATH WITH ULTRASOUND;  Surgeon: Alphonsa Overall, MD;  Location: [facility];  Service: General;  Laterality: Left;  Left Subclavian vein  . WISDOM TOOTH EXTRACTION      FAMILY HISTORY: Family History  Problem Relation Age of Onset  . Kidney disease Maternal Grandmother   . Breast cancer Maternal Grandmother 83  metastatic to liver; d. 96  . Kidney disease Maternal Grandfather   . Kidney disease Paternal Grandmother   . Diabetes Paternal Grandmother   . Breast cancer Paternal Grandmother   . Kidney disease Paternal Grandfather   . Breast cancer Paternal Aunt   . Cerebral palsy Child   . Breast cancer Paternal Great-grandmother   . Pancreatic cancer Other        PGMs brother   . Throat cancer Paternal Uncle   . Breast cancer Other        PGMs sister  (Updated 04/25/2019) Berlynn's father is living at age 64. Patients' mother is also living at age 75. [place] notes, however, that she was raised by her  grandmother.) The patient has 0 brothers and 5 sisters. Patient denies anyone in her family having ovarian, prostate, or pancreatic cancer. Her maternal grandmother was diagnosed with breast cancer, unsure what age. On her father's side, she reports her grandmother had cysts in her breasts, her great-aunt might have had breast cancer, and her great-grandmother was diagnosed with breast cancer at an older age.   GYNECOLOGIC HISTORY:  No LMP recorded. (Menstrual status: Chemotherapy).  She had an emergent C-section in 09/23/2018 for abruptio placenta at 30 weeks pregnancy, also has a history of eclampsia Menarche:    years old Age at first live birth: 34 years old [place] P: 3 LMP: 04/18/2019 Contraceptive: On goserelin Hysterectomy?: no BSO?: no   SOCIAL HISTORY:  Terri Estes is currently unemployed. She previously worked at [employer]. She is engaged. Fiance [name] runs a Midwife and works other jobs.  The patient recently moved to [place].  Daughter Demetrius Charity, age 86, has cerebral palsy. Son [name], age 64, has severe asthma and is developmentally disabled. Daughter Yetta Glassman was born on 09/23/2018 prematurely and remains on oxygen at home.  The patient's mother lives in [place] and frequently comes by to assist.  ADVANCED DIRECTIVES: not in place.    HEALTH MAINTENANCE: Social History   Tobacco Use  . Smoking status: Current Every Day Smoker    Packs/day: 0.10    Years: 0.00    Pack years: 0.00    Types: Cigarettes  . Smokeless tobacco: Never Used  . Tobacco comment: trying to quit - smokes 2 cigarettes/day  Substance Use Topics  . Alcohol use: No  . Drug use: Yes    Frequency: 7.0 times per week    Types: Marijuana    Comment: subutex last dose 10/15/19, last dose Sun 10/14/19    Colonoscopy: n/a  PAP: 05/10/2018  Bone density: never done Mammography: first performed following abnormal CT  No Known Allergies   Current Outpatient Medications  Medication Sig  Dispense Refill  . acyclovir ointment (ZOVIRAX) 5 % Apply 1 application topically 2 (two) times daily as needed. Mix 1 part acyclovir cream with 1 part over the counter hydrocortisone 1% cream and apply to affected area two times a day as needed 5 g 0  . albuterol (VENTOLIN HFA) 108 (90 Base) MCG/ACT inhaler Inhale 2 puffs into the lungs every 6 (six) hours as needed for wheezing or shortness of breath. 8 g 6  . albuterol (VENTOLIN HFA) 108 (90 Base) MCG/ACT inhaler Inhale 2 puffs into the lungs 4 (four) times daily. 8 g 3  . buprenorphine (SUBUTEX) 8 MG SUBL SL tablet Place 8 mg under the tongue 2 (two) times daily.     . capecitabine (XELODA) 500 MG tablet Take 2 tablets (1,000 mg total) by mouth 2 (two)  times daily after a meal. Take Monday-Friday. Take only on days of radiation. 80 tablet 1  . cholestyramine (QUESTRAN) 4 g packet Take 1 packet (4 g total) by mouth 3 (three) times daily with meals. 60 each 12  . cyclobenzaprine (FLEXERIL) 10 MG tablet Take 1 tablet (10 mg total) by mouth 3 (three) times daily as needed for muscle spasms. 90 tablet 2  . dexamethasone (DECADRON) 4 MG tablet Take 1 tablet (4 mg total) by mouth daily with breakfast. 30 tablet 0  . doxycycline (VIBRA-TABS) 100 MG tablet Take 1 tablet (100 mg total) by mouth 2 (two) times daily. 20 tablet 0  . folic acid (FOLVITE) 1 MG tablet Take 1 tablet (1 mg total) by mouth daily. 60 tablet 3  . gabapentin (NEURONTIN) 300 MG capsule TAKE 1 CAPSULE(300 MG) BY MOUTH THREE TIMES DAILY (Patient taking differently: Take 300 mg by mouth 3 (three) times daily. ) 90 capsule 0  . hydrocortisone (ANUSOL-HC) 2.5 % rectal cream Place 1 application rectally 2 (two) times daily. 60 g 0  . ketoconazole (NIZORAL) 2 % cream Apply 1 application topically daily. 15 g 0  . metoCLOPramide (REGLAN) 10 MG tablet Take 1 tablet (10 mg total) by mouth every 8 (eight) hours as needed for nausea. 30 tablet 0  . ondansetron (ZOFRAN) 8 MG tablet Take 1 tablet (8  mg total) by mouth every 8 (eight) hours as needed for nausea or vomiting. 20 tablet 0  . PARoxetine (PAXIL) 10 MG tablet Take 1 tablet (10 mg total) by mouth daily. 30 tablet 5  . Prenatal MV-Min-FA-Omega-3 (PRENATAL GUMMIES/DHA & FA) 0.4-32.5 MG CHEW Chew 2 each by mouth daily.    . promethazine (PHENERGAN) 25 MG suppository Place 1 suppository (25 mg total) rectally every 6 (six) hours as needed for nausea or vomiting. 12 each 0  . promethazine-codeine (PHENERGAN WITH CODEINE) 6.25-10 MG/5ML syrup Take 5 mLs by mouth 2 (two) times daily as needed for cough. 180 mL 0  . valACYclovir (VALTREX) 1000 MG tablet Take 1 tablet (1,000 mg total) by mouth 2 (two) times daily. 60 tablet 0   No current facility-administered medications for this visit.   Facility-Administered Medications Ordered in Other Visits  Medication Dose Route Frequency Provider Last Rate Last Admin  . 0.9 % NaCl with KCl 20 mEq/ L  infusion   Intravenous Once Jquan Egelston, [name], MD      . sodium chloride flush (NS) 0.9 % injection 10 mL  10 mL Intracatheter PRN Colsen Modi, [name], MD        OBJECTIVE: Young African-American Estes examined in a wheelchair  Vitals:   02/15/20 1145  BP: 104/74  Pulse: (!) 137  Resp: 20  Temp: 98.7 F (37.1 C)  SpO2: 95%   Wt Readings from Last 3 Encounters:  02/15/20 144 lb (65.3 kg)  02/04/20 157 lb 12.8 oz (71.6 kg)  01/24/20 159 lb 4.8 oz (72.3 kg)   Body mass index is 23.96 kg/m.    ECOG FS:1 - Symptomatic but completely ambulatory  Sclerae unicteric, EOMs intact Wearing a mask No cervical or supraclavicular adenopathy Lungs no rales or rhonchi but decreased breath sounds on the right Heart regular rate and rhythm Abd soft, nontender, positive bowel sounds Neuro: nonfocal Breasts: The right breast is status post mastectomy and is currently receiving radiation. Skin: The widespread psoriatic lesions are flatter, no longer erythematous, and continue to peel.  LAB  RESULTS:  CMP     Component Value Date/Time  NA 139 02/15/2020 1120   NA 137 05/10/2018 1535   K 3.7 02/15/2020 1120   CL 99 02/15/2020 1120   CO2 27 02/15/2020 1120   GLUCOSE 116 (H) 02/15/2020 1120   BUN 7 02/15/2020 1120   BUN 7 05/10/2018 1535   CREATININE 0.79 02/15/2020 1120   CREATININE 0.81 07/03/2019 0919   CALCIUM 9.3 02/15/2020 1120   PROT 7.7 02/15/2020 1120   PROT 7.2 05/10/2018 1535   ALBUMIN 3.0 (L) 02/15/2020 1120   ALBUMIN 4.2 05/10/2018 1535   AST 59 (H) 02/15/2020 1120   AST 54 (H) 07/03/2019 0919   ALT 37 02/15/2020 1120   ALT 102 (H) 07/03/2019 0919   ALKPHOS 115 02/15/2020 1120   BILITOT 0.7 02/15/2020 1120   BILITOT <0.2 (L) 07/03/2019 0919   GFRNONAA >60 02/15/2020 1120   GFRNONAA >60 07/03/2019 0919   GFRAA >60 02/15/2020 1120   GFRAA >60 07/03/2019 0919    No results found for: TOTALPROTELP, ALBUMINELP, A1GS, A2GS, BETS, BETA2SER, GAMS, MSPIKE, SPEI  No results found for: KPAFRELGTCHN, LAMBDASER, KAPLAMBRATIO  Lab Results  Component Value Date   WBC 8.7 02/15/2020   NEUTROABS 7.4 02/15/2020   HGB 11.6 (L) 02/15/2020   HCT 36.5 02/15/2020   MCV 89.9 02/15/2020   PLT 315 02/15/2020    No results found for: LABCA2  No components found for: QXIHWT888  No results for input(s): INR in the last 168 hours.  No results found for: LABCA2  No results found for: CAN199  No results found for: KCM034  No results found for: JZP915  No results found for: CA2729  No components found for: HGQUANT  Lab Results  Component Value Date   CEA1 4.44 02/15/2020   /  CEA ([facility])  Date Value Ref Range Status  02/15/2020 4.44 0.00 - 5.00 ng/mL Final    Comment:    (NOTE) This test was performed using [employer]'s Chemiluminescent Microparticle Immunoassay. Values obtained from different assay methods cannot be used interchangeably. Please note that 5-10% of patients who smoke may see CEA levels up to 6.9 ng/mL. Performed at [facility], [place] [address] [place], [place] [address]      No results found for: AFPTUMOR  No results found for: [facility]  Lab Results  Component Value Date   HGBA 97.4 12/16/2016   (Hemoglobinopathy evaluation)   No results found for: LDH  No results found for: IRON, TIBC, IRONPCTSAT (Iron and TIBC)  Lab Results  Component Value Date   FERRITIN 900 (H) 09/12/2019    Urinalysis    Component Value Date/Time   COLORURINE YELLOW 11/29/2019 0138   APPEARANCEUR HAZY (A) 11/29/2019 0138   LABSPEC 1.020 11/29/2019 0138   PHURINE 6.0 11/29/2019 0138   GLUCOSEU NEGATIVE 11/29/2019 0138   HGBUR NEGATIVE 11/29/2019 0138   BILIRUBINUR NEGATIVE 11/29/2019 0138   KETONESUR 5 (A) 11/29/2019 0138   PROTEINUR NEGATIVE 11/29/2019 0138   UROBILINOGEN 4.0 (H) 01/13/2018 1417   NITRITE NEGATIVE 11/29/2019 0138   LEUKOCYTESUR MODERATE (A) 11/29/2019 0138     STUDIES:  DG Chest 1 View  Result Date: 02/07/2020 CLINICAL DATA:  Status post right thoracentesis EXAM: CHEST  1 VIEW COMPARISON:  CT chest, 01/31/2020, chest radiograph, 10/30/2019 FINDINGS: Left chest port catheter. The heart and mediastinum are unremarkable. Moderate right pleural effusion with associated atelectasis or consolidation. No pneumothorax. Trace left pleural effusion. IMPRESSION: Moderate right pleural effusion with associated atelectasis or consolidation. No pneumothorax. Electronically Signed   By:  Eddie Candle M.D.   On: 02/07/2020 15:20   CT Soft Tissue Neck W Contrast  Result Date: 01/31/2020 CLINICAL DATA:  Cough for 2 months. EXAM: CT NECK WITH CONTRAST TECHNIQUE: Multidetector CT imaging of the neck was performed using the standard protocol following the bolus administration of intravenous contrast. CONTRAST:  87m OMNIPAQUE IOHEXOL 300 MG/ML  SOLN COMPARISON:  04/11/2019 CT neck FINDINGS: PHARYNX AND LARYNX: --Nasopharynx: Fossae of Rosenmuller are clear. Normal adenoid tonsils for age.  --Oral cavity and oropharynx: The palatine and lingual tonsils are normal. The visible oral cavity and floor of mouth are normal. --Hypopharynx: Normal vallecula and pyriform sinuses. --Larynx: Medial deviation of the left vocal fold with dilated left laryngeal ventricle and piriform sinus. --Retropharyngeal space: No abscess, effusion or lymphadenopathy. SALIVARY GLANDS: --Parotid: No mass lesion or inflammation. No sialolithiasis or ductal dilatation. --Submandibular: Symmetric without inflammation. No sialolithiasis or ductal dilatation. --Sublingual: Normal. No ranula or other visible lesion of the base of tongue and floor of mouth. THYROID: Normal. LYMPH NODES: No enlarged or abnormal density lymph nodes. VASCULAR: Major cervical vessels are patent. LIMITED INTRACRANIAL: Normal. VISUALIZED ORBITS: Normal. MASTOIDS AND VISUALIZED PARANASAL SINUSES: No fluid levels or advanced mucosal thickening. No mastoid effusion. SKELETON: T1 compression deformity is new since 04/11/2019. UPPER CHEST: Large right pleural effusion. 4 mm nodule the right upper lobe (series 2, image 100). OTHER: None. IMPRESSION: 1. No acute abnormality of the neck. 2. Large right pleural effusion. This and right upper lobe pulmonary nodules are more completely characterized on the concomitant CT of the chest. Please see dedicated report for that study. 3. 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 4. T1 compression deformity, new since 04/11/2019. Electronically Signed   By: KUlyses JarredM.D.   On: 01/31/2020 20:20   CT Chest W Contrast  Result Date: 01/31/2020 CLINICAL DATA:  Breast cancer staging, cough, status post right mastectomy and axillary lymph node dissection EXAM: CT CHEST WITH CONTRAST TECHNIQUE: Multidetector CT imaging of the chest was performed during intravenous contrast administration. CONTRAST:  753mOMNIPAQUE IOHEXOL 300 MG/ML  SOLN COMPARISON:  MR breast, 09/11/2019, CT chest, 04/11/2019 FINDINGS:  Cardiovascular: No significant vascular findings. Normal heart size. No pericardial effusion. Mediastinum/Nodes: There is indistinct soft tissue or matted lymph nodes about the right hilum and throughout the mediastinum (series 2, image 47, 58). There are newly enlarged para-aortic lymph nodes in the lower chest measuring up to 0.8 x 0.8 cm (series 2, image 105). Thyroid gland, trachea, and esophagus demonstrate no significant findings. Lungs/Pleura: There is a new moderate right pleural effusion with extensive interlobular septal thickening. There are numerous new small bilateral pulmonary nodules, measuring up to 4 mm in the superior segment left lower lobe (series 4, image 57). Upper Abdomen: There are numerous new low-attenuation lesions of the liver, measuring up to 1.4 cm in the liver dome (series 2, image 105). Musculoskeletal: There is a new, high-grade anterior wedge deformity of T1 with greater than 50% anterior height loss (series 6, image 75). IMPRESSION: 1. Numerous new small bilateral pulmonary nodules, measuring up to 4 mm in the superior segment left lower lobe. 2. New, moderate right pleural effusion with diffuse interlobular septal thickening, presumably malignant and reflecting lymphangitic spread of disease. 3. New indistinct soft tissue or matted lymph nodes about the right hilum and throughout the mediastinum, with newly enlarged para-aortic lymph nodes in the lower chest. 4. Numerous new low-attenuation lesions of the liver, measuring up to 1.4 cm in the liver dome.  5. New, high-grade anterior wedge deformity of T1 with greater than 50% anterior height loss. 6. Constellation of findings above is consistent with new, widespread metastatic disease. 7. Status post right mastectomy. These results will be called to the ordering clinician or representative by the Radiologist Assistant, and communication documented in the PACS or zVision Dashboard. Electronically Signed   By: Eddie Candle M.D.   On:  01/31/2020 16:45   US Thoracentesis Asp Pleural space w/IMG guide  Result Date: 02/07/2020 INDICATION: Patient with history of breast cancer, dyspnea, and right pleural effusion. Request is made for diagnostic and therapeutic right thoracentesis. EXAM: ULTRASOUND GUIDED DIAGNOSTIC AND THERAPEUTIC RIGHT THORACENTESIS MEDICATIONS: 15 mL 1% lidocaine COMPLICATIONS: None immediate. PROCEDURE: An ultrasound guided thoracentesis was thoroughly discussed with the patient and questions answered. The benefits, risks, alternatives and complications were also discussed. The patient understands and wishes to proceed with the procedure. Written consent was obtained. Ultrasound was performed to localize and mark an adequate pocket of fluid in the right chest. The area was then prepped and draped in the normal sterile fashion. 1% Lidocaine was used for local anesthesia. Under ultrasound guidance a 6 Fr Safe-T-Centesis catheter was introduced by Dr. Laurence Ferrari. Thoracentesis was performed. The catheter was removed and a dressing applied. FINDINGS: A total of approximately 800 mL of hazy gold fluid was removed. Samples were sent to the laboratory as requested by the clinical team. IMPRESSION: Successful ultrasound guided right thoracentesis yielding 800 mL of pleural fluid. Read by: Earley Abide, PA-C Electronically Signed   By: Jacqulynn Cadet M.D.   On: 02/07/2020 16:27     ELIGIBLE FOR AVAILABLE RESEARCH PROTOCOL: no   ASSESSMENT: 34 y.o. Terri Estes status post right breast biopsy x2 and right axillary lymph node biopsy 04/19/2019 for a clinical T4 N2 MX, stage IIIC invasive ductal carcinoma, grade 3, triple negative, with an MIB-1 of 90%  (a) chest CT scan 04/11/2019 shows multiple subcentimeter lung nodules  (b) PET scan denied by insurance  (c) CT A/P and bone scan due 05/09/2019 show a very small low-density lesion in the lateral segment of the left liver which is indeterminate, and sclerosis of the  right SI joint, which is felt to be likely degenerative; bone scan was entirely negative.  (c) baseline breast MRI 04/30/2019 shows involvement of all 4 quadrants in the right breast as well as right axillary subpectoral and internal mammary lymphadenopathy.  Left breast was unremarkable  (1) neoadjuvant chemotherapy consisting of  (a) pembrolizumab started 05/03/2019, third dose delayed because of elevated liver function tests, resumed 08/02/2019  (b) paclitaxel weekly x12 with carboplatin every 21 days x 4, started 05/08/2019   (i) tolerated carboplatin AUC 5-day 1 poorly, switched to AUC 2 on day 1 day 8   (ii) cycle 2 delayed 1 week because of elevated liver function tests   (iii) carboplatin and paclitaxel discontinued after 07/03/2019 because of neuropathy, (received 7 weekly doses paclitaxel and one AUC5 carbo dose, four AUC2 doses)  (c) cyclophosphamide and doxorubicin x4 started 07/03/2019, completed 08/29/2019   (i) 2d dose given day 28 due to poor tolerance; other doses Q 21 days   (2) s/p right mastectomy and ALND 10/16/2019 showing a residual ypT2 ypN2 invasive ductal carcinoma, grade 3, with negative margins  (a) a total of 10 lymph nodes were removed, 4 with macrometastases, 1 with a micrometastasis  (3) adjuvant radiation to follow, consider capecitabine sensitization  (4) genetics testing  (a) genetic testing 05/14/2019 through the [facility]  Gene Panel offered by [facility] found no deleterious mutations in APC, ATM, AXIN2, BARD1, BMPR1A, BRCA1, BRCA2, BRIP1, CDH1, CDK4, CDKN2A (p14ARF), CDKN2A (p16INK4a), CHEK2, CTNNA1, DICER1, EPCAM (Deletion/duplication testing only), GREM1 (promoter region deletion/duplication testing only), KIT, MEN1, MLH1, MSH2, MSH3, MSH6, MUTYH, NBN, NF1, NHTL1, PALB2, PDGFRA, PMS2, POLD1, POLE, PTEN, RAD50, RAD51C, RAD51D, RNF43, SDHB, SDHC, SDHD, SMAD4, SMARCA4. STK11, TP53, TSC1, TSC2, and VHL.  The following genes were evaluated for sequence  changes only: SDHA and HOXB13 c.251G>A variant only  (5) goserelin started 05/08/2019  (6) pembrolizumab started 08/02/2019, discontinued after fourth dose (10/02/2019) with psoriasis flare  (7) psoriasis:  (a) weekly methotrexate started 11/10/2019, held after 11/17/2019 dose so as not to overlap with radiation  (b) intravenous cyclophosphamide 1 g every 3 weeks starting 11/21/2019  (8) METASTATIC/PROGRESSIVE DISEASE: FEB 2021  (a) CT chest 01/31/2020 shows new lung and liver lesions, right pleural effusion, mediastinal adenopathy, T1 wedge deformity  (b) brain MRI  (c) right thoracentesis 02/07/2020 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  She is now also receiving radiosensitization with capecitabine.  She is aware of how to take those medications appropriately and not to take them on nonradiation days.  Currently she is doing a lot of sleeping, cannot really do much at home to help the family, feels frustrated and depressed and anxious.  There are also social issues including significant transportation problems with social work is helping [place] with.  Her breathing did not improve with a thoracentesis and in fact her cough got worse.  The fluid will likely reaccumulate and we will be back to the way it was before.  She may actually be more comfortable with some fluid in the long then without.  We do have the prognostic panel on the cytology cells and they remain triple negative.  Once she is done with radiation we can consider upping the capecitabine to 3 tablets twice daily 1 week on 1 week off.  A second possibility would be sacituzumab.  We have also sent the cytology from the recent thoracentesis for Caris testing to see if we have other possible targets.  Otherwise we would have to go back to some form of chemotherapy  Our social  workers are trying to help with transportation.  Once radiation is over of course this will be less of an issue.  I am canceling the cyclophosphamide.  The capecitabine may have some effect on the psoriasis and she will have follow-up in [facility] as well.  She continues on goserelin/Zoladex, with her next injection due 02/21/2020.  I will see her again with her March Zoladex dose.  We should have the Caris results and brain MRI results by then.  She knows to call us for any other issue that may develop before that time.  Total encounter time 35 minutes.Terri Jews C. Leighann Amadon, MD 02/15/20 2:24 PM Medical Oncology and Hematology [facility] [place],  [address] Tel. [phone]    Fax. [phone]   I, Wilburn Mylar, am acting as scribe for Dr. [name]. Ahmarion Saraceno.  I, Lurline Del MD, have reviewed the above documentation for accuracy and completeness, and I agree with the above.   *Total Encounter Time as defined by the Centers for Medicare and Medicaid Services includes, in addition to the face-to-face time of a patient visit (documented in the note above) non-face-to-face  time: obtaining and reviewing outside history, ordering and reviewing medications, tests or procedures, care coordination (communications with other health care professionals or caregivers) and documentation in the medical record.

## 2020-02-14 NOTE — Telephone Encounter (Signed)
Contacted patient by telephone for nutrition follow up. She was not available but left a message for her to return call with my name and number.

## 2020-02-15 ENCOUNTER — Inpatient Hospital Stay (HOSPITAL_BASED_OUTPATIENT_CLINIC_OR_DEPARTMENT_OTHER): Payer: Medicaid Other | Admitting: Oncology

## 2020-02-15 ENCOUNTER — Other Ambulatory Visit: Payer: Self-pay

## 2020-02-15 ENCOUNTER — Inpatient Hospital Stay: Payer: Medicaid Other | Admitting: Licensed Clinical Social Worker

## 2020-02-15 ENCOUNTER — Ambulatory Visit
Admission: RE | Admit: 2020-02-15 | Discharge: 2020-02-15 | Disposition: A | Discharge status: Home / Self Care | Payer: Medicaid Other | Source: Ambulatory Visit | Attending: Radiation Oncology | Admitting: Radiation Oncology

## 2020-02-15 ENCOUNTER — Inpatient Hospital Stay: Payer: Medicaid Other

## 2020-02-15 VITALS — BP 104/74 | HR 137 | Temp 98.7°F | Resp 20 | Ht 65.0 in | Wt 144.0 lb

## 2020-02-15 DIAGNOSIS — G620 Drug-induced polyneuropathy: Secondary | ICD-10-CM

## 2020-02-15 DIAGNOSIS — C50811 Malignant neoplasm of overlapping sites of right female breast: Secondary | ICD-10-CM

## 2020-02-15 DIAGNOSIS — Z171 Estrogen receptor negative status [ER-]: Secondary | ICD-10-CM | POA: Diagnosis not present

## 2020-02-15 DIAGNOSIS — L408 Other psoriasis: Secondary | ICD-10-CM | POA: Diagnosis not present

## 2020-02-15 DIAGNOSIS — Z5111 Encounter for antineoplastic chemotherapy: Secondary | ICD-10-CM | POA: Diagnosis not present

## 2020-02-15 DIAGNOSIS — T451X5A Adverse effect of antineoplastic and immunosuppressive drugs, initial encounter: Secondary | ICD-10-CM

## 2020-02-15 LAB — COMPREHENSIVE METABOLIC PANEL WITH GFR
ALT: 37 U/L (ref 0–44)
AST: 59 U/L — ABNORMAL HIGH (ref 15–41)
Albumin: 3 g/dL — ABNORMAL LOW (ref 3.5–5.0)
Alkaline Phosphatase: 115 U/L (ref 38–126)
Anion gap: 13 (ref 5–15)
BUN: 7 mg/dL (ref 6–20)
CO2: 27 mmol/L (ref 22–32)
Calcium: 9.3 mg/dL (ref 8.9–10.3)
Chloride: 99 mmol/L (ref 98–111)
Creatinine, Ser: 0.79 mg/dL (ref 0.44–1.00)
GFR calc Af Amer: >60 mL/min
GFR calc non Af Amer: >60 mL/min
Glucose, Bld: 116 mg/dL — ABNORMAL HIGH (ref 70–99)
Potassium: 3.7 mmol/L (ref 3.5–5.1)
Sodium: 139 mmol/L (ref 135–145)
Total Bilirubin: 0.7 mg/dL (ref 0.3–1.2)
Total Protein: 7.7 g/dL (ref 6.5–8.1)

## 2020-02-15 LAB — CBC WITH DIFFERENTIAL/PLATELET
Abs Immature Granulocytes: 0.07 10*3/uL (ref 0.00–0.07)
Basophils Absolute: 0 10*3/uL (ref 0.0–0.1)
Basophils Relative: 1 %
Eosinophils Absolute: 0 10*3/uL (ref 0.0–0.5)
Eosinophils Relative: 1 %
HCT: 36.5 % (ref 36.0–46.0)
Hemoglobin: 11.6 g/dL — ABNORMAL LOW (ref 12.0–15.0)
Immature Granulocytes: 1 %
Lymphocytes Relative: 5 %
Lymphs Abs: 0.4 10*3/uL — ABNORMAL LOW (ref 0.7–4.0)
MCH: 28.6 pg (ref 26.0–34.0)
MCHC: 31.8 g/dL (ref 30.0–36.0)
MCV: 89.9 fL (ref 80.0–100.0)
Monocytes Absolute: 0.7 10*3/uL (ref 0.1–1.0)
Monocytes Relative: 8 %
Neutro Abs: 7.4 10*3/uL (ref 1.7–7.7)
Neutrophils Relative %: 84 %
Platelets: 315 10*3/uL (ref 150–400)
RBC: 4.06 MIL/uL (ref 3.87–5.11)
RDW: 16.2 % — ABNORMAL HIGH (ref 11.5–15.5)
WBC: 8.7 10*3/uL (ref 4.0–10.5)
nRBC: 0 % (ref 0.0–0.2)

## 2020-02-15 LAB — TSH: TSH: 0.106 u[IU]/mL — ABNORMAL LOW (ref 0.308–3.960)

## 2020-02-15 LAB — CEA (IN HOUSE-CHCC): CEA (CHCC-In House): 4.44 ng/mL (ref 0.00–5.00)

## 2020-02-15 MED ORDER — ALBUTEROL SULFATE HFA 108 (90 BASE) MCG/ACT IN AERS: 2.0000 | INHALATION_SPRAY | Freq: Four times a day (QID) | RESPIRATORY_TRACT | 6 refills | Status: AC | PRN

## 2020-02-15 MED ORDER — ALBUTEROL SULFATE HFA 108 (90 BASE) MCG/ACT IN AERS: 2.0000 | INHALATION_SPRAY | Freq: Four times a day (QID) | RESPIRATORY_TRACT | 3 refills | Status: AC

## 2020-02-15 MED ORDER — DEXAMETHASONE 4 MG PO TABS: 4.0000 mg | ORAL_TABLET | Freq: Every day | ORAL | 0 refills | Status: DC

## 2020-02-15 MED ORDER — DEXAMETHASONE 4 MG PO TABS: 4.0000 mg | ORAL_TABLET | Freq: Every day | ORAL | 0 refills | Status: AC

## 2020-02-15 MED FILL — PROMETHAZINE W/COD SYRUP: 6.25-10 | 18 days supply | Qty: 180 | Fill #0

## 2020-02-15 NOTE — Progress Notes (Signed)
[facility] Clinical Social Work  Progress Note  CSW provided final installment of Terri Estes today. Discussed other options for financial help from outside foundations. Emailed information for [employer] in [place], Patient Terri Estes. Patient is also working on application for disability (with assistance from [employer]).  CSW provided supportive counseling around patient's concern about dying, especially related to her children being young and worry about them not remembering her. Her 34yo has been having more outbursts lately as well. Terri Estes is receiving support emotionally from family and her pastor & pastor's wife.    CSW will look into financial resources and continue to provide emotional support.     Devaris Quirk, [place], [place] Worker [facility]

## 2020-02-16 ENCOUNTER — Inpatient Hospital Stay: Payer: Medicaid Other

## 2020-02-16 LAB — CANCER ANTIGEN 27.29: CA 27.29: 176.2 U/mL — ABNORMAL HIGH (ref 0.0–38.6)

## 2020-02-18 ENCOUNTER — Other Ambulatory Visit: Payer: Self-pay

## 2020-02-18 ENCOUNTER — Telehealth: Payer: Self-pay | Admitting: Oncology

## 2020-02-18 ENCOUNTER — Ambulatory Visit
Admission: RE | Admit: 2020-02-18 | Discharge: 2020-02-18 | Disposition: A | Discharge status: Home / Self Care | Payer: Medicaid Other | Source: Ambulatory Visit | Attending: Radiation Oncology | Admitting: Radiation Oncology

## 2020-02-18 DIAGNOSIS — C50811 Malignant neoplasm of overlapping sites of right female breast: Secondary | ICD-10-CM | POA: Diagnosis not present

## 2020-02-18 NOTE — Telephone Encounter (Signed)
I left a message regarding schedule  

## 2020-02-19 ENCOUNTER — Other Ambulatory Visit: Payer: Self-pay

## 2020-02-19 ENCOUNTER — Ambulatory Visit
Admission: RE | Admit: 2020-02-19 | Discharge: 2020-02-19 | Disposition: A | Discharge status: Home / Self Care | Payer: Medicaid Other | Source: Ambulatory Visit | Attending: Radiation Oncology | Admitting: Radiation Oncology

## 2020-02-19 DIAGNOSIS — C50811 Malignant neoplasm of overlapping sites of right female breast: Secondary | ICD-10-CM | POA: Diagnosis not present

## 2020-02-20 ENCOUNTER — Other Ambulatory Visit: Payer: Self-pay

## 2020-02-20 ENCOUNTER — Ambulatory Visit
Admission: RE | Admit: 2020-02-20 | Discharge: 2020-02-20 | Disposition: A | Discharge status: Home / Self Care | Payer: Medicaid Other | Source: Ambulatory Visit | Attending: Radiation Oncology | Admitting: Radiation Oncology

## 2020-02-20 DIAGNOSIS — C50811 Malignant neoplasm of overlapping sites of right female breast: Secondary | ICD-10-CM | POA: Diagnosis not present

## 2020-02-21 ENCOUNTER — Ambulatory Visit: Payer: Medicaid Other

## 2020-02-21 ENCOUNTER — Other Ambulatory Visit: Payer: Self-pay | Admitting: Oncology

## 2020-02-21 ENCOUNTER — Ambulatory Visit
Admission: RE | Admit: 2020-02-21 | Discharge: 2020-02-21 | Disposition: A | Discharge status: Home / Self Care | Payer: Medicaid Other | Source: Ambulatory Visit | Attending: Radiation Oncology | Admitting: Radiation Oncology

## 2020-02-21 ENCOUNTER — Inpatient Hospital Stay: Payer: Medicaid Other

## 2020-02-21 ENCOUNTER — Other Ambulatory Visit: Payer: Self-pay

## 2020-02-21 VITALS — BP 97/74 | HR 117 | Temp 98.0°F | Resp 22

## 2020-02-21 DIAGNOSIS — C50811 Malignant neoplasm of overlapping sites of right female breast: Secondary | ICD-10-CM | POA: Diagnosis not present

## 2020-02-21 DIAGNOSIS — Z5111 Encounter for antineoplastic chemotherapy: Secondary | ICD-10-CM | POA: Diagnosis not present

## 2020-02-21 DIAGNOSIS — Z171 Estrogen receptor negative status [ER-]: Secondary | ICD-10-CM

## 2020-02-21 MED ORDER — GOSERELIN ACETATE 3.6 MG ~~LOC~~ IMPL
3.6000 mg | DRUG_IMPLANT | Freq: Once | SUBCUTANEOUS | Status: AC
  Administered 2020-02-21: 3.6 mg via SUBCUTANEOUS
  Filled 2020-02-21: qty 3.6

## 2020-02-21 NOTE — Patient Instructions (Signed)

## 2020-02-21 NOTE — Progress Notes (Signed)
I saw Terri Estes briefly today just to make sure she was tolerating the capecitabine well.  She is having no problems from this.  Certainly she has had no diarrhea.  She has 1 mouth sore and she has not been taking her Valtrex.  I suggested she go back to that.  She does feel the dexamethasone is helping her appetite and the pain is a little bit better as a result.  I already on scheduled to see her a week after she finishes radiation.  I said she could bring her [name] or her mother

## 2020-02-22 ENCOUNTER — Other Ambulatory Visit: Payer: Self-pay

## 2020-02-22 ENCOUNTER — Ambulatory Visit
Admission: RE | Admit: 2020-02-22 | Discharge: 2020-02-22 | Disposition: A | Discharge status: Home / Self Care | Payer: Medicaid Other | Source: Ambulatory Visit | Attending: Radiation Oncology | Admitting: Radiation Oncology

## 2020-02-22 ENCOUNTER — Ambulatory Visit: Payer: Medicaid Other | Admitting: Radiation Oncology

## 2020-02-22 ENCOUNTER — Other Ambulatory Visit: Payer: Self-pay | Admitting: Radiation Oncology

## 2020-02-22 DIAGNOSIS — C50811 Malignant neoplasm of overlapping sites of right female breast: Secondary | ICD-10-CM | POA: Diagnosis not present

## 2020-02-22 MED ORDER — OXYCODONE HCL 5 MG PO TABS: 5.0000 mg | ORAL_TABLET | Freq: Four times a day (QID) | ORAL | 0 refills | Status: DC | PRN

## 2020-02-22 MED FILL — oxyCODONE HCL 5 MG TABS: 5 | 7 days supply | Qty: 30 | Fill #0

## 2020-02-25 ENCOUNTER — Ambulatory Visit: Payer: Medicaid Other

## 2020-02-26 ENCOUNTER — Ambulatory Visit
Admission: RE | Admit: 2020-02-26 | Discharge: 2020-02-26 | Disposition: A | Discharge status: Home / Self Care | Payer: Medicaid Other | Source: Ambulatory Visit | Attending: Radiation Oncology | Admitting: Radiation Oncology

## 2020-02-26 ENCOUNTER — Other Ambulatory Visit: Payer: Self-pay

## 2020-02-26 DIAGNOSIS — Z171 Estrogen receptor negative status [ER-]: Secondary | ICD-10-CM | POA: Diagnosis present

## 2020-02-26 DIAGNOSIS — C50811 Malignant neoplasm of overlapping sites of right female breast: Secondary | ICD-10-CM | POA: Insufficient documentation

## 2020-02-27 ENCOUNTER — Ambulatory Visit
Admission: RE | Admit: 2020-02-27 | Discharge: 2020-02-27 | Disposition: A | Discharge status: Home / Self Care | Payer: Medicaid Other | Source: Ambulatory Visit | Attending: Radiation Oncology | Admitting: Radiation Oncology

## 2020-02-27 DIAGNOSIS — C50811 Malignant neoplasm of overlapping sites of right female breast: Secondary | ICD-10-CM | POA: Diagnosis not present

## 2020-02-28 ENCOUNTER — Ambulatory Visit
Admission: RE | Admit: 2020-02-28 | Discharge: 2020-02-28 | Disposition: A | Discharge status: Home / Self Care | Payer: Medicaid Other | Source: Ambulatory Visit | Attending: Radiation Oncology | Admitting: Radiation Oncology

## 2020-02-28 ENCOUNTER — Other Ambulatory Visit: Payer: Self-pay

## 2020-02-28 DIAGNOSIS — C50811 Malignant neoplasm of overlapping sites of right female breast: Secondary | ICD-10-CM | POA: Diagnosis not present

## 2020-02-28 NOTE — Progress Notes (Signed)
  Radiation Oncology         [phone] ________________________________  Name: Terri Estes MRN: [id number]  Date: 11/16/2019  DOB: 1986-10-08  Optical Surface Tracking Plan:  Since intensity modulated radiotherapy (IMRT) and 3D conformal radiation treatment methods are predicated on accurate and precise positioning for treatment, intrafraction motion monitoring is medically necessary to ensure accurate and safe treatment delivery.  The ability to quantify intrafraction motion without excessive ionizing radiation dose can only be performed with optical surface tracking. Accordingly, surface imaging offers the opportunity to obtain 3D measurements of patient position throughout IMRT and 3D treatments without excessive radiation exposure.  I am ordering optical surface tracking for this patient's upcoming course of radiotherapy. ________________________________  Kyung Rudd, MD 02/28/2020 2:02 PM    Reference:   Ursula Alert, J, et al. Surface imaging-based analysis of intrafraction motion for breast radiotherapy patients.Journal of [place], n. 6, nov. 2014. ISSN [id number].   Available at: <http://www.jacmp.org/index.php/jacmp/article/view/4957>.

## 2020-02-28 NOTE — Progress Notes (Signed)
  Radiation Oncology         [phone] ________________________________  Name: Terri Estes MRN: [id number]  Date: 11/16/2019  DOB: 12-10-1986  DIAGNOSIS:     ICD-10-CM   1. Malignant neoplasm of overlapping sites of right breast in female, estrogen receptor negative ([place])  C50.811    Z17.1      SIMULATION AND TREATMENT PLANNING NOTE  The patient presented for simulation prior to beginning her course of radiation treatment for her diagnosis of right-sided breast cancer. The patient was placed in a supine position on a breast board. A customized vac-lock bag was also constructed and this complex treatment device will be used on a daily basis during her treatment. In this fashion, a CT scan was obtained through the chest area and an isocenter was placed near the chest wall at the upper aspect of the right chest.  The patient will be planned to receive a course of radiation initially to a dose of 50.4 gray. This will consist of a 4 field technique targeting the right chest wall as well as the supraclavicular region. Therefore 2 customized medial and lateral tangent fields have been created targeting the chest wall, and also 2 additional customized fields have been designed to treat the supraclavicular region both with a right supraclavicular field and a right posterior axillary boost field. A forward planning/reduced field technique will also be evaluated to determine if this significantly improves the dose homogeneity of the overall plan. Therefore, additional customized blocks/fields may be necessary.  This initial treatment will be accomplished at 1.8 gray per fraction.   The initial plan will consist of a 3-D conformal technique. The target volume/scar, heart and lungs have been contoured and dose volume histograms of each of these structures will be evaluated as part of the 3-D conformal treatment planning process.   It is anticipated that the patient will then receive a 10 gray  boost to the surgical scar. This will be accomplished at 2 gray per fraction. The final anticipated total dose therefore will correspond to 60.4 gray.   Special treatment procedure The patient will also receive concurrent chemotherapy during the treatment. The patient may therefore experience increased toxicity or side effects and the patient will be monitored for such problems. This may require extra lab work as necessary. This therefore constitutes a special treatment procedure.   _______________________________   Jodelle Gross, MD, PhD

## 2020-02-29 ENCOUNTER — Telehealth: Payer: Self-pay | Admitting: Licensed Clinical Social Worker

## 2020-02-29 ENCOUNTER — Other Ambulatory Visit: Payer: Self-pay

## 2020-02-29 ENCOUNTER — Other Ambulatory Visit: Payer: Self-pay | Admitting: Radiation Oncology

## 2020-02-29 ENCOUNTER — Ambulatory Visit: Payer: Medicaid Other | Admitting: Radiation Oncology

## 2020-02-29 ENCOUNTER — Ambulatory Visit
Admission: RE | Admit: 2020-02-29 | Discharge: 2020-02-29 | Disposition: A | Discharge status: Home / Self Care | Payer: Medicaid Other | Source: Ambulatory Visit | Attending: Radiation Oncology | Admitting: Radiation Oncology

## 2020-02-29 DIAGNOSIS — C50811 Malignant neoplasm of overlapping sites of right female breast: Secondary | ICD-10-CM | POA: Diagnosis not present

## 2020-02-29 MED ORDER — OXYCODONE HCL 5 MG PO TABS: 5.0000 mg | ORAL_TABLET | Freq: Four times a day (QID) | ORAL | 0 refills | Status: AC | PRN

## 2020-02-29 MED FILL — oxyCODONE HCL 5 MG TABS: 5 | 10 days supply | Qty: 40 | Fill #0

## 2020-02-29 NOTE — Telephone Encounter (Signed)
Attempted TC to patient to follow-up on applications for foundations, check disability status, and provide emotional support. No answer. Left generic VM asking patient to call back.

## 2020-02-29 NOTE — Progress Notes (Signed)
  Radiation Oncology         [phone] ________________________________  Name: CHANCE MATHIEU MRN: [id number]  Date: 01/07/2020  DOB: 1986-02-22  DIAGNOSIS:     ICD-10-CM   1. Malignant neoplasm of overlapping sites of right breast in female, estrogen receptor negative ([place])  C50.811    Z17.1   2. Breast cancer, stage 2, right ([place])  C50.911      SIMULATION AND TREATMENT PLANNING NOTE  The patient presented for simulation prior to beginning her course of radiation treatment for her diagnosis of right-sided breast cancer. The patient was placed in a supine position on a breast board. A customized vac-lock bag was also constructed and this complex treatment device will be used on a daily basis during her treatment. In this fashion, a CT scan was obtained through the chest area and an isocenter was placed near the chest wall at the upper aspect of the right chest.  The patient will be planned to receive a course of radiation initially to a dose of 50.4 gray. This will consist of a 4 field technique targeting the right chest wall as well as the supraclavicular region. Therefore 2 customized medial and lateral tangent fields have been created targeting the chest wall, and also 2 additional customized fields have been designed to treat the supraclavicular region both with a right supraclavicular field and a right posterior axillary boost field. A forward planning/reduced field technique will also be evaluated to determine if this significantly improves the dose homogeneity of the overall plan. Therefore, additional customized blocks/fields may be necessary.  This initial treatment will be accomplished at 1.8 gray per fraction.   The initial plan will consist of a 3-D conformal technique. The target volume/scar, heart and lungs have been contoured and dose volume histograms of each of these structures will be evaluated as part of the 3-D conformal treatment planning process.   It is anticipated  that the patient will then receive a 10 gray boost to the surgical scar. This will be accomplished at 2 gray per fraction. The final anticipated total dose therefore will correspond to 60.4 gray.    _______________________________   Jodelle Gross, MD, PhD

## 2020-02-29 NOTE — Progress Notes (Signed)
  Radiation Oncology         [phone] ________________________________  Name: Terri Estes MRN: [id number]  Date: 01/07/2020  DOB: [birth year]  Optical Surface Tracking Plan:  Since intensity modulated radiotherapy (IMRT) and 3D conformal radiation treatment methods are predicated on accurate and precise positioning for treatment, intrafraction motion monitoring is medically necessary to ensure accurate and safe treatment delivery.  The ability to quantify intrafraction motion without excessive ionizing radiation dose can only be performed with optical surface tracking. Accordingly, surface imaging offers the opportunity to obtain 3D measurements of patient position throughout IMRT and 3D treatments without excessive radiation exposure.  I am ordering optical surface tracking for this patient's upcoming course of radiotherapy. ________________________________  Kyung Rudd, MD 02/29/2020 1:12 PM    Reference:   Ursula Alert, J, et al. Surface imaging-based analysis of intrafraction motion for breast radiotherapy patients.Journal of [place], n. 6, nov. 2014. ISSN [id number].   Available at: <http://www.jacmp.org/index.php/jacmp/article/view/[email or web address]>.

## 2020-03-03 ENCOUNTER — Other Ambulatory Visit: Payer: Self-pay | Admitting: Radiation Oncology

## 2020-03-03 ENCOUNTER — Ambulatory Visit: Payer: Medicaid Other

## 2020-03-03 ENCOUNTER — Other Ambulatory Visit: Payer: Self-pay

## 2020-03-03 ENCOUNTER — Encounter: Payer: Self-pay | Admitting: *Deleted

## 2020-03-03 ENCOUNTER — Telehealth: Payer: Self-pay | Admitting: *Deleted

## 2020-03-03 ENCOUNTER — Ambulatory Visit
Admission: RE | Admit: 2020-03-03 | Discharge: 2020-03-03 | Disposition: A | Discharge status: Home / Self Care | Payer: Medicaid Other | Source: Ambulatory Visit | Attending: Radiation Oncology | Admitting: Radiation Oncology

## 2020-03-03 ENCOUNTER — Encounter: Payer: Self-pay | Admitting: Oncology

## 2020-03-03 DIAGNOSIS — C50811 Malignant neoplasm of overlapping sites of right female breast: Secondary | ICD-10-CM | POA: Diagnosis not present

## 2020-03-04 ENCOUNTER — Ambulatory Visit (HOSPITAL_BASED_OUTPATIENT_CLINIC_OR_DEPARTMENT_OTHER): Payer: Medicaid Other | Admitting: Oncology

## 2020-03-04 ENCOUNTER — Other Ambulatory Visit: Payer: Self-pay | Admitting: *Deleted

## 2020-03-04 ENCOUNTER — Emergency Department (HOSPITAL_COMMUNITY): Payer: Medicaid Other

## 2020-03-04 ENCOUNTER — Telehealth: Payer: Self-pay | Admitting: *Deleted

## 2020-03-04 ENCOUNTER — Inpatient Hospital Stay: Payer: Medicaid Other | Attending: Oncology

## 2020-03-04 ENCOUNTER — Other Ambulatory Visit: Payer: Self-pay

## 2020-03-04 ENCOUNTER — Ambulatory Visit
Admission: RE | Admit: 2020-03-04 | Discharge: 2020-03-04 | Disposition: A | Discharge status: Home / Self Care | Payer: Medicaid Other | Source: Ambulatory Visit | Attending: Radiation Oncology | Admitting: Radiation Oncology

## 2020-03-04 ENCOUNTER — Ambulatory Visit: Payer: Medicaid Other

## 2020-03-04 ENCOUNTER — Ambulatory Visit (HOSPITAL_COMMUNITY)
Admission: RE | Admit: 2020-03-04 | Discharge: 2020-03-04 | Disposition: A | Discharge status: Home / Self Care | Payer: Medicaid Other | Source: Ambulatory Visit | Attending: Oncology | Admitting: Oncology

## 2020-03-04 ENCOUNTER — Encounter (HOSPITAL_COMMUNITY): Payer: Self-pay | Admitting: Emergency Medicine

## 2020-03-04 ENCOUNTER — Inpatient Hospital Stay (HOSPITAL_COMMUNITY)
Admission: EM | Admit: 2020-03-04 | Discharge: 2020-03-06 | DRG: 597 | Disposition: A | Discharge status: Home / Self Care | Payer: Medicaid Other | Attending: Internal Medicine | Admitting: Internal Medicine

## 2020-03-04 DIAGNOSIS — Z803 Family history of malignant neoplasm of breast: Secondary | ICD-10-CM

## 2020-03-04 DIAGNOSIS — Z171 Estrogen receptor negative status [ER-]: Secondary | ICD-10-CM

## 2020-03-04 DIAGNOSIS — C7800 Secondary malignant neoplasm of unspecified lung: Secondary | ICD-10-CM

## 2020-03-04 DIAGNOSIS — Z9011 Acquired absence of right breast and nipple: Secondary | ICD-10-CM | POA: Insufficient documentation

## 2020-03-04 DIAGNOSIS — C50811 Malignant neoplasm of overlapping sites of right female breast: Secondary | ICD-10-CM

## 2020-03-04 DIAGNOSIS — C773 Secondary and unspecified malignant neoplasm of axilla and upper limb lymph nodes: Secondary | ICD-10-CM | POA: Diagnosis present

## 2020-03-04 DIAGNOSIS — D630 Anemia in neoplastic disease: Secondary | ICD-10-CM | POA: Diagnosis present

## 2020-03-04 DIAGNOSIS — Z7952 Long term (current) use of systemic steroids: Secondary | ICD-10-CM

## 2020-03-04 DIAGNOSIS — T451X5A Adverse effect of antineoplastic and immunosuppressive drugs, initial encounter: Secondary | ICD-10-CM

## 2020-03-04 DIAGNOSIS — J9601 Acute respiratory failure with hypoxia: Secondary | ICD-10-CM | POA: Diagnosis present

## 2020-03-04 DIAGNOSIS — L409 Psoriasis, unspecified: Secondary | ICD-10-CM | POA: Diagnosis present

## 2020-03-04 DIAGNOSIS — F17210 Nicotine dependence, cigarettes, uncomplicated: Secondary | ICD-10-CM | POA: Diagnosis present

## 2020-03-04 DIAGNOSIS — G629 Polyneuropathy, unspecified: Secondary | ICD-10-CM | POA: Insufficient documentation

## 2020-03-04 DIAGNOSIS — R000 Tachycardia, unspecified: Secondary | ICD-10-CM

## 2020-03-04 DIAGNOSIS — C787 Secondary malignant neoplasm of liver and intrahepatic bile duct: Secondary | ICD-10-CM | POA: Insufficient documentation

## 2020-03-04 DIAGNOSIS — R0682 Tachypnea, not elsewhere classified: Secondary | ICD-10-CM

## 2020-03-04 DIAGNOSIS — J90 Pleural effusion, not elsewhere classified: Secondary | ICD-10-CM | POA: Diagnosis not present

## 2020-03-04 DIAGNOSIS — R590 Localized enlarged lymph nodes: Secondary | ICD-10-CM | POA: Insufficient documentation

## 2020-03-04 DIAGNOSIS — F419 Anxiety disorder, unspecified: Secondary | ICD-10-CM | POA: Diagnosis present

## 2020-03-04 DIAGNOSIS — C50211 Malignant neoplasm of upper-inner quadrant of right female breast: Secondary | ICD-10-CM | POA: Insufficient documentation

## 2020-03-04 DIAGNOSIS — R0602 Shortness of breath: Secondary | ICD-10-CM | POA: Diagnosis present

## 2020-03-04 DIAGNOSIS — I700 Atherosclerosis of aorta: Secondary | ICD-10-CM | POA: Insufficient documentation

## 2020-03-04 DIAGNOSIS — Z841 Family history of disorders of kidney and ureter: Secondary | ICD-10-CM | POA: Insufficient documentation

## 2020-03-04 DIAGNOSIS — Z800 Family history of malignant neoplasm of digestive organs: Secondary | ICD-10-CM | POA: Insufficient documentation

## 2020-03-04 DIAGNOSIS — L408 Other psoriasis: Secondary | ICD-10-CM

## 2020-03-04 DIAGNOSIS — F329 Major depressive disorder, single episode, unspecified: Secondary | ICD-10-CM | POA: Diagnosis present

## 2020-03-04 DIAGNOSIS — F1290 Cannabis use, unspecified, uncomplicated: Secondary | ICD-10-CM | POA: Insufficient documentation

## 2020-03-04 DIAGNOSIS — G620 Drug-induced polyneuropathy: Secondary | ICD-10-CM | POA: Diagnosis not present

## 2020-03-04 DIAGNOSIS — J45909 Unspecified asthma, uncomplicated: Secondary | ICD-10-CM | POA: Diagnosis present

## 2020-03-04 DIAGNOSIS — G8929 Other chronic pain: Secondary | ICD-10-CM | POA: Diagnosis present

## 2020-03-04 DIAGNOSIS — J910 Malignant pleural effusion: Secondary | ICD-10-CM | POA: Insufficient documentation

## 2020-03-04 DIAGNOSIS — C7802 Secondary malignant neoplasm of left lung: Secondary | ICD-10-CM | POA: Insufficient documentation

## 2020-03-04 DIAGNOSIS — Z79899 Other long term (current) drug therapy: Secondary | ICD-10-CM | POA: Diagnosis not present

## 2020-03-04 DIAGNOSIS — K219 Gastro-esophageal reflux disease without esophagitis: Secondary | ICD-10-CM | POA: Diagnosis present

## 2020-03-04 DIAGNOSIS — Z808 Family history of malignant neoplasm of other organs or systems: Secondary | ICD-10-CM | POA: Insufficient documentation

## 2020-03-04 DIAGNOSIS — C7801 Secondary malignant neoplasm of right lung: Secondary | ICD-10-CM | POA: Insufficient documentation

## 2020-03-04 DIAGNOSIS — I2699 Other pulmonary embolism without acute cor pulmonale: Secondary | ICD-10-CM | POA: Diagnosis present

## 2020-03-04 DIAGNOSIS — Z833 Family history of diabetes mellitus: Secondary | ICD-10-CM | POA: Insufficient documentation

## 2020-03-04 DIAGNOSIS — Z20822 Contact with and (suspected) exposure to covid-19: Secondary | ICD-10-CM | POA: Diagnosis present

## 2020-03-04 DIAGNOSIS — J9600 Acute respiratory failure, unspecified whether with hypoxia or hypercapnia: Secondary | ICD-10-CM | POA: Diagnosis present

## 2020-03-04 DIAGNOSIS — C50911 Malignant neoplasm of unspecified site of right female breast: Principal | ICD-10-CM | POA: Diagnosis present

## 2020-03-04 DIAGNOSIS — Z9889 Other specified postprocedural states: Secondary | ICD-10-CM

## 2020-03-04 DIAGNOSIS — Z7901 Long term (current) use of anticoagulants: Secondary | ICD-10-CM | POA: Insufficient documentation

## 2020-03-04 DIAGNOSIS — Z560 Unemployment, unspecified: Secondary | ICD-10-CM | POA: Insufficient documentation

## 2020-03-04 DIAGNOSIS — Z8759 Personal history of other complications of pregnancy, childbirth and the puerperium: Secondary | ICD-10-CM | POA: Insufficient documentation

## 2020-03-04 DIAGNOSIS — Z820 Family history of epilepsy and other diseases of the nervous system: Secondary | ICD-10-CM | POA: Insufficient documentation

## 2020-03-04 LAB — CBC WITH DIFFERENTIAL/PLATELET
Abs Immature Granulocytes: 0.17 10*3/uL — ABNORMAL HIGH (ref 0.00–0.07)
Basophils Absolute: 0 10*3/uL (ref 0.0–0.1)
Basophils Relative: 0 %
Eosinophils Absolute: 0 10*3/uL (ref 0.0–0.5)
Eosinophils Relative: 0 %
HCT: 35.6 % — ABNORMAL LOW (ref 36.0–46.0)
Hemoglobin: 11.3 g/dL — ABNORMAL LOW (ref 12.0–15.0)
Immature Granulocytes: 1 %
Lymphocytes Relative: 2 %
Lymphs Abs: 0.3 10*3/uL — ABNORMAL LOW (ref 0.7–4.0)
MCH: 29.8 pg (ref 26.0–34.0)
MCHC: 31.7 g/dL (ref 30.0–36.0)
MCV: 93.9 fL (ref 80.0–100.0)
Monocytes Absolute: 0.7 10*3/uL (ref 0.1–1.0)
Monocytes Relative: 5 %
Neutro Abs: 12.7 10*3/uL — ABNORMAL HIGH (ref 1.7–7.7)
Neutrophils Relative %: 92 %
Platelets: 154 10*3/uL (ref 150–400)
RBC: 3.79 MIL/uL — ABNORMAL LOW (ref 3.87–5.11)
RDW: 18.6 % — ABNORMAL HIGH (ref 11.5–15.5)
WBC: 13.8 10*3/uL — ABNORMAL HIGH (ref 4.0–10.5)
nRBC: 0 % (ref 0.0–0.2)

## 2020-03-04 LAB — COMPREHENSIVE METABOLIC PANEL
ALT: 62 U/L — ABNORMAL HIGH (ref 0–44)
AST: 39 U/L (ref 15–41)
Albumin: 2.7 g/dL — ABNORMAL LOW (ref 3.5–5.0)
Alkaline Phosphatase: 188 U/L — ABNORMAL HIGH (ref 38–126)
Anion gap: 12 (ref 5–15)
BUN: 11 mg/dL (ref 6–20)
CO2: 26 mmol/L (ref 22–32)
Calcium: 9.3 mg/dL (ref 8.9–10.3)
Chloride: 101 mmol/L (ref 98–111)
Creatinine, Ser: 0.69 mg/dL (ref 0.44–1.00)
GFR calc Af Amer: >60 mL/min (ref >60)
GFR calc non Af Amer: >60 mL/min (ref >60)
Glucose, Bld: 116 mg/dL — ABNORMAL HIGH (ref 70–99)
Potassium: 3.9 mmol/L (ref 3.5–5.1)
Sodium: 139 mmol/L (ref 135–145)
Total Bilirubin: 0.5 mg/dL (ref 0.3–1.2)
Total Protein: 7.1 g/dL (ref 6.5–8.1)

## 2020-03-04 LAB — I-STAT BETA HCG BLOOD, ED (MC, WL, AP ONLY): I-stat hCG, quantitative: 6.1 m[IU]/mL — ABNORMAL HIGH (ref <5)

## 2020-03-04 LAB — RESPIRATORY PANEL BY RT PCR (FLU A&B, COVID)
Influenza A by PCR: NEGATIVE
Influenza B by PCR: NEGATIVE
SARS Coronavirus 2 by RT PCR: NEGATIVE

## 2020-03-04 LAB — TSH: TSH: 0.138 u[IU]/mL — ABNORMAL LOW (ref 0.308–3.960)

## 2020-03-04 LAB — PROTIME-INR
INR: 1.2 (ref 0.8–1.2)
Prothrombin Time: 14.8 seconds (ref 11.4–15.2)

## 2020-03-04 LAB — APTT: aPTT: 25 seconds (ref 24–36)

## 2020-03-04 LAB — LIPASE, BLOOD: Lipase: 15 U/L (ref 11–51)

## 2020-03-04 MED ORDER — ALUM & MAG HYDROXIDE-SIMETH 200-200-20 MG/5ML PO SUSP
15.0000 mL | Freq: Four times a day (QID) | ORAL | Status: DC | PRN
  Administered 2020-03-04: 15 mL via ORAL
  Filled 2020-03-04: qty 30

## 2020-03-04 MED ORDER — ENOXAPARIN SODIUM 60 MG/0.6ML ~~LOC~~ SOLN
60.0000 mg | Freq: Once | SUBCUTANEOUS | Status: DC
  Filled 2020-03-04: qty 0.6

## 2020-03-04 MED ORDER — IOHEXOL 350 MG/ML SOLN
80.0000 mL | Freq: Once | INTRAVENOUS | Status: AC | PRN
  Administered 2020-03-04: 80 mL via INTRAVENOUS

## 2020-03-04 MED ORDER — ALBUTEROL SULFATE HFA 108 (90 BASE) MCG/ACT IN AERS: 2.0000 | INHALATION_SPRAY | Freq: Four times a day (QID) | RESPIRATORY_TRACT | Status: DC | PRN

## 2020-03-04 MED ORDER — OXYCODONE HCL 5 MG PO TABS
5.0000 mg | ORAL_TABLET | Freq: Four times a day (QID) | ORAL | Status: DC | PRN
  Administered 2020-03-04 – 2020-03-06 (×5): 5 mg via ORAL
  Filled 2020-03-04 (×5): qty 1

## 2020-03-04 MED ORDER — CYCLOBENZAPRINE HCL 10 MG PO TABS: 10.0000 mg | ORAL_TABLET | Freq: Three times a day (TID) | ORAL | Status: DC | PRN

## 2020-03-04 MED ORDER — HEPARIN (PORCINE) 25000 UT/250ML-% IV SOLN
1200.0000 [IU]/h | INTRAVENOUS | Status: DC
  Administered 2020-03-04: 1200 [IU]/h via INTRAVENOUS
  Filled 2020-03-04: qty 250

## 2020-03-04 MED ORDER — SODIUM CHLORIDE (PF) 0.9 % IJ SOLN
INTRAMUSCULAR | Status: AC
  Filled 2020-03-04: qty 50

## 2020-03-04 MED ORDER — FAMOTIDINE 20 MG PO TABS
20.0000 mg | ORAL_TABLET | Freq: Every day | ORAL | Status: DC
  Administered 2020-03-04 – 2020-03-06 (×3): 20 mg via ORAL
  Filled 2020-03-04 (×3): qty 1

## 2020-03-04 MED ORDER — LORAZEPAM 2 MG/ML IJ SOLN
1.0000 mg | Freq: Once | INTRAMUSCULAR | Status: AC
  Administered 2020-03-04: 1 mg via INTRAVENOUS
  Filled 2020-03-04: qty 1

## 2020-03-04 MED ORDER — ONDANSETRON HCL 4 MG PO TABS: 4.0000 mg | ORAL_TABLET | Freq: Four times a day (QID) | ORAL | Status: DC | PRN

## 2020-03-04 MED ORDER — DEXAMETHASONE 4 MG PO TABS
4.0000 mg | ORAL_TABLET | Freq: Every day | ORAL | Status: DC
  Administered 2020-03-05 – 2020-03-06 (×2): 4 mg via ORAL
  Filled 2020-03-04 (×2): qty 1

## 2020-03-04 MED ORDER — ONDANSETRON HCL 4 MG/2ML IJ SOLN: 4.0000 mg | Freq: Four times a day (QID) | INTRAMUSCULAR | Status: DC | PRN

## 2020-03-04 MED ORDER — ACETAMINOPHEN 325 MG PO TABS: 650.0000 mg | ORAL_TABLET | Freq: Four times a day (QID) | ORAL | Status: DC | PRN

## 2020-03-04 MED ORDER — GABAPENTIN 300 MG PO CAPS
300.0000 mg | ORAL_CAPSULE | Freq: Three times a day (TID) | ORAL | Status: DC
  Administered 2020-03-04 – 2020-03-06 (×6): 300 mg via ORAL
  Filled 2020-03-04 (×6): qty 1

## 2020-03-04 MED ORDER — ALTEPLASE 2 MG IJ SOLR
2.0000 mg | Freq: Once | INTRAMUSCULAR | Status: AC
  Administered 2020-03-04: 2 mg
  Filled 2020-03-04: qty 2

## 2020-03-04 MED ORDER — ACETAMINOPHEN 650 MG RE SUPP: 650.0000 mg | Freq: Four times a day (QID) | RECTAL | Status: DC | PRN

## 2020-03-04 MED ORDER — BUPRENORPHINE HCL 8 MG SL SUBL
8.0000 mg | SUBLINGUAL_TABLET | Freq: Two times a day (BID) | SUBLINGUAL | Status: DC
  Administered 2020-03-04 – 2020-03-06 (×4): 8 mg via SUBLINGUAL
  Filled 2020-03-04 (×4): qty 1

## 2020-03-04 MED ORDER — HEPARIN BOLUS VIA INFUSION
3000.0000 [IU] | Freq: Once | INTRAVENOUS | Status: AC
  Administered 2020-03-04: 3000 [IU] via INTRAVENOUS
  Filled 2020-03-04: qty 3000

## 2020-03-04 MED ORDER — ALBUTEROL SULFATE (2.5 MG/3ML) 0.083% IN NEBU
2.5000 mg | INHALATION_SOLUTION | Freq: Four times a day (QID) | RESPIRATORY_TRACT | Status: DC | PRN
  Administered 2020-03-04: 2.5 mg via RESPIRATORY_TRACT
  Filled 2020-03-04: qty 3

## 2020-03-04 NOTE — Progress Notes (Signed)
ANTICOAGULATION CONSULT NOTE - Initial Consult  Pharmacy Consult for heparin Indication: pulmonary embolus  No Known Allergies  Patient Measurements: Height: 5\' 2"  (157.5 cm) Weight: 140 lb (63.5 kg) IBW/kg (Calculated) : 50.1 Heparin Dosing Weight: 63.5kg  Vital Signs: Temp: 98.7 F (37.1 C) (03/09 1551) Temp Source: Oral (03/09 1551) BP: 118/95 (03/09 1900) Pulse Rate: 114 (03/09 1845)  Labs: Recent Labs    03/04/20 1405  HGB 11.3*  HCT 35.6*  PLT 154  CREATININE 0.69    Estimated Creatinine Clearance: 89.3 mL/min (by C-G formula based on SCr of 0.69 mg/dL).   Medical History: Past Medical History:  Diagnosis Date  . Anemia    after pregnancy  . Anxiety    severe not taking meds  . Asthma    inhaler  4x per day  . Cancer ([place])   . Chronic pain   . Depression   . Family history of breast cancer   . GERD (gastroesophageal reflux disease)   . History of substance abuse ([place])   . Pregnancy induced hypertension    takes medication now.  . Psoriasis    all over body  . Trichomonas infection   . Vaginal Pap smear, abnormal       Assessment: 34 yo F with a significant past medical history of breast cancer comes in with a chief complaint of shortness of breath.  Was seen in the office today and found to be significantly tachypneic with a almost complete whiteout of the right lung.  CTA does show a tiny left PE. Pharmacy to dose heparin, no prior AC noted.  03/04/2020 H/H slightly low Plts WNL  Goal of Therapy:  Heparin level 0.3-0.7 units/ml Monitor platelets by anticoagulation protocol:   Plan:  APTT and pt/INR ordered STAT Heparin bolus 3000 units x 1 then start Heparin drip 1200 units/hr Heparin level in 6 hours Daily CBC  Dolly Rias RPh 03/04/2020, 7:37 PM

## 2020-03-04 NOTE — Telephone Encounter (Signed)
See other entries for today

## 2020-03-04 NOTE — H&P (Signed)
History and Physical    Terri Estes [id number] DOB: 06-06-1986 DOA: 03/04/2020  PCP: Patient, No Pcp Per  Patient coming from: Home.  Chief Complaint: Shortness of breath.  HPI: Terri Estes is a 34 y.o. female with history of metastatic breast cancer who had come in for radiation therapy when patient became short of breath and tachypneic was referred to the ER.  Patient states she had thoracentesis done almost about a month ago and fluid was taken but despite which patient was still short of breath.  Has some pleuritic cough denies any fever chills.  Chest pain mostly pleuritic in nature.  ED Course: In the ER patient had CT angiogram of the chest which showed worsening right-sided pleural effusion with small left-sided pulmonary embolism for which patient was started on heparin infusion.  Labs reveal WBC of 30.8 hemoglobin [id number] complete metabolic panel shows albumin of 2.7 EKG shows sinus tachycardia Covid test was negative.  Patient was started on heparin infusion admitted for further management.  Review of Systems: As per HPI, rest all negative.   Past Medical History:  Diagnosis Date  . Anemia    after pregnancy  . Anxiety    severe not taking meds  . Asthma    inhaler  4x per day  . Cancer ([place])   . Chronic pain   . Depression   . Family history of breast cancer   . GERD (gastroesophageal reflux disease)   . History of substance abuse ([place])   . Pregnancy induced hypertension    takes medication now.  . Psoriasis    all over body  . Trichomonas infection   . Vaginal Pap smear, abnormal     Past Surgical History:  Procedure Laterality Date  . CESAREAN SECTION  05/26/2008  . CESAREAN SECTION N/A 07/10/2017   Procedure: CESAREAN SECTION;  Surgeon: Florian Buff, MD;  Location: [facility];  Service: Obstetrics;  Laterality: N/A;  . CESAREAN SECTION N/A 09/23/2018   Procedure: CESAREAN SECTION;  Surgeon: Donnamae Jude, MD;  Location: [facility];  Service: Obstetrics;  Laterality: N/A;  . COLPOSCOPY    . MASTECTOMY WITH AXILLARY LYMPH NODE DISSECTION Right 10/16/2019   Procedure: RIGHT MASTECTOMY WITH AXILLARY LYMPH NODE DISSECTION;  Surgeon: Alphonsa Overall, MD;  Location: [facility];  Service: General;  Laterality: Right;  . PORTACATH PLACEMENT Left 04/30/2019   Procedure: INSERTION PORT-A-CATH WITH ULTRASOUND;  Surgeon: Alphonsa Overall, MD;  Location: [facility];  Service: General;  Laterality: Left;  Left Subclavian vein  . WISDOM TOOTH EXTRACTION       reports that she has been smoking cigarettes. She has been smoking about 0.10 packs per day for the past 0.00 years. She has never used smokeless tobacco. She reports current drug use. Frequency: 7.00 times per week. Drug: Marijuana. She reports that she does not drink alcohol.  No Known Allergies  Family History  Problem Relation Age of Onset  . Kidney disease Maternal Grandmother   . Breast cancer Maternal Grandmother 28       metastatic to liver; d. 96  . Kidney disease Maternal Grandfather   . Kidney disease Paternal Grandmother   . Diabetes Paternal Grandmother   . Breast cancer Paternal Grandmother   . Kidney disease Paternal Grandfather   . Breast cancer Paternal Aunt   . Cerebral palsy Child   . Breast cancer Paternal Great-grandmother   . Pancreatic cancer Other        PGMs brother   .  Throat cancer Paternal Uncle   . Breast cancer Other        PGMs sister    Prior to Admission medications   Medication Sig Start Date End Date Taking? Authorizing Provider  albuterol (VENTOLIN HFA) 108 (90 Base) MCG/ACT inhaler Inhale 2 puffs into the lungs every 6 (six) hours as needed for wheezing or shortness of breath. 02/15/20  Yes Magrinat, [name], MD  buprenorphine (SUBUTEX) 8 MG SUBL SL tablet Place 8 mg under the tongue 2 (two) times daily.    Yes [provider]  capecitabine (XELODA) 500 MG tablet Take 2 tablets (1,000 mg total) by mouth 2 (two) times daily after a  meal. Take Monday-Friday. Take only on days of radiation. 02/08/20  Yes Magrinat, [name], MD  cholestyramine (QUESTRAN) 4 g packet Take 1 packet (4 g total) by mouth 3 (three) times daily with meals. Patient taking differently: Take 4 g by mouth 3 (three) times daily with meals as needed (diarrhea).  05/11/19  Yes Magrinat, [name], MD  cyclobenzaprine (FLEXERIL) 10 MG tablet Take 1 tablet (10 mg total) by mouth 3 (three) times daily as needed for muscle spasms. 09/26/18  Yes Donnamae Jude, MD  dexamethasone (DECADRON) 4 MG tablet Take 1 tablet (4 mg total) by mouth daily with breakfast. 02/15/20  Yes Magrinat, [name], MD  Famotidine (PEPCID PO) Take 1 tablet by mouth daily.   Yes [provider]  gabapentin (NEURONTIN) 300 MG capsule TAKE 1 CAPSULE(300 MG) BY MOUTH THREE TIMES DAILY Patient taking differently: Take 300 mg by mouth 3 (three) times daily.  09/10/19  Yes Causey, Charlestine Massed, NP  guaiFENesin 200 MG tablet Take 200 mg by mouth every 4 (four) hours as needed for cough or to loosen phlegm.   Yes [provider]  metoCLOPramide (REGLAN) 10 MG tablet Take 1 tablet (10 mg total) by mouth every 8 (eight) hours as needed for nausea. 11/29/19  Yes Caccavale, Sophia, PA-C  ondansetron (ZOFRAN) 8 MG tablet Take 1 tablet (8 mg total) by mouth every 8 (eight) hours as needed for nausea or vomiting. 11/21/19  Yes Magrinat, [name], MD  oxyCODONE (OXY IR/ROXICODONE) 5 MG immediate release tablet Take 1 tablet (5 mg total) by mouth every 6 (six) hours as needed for severe pain. 02/29/20  Yes Kyung Rudd, MD  promethazine-codeine [facility] WITH CODEINE) 6.25-10 MG/5ML syrup Take 5 mLs by mouth 2 (two) times daily as needed for cough. 02/12/20  Yes Kyung Rudd, MD  valACYclovir (VALTREX) 1000 MG tablet Take 1 tablet (1,000 mg total) by mouth 2 (two) times daily. Patient taking differently: Take 1,000 mg by mouth 2 (two) times daily as needed (viral outbreak).  06/26/19  Yes Causey,  Charlestine Massed, NP  acyclovir ointment (ZOVIRAX) 5 % Apply 1 application topically 2 (two) times daily as needed. Mix 1 part acyclovir cream with 1 part over the counter hydrocortisone 1% cream and apply to affected area two times a day as needed Patient not taking: Reported on 03/04/2020 06/27/19   Gardenia Phlegm, NP  albuterol (VENTOLIN HFA) 108 (90 Base) MCG/ACT inhaler Inhale 2 puffs into the lungs 4 (four) times daily. 02/15/20   Magrinat, [name], MD  doxycycline (VIBRA-TABS) 100 MG tablet Take 1 tablet (100 mg total) by mouth 2 (two) times daily. Patient not taking: Reported on 03/04/2020 01/24/20   Gardenia Phlegm, NP  folic acid (FOLVITE) 1 MG tablet Take 1 tablet (1 mg total) by mouth daily. Patient  not taking: Reported on 03/04/2020 11/09/19   Magrinat, [name], MD  hydrocortisone (ANUSOL-HC) 2.5 % rectal cream Place 1 application rectally 2 (two) times daily. Patient not taking: Reported on 03/04/2020 07/18/19   Harle Stanford., PA-C  ketoconazole (NIZORAL) 2 % cream Apply 1 application topically daily. Patient not taking: Reported on 03/04/2020 08/15/19   Magrinat, [name], MD  PARoxetine (PAXIL) 10 MG tablet Take 1 tablet (10 mg total) by mouth daily. Patient not taking: Reported on 03/04/2020 05/11/19   Sandi Mealy E., PA-C  enalapril (VASOTEC) 10 MG tablet TAKE 1 TABLET BY MOUTH DAILY Patient taking differently: Take 10 mg by mouth daily.  03/15/19   Donnamae Jude, MD    Physical Exam: Constitutional: Moderately built and nourished. Vitals:   03/04/20 2014 03/04/20 2015 03/04/20 2030 03/04/20 2128  BP:  113/90 113/88 116/83  Pulse:  (!) 122 (!) 123 (!) 121  Resp: (!) 25 (!) 24 (!) 21 (!) 36  Temp:   98.5 F (36.9 C) 98.5 F (36.9 C)  TempSrc:   Oral Oral  SpO2:  100% 100% 100%  Weight:      Height:       Eyes: Anicteric no pallor. ENMT: No discharge from the ears eyes nose or mouth. Neck: No mass or.  No neck rigidity. Respiratory: No rhonchi or  crepitation. Cardiovascular: S1-S2 heard. Abdomen: Soft nontender bowel sound present. Musculoskeletal: No edema. Skin: No rash. Neurologic: Alert awake oriented time place and person.  Moves all extremities. Psychiatric: Appears normal per normal affect.   Labs on Admission: I have personally reviewed following labs and imaging studies  CBC: Recent Labs  Lab 03/04/20 1405  WBC 13.8*  NEUTROABS 12.7*  HGB 11.3*  HCT 35.6*  MCV 93.9  PLT 123456   Basic Metabolic Panel: Recent Labs  Lab 03/04/20 1405  NA 139  K 3.9  CL 101  CO2 26  GLUCOSE 116*  BUN 11  CREATININE 0.69  CALCIUM 9.3   GFR: Estimated Creatinine Clearance: 89.3 mL/min (by C-G formula based on SCr of 0.69 mg/dL). Liver Function Tests: Recent Labs  Lab 03/04/20 1405  AST 39  ALT 62*  ALKPHOS 188*  BILITOT 0.5  PROT 7.1  ALBUMIN 2.7*   Recent Labs  Lab 03/04/20 1620  LIPASE 15   No results for input(s): AMMONIA in the last 168 hours. Coagulation Profile: Recent Labs  Lab 03/04/20 2005  INR 1.2   Cardiac Enzymes: No results for input(s): CKTOTAL, CKMB, CKMBINDEX, TROPONINI in the last 168 hours. BNP (last 3 results) No results for input(s): PROBNP in the last 8760 hours. HbA1C: No results for input(s): HGBA1C in the last 72 hours. CBG: No results for input(s): GLUCAP in the last 168 hours. Lipid Profile: No results for input(s): CHOL, HDL, LDLCALC, TRIG, CHOLHDL, LDLDIRECT in the last 72 hours. Thyroid Function Tests: Recent Labs    03/04/20 1405  TSH 0.138*   Anemia Panel: No results for input(s): VITAMINB12, FOLATE, FERRITIN, TIBC, IRON, RETICCTPCT in the last 72 hours. Urine analysis:    Component Value Date/Time   COLORURINE YELLOW 11/29/2019 0138   APPEARANCEUR HAZY (A) 11/29/2019 0138   LABSPEC 1.020 11/29/2019 0138   PHURINE 6.0 11/29/2019 0138   GLUCOSEU NEGATIVE 11/29/2019 0138   HGBUR NEGATIVE 11/29/2019 0138   BILIRUBINUR NEGATIVE 11/29/2019 0138   KETONESUR 5 (A)  11/29/2019 0138   PROTEINUR NEGATIVE 11/29/2019 0138   UROBILINOGEN 4.0 (H) 01/13/2018 1417   NITRITE NEGATIVE 11/29/2019 0138  LEUKOCYTESUR MODERATE (A) 11/29/2019 0138   Sepsis Labs: @LABRCNTIP (procalcitonin:4,lacticidven:4) ) Recent Results (from the past 240 hour(s))  Respiratory Panel by RT PCR (Flu A&B, Covid) - Nasopharyngeal Swab     Status: None   Collection Time: 03/04/20  4:20 PM   Specimen: Nasopharyngeal Swab  Result Value Ref Range Status   SARS Coronavirus 2 by RT PCR NEGATIVE NEGATIVE Final    Comment: (NOTE) SARS-CoV-2 target nucleic acids are NOT DETECTED. The SARS-CoV-2 RNA is generally detectable in upper respiratoy specimens during the acute phase of infection. The lowest concentration of SARS-CoV-2 viral copies this assay can detect is 131 copies/mL. A negative result does not preclude SARS-Cov-2 infection and should not be used as the sole basis for treatment or other patient management decisions. A negative result may occur with  improper specimen collection/handling, submission of specimen other than nasopharyngeal swab, presence of viral mutation(s) within the areas targeted by this assay, and inadequate number of viral copies (<131 copies/mL). A negative result must be combined with clinical observations, patient history, and epidemiological information. The expected result is Negative. Fact Sheet for Patients:  PinkCheek.be Fact Sheet for Healthcare Providers:  GravelBags.it This test is not yet ap proved or cleared by the [place] FDA and  has been authorized for detection and/or diagnosis of SARS-CoV-2 by FDA under an Emergency Use Authorization (EUA). This EUA will remain  in effect (meaning this test can be used) for the duration of the COVID-19 declaration under Section 564(b)(1) of the Act, 21 U.S.C. section 360bbb-3(b)(1), unless the authorization is terminated or revoked sooner.     Influenza A by PCR NEGATIVE NEGATIVE Final   Influenza B by PCR NEGATIVE NEGATIVE Final    Comment: (NOTE) The Xpert Xpress SARS-CoV-2/FLU/RSV assay is intended as an aid in  the diagnosis of influenza from Nasopharyngeal swab specimens and  should not be used as a sole basis for treatment. Nasal washings and  aspirates are unacceptable for Xpert Xpress SARS-CoV-2/FLU/RSV  testing. Fact Sheet for Patients: PinkCheek.be Fact Sheet for Healthcare Providers: GravelBags.it This test is not yet approved or cleared by the [place] FDA and  has been authorized for detection and/or diagnosis of SARS-CoV-2 by  FDA under an Emergency Use Authorization (EUA). This EUA will remain  in effect (meaning this test can be used) for the duration of the  Covid-19 declaration under Section 564(b)(1) of the Act, 21  U.S.C. section 360bbb-3(b)(1), unless the authorization is  terminated or revoked. Performed at [facility], [place] [address], [place], [place] [address]      Radiological Exams on Admission: DG Chest 2 View  Result Date: 03/04/2020 CLINICAL DATA:  Cough, severe shortness of breath, and tachycardia. Known metastatic breast cancer to lung. EXAM: CHEST - 2 VIEW COMPARISON:  Radiograph 02/07/2020, chest CT 01/31/2020 FINDINGS: Large right pleural effusion which has reaccumulated since recent thoracentesis, only small portion of the right upper lung is aerated. Left chest port remains in place. Interstitial thickening in the left lung which appears new. Heart appears normal in size. No significant left pleural effusion. T1 compression fracture on prior chest CT is not well demonstrated radiographically. No definite new osseous abnormalities. Right mastectomy with surgical clips in the axilla. IMPRESSION: 1. Large right pleural effusion which has increased in size since recent thoracentesis, only small portion of the right  upper lung is aerated. 2. New interstitial thickening in the left lung, may be pulmonary edema or atypical infection. Electronically Signed   By: Threasa Beards  Sanford M.D.   On: 03/04/2020 15:33   CT Angio Chest PE W and/or Wo Contrast  Result Date: 03/04/2020 CLINICAL DATA:  History of carcinoma of the breast with right-sided chest pain and pleural effusion EXAM: CT ANGIOGRAPHY CHEST WITH CONTRAST TECHNIQUE: Multidetector CT imaging of the chest was performed using the standard protocol during bolus administration of intravenous contrast. Multiplanar CT image reconstructions and MIPs were obtained to evaluate the vascular anatomy. CONTRAST:  15mL OMNIPAQUE IOHEXOL 350 MG/ML SOLN COMPARISON:  Chest x-ray from earlier in the day. FINDINGS: Cardiovascular: Thoracic aorta and its branches are within normal limits. No aneurysmal dilatation or dissection is seen. No cardiac enlargement is noted. No significant coronary artery disease is seen. Pulmonary artery shows poor opacification one of the peripheral lower lobe branches on the left posteriorly consistent with small pulmonary embolus. No other sizable emboli are seen. Mediastinum/Nodes: Thoracic inlet is within normal limits. Increased soft tissue density is noted in the region of the right hilum and subcarinal region consistent with lymphadenopathy and local mass. This is stable in appearance from the prior exam. The esophagus is within normal limits. Lungs/Pleura: Left lung is well aerated and demonstrates some new patchy ground-glass opacification increased from the prior exam. Some small parenchymal nodules are noted as well with slight increase in size particularly in the left upper lobe on image number 49 of series 10. The nodule measures 4 mm and previously measured approximately 3 mm. Increasing right perihilar density is noted a combination of hilar adenopathy and peripheral consolidation. The lower lobe consolidation is stable although the degree of upper  and middle lobe consolidation has increased in the interval from the prior exam. Large pleural effusion is noted increased from the prior study. Some suggestion of pleural enhancement is noted likely representing pleural metastatic disease. Upper Abdomen: Upper abdomen shows increase in size and number of the hepatic metastatic disease. Specifically a caudate lobe lesion which previously measured 2.2 cm in greatest dimension now measures at least 4.3 cm in greatest dimension. Remainder of the upper abdomen is within normal limits. Musculoskeletal: Healing fracture of the right anterior eighth rib is noted. Compression deformity is noted at T1 increased when compared with the prior exam. This likely represents a pathologic fracture. Changes of prior right mastectomy are noted. Review of the MIP images confirms the above findings. IMPRESSION: Tiny left lower lobe pulmonary embolus. Increase in right-sided pleural effusion with evidence of pleural enhancement suggestive of metastatic deposits. Increase in the degree of right lung consolidation secondary to central adenopathy and likely underlying mass. Increase in size and number of metastatic lesions within the liver as described. Increase in size of nodular changes within the left lung as well as some new ground-glass opacities. Correlation with the pending COVID-19 testing is recommended. Healing fracture of the right anterior eighth rib. Increased compression deformity at T1 likely pathologic in nature. Electronically Signed   By: Inez Catalina M.D.   On: 03/04/2020 18:40    EKG: Independently reviewed.  Sinus tachycardia.  Assessment/Plan Principal Problem:   Acute respiratory failure (HCC) Active Problems:   Malignant pleural effusion   Pulmonary embolism (HCC)   Acute respiratory failure with hypoxia (HCC)    1. Acute respiratory failure with hypoxia likely from worsening malignant pleural effusion for which I have consulted pulmonary critical care  for further recommendation.  At this time pulmonary critical care is recommending to hold off heparin at around 8 AM and they are planning to do thoracentesis later.  If  patient symptoms improved with thoracentesis then will need Pleurx catheter.  Plan of holding heparin was discussed with patient and patient's nurse. 2. Small left-sided pulmonary embolism for which patient is on heparin at this time.  See #1 regarding holding heparin. 3. Metastatic breast cancer being followed by Dr. Jana Hakim. 4. 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  Follow CBC. 5. History of asthma presently placing patient on Pulmicort and Xopenex since patient states these have actually improved her symptoms.  Patient is also on Decadron.  Given the acute respiratory failure with the worsening pleural effusion breath respiratory status patient will be admitted as inpatient status.   DVT prophylaxis: Presently on heparin infusion. Code Status: Full code. Family Communication: Discussed with patient. Disposition Plan: Home. Consults called: Pulmonary critical care. Admission status: Inpatient.   Rise Patience MD [facility] Pager [phone].  If 7PM-7AM, please contact night-coverage [email or web address] Password [facility]  03/04/2020, 9:49 PM

## 2020-03-04 NOTE — Progress Notes (Signed)
Date and time of tPA/Alteplase insertion: 03/04/20 @ [place], Gillermina Phy, RN

## 2020-03-04 NOTE — Consult Note (Signed)
NAME:  Terri Estes, MRN:  [id number], DOB:  1986-06-07, LOS: 0 ADMISSION DATE:  03/04/2020, CONSULTATION DATE:  03/04/2020 REFERRING MD:  Dr. Hal Hope, CHIEF COMPLAINT:  Pleural effusion, resp distress  Brief History   34 year old female with history significant for metastatic breast cancer s/p recent right thoracentesis on 02/07/2020 with cytology c/w malignant effusion.  Resented to radiation today with complaints of shortness of breath, fatigue, hemoptysis found to be tachypneic, tachycardic, and saturations in the 90s on room air.  Sent to ER for further evaluation CTA PE revealed a tiny left lower lobe PE and large right pleural effusion.  Started on heparin gtt.  Admitted to [facility].  PCCM consulted for concerns of ongoing respiratory difficulties and malignant pleural effusion.  History of present illness    34 year old female with past medical history significant for but not limited to breast cancer which was diagnosed 04/19/2019 for a clinical T4 N2 MX, stage IIICinvasive ductal carcinoma, grade 3, triple negative, with an MIB-1 of 90%.  She is followed by Dr. Jana Hakim with oncology.  Pembrolizumab, carboplatin, and paclitaxel was started 04/2019 but stopped 06/2019 due to neuropathy; cyclophosphamide 06/2019 to 01/2020 and doxorubicin 06/2019 to 08/2019. Underwent right mastectomy and axillary lymph node dissection on 10/16/2019.  Most recently started on capecitabine 02/13/2020.  Radiation started 01/23/2020.  Recent chest CT on 01/31/20 revealed new liver and lung lesions, right pleural effusion, mediastinal adenopathy, and T1 wedge deformity.  Underwent a right thoracentesis on 02/07/2020 which yielded 800 ml with cytology confirmed malignant cells consistent with metastatic adenocarcinoma with immunohistochemistry positive for GATA-3 consistent with primary breast cancer.    Presented for radiation today but found short of breath tachypneic tachycardic complaining of worsening fatigue and some  hemoptysis with worsening right-sided chest pain. CXR showed near whiteout of the right lung and therefore sent to the ER for further work-up.  She denies fever, chills, leg swelling or pain, nausea, or vomiting.  On ER work-up, her saturations noted to be afebrile, saturations in the 90s on room air and tachypneic in the 40s.  Labs noted for alk phos 188, albumin 2.7, ALT 62, WBC 13.8, Hgb stable at 11.3, TSH 0.138.  COVID and flu negative.  Underwent CTA PE study revealed a tiny left lower lobe pulmonary embolus and increased in size large right effusion.  She was started on heparin gtt. currently she remains hemodynamically stable with ongoing tachypnea and requiring 2 L oxygen.  PCCM consulted for further concerns of tachypnea/ respiratory distress and malignant pleural effusion.   Past Medical History  Right breast cancer on chemo/ radiation and s/p right mastectomy and axillary lymph node dissection 10/16/2019, tobacco abuse, asthma, anemia, anxiety, depression, GERD, psoriasis, polysubstance abuse (THC and cocaine positive 12/13/2019)  Significant Hospital Events   3/9 Admit [facility]  Consults:  PCCM  Procedures:   Significant Diagnostic Tests:  3/9 CTA PE >> Tiny left lower lobe pulmonary embolus. Increase in right-sided pleural effusion with evidence of pleural enhancement suggestive of metastatic deposits. Increase in the degree of right lung consolidation secondary to central adenopathy and likely underlying mass. Increase in size and number of metastatic lesions within the liver as described. Increase in size of nodular changes within the left lung as well as some new ground-glass opacities. Correlation with the pending COVID-19 testing is recommended. Healing fracture of the right anterior eighth rib. Increased compression deformity at T1 likely pathologic in nature.  Micro Data:  3/9 SARS2 / Flu A/B >> negative  Antimicrobials:   Interim history/subjective:  NA  Objective     Blood pressure 114/77, pulse (!) 121, temperature 97.9 F (36.6 C), temperature source Oral, resp. rate (!) 36, height '5\' 2"'  (1.575 m), weight 63.5 kg, SpO2 100 %, not currently breastfeeding.       No intake or output data in the 24 hours ending 03/04/20 2242 Filed Weights   03/04/20 1549  Weight: 63.5 kg   Examination: General: Patient alert and speaking clearly  HENT: Patient with moist mucous membranes Lungs: Diminished breath sounds on the right with dullness to percussion. Able to speak in full sentences with minimal dyspnea.  Cardiovascular: RRR Abdomen: Soft non tender Extremities: There is a hypopigmented rash noted diffusely Neuro: No focal deficits congntion appears normal GU: Deferred  Resolved Hospital Problem list    Assessment & Plan:   Acute hypoxic respiratory insufficiency in the setting of large right pleural effusion and small left lower lobe PE in the setting of metastatic breast cancer.  Here noted to have large right pleural effusion. Patient notes she had 700 cc's taken off in the past with no relief from it. In fact it has made cough worse. She has had stable SOB and cough since her previous admission when she had thor done. So the idea of pleurx is questionable if previous thoracentesis did not help P:  -Would recommend repeat thora in AM to see if this help with dyspnea. It may have been since only 700 cc's pulled off that perhaps it was not enough. Although patient noted chest tightness which would indicate that she likely has trapped vs entrapped lung.  -Patient was on heparin which would be contraindication to thora this evening, and it was not emergent as patient was resting comfortably -Stop heparin drip at 6-8 AM. Can pursue thoracentesis in afternoon to see if this helps symptoms. If it does this time around can consider pleurx in future.   -Of note patient requests pain/anxiety medication prior as last time it was a very unpleasant experience.     Tiny left PE-Can continue heparin drip and outpatient AC -Hold heparin drip in AM for procedure.      Asthma-Noted to have improved with QVAR previously. Has gotten PO steroids in the past and this has been helpful. Not wheezing on exam not in exacerbation -Would consider steroid inhaler for her like flovent or even combination inhaler like breo or symbicort   Tobacco abuse  P:  Prn albuterol  Cessation counseling      Best practice:  Diet: per primary  Pain/Anxiety/Delirium protocol (if indicated): n/a VAP protocol (if indicated): n/a DVT prophylaxis: heparin gtt GI prophylaxis: n/a Glucose control: n/a Mobility: as tolerated Code Status: Full per primary Family Communication: Not necessary. Patient can do this on her own Disposition: Remain on floor for now  Labs   CBC: Recent Labs  Lab 03/04/20 1405  WBC 13.8*  NEUTROABS 12.7*  HGB 11.3*  HCT 35.6*  MCV 93.9  PLT 665    Basic Metabolic Panel: Recent Labs  Lab 03/04/20 1405  NA 139  K 3.9  CL 101  CO2 26  GLUCOSE 116*  BUN 11  CREATININE 0.69  CALCIUM 9.3   GFR: Estimated Creatinine Clearance: 89.3 mL/min (by C-G formula based on SCr of 0.69 mg/dL). Recent Labs  Lab 03/04/20 1405  WBC 13.8*    Liver Function Tests: Recent Labs  Lab 03/04/20 1405  AST 39  ALT 62*  ALKPHOS 188*  BILITOT 0.5  PROT 7.1  ALBUMIN 2.7*   Recent Labs  Lab 03/04/20 1620  LIPASE 15   No results for input(s): AMMONIA in the last 168 hours.  ABG    Component Value Date/Time   HCO3 TEST WILL BE CREDITED 07/10/2017 2127     Coagulation Profile: Recent Labs  Lab 03/04/20 2005  INR 1.2    Cardiac Enzymes: No results for input(s): CKTOTAL, CKMB, CKMBINDEX, TROPONINI in the last 168 hours.  HbA1C: Hgb A1c MFr Bld  Date/Time Value Ref Range Status  12/16/2016 01:39 PM 5.3 4.8 - 5.6 % Final    Comment:             Pre-diabetes: 5.7 - 6.4          Diabetes: >6.4          Glycemic control  for adults with diabetes: <7.0     CBG: No results for input(s): GLUCAP in the last 168 hours.  Review of Systems:   Pertinent positive and negative per HPI. 14 point ROS negative unless otherwise stated.   Past Medical History  She,  has a past medical history of Anemia, Anxiety, Asthma, Cancer ([place]), Chronic pain, Depression, Family history of breast cancer, GERD (gastroesophageal reflux disease), History of substance abuse ([place]), Pregnancy induced hypertension, Psoriasis, Trichomonas infection, and Vaginal Pap smear, abnormal.   Surgical History    Past Surgical History:  Procedure Laterality Date  . CESAREAN SECTION  05/26/2008  . CESAREAN SECTION N/A 07/10/2017   Procedure: CESAREAN SECTION;  Surgeon: Florian Buff, MD;  Location: [place];  Service: Obstetrics;  Laterality: N/A;  . CESAREAN SECTION N/A 09/23/2018   Procedure: CESAREAN SECTION;  Surgeon: Donnamae Jude, MD;  Location: [place];  Service: Obstetrics;  Laterality: N/A;  . COLPOSCOPY    . MASTECTOMY WITH AXILLARY LYMPH NODE DISSECTION Right 10/16/2019   Procedure: RIGHT MASTECTOMY WITH AXILLARY LYMPH NODE DISSECTION;  Surgeon: Alphonsa Overall, MD;  Location: [place];  Service: General;  Laterality: Right;  . PORTACATH PLACEMENT Left 04/30/2019   Procedure: INSERTION PORT-A-CATH WITH ULTRASOUND;  Surgeon: Alphonsa Overall, MD;  Location: [facility];  Service: General;  Laterality: Left;  Left Subclavian vein  . WISDOM TOOTH EXTRACTION       Social History   reports that she has been smoking cigarettes. She has been smoking about 0.10 packs per day for the past 0.00 years. She has never used smokeless tobacco. She reports current drug use. Frequency: 7.00 times per week. Drug: Marijuana. She reports that she does not drink alcohol.   Family History   Her family history includes Breast cancer in her paternal aunt, paternal grandmother, paternal great-grandmother, and another family member; Breast cancer (age of  onset: 90) in her maternal grandmother; Cerebral palsy in her child; Diabetes in her paternal grandmother; Kidney disease in her maternal grandfather, maternal grandmother, paternal grandfather, and paternal grandmother; Pancreatic cancer in an other family member; Throat cancer in her paternal uncle.   Allergies No Known Allergies   Home Medications  Prior to Admission medications   Medication Sig Start Date End Date Taking? Authorizing Provider  albuterol (VENTOLIN HFA) 108 (90 Base) MCG/ACT inhaler Inhale 2 puffs into the lungs every 6 (six) hours as needed for wheezing or shortness of breath. 02/15/20  Yes Magrinat, [name], MD  buprenorphine (SUBUTEX) 8 MG SUBL SL tablet Place 8 mg under the tongue 2 (two) times daily.    Yes [provider]  capecitabine (XELODA) 500 MG  tablet Take 2 tablets (1,000 mg total) by mouth 2 (two) times daily after a meal. Take Monday-Friday. Take only on days of radiation. 02/08/20  Yes Magrinat, [name], MD  cholestyramine (QUESTRAN) 4 g packet Take 1 packet (4 g total) by mouth 3 (three) times daily with meals. Patient taking differently: Take 4 g by mouth 3 (three) times daily with meals as needed (diarrhea).  05/11/19  Yes Magrinat, [name], MD  cyclobenzaprine (FLEXERIL) 10 MG tablet Take 1 tablet (10 mg total) by mouth 3 (three) times daily as needed for muscle spasms. 09/26/18  Yes Donnamae Jude, MD  dexamethasone (DECADRON) 4 MG tablet Take 1 tablet (4 mg total) by mouth daily with breakfast. 02/15/20  Yes Magrinat, [name], MD  Famotidine (PEPCID PO) Take 1 tablet by mouth daily.   Yes [provider]  gabapentin (NEURONTIN) 300 MG capsule TAKE 1 CAPSULE(300 MG) BY MOUTH THREE TIMES DAILY Patient taking differently: Take 300 mg by mouth 3 (three) times daily.  09/10/19  Yes Causey, Charlestine Massed, NP  guaiFENesin 200 MG tablet Take 200 mg by mouth every 4 (four) hours as needed for cough or to loosen phlegm.   Yes [provider]  metoCLOPramide (REGLAN) 10 MG tablet Take 1 tablet (10 mg total) by mouth every 8 (eight) hours as needed for nausea. 11/29/19  Yes Caccavale, Sophia, PA-C  ondansetron (ZOFRAN) 8 MG tablet Take 1 tablet (8 mg total) by mouth every 8 (eight) hours as needed for nausea or vomiting. 11/21/19  Yes Magrinat, [name], MD  oxyCODONE (OXY IR/ROXICODONE) 5 MG immediate release tablet Take 1 tablet (5 mg total) by mouth every 6 (six) hours as needed for severe pain. 02/29/20  Yes Kyung Rudd, MD  promethazine-codeine [facility] WITH CODEINE) 6.25-10 MG/5ML syrup Take 5 mLs by mouth 2 (two) times daily as needed for cough. 02/12/20  Yes Kyung Rudd, MD  valACYclovir (VALTREX) 1000 MG tablet Take 1 tablet (1,000 mg total) by mouth 2 (two) times daily. Patient taking differently: Take 1,000 mg by mouth 2 (two) times daily as needed (viral outbreak).  06/26/19  Yes Causey, Charlestine Massed, NP  acyclovir ointment (ZOVIRAX) 5 % Apply 1 application topically 2 (two) times daily as needed. Mix 1 part acyclovir cream with 1 part over the counter hydrocortisone 1% cream and apply to affected area two times a day as needed Patient not taking: Reported on 03/04/2020 06/27/19   Gardenia Phlegm, NP  albuterol (VENTOLIN HFA) 108 (90 Base) MCG/ACT inhaler Inhale 2 puffs into the lungs 4 (four) times daily. 02/15/20   Magrinat, [name], MD  doxycycline (VIBRA-TABS) 100 MG tablet Take 1 tablet (100 mg total) by mouth 2 (two) times daily. Patient not taking: Reported on 03/04/2020 01/24/20   Gardenia Phlegm, NP  folic acid (FOLVITE) 1 MG tablet Take 1 tablet (1 mg total) by mouth daily. Patient not taking: Reported on 03/04/2020 11/09/19   Magrinat, [name], MD  hydrocortisone (ANUSOL-HC) 2.5 % rectal cream Place 1 application rectally 2 (two) times daily. Patient not taking: Reported on 03/04/2020 07/18/19   Harle Stanford., PA-C  ketoconazole (NIZORAL) 2 % cream Apply 1 application topically daily. Patient not taking:  Reported on 03/04/2020 08/15/19   Magrinat, [name], MD  PARoxetine (PAXIL) 10 MG tablet Take 1 tablet (10 mg total) by mouth daily. Patient not taking: Reported on 03/04/2020 05/11/19   Sandi Mealy E., PA-C  enalapril (VASOTEC) 10 MG tablet TAKE 1 TABLET  BY MOUTH DAILY Patient taking differently: Take 10 mg by mouth daily.  03/15/19   Donnamae Jude, MD     60 min

## 2020-03-04 NOTE — Telephone Encounter (Signed)
Post Xray- pt returned to clinic via wheelchair-  Seen by MD- with request to send pt to ER for further evaluation.  Pt taken to ER via [place] by tech accompanied by RN.  Report given to ER attending nurse, [place].  Pt put in hospital gown and assisted into bed with side rails up x 2.  No further needs by this RN.

## 2020-03-04 NOTE — ED Triage Notes (Signed)
Patient brought over by the [facility], she is SOB, tachypnic, and tachycardic, maintaining sats on RA, 98%. CA pt, oral radiation, CXR showed masses in bilateral lungs. Denies pain.

## 2020-03-04 NOTE — ED Provider Notes (Signed)
34 yo F with a significant past medical history of breast cancer comes in with a chief complaint of shortness of breath.  Was seen in the office today and found to be significantly tachypneic with a almost complete whiteout of the right lung.  Plan is for a CT angiogram of the chest and admission to the hospital.  CTA does show a tiny left PE.  Will start on Lovenox.  Discussed with the hospitalist for admission. CRITICAL CARE Performed by: Cecilio Asper   Total critical care time: 35 minutes  Critical care time was exclusive of separately billable procedures and treating other patients.  Critical care was necessary to treat or prevent imminent or life-threatening deterioration.  Critical care was time spent personally by me on the following activities: development of treatment plan with patient and/or surrogate as well as nursing, discussions with consultants, evaluation of patient's response to treatment, examination of patient, obtaining history from patient or surrogate, ordering and performing treatments and interventions, ordering and review of laboratory studies, ordering and review of radiographic studies, pulse oximetry and re-evaluation of patient's condition.    Deno Etienne, DO 03/04/20 1906

## 2020-03-04 NOTE — Progress Notes (Signed)
[facility] consulted to assess left chest port as it isn't drawing back blood. Pt reported she had issues with blood return during previous visit, but nothing was done to correct the issue. Attempted to obtain blood return with repositioning and coughing; without success. Port flushed easily with NS. Saline locked while awaiting TPA from pharmacy.

## 2020-03-04 NOTE — Progress Notes (Signed)
Terri Estes came in today  for her radiation treatments and was very SOB. She was brought upstairs for further evaluation and found to be AF, satting well but with a HR 130 and RR>20. Hb was 11.3. We obtained a CXR and it shows whiteout of the R lung. This could simply be reaccumulation of her malignant effusion vs tumor vs clot related. She was transferred to the ED for further evaluation and possible admission.   ASSESSMENT: 34 y.o. Terri Estes. [place] woman status post right breast biopsy x2 and right axillary lymph node biopsy 04/19/2019 for a clinical T4 N2 MX, stage IIIC invasive ductal carcinoma, grade 3, triple negative, with an MIB-1 of 90%             (a) chest CT scan 04/11/2019 shows multiple subcentimeter lung nodules             (b) PET scan denied by insurance             (c) CT A/P and bone scan due 05/09/2019 show a very small low-density lesion in the lateral segment of the left liver which is indeterminate, and sclerosis of the right SI joint, which is felt to be likely degenerative; bone scan was entirely negative.             (c) baseline breast MRI 04/30/2019 shows involvement of all 4 quadrants in the right breast as well as right axillary subpectoral and internal mammary lymphadenopathy.  Left breast was unremarkable  (1) neoadjuvant chemotherapy consisting of             (a) pembrolizumab started 05/03/2019, third dose delayed because of elevated liver function tests, resumed 08/02/2019             (b) paclitaxel weekly x12 with carboplatin every 21 days x 4, started 05/08/2019                         (i) tolerated carboplatin AUC 5-day 1 poorly, switched to AUC 2 on day 1 day 8                         (ii) cycle 2 delayed 1 week because of elevated liver function tests                         (iii) carboplatin and paclitaxel discontinued after 07/03/2019 because of neuropathy, (received 7 weekly doses paclitaxel and one AUC5 carbo dose, four AUC2 doses)             (c) cyclophosphamide  and doxorubicin x4 started 07/03/2019, completed 08/29/2019                         (i) 2d dose given day 28 due to poor tolerance; other doses Q 21 days              (2) s/p right mastectomy and ALND 10/16/2019 showing a residual ypT2 ypN2 invasive ductal carcinoma, grade 3, with negative margins             (a) a total of 10 lymph nodes were removed, 4 with macrometastases, 1 with a micrometastasis  (3) adjuvant radiation started 01/22/2019 [facility])  (a) started capecitabine at radiosensitizing doses 02/13/2020  (4) genetics testing             (a) genetic testing 05/14/2019 through the Common Hereditary Gene Panel offered by  [facility] found no deleterious mutations in APC, ATM, AXIN2, BARD1, BMPR1A, BRCA1, BRCA2, BRIP1, CDH1, CDK4, CDKN2A (p14ARF), CDKN2A (p16INK4a), CHEK2, CTNNA1, DICER1, EPCAM (Deletion/duplication testing only), GREM1 (promoter region deletion/duplication testing only), KIT, MEN1, MLH1, MSH2, MSH3, MSH6, MUTYH, NBN, NF1, NHTL1, PALB2, PDGFRA, PMS2, POLD1, POLE, PTEN, RAD50, RAD51C, RAD51D, RNF43, SDHB, SDHC, SDHD, SMAD4, SMARCA4. STK11, TP53, TSC1, TSC2, and VHL. The following genes were evaluated for sequence changes only: SDHA and HOXB13 c.251G>A variant only  (5) goserelin started 05/08/2019, repeated monthly  (6) pembrolizumab started 08/02/2019, discontinued after fourth dose (10/02/2019) with psoriasis flare  (7) psoriasis:             (a) weekly methotrexate started 11/10/2019, held after 11/17/2019 dose so as not to overlap with radiation             (b) intravenous cyclophosphamide 1 g every 3 weeks starting 11/21/2019, last dose January 2021  (8) METASTATIC/PROGRESSIVE DISEASE: FEB 2021             (a) CT chest 01/31/2020 shows new lung and liver lesions, right pleural effusion, mediastinal adenopathy, T1 wedge deformity             (b) brain MRI             (c) right thoracentesis 02/07/2020: cytologically positive for triple negative GATA-3 positive  cells

## 2020-03-04 NOTE — Telephone Encounter (Signed)
This RN assessed pt per concern for " coughing up blood " reported yesterday at [place] appointment.  CBC obtained with heme of 11.3 and plt of 154,000.  Pt states she is having severe sob and fatigue " I have a bathroom in my bedroom but I can barely get there "  She states she is having a lot of coughing with frothy mucus " and gets me all choked up ".  This RN obtained O2 sat with reading of 99 pulse 121 - with ambulation - saturation maintained but pulse increased to 131 with pt having noted distress.  Above report given to MD- with request for pt to have CXR - this RN ordered STAT and pt was taken by wheelchair with tech to radiology.  Pt is to return for MD to see and assess further.

## 2020-03-04 NOTE — ED Notes (Signed)
TPA removed from pt port. Port has good return and flushes.

## 2020-03-04 NOTE — ED Provider Notes (Signed)
[facility] DEPT Provider Note   CSN: [id number] Arrival date & time: 03/04/20  1543     History Chief Complaint  Patient presents with  . Shortness of Breath    Terri Estes is a 34 y.o. female.  The history is provided by the patient and medical records. No language interpreter was used.  Shortness of Breath Severity:  Severe Onset quality:  Gradual Duration:  3 days Timing:  Constant Progression:  Worsening Chronicity:  Recurrent Context: not URI   Relieved by:  Nothing Worsened by:  Deep breathing, exertion, movement and coughing Ineffective treatments:  None tried Associated symptoms: chest pain, cough, hemoptysis and sputum production   Associated symptoms: no abdominal pain, no diaphoresis, no fever, no headaches, no neck pain, no rash, no syncope, no vomiting and no wheezing   Risk factors: hx of cancer   Risk factors: no hx of PE/DVT        Past Medical History:  Diagnosis Date  . Anemia    after pregnancy  . Anxiety    severe not taking meds  . Asthma    inhaler  4x per day  . Cancer ([place])   . Chronic pain   . Depression   . Family history of breast cancer   . GERD (gastroesophageal reflux disease)   . History of substance abuse ([place])   . Pregnancy induced hypertension    takes medication now.  . Psoriasis    all over body  . Trichomonas infection   . Vaginal Pap smear, abnormal     Patient Active Problem List   Diagnosis Date Noted  . 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 Encounter for antineoplastic immunotherapy 08/22/2019  . 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 Genetic testing 05/14/2019  . Family history of breast cancer   . Malignant neoplasm of overlapping sites of right breast in female, estrogen receptor negative ([place]) 04/25/2019  . Acute kidney injury ([place]) 09/29/2018  . 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 Acute blood loss anemia 09/23/2018  . DIC  (disseminated intravascular coagulation) ([place]) 09/23/2018  . S/P cesarean section 09/23/2018  . Inadequate social support 07/21/2018  . Sciatica 07/21/2018  . Abdominal muscle strain, initial encounter 07/21/2018  . History of substance abuse ([place]) 06/12/2018  . Supervision of other normal pregnancy, antepartum 05/10/2018  . Short interval between pregnancies affecting pregnancy, antepartum 05/10/2018  . Hx of preeclampsia, prior pregnancy, currently pregnant 05/10/2018  . History of preterm delivery, currently pregnant 05/10/2018  . Depression affecting pregnancy, antepartum 05/10/2018  . HSIL (high grade squamous intraepithelial lesion) on Pap smear of cervix 12/24/2016  . History of cesarean delivery 12/16/2016    Past Surgical History:  Procedure Laterality Date  . CESAREAN SECTION  05/26/2008  . CESAREAN SECTION N/A 07/10/2017   Procedure: CESAREAN SECTION;  Surgeon: Florian Buff, MD;  Location: [facility];  Service: Obstetrics;  Laterality: N/A;  . CESAREAN SECTION N/A 09/23/2018   Procedure: CESAREAN SECTION;  Surgeon: Donnamae Jude, MD;  Location: [facility];  Service: Obstetrics;  Laterality: N/A;  . COLPOSCOPY    . MASTECTOMY WITH AXILLARY LYMPH NODE DISSECTION Right 10/16/2019   Procedure: RIGHT MASTECTOMY WITH AXILLARY LYMPH NODE DISSECTION;  Surgeon: Alphonsa Overall, MD;  Location: [facility];  Service: General;  Laterality: Right;  . PORTACATH PLACEMENT Left 04/30/2019   Procedure: INSERTION PORT-A-CATH WITH ULTRASOUND;  Surgeon: Alphonsa Overall, MD;  Location: [facility];  Service: General;  Laterality: Left;  Left Subclavian vein  . WISDOM TOOTH EXTRACTION       OB History    Gravida  6   Para  3   Term  1   Preterm  2   AB  3   Living  3     SAB      TAB  3   Ectopic      Multiple  0   Live Births  3           Family History  Problem Relation Age of Onset  . Kidney disease Maternal Grandmother   . Breast cancer Maternal Grandmother 28         metastatic to liver; d. 4  . Kidney disease Maternal Grandfather   . Kidney disease Paternal Grandmother   . Diabetes Paternal Grandmother   . Breast cancer Paternal Grandmother   . Kidney disease Paternal Grandfather   . Breast cancer Paternal Aunt   . Cerebral palsy Child   . Breast cancer Paternal Great-grandmother   . Pancreatic cancer Other        PGMs brother   . Throat cancer Paternal Uncle   . Breast cancer Other        PGMs sister    Social History   Tobacco Use  . Smoking status: Current Every Day Smoker    Packs/day: 0.10    Years: 0.00    Pack years: 0.00    Types: Cigarettes  . Smokeless tobacco: Never Used  . Tobacco comment: trying to quit - smokes 2 cigarettes/day  Substance Use Topics  . Alcohol use: No  . Drug use: Yes    Frequency: 7.0 times per week    Types: Marijuana    Comment: subutex last dose 10/15/19, last dose Sun 10/14/19    Home Medications Prior to Admission medications   Medication Sig Start Date End Date Taking? Authorizing Provider  acyclovir ointment (ZOVIRAX) 5 % Apply 1 application topically 2 (two) times daily as needed. Mix 1 part acyclovir cream with 1 part over the counter hydrocortisone 1% cream and apply to affected area two times a day as needed 06/27/19   Gardenia Phlegm, NP  albuterol (VENTOLIN HFA) 108 (90 Base) MCG/ACT inhaler Inhale 2 puffs into the lungs every 6 (six) hours as needed for wheezing or shortness of breath. 02/15/20   Magrinat, [name], MD  albuterol (VENTOLIN HFA) 108 (90 Base) MCG/ACT inhaler Inhale 2 puffs into the lungs 4 (four) times daily. 02/15/20   Magrinat, [name], MD  buprenorphine (SUBUTEX) 8 MG SUBL SL tablet Place 8 mg under the tongue 2 (two) times daily.     [provider]  capecitabine (XELODA) 500 MG tablet Take 2 tablets (1,000 mg total) by mouth 2 (two) times daily after a meal. Take Monday-Friday. Take only on days of radiation. 02/08/20   Magrinat, [name], MD   cholestyramine Lucrezia Starch) 4 g packet Take 1 packet (4 g total) by mouth 3 (three) times daily with meals. 05/11/19   Magrinat, [name], MD  cyclobenzaprine (FLEXERIL) 10 MG tablet Take 1 tablet (10 mg total) by mouth 3 (three) times daily as needed for muscle spasms. 09/26/18   Donnamae Jude, MD  dexamethasone (DECADRON) 4 MG tablet Take 1 tablet (4 mg total) by mouth daily with breakfast. 02/15/20   Magrinat, [name], MD  doxycycline (VIBRA-TABS) 100 MG tablet Take 1 tablet (100 mg total) by mouth 2 (two) times daily. 01/24/20   Wilber Bihari  Cornetto, NP  folic acid (FOLVITE) 1 MG tablet Take 1 tablet (1 mg total) by mouth daily. 11/09/19   Magrinat, [name], MD  gabapentin (NEURONTIN) 300 MG capsule TAKE 1 CAPSULE(300 MG) BY MOUTH THREE TIMES DAILY Patient taking differently: Take 300 mg by mouth 3 (three) times daily.  09/10/19   Gardenia Phlegm, NP  hydrocortisone (ANUSOL-HC) 2.5 % rectal cream Place 1 application rectally 2 (two) times daily. 07/18/19   Tanner, Lyndon Code., PA-C  ketoconazole (NIZORAL) 2 % cream Apply 1 application topically daily. 08/15/19   Magrinat, [name], MD  metoCLOPramide (REGLAN) 10 MG tablet Take 1 tablet (10 mg total) by mouth every 8 (eight) hours as needed for nausea. 11/29/19   Caccavale, Sophia, PA-C  ondansetron (ZOFRAN) 8 MG tablet Take 1 tablet (8 mg total) by mouth every 8 (eight) hours as needed for nausea or vomiting. 11/21/19   Magrinat, [name], MD  oxyCODONE (OXY IR/ROXICODONE) 5 MG immediate release tablet Take 1 tablet (5 mg total) by mouth every 6 (six) hours as needed for severe pain. 02/29/20   Kyung Rudd, MD  PARoxetine (PAXIL) 10 MG tablet Take 1 tablet (10 mg total) by mouth daily. 05/11/19   Tanner, Lyndon Code., PA-C  Prenatal MV-Min-FA-Omega-3 (PRENATAL GUMMIES/DHA & FA) 0.4-32.5 MG CHEW Chew 2 each by mouth daily.    [provider]  promethazine-codeine (PHENERGAN WITH CODEINE) 6.25-10 MG/5ML syrup Take 5 mLs by mouth 2 (two) times  daily as needed for cough. 02/12/20   Kyung Rudd, MD  valACYclovir (VALTREX) 1000 MG tablet Take 1 tablet (1,000 mg total) by mouth 2 (two) times daily. 06/26/19   Gardenia Phlegm, NP  enalapril (VASOTEC) 10 MG tablet TAKE 1 TABLET BY MOUTH DAILY Patient taking differently: Take 10 mg by mouth daily.  03/15/19   Donnamae Jude, MD    Allergies    Patient has no known allergies.  Review of Systems   Review of Systems  Constitutional: Negative for chills, diaphoresis, fatigue and fever.  HENT: Negative for congestion.   Respiratory: Positive for cough, hemoptysis, sputum production, chest tightness and shortness of breath. Negative for choking, wheezing and stridor.   Cardiovascular: Positive for chest pain. Negative for syncope.  Gastrointestinal: Negative for abdominal pain, constipation, diarrhea, nausea and vomiting.  Genitourinary: Negative for dysuria.  Musculoskeletal: Negative for back pain, neck pain and neck stiffness.  Skin: Negative for rash and wound.  Neurological: Negative for dizziness, weakness, light-headedness and headaches.  Psychiatric/Behavioral: Negative for agitation.  All other systems reviewed and are negative.   Physical Exam Updated Vital Signs BP 111/88 (BP Location: Left Arm)   Pulse (!) 122   Temp 98.7 F (37.1 C) (Oral)   Resp (!) 33   Ht 5\' 2"  (1.575 m)   Wt 63.5 kg   SpO2 98%   BMI 25.61 kg/m   Physical Exam Vitals and nursing note reviewed.  Constitutional:      General: She is not in acute distress.    Appearance: She is ill-appearing. She is not toxic-appearing or diaphoretic.  HENT:     Mouth/Throat:     Mouth: Mucous membranes are moist.     Pharynx: No pharyngeal swelling or oropharyngeal exudate.  Eyes:     Extraocular Movements: Extraocular movements intact.     Pupils: Pupils are equal, round, and reactive to light.  Cardiovascular:     Rate and Rhythm: Regular rhythm. Tachycardia present.  Pulmonary:     Effort:  Tachypnea  and respiratory distress present.     Breath sounds: No stridor. Examination of the right-upper field reveals decreased breath sounds. Examination of the right-middle field reveals decreased breath sounds. Examination of the right-lower field reveals decreased breath sounds. Decreased breath sounds and rhonchi present. No wheezing or rales.  Chest:     Chest wall: Tenderness present.  Musculoskeletal:     Cervical back: Normal range of motion.     Right lower leg: No tenderness. No edema.     Left lower leg: No tenderness. No edema.  Skin:    Findings: Rash (chronic rash) present. No erythema.  Neurological:     General: No focal deficit present.     Mental Status: She is alert and oriented to person, place, and time.     Cranial Nerves: No cranial nerve deficit.  Psychiatric:        Mood and Affect: Mood is anxious.     ED Results / Procedures / Treatments   Labs (all labs ordered are listed, but only abnormal results are displayed) Labs Reviewed  I-STAT BETA HCG BLOOD, ED ([facility], [facility], [facility] ONLY) - Abnormal; Notable for the following components:      Result Value   I-stat hCG, quantitative 6.1 (*)    All other components within normal limits  RESPIRATORY PANEL BY RT PCR (FLU A&B, COVID)  LIPASE, BLOOD    EKG EKG Interpretation  Date/Time:  Tuesday March 04 2020 16:39:26 EST Ventricular Rate:  121 PR Interval:    QRS Duration: 78 QT Interval:  360 QTC Calculation: 511 R Axis:   39 Text Interpretation: Sinus tachycardia Multiple ventricular premature complexes Aberrant conduction of SV complex(es) Borderline abnrm T, anterolateral leads Prolonged QT interval When compared to prior, faster rate/ No sTEMI Confirmed by Antony Blackbird [id number]) on 03/04/2020 5:01:59 PM   Radiology DG Chest 2 View  Result Date: 03/04/2020 CLINICAL DATA:  Cough, severe shortness of breath, and tachycardia. Known metastatic breast cancer to lung. EXAM: CHEST - 2 VIEW COMPARISON:  Radiograph  02/07/2020, chest CT 01/31/2020 FINDINGS: Large right pleural effusion which has reaccumulated since recent thoracentesis, only small portion of the right upper lung is aerated. Left chest port remains in place. Interstitial thickening in the left lung which appears new. Heart appears normal in size. No significant left pleural effusion. T1 compression fracture on prior chest CT is not well demonstrated radiographically. No definite new osseous abnormalities. Right mastectomy with surgical clips in the axilla. IMPRESSION: 1. Large right pleural effusion which has increased in size since recent thoracentesis, only small portion of the right upper lung is aerated. 2. New interstitial thickening in the left lung, may be pulmonary edema or atypical infection. Electronically Signed   By: Keith Rake M.D.   On: 03/04/2020 15:33    Procedures Procedures (including critical care time)  Medications Ordered in ED Medications  alteplase (CATHFLO ACTIVASE) injection 2 mg (2 mg Intracatheter Given 03/04/20 1724)    ED Course  I have reviewed the triage vital signs and the nursing notes.  Pertinent labs & imaging results that were available during my care of the patient were reviewed by me and considered in my medical decision making (see chart for details).    MDM Rules/Calculators/A&P                      Terri Estes is a 34 y.o. 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  Patient reports that she has had no fevers or chills but went to her cancer center today for follow-up and was found to have respiratory distress.  She is breathing  approximately 40 times a minute and is tachycardic in the 130s.  She reports has been having shortness of breath and coughing up blood.  She reports having right-sided chest pain is worsening.  She reports it feels slightly different than the malignant pleural effusions he had during last month.  She denies any leg pain or leg swelling, denies nausea, vomiting, urinary symptoms or GI symptoms.  She denies recent Covid symptoms.  She denies any other complaints on arrival.  On my exam, patient is tachypneic and tachycardic.  Patient is maintaining her oxygen saturations in the mid 90s but she is breathing very quickly I am concerned she could be tiring out.  We will start her on some oxygen for comfort and get a Covid test in case she needs BiPAP.  Her lungs had coarseness and were diminished on the right but were present on the left.  No significant wheezing.  Chest was tender diffusely on the right.  Legs nontender nonedematous.  Good pulses in extremities.  Clinically I am concerned about either worsening pleural effusion, infection, or pulmonary embolism.  We will get a CT scan to further evaluate and determine etiology of her symptoms.  Patient had x-ray in the cancer center which showed worsening pleural effusion and cannot rule out infection.  She is afebrile here, will wait for further work-up before antibiotic initiation.  She also had screening blood work at the cancer center showing mild leukocytosis and normal kidney function.  She will get the CT PE study and then will be admitted for the shortness of breath and respiratory distress likely related to pleural effusion.  Care will be transferred to oncoming team while awaiting for results of diagnostic work-up and imaging.  Anticipate admission.   Final Clinical Impression(s) / ED Diagnoses Final diagnoses:  SOB (shortness of breath)  Tachypnea  Tachycardia  Pleural effusion    Clinical Impression: 1. SOB (shortness of breath)   2.  Tachypnea   3. Tachycardia   4. Pleural effusion     Disposition: Care transferred to oncoming team while awaiting for results of PE study on the labs.  Anticipate admission for increased work of breathing with tachycardia, tachypnea, shortness of breath.  This note was prepared with assistance of Systems analyst. Occasional wrong-word or sound-a-like substitutions may have occurred due to the inherent limitations of voice recognition software.      Cadey Bazile, Gwenyth Allegra, MD 03/04/20 [phone]

## 2020-03-05 ENCOUNTER — Other Ambulatory Visit: Payer: Self-pay

## 2020-03-05 ENCOUNTER — Ambulatory Visit: Payer: Medicaid Other

## 2020-03-05 ENCOUNTER — Inpatient Hospital Stay (HOSPITAL_COMMUNITY): Payer: Medicaid Other

## 2020-03-05 ENCOUNTER — Ambulatory Visit
Admission: RE | Admit: 2020-03-05 | Discharge: 2020-03-05 | Disposition: A | Discharge status: Home / Self Care | Payer: Medicaid Other | Source: Ambulatory Visit | Attending: Radiation Oncology | Admitting: Radiation Oncology

## 2020-03-05 DIAGNOSIS — J9600 Acute respiratory failure, unspecified whether with hypoxia or hypercapnia: Secondary | ICD-10-CM

## 2020-03-05 DIAGNOSIS — J90 Pleural effusion, not elsewhere classified: Secondary | ICD-10-CM

## 2020-03-05 DIAGNOSIS — R0682 Tachypnea, not elsewhere classified: Secondary | ICD-10-CM

## 2020-03-05 DIAGNOSIS — J910 Malignant pleural effusion: Secondary | ICD-10-CM

## 2020-03-05 LAB — BASIC METABOLIC PANEL
Anion gap: 9 (ref 5–15)
BUN: 16 mg/dL (ref 6–20)
CO2: 29 mmol/L (ref 22–32)
Calcium: 8.9 mg/dL (ref 8.9–10.3)
Chloride: 97 mmol/L — ABNORMAL LOW (ref 98–111)
Creatinine, Ser: 0.69 mg/dL (ref 0.44–1.00)
GFR calc Af Amer: >60 mL/min (ref >60)
GFR calc non Af Amer: >60 mL/min (ref >60)
Glucose, Bld: 112 mg/dL — ABNORMAL HIGH (ref 70–99)
Potassium: 3.6 mmol/L (ref 3.5–5.1)
Sodium: 135 mmol/L (ref 135–145)

## 2020-03-05 LAB — PROTEIN, PLEURAL OR PERITONEAL FLUID: Total protein, fluid: 4.1 g/dL

## 2020-03-05 LAB — BODY FLUID CELL COUNT WITH DIFFERENTIAL
Eos, Fluid: 1 %
Lymphs, Fluid: 18 %
Monocyte-Macrophage-Serous Fluid: 20 % — ABNORMAL LOW (ref 50–90)
Neutrophil Count, Fluid: 61 % — ABNORMAL HIGH (ref 0–25)
Total Nucleated Cell Count, Fluid: 224 cu mm (ref 0–1000)

## 2020-03-05 LAB — CBC
HCT: 33 % — ABNORMAL LOW (ref 36.0–46.0)
Hemoglobin: 10.3 g/dL — ABNORMAL LOW (ref 12.0–15.0)
MCH: 29.8 pg (ref 26.0–34.0)
MCHC: 31.2 g/dL (ref 30.0–36.0)
MCV: 95.4 fL (ref 80.0–100.0)
Platelets: 151 10*3/uL (ref 150–400)
RBC: 3.46 MIL/uL — ABNORMAL LOW (ref 3.87–5.11)
RDW: 18.6 % — ABNORMAL HIGH (ref 11.5–15.5)
WBC: 10.4 10*3/uL (ref 4.0–10.5)
nRBC: 0 % (ref 0.0–0.2)

## 2020-03-05 LAB — ALBUMIN, PLEURAL OR PERITONEAL FLUID: Albumin, Fluid: 2.2 g/dL

## 2020-03-05 LAB — GLUCOSE, PLEURAL OR PERITONEAL FLUID: Glucose, Fluid: 75 mg/dL

## 2020-03-05 LAB — GRAM STAIN

## 2020-03-05 LAB — HIV ANTIBODY (ROUTINE TESTING W REFLEX): HIV Screen 4th Generation wRfx: NONREACTIVE

## 2020-03-05 LAB — HEPARIN LEVEL (UNFRACTIONATED): Heparin Unfractionated: 0.64 IU/mL (ref 0.30–0.70)

## 2020-03-05 MED ORDER — SODIUM CHLORIDE 0.9% FLUSH
10.0000 mL | Freq: Two times a day (BID) | INTRAVENOUS | Status: DC
  Administered 2020-03-05 – 2020-03-06 (×3): 10 mL

## 2020-03-05 MED ORDER — LIP MEDEX EX OINT
TOPICAL_OINTMENT | CUTANEOUS | Status: DC | PRN
  Filled 2020-03-05: qty 7

## 2020-03-05 MED ORDER — LIDOCAINE HCL 1 % IJ SOLN
INTRAMUSCULAR | Status: AC
  Filled 2020-03-05: qty 20

## 2020-03-05 MED ORDER — ENSURE ENLIVE PO LIQD
237.0000 mL | Freq: Two times a day (BID) | ORAL | Status: DC
  Administered 2020-03-05 – 2020-03-06 (×3): 237 mL via ORAL

## 2020-03-05 MED ORDER — RIVAROXABAN 20 MG PO TABS: 20.0000 mg | ORAL_TABLET | Freq: Every day | ORAL | Status: DC

## 2020-03-05 MED ORDER — BENZONATATE 100 MG PO CAPS
100.0000 mg | ORAL_CAPSULE | Freq: Three times a day (TID) | ORAL | Status: DC
  Administered 2020-03-05 – 2020-03-06 (×4): 100 mg via ORAL
  Filled 2020-03-05 (×4): qty 1

## 2020-03-05 MED ORDER — RIVAROXABAN 15 MG PO TABS
15.0000 mg | ORAL_TABLET | Freq: Two times a day (BID) | ORAL | Status: DC
  Administered 2020-03-05 – 2020-03-06 (×3): 15 mg via ORAL
  Filled 2020-03-05 (×3): qty 1

## 2020-03-05 MED ORDER — RIVAROXABAN (XARELTO) EDUCATION KIT FOR DVT/PE PATIENTS
PACK | Freq: Once | Status: AC
  Filled 2020-03-05: qty 1

## 2020-03-05 MED ORDER — HYDROCODONE-HOMATROPINE 5-1.5 MG/5ML PO SYRP: 5.0000 mL | ORAL_SOLUTION | Freq: Four times a day (QID) | ORAL | Status: DC | PRN

## 2020-03-05 MED ORDER — CHLORHEXIDINE GLUCONATE CLOTH 2 % EX PADS
6.0000 | MEDICATED_PAD | Freq: Every day | CUTANEOUS | Status: DC
  Administered 2020-03-05: 6 via TOPICAL

## 2020-03-05 MED ORDER — LEVALBUTEROL HCL 0.63 MG/3ML IN NEBU
0.6300 mg | INHALATION_SOLUTION | Freq: Four times a day (QID) | RESPIRATORY_TRACT | Status: DC | PRN
  Administered 2020-03-05 – 2020-03-06 (×3): 0.63 mg via RESPIRATORY_TRACT
  Filled 2020-03-05 (×3): qty 3

## 2020-03-05 MED ORDER — SODIUM CHLORIDE 0.9% FLUSH: 10.0000 mL | INTRAVENOUS | Status: DC | PRN

## 2020-03-05 MED ORDER — LORAZEPAM 2 MG/ML IJ SOLN
1.0000 mg | Freq: Once | INTRAMUSCULAR | Status: AC
  Administered 2020-03-05: 1 mg via INTRAVENOUS
  Filled 2020-03-05: qty 1

## 2020-03-05 MED ORDER — GUAIFENESIN-DM 100-10 MG/5ML PO SYRP
5.0000 mL | ORAL_SOLUTION | ORAL | Status: DC | PRN
  Administered 2020-03-05 (×2): 5 mL via ORAL
  Filled 2020-03-05 (×2): qty 10

## 2020-03-05 NOTE — Final Progress Note (Signed)
Writer called to bedside for blood draw per consult. Patient with heparin infusing via port. Patient has PIV. Primary care Rn instructed to move heparin to PIV to make port available for future blood draws and informed that writer contacted lab for am labs.

## 2020-03-05 NOTE — Progress Notes (Deleted)
Red MEWS protocol initiated, NP M. Sharlet Salina aware, RN to use medications ordered by admitting dr.  rapid response notified. No new orders rapid RN.

## 2020-03-05 NOTE — Progress Notes (Signed)
PCCM progress note  Patient already underwent thoracentesis by interventional radiology earlier today. Discussed with Dr. Tana Coast, MD Hospitalist.  We will sign off at this point If effusion recurs then she will be a candidate for Pleurx catheter.  Marshell Garfinkel MD [facility] Please see [email or web address] for pager details.  03/05/2020, 4:24 PM

## 2020-03-05 NOTE — Progress Notes (Signed)
[facility] for heparin >> Xarelto Indication: pulmonary embolus  No Known Allergies  Patient Measurements: Height: 5\' 2"  (157.5 cm) Weight: 140 lb (63.5 kg) IBW/kg (Calculated) : 50.1 Heparin Dosing Weight: 63.5kg  Vital Signs: Temp: 97.7 F (36.5 C) (03/10 0706) Temp Source: Oral (03/10 0706) BP: 95/60 (03/10 1107) Pulse Rate: 117 (03/10 0707)  Labs: Recent Labs    03/04/20 1405 03/04/20 2005 03/05/20 0618  HGB 11.3*  --  10.3*  HCT 35.6*  --  33.0*  PLT 154  --  151  APTT  --  25  --   LABPROT  --  14.8  --   INR  --  1.2  --   HEPARINUNFRC  --   --  0.64  CREATININE 0.69  --  0.69    Estimated Creatinine Clearance: 89.3 mL/min (by C-G formula based on SCr of 0.69 mg/dL).  Assessment: 34 yo F with a significant past medical history of breast cancer comes in with a chief complaint of shortness of breath.  Was seen in the office today and found to be significantly tachypneic with a almost complete whiteout of the right lung.  CTA does show a tiny left PE. Pharmacy to dose heparin, no prior AC noted.  03/05/2020 Heparin off since 0743 AM for thoracentesis.   Thoracentesis 11:15 am. 570 cc bloody fluid. To transition to Xarelto for PE.   HG 10.3, PLTC WNL.    Goal of Therapy:  Heparin level 0.3-0.7 units/ml Monitor platelets by anticoagulation protocol:   Plan:  Start Xarelto 15 mg po bid with food x 21 days then Xarelto 20 mg qsupper Will educate and give 30 day free card prior to discharge.  Eudelia Bunch, Pharm.D [phone] 03/05/2020 12:09 PM

## 2020-03-05 NOTE — Progress Notes (Signed)
ANTICOAGULATION CONSULT NOTE - Follow Up Consult  Pharmacy Consult for Heparin Indication: Pulmonary Embolus  No Known Allergies  Patient Measurements: Height: 5\' 2"  (157.5 cm) Weight: 140 lb (63.5 kg) IBW/kg (Calculated) : 50.1 Heparin Dosing Weight:   Vital Signs: Temp: 97.5 F (36.4 C) (03/10 0521) Temp Source: Oral (03/09 2328) BP: 99/80 (03/10 0521) Pulse Rate: 124 (03/10 0521)  Labs: Recent Labs    03/04/20 1405 03/04/20 2005 03/05/20 0618  HGB 11.3*  --   --   HCT 35.6*  --   --   PLT 154  --   --   APTT  --  25  --   LABPROT  --  14.8  --   INR  --  1.2  --   HEPARINUNFRC  --   --  0.64  CREATININE 0.69  --   --     Estimated Creatinine Clearance: 89.3 mL/min (by C-G formula based on SCr of 0.69 mg/dL).   Medications:  Infusions:  . heparin 1,200 Units/hr (03/04/20 [id number])    Assessment: Patient with heparin level at goal.  No heparin issues noted.  Goal of Therapy:  Heparin level 0.3-0.7 units/ml Monitor platelets by anticoagulation protocol: Yes   Plan:  Continue heparin drip at current rate Recheck level at [address] [place] 03/05/2020,6:54 AM

## 2020-03-05 NOTE — Progress Notes (Signed)
Initial Nutrition Assessment  DOCUMENTATION CODES:   Not applicable  INTERVENTION:  Ensure Enlive po BID, each supplement provides 350 kcal and 20 grams of protein  NUTRITION DIAGNOSIS:   Increased nutrient needs(calories and protein) related to cancer and cancer related treatments as evidenced by estimated needs.   GOAL:   Patient will meet greater than or equal to 90% of their needs   MONITOR:   PO intake, Supplement acceptance, Labs, Weight trends, I & O's  REASON FOR ASSESSMENT:   Malnutrition Screening Tool    ASSESSMENT:  RD working remotely.  34 year old female with past medical history significant of asthma, chronic pain, anxiety, depression, GERD, h/o substance abuse, psoriasis and metastatic breast cancer s/p R mastectomy/axillary lymph node dissection (Oct 2020) who presented for radiation therapy and became short of breath and tachypneic. Patient referred to ED for evaluation, CTA of chest showed right-sided pleural effusion with small left-sided pulmonary embolism.  Patient admitted on 3/09 for acute respiratory failure.  3/10 -[place] diagnostic and therapeutic R thoracentesis yielded 570 ml bloody fluid  Patient diagnosed with T4 N2 MX, stage IIIC invasive ductal carcinoma, grade 3, triple negative, MIB-1 90% in April 2020. She has been receiving chemotherapy treatments, most recently started on capecitabine on 02/13/20; radiation was started on 01/23/20. Patient had R thoracentesis that yielded 800 ml on 2/11 with cytology c/w malignant effusion.    Current wt 139.7 lbs Weight history reviewed, on 11/20 pt wt 80.1 kg (176.22 lbs), on 12/17 pt wt 78.3 kg (172.26 lbs), on 1/07 pt wt 73 kg (160.6 lbs), on 2/08 pt wt 71.6 kg (157.52 lbs), on 2/19 pt wt 65.3 kg (143.66 lbs). Per history, patient has lost 36.52 lbs (20.7%) in the past 4 months which is severe for time frame.   Unable to obtain nutrition history at this time, per chart review, pt currently in radiation  oncology. Given patient history of present illness and weight losses, highly suspect malnutrition, however unable to identify at this time without completion of NFPE and/or dietary recall. Will plan to complete physical assessment at follow-up. Patient is on a regular diet, no recorded meals at this time for review. Will provide Ensure twice daily to aid with estimated needs.  Medications reviewed and include: Decadron, Pepcid, Gabapentin, Xarelto Labs reviewed  NUTRITION - FOCUSED PHYSICAL EXAM: Unable to complete at this time, RD working remotely.  Diet Order:   Diet Order            Diet regular Room service appropriate? Yes; Fluid consistency: Thin  Diet effective now              EDUCATION NEEDS:   Not appropriate for education at this time  Skin:  Skin Assessment: Reviewed RN Assessment  Last BM:  PTA  Height:   Ht Readings from Last 1 Encounters:  03/04/20 5\' 2"  (1.575 m)    Weight:   Wt Readings from Last 1 Encounters:  03/04/20 63.5 kg    Ideal Body Weight:  50 kg  BMI:  Body mass index is 25.61 kg/m.  Estimated Nutritional Needs:   Kcal:  1800-2000  Protein:  90-100  Fluid:  >/= 1.8 L/day   Lajuan Lines, RD, LDN Clinical Nutrition After Hours/Weekend Pager # in [phone]

## 2020-03-05 NOTE — Progress Notes (Signed)
Heparin drip stopped at this time. RN to resume heparin drip after thoracentesis, er Dr. Tana Coast Ripudeep

## 2020-03-05 NOTE — Discharge Instructions (Signed)
Information on my medicine - XARELTO (rivaroxaban)  This medication education was reviewed with me or my healthcare representative as part of my discharge preparation. WHY WAS XARELTO PRESCRIBED FOR YOU? Xarelto was prescribed to treat blood clots that may have been found in the veins of your legs (deep vein thrombosis) or in your lungs (pulmonary embolism) and to reduce the risk of them occurring again.  What do you need to know about Xarelto? The starting dose is one 15 mg tablet taken TWICE daily with food for the FIRST 21 DAYS then after all the 15 mg tablets have been taken  the dose is changed to one 20 mg tablet taken ONCE A DAY with your evening meal.  DO NOT stop taking Xarelto without talking to the health care provider who prescribed the medication.  Refill your prescription for 20 mg tablets before you run out.  After discharge, you should have regular check-up appointments with your healthcare provider that is prescribing your Xarelto.  In the future your dose may need to be changed if your kidney function changes by a significant amount.  What do you do if you miss a dose? If you are taking Xarelto TWICE DAILY and you miss a dose, take it as soon as you remember. You may take two 15 mg tablets (total 30 mg) at the same time then resume your regularly scheduled 15 mg twice daily the next day.  If you are taking Xarelto ONCE DAILY and you miss a dose, take it as soon as you remember on the same day then continue your regularly scheduled once daily regimen the next day. Do not take two doses of Xarelto at the same time.   Important Safety Information Xarelto is a blood thinner medicine that can cause bleeding. You should call your healthcare provider right away if you experience any of the following: ? Bleeding from an injury or your nose that does not stop. ? Unusual colored urine (red or dark brown) or unusual colored stools (red or black). ? Unusual bruising for unknown  reasons. ? A serious fall or if you hit your head (even if there is no bleeding).  Some medicines may interact with Xarelto and might increase your risk of bleeding while on Xarelto. To help avoid this, consult your healthcare provider or pharmacist prior to using any new prescription or non-prescription medications, including herbals, vitamins, non-steroidal anti-inflammatory drugs (NSAIDs) and supplements.  This website has more information on Xarelto: [email or web address]

## 2020-03-05 NOTE — Progress Notes (Addendum)
Dr. Reece Levy paged to inform him of patient HR130  and RR28 O2 sat 99%.    Addendum 03/05/20 04:32 RN spoke with Dr stated to get EKG and monitor patient O2 sat. Will continue to monitor patient

## 2020-03-05 NOTE — Progress Notes (Signed)
   03/04/20 2128  Vitals  Temp 98.5 F (36.9 C)  Temp Source Oral  BP 116/83  MAP (mmHg) 94  BP Location Left Arm  BP Method Automatic  Patient Position (if appropriate) Lying  Pulse Rate (!) 121  Resp (!) 36  Oxygen Therapy  SpO2 100 %  O2 Device Nasal Cannula  O2 Flow Rate (L/min) 2 L/min  MEWS Score  MEWS Temp 0  MEWS Systolic 0  MEWS Pulse 2  MEWS RR 3  MEWS LOC 0  MEWS Score 5  MEWS Score Color Red  MEWS Assessment  Is this an acute change? Yes  Provider Notification  Provider Name/Title NP M. Sharlet Salina  Date Provider Notified 03/04/20  Time Provider Notified 2128  Notification Type Page  Notification Reason Change in status (Pt red memws in ED)  Response No new orders (Use orders in chart placed by Md)  Date of Provider Response 03/04/20  Time of Provider Response 2135  Rapid Response Notification  Name of Rapid Response RN Notified Hans RN  Date Rapid Response Notified 03/04/20  Time Rapid Response Notified 2145  Red MEWS protocol initiated, NP M. Sharlet Salina aware, RN to use medications ordered by admitting dr..  Rapid response RN Gilmer Mor notified. Respiratory called for neb treatment, pt currently on 2 liter O2 sat 100%. Oxycodone given for pain

## 2020-03-05 NOTE — Procedures (Addendum)
Ultrasound-guided diagnostic and therapeutic right thoracentesis performed yielding 570 cc of bloody fluid. No immediate complications. Follow-up chest x-ray pending. The fluid was sent to the lab for preordered studies. EBL none. Due to pt chest discomfort only the above amount of fluid was removed today, however very little free fluid remained in pleural space afterwards.

## 2020-03-05 NOTE — Progress Notes (Signed)
Paged Dr. Hal Hope to inform him of pt EKG results. EKG placed in chart

## 2020-03-05 NOTE — Progress Notes (Signed)
   03/05/20 0304  Vitals  Temp 97.9 F (36.6 C)  BP 113/86  MAP (mmHg) 95  BP Location Left Arm  BP Method Automatic  Patient Position (if appropriate) Lying  Pulse Rate (!) 130  Pulse Rate Source Monitor  Resp (!) 28  Oxygen Therapy  SpO2 99 %  O2 Device Nasal Cannula  O2 Flow Rate (L/min) 2 L/min  MEWS Score  MEWS Temp 0  MEWS Systolic 0  MEWS Pulse 3  MEWS RR 2  MEWS LOC 0  MEWS Score 5  MEWS Score Color Red  MEWS Assessment  Is this an acute change? No  Provider Notification  Provider Name/Title NP M. Sharlet Salina   Date Provider Notified 03/05/20  Time Provider Notified [phone]  Notification Type Page  Notification Reason  (Pt RED MEWS protocol now)  Response No new orders  RN paged NP M. Sharlet Salina informed her of pt RED MEWS. No new orders at that time, but stated that she would page Dr. Hal Hope regarding pt. Pt given guaifenesin as pt HR increases with cough.

## 2020-03-05 NOTE — TOC Initial Note (Signed)
Transition of Care [facility]) - Initial/Assessment Note    Patient Details  Name: Terri Estes MRN: [id number] Date of Birth: 1986-04-06  [facility] CM/SW Contact:    Trish Mage, LCSW Phone Number: 03/05/2020, 2:28 PM  Clinical Narrative:  Responded verbally to Dr request for benefits check on Xarelto v Eliquis.  No further needs identified. TOC will continue to follow during the course of hospitalization.                  Expected Discharge Plan: Home/Self Care Barriers to Discharge: No Barriers Identified   Patient Goals and CMS Choice        Expected Discharge Plan and Services Expected Discharge Plan: Home/Self Care                                              Prior Living Arrangements/Services                       Activities of Daily Living Home Assistive Devices/Equipment: Shower chair with back ADL Screening (condition at time of admission) Patient's cognitive ability adequate to safely complete daily activities?: Yes Is the patient deaf or have difficulty hearing?: No Does the patient have difficulty seeing, even when wearing glasses/contacts?: No Does the patient have difficulty concentrating, remembering, or making decisions?: No Patient able to express need for assistance with ADLs?: Yes Does the patient have difficulty dressing or bathing?: No Independently performs ADLs?: No Communication: Independent Dressing (OT): Independent Grooming: Independent Feeding: Independent Bathing: Independent Toileting: Independent In/Out Bed: Needs assistance Does the patient have difficulty walking or climbing stairs?: Yes Weakness of Legs: Both Weakness of Arms/Hands: None  Permission Sought/Granted                  Emotional Assessment              Admission diagnosis:  Acute respiratory failure (HCC) [J96.00] Tachypnea [R06.82] SOB (shortness of breath) [R06.02] Pleural effusion [J90] Tachycardia  [R00.0] Acute respiratory failure with hypoxia ([place]) [J96.01] Patient Active Problem List   Diagnosis Date Noted  . Tachypnea   . 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 Acute respiratory failure ([place]) 03/04/2020  . Pulmonary embolism ([place]) 03/04/2020  . Acute respiratory failure with hypoxia ([place]) 03/04/2020  . 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 Encounter for antineoplastic immunotherapy 08/22/2019  . 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 Genetic testing 05/14/2019  . Family history of breast cancer   . Malignant neoplasm of overlapping sites of right breast in female, estrogen receptor negative ([place]) 04/25/2019  . Acute kidney injury ([place]) 09/29/2018  . 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 Acute blood loss anemia 09/23/2018  . DIC (disseminated intravascular coagulation) ([place]) 09/23/2018  . S/P cesarean section 09/23/2018  . Inadequate social support 07/21/2018  . Sciatica 07/21/2018  . Abdominal muscle strain, initial encounter 07/21/2018  . History of substance abuse ([place]) 06/12/2018  . Supervision of other normal pregnancy, antepartum 05/10/2018  . Short interval between pregnancies affecting pregnancy, antepartum 05/10/2018  . Hx of preeclampsia, prior pregnancy, currently pregnant 05/10/2018  . History of preterm delivery, currently pregnant 05/10/2018  . Depression affecting pregnancy, antepartum 05/10/2018  . HSIL (high grade squamous intraepithelial lesion) on Pap smear of cervix 12/24/2016  . History of cesarean delivery 12/16/2016   PCP:  Patient,  No Pcp Per Pharmacy:   [facility] [place], [place] - [address] [place] [address] Phone: [phone] Fax: [phone]  [place], [place] - [address] [place] [address] Phone: [phone] Fax: [phone]  [facility]  DRUG STORE [place], [address] [place] [address] Phone: [phone] Fax: [phone]     Social Determinants of Health (SDOH) Interventions    Readmission Risk Interventions No flowsheet data found.

## 2020-03-05 NOTE — Progress Notes (Signed)
[facility]                                                                              Patient Demographics  Terri Estes, is a 34 y.o. female, DOB - 1986-12-20, OO:[id number]  Admit date - 03/04/2020   Admitting Physician Rise Patience, MD  Outpatient Primary MD for the patient is Patient, No Pcp Per  Outpatient specialists:   LOS - 1  days   Medical records reviewed and are as summarized below:    Chief Complaint  Patient presents with  . Shortness of Breath       Brief summary   Terri Estes is a 34 y.o. female with history of metastatic breast cancer who had come in for radiation therapy when patient became short of breath and tachypneic was referred to the ER.  Patient states she had thoracentesis done almost about a month ago and fluid was taken but despite which patient was still short of breath.  Has some pleuritic cough denies any fever chills.  Chest pain mostly pleuritic in nature.In the ER patient had CT angiogram of the chest which showed worsening right-sided pleural effusion with small left-sided pulmonary embolism for which patient was started on heparin infusion.     Assessment & Plan    Principal Problem:   Acute respiratory failure (HCC) with hypoxia secondary to worsening malignant pleural effusion -Heparin drip on hold this morning, diagnostic and therapeutic right-sided thoracentesis done yielding 570 cc, follow studies -If patient has recurrent pleural effusion, will need Pleurx catheter -Added Tessalon Perles and Hycodan for coughing.  Active Problems:  Small left-sided pulmonary embolism (HCC) -Appreciate Dr. Jana Hakim for seeing her today, discussed anticoagulation, recommended NOAC -TOC consulted for co-pays for Xarelto versus Eliquis. -Started on Xarelto after the thoracentesis  Metastatic breast cancer Undergoing chemo XRT, patient scheduled for XRT today -Will likely DC home after XRT tomorrow -Patient  was seen by Dr. Jana Hakim this morning, appreciate assistance   Anemia of chronic disease likely due to malignancy -H&H currently stable  Asthma - currently stable   Code Status:   full code  DVT Prophylaxis:  xarelto Family Communication: Discussed all imaging results, lab results, explained to the patient, discussed on speaker phone    Disposition Plan: Patient from home, will dc home in am, after XRT   Time Spent in minutes 25 mins  Procedures:  Rt thorcaentesis   Consultants:   Pulmonology Oncology  Antimicrobials:   Anti-infectives (From admission, onward)   None          Medications  Scheduled Meds: . benzonatate  100 mg Oral TID  . buprenorphine  8 mg Sublingual BID  . Chlorhexidine Gluconate Cloth  6 each Topical Daily  . dexamethasone  4 mg Oral Q breakfast  . famotidine  20 mg Oral Daily  . feeding supplement (ENSURE ENLIVE)  237 mL Oral BID BM  . gabapentin  300 mg Oral TID  . rivaroxaban   Does not apply Once  . rivaroxaban  15 mg Oral BID WC   Followed by  . [START ON 03/26/2020] rivaroxaban  20 mg Oral Q supper  .  sodium chloride flush  10-40 mL Intracatheter Q12H   Continuous Infusions: PRN Meds:.acetaminophen **OR** acetaminophen, alum & mag hydroxide-simeth, cyclobenzaprine, HYDROcodone-homatropine, levalbuterol, lip balm, ondansetron **OR** ondansetron (ZOFRAN) IV, oxyCODONE, sodium chloride flush      Subjective:   Raja Verhoeven was seen and examined today.  Seen earlier this morning by myself and Dr. Jana Hakim, was complaining of shortness of breath, waiting for thoracentesis. + Shortness of breath.  No abdominal pain nausea or vomiting.  No fevers or chills.  Objective:   Vitals:   03/05/20 1052 03/05/20 1104 03/05/20 1107 03/05/20 1446  BP: 105/80 109/76 95/60 (!) 153/135  Pulse:    (!) 57  Resp:    20  Temp:    98.3 F (36.8 C)  TempSrc:    Oral  SpO2:      Weight:      Height:       No intake or output data in the  24 hours ending 03/05/20 1717   Wt Readings from Last 3 Encounters:  03/04/20 63.5 kg  02/15/20 65.3 kg  02/04/20 71.6 kg     Exam  General: Alert and oriented x 3, NAD  Cardiovascular: S1 S2 auscultated, no murmurs, RRR  Respiratory: Decreased BS on right  Gastrointestinal: Soft, nontender, nondistended, + bowel sounds  Ext: no pedal edema bilaterally  Neuro no neuro deficits  Musculoskeletal: No digital cyanosis, clubbing  Skin: No rashes  Psych: Normal affect and demeanor, alert and oriented x3    Data Reviewed:  I have personally reviewed following labs and imaging studies  Micro Results Recent Results (from the past 240 hour(s))  Respiratory Panel by RT PCR (Flu A&B, Covid) - Nasopharyngeal Swab     Status: None   Collection Time: 03/04/20  4:20 PM   Specimen: Nasopharyngeal Swab  Result Value Ref Range Status   SARS Coronavirus 2 by RT PCR NEGATIVE NEGATIVE Final    Comment: (NOTE) SARS-CoV-2 target nucleic acids are NOT DETECTED. The SARS-CoV-2 RNA is generally detectable in upper respiratoy specimens during the acute phase of infection. The lowest concentration of SARS-CoV-2 viral copies this assay can detect is 131 copies/mL. A negative result does not preclude SARS-Cov-2 infection and should not be used as the sole basis for treatment or other patient management decisions. A negative result may occur with  improper specimen collection/handling, submission of specimen other than nasopharyngeal swab, presence of viral mutation(s) within the areas targeted by this assay, and inadequate number of viral copies (<131 copies/mL). A negative result must be combined with clinical observations, patient history, and epidemiological information. The expected result is Negative. Fact Sheet for Patients:  PinkCheek.be Fact Sheet for Healthcare Providers:  GravelBags.it This test is not yet ap proved or  cleared by the [place] FDA and  has been authorized for detection and/or diagnosis of SARS-CoV-2 by FDA under an Emergency Use Authorization (EUA). This EUA will remain  in effect (meaning this test can be used) for the duration of the COVID-19 declaration under Section 564(b)(1) of the Act, 21 U.S.C. section 360bbb-3(b)(1), unless the authorization is terminated or revoked sooner.    Influenza A by PCR NEGATIVE NEGATIVE Final   Influenza B by PCR NEGATIVE NEGATIVE Final    Comment: (NOTE) The Xpert Xpress SARS-CoV-2/FLU/RSV assay is intended as an aid in  the diagnosis of influenza from Nasopharyngeal swab specimens and  should not be used as a sole basis for treatment. Nasal washings and  aspirates are unacceptable for Xpert Xpress SARS-CoV-2/FLU/RSV  testing. Fact Sheet for Patients: PinkCheek.be Fact Sheet for Healthcare Providers: GravelBags.it This test is not yet approved or cleared by the [place] FDA and  has been authorized for detection and/or diagnosis of SARS-CoV-2 by  FDA under an Emergency Use Authorization (EUA). This EUA will remain  in effect (meaning this test can be used) for the duration of the  Covid-19 declaration under Section 564(b)(1) of the Act, 21  U.S.C. section 360bbb-3(b)(1), unless the authorization is  terminated or revoked. Performed at [facility], [place] [address], [place], [place] [address]     Radiology Reports DG Chest 1 View  Result Date: 03/05/2020 CLINICAL DATA:  Post thoracentesis.  Breast cancer EXAM: CHEST  1 VIEW COMPARISON:  03/04/2020 FINDINGS: Large right pleural effusion has progressed with near complete collapse of the right lung. No pneumothorax. Mild left lower lobe atelectasis or infiltrate. Port-A-Cath tip SVC. IMPRESSION: Progression of large right pleural effusion with collapse of the right lung. No pneumothorax. Electronically Signed   By:  Franchot Gallo M.D.   On: 03/05/2020 11:38   DG Chest 1 View  Result Date: 02/07/2020 CLINICAL DATA:  Status post right thoracentesis EXAM: CHEST  1 VIEW COMPARISON:  CT chest, 01/31/2020, chest radiograph, 10/30/2019 FINDINGS: Left chest port catheter. The heart and mediastinum are unremarkable. Moderate right pleural effusion with associated atelectasis or consolidation. No pneumothorax. Trace left pleural effusion. IMPRESSION: Moderate right pleural effusion with associated atelectasis or consolidation. No pneumothorax. Electronically Signed   By: Eddie Candle M.D.   On: 02/07/2020 15:20   DG Chest 2 View  Result Date: 03/04/2020 CLINICAL DATA:  Cough, severe shortness of breath, and tachycardia. Known metastatic breast cancer to lung. EXAM: CHEST - 2 VIEW COMPARISON:  Radiograph 02/07/2020, chest CT 01/31/2020 FINDINGS: Large right pleural effusion which has reaccumulated since recent thoracentesis, only small portion of the right upper lung is aerated. Left chest port remains in place. Interstitial thickening in the left lung which appears new. Heart appears normal in size. No significant left pleural effusion. T1 compression fracture on prior chest CT is not well demonstrated radiographically. No definite new osseous abnormalities. Right mastectomy with surgical clips in the axilla. IMPRESSION: 1. Large right pleural effusion which has increased in size since recent thoracentesis, only small portion of the right upper lung is aerated. 2. New interstitial thickening in the left lung, may be pulmonary edema or atypical infection. Electronically Signed   By: Keith Rake M.D.   On: 03/04/2020 15:33   CT Angio Chest PE W and/or Wo Contrast  Result Date: 03/04/2020 CLINICAL DATA:  History of carcinoma of the breast with right-sided chest pain and pleural effusion EXAM: CT ANGIOGRAPHY CHEST WITH CONTRAST TECHNIQUE: Multidetector CT imaging of the chest was performed using the standard protocol during  bolus administration of intravenous contrast. Multiplanar CT image reconstructions and MIPs were obtained to evaluate the vascular anatomy. CONTRAST:  20mL OMNIPAQUE IOHEXOL 350 MG/ML SOLN COMPARISON:  Chest x-ray from earlier in the day. FINDINGS: Cardiovascular: Thoracic aorta and its branches are within normal limits. No aneurysmal dilatation or dissection is seen. No cardiac enlargement is noted. No significant coronary artery disease is seen. Pulmonary artery shows poor opacification one of the peripheral lower lobe branches on the left posteriorly consistent with small pulmonary embolus. No other sizable emboli are seen. Mediastinum/Nodes: Thoracic inlet is within normal limits. Increased soft tissue density is noted in the region of the right hilum and subcarinal region consistent with lymphadenopathy and local  mass. This is stable in appearance from the prior exam. The esophagus is within normal limits. Lungs/Pleura: Left lung is well aerated and demonstrates some new patchy ground-glass opacification increased from the prior exam. Some small parenchymal nodules are noted as well with slight increase in size particularly in the left upper lobe on image number 49 of series 10. The nodule measures 4 mm and previously measured approximately 3 mm. Increasing right perihilar density is noted a combination of hilar adenopathy and peripheral consolidation. The lower lobe consolidation is stable although the degree of upper and middle lobe consolidation has increased in the interval from the prior exam. Large pleural effusion is noted increased from the prior study. Some suggestion of pleural enhancement is noted likely representing pleural metastatic disease. Upper Abdomen: Upper abdomen shows increase in size and number of the hepatic metastatic disease. Specifically a caudate lobe lesion which previously measured 2.2 cm in greatest dimension now measures at least 4.3 cm in greatest dimension. Remainder of the  upper abdomen is within normal limits. Musculoskeletal: Healing fracture of the right anterior eighth rib is noted. Compression deformity is noted at T1 increased when compared with the prior exam. This likely represents a pathologic fracture. Changes of prior right mastectomy are noted. Review of the MIP images confirms the above findings. IMPRESSION: Tiny left lower lobe pulmonary embolus. Increase in right-sided pleural effusion with evidence of pleural enhancement suggestive of metastatic deposits. Increase in the degree of right lung consolidation secondary to central adenopathy and likely underlying mass. Increase in size and number of metastatic lesions within the liver as described. Increase in size of nodular changes within the left lung as well as some new ground-glass opacities. Correlation with the pending COVID-19 testing is recommended. Healing fracture of the right anterior eighth rib. Increased compression deformity at T1 likely pathologic in nature. Electronically Signed   By: Inez Catalina M.D.   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 EXAM: ULTRASOUND GUIDED DIAGNOSTIC AND THERAPEUTIC RIGHT THORACENTESIS MEDICATIONS: None COMPLICATIONS: None immediate PROCEDURE: An ultrasound guided thoracentesis was thoroughly discussed with the patient/mother and questions answered. The benefits, risks, alternatives and complications were also discussed. The patient understands and wishes to proceed with the procedure. Written consent was obtained. Ultrasound was performed to localize and mark an adequate pocket of fluid in the right chest. The area was then prepped and draped in the normal sterile fashion. 1% Lidocaine was used for local anesthesia. Under ultrasound guidance a 6  Fr Safe-T-Centesis catheter was introduced. Thoracentesis was performed. The catheter was removed and a dressing applied. FINDINGS: A total of approximately 570 cc of bloody fluid was removed. Samples were sent to the laboratory as requested by the clinical team. IMPRESSION: Successful ultrasound guided diagnostic and therapeutic right thoracentesis yielding 570 cc of pleural fluid. Read by: Rowe Robert, PA-C Electronically Signed   By: Sandi Mariscal M.D.   On: 03/05/2020 11:46   US Thoracentesis Asp Pleural space w/IMG guide  Result Date: 02/07/2020 INDICATION: Patient with history of breast cancer, dyspnea, and right pleural effusion. Request is made for diagnostic and therapeutic right thoracentesis. EXAM: ULTRASOUND GUIDED DIAGNOSTIC AND THERAPEUTIC RIGHT THORACENTESIS MEDICATIONS: 15 mL 1% lidocaine COMPLICATIONS: None immediate. PROCEDURE: An ultrasound guided thoracentesis was thoroughly discussed with the patient and questions answered. The benefits, risks, alternatives and complications  were also discussed. The patient understands and wishes to proceed with the procedure. Written consent was obtained. Ultrasound was performed to localize and mark an adequate pocket of fluid in the right chest. The area was then prepped and draped in the normal sterile fashion. 1% Lidocaine was used for local anesthesia. Under ultrasound guidance a 6 Fr Safe-T-Centesis catheter was introduced by Dr. Laurence Ferrari. Thoracentesis was performed. The catheter was removed and a dressing applied. FINDINGS: A total of approximately 800 mL of hazy gold fluid was removed. Samples were sent to the laboratory as requested by the clinical team. IMPRESSION: Successful ultrasound guided right thoracentesis yielding 800 mL of pleural fluid. Read by: Earley Abide, PA-C Electronically Signed   By: Jacqulynn Cadet M.D.   On: 02/07/2020 16:27    Lab Data:  CBC: Recent Labs  Lab 03/04/20 1405 03/05/20 0618  WBC 13.8* 10.4    NEUTROABS 12.7*  --   HGB 11.3* 10.3*  HCT 35.6* 33.0*  MCV 93.9 95.4  PLT 154 [id number]   Basic Metabolic Panel: Recent Labs  Lab 03/04/20 1405 03/05/20 0618  NA 139 135  K 3.9 3.6  CL 101 97*  CO2 26 29  GLUCOSE 116* 112*  BUN 11 16  CREATININE 0.69 0.69  CALCIUM 9.3 8.9   GFR: Estimated Creatinine Clearance: 89.3 mL/min (by C-G formula based on SCr of 0.69 mg/dL). Liver Function Tests: Recent Labs  Lab 03/04/20 1405  AST 39  ALT 62*  ALKPHOS 188*  BILITOT 0.5  PROT 7.1  ALBUMIN 2.7*   Recent Labs  Lab 03/04/20 1620  LIPASE 15   No results for input(s): AMMONIA in the last 168 hours. Coagulation Profile: Recent Labs  Lab 03/04/20 2005  INR 1.2   Cardiac Enzymes: No results for input(s): CKTOTAL, CKMB, CKMBINDEX, TROPONINI in the last 168 hours. BNP (last 3 results) No results for input(s): PROBNP in the last 8760 hours. HbA1C: No results for input(s): HGBA1C in the last 72 hours. CBG: No results for input(s): GLUCAP in the last 168 hours. Lipid Profile: No results for input(s): CHOL, HDL, LDLCALC, TRIG, CHOLHDL, LDLDIRECT in the last 72 hours. Thyroid Function Tests: Recent Labs    03/04/20 1405  TSH 0.138*   Anemia Panel: No results for input(s): VITAMINB12, FOLATE, FERRITIN, TIBC, IRON, RETICCTPCT in the last 72 hours. Urine analysis:    Component Value Date/Time   COLORURINE YELLOW 11/29/2019 0138   APPEARANCEUR HAZY (A) 11/29/2019 0138   LABSPEC 1.020 11/29/2019 0138   PHURINE 6.0 11/29/2019 0138   GLUCOSEU NEGATIVE 11/29/2019 0138   HGBUR NEGATIVE 11/29/2019 0138   BILIRUBINUR NEGATIVE 11/29/2019 0138   KETONESUR 5 (A) 11/29/2019 0138   PROTEINUR NEGATIVE 11/29/2019 0138   UROBILINOGEN 4.0 (H) 01/13/2018 1417   NITRITE NEGATIVE 11/29/2019 0138   LEUKOCYTESUR MODERATE (A) 11/29/2019 0138     Consepcion Utt M.D. [facility] 03/05/2020, 5:17 PM   Call night coverage person covering after 7pm

## 2020-03-06 ENCOUNTER — Other Ambulatory Visit: Payer: Self-pay | Admitting: Oncology

## 2020-03-06 ENCOUNTER — Ambulatory Visit: Payer: Medicaid Other

## 2020-03-06 ENCOUNTER — Ambulatory Visit
Admission: RE | Admit: 2020-03-06 | Discharge: 2020-03-06 | Disposition: A | Discharge status: Home / Self Care | Payer: Medicaid Other | Source: Ambulatory Visit | Attending: Radiation Oncology | Admitting: Radiation Oncology

## 2020-03-06 DIAGNOSIS — C787 Secondary malignant neoplasm of liver and intrahepatic bile duct: Secondary | ICD-10-CM | POA: Insufficient documentation

## 2020-03-06 DIAGNOSIS — C7800 Secondary malignant neoplasm of unspecified lung: Secondary | ICD-10-CM | POA: Insufficient documentation

## 2020-03-06 LAB — BASIC METABOLIC PANEL
Anion gap: 12 (ref 5–15)
BUN: 14 mg/dL (ref 6–20)
CO2: 28 mmol/L (ref 22–32)
Calcium: 8.9 mg/dL (ref 8.9–10.3)
Chloride: 93 mmol/L — ABNORMAL LOW (ref 98–111)
Creatinine, Ser: 0.5 mg/dL (ref 0.44–1.00)
GFR calc Af Amer: >60 mL/min (ref >60)
GFR calc non Af Amer: >60 mL/min (ref >60)
Glucose, Bld: 109 mg/dL — ABNORMAL HIGH (ref 70–99)
Potassium: 4 mmol/L (ref 3.5–5.1)
Sodium: 133 mmol/L — ABNORMAL LOW (ref 135–145)

## 2020-03-06 LAB — CBC
HCT: 31.6 % — ABNORMAL LOW (ref 36.0–46.0)
Hemoglobin: 10 g/dL — ABNORMAL LOW (ref 12.0–15.0)
MCH: 30 pg (ref 26.0–34.0)
MCHC: 31.6 g/dL (ref 30.0–36.0)
MCV: 94.9 fL (ref 80.0–100.0)
Platelets: 126 10*3/uL — ABNORMAL LOW (ref 150–400)
RBC: 3.33 MIL/uL — ABNORMAL LOW (ref 3.87–5.11)
RDW: 17.6 % — ABNORMAL HIGH (ref 11.5–15.5)
WBC: 12.3 10*3/uL — ABNORMAL HIGH (ref 4.0–10.5)
nRBC: 0 % (ref 0.0–0.2)

## 2020-03-06 MED ORDER — LORAZEPAM 2 MG/ML IJ SOLN
0.5000 mg | Freq: Once | INTRAMUSCULAR | Status: AC
  Administered 2020-03-06: 0.5 mg via INTRAVENOUS
  Filled 2020-03-06: qty 1

## 2020-03-06 MED ORDER — RIVAROXABAN 20 MG PO TABS: 20.0000 mg | ORAL_TABLET | Freq: Every day | ORAL | 3 refills | Status: AC

## 2020-03-06 MED ORDER — RIVAROXABAN (XARELTO) VTE STARTER PACK (15 & 20 MG): ORAL_TABLET | ORAL | 0 refills | Status: AC

## 2020-03-06 MED ORDER — HYDROCODONE-HOMATROPINE 5-1.5 MG/5ML PO SYRP: 5.0000 mL | ORAL_SOLUTION | Freq: Four times a day (QID) | ORAL | 0 refills | Status: AC | PRN

## 2020-03-06 MED ORDER — BENZONATATE 100 MG PO CAPS: 100.0000 mg | ORAL_CAPSULE | Freq: Three times a day (TID) | ORAL | 0 refills | Status: AC | PRN

## 2020-03-06 MED ORDER — CLONAZEPAM 1 MG PO TABS: 1.0000 mg | ORAL_TABLET | Freq: Every evening | ORAL | 0 refills | Status: AC | PRN

## 2020-03-06 MED ORDER — HEPARIN SOD (PORK) LOCK FLUSH 100 UNIT/ML IV SOLN
500.0000 [IU] | INTRAVENOUS | Status: AC | PRN
  Administered 2020-03-06: 500 [IU]
  Filled 2020-03-06: qty 5

## 2020-03-06 MED FILL — HYDROCODONE-HOMATROPINE SYR: 5-1.5 | 6 days supply | Qty: 120 | Fill #0

## 2020-03-06 MED FILL — clonazePAM 1 MG TABS: 1 | 30 days supply | Qty: 30 | Fill #0

## 2020-03-06 MED FILL — XARELTO STARTER PACK: 15 & 20 | 30 days supply | Qty: 51 | Fill #0

## 2020-03-06 MED FILL — BENZONATATE 100 MG CAPS: 100 | 10 days supply | Qty: 30 | Fill #0

## 2020-03-06 NOTE — Plan of Care (Signed)
All discharge instructions were given to Patient. All questions were answered.

## 2020-03-06 NOTE — Progress Notes (Signed)
   03/06/20 1114  Vitals  Temp 97.8 F (36.6 C)  Temp Source Oral  BP 107/86  MAP (mmHg) 94  BP Location Left Arm  BP Method Automatic  Patient Position (if appropriate) Sitting  Pulse Rate (!) 120  Resp 16  Oxygen Therapy  SpO2 97 %  O2 Device Nasal Cannula  O2 Flow Rate (L/min) 20 L/min  Pt's HR is 120, no acute change. Monitoring closely. [place] protocol initiated.

## 2020-03-06 NOTE — Progress Notes (Signed)
Terri Estes   DOB:November 20, 1986   VW#:[id number]   WGN#:[id number]  Subjective:  [name] underwent right thoracentesis yesterday.  They removed just over a half a liter of bloody fluid.  She did not experience a significant improvement but some improvement in her breathing.  She still has a cough and she still has minimal hemoptysis.  She feels ready to go home after she has her radiation treatment today.  Objective: Young African-American woman examined in bed Vitals:   03/06/20 0422 03/06/20 0652  BP: 100/72 106/76  Pulse: (!) 109 (!) 102  Resp: 20 20  Temp: 98.1 F (36.7 C) 98 F (36.7 C)  SpO2: 92% 100%    Body mass index is 25.61 kg/m.  Intake/Output Summary (Last 24 hours) at 03/06/2020 0758 Last data filed at 03/05/2020 1830 Gross per 24 hour  Intake 300 ml  Output --  Net 300 ml    Thoracentesis site intact, no bleeding bruising or erythema.  CBG (last 3)  No results for input(s): GLUCAP in the last 72 hours.   Labs:  Lab Results  Component Value Date   WBC 12.3 (H) 03/06/2020   HGB 10.0 (L) 03/06/2020   HCT 31.6 (L) 03/06/2020   MCV 94.9 03/06/2020   PLT 126 (L) 03/06/2020   NEUTROABS 12.7 (H) 03/04/2020    '@LASTCHEMISTRY' @  Urine Studies No results for input(s): UHGB, CRYS in the last 72 hours.  Invalid input(s): UACOL, UAPR, USPG, UPH, UTP, UGL, UKET, UBIL, UNIT, UROB, ULEU, UEPI, UWBC, Junie Panning [place], [place], [place]  Basic Metabolic Panel: Recent Labs  Lab 03/04/20 1405 03/04/20 1405 03/05/20 0618 03/06/20 0713  NA 139  --  135 133*  K 3.9   < > 3.6 4.0  CL 101  --  97* 93*  CO2 26  --  29 28  GLUCOSE 116*  --  112* 109*  BUN 11  --  16 14  CREATININE 0.69  --  0.69 0.50  CALCIUM 9.3  --  8.9 8.9   < > = values in this interval not displayed.   GFR Estimated Creatinine Clearance: 89.3 mL/min (by C-G formula based on SCr of 0.5 mg/dL). Liver Function Tests: Recent Labs  Lab 03/04/20 1405  AST 39  ALT 62*  ALKPHOS 188*  BILITOT 0.5   PROT 7.1  ALBUMIN 2.7*   Recent Labs  Lab 03/04/20 1620  LIPASE 15   No results for input(s): AMMONIA in the last 168 hours. Coagulation profile Recent Labs  Lab 03/04/20 2005  INR 1.2    CBC: Recent Labs  Lab 03/04/20 1405 03/05/20 0618 03/06/20 0713  WBC 13.8* 10.4 12.3*  NEUTROABS 12.7*  --   --   HGB 11.3* 10.3* 10.0*  HCT 35.6* 33.0* 31.6*  MCV 93.9 95.4 94.9  PLT 154 151 126*   Cardiac Enzymes: No results for input(s): CKTOTAL, CKMB, CKMBINDEX, TROPONINI in the last 168 hours. BNP: Invalid input(s): POCBNP CBG: No results for input(s): GLUCAP in the last 168 hours. D-Dimer No results for input(s): DDIMER in the last 72 hours. Hgb A1c No results for input(s): HGBA1C in the last 72 hours. Lipid Profile No results for input(s): CHOL, HDL, LDLCALC, TRIG, CHOLHDL, LDLDIRECT in the last 72 hours. Thyroid function studies Recent Labs    03/04/20 1405  TSH 0.138*   Anemia work up No results for input(s): VITAMINB12, FOLATE, FERRITIN, TIBC, IRON, RETICCTPCT in the last 72 hours. Microbiology Recent Results (from the past 240 hour(s))  Respiratory Panel by  RT PCR (Flu A&B, Covid) - Nasopharyngeal Swab     Status: None   Collection Time: 03/04/20  4:20 PM   Specimen: Nasopharyngeal Swab  Result Value Ref Range Status   SARS Coronavirus 2 by RT PCR NEGATIVE NEGATIVE Final    Comment: (NOTE) SARS-CoV-2 target nucleic acids are NOT DETECTED. The SARS-CoV-2 RNA is generally detectable in upper respiratoy specimens during the acute phase of infection. The lowest concentration of SARS-CoV-2 viral copies this assay can detect is 131 copies/mL. A negative result does not preclude SARS-Cov-2 infection and should not be used as the sole basis for treatment or other patient management decisions. A negative result may occur with  improper specimen collection/handling, submission of specimen other than nasopharyngeal swab, presence of viral mutation(s) within  the areas targeted by this assay, and inadequate number of viral copies (<131 copies/mL). A negative result must be combined with clinical observations, patient history, and epidemiological information. The expected result is Negative. Fact Sheet for Patients:  PinkCheek.be Fact Sheet for Healthcare Providers:  GravelBags.it This test is not yet ap proved or cleared by the [place] FDA and  has been authorized for detection and/or diagnosis of SARS-CoV-2 by FDA under an Emergency Use Authorization (EUA). This EUA will remain  in effect (meaning this test can be used) for the duration of the COVID-19 declaration under Section 564(b)(1) of the Act, 21 U.S.C. section 360bbb-3(b)(1), unless the authorization is terminated or revoked sooner.    Influenza A by PCR NEGATIVE NEGATIVE Final   Influenza B by PCR NEGATIVE NEGATIVE Final    Comment: (NOTE) The Xpert Xpress SARS-CoV-2/FLU/RSV assay is intended as an aid in  the diagnosis of influenza from Nasopharyngeal swab specimens and  should not be used as a sole basis for treatment. Nasal washings and  aspirates are unacceptable for Xpert Xpress SARS-CoV-2/FLU/RSV  testing. Fact Sheet for Patients: PinkCheek.be Fact Sheet for Healthcare Providers: GravelBags.it This test is not yet approved or cleared by the [place] FDA and  has been authorized for detection and/or diagnosis of SARS-CoV-2 by  FDA under an Emergency Use Authorization (EUA). This EUA will remain  in effect (meaning this test can be used) for the duration of the  Covid-19 declaration under Section 564(b)(1) of the Act, 21  U.S.C. section 360bbb-3(b)(1), unless the authorization is  terminated or revoked. Performed at [facility], [place] [address], [place], [place] [address]   Gram stain     Status: None   Collection Time:  03/05/20 12:23 PM   Specimen: Fluid  Result Value Ref Range Status   Specimen Description FLUID PLEURAL  Final   Special Requests NONE  Final   Gram Stain   Final    RARE WBC PRESENT,BOTH PMN AND MONONUCLEAR NO ORGANISMS SEEN Performed at [facility], [address] [place], [place] [address]    Report Status 03/05/2020 FINAL  Final      Studies:  DG Chest 1 View  Result Date: 03/05/2020 CLINICAL DATA:  Post thoracentesis.  Breast cancer EXAM: CHEST  1 VIEW COMPARISON:  03/04/2020 FINDINGS: Large right pleural effusion has progressed with near complete collapse of the right lung. No pneumothorax. Mild left lower lobe atelectasis or infiltrate. Port-A-Cath tip SVC. IMPRESSION: Progression of large right pleural effusion with collapse of the right lung. No pneumothorax. Electronically Signed   By: Franchot Gallo M.D.   On: 03/05/2020 11:38   DG Chest 2 View  Result Date: 03/04/2020 CLINICAL DATA:  Cough, severe shortness of breath, and tachycardia. Known metastatic breast cancer to lung. EXAM: CHEST - 2 VIEW COMPARISON:  Radiograph 02/07/2020, chest CT 01/31/2020 FINDINGS: Large right pleural effusion which has reaccumulated since recent thoracentesis, only small portion of the right upper lung is aerated. Left chest port remains in place. Interstitial thickening in the left lung which appears new. Heart appears normal in size. No significant left pleural effusion. T1 compression fracture on prior chest CT is not well demonstrated radiographically. No definite new osseous abnormalities. Right mastectomy with surgical clips in the axilla. IMPRESSION: 1. Large right pleural effusion which has increased in size since recent thoracentesis, only small portion of the right upper lung is aerated. 2. New interstitial thickening in the left lung, may be pulmonary edema or atypical infection. Electronically Signed   By: Keith Rake M.D.   On: 03/04/2020 15:33   CT Angio Chest PE W and/or Wo  Contrast  Result Date: 03/04/2020 CLINICAL DATA:  History of carcinoma of the breast with right-sided chest pain and pleural effusion EXAM: CT ANGIOGRAPHY CHEST WITH CONTRAST TECHNIQUE: Multidetector CT imaging of the chest was performed using the standard protocol during bolus administration of intravenous contrast. Multiplanar CT image reconstructions and MIPs were obtained to evaluate the vascular anatomy. CONTRAST:  76m OMNIPAQUE IOHEXOL 350 MG/ML SOLN COMPARISON:  Chest x-ray from earlier in the day. FINDINGS: Cardiovascular: Thoracic aorta and its branches are within normal limits. No aneurysmal dilatation or dissection is seen. No cardiac enlargement is noted. No significant coronary artery disease is seen. Pulmonary artery shows poor opacification one of the peripheral lower lobe branches on the left posteriorly consistent with small pulmonary embolus. No other sizable emboli are seen. Mediastinum/Nodes: Thoracic inlet is within normal limits. Increased soft tissue density is noted in the region of the right hilum and subcarinal region consistent with lymphadenopathy and local mass. This is stable in appearance from the prior exam. The esophagus is within normal limits. Lungs/Pleura: Left lung is well aerated and demonstrates some new patchy ground-glass opacification increased from the prior exam. Some small parenchymal nodules are noted as well with slight increase in size particularly in the left upper lobe on image number 49 of series 10. The nodule measures 4 mm and previously measured approximately 3 mm. Increasing right perihilar density is noted a combination of hilar adenopathy and peripheral consolidation. The lower lobe consolidation is stable although the degree of upper and middle lobe consolidation has increased in the interval from the prior exam. Large pleural effusion is noted increased from the prior study. Some suggestion of pleural enhancement is noted likely representing pleural  metastatic disease. Upper Abdomen: Upper abdomen shows increase in size and number of the hepatic metastatic disease. Specifically a caudate lobe lesion which previously measured 2.2 cm in greatest dimension now measures at least 4.3 cm in greatest dimension. Remainder of the upper abdomen is within normal limits. Musculoskeletal: Healing fracture of the right anterior eighth rib is noted. Compression deformity is noted at T1 increased when compared with the prior exam. This likely represents a pathologic fracture. Changes of prior right mastectomy are noted. Review of the MIP images confirms the above findings. IMPRESSION: Tiny left lower lobe pulmonary embolus. Increase in right-sided pleural effusion with evidence of pleural enhancement suggestive of metastatic deposits. Increase in the degree of right lung consolidation secondary to central adenopathy and likely underlying mass. Increase in size and number of metastatic lesions within the liver as described. Increase in size of nodular changes  within the left lung as well as some new ground-glass opacities. Correlation with the pending COVID-19 testing is recommended. Healing fracture of the right anterior eighth rib. Increased compression deformity at T1 likely pathologic in nature. Electronically Signed   By: Inez Catalina M.D.   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 EXAM: ULTRASOUND GUIDED DIAGNOSTIC AND THERAPEUTIC RIGHT THORACENTESIS MEDICATIONS: None COMPLICATIONS: None immediate PROCEDURE: An ultrasound guided thoracentesis was thoroughly discussed with the patient/mother and questions answered. The benefits, risks, alternatives and complications were also discussed. The patient understands and wishes to proceed with the  procedure. Written consent was obtained. Ultrasound was performed to localize and mark an adequate pocket of fluid in the right chest. The area was then prepped and draped in the normal sterile fashion. 1% Lidocaine was used for local anesthesia. Under ultrasound guidance a 6 Fr Safe-T-Centesis catheter was introduced. Thoracentesis was performed. The catheter was removed and a dressing applied. FINDINGS: A total of approximately 570 cc of bloody fluid was removed. Samples were sent to the laboratory as requested by the clinical team. IMPRESSION: Successful ultrasound guided diagnostic and therapeutic right thoracentesis yielding 570 cc of pleural fluid. Read by: Rowe Robert, PA-C Electronically Signed   By: Sandi Mariscal M.D.   On: 03/05/2020 11:46    Assessment: 34 y.o. [place]. [place] woman status post right breast biopsy x2 and right axillary lymph node biopsy 04/19/2019 for a clinical T4 N2 M1, stage IV invasive ductal carcinoma, grade 3, triple negative, with an MIB-1 of 90%             (a) chest CT scan 04/11/2019 shows multiple subcentimeter lung nodules             (b) PET scan denied by insurance             (c) CT A/P and bone scan due 05/09/2019 show a very small low-density lesion in the lateral segment of the left liver which is indeterminate, and sclerosis of the right SI joint, which is felt to be likely degenerative; bone scan was entirely negative.             (c) baseline breast MRI 04/30/2019 shows involvement of all 4 quadrants in the right breast as well as right axillary subpectoral and internal mammary lymphadenopathy.  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 (1) neoadjuvant chemotherapy consisting of             (a) pembrolizumab started 05/03/2019, third dose delayed because of elevated liver function tests, resumed 08/02/2019             (b) paclitaxel weekly x12 with carboplatin every 21 days x 4, started  05/08/2019                         (i) tolerated carboplatin AUC 5-day 1 poorly, switched to AUC 2 on day 1 day 8                         (ii) cycle 2 delayed 1 week because of elevated liver function tests                         (iii)  carboplatin and paclitaxel discontinued after 07/03/2019 because of neuropathy, (received 7 weekly doses paclitaxel and one AUC5 carbo dose, four AUC2 doses)             (c) cyclophosphamide and doxorubicin x4 started 07/03/2019, completed 08/29/2019                         (i) 2d dose given day 28 due to poor tolerance; other doses Q 21 days              (2) s/p right mastectomy and ALND 10/16/2019 showing a residual ypT2 ypN2 invasive ductal carcinoma, grade 3, with negative margins             (a) a total of 10 lymph nodes were removed, 4 with macrometastases, 1 with a micrometastasis  (3) adjuvant radiation ongoing  (4) genetics testing             (a) genetic testing 05/14/2019 through the Common Hereditary Gene Panel offered by [facility] found no deleterious mutations in APC, ATM, AXIN2, BARD1, BMPR1A, BRCA1, BRCA2, BRIP1, CDH1, CDK4, CDKN2A (p14ARF), CDKN2A (p16INK4a), CHEK2, CTNNA1, DICER1, EPCAM (Deletion/duplication testing only), GREM1 (promoter region deletion/duplication testing only), KIT, MEN1, MLH1, MSH2, MSH3, MSH6, MUTYH, NBN, NF1, NHTL1, PALB2, PDGFRA, PMS2, POLD1, POLE, PTEN, RAD50, RAD51C, RAD51D, RNF43, SDHB, SDHC, SDHD, SMAD4, SMARCA4. STK11, TP53, TSC1, TSC2, and VHL. The following genes were evaluated for sequence changes only: SDHA and HOXB13 c.251G>A variant only  (5) goserelin started 05/08/2019  (6) pembrolizumab started 08/02/2019, discontinued after fourth dose (10/02/2019) with psoriasis flare  (7) psoriasis:             (a) weekly methotrexate started 11/10/2019, held after 11/17/2019 dose so as not to overlap with radiation             (b) intravenous cyclophosphamide 1 g every 3 weeks starting 11/21/2019, last dose  01/24/2020  (8) RESTAGING STUDIES:             (a) CT chest 01/31/2020 shows progression in her lung and liver lesions, confirming that findings on initial scans April 2020 represented metastatic disease; right pleural effusion, mediastinal adenopathy, T1 wedge deformity             (b) brain MRI pending             (c) right thoracentesis 02/07/2020 cytologically positive for triple negative GATA-3 positive cells  (d) repeat thoracentesis 03/05/2020  (9) started capecitabine with palliative radiation to chest wall 02/13/2020  (10) to start sacituzumab govitecan once radiation completed               Plan:  [name] thinks she has had some improvement in her breathing although this is not terribly marked.  We are going to repeat a chest x-ray when she returns to see [place] March 11, 2020.  Of course she is at the clinic on a daily basis for radiation and she will come up to our side of the building if she has any unusual symptoms.  She will continue radiation as before with the treatment at 1 PM today and she plans to go home after that treatment as per hospitalists' discretion  She is still having minimal hemoptysis.  If this worsens I would drop the dose of Xarelto to daily but right now I am not uncomfortable continuing on 15 mg twice a day and we will follow as outpatient  I greatly appreciate your help to this patient and  her family!  Chauncey Cruel, MD 03/06/2020  7:58 AM Medical Oncology and Hematology [facility] [address] [place], [place] [address] Tel. [phone]    Fax. [phone]

## 2020-03-06 NOTE — Progress Notes (Signed)
  Patient Saturations on Room Air at Rest = 94%  Patient Saturations on [employer] while Ambulating = 93%  Patient Saturations on 2 Liters of oxygen while Ambulating = 98%

## 2020-03-06 NOTE — Progress Notes (Signed)
DISCONTINUE ON PATHWAY REGIMEN - Breast     A cycle is every 21 days (cycles 1-4):     Paclitaxel      Carboplatin    A cycle is every 14 days (cycles 5-8):     Doxorubicin      Cyclophosphamide      [id number]   **Always confirm dose/schedule in your pharmacy ordering system**  REASON: Disease Progression PRIOR TREATMENT: [id number]:  Paclitaxel 80 mg/m2 Weekly + Carboplatin AUC=6 q21 Days x 12 Weeks, Followed by Dose-Dense AC [Doxorubicin + Cyclophosphamide q14 Days x 4 Cycles] TREATMENT RESPONSE: Progressive Disease (PD)  START ON PATHWAY REGIMEN - Breast     A cycle is every 21 days:     Sacituzumab govitecan-hziy   **Always confirm dose/schedule in your pharmacy ordering system**  Patient Characteristics: Distant Metastases or Locoregional Recurrent Disease - Unresected or Locally Advanced Unresectable Disease Progressing after Neoadjuvant and Local Therapies, HER2 Negative/Unknown/Equivocal, ER Negative/Unknown, Chemotherapy, Third Line and Beyond, Prior  or Contraindicated Anthracycline and Prior or Contraindicated Eribulin Therapeutic Status: Distant Metastases BRCA Mutation Status: Absent ER Status: Negative (-) HER2 Status: Negative (-) PR Status: Negative (-) Line of Therapy: Third Line and Beyond Intent of Therapy: Curative Intent, Not Discussed with Patient

## 2020-03-06 NOTE — Discharge Summary (Signed)
Physician Discharge Summary   Patient ID: Terri Estes MRN: [id number] DOB/AGE: 01/02/1986 34 y.o.  Admit date: 03/04/2020 Discharge date: 03/06/2020  Primary Care Physician:  Patient, No Pcp Per   Recommendations for Outpatient Follow-up:  1. Follow up with PCP in 1-2 weeks 2. Patient started on xarelto for pulmonary embolism, 15mg  BID x  21 days, then 20mg  daily  3. Right sided Thoracentesis done on 3/10, if recurs, will need to place pleurx drainage catheter   Home Health: at baseline  Equipment/Devices:   Discharge Condition: stable CODE STATUS: FULL Diet recommendation: heart healthy     Discharge Diagnoses:    . Acute respiratory failure ([facility]) . Malignant right pleural effusion . small left sided Pulmonary embolism ([place]) . 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   albuterol 108 (90 Base) MCG/ACT inhaler Commonly known as: VENTOLIN HFA Inhale 2 puffs into the lungs 4 (four) times daily.   benzonatate 100 MG capsule Commonly known as: TESSALON Take 1 capsule (100 mg total) by mouth 3 (three) times daily as needed for cough.   buprenorphine 8 MG Subl SL tablet Commonly known as: SUBUTEX Place 8 mg under the tongue 2 (two) times daily.   capecitabine 500 MG tablet Commonly known as: XELODA Take 2 tablets (1,000 mg total) by mouth 2 (two) times daily after a meal. Take Monday-Friday. Take only on days of radiation.    cholestyramine 4 g packet Commonly known as: Questran Take 1 packet (4 g total) by mouth 3 (three) times daily with meals. What changed:   when to take this  reasons to take this   clonazePAM 1 MG tablet Commonly known as: KlonoPIN Take 1 tablet (1 mg total) by mouth at bedtime as needed for anxiety.   cyclobenzaprine 10 MG tablet Commonly known as: FLEXERIL Take 1 tablet (10 mg total) by mouth 3 (three) times daily as needed for muscle spasms.   dexamethasone 4 MG tablet Commonly known as: DECADRON Take 1 tablet (4 mg total) by mouth daily with breakfast.   gabapentin 300 MG capsule Commonly known as: NEURONTIN TAKE 1 CAPSULE(300 MG) BY MOUTH THREE TIMES DAILY What changed: See the new instructions.   guaiFENesin 200 MG tablet Take 200 mg by mouth every 4 (four) hours as needed for cough or to loosen phlegm.   HYDROcodone-homatropine 5-1.5 MG/5ML syrup Commonly known as: HYCODAN Take 5 mLs by mouth every 6 (six) hours as needed for cough.   metoCLOPramide 10 MG tablet Commonly known as: REGLAN Take 1 tablet (10 mg total) by mouth every 8 (eight) hours as needed for nausea.   ondansetron 8 MG tablet Commonly known as: ZOFRAN Take 1 tablet (8 mg total) by mouth every 8 (eight) hours as needed for nausea or vomiting.   oxyCODONE 5 MG immediate release tablet Commonly known as: Oxy IR/ROXICODONE Take 1 tablet (5 mg total) by mouth every 6 (six)  hours as needed for severe pain.   PEPCID PO Take 1 tablet by mouth daily.   Rivaroxaban 15 & 20 MG Tbpk Follow package directions: Take one 15mg  tablet by mouth twice a day. On day 22 (03/26/2020), switch to one 20mg  tablet once a day. Take with food.   rivaroxaban 20 MG Tabs tablet Commonly known as: XARELTO Take 1 tablet (20 mg total) by mouth daily with supper. Please start after the starter pack has been completed Start taking on: March 26, 2020   valACYclovir 1000 MG tablet Commonly known as: VALTREX Take 1 tablet  (1,000 mg total) by mouth 2 (two) times daily. What changed:   when to take this  reasons to take this        Brief H and P: For complete details please refer to admission H and P, but in brief [place] a 34 y.o.femalewithhistory of metastatic breast cancer who had come in for radiation therapy when patient became short of breath and tachypneic was referred to the ER. Patient states she had thoracentesis done almost about a month ago and fluid was taken but despite which patient was still short of breath. Has some pleuritic cough denies any fever chills. Chest pain mostly pleuritic in nature.In the ER patient had CT angiogram of the chest which showed worsening right-sided pleural effusion with small left-sided pulmonary embolism for which patient was started on heparin infusion.   Hospital Course:  Acute respiratory failure (HCC) with hypoxia secondary to worsening malignant pleural effusion - [place] diagnostic and therapeutic right-sided thoracentesis was done on 03/05/20 yielding 570 cc, follow studies, likely malignant  -If patient has recurrent pleural effusion, will need Pleurx catheter -Added Tessalon Perles and Hycodan for coughing.   Small left-sided pulmonary embolism (HCC) -Appreciate Dr. Jana Hakim for seeing her, discussed anticoagulation, recommended NOAC, patient in agreement with NOAC;s  -case management was consulted for co-pays for Xarelto versus Eliquis. -Started on Xarelto after the thoracentesis. Patient was provided with education and 30day free card   Metastatic breast cancer Undergoing chemo XRT, patient continued XRT per her schedule  -Patient was seen by Dr. Jana Hakim   Anemia of chronic disease likely due to malignancy -H&H currently stable, 10.0   Asthma - currently stable  Anxiety - patient reported significant anxiety with procedures and night. Placed on PRN Klonopin nightly  Day of Discharge S: doing better after thoracentesis,  patient to have XRT today. No acute issues, dyspnea improving.     BP 107/86 (BP Location: Left Arm)   Pulse (!) 130   Temp 97.8 F (36.6 C) (Oral)   Resp 16   Ht 5\' 2"  (1.575 m)   Wt 63.5 kg   SpO2 93% Comment: During ambulation  BMI 25.61 kg/m   Physical Exam: General: Alert and awake oriented x3 not in any acute distress. HEENT: anicteric sclera, pupils reactive to light and accommodation CVS: S1-S2 clear no murmur rubs or gallops Chest: dec BS at bases Rt>Lt Abdomen: soft nontender, nondistended, normal bowel sounds Extremities: no cyanosis, clubbing or edema noted bilaterally Neuro: Cranial nerves II-XII intact, no focal neurological deficits    Get Medicines reviewed and adjusted: Please take all your medications with you for your next visit with your Primary MD  Please request your Primary MD to go over all hospital tests and procedure/radiological results at the follow up. Please ask your Primary MD to get all Hospital records sent to his/her office.  If you experience worsening of your admission symptoms, develop  shortness of breath, life threatening emergency, suicidal or homicidal thoughts you must seek medical attention immediately by calling 911 or calling your MD immediately  if symptoms less severe.  You must read complete instructions/literature along with all the possible adverse reactions/side effects for all the Medicines you take and that have been prescribed to you. Take any new Medicines after you have completely understood and accept all the possible adverse reactions/side effects.   Do not drive when taking pain medications.   Do not take more than prescribed Pain, Sleep and Anxiety Medications  Special Instructions: If you have smoked or chewed Tobacco  in the last 2 yrs please stop smoking, stop any regular Alcohol  and or any Recreational drug use.  Wear Seat belts while driving.  Please note  You were cared for by a hospitalist during your  hospital stay. Once you are discharged, your primary care physician will handle any further medical issues. Please note that NO REFILLS for any discharge medications will be authorized once you are discharged, as it is imperative that you return to your primary care physician (or establish a relationship with a primary care physician if you do not have one) for your aftercare needs so that they can reassess your need for medications and monitor your lab values.   The results of significant diagnostics from this hospitalization (including imaging, microbiology, ancillary and laboratory) are listed below for reference.      Procedures/Studies:  DG Chest 1 View  Result Date: 03/05/2020 CLINICAL DATA:  Post thoracentesis.  Breast cancer EXAM: CHEST  1 VIEW COMPARISON:  03/04/2020 FINDINGS: Large right pleural effusion has progressed with near complete collapse of the right lung. No pneumothorax. Mild left lower lobe atelectasis or infiltrate. Port-A-Cath tip SVC. IMPRESSION: Progression of large right pleural effusion with collapse of the right lung. No pneumothorax. Electronically Signed   By: Franchot Gallo M.D.   On: 03/05/2020 11:38   DG Chest 1 View  Result Date: 02/07/2020 CLINICAL DATA:  Status post right thoracentesis EXAM: CHEST  1 VIEW COMPARISON:  CT chest, 01/31/2020, chest radiograph, 10/30/2019 FINDINGS: Left chest port catheter. The heart and mediastinum are unremarkable. Moderate right pleural effusion with associated atelectasis or consolidation. No pneumothorax. Trace left pleural effusion. IMPRESSION: Moderate right pleural effusion with associated atelectasis or consolidation. No pneumothorax. Electronically Signed   By: Eddie Candle M.D.   On: 02/07/2020 15:20   DG Chest 2 View  Result Date: 03/04/2020 CLINICAL DATA:  Cough, severe shortness of breath, and tachycardia. Known metastatic breast cancer to lung. EXAM: CHEST - 2 VIEW COMPARISON:  Radiograph 02/07/2020, chest CT  01/31/2020 FINDINGS: Large right pleural effusion which has reaccumulated since recent thoracentesis, only small portion of the right upper lung is aerated. Left chest port remains in place. Interstitial thickening in the left lung which appears new. Heart appears normal in size. No significant left pleural effusion. T1 compression fracture on prior chest CT is not well demonstrated radiographically. No definite new osseous abnormalities. Right mastectomy with surgical clips in the axilla. IMPRESSION: 1. Large right pleural effusion which has increased in size since recent thoracentesis, only small portion of the right upper lung is aerated. 2. New interstitial thickening in the left lung, may be pulmonary edema or atypical infection. Electronically Signed   By: Keith Rake M.D.   On: 03/04/2020 15:33   CT Angio Chest PE W and/or Wo Contrast  Result Date: 03/04/2020 CLINICAL DATA:  History of carcinoma of the breast with right-sided  chest pain and pleural effusion EXAM: CT ANGIOGRAPHY CHEST WITH CONTRAST TECHNIQUE: Multidetector CT imaging of the chest was performed using the standard protocol during bolus administration of intravenous contrast. Multiplanar CT image reconstructions and MIPs were obtained to evaluate the vascular anatomy. CONTRAST:  29mL OMNIPAQUE IOHEXOL 350 MG/ML SOLN COMPARISON:  Chest x-ray from earlier in the day. FINDINGS: Cardiovascular: Thoracic aorta and its branches are within normal limits. No aneurysmal dilatation or dissection is seen. No cardiac enlargement is noted. No significant coronary artery disease is seen. Pulmonary artery shows poor opacification one of the peripheral lower lobe branches on the left posteriorly consistent with small pulmonary embolus. No other sizable emboli are seen. Mediastinum/Nodes: Thoracic inlet is within normal limits. Increased soft tissue density is noted in the region of the right hilum and subcarinal region consistent with lymphadenopathy and  local mass. This is stable in appearance from the prior exam. The esophagus is within normal limits. Lungs/Pleura: Left lung is well aerated and demonstrates some new patchy ground-glass opacification increased from the prior exam. Some small parenchymal nodules are noted as well with slight increase in size particularly in the left upper lobe on image number 49 of series 10. The nodule measures 4 mm and previously measured approximately 3 mm. Increasing right perihilar density is noted a combination of hilar adenopathy and peripheral consolidation. The lower lobe consolidation is stable although the degree of upper and middle lobe consolidation has increased in the interval from the prior exam. Large pleural effusion is noted increased from the prior study. Some suggestion of pleural enhancement is noted likely representing pleural metastatic disease. Upper Abdomen: Upper abdomen shows increase in size and number of the hepatic metastatic disease. Specifically a caudate lobe lesion which previously measured 2.2 cm in greatest dimension now measures at least 4.3 cm in greatest dimension. Remainder of the upper abdomen is within normal limits. Musculoskeletal: Healing fracture of the right anterior eighth rib is noted. Compression deformity is noted at T1 increased when compared with the prior exam. This likely represents a pathologic fracture. Changes of prior right mastectomy are noted. Review of the MIP images confirms the above findings. IMPRESSION: Tiny left lower lobe pulmonary embolus. Increase in right-sided pleural effusion with evidence of pleural enhancement suggestive of metastatic deposits. Increase in the degree of right lung consolidation secondary to central adenopathy and likely underlying mass. Increase in size and number of metastatic lesions within the liver as described. Increase in size of nodular changes within the left lung as well as some new ground-glass opacities. Correlation with the  pending COVID-19 testing is recommended. Healing fracture of the right anterior eighth rib. Increased compression deformity at T1 likely pathologic in nature. Electronically Signed   By: Inez Catalina M.D.   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 EXAM: ULTRASOUND GUIDED DIAGNOSTIC AND THERAPEUTIC RIGHT THORACENTESIS MEDICATIONS: None COMPLICATIONS: None immediate PROCEDURE: An ultrasound guided thoracentesis was thoroughly discussed with the patient/mother and questions answered. The benefits, risks, alternatives and complications were also discussed. The patient understands and wishes to proceed with the procedure. Written consent was obtained. Ultrasound was performed to localize and mark an adequate pocket of fluid in the right chest. The area was then prepped and draped in the normal sterile fashion. 1% Lidocaine was used for local anesthesia. Under ultrasound guidance a  6 Fr Safe-T-Centesis catheter was introduced. Thoracentesis was performed. The catheter was removed and a dressing applied. FINDINGS: A total of approximately 570 cc of bloody fluid was removed. Samples were sent to the laboratory as requested by the clinical team. IMPRESSION: Successful ultrasound guided diagnostic and therapeutic right thoracentesis yielding 570 cc of pleural fluid. Read by: Rowe Robert, PA-C Electronically Signed   By: Sandi Mariscal M.D.   On: 03/05/2020 11:46   US Thoracentesis Asp Pleural space w/IMG guide  Result Date: 02/07/2020 INDICATION: Patient with history of breast cancer, dyspnea, and right pleural effusion. Request is made for diagnostic and therapeutic right thoracentesis. EXAM: ULTRASOUND GUIDED DIAGNOSTIC AND THERAPEUTIC RIGHT THORACENTESIS MEDICATIONS: 15 mL 1% lidocaine  COMPLICATIONS: None immediate. PROCEDURE: An ultrasound guided thoracentesis was thoroughly discussed with the patient and questions answered. The benefits, risks, alternatives and complications were also discussed. The patient understands and wishes to proceed with the procedure. Written consent was obtained. Ultrasound was performed to localize and mark an adequate pocket of fluid in the right chest. The area was then prepped and draped in the normal sterile fashion. 1% Lidocaine was used for local anesthesia. Under ultrasound guidance a 6 Fr Safe-T-Centesis catheter was introduced by Dr. Laurence Ferrari. Thoracentesis was performed. The catheter was removed and a dressing applied. FINDINGS: A total of approximately 800 mL of hazy gold fluid was removed. Samples were sent to the laboratory as requested by the clinical team. IMPRESSION: Successful ultrasound guided right thoracentesis yielding 800 mL of pleural fluid. Read by: Earley Abide, PA-C Electronically Signed   By: Jacqulynn Cadet M.D.   On: 02/07/2020 16:27      LAB RESULTS: Basic Metabolic Panel: Recent Labs  Lab 03/05/20 0618 03/06/20 0713  NA 135 133*  K 3.6 4.0  CL 97* 93*  CO2 29 28  GLUCOSE 112* 109*  BUN 16 14  CREATININE 0.69 0.50  CALCIUM 8.9 8.9   Liver Function Tests: Recent Labs  Lab 03/04/20 1405  AST 39  ALT 62*  ALKPHOS 188*  BILITOT 0.5  PROT 7.1  ALBUMIN 2.7*   Recent Labs  Lab 03/04/20 1620  LIPASE 15   No results for input(s): AMMONIA in the last 168 hours. CBC: Recent Labs  Lab 03/04/20 1405 03/04/20 1405 03/05/20 0618 03/05/20 0618 03/06/20 0713  WBC 13.8*   < > 10.4  --  12.3*  NEUTROABS 12.7*  --   --   --   --   HGB 11.3*   < > 10.3*  --  10.0*  HCT 35.6*   < > 33.0*  --  31.6*  MCV 93.9   < > 95.4   < > 94.9  PLT 154   < > 151  --  126*   < > = values in this interval not displayed.   Cardiac Enzymes: No results for input(s): CKTOTAL, CKMB, CKMBINDEX, TROPONINI in the last 168  hours. BNP: Invalid input(s): POCBNP CBG: No results for input(s): GLUCAP in the last 168 hours.     Disposition and Follow-up: Discharge Instructions    Diet - low sodium heart healthy   Complete by: As directed    Discharge instructions   Complete by: As directed    Please continue taking the Xarelto starter pack, 15 mg tablet twice a day for 21 days.  On day #22 (03/26/2020), switch to 20 mg tablet once a day.   Increase activity slowly   Complete by: As directed  DISPOSITION: home after XRT   [place], MD. Schedule an appointment as soon as possible for a visit in 1 week(s).   Specialty: Oncology Contact information: [place] [address] [phone]            Time coordinating discharge:  18mins   Signed:   Estill Cotta M.D. [facility] 03/06/2020, 12:42 PM

## 2020-03-06 NOTE — Progress Notes (Signed)
I have updated [name] is assessment summary, below, and this is what should be used in today's note.    Assessment: 34 y.o. Terri Estes. [place] woman status post right breast biopsy x2 and right axillary lymph node biopsy 04/19/2019 for a clinical T4 N2 M1, stage IVinvasive ductal carcinoma, grade 3, triple negative, with an MIB-1 of 90% (a) chest CT scan 04/11/2019 shows multiple subcentimeter lung nodules (b) PET scan denied by insurance (c) CT A/P and bone scan due 05/09/2019 show a very small low-density lesion in the lateral segment of the left liver which is indeterminate, and sclerosis of the right SI joint, which is felt to be likely degenerative; bone scan was entirely negative. (c) baseline breast MRI 04/30/2019 shows involvement of all 4 quadrants in the right breast as well as right axillary subpectoral and internal mammary lymphadenopathy. 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 (1) neoadjuvant chemotherapy consisting of (a) pembrolizumab started 05/03/2019, third dose delayed because of elevated liver function tests, resumed 08/02/2019 (b) paclitaxel weekly x12 with carboplatin every 21 days x 4, started 05/08/2019 (i) tolerated carboplatin AUC 5-day 1 poorly, switched to AUC 2 on day 1 day 8 (ii) cycle 2 delayed 1 week because of elevated liver function tests (iii) carboplatin and paclitaxel discontinued after 07/03/2019 because of neuropathy, (received 7 weekly doses paclitaxel and one AUC5 carbo dose, four AUC2 doses) (c) cyclophosphamide and doxorubicin x4 started 07/03/2019, completed 08/29/2019 (i) 2d dose given day 28 due to poor tolerance; other doses Q 21 days  (2) s/p right mastectomy and ALND 10/16/2019 showing a  residual ypT2 ypN2invasive ductal carcinoma, grade 3, with negative margins (a) a total of 10 lymph nodes were removed, 4 with macrometastases, 1 with a micrometastasis  (3) adjuvant radiation ongoing  (4) genetics testing (a) genetic testing 05/14/2019 through the Common Hereditary Gene Panel offered by [facility] found no deleterious mutationsin APC, ATM, AXIN2, BARD1, BMPR1A, BRCA1, BRCA2, BRIP1, CDH1, CDK4, CDKN2A (p14ARF), CDKN2A (p16INK4a), CHEK2, CTNNA1, DICER1, EPCAM (Deletion/duplication testing only), GREM1 (promoter region deletion/duplication testing only), KIT, MEN1, MLH1, MSH2, MSH3, MSH6, MUTYH, NBN, NF1, NHTL1, PALB2, PDGFRA, PMS2, POLD1, POLE, PTEN, RAD50, RAD51C, RAD51D, RNF43, SDHB, SDHC, SDHD, SMAD4, SMARCA4. STK11, TP53, TSC1, TSC2, and VHL. The following genes were evaluated for sequence changes only: SDHA and HOXB13 c.251G>A variant only  (5) goserelinstarted 05/08/2019  (6) pembrolizumabstarted 08/02/2019, discontinued after fourth dose (10/02/2019) with psoriasis flare  (7) psoriasis: (a) weekly methotrexate started 11/10/2019, held after 11/17/2019 dose so as not to overlap with radiation (b) intravenous cyclophosphamide 1 g every 3 weeks starting 11/21/2019, last dose 01/24/2020  (8) RESTAGING STUDIES: (a) CT chest 01/31/2020 shows progression in her lung and liver lesions, confirming that findings on initial scans April 2020 represented metastatic disease; right pleural effusion, mediastinal adenopathy, T1 wedge deformity (b) brain MRI pending (c)right thoracentesis 02/07/2020 cytologically positive for triple negative GATA-3 positive cells            (d) repeat thoracentesis 03/05/2020  (9) startedcapecitabine with palliative radiation to chest wall02/17/2021  (10) to start sacituzumab govitecan once radiation completed

## 2020-03-06 NOTE — Progress Notes (Signed)
[facility] Therapy Treatment Record Phone [phone]   Radiation Therapy was administered to [place] on: 03/06/2020  1:14 PM and was treatment # 28 out of a planned course of 28 treatments.  Radiation Treatment  1). Beam photons with 6-10 energy  2). Brachytherapy None  3). Stereotactic Radiosurgery None  4). Other Radiation None     Latina Craver, RT (T)

## 2020-03-07 ENCOUNTER — Ambulatory Visit: Payer: Medicaid Other

## 2020-03-07 ENCOUNTER — Ambulatory Visit
Admission: RE | Admit: 2020-03-07 | Discharge: 2020-03-07 | Disposition: A | Discharge status: Home / Self Care | Payer: Medicaid Other | Source: Ambulatory Visit | Attending: Radiation Oncology | Admitting: Radiation Oncology

## 2020-03-07 ENCOUNTER — Other Ambulatory Visit: Payer: Self-pay

## 2020-03-07 DIAGNOSIS — Z171 Estrogen receptor negative status [ER-]: Secondary | ICD-10-CM | POA: Diagnosis present

## 2020-03-07 DIAGNOSIS — C50811 Malignant neoplasm of overlapping sites of right female breast: Secondary | ICD-10-CM | POA: Diagnosis not present

## 2020-03-07 LAB — CYTOLOGY - NON PAP

## 2020-03-10 ENCOUNTER — Ambulatory Visit: Payer: Medicaid Other

## 2020-03-10 ENCOUNTER — Other Ambulatory Visit: Payer: Self-pay

## 2020-03-10 ENCOUNTER — Ambulatory Visit
Admission: RE | Admit: 2020-03-10 | Discharge: 2020-03-10 | Disposition: A | Discharge status: Home / Self Care | Payer: Medicaid Other | Source: Ambulatory Visit | Attending: Radiation Oncology | Admitting: Radiation Oncology

## 2020-03-10 LAB — CULTURE, BODY FLUID W GRAM STAIN -BOTTLE: Culture: NO GROWTH

## 2020-03-10 NOTE — Progress Notes (Deleted)
[place]  [phone] [phone]    ID: Terri Estes DOB: May 22, 1986  MR#: [id number]  BOF#:[id number]  Patient Care Team: Patient, No Pcp Per as PCP - General (General Practice) Terri Overall, MD as Consulting Physician (General Surgery) Magrinat, Terri Dad, MD as Consulting Physician (Oncology) Terri Rudd, MD as Consulting Physician (Radiation Oncology) Terri Germany, RN as Oncology Nurse Navigator Terri Kaufmann, RN as Oncology Nurse Navigator Terri Jude, MD as Consulting Physician (Obstetrics and Gynecology) Terri Estes, Terri Bathe, MD as Consulting Physician (Internal Medicine) Terri Estes, Terri Areola, LCSW as Social Worker (Licensed Clinical Social Worker) OTHER MD: Joylene Igo for [facility] is [phone]   CHIEF COMPLAINT: 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    REVIEW OF SYSTEMS: Terri Estes   HISTORY OF CURRENT ILLNESS: From the original intake note:  Terri Estes presented with swelling along the left side of the face with neck pain. She then underwent a neck CT on 04/11/2019 showing: Enlarged left level 2 lymph nodes with associated inflammatory change including stranding of the adjacent fat and thickening of the platysma. While malignancy is not excluded, this is most concerning for infection. No focal abscess is present. Hypoattenuation within 1 of the left level 2 lymph nodes likely represents central necrosis or suppuration of the node. No primary malignancy or other abscess. Left upper lobe pulmonary nodules may be inflammatory. The largest measures 0.7 x 0.7 x 0.6 cm. CT of the chest with contrast may be useful for further evaluation.  She also underwent a chest CT on the same day showing: Complex  right breast mass measuring 5.6 x 4.5 x 4.7 cm consistent with a primary breast carcinoma. Malignant right axillary adenopathy measuring up to 3.1 cm. Multiple bilateral pulmonary nodules are concerning for metastatic disease. Given the right breast mass, the left cervical lymph nodes are more likely malignant.  She then underwent bilateral diagnostic mammography with tomography and right breast ultrasonography at [facility] on 04/19/2019 showing: Breast Density Category C; findings which are highly suspicious for multicentric inflammatory right breast cancer with right axillary nodal metastatic disease; no mammographic evidence of malignancy involving the left breast.  Accordingly on 04/19/2019 she proceeded to biopsy of the right breast area in question. The pathology from this procedure showed ([id number]): invasive ductal carcinoma, grade III, upper inner quadrant, 12:30 o'clock, 5.0 cm from the nipple. Prognostic indicators significant for: estrogen receptor, 0% negative and progesterone receptor, 0% negative. Proliferation marker Ki67 at 90%. HER2 negative (0+).  Additional biopsies of the right breast and right axilla were performed on the same day. The pathology from this procedure showed ([id number]): 2. Breast, right, needle core biopsy, satellite mass UOQ, 10 o'clock, 8cmfn  - Invasive ductal carcinoma, grade III 3. Lymph node, needle/core biopsy, level 1 right axilla   - Invasive ductal carcinoma, grade III  The patient's subsequent history is as detailed below.   PAST MEDICAL HISTORY: Past Medical History:  Diagnosis Date  . Anemia    after pregnancy  . Anxiety    severe not taking meds  . Asthma    inhaler  4x per day  . Cancer ([place])   . Chronic pain   . Depression   . Family history of breast cancer   . GERD (gastroesophageal reflux disease)   . History  of substance abuse ([place])   . Pregnancy induced hypertension    takes medication now.  . Psoriasis    all over  body  . Trichomonas infection   . Vaginal Pap smear, abnormal     PAST SURGICAL HISTORY: Past Surgical History:  Procedure Laterality Date  . CESAREAN SECTION  05/26/2008  . CESAREAN SECTION N/A 07/10/2017   Procedure: CESAREAN SECTION;  Surgeon: Florian Buff, MD;  Location: [place];  Service: Obstetrics;  Laterality: N/A;  . CESAREAN SECTION N/A 09/23/2018   Procedure: CESAREAN SECTION;  Surgeon: Terri Jude, MD;  Location: [place];  Service: Obstetrics;  Laterality: N/A;  . COLPOSCOPY    . MASTECTOMY WITH AXILLARY LYMPH NODE DISSECTION Right 10/16/2019   Procedure: RIGHT MASTECTOMY WITH AXILLARY LYMPH NODE DISSECTION;  Surgeon: Terri Overall, MD;  Location: [facility];  Service: General;  Laterality: Right;  . PORTACATH PLACEMENT Left 04/30/2019   Procedure: INSERTION PORT-A-CATH WITH ULTRASOUND;  Surgeon: Terri Overall, MD;  Location: [facility];  Service: General;  Laterality: Left;  Left Subclavian vein  . WISDOM TOOTH EXTRACTION      FAMILY HISTORY: Family History  Problem Relation Age of Onset  . Kidney disease Maternal Grandmother   . Breast cancer Maternal Grandmother 28       metastatic to liver; d. 17  . Kidney disease Maternal Grandfather   . Kidney disease Paternal Grandmother   . Diabetes Paternal Grandmother   . Breast cancer Paternal Grandmother   . Kidney disease Paternal Grandfather   . Breast cancer Paternal Aunt   . Cerebral palsy Child   . Breast cancer Paternal Great-grandmother   . Pancreatic cancer Other        PGMs brother   . Throat cancer Paternal Uncle   . Breast cancer Other        PGMs sister  (Updated 04/25/2019) [name]'s father is living at age 60. Patients' mother is also living at age 67. [place] notes, however, that she was raised by her grandmother.) The patient has 0 brothers and 5 sisters. Patient denies anyone in her family having ovarian, prostate, or pancreatic cancer. Her maternal grandmother was diagnosed with breast  cancer, unsure what age. On her father's side, she reports her grandmother had cysts in her breasts, her great-aunt might have had breast cancer, and her great-grandmother was diagnosed with breast cancer at an older age.   GYNECOLOGIC HISTORY:  No LMP recorded. (Menstrual status: Chemotherapy).  She had an emergent C-section in 09/23/2018 for abruptio placenta at 30 weeks pregnancy, also has a history of eclampsia Menarche:    years old Age at first live birth: 34 years old [place] P: 3 LMP: 04/18/2019 Contraceptive: On goserelin Hysterectomy?: no BSO?: no   SOCIAL HISTORY:  Terri Estes is currently unemployed. She previously worked at [employer]. She is engaged. Fiance [name] runs a Midwife and works other jobs.  The patient recently moved to [place].  Daughter Demetrius Charity, age 35, has cerebral palsy. Son [name], age 39, has severe asthma and is developmentally disabled. Daughter Yetta Glassman was born on 09/23/2018 prematurely and remains on oxygen at home.  The patient's mother lives in [place] and frequently comes by to assist.  ADVANCED DIRECTIVES: not in place.    HEALTH MAINTENANCE: Social History   Tobacco Use  . Smoking status: Current Every Day Smoker    Packs/day: 0.10    Years: 0.00    Pack years: 0.00    Types: Cigarettes  . Smokeless  tobacco: Never Used  . Tobacco comment: trying to quit - smokes 2 cigarettes/day  Substance Use Topics  . Alcohol use: No  . Drug use: Yes    Frequency: 7.0 times per week    Types: Marijuana    Comment: subutex last dose 10/15/19, last dose Sun 10/14/19    Colonoscopy: n/a  PAP: 05/10/2018  Bone density: never done Mammography: first performed following abnormal CT  No Known Allergies   Current Outpatient Medications  Medication Sig Dispense Refill  . albuterol (VENTOLIN HFA) 108 (90 Base) MCG/ACT inhaler Inhale 2 puffs into the lungs every 6 (six) hours as needed for wheezing or shortness of breath. 8 g 6  . albuterol  (VENTOLIN HFA) 108 (90 Base) MCG/ACT inhaler Inhale 2 puffs into the lungs 4 (four) times daily. 8 g 3  . benzonatate (TESSALON) 100 MG capsule Take 1 capsule (100 mg total) by mouth 3 (three) times daily as needed for cough. 30 capsule 0  . buprenorphine (SUBUTEX) 8 MG SUBL SL tablet Place 8 mg under the tongue 2 (two) times daily.     . capecitabine (XELODA) 500 MG tablet Take 2 tablets (1,000 mg total) by mouth 2 (two) times daily after a meal. Take Monday-Friday. Take only on days of radiation. 80 tablet 1  . cholestyramine (QUESTRAN) 4 g packet Take 1 packet (4 g total) by mouth 3 (three) times daily with meals. (Patient taking differently: Take 4 g by mouth 3 (three) times daily with meals as needed (diarrhea). ) 60 each 12  . clonazePAM (KLONOPIN) 1 MG tablet Take 1 tablet (1 mg total) by mouth at bedtime as needed for anxiety. 30 tablet 0  . cyclobenzaprine (FLEXERIL) 10 MG tablet Take 1 tablet (10 mg total) by mouth 3 (three) times daily as needed for muscle spasms. 90 tablet 2  . dexamethasone (DECADRON) 4 MG tablet Take 1 tablet (4 mg total) by mouth daily with breakfast. 30 tablet 0  . Famotidine (PEPCID PO) Take 1 tablet by mouth daily.    Marland Kitchen gabapentin (NEURONTIN) 300 MG capsule TAKE 1 CAPSULE(300 MG) BY MOUTH THREE TIMES DAILY (Patient taking differently: Take 300 mg by mouth 3 (three) times daily. ) 90 capsule 0  . guaiFENesin 200 MG tablet Take 200 mg by mouth every 4 (four) hours as needed for cough or to loosen phlegm.    Marland Kitchen HYDROcodone-homatropine (HYCODAN) 5-1.5 MG/5ML syrup Take 5 mLs by mouth every 6 (six) hours as needed for cough. 120 mL 0  . metoCLOPramide (REGLAN) 10 MG tablet Take 1 tablet (10 mg total) by mouth every 8 (eight) hours as needed for nausea. 30 tablet 0  . ondansetron (ZOFRAN) 8 MG tablet Take 1 tablet (8 mg total) by mouth every 8 (eight) hours as needed for nausea or vomiting. 20 tablet 0  . oxyCODONE (OXY IR/ROXICODONE) 5 MG immediate release tablet Take 1  tablet (5 mg total) by mouth every 6 (six) hours as needed for severe pain. 40 tablet 0  . [START ON 03/26/2020] rivaroxaban (XARELTO) 20 MG TABS tablet Take 1 tablet (20 mg total) by mouth daily with supper. Please start after the starter pack has been completed 30 tablet 3  . Rivaroxaban 15 & 20 MG TBPK Follow package directions: Take one 76m tablet by mouth twice a day. On day 22 (03/26/2020), switch to one 270mtablet once a day. Take with food. 51 each 0  . valACYclovir (VALTREX) 1000 MG tablet Take 1 tablet (1,000 mg total) by  mouth 2 (two) times daily. (Patient taking differently: Take 1,000 mg by mouth 2 (two) times daily as needed (viral outbreak). ) 60 tablet 0   No current facility-administered medications for this visit.   Facility-Administered Medications Ordered in Other Visits  Medication Dose Route Frequency Provider Last Rate Last Admin  . 0.9 % NaCl with KCl 20 mEq/ L  infusion   Intravenous Once Magrinat, Terri Dad, MD      . sodium chloride flush (NS) 0.9 % injection 10 mL  10 mL Intracatheter PRN Magrinat, Terri Dad, MD        OBJECTIVE: Young African-American woman examined in a wheelchair  There were no vitals filed for this visit. Wt Readings from Last 3 Encounters:  03/04/20 140 lb (63.5 kg)  02/15/20 144 lb (65.3 kg)  02/04/20 157 lb 12.8 oz (71.6 kg)   There is no height or weight on file to calculate BMI.    ECOG FS:1 - Symptomatic but completely ambulatory GENERAL: Patient is a well appearing female in no acute distress HEENT:  Sclerae anicteric.  Oropharynx clear and moist. No ulcerations or evidence of oropharyngeal candidiasis. Neck is supple.  NODES:  No cervical, supraclavicular, or axillary lymphadenopathy palpated.  BREAST EXAM:  Deferred. LUNGS:  Clear to auscultation bilaterally.  No wheezes or rhonchi. HEART:  Regular rate and rhythm. No murmur appreciated. ABDOMEN:  Soft, nontender.  Positive, normoactive bowel sounds. No organomegaly palpated. MSK:   No focal spinal tenderness to palpation. Full range of motion bilaterally in the upper extremities. EXTREMITIES:  No peripheral edema.   SKIN:  Clear with no obvious rashes or skin changes. No nail dyscrasia. NEURO:  Nonfocal. Well oriented.  Appropriate affect.    LAB RESULTS:  CMP     Component Value Date/Time   NA 133 (L) 03/06/2020 0713   NA 137 05/10/2018 1535   K 4.0 03/06/2020 0713   CL 93 (L) 03/06/2020 0713   CO2 28 03/06/2020 0713   GLUCOSE 109 (H) 03/06/2020 0713   BUN 14 03/06/2020 0713   BUN 7 05/10/2018 1535   CREATININE 0.50 03/06/2020 0713   CREATININE 0.81 07/03/2019 0919   CALCIUM 8.9 03/06/2020 0713   PROT 7.1 03/04/2020 1405   PROT 7.2 05/10/2018 1535   ALBUMIN 2.7 (L) 03/04/2020 1405   ALBUMIN 4.2 05/10/2018 1535   AST 39 03/04/2020 1405   AST 54 (H) 07/03/2019 0919   ALT 62 (H) 03/04/2020 1405   ALT 102 (H) 07/03/2019 0919   ALKPHOS 188 (H) 03/04/2020 1405   BILITOT 0.5 03/04/2020 1405   BILITOT <0.2 (L) 07/03/2019 0919   GFRNONAA >60 03/06/2020 0713   GFRNONAA >60 07/03/2019 0919   GFRAA >60 03/06/2020 0713   GFRAA >60 07/03/2019 0919    No results found for: TOTALPROTELP, ALBUMINELP, A1GS, A2GS, BETS, BETA2SER, GAMS, MSPIKE, SPEI  No results found for: KPAFRELGTCHN, LAMBDASER, KAPLAMBRATIO  Lab Results  Component Value Date   WBC 12.3 (H) 03/06/2020   NEUTROABS 12.7 (H) 03/04/2020   HGB 10.0 (L) 03/06/2020   HCT 31.6 (L) 03/06/2020   MCV 94.9 03/06/2020   PLT 126 (L) 03/06/2020    No results found for: LABCA2  No components found for: [id number]  Recent Labs  Lab 03/04/20 2005  INR 1.2    No results found for: LABCA2  No results found for: MGN003  No results found for: BCW888  No results found for: BVQ945  Lab Results  Component Value Date   CA2729 176.2 (H) 02/15/2020  No components found for: HGQUANT  Lab Results  Component Value Date   CEA1 4.44 02/15/2020   /  CEA ([facility])  Date Value Ref Range  Status  02/15/2020 4.44 0.00 - 5.00 ng/mL Final    Comment:    (NOTE) This test was performed using [employer]'s Chemiluminescent Microparticle Immunoassay. Values obtained from different assay methods cannot be used interchangeably. Please note that 5-10% of patients who smoke may see CEA levels up to 6.9 ng/mL. Performed at [facility], [place] [address] [place], [place] [address]      No results found for: AFPTUMOR  No results found for: [facility]  Lab Results  Component Value Date   HGBA 97.4 12/16/2016   (Hemoglobinopathy evaluation)   No results found for: LDH  No results found for: IRON, TIBC, IRONPCTSAT (Iron and TIBC)  Lab Results  Component Value Date   FERRITIN 900 (H) 09/12/2019    Urinalysis    Component Value Date/Time   COLORURINE YELLOW 11/29/2019 0138   APPEARANCEUR HAZY (A) 11/29/2019 0138   LABSPEC 1.020 11/29/2019 0138   PHURINE 6.0 11/29/2019 0138   GLUCOSEU NEGATIVE 11/29/2019 0138   HGBUR NEGATIVE 11/29/2019 0138   BILIRUBINUR NEGATIVE 11/29/2019 0138   KETONESUR 5 (A) 11/29/2019 0138   PROTEINUR NEGATIVE 11/29/2019 0138   UROBILINOGEN 4.0 (H) 01/13/2018 1417   NITRITE NEGATIVE 11/29/2019 0138   LEUKOCYTESUR MODERATE (A) 11/29/2019 0138     STUDIES:  DG Chest 1 View  Result Date: 03/05/2020 CLINICAL DATA:  Post thoracentesis.  Breast cancer EXAM: CHEST  1 VIEW COMPARISON:  03/04/2020 FINDINGS: Large right pleural effusion has progressed with near complete collapse of the right lung. No pneumothorax. Mild left lower lobe atelectasis or infiltrate. Port-A-Cath tip SVC. IMPRESSION: Progression of large right pleural effusion with collapse of the right lung. No pneumothorax. Electronically Signed   By: Franchot Gallo M.D.   On: 03/05/2020 11:38   DG Chest 2 View  Result Date: 03/04/2020 CLINICAL DATA:  Cough, severe shortness of breath, and tachycardia. Known metastatic breast cancer to lung. EXAM: CHEST - 2 VIEW  COMPARISON:  Radiograph 02/07/2020, chest CT 01/31/2020 FINDINGS: Large right pleural effusion which has reaccumulated since recent thoracentesis, only small portion of the right upper lung is aerated. Left chest port remains in place. Interstitial thickening in the left lung which appears new. Heart appears normal in size. No significant left pleural effusion. T1 compression fracture on prior chest CT is not well demonstrated radiographically. No definite new osseous abnormalities. Right mastectomy with surgical clips in the axilla. IMPRESSION: 1. Large right pleural effusion which has increased in size since recent thoracentesis, only small portion of the right upper lung is aerated. 2. New interstitial thickening in the left lung, may be pulmonary edema or atypical infection. Electronically Signed   By: Keith Rake M.D.   On: 03/04/2020 15:33   CT Angio Chest PE W and/or Wo Contrast  Result Date: 03/04/2020 CLINICAL DATA:  History of carcinoma of the breast with right-sided chest pain and pleural effusion EXAM: CT ANGIOGRAPHY CHEST WITH CONTRAST TECHNIQUE: Multidetector CT imaging of the chest was performed using the standard protocol during bolus administration of intravenous contrast. Multiplanar CT image reconstructions and MIPs were obtained to evaluate the vascular anatomy. CONTRAST:  30m OMNIPAQUE IOHEXOL 350 MG/ML SOLN COMPARISON:  Chest x-ray from earlier in the day. FINDINGS: Cardiovascular: Thoracic aorta and its branches are within normal limits. No aneurysmal dilatation or dissection is seen. No cardiac enlargement  is noted. No significant coronary artery disease is seen. Pulmonary artery shows poor opacification one of the peripheral lower lobe branches on the left posteriorly consistent with small pulmonary embolus. No other sizable emboli are seen. Mediastinum/Nodes: Thoracic inlet is within normal limits. Increased soft tissue density is noted in the region of the right hilum and  subcarinal region consistent with lymphadenopathy and local mass. This is stable in appearance from the prior exam. The esophagus is within normal limits. Lungs/Pleura: Left lung is well aerated and demonstrates some new patchy ground-glass opacification increased from the prior exam. Some small parenchymal nodules are noted as well with slight increase in size particularly in the left upper lobe on image number 49 of series 10. The nodule measures 4 mm and previously measured approximately 3 mm. Increasing right perihilar density is noted a combination of hilar adenopathy and peripheral consolidation. The lower lobe consolidation is stable although the degree of upper and middle lobe consolidation has increased in the interval from the prior exam. Large pleural effusion is noted increased from the prior study. Some suggestion of pleural enhancement is noted likely representing pleural metastatic disease. Upper Abdomen: Upper abdomen shows increase in size and number of the hepatic metastatic disease. Specifically a caudate lobe lesion which previously measured 2.2 cm in greatest dimension now measures at least 4.3 cm in greatest dimension. Remainder of the upper abdomen is within normal limits. Musculoskeletal: Healing fracture of the right anterior eighth rib is noted. Compression deformity is noted at T1 increased when compared with the prior exam. This likely represents a pathologic fracture. Changes of prior right mastectomy are noted. Review of the MIP images confirms the above findings. IMPRESSION: Tiny left lower lobe pulmonary embolus. Increase in right-sided pleural effusion with evidence of pleural enhancement suggestive of metastatic deposits. Increase in the degree of right lung consolidation secondary to central adenopathy and likely underlying mass. Increase in size and number of metastatic lesions within the liver as described. Increase in size of nodular changes within the left lung as well as some  new ground-glass opacities. Correlation with the pending COVID-19 testing is recommended. Healing fracture of the right anterior eighth rib. Increased compression deformity at T1 likely pathologic in nature. Electronically Signed   By: Inez Catalina M.D.   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 EXAM: ULTRASOUND GUIDED DIAGNOSTIC AND THERAPEUTIC RIGHT THORACENTESIS MEDICATIONS: None COMPLICATIONS: None immediate PROCEDURE: An ultrasound guided thoracentesis was thoroughly discussed with the patient/mother and questions answered. The benefits, risks, alternatives and complications were also discussed. The patient understands and wishes to proceed with the procedure. Written consent was obtained. Ultrasound was performed to localize and mark an adequate pocket of fluid in the right chest. The area was then prepped and draped in the normal sterile fashion. 1% Lidocaine was used for local anesthesia. Under ultrasound guidance a 6 Fr Safe-T-Centesis catheter was introduced. Thoracentesis was performed. The catheter was removed and a dressing applied. FINDINGS: A total of approximately 570 cc of bloody fluid was removed. Samples were sent to the laboratory as requested by the clinical team. IMPRESSION: Successful ultrasound guided diagnostic and therapeutic right thoracentesis yielding 570 cc of pleural fluid. Read by: Rowe Robert, PA-C Electronically Signed   By: Sandi Mariscal M.D.   On: 03/05/2020 11:46     ELIGIBLE FOR  AVAILABLE RESEARCH PROTOCOL: no   ASSESSMENT: 34 y.o. [place]. [place] woman status post right breast biopsy x2 and right axillary lymph node biopsy 04/19/2019 for a clinical T4 N2 MX, stage IIIC invasive ductal carcinoma, grade 3, triple negative, with an MIB-1 of  90%  (a) chest CT scan 04/11/2019 shows multiple subcentimeter lung nodules  (b) PET scan denied by insurance  (c) CT A/P and bone scan due 05/09/2019 show a very small low-density lesion in the lateral segment of the left liver which is indeterminate, and sclerosis of the right SI joint, which is felt to be likely degenerative; bone scan was entirely negative.  (c) baseline breast MRI 04/30/2019 shows involvement of all 4 quadrants in the right breast as well as right axillary subpectoral and internal mammary lymphadenopathy.  Left breast was unremarkable  (1) neoadjuvant chemotherapy consisting of  (a) pembrolizumab started 05/03/2019, third dose delayed because of elevated liver function tests, resumed 08/02/2019  (b) paclitaxel weekly x12 with carboplatin every 21 days x 4, started 05/08/2019   (i) tolerated carboplatin AUC 5-day 1 poorly, switched to AUC 2 on day 1 day 8   (ii) cycle 2 delayed 1 week because of elevated liver function tests   (iii) carboplatin and paclitaxel discontinued after 07/03/2019 because of neuropathy, (received 7 weekly doses paclitaxel and one AUC5 carbo dose, four AUC2 doses)  (c) cyclophosphamide and doxorubicin x4 started 07/03/2019, completed 08/29/2019   (i) 2d dose given day 28 due to poor tolerance; other doses Q 21 days   (2) s/p right mastectomy and ALND 10/16/2019 showing a residual ypT2 ypN2 invasive ductal carcinoma, grade 3, with negative margins  (a) a total of 10 lymph nodes were removed, 4 with macrometastases, 1 with a micrometastasis  (3) adjuvant radiation to follow, consider capecitabine sensitization  (4) genetics testing  (a) genetic testing 05/14/2019 through the Common Hereditary Gene Panel offered by [facility] found no deleterious mutations in APC, ATM, AXIN2, BARD1, BMPR1A, BRCA1, BRCA2, BRIP1, CDH1, CDK4, CDKN2A (p14ARF), CDKN2A (p16INK4a), CHEK2, CTNNA1, DICER1, EPCAM (Deletion/duplication testing only), GREM1 (promoter region  deletion/duplication testing only), KIT, MEN1, MLH1, MSH2, MSH3, MSH6, MUTYH, NBN, NF1, NHTL1, PALB2, PDGFRA, PMS2, POLD1, POLE, PTEN, RAD50, RAD51C, RAD51D, RNF43, SDHB, SDHC, SDHD, SMAD4, SMARCA4. STK11, TP53, TSC1, TSC2, and VHL.  The following genes were evaluated for sequence changes only: SDHA and HOXB13 c.251G>A variant only  (5) goserelin started 05/08/2019  (6) pembrolizumab started 08/02/2019, discontinued after fourth dose (10/02/2019) with psoriasis flare  (7) psoriasis:  (a) weekly methotrexate started 11/10/2019, held after 11/17/2019 dose so as not to overlap with radiation  (b) intravenous cyclophosphamide 1 g every 3 weeks starting 11/21/2019  (8) METASTATIC/PROGRESSIVE DISEASE: FEB 2021  (a) CT chest 01/31/2020 shows new lung and liver lesions, right pleural effusion, mediastinal adenopathy, T1 wedge deformity  (b) brain MRI  (c) right thoracentesis 02/07/2020 cytologically positive for triple negative GATA-3 positive cells  (9) started capecitabine at radiosensitizing doses 02/13/2020    PLAN: Terri Estes is toler  She continues on goserelin/Zoladex, with her next injection due 02/21/2020.  I will see her again with her March Zoladex dose.  We should have the Caris results and brain MRI results by then.  She knows to call us for any other issue that may develop before that time.  Total encounter time 35 minutes.Wilber Bihari, NP 03/10/20 9:26 AM Medical Oncology and Hematology [facility] [place], [place] [address] Tel. [phone]    Fax. [phone]  *Total  Encounter Time as defined by the Centers for Medicare and Medicaid Services includes, in addition to the face-to-face time of a patient visit (documented in the note above) non-face-to-face time: obtaining and reviewing outside history, ordering and reviewing medications, tests or procedures, care coordination (communications with other health care professionals or caregivers)  and documentation in the medical record.

## 2020-03-11 ENCOUNTER — Inpatient Hospital Stay: Payer: Medicaid Other | Admitting: Adult Health

## 2020-03-11 ENCOUNTER — Inpatient Hospital Stay (HOSPITAL_BASED_OUTPATIENT_CLINIC_OR_DEPARTMENT_OTHER): Payer: Medicaid Other | Admitting: Adult Health

## 2020-03-11 ENCOUNTER — Other Ambulatory Visit: Payer: Self-pay

## 2020-03-11 ENCOUNTER — Ambulatory Visit: Payer: Medicaid Other

## 2020-03-11 ENCOUNTER — Emergency Department (HOSPITAL_COMMUNITY): Payer: Medicaid Other

## 2020-03-11 ENCOUNTER — Encounter: Payer: Self-pay | Admitting: Adult Health

## 2020-03-11 ENCOUNTER — Inpatient Hospital Stay (HOSPITAL_COMMUNITY)
Admission: EM | Admit: 2020-03-11 | Discharge: 2020-03-27 | DRG: 208 | Disposition: E | Payer: Medicaid Other | Source: Ambulatory Visit | Attending: Pulmonary Disease | Admitting: Pulmonary Disease

## 2020-03-11 ENCOUNTER — Telehealth: Payer: Self-pay | Admitting: *Deleted

## 2020-03-11 ENCOUNTER — Encounter: Payer: Self-pay | Admitting: Oncology

## 2020-03-11 VITALS — BP 102/74 | HR 132 | Resp 22 | Wt 149.3 lb

## 2020-03-11 DIAGNOSIS — D638 Anemia in other chronic diseases classified elsewhere: Secondary | ICD-10-CM | POA: Diagnosis present

## 2020-03-11 DIAGNOSIS — R0603 Acute respiratory distress: Secondary | ICD-10-CM

## 2020-03-11 DIAGNOSIS — C7802 Secondary malignant neoplasm of left lung: Secondary | ICD-10-CM | POA: Diagnosis present

## 2020-03-11 DIAGNOSIS — Z9911 Dependence on respirator [ventilator] status: Secondary | ICD-10-CM | POA: Diagnosis not present

## 2020-03-11 DIAGNOSIS — Z20822 Contact with and (suspected) exposure to covid-19: Secondary | ICD-10-CM | POA: Diagnosis present

## 2020-03-11 DIAGNOSIS — Z171 Estrogen receptor negative status [ER-]: Secondary | ICD-10-CM

## 2020-03-11 DIAGNOSIS — F17210 Nicotine dependence, cigarettes, uncomplicated: Secondary | ICD-10-CM | POA: Diagnosis present

## 2020-03-11 DIAGNOSIS — R0602 Shortness of breath: Secondary | ICD-10-CM | POA: Diagnosis not present

## 2020-03-11 DIAGNOSIS — F329 Major depressive disorder, single episode, unspecified: Secondary | ICD-10-CM | POA: Diagnosis present

## 2020-03-11 DIAGNOSIS — Z9011 Acquired absence of right breast and nipple: Secondary | ICD-10-CM

## 2020-03-11 DIAGNOSIS — J90 Pleural effusion, not elsewhere classified: Secondary | ICD-10-CM | POA: Diagnosis not present

## 2020-03-11 DIAGNOSIS — Z800 Family history of malignant neoplasm of digestive organs: Secondary | ICD-10-CM

## 2020-03-11 DIAGNOSIS — Z978 Presence of other specified devices: Secondary | ICD-10-CM

## 2020-03-11 DIAGNOSIS — D630 Anemia in neoplastic disease: Secondary | ICD-10-CM | POA: Diagnosis present

## 2020-03-11 DIAGNOSIS — I2699 Other pulmonary embolism without acute cor pulmonale: Secondary | ICD-10-CM | POA: Diagnosis not present

## 2020-03-11 DIAGNOSIS — E46 Unspecified protein-calorie malnutrition: Secondary | ICD-10-CM | POA: Diagnosis present

## 2020-03-11 DIAGNOSIS — R579 Shock, unspecified: Secondary | ICD-10-CM | POA: Diagnosis present

## 2020-03-11 DIAGNOSIS — Z515 Encounter for palliative care: Secondary | ICD-10-CM | POA: Diagnosis not present

## 2020-03-11 DIAGNOSIS — R531 Weakness: Secondary | ICD-10-CM | POA: Diagnosis not present

## 2020-03-11 DIAGNOSIS — E872 Acidosis: Secondary | ICD-10-CM | POA: Diagnosis present

## 2020-03-11 DIAGNOSIS — Z9689 Presence of other specified functional implants: Secondary | ICD-10-CM

## 2020-03-11 DIAGNOSIS — J181 Lobar pneumonia, unspecified organism: Secondary | ICD-10-CM | POA: Diagnosis present

## 2020-03-11 DIAGNOSIS — C7801 Secondary malignant neoplasm of right lung: Secondary | ICD-10-CM | POA: Diagnosis present

## 2020-03-11 DIAGNOSIS — C7800 Secondary malignant neoplasm of unspecified lung: Secondary | ICD-10-CM

## 2020-03-11 DIAGNOSIS — Z9221 Personal history of antineoplastic chemotherapy: Secondary | ICD-10-CM

## 2020-03-11 DIAGNOSIS — K219 Gastro-esophageal reflux disease without esophagitis: Secondary | ICD-10-CM | POA: Diagnosis present

## 2020-03-11 DIAGNOSIS — L409 Psoriasis, unspecified: Secondary | ICD-10-CM | POA: Diagnosis present

## 2020-03-11 DIAGNOSIS — J9601 Acute respiratory failure with hypoxia: Secondary | ICD-10-CM | POA: Diagnosis present

## 2020-03-11 DIAGNOSIS — C787 Secondary malignant neoplasm of liver and intrahepatic bile duct: Secondary | ICD-10-CM | POA: Diagnosis present

## 2020-03-11 DIAGNOSIS — J9602 Acute respiratory failure with hypercapnia: Secondary | ICD-10-CM | POA: Diagnosis present

## 2020-03-11 DIAGNOSIS — C50919 Malignant neoplasm of unspecified site of unspecified female breast: Secondary | ICD-10-CM | POA: Diagnosis not present

## 2020-03-11 DIAGNOSIS — C50811 Malignant neoplasm of overlapping sites of right female breast: Secondary | ICD-10-CM | POA: Diagnosis present

## 2020-03-11 DIAGNOSIS — Z66 Do not resuscitate: Secondary | ICD-10-CM | POA: Diagnosis not present

## 2020-03-11 DIAGNOSIS — Z781 Physical restraint status: Secondary | ICD-10-CM

## 2020-03-11 DIAGNOSIS — J910 Malignant pleural effusion: Secondary | ICD-10-CM | POA: Diagnosis present

## 2020-03-11 DIAGNOSIS — Z923 Personal history of irradiation: Secondary | ICD-10-CM | POA: Diagnosis not present

## 2020-03-11 DIAGNOSIS — D649 Anemia, unspecified: Secondary | ICD-10-CM | POA: Diagnosis not present

## 2020-03-11 DIAGNOSIS — Z7901 Long term (current) use of anticoagulants: Secondary | ICD-10-CM

## 2020-03-11 DIAGNOSIS — D61818 Other pancytopenia: Secondary | ICD-10-CM | POA: Diagnosis present

## 2020-03-11 DIAGNOSIS — R627 Adult failure to thrive: Secondary | ICD-10-CM | POA: Diagnosis present

## 2020-03-11 DIAGNOSIS — F419 Anxiety disorder, unspecified: Secondary | ICD-10-CM | POA: Diagnosis present

## 2020-03-11 DIAGNOSIS — Z833 Family history of diabetes mellitus: Secondary | ICD-10-CM

## 2020-03-11 DIAGNOSIS — I313 Pericardial effusion (noninflammatory): Secondary | ICD-10-CM

## 2020-03-11 DIAGNOSIS — Z86711 Personal history of pulmonary embolism: Secondary | ICD-10-CM

## 2020-03-11 DIAGNOSIS — Z7189 Other specified counseling: Secondary | ICD-10-CM | POA: Diagnosis not present

## 2020-03-11 DIAGNOSIS — Z86718 Personal history of other venous thrombosis and embolism: Secondary | ICD-10-CM

## 2020-03-11 DIAGNOSIS — Z803 Family history of malignant neoplasm of breast: Secondary | ICD-10-CM

## 2020-03-11 DIAGNOSIS — D62 Acute posthemorrhagic anemia: Secondary | ICD-10-CM | POA: Diagnosis not present

## 2020-03-11 DIAGNOSIS — I314 Cardiac tamponade: Secondary | ICD-10-CM

## 2020-03-11 DIAGNOSIS — J9600 Acute respiratory failure, unspecified whether with hypoxia or hypercapnia: Secondary | ICD-10-CM | POA: Diagnosis present

## 2020-03-11 DIAGNOSIS — Z4659 Encounter for fitting and adjustment of other gastrointestinal appliance and device: Secondary | ICD-10-CM

## 2020-03-11 DIAGNOSIS — G894 Chronic pain syndrome: Secondary | ICD-10-CM | POA: Diagnosis present

## 2020-03-11 LAB — CBC WITH DIFFERENTIAL/PLATELET
Abs Immature Granulocytes: 0.14 10*3/uL — ABNORMAL HIGH (ref 0.00–0.07)
Basophils Absolute: 0 10*3/uL (ref 0.0–0.1)
Basophils Relative: 0 %
Eosinophils Absolute: 0 10*3/uL (ref 0.0–0.5)
Eosinophils Relative: 0 %
HCT: 23 % — ABNORMAL LOW (ref 36.0–46.0)
Hemoglobin: 7.2 g/dL — ABNORMAL LOW (ref 12.0–15.0)
Immature Granulocytes: 2 %
Lymphocytes Relative: 3 %
Lymphs Abs: 0.2 10*3/uL — ABNORMAL LOW (ref 0.7–4.0)
MCH: 31.4 pg (ref 26.0–34.0)
MCHC: 31.3 g/dL (ref 30.0–36.0)
MCV: 100.4 fL — ABNORMAL HIGH (ref 80.0–100.0)
Monocytes Absolute: 0.4 10*3/uL (ref 0.1–1.0)
Monocytes Relative: 5 %
Neutro Abs: 7.4 10*3/uL (ref 1.7–7.7)
Neutrophils Relative %: 90 %
Platelets: 90 10*3/uL — ABNORMAL LOW (ref 150–400)
RBC: 2.29 MIL/uL — ABNORMAL LOW (ref 3.87–5.11)
RDW: 20.9 % — ABNORMAL HIGH (ref 11.5–15.5)
WBC: 8.2 10*3/uL (ref 4.0–10.5)
nRBC: 2.6 % — ABNORMAL HIGH (ref 0.0–0.2)

## 2020-03-11 LAB — BASIC METABOLIC PANEL
Anion gap: 9 (ref 5–15)
BUN: 19 mg/dL (ref 6–20)
CO2: 29 mmol/L (ref 22–32)
Calcium: 8.8 mg/dL — ABNORMAL LOW (ref 8.9–10.3)
Chloride: 98 mmol/L (ref 98–111)
Creatinine, Ser: 0.51 mg/dL (ref 0.44–1.00)
GFR calc Af Amer: >60 mL/min (ref >60)
GFR calc non Af Amer: >60 mL/min (ref >60)
Glucose, Bld: 113 mg/dL — ABNORMAL HIGH (ref 70–99)
Potassium: 4.2 mmol/L (ref 3.5–5.1)
Sodium: 136 mmol/L (ref 135–145)

## 2020-03-11 LAB — RESPIRATORY PANEL BY RT PCR (FLU A&B, COVID)
Influenza A by PCR: NEGATIVE
Influenza B by PCR: NEGATIVE
SARS Coronavirus 2 by RT PCR: NEGATIVE

## 2020-03-11 LAB — PREPARE RBC (CROSSMATCH)

## 2020-03-11 LAB — BRAIN NATRIURETIC PEPTIDE: B Natriuretic Peptide: 43 pg/mL (ref 0.0–100.0)

## 2020-03-11 MED ORDER — INFLUENZA VAC SPLIT QUAD 0.5 ML IM SUSY: 0.5000 mL | PREFILLED_SYRINGE | INTRAMUSCULAR | Status: DC

## 2020-03-11 MED ORDER — HYDROMORPHONE HCL 1 MG/ML IJ SOLN
1.0000 mg | INTRAMUSCULAR | Status: DC | PRN
  Administered 2020-03-11 – 2020-03-13 (×5): 1 mg via INTRAVENOUS
  Filled 2020-03-11 (×5): qty 1

## 2020-03-11 MED ORDER — GUAIFENESIN-DM 100-10 MG/5ML PO SYRP
5.0000 mL | ORAL_SOLUTION | ORAL | Status: DC | PRN
  Administered 2020-03-11 – 2020-03-16 (×5): 5 mL via ORAL
  Filled 2020-03-11 (×6): qty 10

## 2020-03-11 MED ORDER — ACETAMINOPHEN 650 MG RE SUPP: 650.0000 mg | Freq: Four times a day (QID) | RECTAL | Status: DC | PRN

## 2020-03-11 MED ORDER — ONDANSETRON HCL 4 MG/2ML IJ SOLN: 4.0000 mg | Freq: Four times a day (QID) | INTRAMUSCULAR | Status: DC | PRN

## 2020-03-11 MED ORDER — ONDANSETRON HCL 4 MG PO TABS: 4.0000 mg | ORAL_TABLET | Freq: Four times a day (QID) | ORAL | Status: DC | PRN

## 2020-03-11 MED ORDER — ACETAMINOPHEN 325 MG PO TABS
650.0000 mg | ORAL_TABLET | Freq: Four times a day (QID) | ORAL | Status: DC | PRN
  Administered 2020-03-13: 650 mg via ORAL
  Filled 2020-03-11: qty 2

## 2020-03-11 MED ORDER — SODIUM CHLORIDE 0.9% IV SOLUTION: Freq: Once | INTRAVENOUS | Status: AC

## 2020-03-11 NOTE — ED Triage Notes (Signed)
Pt brought in from cancer [facility].  Per pt, she was discharged from hospital last Wednesday for [facility].  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  Pt tachypneic on assessment, placed on 2L O2 for comfort. Pt O2 sats on RA 94-96%.  Uncle with pt.

## 2020-03-11 NOTE — Progress Notes (Addendum)
[facility]  [phone] [phone]    ID: Terri Estes Estes DOB: 01-24-1986  MR#: [id number]  HGD#:[id number]  Patient Care Team: Patient, No Pcp Per as PCP - General (General Practice) Alphonsa Overall, MD as Consulting Physician (General Surgery) Magrinat, [name], MD as Consulting Physician (Oncology) Kyung Rudd, MD as Consulting Physician (Radiation Oncology) Rockwell Germany, RN as Oncology Nurse Navigator Mauro Kaufmann, RN as Oncology Nurse Navigator Donnamae Jude, MD as Consulting Physician (Obstetrics and Gynecology) Pavelock, Ralene Bathe, MD as Consulting Physician (Internal Medicine) Stoisits, Edwinna Areola, LCSW as Social Worker (Licensed Clinical Social Worker) OTHER MD: Joylene Igo for [facility] is [phone]   CHIEF COMPLAINT: 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  She has metastases to the lung and liver.  She has recurrent right pleural effusion.  She is here today noting that she cannot breathe.  She has been unable to walk around due to her increased shortness of breath.  She has also been having pain when taking in deep breaths.  She notes that she has required oxygen due to this, and is using her daughters oxygen (that her daughter no longer needs) due to this.    REVIEW OF SYSTEMS: Terri Estes Estes is feeling very poorly.  Her mobility is limited.  She is in pain.  She has lost weight, about 10 pounds since her February 8 visit.  She is very frustrated because her shortness of breath has continued to worsen, despite our interventions.  She is currently receiving radiation therapy.    HISTORY OF CURRENT ILLNESS: From the original intake note:  Terri Estes Estes presented with swelling along the left side of the face with neck pain. She then underwent a neck CT on  04/11/2019 showing: Enlarged left level 2 lymph nodes with associated inflammatory change including stranding of the adjacent fat and thickening of the platysma. While malignancy is not excluded, this is most concerning for infection. No focal abscess is present. Hypoattenuation within 1 of the left level 2 lymph nodes likely represents central necrosis or suppuration of the node. No primary malignancy or other abscess. Left upper lobe pulmonary nodules may be inflammatory. The largest measures 0.7 x 0.7 x 0.6 cm. CT of the chest with contrast may be useful for further evaluation.  She also underwent a chest CT on the same day showing: Complex right breast mass measuring 5.6 x 4.5 x 4.7 cm consistent with a primary breast carcinoma. Malignant right axillary adenopathy measuring up to 3.1 cm. Multiple bilateral pulmonary nodules are concerning for metastatic disease. Given the right breast mass, the left cervical lymph nodes are more likely malignant.  She then underwent bilateral diagnostic mammography with tomography and right breast ultrasonography at [facility] on 04/19/2019 showing: Breast Density Category C; findings which are highly suspicious for multicentric inflammatory right breast cancer with right axillary nodal metastatic disease; no mammographic evidence of malignancy involving the left breast.  Accordingly on 04/19/2019 she proceeded to biopsy of the right breast area in question. The pathology from this procedure showed ([id number]): invasive ductal carcinoma, grade III, upper inner quadrant, 12:30 o'clock, 5.0 cm from the nipple. Prognostic indicators significant for: estrogen receptor, 0% negative and progesterone receptor, 0% negative. Proliferation marker Ki67 at 90%. HER2 negative (0+).  Additional biopsies of the right breast and right axilla were performed on the same day. The  pathology from this procedure showed ([id number]): 2. Breast, right, needle core biopsy, satellite  mass UOQ, 10 o'clock, 8cmfn  - Invasive ductal carcinoma, grade III 3. Lymph node, needle/core biopsy, level 1 right axilla   - Invasive ductal carcinoma, grade III  The patient's subsequent history is as detailed below.   PAST MEDICAL HISTORY: Past Medical History:  Diagnosis Date  . Anemia    after pregnancy  . Anxiety    severe not taking meds  . Asthma    inhaler  4x per day  . Cancer ([place])   . Chronic pain   . Depression   . Family history of breast cancer   . GERD (gastroesophageal reflux disease)   . History of substance abuse ([place])   . Pregnancy induced hypertension    takes medication now.  . Psoriasis    all over body  . Trichomonas infection   . Vaginal Pap smear, abnormal     PAST SURGICAL HISTORY: Past Surgical History:  Procedure Laterality Date  . CESAREAN SECTION  05/26/2008  . CESAREAN SECTION N/A 07/10/2017   Procedure: CESAREAN SECTION;  Surgeon: Florian Buff, MD;  Location: [facility];  Service: Obstetrics;  Laterality: N/A;  . CESAREAN SECTION N/A 09/23/2018   Procedure: CESAREAN SECTION;  Surgeon: Donnamae Jude, MD;  Location: [facility];  Service: Obstetrics;  Laterality: N/A;  . COLPOSCOPY    . MASTECTOMY WITH AXILLARY LYMPH NODE DISSECTION Right 10/16/2019   Procedure: RIGHT MASTECTOMY WITH AXILLARY LYMPH NODE DISSECTION;  Surgeon: Alphonsa Overall, MD;  Location: [facility];  Service: General;  Laterality: Right;  . PORTACATH PLACEMENT Left 04/30/2019   Procedure: INSERTION PORT-A-CATH WITH ULTRASOUND;  Surgeon: Alphonsa Overall, MD;  Location: [facility];  Service: General;  Laterality: Left;  Left Subclavian vein  . WISDOM TOOTH EXTRACTION      FAMILY HISTORY: Family History  Problem Relation Age of Onset  . Kidney disease Maternal Grandmother   . Breast cancer Maternal Grandmother 28       metastatic to liver; d. 85  . Kidney disease Maternal Grandfather   . Kidney disease Paternal Grandmother   . Diabetes Paternal Grandmother   .  Breast cancer Paternal Grandmother   . Kidney disease Paternal Grandfather   . Breast cancer Paternal Aunt   . Cerebral palsy Child   . Breast cancer Paternal Great-grandmother   . Pancreatic cancer Other        PGMs brother   . Throat cancer Paternal Uncle   . Breast cancer Other        PGMs sister  (Updated 04/25/2019) Julane's father is living at age 69. Patients' mother is also living at age 66. Marland Kitchen[place]anzania notes, however, that she was raised by her grandmother.) The patient has 0 brothers and 5 sisters. Patient denies anyone in her family having ovarian, prostate, or pancreatic cancer. Her maternal grandmother was diagnosed with breast cancer, unsure what age. On her father's side, she reports her grandmother had cysts in her breasts, her great-aunt might have had breast cancer, and her great-grandmother was diagnosed with breast cancer at an older age.   GYNECOLOGIC HISTORY:  No LMP recorded. (Menstrual status: Chemotherapy).  She had an emergent C-section in 09/23/2018 for abruptio placenta at 30 weeks pregnancy, also has a history of eclampsia Menarche:    years old Age at first live birth: 34 years old [place] P: 3 LMP: 04/18/2019 Contraceptive: On goserelin Hysterectomy?: no BSO?: no   SOCIAL HISTORY:  Terri Estes Estes is  currently unemployed. She previously worked at [employer]. She is engaged. Fiance [name] runs a Midwife and works other jobs.  The patient recently moved to [place].  Daughter Demetrius Charity, age 47, has cerebral palsy. Son [name], age 2, has severe asthma and is developmentally disabled. Daughter Yetta Glassman was born on 09/23/2018 prematurely and remains on oxygen at home.  The patient's mother lives in [place] and frequently comes by to assist.  ADVANCED DIRECTIVES: not in place.    HEALTH MAINTENANCE: Social History   Tobacco Use  . Smoking status: Current Every Day Smoker    Packs/day: 0.10    Years: 0.00    Pack years: 0.00    Types: Cigarettes  .  Smokeless tobacco: Never Used  . Tobacco comment: trying to quit - smokes 2 cigarettes/day  Substance Use Topics  . Alcohol use: No  . Drug use: Yes    Frequency: 7.0 times per week    Types: Marijuana    Comment: subutex last dose 10/15/19, last dose Sun 10/14/19    Colonoscopy: n/a  PAP: 05/10/2018  Bone density: never done Mammography: first performed following abnormal CT  No Known Allergies   Current Outpatient Medications  Medication Sig Dispense Refill  . albuterol (VENTOLIN HFA) 108 (90 Base) MCG/ACT inhaler Inhale 2 puffs into the lungs every 6 (six) hours as needed for wheezing or shortness of breath. 8 g 6  . albuterol (VENTOLIN HFA) 108 (90 Base) MCG/ACT inhaler Inhale 2 puffs into the lungs 4 (four) times daily. 8 g 3  . benzonatate (TESSALON) 100 MG capsule Take 1 capsule (100 mg total) by mouth 3 (three) times daily as needed for cough. 30 capsule 0  . buprenorphine (SUBUTEX) 8 MG SUBL SL tablet Place 8 mg under the tongue 2 (two) times daily.     . capecitabine (XELODA) 500 MG tablet Take 2 tablets (1,000 mg total) by mouth 2 (two) times daily after a meal. Take Monday-Friday. Take only on days of radiation. 80 tablet 1  . cholestyramine (QUESTRAN) 4 g packet Take 1 packet (4 g total) by mouth 3 (three) times daily with meals. (Patient taking differently: Take 4 g by mouth 3 (three) times daily with meals as needed (diarrhea). ) 60 each 12  . clonazePAM (KLONOPIN) 1 MG tablet Take 1 tablet (1 mg total) by mouth at bedtime as needed for anxiety. 30 tablet 0  . cyclobenzaprine (FLEXERIL) 10 MG tablet Take 1 tablet (10 mg total) by mouth 3 (three) times daily as needed for muscle spasms. 90 tablet 2  . dexamethasone (DECADRON) 4 MG tablet Take 1 tablet (4 mg total) by mouth daily with breakfast. 30 tablet 0  . Famotidine (PEPCID PO) Take 1 tablet by mouth daily.    Marland Kitchen gabapentin (NEURONTIN) 300 MG capsule TAKE 1 CAPSULE(300 MG) BY MOUTH THREE TIMES DAILY (Patient taking  differently: Take 300 mg by mouth 3 (three) times daily. ) 90 capsule 0  . guaiFENesin 200 MG tablet Take 200 mg by mouth every 4 (four) hours as needed for cough or to loosen phlegm.    Marland Kitchen HYDROcodone-homatropine (HYCODAN) 5-1.5 MG/5ML syrup Take 5 mLs by mouth every 6 (six) hours as needed for cough. 120 mL 0  . metoCLOPramide (REGLAN) 10 MG tablet Take 1 tablet (10 mg total) by mouth every 8 (eight) hours as needed for nausea. 30 tablet 0  . ondansetron (ZOFRAN) 8 MG tablet Take 1 tablet (8 mg total) by mouth every 8 (eight) hours as needed  for nausea or vomiting. 20 tablet 0  . oxyCODONE (OXY IR/ROXICODONE) 5 MG immediate release tablet Take 1 tablet (5 mg total) by mouth every 6 (six) hours as needed for severe pain. 40 tablet 0  . [START ON 03/26/2020] rivaroxaban (XARELTO) 20 MG TABS tablet Take 1 tablet (20 mg total) by mouth daily with supper. Please start after the starter pack has been completed 30 tablet 3  . Rivaroxaban 15 & 20 MG TBPK Follow package directions: Take one 83m tablet by mouth twice a day. On day 22 (03/26/2020), switch to one 225mtablet once a day. Take with food. 51 each 0  . valACYclovir (VALTREX) 1000 MG tablet Take 1 tablet (1,000 mg total) by mouth 2 (two) times daily. (Patient taking differently: Take 1,000 mg by mouth 2 (two) times daily as needed (viral outbreak). ) 60 tablet 0   No current facility-administered medications for this visit.   Facility-Administered Medications Ordered in Other Visits  Medication Dose Route Frequency Provider Last Rate Last Admin  . 0.9 % NaCl with KCl 20 mEq/ L  infusion   Intravenous Once Magrinat, GuVirgie DadMD      . sodium chloride flush (NS) 0.9 % injection 10 mL  10 mL Intracatheter PRN Magrinat, GuVirgie DadMD        OBJECTIVE: Young African-American woman examined in a wheelchair  Vitals:   02/28/2020 1550 03/25/2020 1604  BP: 102/74   Pulse: (!) 129 (!) 132  Resp: (!) 22   SpO2: 98% 92%   Wt Readings from Last 3  Encounters:  03/20/2020 149 lb 4.8 oz (67.7 kg)  03/04/20 140 lb (63.5 kg)  02/15/20 144 lb (65.3 kg)   Body mass index is 27.31 kg/m.    ECOG FS:1 - Symptomatic but completely ambulatory GENERAL: Patient appears tired, and winded, in obvious distress HEENT:  Sclerae anicteric. Mask in place. Neck is supple.  NODES:  No cervical, supraclavicular, or axillary lymphadenopathy palpated.  BREAST EXAM:  Deferred. LUNGS:  Right lung diminished, left lung clear HEART: tachycardic ABDOMEN:  Soft, nontender.  Positive, normoactive bowel sounds. No organomegaly palpated. MSK:  No focal spinal tenderness to palpation. Full range of motion bilaterally in the upper extremities. EXTREMITIES:  No peripheral edema.   SKIN:  The psoriasis lesions I noted have improved NEURO:  Anxious and tearful    LAB RESULTS:  CMP     Component Value Date/Time   NA 133 (L) 03/06/2020 0713   NA 137 05/10/2018 1535   K 4.0 03/06/2020 0713   CL 93 (L) 03/06/2020 0713   CO2 28 03/06/2020 0713   GLUCOSE 109 (H) 03/06/2020 0713   BUN 14 03/06/2020 0713   BUN 7 05/10/2018 1535   CREATININE 0.50 03/06/2020 0713   CREATININE 0.81 07/03/2019 0919   CALCIUM 8.9 03/06/2020 0713   PROT 7.1 03/04/2020 1405   PROT 7.2 05/10/2018 1535   ALBUMIN 2.7 (L) 03/04/2020 1405   ALBUMIN 4.2 05/10/2018 1535   AST 39 03/04/2020 1405   AST 54 (H) 07/03/2019 0919   ALT 62 (H) 03/04/2020 1405   ALT 102 (H) 07/03/2019 0919   ALKPHOS 188 (H) 03/04/2020 1405   BILITOT 0.5 03/04/2020 1405   BILITOT <0.2 (L) 07/03/2019 0919   GFRNONAA >60 03/06/2020 0713   GFRNONAA >60 07/03/2019 0919   GFRAA >60 03/06/2020 0713   GFRAA >60 07/03/2019 0919    No results found for: TOTALPROTELP, ALBUMINELP, A1GS, A2GS, BETS, BETA2SER, GAMS, MSPIKE, SPEI  No results  found for: KPAFRELGTCHN, LAMBDASER, [facility]  Lab Results  Component Value Date   WBC 12.3 (H) 03/06/2020   NEUTROABS 12.7 (H) 03/04/2020   HGB 10.0 (L) 03/06/2020   HCT  31.6 (L) 03/06/2020   MCV 94.9 03/06/2020   PLT 126 (L) 03/06/2020    No results found for: LABCA2  No components found for: IHKVQQ595  Recent Labs  Lab 03/04/20 2005  INR 1.2    No results found for: LABCA2  No results found for: CAN199  No results found for: GLO756  No results found for: EPP295  Lab Results  Component Value Date   CA2729 176.2 (H) 02/15/2020    No components found for: HGQUANT  Lab Results  Component Value Date   CEA1 4.44 02/15/2020   /  CEA ([facility])  Date Value Ref Range Status  02/15/2020 4.44 0.00 - 5.00 ng/mL Final    Comment:    (NOTE) This test was performed using [employer]'s Chemiluminescent Microparticle Immunoassay. Values obtained from different assay methods cannot be used interchangeably. Please note that 5-10% of patients who smoke may see CEA levels up to 6.9 ng/mL. Performed at [facility], [place] [address] [place], [place] [address]      No results found for: AFPTUMOR  No results found for: [facility]  Lab Results  Component Value Date   HGBA 97.4 12/16/2016   (Hemoglobinopathy evaluation)   No results found for: LDH  No results found for: IRON, TIBC, IRONPCTSAT (Iron and TIBC)  Lab Results  Component Value Date   FERRITIN 900 (H) 09/12/2019    Urinalysis    Component Value Date/Time   COLORURINE YELLOW 11/29/2019 0138   APPEARANCEUR HAZY (A) 11/29/2019 0138   LABSPEC 1.020 11/29/2019 0138   PHURINE 6.0 11/29/2019 0138   GLUCOSEU NEGATIVE 11/29/2019 0138   HGBUR NEGATIVE 11/29/2019 0138   BILIRUBINUR NEGATIVE 11/29/2019 0138   KETONESUR 5 (A) 11/29/2019 0138   PROTEINUR NEGATIVE 11/29/2019 0138   UROBILINOGEN 4.0 (H) 01/13/2018 1417   NITRITE NEGATIVE 11/29/2019 0138   LEUKOCYTESUR MODERATE (A) 11/29/2019 0138     STUDIES:  DG Chest 1 View  Result Date: 03/05/2020 CLINICAL DATA:  Post thoracentesis.  Breast cancer EXAM: CHEST  1 VIEW COMPARISON:  03/04/2020  FINDINGS: Large right pleural effusion has progressed with near complete collapse of the right lung. No pneumothorax. Mild left lower lobe atelectasis or infiltrate. Port-A-Cath tip SVC. IMPRESSION: Progression of large right pleural effusion with collapse of the right lung. No pneumothorax. Electronically Signed   By: Franchot Gallo M.D.   On: 03/05/2020 11:38   DG Chest 2 View  Result Date: 03/04/2020 CLINICAL DATA:  Cough, severe shortness of breath, and tachycardia. Known metastatic breast cancer to lung. EXAM: CHEST - 2 VIEW COMPARISON:  Radiograph 02/07/2020, chest CT 01/31/2020 FINDINGS: Large right pleural effusion which has reaccumulated since recent thoracentesis, only small portion of the right upper lung is aerated. Left chest port remains in place. Interstitial thickening in the left lung which appears new. Heart appears normal in size. No significant left pleural effusion. T1 compression fracture on prior chest CT is not well demonstrated radiographically. No definite new osseous abnormalities. Right mastectomy with surgical clips in the axilla. IMPRESSION: 1. Large right pleural effusion which has increased in size since recent thoracentesis, only small portion of the right upper lung is aerated. 2. New interstitial thickening in the left lung, may be pulmonary edema or atypical infection. Electronically Signed   By: Threasa Beards  [place] M.D.   On: 03/04/2020 15:33   CT Angio Chest PE W and/or Wo Contrast  Result Date: 03/04/2020 CLINICAL DATA:  History of carcinoma of the breast with right-sided chest pain and pleural effusion EXAM: CT ANGIOGRAPHY CHEST WITH CONTRAST TECHNIQUE: Multidetector CT imaging of the chest was performed using the standard protocol during bolus administration of intravenous contrast. Multiplanar CT image reconstructions and MIPs were obtained to evaluate the vascular anatomy. CONTRAST:  37m OMNIPAQUE IOHEXOL 350 MG/ML SOLN COMPARISON:  Chest x-ray from earlier in the day.  FINDINGS: Cardiovascular: Thoracic aorta and its branches are within normal limits. No aneurysmal dilatation or dissection is seen. No cardiac enlargement is noted. No significant coronary artery disease is seen. Pulmonary artery shows poor opacification one of the peripheral lower lobe branches on the left posteriorly consistent with small pulmonary embolus. No other sizable emboli are seen. Mediastinum/Nodes: Thoracic inlet is within normal limits. Increased soft tissue density is noted in the region of the right hilum and subcarinal region consistent with lymphadenopathy and local mass. This is stable in appearance from the prior exam. The esophagus is within normal limits. Lungs/Pleura: Left lung is well aerated and demonstrates some new patchy ground-glass opacification increased from the prior exam. Some small parenchymal nodules are noted as well with slight increase in size particularly in the left upper lobe on image number 49 of series 10. The nodule measures 4 mm and previously measured approximately 3 mm. Increasing right perihilar density is noted a combination of hilar adenopathy and peripheral consolidation. The lower lobe consolidation is stable although the degree of upper and middle lobe consolidation has increased in the interval from the prior exam. Large pleural effusion is noted increased from the prior study. Some suggestion of pleural enhancement is noted likely representing pleural metastatic disease. Upper Abdomen: Upper abdomen shows increase in size and number of the hepatic metastatic disease. Specifically a caudate lobe lesion which previously measured 2.2 cm in greatest dimension now measures at least 4.3 cm in greatest dimension. Remainder of the upper abdomen is within normal limits. Musculoskeletal: Healing fracture of the right anterior eighth rib is noted. Compression deformity is noted at T1 increased when compared with the prior exam. This likely represents a pathologic  fracture. Changes of prior right mastectomy are noted. Review of the MIP images confirms the above findings. IMPRESSION: Tiny left lower lobe pulmonary embolus. Increase in right-sided pleural effusion with evidence of pleural enhancement suggestive of metastatic deposits. Increase in the degree of right lung consolidation secondary to central adenopathy and likely underlying mass. Increase in size and number of metastatic lesions within the liver as described. Increase in size of nodular changes within the left lung as well as some new ground-glass opacities. Correlation with the pending COVID-19 testing is recommended. Healing fracture of the right anterior eighth rib. Increased compression deformity at T1 likely pathologic in nature. Electronically Signed   By: MInez CatalinaM.D.   On: 03/04/2020 18:40   [place]ORACENTESIS ASP PLEURAL SPACE W/IMG GUIDE  Result Date: 03/05/2020 INDICATION: Patient with history of metastatic breast cancer with recurrent malignant right pleural effusion; request received for diagnostic and therapeutic right thoracentesis. EXAM: ULTRASOUND GUIDED DIAGNOSTIC AND THERAPEUTIC RIGHT THORACENTESIS MEDICATIONS: None COMPLICATIONS: None immediate PROCEDURE: An ultrasound guided thoracentesis was thoroughly discussed with the patient/mother and questions answered. The benefits, risks, alternatives and complications were also discussed. The patient understands and wishes to proceed with the procedure. Written consent was obtained. Ultrasound was performed to localize and mark  an adequate pocket of fluid in the right chest. The area was then prepped and draped in the normal sterile fashion. 1% Lidocaine was used for local anesthesia. Under ultrasound guidance a 6 Fr Safe-T-Centesis catheter was introduced. Thoracentesis was performed. The catheter was removed and a dressing applied. FINDINGS: A total of approximately 570 cc of bloody fluid was removed. Samples were sent to the laboratory as  requested by the clinical team. IMPRESSION: Successful ultrasound guided diagnostic and therapeutic right thoracentesis yielding 570 cc of pleural fluid. Read by: Rowe Robert, PA-C Electronically Signed   By: Sandi Mariscal M.D.   On: 03/05/2020 11:46     ELIGIBLE FOR AVAILABLE RESEARCH PROTOCOL: no   ASSESSMENT: 34 y.o. [place]. [place] woman status post right breast biopsy x2 and right axillary lymph node biopsy 04/19/2019 for a clinical T4 N2 MX, stage IIIC invasive ductal carcinoma, grade 3, triple negative, with an MIB-1 of 90%  (a) chest CT scan 04/11/2019 shows multiple subcentimeter lung nodules  (b) PET scan denied by insurance  (c) CT A/P and bone scan due 05/09/2019 show a very small low-density lesion in the lateral segment of the left liver which is indeterminate, and sclerosis of the right SI joint, which is felt to be likely degenerative; bone scan was entirely negative.  (c) baseline breast MRI 04/30/2019 shows involvement of all 4 quadrants in the right breast as well as right axillary subpectoral and internal mammary lymphadenopathy.  Left breast was unremarkable  (1) neoadjuvant chemotherapy consisting of  (a) pembrolizumab started 05/03/2019, third dose delayed because of elevated liver function tests, resumed 08/02/2019  (b) paclitaxel weekly x12 with carboplatin every 21 days x 4, started 05/08/2019   (i) tolerated carboplatin AUC 5-day 1 poorly, switched to AUC 2 on day 1 day 8   (ii) cycle 2 delayed 1 week because of elevated liver function tests   (iii) carboplatin and paclitaxel discontinued after 07/03/2019 because of neuropathy, (received 7 weekly doses paclitaxel and one AUC5 carbo dose, four AUC2 doses)  (c) cyclophosphamide and doxorubicin x4 started 07/03/2019, completed 08/29/2019   (i) 2d dose given day 28 due to poor tolerance; other doses Q 21 days   (2) s/p right mastectomy and ALND 10/16/2019 showing a residual ypT2 ypN2 invasive ductal carcinoma, grade 3, with  negative margins  (a) a total of 10 lymph nodes were removed, 4 with macrometastases, 1 with a micrometastasis  (3) adjuvant radiation to follow, consider capecitabine sensitization  (4) genetics testing  (a) genetic testing 05/14/2019 through the Common Hereditary Gene Panel offered by [facility] found no deleterious mutations in APC, ATM, AXIN2, BARD1, BMPR1A, BRCA1, BRCA2, BRIP1, CDH1, CDK4, CDKN2A (p14ARF), CDKN2A (p16INK4a), CHEK2, CTNNA1, DICER1, EPCAM (Deletion/duplication testing only), GREM1 (promoter region deletion/duplication testing only), KIT, MEN1, MLH1, MSH2, MSH3, MSH6, MUTYH, NBN, NF1, NHTL1, PALB2, PDGFRA, PMS2, POLD1, POLE, PTEN, RAD50, RAD51C, RAD51D, RNF43, SDHB, SDHC, SDHD, SMAD4, SMARCA4. STK11, TP53, TSC1, TSC2, and VHL.  The following genes were evaluated for sequence changes only: SDHA and HOXB13 c.251G>A variant only  (5) goserelin started 05/08/2019  (6) pembrolizumab started 08/02/2019, discontinued after fourth dose (10/02/2019) with psoriasis flare  (7) psoriasis:  (a) weekly methotrexate started 11/10/2019, held after 11/17/2019 dose so as not to overlap with radiation  (b) intravenous cyclophosphamide 1 g every 3 weeks starting 11/21/2019  (8) METASTATIC/PROGRESSIVE DISEASE: FEB 2021  (a) CT chest 01/31/2020 shows new lung and liver lesions, right pleural effusion, mediastinal adenopathy, T1 wedge deformity  (b) brain MRI  (c) right  thoracentesis 02/07/2020 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  She is frustrated and tearful.  Based on her physical exam, her pleural effusion has likely reaccumulated.  I recommended she go to the ER for drainage and further assessment.  She notes she cannot be at home due to her inability to walk around with  the shortness of breath.    Terri Estes Estes met with Dr. Jana Hakim.  Our recommendation would be that she have a pleurx placed when a thoracentesis is completed to reduce her recurrent pleural effusions, and ER visits.    She has f/u with Dr. Jana Hakim on 03/20/2020.    Total encounter time: 30 minutes*  Wilber Bihari, NP 03/21/2020 4:57 PM Medical Oncology and Hematology [facility] [place], [place] [address] Tel. [phone]    Fax. [phone]   ADDENDUM: Giannie is actually more short of breath now than she was before her recent right thoracentesis.  Very likely the fluid has reaccumulated.  She will need repeat thoracentesis with Pleurx placement.  We discussed this at the time of the visit 03/03/2020  Since that time the patient was found to be severely anemic and the possibility that we may be dealing with bleeding instead of fluid reaccumulation was discussed.  The patient was transfused.  She eventually underwent thoracentesis on 03/13/2020 yielding 400 cc of amber fluid.    Dr. 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  I have alerted radiation oncology.   We do not have a formal set of advanced directives although these have been previously discussed with the patient.  Both her parents and 5 siblings are alive in addition to her significant other.  I placing a consult to palliative care to help [place] initiate discussions with family members regarding goals of care.  I personally saw this patient and performed a substantive portion of this encounter with the listed APP documented above.   Chauncey Cruel, MD Medical Oncology and Hematology [facility] [address] [place], [place] [address] Tel. [phone]    Fax. [phone]   *Total Encounter Time as defined by the Centers for Medicare and Medicaid Services includes, in  addition to the face-to-face time of a patient visit (documented in the note above) non-face-to-face time: obtaining and reviewing outside history, ordering and reviewing medications, tests or procedures, care coordination (communications with other health care professionals or caregivers) and documentation in the medical record.

## 2020-03-11 NOTE — Telephone Encounter (Signed)
This RN spoke with pt per her call to [place] to cancel appointment.  Terri Estes states her issue today is anxiety - " just getting the better of me "  Note Terri Estes is very short of breath on the phone- having to pace her words with breathing.  This RN stated concern due to how she sounds over the phone and her current issues.  Terri Estes stated she does not feel like she needs to go to the local ER now- just not a good day to come to the office.  She states she is trying to use her grandma's oxygen but does not have all the supplies - and then she uses her daughter's oxygen at times as well.  She states she has incontinence with urine because she cannot get to the bathroom fast enough due to her oxygen - being short of breath.  She states bowels are normal.  Terri Estes stated she forgot about her appointment with [place] today.  She states she cannot get transportation at this time to come in.  This RN informed pt of need to be seen tomorrow for evaluation and  for documentation of oxygen needs so she can have supplies in the home.  Per anxiety - she was given clonazepam per discharge to help her sleep and decrease anxiety.  This RN suggested since she has above medication in the home to take it now and may use up to twice a day until she is seen tomorrow.  At visit tomorrow anxiety issues can be reviewed and appropriate medications ordered.  This RN again reiterated for her to proceed to the local ER or call 911 if her breathing gets worse.  Terri Estes verbalized understanding.  This RN scheduled pt for visit post radiation tomorrow.

## 2020-03-11 NOTE — ED Notes (Signed)
ED TO INPATIENT HANDOFF REPORT  Name/Age/Gender Terri Estes 34 y.o. female  Code Status Code Status History    Date Active Date Inactive Code Status Order ID Comments User Context   03/04/2020 2146 03/06/2020 2149 Full Code [id number]  Rise Patience, MD Inpatient   10/16/2019 1955 10/17/2019 1352 Full Code [id number]  Alphonsa Overall, MD Inpatient   08/02/2019 0111 08/02/2019 1258 Full Code [id number]  Delora Fuel, MD ED   08/01/2019 2245 08/02/2019 0111 Full Code [id number]  Deno Etienne, DO ED   09/23/2018 1728 09/26/2018 2158 Full Code [id number]  Florian Buff, MD Inpatient   07/09/2017 2242 07/12/2017 1736 Full Code [id number]  Seabron Spates, CNM Inpatient   07/07/2017 1838 07/09/2017 2242 Full Code [id number]  Degele, Jenne Pane, MD Inpatient   Advance Care Planning Activity      Home/SNF/Other Home  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]  Level of Care/Admitting Diagnosis ED Disposition    ED Disposition Condition [facility]: [facility] [id number]  Level of Care: Telemetry [5]  Admit to tele based on following criteria: Other see comments  Comments: hypoxia  May admit patient to [facility] or [facility] if equivalent level of care is available:: No  Covid Evaluation: Asymptomatic Screening Protocol (No Symptoms)  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 Anemia    after pregnancy  . Anxiety    severe not taking meds  . Asthma    inhaler  4x per day  . Cancer ([place])   . Chronic pain   . Depression   . Family history of breast cancer   . GERD (gastroesophageal reflux disease)   . History of substance  abuse ([place])   . Pregnancy induced hypertension    takes medication now.  . Psoriasis    all over body  . Trichomonas infection   . Vaginal Pap smear, abnormal     Allergies No Known Allergies  IV Location/Drains/Wounds Patient Lines/Drains/Airways Status   Active Line/Drains/Airways    Name:   Placement date:   Placement time:   Site:   Days:   Implanted Port 04/30/19 Left Chest   04/30/19    1319    Chest   316   Closed System Drain 1 Right;Medial Breast Bulb (JP) 19 Fr.   10/16/19    1620    Breast   147   Closed System Drain 2 Right;Lateral Breast Bulb (JP) 19 Fr.   10/16/19    1620    Breast   147   Incision (Closed) 07/10/17 Abdomen   07/10/17    2139     975   Incision (Closed) 07/10/17 Perineum   07/10/17    2139     975   Incision (Closed) 09/23/18 Abdomen   09/23/18    1334     535   Incision (Closed) 09/23/18 Perineum   09/23/18    1334     535   Incision (Closed) 04/30/19 Other (Comment)   04/30/19    1318     316   Incision (Closed) 10/16/19 Breast Right   10/16/19    1645     147  Labs/Imaging Results for orders placed or performed during the hospital encounter of 03/19/2020 (from the past 48 hour(s))  Basic metabolic panel     Status: Abnormal   Collection Time: 03/26/2020  5:50 PM  Result Value Ref Range   Sodium 136 135 - 145 mmol/L   Potassium 4.2 3.5 - 5.1 mmol/L   Chloride 98 98 - 111 mmol/L   CO2 29 22 - 32 mmol/L   Glucose, Bld 113 (H) 70 - 99 mg/dL    Comment: Glucose reference range applies only to samples taken after fasting for at least 8 hours.   BUN 19 6 - 20 mg/dL   Creatinine, Ser 0.51 0.44 - 1.00 mg/dL   Calcium 8.8 (L) 8.9 - 10.3 mg/dL   GFR calc non Af Amer >60 >60 mL/min   GFR calc Af Amer >60 >60 mL/min   Anion gap 9 5 - 15    Comment: Performed at [facility], [place] [address] [place], [place] [address]  Brain natriuretic peptide     Status: None   Collection Time: 03/12/2020  5:50 PM  Result Value Ref Range    B Natriuretic Peptide 43.0 0.0 - 100.0 pg/mL    Comment: Performed at [facility], [place] [address] [place], [place] [address]  CBC with Differential     Status: Abnormal   Collection Time: 03/26/2020  5:50 PM  Result Value Ref Range   WBC 8.2 4.0 - 10.5 K/uL   RBC 2.29 (L) 3.87 - 5.11 MIL/uL   Hemoglobin 7.2 (L) 12.0 - 15.0 g/dL   HCT 23.0 (L) 36.0 - 46.0 %   MCV 100.4 (H) 80.0 - 100.0 fL   MCH 31.4 26.0 - 34.0 pg   MCHC 31.3 30.0 - 36.0 g/dL   RDW 20.9 (H) 11.5 - 15.5 %   Platelets 90 (L) 150 - 400 K/uL    Comment: Immature Platelet Fraction may be clinically indicated, consider ordering this additional test [id number]    nRBC 2.6 (H) 0.0 - 0.2 %   Neutrophils Relative % 90 %   Neutro Abs 7.4 1.7 - 7.7 K/uL   Lymphocytes Relative 3 %   Lymphs Abs 0.2 (L) 0.7 - 4.0 K/uL   Monocytes Relative 5 %   Monocytes Absolute 0.4 0.1 - 1.0 K/uL   Eosinophils Relative 0 %   Eosinophils Absolute 0.0 0.0 - 0.5 K/uL   Basophils Relative 0 %   Basophils Absolute 0.0 0.0 - 0.1 K/uL   WBC Morphology MILD LEFT SHIFT (1-5% METAS, OCC MYELO, OCC BANDS)    Immature Granulocytes 2 %   Abs Immature Granulocytes 0.14 (H) 0.00 - 0.07 K/uL   Polychromasia PRESENT     Comment: Performed at [facility], [place] [address], [place], [place] [address]  Respiratory Panel by RT PCR (Flu A&B, Covid) - Nasopharyngeal Swab     Status: None   Collection Time: 03/15/2020  7:00 PM   Specimen: Nasopharyngeal Swab  Result Value Ref Range   SARS Coronavirus 2 by RT PCR NEGATIVE NEGATIVE    Comment: (NOTE) SARS-CoV-2 target nucleic acids are NOT DETECTED. The SARS-CoV-2 RNA is generally detectable in upper respiratoy specimens during the acute phase of infection. The lowest concentration of SARS-CoV-2 viral copies this assay can detect is 131 copies/mL. A negative result does not preclude SARS-Cov-2 infection and should not be used as the sole basis for treatment or other patient  management decisions. A negative result may occur with  improper specimen collection/handling, submission of specimen other than nasopharyngeal swab, presence of viral mutation(s) within the areas targeted by this assay, and inadequate number of viral copies (<131 copies/mL). A negative result must be combined with clinical observations, patient history, and epidemiological information. The expected result is Negative. Fact Sheet for Patients:  PinkCheek.be Fact Sheet for Healthcare Providers:  GravelBags.it This test is not yet ap proved or cleared by the [place] FDA and  has been authorized for detection and/or diagnosis of SARS-CoV-2 by FDA under an Emergency Use Authorization (EUA). This EUA will remain  in effect (meaning this test can be used) for the duration of the COVID-19 declaration under Section 564(b)(1) of the Act, 21 U.S.C. section 360bbb-3(b)(1), unless the authorization is terminated or revoked sooner.    Influenza A by PCR NEGATIVE NEGATIVE   Influenza B by PCR NEGATIVE NEGATIVE    Comment: (NOTE) The Xpert Xpress SARS-CoV-2/FLU/RSV assay is intended as an aid in  the diagnosis of influenza from Nasopharyngeal swab specimens and  should not be used as a sole basis for treatment. Nasal washings and  aspirates are unacceptable for Xpert Xpress SARS-CoV-2/FLU/RSV  testing. Fact Sheet for Patients: PinkCheek.be Fact Sheet for Healthcare Providers: GravelBags.it This test is not yet approved or cleared by the [place] FDA and  has been authorized for detection and/or diagnosis of SARS-CoV-2 by  FDA under an Emergency Use Authorization (EUA). This EUA will remain  in effect (meaning this test can be used) for the duration of the  Covid-19 declaration under Section 564(b)(1) of the Act, 21  U.S.C. section 360bbb-3(b)(1), unless the authorization  is  terminated or revoked. Performed at [facility], [place] [address] [place], [place] [address]    DG Chest Port 1 View  Result Date: 03/20/2020 CLINICAL DATA:  Shortness of breath EXAM: PORTABLE CHEST 1 VIEW COMPARISON:  03/05/2020 FINDINGS: Left-sided central venous port tip over the SVC. Completely opacified right thorax with minimal aeration at the right apex without change. Small left effusion with mild airspace disease at the left base. Mild diffuse interstitial opacities in the left thorax. Obscured cardiomediastinal silhouette. IMPRESSION: 1. Persistent large right pleural effusion with near complete opacification of right thorax. There is a small amount of aerated lung at the right apex 2. Probable left pleural effusion with left basilar atelectasis or pneumonia. Mild diffuse interstitial process in the left thorax may reflect edema or diffuse infection. Small lung nodules are also noted. Electronically Signed   By: Donavan Foil M.D.   On: 03/03/2020 18:06    Pending Labs Unresulted Labs (From admission, onward)    Start     Ordered   03/18/2020 1948  Prepare RBC  (Adult Blood Administration - Red Blood Cells)  Once,   R    Question Answer Comment  # of Units 2 units   Transfusion Indications Symptomatic Anemia   If emergent release call blood bank [facility] [id number]      03/01/2020 1947   03/19/2020 1936  Type and screen [facility]  Once,   STAT    Comments: [place]    03/15/2020 1935   Signed and Held  Comprehensive metabolic panel  Tomorrow morning,   R     Signed and Held   Signed and Held  CBC  Tomorrow morning,   R     Signed and Held          Vitals/Pain Today's Vitals   03/03/2020  1952 03/21/2020 2000 03/07/2020 2030 03/06/2020 2100  BP:  (!) 89/71 90/67 91/74   Pulse:  (!) 117 (!) 117 (!) 117  Resp:  (!) 25 (!) 24 13  Temp:      TempSrc:      SpO2:  100% 100% 100%  PainSc: 8        Isolation  Precautions No active isolations  Medications Medications  0.9 %  sodium chloride infusion (Manually program via Guardrails IV Fluids) (has no administration in time range)    Mobility walks with person assist

## 2020-03-11 NOTE — ED Notes (Signed)
Pt denies needing anything at this time when rounding on pt

## 2020-03-11 NOTE — H&P (Addendum)
History and Physical   Terri Estes UMP:[id number] DOB: 10-Apr-1986 DOA: 02/26/2020  Referring MD/NP/PA: Dr. Gilford Raid  PCP: Patient, No Pcp Per   Outpatient Specialists: Dr. Jana Hakim  Patient coming from: Home  Chief Complaint: Shortness of breath  HPI: Terri Estes is a 34 y.o. female with medical history significant of metastatic breast cancer, recurrent malignant pleural effusion, GERD, asthma, depression, chronic pain, psoriasis who was just discharged from the hospital on March 11 after admission from March 9 due to worsening shortness of breath.  Patient was found to have pulmonary embolism at the time with large right pleural effusion requiring thoracentesis on March 10.  At that point plan was to put a Pleurx catheter if this recurs.  Patient went for her follow-up with oncology today and was found to have worsening shortness of breath.  Her oxygen sats also dropped.  Repeat imaging now shows reaccumulation of her pleural effusion.  Patient could barely function work.  She was sent back to the ER for admission.  She denies any fever or chills.  She denied any nausea or vomiting.  She is generally unwell and feeling weak all over.Patient also has failed to thrive in the last months. Has severe Protein Calorie Malnutrition as a result of her Cancer and poor oral intake..  ED Course: Temperature is 98.7 blood pressure 89/71 pulse 132 respirate 34 oxygen sats 92% room air.  White count is 8.2 hemoglobin 7.2 and platelets 90.  Chemistry mostly within normal calcium 8.8 and glucose 113.  The 19th negative.  Chest x-ray shows persistent large right pleural effusion with near complete a prescription of right thorax.  Probably left pleural effusion with left basilar atelectasis or pneumonia.  Patient is readmitted to the hospital for treatment.  Review of Systems: As per HPI otherwise 10 point review of systems negative.    Past Medical History:  Diagnosis Date  . Anemia    after  pregnancy  . Anxiety    severe not taking meds  . Asthma    inhaler  4x per day  . Cancer ([place])   . Chronic pain   . Depression   . Family history of breast cancer   . GERD (gastroesophageal reflux disease)   . History of substance abuse ([place])   . Pregnancy induced hypertension    takes medication now.  . Psoriasis    all over body  . Trichomonas infection   . Vaginal Pap smear, abnormal     Past Surgical History:  Procedure Laterality Date  . CESAREAN SECTION  05/26/2008  . CESAREAN SECTION N/A 07/10/2017   Procedure: CESAREAN SECTION;  Surgeon: Florian Buff, MD;  Location: [facility];  Service: Obstetrics;  Laterality: N/A;  . CESAREAN SECTION N/A 09/23/2018   Procedure: CESAREAN SECTION;  Surgeon: Donnamae Jude, MD;  Location: [facility];  Service: Obstetrics;  Laterality: N/A;  . COLPOSCOPY    . MASTECTOMY WITH AXILLARY LYMPH NODE DISSECTION Right 10/16/2019   Procedure: RIGHT MASTECTOMY WITH AXILLARY LYMPH NODE DISSECTION;  Surgeon: Alphonsa Overall, MD;  Location: [facility];  Service: General;  Laterality: Right;  . PORTACATH PLACEMENT Left 04/30/2019   Procedure: INSERTION PORT-A-CATH WITH ULTRASOUND;  Surgeon: Alphonsa Overall, MD;  Location: [facility];  Service: General;  Laterality: Left;  Left Subclavian vein  . WISDOM TOOTH EXTRACTION       reports that she has been smoking cigarettes. She has been smoking about 0.10 packs per day for the past 0.00 years.  She has never used smokeless tobacco. She reports current drug use. Frequency: 7.00 times per week. Drug: Marijuana. She reports that she does not drink alcohol.  No Known Allergies  Family History  Problem Relation Age of Onset  . Kidney disease Maternal Grandmother   . Breast cancer Maternal Grandmother 28       metastatic to liver; d. 89  . Kidney disease Maternal Grandfather   . Kidney disease Paternal Grandmother   . Diabetes Paternal Grandmother   . Breast cancer Paternal Grandmother   . Kidney  disease Paternal Grandfather   . Breast cancer Paternal Aunt   . Cerebral palsy Child   . Breast cancer Paternal Great-grandmother   . Pancreatic cancer Other        PGMs brother   . Throat cancer Paternal Uncle   . Breast cancer Other        PGMs sister     Prior to Admission medications   Medication Sig Start Date End Date Taking? Authorizing Provider  albuterol (VENTOLIN HFA) 108 (90 Base) MCG/ACT inhaler Inhale 2 puffs into the lungs every 6 (six) hours as needed for wheezing or shortness of breath. 02/15/20  Yes Magrinat, [name], MD  benzonatate (TESSALON) 100 MG capsule Take 1 capsule (100 mg total) by mouth 3 (three) times daily as needed for cough. 03/06/20  Yes Rai, Ripudeep K, MD  buprenorphine (SUBUTEX) 8 MG SUBL SL tablet Place 8 mg under the tongue 2 (two) times daily.    Yes [provider]  capecitabine (XELODA) 500 MG tablet Take 2 tablets (1,000 mg total) by mouth 2 (two) times daily after a meal. Take Monday-Friday. Take only on days of radiation. 02/08/20  Yes Magrinat, [name], MD  cholestyramine (QUESTRAN) 4 g packet Take 1 packet (4 g total) by mouth 3 (three) times daily with meals. Patient taking differently: Take 4 g by mouth 3 (three) times daily with meals as needed (diarrhea).  05/11/19  Yes Magrinat, [name], MD  clonazePAM (KLONOPIN) 1 MG tablet Take 1 tablet (1 mg total) by mouth at bedtime as needed for anxiety. 03/06/20 03/06/21 Yes Rai, Ripudeep K, MD  cyclobenzaprine (FLEXERIL) 10 MG tablet Take 1 tablet (10 mg total) by mouth 3 (three) times daily as needed for muscle spasms. 09/26/18  Yes Donnamae Jude, MD  dexamethasone (DECADRON) 4 MG tablet Take 1 tablet (4 mg total) by mouth daily with breakfast. 02/15/20  Yes Magrinat, [name], MD  Famotidine (PEPCID PO) Take 1 tablet by mouth daily.   Yes [provider]  gabapentin (NEURONTIN) 300 MG capsule TAKE 1 CAPSULE(300 MG) BY MOUTH THREE TIMES DAILY Patient taking differently: Take 300 mg by  mouth 3 (three) times daily.  09/10/19  Yes Causey, Charlestine Massed, NP  guaiFENesin 200 MG tablet Take 200 mg by mouth every 4 (four) hours as needed for cough or to loosen phlegm.   Yes [provider]  HYDROcodone-homatropine (HYCODAN) 5-1.5 MG/5ML syrup Take 5 mLs by mouth every 6 (six) hours as needed for cough. 03/06/20  Yes Rai, Ripudeep K, MD  metoCLOPramide (REGLAN) 10 MG tablet Take 1 tablet (10 mg total) by mouth every 8 (eight) hours as needed for nausea. 11/29/19  Yes Caccavale, Sophia, PA-C  ondansetron (ZOFRAN) 8 MG tablet Take 1 tablet (8 mg total) by mouth every 8 (eight) hours as needed for nausea or vomiting. 11/21/19  Yes Magrinat, [name], MD  oxyCODONE (OXY IR/ROXICODONE) 5 MG immediate release tablet Take 1  tablet (5 mg total) by mouth every 6 (six) hours as needed for severe pain. 02/29/20  Yes Kyung Rudd, MD  Rivaroxaban 15 & 20 MG TBPK Follow package directions: Take one 60m tablet by mouth twice a day. On day 22 (03/26/2020), switch to one 235mtablet once a day. Take with food. 03/06/20  Yes Rai, Ripudeep K, MD  valACYclovir (VALTREX) 1000 MG tablet Take 1 tablet (1,000 mg total) by mouth 2 (two) times daily. Patient taking differently: Take 1,000 mg by mouth 2 (two) times daily as needed (viral outbreak).  06/26/19  Yes Causey, LiCharlestine MassedNP  albuterol (VENTOLIN HFA) 108 (90 Base) MCG/ACT inhaler Inhale 2 puffs into the lungs 4 (four) times daily. Patient not taking: Reported on 03/10/2020 02/15/20   Magrinat, GuVirgie DadMD  rivaroxaban (XARELTO) 20 MG TABS tablet Take 1 tablet (20 mg total) by mouth daily with supper. Please start after the starter pack has been completed 03/26/20   Rai, Ripudeep K, MD  enalapril (VASOTEC) 10 MG tablet TAKE 1 TABLET BY MOUTH DAILY Patient taking differently: Take 10 mg by mouth daily.  03/15/19   PrDonnamae JudeMD    Physical Exam: Vitals:   0304/03/2021130 032021-04-03145 032021-04-03200 032021/04/03423  BP:   97/62   Pulse:        Resp: 14 (!) 35 (!) 28   Temp:      TempSrc:      SpO2:      Weight:    69.2 kg  Height:    _0  (1.575 m)      Constitutional: Chronically ill looking, mild respiratory distress Vitals:   0304-03-21130 [id number] 032021/04/03200 032021-04-03423  BP:   97/62   Pulse:      Resp: 14 (!) 35 (!) 28   Temp:      TempSrc:      SpO2:      Weight:    69.2 kg  Height:    _1  (1.575 m)   Eyes: PERRL, lids and conjunctivae normal ENMT: Mucous membranes are moist. Posterior pharynx clear of any exudate or lesions.Normal dentition.  Neck: normal, supple, no masses, no thyromegaly Respiratory: Decreased air entry completely on the right side, mild crackles all throughout, no wheezing no rales.  Cardiovascular: Sinus tachycardia, no murmurs / rubs / gallops. No extremity edema. 2+ pedal pulses. No carotid bruits.  Abdomen: no tenderness, no masses palpated. No hepatosplenomegaly. Bowel sounds positive.  Musculoskeletal: no clubbing / cyanosis. No joint deformity upper and lower extremities. Good ROM, no contractures. Normal muscle tone.  Skin: no rashes, lesions, ulcers. No induration Neurologic: CN 2-12 grossly intact. Sensation intact, DTR normal. Strength 5/5 in all 4.  Psychiatric: Normal judgment and insight. Alert and oriented x 3. Normal mood.     Labs on Admission: I have personally reviewed following labs and imaging studies  CBC: Recent Labs  Lab 03/15/20 0553 03/15/20 1334 [id number]  WBC 5.4  --  4.2  NEUTROABS  --   --  3.7  HGB 6.6* 8.1* 7.0*  HCT 21.8* 25.8* 23.4*  MCV 103.8*  --  100.9*  PLT 28*  --  14*   Basic Metabolic Panel: Recent Labs  Lab 03/15/20 0553 [id number]  NA 137 138  K 3.7 4.3  CL 102 105  CO2 26 24  GLUCOSE 168* 177*  BUN 12 28*  CREATININE 0.53 0.98  CALCIUM 8.3* 7.2*  MG 1.8 1.7  PHOS  2.6  --    GFR: Estimated Creatinine Clearance: 75.8 mL/min (by C-G formula based on SCr of 0.98 mg/dL). Liver Function Tests: Recent  Labs  Lab 04-01-20 0338  AST 54*  ALT 27  ALKPHOS 70  BILITOT 1.2  PROT 5.1*  ALBUMIN 2.4*   No results for input(s): LIPASE, AMYLASE in the last 168 hours. No results for input(s): AMMONIA in the last 168 hours. Coagulation Profile: Recent Labs  Lab 2020-04-01 0707  INR 1.9*   Cardiac Enzymes: No results for input(s): CKTOTAL, CKMB, CKMBINDEX, TROPONINI in the last 168 hours. BNP (last 3 results) No results for input(s): PROBNP in the last 8760 hours. HbA1C: No results for input(s): HGBA1C in the last 72 hours. CBG: Recent Labs  Lab 03/15/20 1612 03/15/20 1940 03/15/20 2347 04/01/2020 0343 2020/04/01 0813  GLUCAP 114* 96 105* 89 90   Lipid Profile: No results for input(s): CHOL, HDL, LDLCALC, TRIG, CHOLHDL, LDLDIRECT in the last 72 hours. Thyroid Function Tests: No results for input(s): TSH, T4TOTAL, FREET4, T3FREE, THYROIDAB in the last 72 hours. Anemia Panel: No results for input(s): VITAMINB12, FOLATE, FERRITIN, TIBC, IRON, RETICCTPCT in the last 72 hours. Urine analysis:    Component Value Date/Time   COLORURINE YELLOW 03/15/2020 1151   APPEARANCEUR CLEAR 03/15/2020 1151   LABSPEC 1.026 03/15/2020 1151   PHURINE 5.0 03/15/2020 1151   GLUCOSEU NEGATIVE 03/15/2020 1151   HGBUR NEGATIVE 03/15/2020 1151   BILIRUBINUR NEGATIVE 03/15/2020 1151   KETONESUR NEGATIVE 03/15/2020 1151   PROTEINUR 100 (A) 03/15/2020 1151   UROBILINOGEN 4.0 (H) 01/13/2018 1417   NITRITE NEGATIVE 03/15/2020 1151   LEUKOCYTESUR NEGATIVE 03/15/2020 1151   Sepsis Labs: _0 (procalcitonin:4,lacticidven:4) ) Recent Results (from the past 240 hour(s))  Respiratory Panel by RT PCR (Flu A&B, Covid) - Nasopharyngeal Swab     Status: None   Collection Time: 03/07/2020  7:00 PM   Specimen: Nasopharyngeal Swab  Result Value Ref Range Status   SARS Coronavirus 2 by RT PCR NEGATIVE NEGATIVE Final    Comment: (NOTE) SARS-CoV-2 target nucleic acids are NOT DETECTED. The SARS-CoV-2 RNA is  generally detectable in upper respiratoy specimens during the acute phase of infection. The lowest concentration of SARS-CoV-2 viral copies this assay can detect is 131 copies/mL. A negative result does not preclude SARS-Cov-2 infection and should not be used as the sole basis for treatment or other patient management decisions. A negative result may occur with  improper specimen collection/handling, submission of specimen other than nasopharyngeal swab, presence of viral mutation(s) within the areas targeted by this assay, and inadequate number of viral copies (<131 copies/mL). A negative result must be combined with clinical observations, patient history, and epidemiological information. The expected result is Negative. Fact Sheet for Patients:  PinkCheek.be Fact Sheet for Healthcare Providers:  GravelBags.it This test is not yet ap proved or cleared by the [place] FDA and  has been authorized for detection and/or diagnosis of SARS-CoV-2 by FDA under an Emergency Use Authorization (EUA). This EUA will remain  in effect (meaning this test can be used) for the duration of the COVID-19 declaration under Section 564(b)(1) of the Act, 21 U.S.C. section 360bbb-3(b)(1), unless the authorization is terminated or revoked sooner.    Influenza A by PCR NEGATIVE NEGATIVE Final   Influenza B by PCR NEGATIVE NEGATIVE Final    Comment: (NOTE) The Xpert Xpress SARS-CoV-2/FLU/RSV assay is intended as an aid in  the diagnosis of influenza from Nasopharyngeal swab specimens and  should not be used as  a sole basis for treatment. Nasal washings and  aspirates are unacceptable for Xpert Xpress SARS-CoV-2/FLU/RSV  testing. Fact Sheet for Patients: PinkCheek.be Fact Sheet for Healthcare Providers: GravelBags.it This test is not yet approved or cleared by the [place] FDA and    has been authorized for detection and/or diagnosis of SARS-CoV-2 by  FDA under an Emergency Use Authorization (EUA). This EUA will remain  in effect (meaning this test can be used) for the duration of the  Covid-19 declaration under Section 564(b)(1) of the Act, 21  U.S.C. section 360bbb-3(b)(1), unless the authorization is  terminated or revoked. Performed at [facility], [place] [address], [place], [place] [address]   MRSA PCR Screening     Status: None   Collection Time: 03/13/20  4:48 AM   Specimen: Nasal Mucosa; Nasopharyngeal  Result Value Ref Range Status   MRSA by PCR NEGATIVE NEGATIVE Final    Comment:        The GeneXpert MRSA Assay (FDA approved for NASAL specimens only), is one component of a comprehensive MRSA colonization surveillance program. It is not intended to diagnose MRSA infection nor to guide or monitor treatment for MRSA infections. Performed at [facility], [place] [address], [place], [place] [address]   Culture, respiratory     Status: None   Collection Time: 03/13/20  2:19 PM   Specimen: Bronchoalveolar Lavage; Respiratory  Result Value Ref Range Status   Specimen Description   Final    BRONCHIAL ALVEOLAR LAVAGE Performed at [facility] [address] [place], [place] [address]    Special Requests   Final    NONE Performed at [facility], [place] [address] [place], [place] [address]    Gram Stain   Final    FEW WBC PRESENT, PREDOMINANTLY PMN RARE YEAST Performed at [facility], [place] [address], [place], [place] [address]    Culture FEW Consistent with normal respiratory flora.  Final   Report Status 03/31/2020 FINAL  Final  Culture, respiratory (non-expectorated)     Status: None   Collection Time: 03/15/20 10:55 AM   Specimen: Tracheal Aspirate; Respiratory  Result Value Ref Range Status   Specimen Description   Final    TRACHEAL ASPIRATE Performed at [facility] [address], [place], [place] [address]    Special Requests   Final    Normal Performed at [facility], [place] [address], [place], [place] [address]    Gram Stain   Final    RARE WBC PRESENT,BOTH PMN AND MONONUCLEAR RARE GRAM POSITIVE COCCI IN PAIRS RARE GRAM POSITIVE RODS    Culture   Final    Consistent with normal respiratory flora. Performed at [facility], [place] [address], [place], [place] [address]    Report Status 03/17/2020 FINAL  Final     Radiological Exams on Admission: No results found.    Assessment/Plan Principal Problem:   Malignant pleural effusion Active Problems:   Acute blood loss anemia   Acute respiratory failure (HCC)   Pulmonary embolism (HCC)   Symptomatic anemia   Shortness of breath at rest   Protein calorie malnutrition (HCC)   FTT (failure to thrive) in adult     #1 malignant pleural effusion: Recurrent.  Suspected malignant effusion versus hemothorax.  Patient has lost about 3 g of hemoglobin in the last few days.  No evidence of weight may have gone.  Worried about this  being hemothorax especially patient just started on Xarelto.  Will consult IR for repeat thoracentesis to see what was going on. D/W Dr. Sedalia Muta interventional radiology.    #2 acute anemia: Patient's hemoglobin went from 10 g to 7 g in 5 days.  No evidence of bleed anywhere.  Worrisome for her right pleural effusion being actually blood.  We will await the thoracentesis and if is not bloody we will have to do CT abdomen pelvis to look for retroperitoneal hematoma.  In the meantime I will hold anticoagulation briefly despite patient having PE.  May resume if no evidence of bleeding after thoracentesis.  We will transfuse 2 units of packed red blood cells  #3 malignant breast cancer: Continue per Dr. Jana Hakim  #4 recent pulmonary embolism: Again on Xarelto.  May hold it temporarily until thoracentesis is done.  #5 Adult  Failure to Thrive: Due to Cancer and poor appetite. Has been unable to lead regular life. Will get SW consult.  #6 Severe PCM: Due to advance Malignancy. Get Nutrition counseling   DVT prophylaxis: SCD Code Status: Full code Family Communication: Discussed care with the patient Disposition Plan: To be determined Consults called: Interventional radiology.  Discussed with Dr. Pascal Lux Admission status: Inpatient  Severity of Illness: The appropriate patient status for this patient is INPATIENT. Inpatient status is judged to be reasonable and necessary in order to provide the required intensity of service to ensure the patient's safety. The patient's presenting symptoms, physical exam findings, and initial radiographic and laboratory data in the context of their chronic comorbidities is felt to place them at high risk for further clinical deterioration. Furthermore, it is not anticipated that the patient will be medically stable for discharge from the hospital within 2 midnights of admission. The following factors support the patient status of inpatient.   " The patient's presenting symptoms include shortness of breath. " The worrisome physical exam findings include poor air entry on the right. " The initial radiographic and laboratory data are worrisome because of significant anemia and x-ray findings of right pleural effusion. " The chronic co-morbidities include malignant breast cancer.   * I certify that at the point of admission it is my clinical judgment that the patient will require inpatient hospital care spanning beyond 2 midnights from the point of admission due to high intensity of service, high risk for further deterioration and high frequency of surveillance required.Barbette Merino MD [facility] Pager [phone]  If 7PM-7AM, please contact night-coverage [email or web address] Password [facility]  03/21/2020, 2:55 PM

## 2020-03-11 NOTE — ED Notes (Signed)
MD attempted to collect occult blood sample and explained the purpose of exam. Pt refused.

## 2020-03-11 NOTE — ED Provider Notes (Signed)
[facility] DEPT Provider Note   CSN: [id number] Arrival date & time: 03/18/2020  1650     History Chief Complaint  Patient presents with  . Shortness of Breath    Terri Estes is a 34 y.o. female.  Pt presents to the ED with sob.  Pt is an unfortunate 34 yo female who has a hx of metastatic breast cancer.  Pt has a hx of a right lung malignant pleural effusion that was last drained on 3/10.  She was admitted from 3/9-11 for PE.  She was started on Xarelto.  PT said she has been compliant with her meds.  No fevers.  She was sent home without oxygen because she did not qualify.  She has been using her grandmother's oxygen at home and is still feeling sob.            Past Medical History:  Diagnosis Date  . Anemia    after pregnancy  . Anxiety    severe not taking meds  . Asthma    inhaler  4x per day  . Cancer ([place])   . Chronic pain   . Depression   . Family history of breast cancer   . GERD (gastroesophageal reflux disease)   . History of substance abuse ([place])   . Pregnancy induced hypertension    takes medication now.  . Psoriasis    all over body  . Trichomonas infection   . Vaginal Pap smear, abnormal     Patient Active Problem List   Diagnosis Date Noted  . Lung metastases ([place]) 03/06/2020  . Liver metastases ([place]) 03/06/2020  . Tachypnea   . 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 Acute respiratory failure ([place]) 03/04/2020  . Pulmonary embolism ([place]) 03/04/2020  . Acute respiratory failure with hypoxia ([place]) 03/04/2020  . 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 Encounter for antineoplastic immunotherapy 08/22/2019  . 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 Genetic testing 05/14/2019  . Family history of breast cancer   . Malignant neoplasm of overlapping sites of right breast in female, estrogen receptor negative ([place]) 04/25/2019  . Acute kidney injury ([place]) 09/29/2018  . Abruptio placentae  syndrome, third trimester 09/23/2018  . Acute blood loss anemia 09/23/2018  . DIC (disseminated intravascular coagulation) ([place]) 09/23/2018  . S/P cesarean section 09/23/2018  . Inadequate social support 07/21/2018  . Sciatica 07/21/2018  . Abdominal muscle strain, initial encounter 07/21/2018  . History of substance abuse ([place]) 06/12/2018  . Supervision of other normal pregnancy, antepartum 05/10/2018  . Short interval between pregnancies affecting pregnancy, antepartum 05/10/2018  . Hx of preeclampsia, prior pregnancy, currently pregnant 05/10/2018  . History of preterm delivery, currently pregnant 05/10/2018  . Depression affecting pregnancy, antepartum 05/10/2018  . HSIL (high grade squamous intraepithelial lesion) on Pap smear of cervix 12/24/2016  . History of cesarean delivery 12/16/2016    Past Surgical History:  Procedure Laterality Date  . CESAREAN SECTION  05/26/2008  . CESAREAN SECTION N/A 07/10/2017   Procedure: CESAREAN SECTION;  Surgeon: Florian Buff, MD;  Location: [facility];  Service: Obstetrics;  Laterality: N/A;  . CESAREAN SECTION N/A 09/23/2018   Procedure: CESAREAN SECTION;  Surgeon: Donnamae Jude, MD;  Location: [facility];  Service: Obstetrics;  Laterality: N/A;  . COLPOSCOPY    . MASTECTOMY WITH AXILLARY LYMPH NODE DISSECTION Right 10/16/2019   Procedure: RIGHT MASTECTOMY WITH AXILLARY LYMPH NODE DISSECTION;  Surgeon: Alphonsa Overall, MD;  Location: [facility];  Service:  General;  Laterality: Right;  . PORTACATH PLACEMENT Left 04/30/2019   Procedure: INSERTION PORT-A-CATH WITH ULTRASOUND;  Surgeon: Alphonsa Overall, MD;  Location: [facility];  Service: General;  Laterality: Left;  Left Subclavian vein  . WISDOM TOOTH EXTRACTION       OB History    Gravida  6   Para  3   Term  1   Preterm  2   AB  3   Living  3     SAB      TAB  3   Ectopic      Multiple  0   Live Births  3           Family History  Problem Relation Age of Onset    . Kidney disease Maternal Grandmother   . Breast cancer Maternal Grandmother 28       metastatic to liver; d. 53  . Kidney disease Maternal Grandfather   . Kidney disease Paternal Grandmother   . Diabetes Paternal Grandmother   . Breast cancer Paternal Grandmother   . Kidney disease Paternal Grandfather   . Breast cancer Paternal Aunt   . Cerebral palsy Child   . Breast cancer Paternal Great-grandmother   . Pancreatic cancer Other        PGMs brother   . Throat cancer Paternal Uncle   . Breast cancer Other        PGMs sister    Social History   Tobacco Use  . Smoking status: Current Every Day Smoker    Packs/day: 0.10    Years: 0.00    Pack years: 0.00    Types: Cigarettes  . Smokeless tobacco: Never Used  . Tobacco comment: trying to quit - smokes 2 cigarettes/day  Substance Use Topics  . Alcohol use: No  . Drug use: Yes    Frequency: 7.0 times per week    Types: Marijuana    Comment: subutex last dose 10/15/19, last dose Sun 10/14/19    Home Medications Prior to Admission medications   Medication Sig Start Date End Date Taking? Authorizing Provider  albuterol (VENTOLIN HFA) 108 (90 Base) MCG/ACT inhaler Inhale 2 puffs into the lungs every 6 (six) hours as needed for wheezing or shortness of breath. 02/15/20  Yes Magrinat, [name], MD  benzonatate (TESSALON) 100 MG capsule Take 1 capsule (100 mg total) by mouth 3 (three) times daily as needed for cough. 03/06/20  Yes Rai, Ripudeep K, MD  buprenorphine (SUBUTEX) 8 MG SUBL SL tablet Place 8 mg under the tongue 2 (two) times daily.    Yes [provider]  capecitabine (XELODA) 500 MG tablet Take 2 tablets (1,000 mg total) by mouth 2 (two) times daily after a meal. Take Monday-Friday. Take only on days of radiation. 02/08/20  Yes Magrinat, [name], MD  cholestyramine (QUESTRAN) 4 g packet Take 1 packet (4 g total) by mouth 3 (three) times daily with meals. Patient taking differently: Take 4 g by mouth 3 (three)  times daily with meals as needed (diarrhea).  05/11/19  Yes Magrinat, [name], MD  clonazePAM (KLONOPIN) 1 MG tablet Take 1 tablet (1 mg total) by mouth at bedtime as needed for anxiety. 03/06/20 03/06/21 Yes Rai, Ripudeep K, MD  cyclobenzaprine (FLEXERIL) 10 MG tablet Take 1 tablet (10 mg total) by mouth 3 (three) times daily as needed for muscle spasms. 09/26/18  Yes Donnamae Jude, MD  dexamethasone (DECADRON) 4 MG tablet Take 1 tablet (4 mg total) by mouth  daily with breakfast. 02/15/20  Yes Magrinat, [name], MD  Famotidine (PEPCID PO) Take 1 tablet by mouth daily.   Yes [provider]  gabapentin (NEURONTIN) 300 MG capsule TAKE 1 CAPSULE(300 MG) BY MOUTH THREE TIMES DAILY Patient taking differently: Take 300 mg by mouth 3 (three) times daily.  09/10/19  Yes Causey, Charlestine Massed, NP  guaiFENesin 200 MG tablet Take 200 mg by mouth every 4 (four) hours as needed for cough or to loosen phlegm.   Yes [provider]  HYDROcodone-homatropine (HYCODAN) 5-1.5 MG/5ML syrup Take 5 mLs by mouth every 6 (six) hours as needed for cough. 03/06/20  Yes Rai, Ripudeep K, MD  metoCLOPramide (REGLAN) 10 MG tablet Take 1 tablet (10 mg total) by mouth every 8 (eight) hours as needed for nausea. 11/29/19  Yes Caccavale, Sophia, PA-C  ondansetron (ZOFRAN) 8 MG tablet Take 1 tablet (8 mg total) by mouth every 8 (eight) hours as needed for nausea or vomiting. 11/21/19  Yes Magrinat, [name], MD  oxyCODONE (OXY IR/ROXICODONE) 5 MG immediate release tablet Take 1 tablet (5 mg total) by mouth every 6 (six) hours as needed for severe pain. 02/29/20  Yes Kyung Rudd, MD  Rivaroxaban 15 & 20 MG TBPK Follow package directions: Take one 15mg  tablet by mouth twice a day. On day 22 (03/26/2020), switch to one 20mg  tablet once a day. Take with food. 03/06/20  Yes Rai, Ripudeep K, MD  valACYclovir (VALTREX) 1000 MG tablet Take 1 tablet (1,000 mg total) by mouth 2 (two) times daily. Patient taking differently: Take  1,000 mg by mouth 2 (two) times daily as needed (viral outbreak).  06/26/19  Yes Causey, Charlestine Massed, NP  albuterol (VENTOLIN HFA) 108 (90 Base) MCG/ACT inhaler Inhale 2 puffs into the lungs 4 (four) times daily. Patient not taking: Reported on 02/25/2020 02/15/20   Magrinat, [name], MD  rivaroxaban (XARELTO) 20 MG TABS tablet Take 1 tablet (20 mg total) by mouth daily with supper. Please start after the starter pack has been completed 03/26/20   Rai, Ripudeep K, MD  enalapril (VASOTEC) 10 MG tablet TAKE 1 TABLET BY MOUTH DAILY Patient taking differently: Take 10 mg by mouth daily.  03/15/19   Donnamae Jude, MD    Allergies    Patient has no known allergies.  Review of Systems   Review of Systems  Respiratory: Positive for shortness of breath.   All other systems reviewed and are negative.   Physical Exam Updated Vital Signs BP 101/62   Pulse (!) 119   Temp 98.4 F (36.9 C) (Oral)   Resp 20   SpO2 100%   Physical Exam Vitals and nursing note reviewed. Exam conducted with a chaperone present.  Constitutional:      General: She is in acute distress.  HENT:     Head: Normocephalic and atraumatic.     Mouth/Throat:     Mouth: Mucous membranes are moist.  Eyes:     Extraocular Movements: Extraocular movements intact.     Pupils: Pupils are equal, round, and reactive to light.  Cardiovascular:     Rate and Rhythm: Regular rhythm. Tachycardia present.  Pulmonary:     Effort: Tachypnea present.     Breath sounds: Wheezing present.  Chest:     Comments: Right sided mastectomy with radiation skin burns Abdominal:     General: Bowel sounds are normal.     Palpations: Abdomen is soft.  Genitourinary:    Comments: Pt REFUSED rectal exam.  Musculoskeletal:        General: Normal range of motion.     Cervical back: Normal range of motion and neck supple.  Skin:    General: Skin is warm.     Capillary Refill: Capillary refill takes less than 2 seconds.     Comments:  Widespread psoriasis  Neurological:     General: No focal deficit present.     Mental Status: She is alert and oriented to person, place, and time.  Psychiatric:        Mood and Affect: Mood normal.        Behavior: Behavior normal.     ED Results / Procedures / Treatments   Labs (all labs ordered are listed, but only abnormal results are displayed) Labs Reviewed  BASIC METABOLIC PANEL - Abnormal; Notable for the following components:      Result Value   Glucose, Bld 113 (*)    Calcium 8.8 (*)    All other components within normal limits  CBC WITH DIFFERENTIAL/PLATELET - Abnormal; Notable for the following components:   RBC 2.29 (*)    Hemoglobin 7.2 (*)    HCT 23.0 (*)    MCV 100.4 (*)    RDW 20.9 (*)    Platelets 90 (*)    nRBC 2.6 (*)    Lymphs Abs 0.2 (*)    Abs Immature Granulocytes 0.14 (*)    All other components within normal limits  RESPIRATORY PANEL BY RT PCR (FLU A&B, COVID)  BRAIN NATRIURETIC PEPTIDE  POC OCCULT BLOOD, ED  TYPE AND SCREEN  PREPARE RBC (CROSSMATCH)    EKG EKG Interpretation  Date/Time:  Tuesday March 11 2020 17:10:03 EDT Ventricular Rate:  124 PR Interval:    QRS Duration: 74 QT Interval:  318 QTC Calculation: 457 R Axis:   120 Text Interpretation: Sinus tachycardia Ventricular premature complex Aberrant complex Right axis deviation No significant change since last tracing Confirmed by Isla Pence [phone]) on 03/06/2020 5:16:20 PM   Radiology DG Chest Port 1 View  Result Date: 03/14/2020 CLINICAL DATA:  Shortness of breath EXAM: PORTABLE CHEST 1 VIEW COMPARISON:  03/05/2020 FINDINGS: Left-sided central venous port tip over the SVC. Completely opacified right thorax with minimal aeration at the right apex without change. Small left effusion with mild airspace disease at the left base. Mild diffuse interstitial opacities in the left thorax. Obscured cardiomediastinal silhouette. IMPRESSION: 1. Persistent large right pleural effusion  with near complete opacification of right thorax. There is a small amount of aerated lung at the right apex 2. Probable left pleural effusion with left basilar atelectasis or pneumonia. Mild diffuse interstitial process in the left thorax may reflect edema or diffuse infection. Small lung nodules are also noted. Electronically Signed   By: Donavan Foil M.D.   On: 03/13/2020 18:06    Procedures Procedures (including critical care time)  Medications Ordered in ED Medications  0.9 %  sodium chloride infusion (Manually program via Guardrails IV Fluids) (has no administration in time range)    ED Course  I have reviewed the triage vital signs and the nursing notes.  Pertinent labs & imaging results that were available during my care of the patient were reviewed by me and considered in my medical decision making (see chart for details).    MDM Rules/Calculators/A&P                     Pt's RA oxygenation is good, but she is placed on 2L oxygen  for comfort.  This has helped her be a little more comfortable.  Pt refused to have a rectal exam.  If she does have a bowel movement, I told the nurse to check it for blood.  Pt is symptomatic from anemia, so 2 units of blood were ordered for transfusion.  Right pleural effusion has recurred in just a few days.  IR has talked with pt about Pleurx catheter.  This may need to be done while admitted.  Pt d/w Dr. Jonelle Sidle (triad) for admission.  CRITICAL CARE Performed by: Isla Pence   Total critical care time: 30 minutes  Critical care time was exclusive of separately billable procedures and treating other patients.  Critical care was necessary to treat or prevent imminent or life-threatening deterioration.  Critical care was time spent personally by me on the following activities: development of treatment plan with patient and/or surrogate as well as nursing, discussions with consultants, evaluation of patient's response to treatment,  examination of patient, obtaining history from patient or surrogate, ordering and performing treatments and interventions, ordering and review of laboratory studies, ordering and review of radiographic studies, pulse oximetry and re-evaluation of patient's condition.  Final Clinical Impression(s) / ED Diagnoses Final diagnoses:  Recurrent pleural effusion on right  Metastatic breast cancer ([place])  Symptomatic anemia  Acute respiratory distress    Rx / DC Orders ED Discharge Orders    None       Isla Pence, MD 03/03/2020 1950

## 2020-03-12 ENCOUNTER — Ambulatory Visit: Payer: Medicaid Other | Admitting: Adult Health

## 2020-03-12 ENCOUNTER — Ambulatory Visit: Payer: Medicaid Other

## 2020-03-12 ENCOUNTER — Other Ambulatory Visit: Payer: Medicaid Other

## 2020-03-12 ENCOUNTER — Encounter: Payer: Self-pay | Admitting: *Deleted

## 2020-03-12 DIAGNOSIS — J910 Malignant pleural effusion: Secondary | ICD-10-CM

## 2020-03-12 LAB — HEMOGLOBIN AND HEMATOCRIT, BLOOD
HCT: 24.2 % — ABNORMAL LOW (ref 36.0–46.0)
Hemoglobin: 7.5 g/dL — ABNORMAL LOW (ref 12.0–15.0)

## 2020-03-12 LAB — COMPREHENSIVE METABOLIC PANEL
ALT: 36 U/L (ref 0–44)
AST: 39 U/L (ref 15–41)
Albumin: 2.5 g/dL — ABNORMAL LOW (ref 3.5–5.0)
Alkaline Phosphatase: 89 U/L (ref 38–126)
Anion gap: 10 (ref 5–15)
BUN: 17 mg/dL (ref 6–20)
CO2: 29 mmol/L (ref 22–32)
Calcium: 8.8 mg/dL — ABNORMAL LOW (ref 8.9–10.3)
Chloride: 95 mmol/L — ABNORMAL LOW (ref 98–111)
Creatinine, Ser: 0.69 mg/dL (ref 0.44–1.00)
GFR calc Af Amer: >60 mL/min (ref >60)
GFR calc non Af Amer: >60 mL/min (ref >60)
Glucose, Bld: 103 mg/dL — ABNORMAL HIGH (ref 70–99)
Potassium: 3.8 mmol/L (ref 3.5–5.1)
Sodium: 134 mmol/L — ABNORMAL LOW (ref 135–145)
Total Bilirubin: 0.9 mg/dL (ref 0.3–1.2)
Total Protein: 5.9 g/dL — ABNORMAL LOW (ref 6.5–8.1)

## 2020-03-12 LAB — CBC
HCT: 27.6 % — ABNORMAL LOW (ref 36.0–46.0)
Hemoglobin: 8.6 g/dL — ABNORMAL LOW (ref 12.0–15.0)
MCH: 30.7 pg (ref 26.0–34.0)
MCHC: 31.2 g/dL (ref 30.0–36.0)
MCV: 98.6 fL (ref 80.0–100.0)
Platelets: 67 10*3/uL — ABNORMAL LOW (ref 150–400)
RBC: 2.8 MIL/uL — ABNORMAL LOW (ref 3.87–5.11)
RDW: 19.2 % — ABNORMAL HIGH (ref 11.5–15.5)
WBC: 7.9 10*3/uL (ref 4.0–10.5)
nRBC: 5.4 % — ABNORMAL HIGH (ref 0.0–0.2)

## 2020-03-12 LAB — ABO/RH: ABO/RH(D): A POS

## 2020-03-12 MED ORDER — CLONAZEPAM 0.5 MG PO TABS
0.2500 mg | ORAL_TABLET | Freq: Two times a day (BID) | ORAL | Status: DC
  Administered 2020-03-12 (×2): 0.25 mg via ORAL
  Filled 2020-03-12 (×2): qty 1

## 2020-03-12 MED ORDER — DOCUSATE SODIUM 100 MG PO CAPS
100.0000 mg | ORAL_CAPSULE | Freq: Two times a day (BID) | ORAL | Status: DC
  Administered 2020-03-12 – 2020-03-13 (×3): 100 mg via ORAL
  Filled 2020-03-12 (×3): qty 1

## 2020-03-12 MED ORDER — GABAPENTIN 300 MG PO CAPS
300.0000 mg | ORAL_CAPSULE | Freq: Three times a day (TID) | ORAL | Status: DC
  Administered 2020-03-12 – 2020-03-13 (×4): 300 mg via ORAL
  Filled 2020-03-12 (×4): qty 1

## 2020-03-12 MED ORDER — SODIUM CHLORIDE 0.9% FLUSH: 10.0000 mL | INTRAVENOUS | Status: DC | PRN

## 2020-03-12 MED ORDER — IPRATROPIUM BROMIDE 0.02 % IN SOLN
0.5000 mg | Freq: Three times a day (TID) | RESPIRATORY_TRACT | Status: DC
  Administered 2020-03-13: 0.5 mg via RESPIRATORY_TRACT
  Filled 2020-03-12 (×2): qty 2.5

## 2020-03-12 MED ORDER — OXYCODONE HCL 5 MG PO TABS
5.0000 mg | ORAL_TABLET | ORAL | Status: DC | PRN
  Administered 2020-03-12 (×2): 5 mg via ORAL
  Filled 2020-03-12 (×3): qty 1

## 2020-03-12 MED ORDER — VALACYCLOVIR HCL 500 MG PO TABS
1000.0000 mg | ORAL_TABLET | Freq: Every day | ORAL | Status: DC
  Administered 2020-03-12 – 2020-03-16 (×5): 1000 mg via ORAL
  Filled 2020-03-12 (×5): qty 2

## 2020-03-12 MED ORDER — IPRATROPIUM-ALBUTEROL 0.5-2.5 (3) MG/3ML IN SOLN
3.0000 mL | RESPIRATORY_TRACT | Status: DC | PRN
  Administered 2020-03-12 (×2): 3 mL via RESPIRATORY_TRACT
  Filled 2020-03-12 (×2): qty 3

## 2020-03-12 MED ORDER — LEVALBUTEROL HCL 0.63 MG/3ML IN NEBU: 0.6300 mg | INHALATION_SOLUTION | Freq: Four times a day (QID) | RESPIRATORY_TRACT | Status: DC | PRN

## 2020-03-12 MED ORDER — POLYETHYLENE GLYCOL 3350 17 G PO PACK
17.0000 g | PACK | Freq: Every day | ORAL | Status: DC
  Administered 2020-03-12 – 2020-03-16 (×5): 17 g via ORAL
  Filled 2020-03-12 (×5): qty 1

## 2020-03-12 MED ORDER — CHLORHEXIDINE GLUCONATE CLOTH 2 % EX PADS
6.0000 | MEDICATED_PAD | Freq: Every day | CUTANEOUS | Status: DC
  Administered 2020-03-12: 6 via TOPICAL

## 2020-03-12 MED ORDER — SODIUM CHLORIDE 0.9% FLUSH
10.0000 mL | Freq: Two times a day (BID) | INTRAVENOUS | Status: DC
  Administered 2020-03-13 – 2020-03-15 (×4): 10 mL
  Administered 2020-03-15: 40 mL
  Administered 2020-03-16: 10 mL

## 2020-03-12 MED ORDER — LEVALBUTEROL HCL 1.25 MG/0.5ML IN NEBU
1.2500 mg | INHALATION_SOLUTION | Freq: Three times a day (TID) | RESPIRATORY_TRACT | Status: DC
  Administered 2020-03-13: 1.25 mg via RESPIRATORY_TRACT
  Filled 2020-03-12 (×2): qty 0.5

## 2020-03-12 MED ORDER — CEFAZOLIN SODIUM-DEXTROSE 2-4 GM/100ML-% IV SOLN
2.0000 g | INTRAVENOUS | Status: AC
  Filled 2020-03-12: qty 100

## 2020-03-12 MED ORDER — BUPRENORPHINE HCL-NALOXONE HCL 8-2 MG SL SUBL
1.0000 | SUBLINGUAL_TABLET | Freq: Every day | SUBLINGUAL | Status: DC
  Administered 2020-03-12 – 2020-03-15 (×4): 1 via SUBLINGUAL
  Filled 2020-03-12 (×4): qty 1

## 2020-03-12 NOTE — Progress Notes (Signed)
Spoke with Lang Snow NP about patient's repeat Hgb of 7.5. Verbal order to give one unit of PRBC and then repeat H/H in the AM before giving second unit.

## 2020-03-12 NOTE — Progress Notes (Signed)
PROGRESS NOTE  Terri Estes [id number] DOB: 07-Sep-1986 DOA: 03/05/2020 PCP: Patient, No Pcp Per  HPI/Recap of past 24 hours: Terri Estes is a 34 y.o. female with medical history significant of metastatic breast cancer, recurrent malignant R pleural effusion, GERD, asthma, depression/anxiety, chronic pain, psoriasis who was just discharged from the hospital on March 11 after admission from March 9 due to worsening shortness of breath.  Patient was found to have pulmonary embolism at the time with large right pleural effusion requiring thoracentesis on March 10.  At that point plan was to put a Pleurx catheter if this recurs.  Patient went for her follow-up with oncology Dr. Jana Hakim on the day of presentation and was found to have dyspnea with hypoxia. Imaging shows reaccumulation of her R pleural effusion and significant atelectasis.  She was sent to the ER for further evaluation.  [facility] asked to admit.  03/12/20: Seen and examined.  Very anxious on exam this morning.  Conversational dyspnea noted.  She denies any chest pain.  Her Xarelto is on hold since yesterday.  Discussed with interventional radiology, PA Allred, oncology, Dr. Jana Hakim, and radiation oncology, Dr. Lisbeth Renshaw.  Plan is to have right thoracentesis tomorrow and possible Pleurx drain placement.  She has 3 sessions of radiation left.  Will be held off today.  Would possibly resume radiation tomorrow, per Dr. Lisbeth Renshaw if hemodynamically stable.   Assessment/Plan: Principal Problem:   Malignant pleural effusion Active Problems:   Acute blood loss anemia   Acute respiratory failure (HCC)   Pulmonary embolism (HCC)  Acute hypoxic respiratory failure likely secondary to recurrent malignant right pleural effusion Presented to her oncologist with dyspnea and hypoxia Currently requiring at least 2 L of oxygen by nasal cannula to maintain O2 saturation greater 92% Not on oxygen supplementation at baseline Personally reviewed  chest x-ray done on admission which showed large right pleural effusion with atelectasis and near complete whiteout of the right side. Continue to maintain O2 saturation greater than 92% Plan is to have right thoracentesis tomorrow and possible Pleurx drain placement.   Start continuous pulse oximetry monitoring.  Anemia of chronic disease in the setting of malignancy Baseline hemoglobin appears to be 10 Presented with hemoglobin of 7.2 with MCV of 94 No acute signs of bleed Repeated hemoglobin 7.5 Transfuse for hemoglobin less than 7.0. Continue to monitor H&H  Recent pulmonary embolism Xarelto on hold due to planned procedure possibly tomorrow Continue to hold off Xarelto, defer to interventional radiology to restart.  Metastatic breast cancer Followed by Dr. Jana Hakim, medical oncology and Dr. Lisbeth Renshaw, radiation oncology She has 3 sessions of radiation for left Radiation will be held off today Will possibly resume radiation tomorrow if hemodynamically stable Management per oncology  Chronic depression/anxiety Resume home medications Caution with use of opiates and benzodiazepine, will use judiciously.  Chronic pain syndrome Management as stated above  GERD Continue home regimen  DVT prophylaxis: SCD Code Status: Full code Family Communication: Discussed care with the patient  Disposition Plan:  Patient is from home.  Anticipate discharge to home in the next 48 to 72 hours.  Barrier to discharge, pending procedures by interventional radiology and radiation oncology, as well as treatment of her recurrent right malignant pleural effusion.   Consults called: Interventional radiology.  Discussed with Dr. Pascal Lux   Objective: Vitals:   03/12/20 0601 03/12/20 0647 03/12/20 0909 03/12/20 0911  BP: 95/70  109/82   Pulse: (!) 124  (!) 132   Resp: 20  Marland Kitchen)  32 (!) 24  Temp: 97.8 F (36.6 C)  98.6 F (37 C)   TempSrc: Oral  Oral   SpO2: 100% 100% 98%   Weight:      Height:        No intake or output data in the 24 hours ending 03/12/20 1037 Filed Weights   02/27/2020 2259  Weight: 65.1 kg    Exam:  . General: 34 y.o. year-old female well developed well nourished in no acute distress.  Alert and oriented x3.  Very anxious. . Cardiovascular: Tachycardic with no rubs or gallops.  No thyromegaly or JVD noted.   Marland Kitchen Respiratory: Rales noted at left base.  No wheezing noted.  Poor inspiratory effort. Abdomen: Soft nontender nondistended with normal bowel sounds x4 quadrants. . Musculoskeletal: No lower extremity edema. 2/4 pulses in all 4 extremities. Marland Kitchen Psychiatry: Mood is very anxious.   Data Reviewed: CBC: Recent Labs  Lab 03/06/20 0713 03/14/2020 1750 03/12/20 0020  WBC 12.3* 8.2  --   NEUTROABS  --  7.4  --   HGB 10.0* 7.2* 7.5*  HCT 31.6* 23.0* 24.2*  MCV 94.9 100.4*  --   PLT 126* 90*  --    Basic Metabolic Panel: Recent Labs  Lab 03/06/20 0713 02/25/2020 1750  NA 133* 136  K 4.0 4.2  CL 93* 98  CO2 28 29  GLUCOSE 109* 113*  BUN 14 19  CREATININE 0.50 0.51  CALCIUM 8.9 8.8*   GFR: Estimated Creatinine Clearance: 90.2 mL/min (by C-G formula based on SCr of 0.51 mg/dL). Liver Function Tests: No results for input(s): AST, ALT, ALKPHOS, BILITOT, PROT, ALBUMIN in the last 168 hours. No results for input(s): LIPASE, AMYLASE in the last 168 hours. No results for input(s): AMMONIA in the last 168 hours. Coagulation Profile: No results for input(s): INR, PROTIME in the last 168 hours. Cardiac Enzymes: No results for input(s): CKTOTAL, CKMB, CKMBINDEX, TROPONINI in the last 168 hours. BNP (last 3 results) No results for input(s): PROBNP in the last 8760 hours. HbA1C: No results for input(s): HGBA1C in the last 72 hours. CBG: No results for input(s): GLUCAP in the last 168 hours. Lipid Profile: No results for input(s): CHOL, HDL, LDLCALC, TRIG, CHOLHDL, LDLDIRECT in the last 72 hours. Thyroid Function Tests: No results for input(s): TSH,  T4TOTAL, FREET4, T3FREE, THYROIDAB in the last 72 hours. Anemia Panel: No results for input(s): VITAMINB12, FOLATE, FERRITIN, TIBC, IRON, RETICCTPCT in the last 72 hours. Urine analysis:    Component Value Date/Time   COLORURINE YELLOW 11/29/2019 0138   APPEARANCEUR HAZY (A) 11/29/2019 0138   LABSPEC 1.020 11/29/2019 0138   PHURINE 6.0 11/29/2019 0138   GLUCOSEU NEGATIVE 11/29/2019 0138   HGBUR NEGATIVE 11/29/2019 0138   BILIRUBINUR NEGATIVE 11/29/2019 0138   KETONESUR 5 (A) 11/29/2019 0138   PROTEINUR NEGATIVE 11/29/2019 0138   UROBILINOGEN 4.0 (H) 01/13/2018 1417   NITRITE NEGATIVE 11/29/2019 0138   LEUKOCYTESUR MODERATE (A) 11/29/2019 0138   Sepsis Labs: @LABRCNTIP (procalcitonin:4,lacticidven:4)  ) Recent Results (from the past 240 hour(s))  Respiratory Panel by RT PCR (Flu A&B, Covid) - Nasopharyngeal Swab     Status: None   Collection Time: 03/04/20  4:20 PM   Specimen: Nasopharyngeal Swab  Result Value Ref Range Status   SARS Coronavirus 2 by RT PCR NEGATIVE NEGATIVE Final    Comment: (NOTE) SARS-CoV-2 target nucleic acids are NOT DETECTED. The SARS-CoV-2 RNA is generally detectable in upper respiratoy specimens during the acute phase of infection. The lowest concentration of  SARS-CoV-2 viral copies this assay can detect is 131 copies/mL. A negative result does not preclude SARS-Cov-2 infection and should not be used as the sole basis for treatment or other patient management decisions. A negative result may occur with  improper specimen collection/handling, submission of specimen other than nasopharyngeal swab, presence of viral mutation(s) within the areas targeted by this assay, and inadequate number of viral copies (<131 copies/mL). A negative result must be combined with clinical observations, patient history, and epidemiological information. The expected result is Negative. Fact Sheet for Patients:  PinkCheek.be Fact Sheet for  Healthcare Providers:  GravelBags.it This test is not yet ap proved or cleared by the [place] FDA and  has been authorized for detection and/or diagnosis of SARS-CoV-2 by FDA under an Emergency Use Authorization (EUA). This EUA will remain  in effect (meaning this test can be used) for the duration of the COVID-19 declaration under Section 564(b)(1) of the Act, 21 U.S.C. section 360bbb-3(b)(1), unless the authorization is terminated or revoked sooner.    Influenza A by PCR NEGATIVE NEGATIVE Final   Influenza B by PCR NEGATIVE NEGATIVE Final    Comment: (NOTE) The Xpert Xpress SARS-CoV-2/FLU/RSV assay is intended as an aid in  the diagnosis of influenza from Nasopharyngeal swab specimens and  should not be used as a sole basis for treatment. Nasal washings and  aspirates are unacceptable for Xpert Xpress SARS-CoV-2/FLU/RSV  testing. Fact Sheet for Patients: PinkCheek.be Fact Sheet for Healthcare Providers: GravelBags.it This test is not yet approved or cleared by the [place] FDA and  has been authorized for detection and/or diagnosis of SARS-CoV-2 by  FDA under an Emergency Use Authorization (EUA). This EUA will remain  in effect (meaning this test can be used) for the duration of the  Covid-19 declaration under Section 564(b)(1) of the Act, 21  U.S.C. section 360bbb-3(b)(1), unless the authorization is  terminated or revoked. Performed at [facility], [place] [address], [place], [place] [address]   Culture, body fluid-bottle     Status: None   Collection Time: 03/05/20 12:23 PM   Specimen: Fluid  Result Value Ref Range Status   Specimen Description FLUID PLEURAL  Final   Special Requests   Final    BOTTLES DRAWN AEROBIC AND ANAEROBIC 10CC Performed at [facility], [address], [place], [place] [address]    Culture NO GROWTH 5 DAYS  Final   Report Status  03/10/2020 FINAL  Final  Gram stain     Status: None   Collection Time: 03/05/20 12:23 PM   Specimen: Fluid  Result Value Ref Range Status   Specimen Description FLUID PLEURAL  Final   Special Requests NONE  Final   Gram Stain   Final    RARE WBC PRESENT,BOTH PMN AND MONONUCLEAR NO ORGANISMS SEEN Performed at [facility], [place] [address], [place], [place] [address]    Report Status 03/05/2020 FINAL  Final  Respiratory Panel by RT PCR (Flu A&B, Covid) - Nasopharyngeal Swab     Status: None   Collection Time: 02/27/2020  7:00 PM   Specimen: Nasopharyngeal Swab  Result Value Ref Range Status   SARS Coronavirus 2 by RT PCR NEGATIVE NEGATIVE Final    Comment: (NOTE) SARS-CoV-2 target nucleic acids are NOT DETECTED. The SARS-CoV-2 RNA is generally detectable in upper respiratoy specimens during the acute phase of infection. The lowest concentration of SARS-CoV-2 viral copies this assay can detect is 131 copies/mL. A negative result does  not preclude SARS-Cov-2 infection and should not be used as the sole basis for treatment or other patient management decisions. A negative result may occur with  improper specimen collection/handling, submission of specimen other than nasopharyngeal swab, presence of viral mutation(s) within the areas targeted by this assay, and inadequate number of viral copies (<131 copies/mL). A negative result must be combined with clinical observations, patient history, and epidemiological information. The expected result is Negative. Fact Sheet for Patients:  PinkCheek.be Fact Sheet for Healthcare Providers:  GravelBags.it This test is not yet ap proved or cleared by the [place] FDA and  has been authorized for detection and/or diagnosis of SARS-CoV-2 by FDA under an Emergency Use Authorization (EUA). This EUA will remain  in effect (meaning this test can be used) for the duration of  the COVID-19 declaration under Section 564(b)(1) of the Act, 21 U.S.C. section 360bbb-3(b)(1), unless the authorization is terminated or revoked sooner.    Influenza A by PCR NEGATIVE NEGATIVE Final   Influenza B by PCR NEGATIVE NEGATIVE Final    Comment: (NOTE) The Xpert Xpress SARS-CoV-2/FLU/RSV assay is intended as an aid in  the diagnosis of influenza from Nasopharyngeal swab specimens and  should not be used as a sole basis for treatment. Nasal washings and  aspirates are unacceptable for Xpert Xpress SARS-CoV-2/FLU/RSV  testing. Fact Sheet for Patients: PinkCheek.be Fact Sheet for Healthcare Providers: GravelBags.it This test is not yet approved or cleared by the [place] FDA and  has been authorized for detection and/or diagnosis of SARS-CoV-2 by  FDA under an Emergency Use Authorization (EUA). This EUA will remain  in effect (meaning this test can be used) for the duration of the  Covid-19 declaration under Section 564(b)(1) of the Act, 21  U.S.C. section 360bbb-3(b)(1), unless the authorization is  terminated or revoked. Performed at [facility], [place] [address], [place], [place] [address]       Studies: DG Chest Port 1 View  Result Date: 03/02/2020 CLINICAL DATA:  Shortness of breath EXAM: PORTABLE CHEST 1 VIEW COMPARISON:  03/05/2020 FINDINGS: Left-sided central venous port tip over the SVC. Completely opacified right thorax with minimal aeration at the right apex without change. Small left effusion with mild airspace disease at the left base. Mild diffuse interstitial opacities in the left thorax. Obscured cardiomediastinal silhouette. IMPRESSION: 1. Persistent large right pleural effusion with near complete opacification of right thorax. There is a small amount of aerated lung at the right apex 2. Probable left pleural effusion with left basilar atelectasis or pneumonia. Mild diffuse  interstitial process in the left thorax may reflect edema or diffuse infection. Small lung nodules are also noted. Electronically Signed   By: Donavan Foil M.D.   On: 03/26/2020 18:06    Scheduled Meds: . buprenorphine-naloxone  1 tablet Sublingual Daily  . Chlorhexidine Gluconate Cloth  6 each Topical Daily  . docusate sodium  100 mg Oral BID  . gabapentin  300 mg Oral TID  . influenza vac split quadrivalent PF  0.5 mL Intramuscular Tomorrow-1000  . polyethylene glycol  17 g Oral Daily  . sodium chloride flush  10-40 mL Intracatheter Q12H  . valACYclovir  1,000 mg Oral Daily    Continuous Infusions:   LOS: 1 day     Kayleen Memos, MD [facility] Pager [phone]  If 7PM-7AM, please contact night-coverage [email or web address] Password [email or web address] 03/12/2020, 10:37 AM

## 2020-03-12 NOTE — Progress Notes (Signed)
Referring Physician(s): Magrinat,G/Hall,C  Supervising Physician: Arne Cleveland  Patient Status:  [facility] - In-pt  Chief Complaint: Chest pain, shortness of breath, recurrent malignant right pleural effusion   Subjective: Patient familiar to IR service from prior thoracenteses, latest on 03/05/2020 yielding 570 cc of bloody fluid with cytology yielding malignant cells/metastatic adenocarcinoma.  She has a history of stage IV right breast cancer with prior mastectomy and chemoradiation.  Latest chest x-ray shows persistent large right pleural effusion with near complete opacification of right thorax as well as small left pleural effusion with left basilar atelectasis or pneumonia and some small lung nodules.  Patient continues to be dyspneic with chest pain, cough and occasional blood-tinged sputum.  She has a history of recent PE , previously on Xarelto with last dose yesterday.Request now received from oncology/primary care team for right Pleurx catheter placement.  Current temp is 99.3, patient remains tachycardic, hemoglobin 7.5, plts 90k; PT/INR nl.  She currently denies nausea, vomiting, abdominal pain but does have fatigue,  intermittent headaches and back pain.  Past Medical History:  Diagnosis Date  . Anemia    after pregnancy  . Anxiety    severe not taking meds  . Asthma    inhaler  4x per day  . Cancer ([place])   . Chronic pain   . Depression   . Family history of breast cancer   . GERD (gastroesophageal reflux disease)   . History of substance abuse ([place])   . Pregnancy induced hypertension    takes medication now.  . Psoriasis    all over body  . Trichomonas infection   . Vaginal Pap smear, abnormal    Past Surgical History:  Procedure Laterality Date  . CESAREAN SECTION  05/26/2008  . CESAREAN SECTION N/A 07/10/2017   Procedure: CESAREAN SECTION;  Surgeon: Florian Buff, MD;  Location: [place];  Service: Obstetrics;  Laterality: N/A;  . CESAREAN SECTION  N/A 09/23/2018   Procedure: CESAREAN SECTION;  Surgeon: Donnamae Jude, MD;  Location: [place];  Service: Obstetrics;  Laterality: N/A;  . COLPOSCOPY    . MASTECTOMY WITH AXILLARY LYMPH NODE DISSECTION Right 10/16/2019   Procedure: RIGHT MASTECTOMY WITH AXILLARY LYMPH NODE DISSECTION;  Surgeon: Alphonsa Overall, MD;  Location: [place];  Service: General;  Laterality: Right;  . PORTACATH PLACEMENT Left 04/30/2019   Procedure: INSERTION PORT-A-CATH WITH ULTRASOUND;  Surgeon: Alphonsa Overall, MD;  Location: [facility];  Service: General;  Laterality: Left;  Left Subclavian vein  . WISDOM TOOTH EXTRACTION       Allergies: Patient has no known allergies.  Medications: Prior to Admission medications   Medication Sig Start Date End Date Taking? Authorizing Provider  albuterol (VENTOLIN HFA) 108 (90 Base) MCG/ACT inhaler Inhale 2 puffs into the lungs every 6 (six) hours as needed for wheezing or shortness of breath. 02/15/20  Yes Magrinat, [name], MD  benzonatate (TESSALON) 100 MG capsule Take 1 capsule (100 mg total) by mouth 3 (three) times daily as needed for cough. 03/06/20  Yes Rai, Ripudeep K, MD  buprenorphine (SUBUTEX) 8 MG SUBL SL tablet Place 8 mg under the tongue 2 (two) times daily.    Yes [provider]  capecitabine (XELODA) 500 MG tablet Take 2 tablets (1,000 mg total) by mouth 2 (two) times daily after a meal. Take Monday-Friday. Take only on days of radiation. 02/08/20  Yes Magrinat, [name], MD  cholestyramine Lucrezia Starch) 4 g packet Take 1 packet (4 g total) by  mouth 3 (three) times daily with meals. Patient taking differently: Take 4 g by mouth 3 (three) times daily with meals as needed (diarrhea).  05/11/19  Yes Magrinat, [name], MD  clonazePAM (KLONOPIN) 1 MG tablet Take 1 tablet (1 mg total) by mouth at bedtime as needed for anxiety. 03/06/20 03/06/21 Yes Rai, Ripudeep K, MD  cyclobenzaprine (FLEXERIL) 10 MG tablet Take 1 tablet (10 mg total) by mouth 3 (three) times daily  as needed for muscle spasms. 09/26/18  Yes Donnamae Jude, MD  dexamethasone (DECADRON) 4 MG tablet Take 1 tablet (4 mg total) by mouth daily with breakfast. 02/15/20  Yes Magrinat, [name], MD  Famotidine (PEPCID PO) Take 1 tablet by mouth daily.   Yes [provider]  gabapentin (NEURONTIN) 300 MG capsule TAKE 1 CAPSULE(300 MG) BY MOUTH THREE TIMES DAILY Patient taking differently: Take 300 mg by mouth 3 (three) times daily.  09/10/19  Yes Causey, Charlestine Massed, NP  guaiFENesin 200 MG tablet Take 200 mg by mouth every 4 (four) hours as needed for cough or to loosen phlegm.   Yes [provider]  HYDROcodone-homatropine (HYCODAN) 5-1.5 MG/5ML syrup Take 5 mLs by mouth every 6 (six) hours as needed for cough. 03/06/20  Yes Rai, Ripudeep K, MD  metoCLOPramide (REGLAN) 10 MG tablet Take 1 tablet (10 mg total) by mouth every 8 (eight) hours as needed for nausea. 11/29/19  Yes Caccavale, Sophia, PA-C  ondansetron (ZOFRAN) 8 MG tablet Take 1 tablet (8 mg total) by mouth every 8 (eight) hours as needed for nausea or vomiting. 11/21/19  Yes Magrinat, [name], MD  oxyCODONE (OXY IR/ROXICODONE) 5 MG immediate release tablet Take 1 tablet (5 mg total) by mouth every 6 (six) hours as needed for severe pain. 02/29/20  Yes Kyung Rudd, MD  Rivaroxaban 15 & 20 MG TBPK Follow package directions: Take one 15mg  tablet by mouth twice a day. On day 22 (03/26/2020), switch to one 20mg  tablet once a day. Take with food. 03/06/20  Yes Rai, Ripudeep K, MD  valACYclovir (VALTREX) 1000 MG tablet Take 1 tablet (1,000 mg total) by mouth 2 (two) times daily. Patient taking differently: Take 1,000 mg by mouth 2 (two) times daily as needed (viral outbreak).  06/26/19  Yes Causey, Charlestine Massed, NP  albuterol (VENTOLIN HFA) 108 (90 Base) MCG/ACT inhaler Inhale 2 puffs into the lungs 4 (four) times daily. Patient not taking: Reported on 03/15/2020 02/15/20   Magrinat, [name], MD  rivaroxaban (XARELTO) 20 MG TABS  tablet Take 1 tablet (20 mg total) by mouth daily with supper. Please start after the starter pack has been completed 03/26/20   Rai, Ripudeep K, MD  enalapril (VASOTEC) 10 MG tablet TAKE 1 TABLET BY MOUTH DAILY Patient taking differently: Take 10 mg by mouth daily.  03/15/19   Donnamae Jude, MD     Vital Signs: BP 109/82 (BP Location: Left Arm)   Pulse (!) 132   Temp 98.6 F (37 C) (Oral)   Resp (!) 24   Ht 5\' 2"  (1.575 m)   Wt 143 lb 9.6 oz (65.1 kg)   SpO2 98%   BMI 26.26 kg/m   Physical Exam awake, alert.  Chest with scattered rhonchi throughout, diminished breath sounds bases, R >L.  Clean, intact left chest wall Port-A-Cath.  Heart with tachycardic rate, occasional ectopy.  Abdomen soft, positive bowel sounds, nontender.  No significant lower extremity edema.  Imaging: DG Chest Port 1 View  Result Date: 03/20/2020  CLINICAL DATA:  Shortness of breath EXAM: PORTABLE CHEST 1 VIEW COMPARISON:  03/05/2020 FINDINGS: Left-sided central venous port tip over the SVC. Completely opacified right thorax with minimal aeration at the right apex without change. Small left effusion with mild airspace disease at the left base. Mild diffuse interstitial opacities in the left thorax. Obscured cardiomediastinal silhouette. IMPRESSION: 1. Persistent large right pleural effusion with near complete opacification of right thorax. There is a small amount of aerated lung at the right apex 2. Probable left pleural effusion with left basilar atelectasis or pneumonia. Mild diffuse interstitial process in the left thorax may reflect edema or diffuse infection. Small lung nodules are also noted. Electronically Signed   By: Donavan Foil M.D.   On: 03/02/2020 18:06    Labs:  CBC: Recent Labs    03/04/20 1405 03/04/20 1405 03/05/20 0618 03/06/20 0713 03/14/2020 1750 03/12/20 0020  WBC 13.8*  --  10.4 12.3* 8.2  --   HGB 11.3*   < > 10.3* 10.0* 7.2* 7.5*  HCT 35.6*   < > 33.0* 31.6* 23.0* 24.2*  PLT 154  --   151 126* 90*  --    < > = values in this interval not displayed.    COAGS: Recent Labs    03/04/20 2005  INR 1.2  APTT 25    BMP: Recent Labs    03/04/20 1405 03/05/20 0618 03/06/20 0713 03/09/2020 1750  NA 139 135 133* 136  K 3.9 3.6 4.0 4.2  CL 101 97* 93* 98  CO2 26 29 28 29   GLUCOSE 116* 112* 109* 113*  BUN 11 16 14 19   CALCIUM 9.3 8.9 8.9 8.8*  CREATININE 0.69 0.69 0.50 0.51  GFRNONAA >60 >60 >60 >60  GFRAA >60 >60 >60 >60    LIVER FUNCTION TESTS: Recent Labs    01/03/20 1509 01/24/20 1255 02/15/20 1120 03/04/20 1405  BILITOT 0.3 0.3 0.7 0.5  AST 17 12* 59* 39  ALT 9 12 37 62*  ALKPHOS 85 75 115 188*  PROT 8.1 6.9 7.7 7.1  ALBUMIN 4.2 3.2* 3.0* 2.7*    Assessment and Plan: Pt with history of stage IV right breast cancer (initial diagnosis 2020) with prior mastectomy and chemoradiation. Now with progressive disease.  Status post thoracenteses x2 with latest on 03/05/20 yielding 570 cc bloody fluid with positive cytology. Latest chest x-ray shows persistent large right pleural effusion with near complete opacification of right thorax as well as small left pleural effusion with left basilar atelectasis or pneumonia and some small lung nodules.  Patient continues to be dyspneic with chest pain, cough and occasional blood-tinged sputum.  She has a history of recent PE , previously on Xarelto with last dose yesterday.Request now received from oncology/primary care team for right Pleurx catheter placement.  Current temp is 99.3, patient remains tachycardic, hemoglobin 7.5, plts 90k; PT/INR nl, recent pleural fluid cx neg, COVID 19 neg. Imaging studies were reviewed by Dr. Annamaria Boots.  Details/risks of Pleurx catheter placement, including but not limited to, internal bleeding, infection, injury to adjacent structures, pneumothorax, inability to completely drain pleural fluid discussed with patient with her understanding and consent.  Procedure tentatively planned for  3/18.   Electronically Signed: D. Rowe Robert, PA-C 03/12/2020, 10:56 AM   I spent a total of 30 minutes at the the patient's bedside AND on the patient's hospital floor or unit, greater than 50% of which was counseling/coordinating care for right Pleurx catheter placement    Patient ID: Terri Estes, female   DOB: 29-Apr-1986, 34 y.o.   MRN: [id number]

## 2020-03-12 NOTE — Progress Notes (Signed)
IZOLA TEAGUE   DOB:05/26/1986   SJ#:[id number]   TML#:[id number]  Subjective:  Terri Estes is hurting this morning.  She has pain in the left chest, but also in the rib cage.  Her cough is a little bit better she says.  She is not bringing up any blood or mucus.  She feels very short of breath and wonders if we are going to be able to get her oxygen at home.   Objective: Young African-American woman examined in bed Vitals:   03/12/20 0601 03/12/20 0647  BP: 95/70   Pulse: (!) 124   Resp: 20   Temp: 97.8 F (36.6 C)   SpO2: 100% 100%    Body mass index is 26.26 kg/m. No intake or output data in the 24 hours ending 03/12/20 0846   CBG (last 3)  No results for input(s): GLUCAP in the last 72 hours.   Labs:  Lab Results  Component Value Date   WBC 8.2 03/12/2020   HGB 7.5 (L) 03/12/2020   HCT 24.2 (L) 03/12/2020   MCV 100.4 (H) 03/01/2020   PLT 90 (L) 02/25/2020   NEUTROABS 7.4 03/10/2020    '@LASTCHEMISTRY' @  Urine Studies No results for input(s): UHGB, CRYS in the last 72 hours.  Invalid input(s): UACOL, UAPR, USPG, UPH, UTP, UGL, UKET, UBIL, UNIT, UROB, ULEU, UEPI, UWBC, URBC, UBAC, CAST, UCOM, BILUA  Basic Metabolic Panel: Recent Labs  Lab 03/06/20 0713 03/09/2020 1750  NA 133* 136  K 4.0 4.2  CL 93* 98  CO2 28 29  GLUCOSE 109* 113*  BUN 14 19  CREATININE 0.50 0.51  CALCIUM 8.9 8.8*   GFR Estimated Creatinine Clearance: 90.2 mL/min (by C-G formula based on SCr of 0.51 mg/dL). Liver Function Tests: No results for input(s): AST, ALT, ALKPHOS, BILITOT, PROT, ALBUMIN in the last 168 hours. No results for input(s): LIPASE, AMYLASE in the last 168 hours. No results for input(s): AMMONIA in the last 168 hours. Coagulation profile No results for input(s): INR, PROTIME in the last 168 hours.  CBC: Recent Labs  Lab 03/06/20 0713 03/10/2020 1750 03/12/20 0020  WBC 12.3* 8.2  --   NEUTROABS  --  7.4  --   HGB 10.0* 7.2* 7.5*  HCT 31.6* 23.0* 24.2*  MCV 94.9  100.4*  --   PLT 126* 90*  --    Cardiac Enzymes: No results for input(s): CKTOTAL, CKMB, CKMBINDEX, TROPONINI in the last 168 hours. BNP: Invalid input(s): POCBNP CBG: No results for input(s): GLUCAP in the last 168 hours. D-Dimer No results for input(s): DDIMER in the last 72 hours. Hgb A1c No results for input(s): HGBA1C in the last 72 hours. Lipid Profile No results for input(s): CHOL, HDL, LDLCALC, TRIG, CHOLHDL, LDLDIRECT in the last 72 hours. Thyroid function studies No results for input(s): TSH, T4TOTAL, T3FREE, THYROIDAB in the last 72 hours.  Invalid input(s): FREET3 Anemia work up No results for input(s): VITAMINB12, FOLATE, FERRITIN, TIBC, IRON, RETICCTPCT in the last 72 hours. Microbiology Recent Results (from the past 240 hour(s))  Respiratory Panel by RT PCR (Flu A&B, Covid) - Nasopharyngeal Swab     Status: None   Collection Time: 03/04/20  4:20 PM   Specimen: Nasopharyngeal Swab  Result Value Ref Range Status   SARS Coronavirus 2 by RT PCR NEGATIVE NEGATIVE Final    Comment: (NOTE) SARS-CoV-2 target nucleic acids are NOT DETECTED. The SARS-CoV-2 RNA is generally detectable in upper respiratoy specimens during the acute phase of infection. The lowest concentration of  SARS-CoV-2 viral copies this assay can detect is 131 copies/mL. A negative result does not preclude SARS-Cov-2 infection and should not be used as the sole basis for treatment or other patient management decisions. A negative result may occur with  improper specimen collection/handling, submission of specimen other than nasopharyngeal swab, presence of viral mutation(s) within the areas targeted by this assay, and inadequate number of viral copies (<131 copies/mL). A negative result must be combined with clinical observations, patient history, and epidemiological information. The expected result is Negative. Fact Sheet for Patients:  PinkCheek.be Fact Sheet for  Healthcare Providers:  GravelBags.it This test is not yet ap proved or cleared by the [place] FDA and  has been authorized for detection and/or diagnosis of SARS-CoV-2 by FDA under an Emergency Use Authorization (EUA). This EUA will remain  in effect (meaning this test can be used) for the duration of the COVID-19 declaration under Section 564(b)(1) of the Act, 21 U.S.C. section 360bbb-3(b)(1), unless the authorization is terminated or revoked sooner.    Influenza A by PCR NEGATIVE NEGATIVE Final   Influenza B by PCR NEGATIVE NEGATIVE Final    Comment: (NOTE) The Xpert Xpress SARS-CoV-2/FLU/RSV assay is intended as an aid in  the diagnosis of influenza from Nasopharyngeal swab specimens and  should not be used as a sole basis for treatment. Nasal washings and  aspirates are unacceptable for Xpert Xpress SARS-CoV-2/FLU/RSV  testing. Fact Sheet for Patients: PinkCheek.be Fact Sheet for Healthcare Providers: GravelBags.it This test is not yet approved or cleared by the [place] FDA and  has been authorized for detection and/or diagnosis of SARS-CoV-2 by  FDA under an Emergency Use Authorization (EUA). This EUA will remain  in effect (meaning this test can be used) for the duration of the  Covid-19 declaration under Section 564(b)(1) of the Act, 21  U.S.C. section 360bbb-3(b)(1), unless the authorization is  terminated or revoked. Performed at [facility], [place] [address], [place], [place] [address]   Culture, body fluid-bottle     Status: None   Collection Time: 03/05/20 12:23 PM   Specimen: Fluid  Result Value Ref Range Status   Specimen Description FLUID PLEURAL  Final   Special Requests   Final    BOTTLES DRAWN AEROBIC AND ANAEROBIC 10CC Performed at [facility], [address], [place], [place] [address]    Culture NO GROWTH 5 DAYS  Final   Report Status  03/10/2020 FINAL  Final  Gram stain     Status: None   Collection Time: 03/05/20 12:23 PM   Specimen: Fluid  Result Value Ref Range Status   Specimen Description FLUID PLEURAL  Final   Special Requests NONE  Final   Gram Stain   Final    RARE WBC PRESENT,BOTH PMN AND MONONUCLEAR NO ORGANISMS SEEN Performed at [facility], [place] [address], [place], [place] [address]    Report Status 03/05/2020 FINAL  Final  Respiratory Panel by RT PCR (Flu A&B, Covid) - Nasopharyngeal Swab     Status: None   Collection Time: 02/27/2020  7:00 PM   Specimen: Nasopharyngeal Swab  Result Value Ref Range Status   SARS Coronavirus 2 by RT PCR NEGATIVE NEGATIVE Final    Comment: (NOTE) SARS-CoV-2 target nucleic acids are NOT DETECTED. The SARS-CoV-2 RNA is generally detectable in upper respiratoy specimens during the acute phase of infection. The lowest concentration of SARS-CoV-2 viral copies this assay can detect is 131 copies/mL. A negative result does  not preclude SARS-Cov-2 infection and should not be used as the sole basis for treatment or other patient management decisions. A negative result may occur with  improper specimen collection/handling, submission of specimen other than nasopharyngeal swab, presence of viral mutation(s) within the areas targeted by this assay, and inadequate number of viral copies (<131 copies/mL). A negative result must be combined with clinical observations, patient history, and epidemiological information. The expected result is Negative. Fact Sheet for Patients:  PinkCheek.be Fact Sheet for Healthcare Providers:  GravelBags.it This test is not yet ap proved or cleared by the [place] FDA and  has been authorized for detection and/or diagnosis of SARS-CoV-2 by FDA under an Emergency Use Authorization (EUA). This EUA will remain  in effect (meaning this test can be used) for the duration of  the COVID-19 declaration under Section 564(b)(1) of the Act, 21 U.S.C. section 360bbb-3(b)(1), unless the authorization is terminated or revoked sooner.    Influenza A by PCR NEGATIVE NEGATIVE Final   Influenza B by PCR NEGATIVE NEGATIVE Final    Comment: (NOTE) The Xpert Xpress SARS-CoV-2/FLU/RSV assay is intended as an aid in  the diagnosis of influenza from Nasopharyngeal swab specimens and  should not be used as a sole basis for treatment. Nasal washings and  aspirates are unacceptable for Xpert Xpress SARS-CoV-2/FLU/RSV  testing. Fact Sheet for Patients: PinkCheek.be Fact Sheet for Healthcare Providers: GravelBags.it This test is not yet approved or cleared by the [place] FDA and  has been authorized for detection and/or diagnosis of SARS-CoV-2 by  FDA under an Emergency Use Authorization (EUA). This EUA will remain  in effect (meaning this test can be used) for the duration of the  Covid-19 declaration under Section 564(b)(1) of the Act, 21  U.S.C. section 360bbb-3(b)(1), unless the authorization is  terminated or revoked. Performed at [facility], [place] [address], [place], [place] [address]       Studies:  DG Chest Port 1 View  Result Date: 02/25/2020 CLINICAL DATA:  Shortness of breath EXAM: PORTABLE CHEST 1 VIEW COMPARISON:  03/05/2020 FINDINGS: Left-sided central venous port tip over the SVC. Completely opacified right thorax with minimal aeration at the right apex without change. Small left effusion with mild airspace disease at the left base. Mild diffuse interstitial opacities in the left thorax. Obscured cardiomediastinal silhouette. IMPRESSION: 1. Persistent large right pleural effusion with near complete opacification of right thorax. There is a small amount of aerated lung at the right apex 2. Probable left pleural effusion with left basilar atelectasis or pneumonia. Mild diffuse  interstitial process in the left thorax may reflect edema or diffuse infection. Small lung nodules are also noted. Electronically Signed   By: Donavan Foil M.D.   On: 03/10/2020 18:06    Assessment:33 y.o.[place]. 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 (c) baseline breast MRI 04/30/2019 shows involvement of all 4 quadrants in the right breast as well as right axillary subpectoral and internal mammary lymphadenopathy. 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 (1) neoadjuvant chemotherapy consisting of (a) pembrolizumabstarted 05/03/2019, third dose delayed because of elevated liver function tests, resumed 08/02/2019 (b) paclitaxelweekly x12 with carboplatinevery 21 days x 4, started 05/08/2019 (i) tolerated carboplatin AUC 5-day 1 poorly, switched to AUC 2 on day 1 day 8 (ii) cycle 2 delayed 1 week because of elevated liver function tests (iii) carboplatin and paclitaxel discontinuedafter 07/03/2019 because of neuropathy, (received 7 weekly doses paclitaxel and one AUC5 carbo dose, four AUC2 doses) (c) cyclophosphamideand doxorubicin x4 started 07/03/2019, completed 08/29/2019 (i) 2d dose given day 28 due to poor tolerance; other doses Q 21  days  (2) s/p right mastectomy and ALND 10/16/2019 showing a residual ypT2 ypN2invasive ductal carcinoma, grade 3, with negative margins (a) a total of 10 lymph nodes were removed, 4 with macrometastases, 1 with a micrometastasis  (3) adjuvant radiationongoing  (4) genetics testing (a) genetic testing 05/14/2019 through the Common Hereditary Gene Panel offered by [facility] found no deleterious mutationsin APC, ATM, AXIN2, BARD1, BMPR1A, BRCA1, BRCA2, BRIP1, CDH1, CDK4, CDKN2A (p14ARF), CDKN2A (p16INK4a), CHEK2, CTNNA1, DICER1, EPCAM (Deletion/duplication testing only), GREM1 (promoter region deletion/duplication testing only), KIT, MEN1, MLH1, MSH2, MSH3, MSH6, MUTYH, NBN, NF1, NHTL1, PALB2, PDGFRA, PMS2, POLD1, POLE, PTEN, RAD50, RAD51C, RAD51D, RNF43, SDHB, SDHC, SDHD, SMAD4, SMARCA4. STK11, TP53, TSC1, TSC2, and VHL. The following genes were evaluated for sequence changes only: SDHA and HOXB13 c.251G>A variant only  (5) goserelinstarted 05/08/2019  (6) pembrolizumabstarted 08/02/2019, discontinued after fourth dose (10/02/2019) with psoriasis flare  (7) psoriasis: (a) weekly methotrexate started 11/10/2019, held after 11/17/2019 dose so as not to overlap with radiation (b) intravenous cyclophosphamide 1 g every 3 weeks starting 11/21/2019, last dose 01/24/2020  (8)RESTAGING STUDIES: (a) CT chest 01/31/2020 showsprogression in herlung and liver lesions, confirming that findings on initial scans April 2020 represented metastatic disease;right pleural effusion, mediastinal adenopathy, T1 wedge deformity (b) brain MRIpending (c)right thoracentesis 02/07/2020 cytologically positive for triple negative GATA-3 positive cells (d) repeat thoracentesis 03/05/2020  (e) repeat thoracentesis pending-- consider pleurx  (9) startedcapecitabinewith palliative radiation to chest  wall02/17/2021  (a) CT angio 03/04/2020 shows disease progression  (b) capecitabine discontinued 03/04/2020  (10) palliative radiation to be completed 03/14/2020  (11) peripheral pulmonary embolus documented by CT angio 03/04/2020  (a) rivaroxaban started 03/05/2020  (11) to start sacituzumab govitecan once radiation completed   Plan:  [name] was uncomfortable this morning.  She does have IV Dilaudid as needed but her home medicines for some reason were not continued.  I have written for all of those.  Also added bowel prophylaxis orders.  Her rivaroxaban was temporarily stopped yesterday.  It would be safe to proceed to thoracentesis tomorrow as planned.  I would consider Pleurx catheter placement since she appears to reaccumulate rapidly.  [name] is understandably scared.  I reassured her that we do have treatment for her even though hers cancer is not curable.  She already has an appointment with me on 03/20/2020 to discuss that further.  I greatly appreciate your help to this patient and her family.   Chauncey Cruel, MD 03/12/2020  8:46 AM Medical Oncology and Hematology [facility] [address] [place], [place] [address] Tel. [phone]    Fax. [phone]

## 2020-03-13 ENCOUNTER — Telehealth: Payer: Self-pay

## 2020-03-13 ENCOUNTER — Inpatient Hospital Stay (HOSPITAL_COMMUNITY): Payer: Medicaid Other

## 2020-03-13 ENCOUNTER — Ambulatory Visit: Payer: Medicaid Other

## 2020-03-13 ENCOUNTER — Other Ambulatory Visit: Payer: Self-pay | Admitting: Oncology

## 2020-03-13 DIAGNOSIS — D649 Anemia, unspecified: Secondary | ICD-10-CM

## 2020-03-13 DIAGNOSIS — R0602 Shortness of breath: Secondary | ICD-10-CM

## 2020-03-13 LAB — BODY FLUID CELL COUNT WITH DIFFERENTIAL
Eos, Fluid: 0 %
Lymphs, Fluid: 7 %
Monocyte-Macrophage-Serous Fluid: 15 % — ABNORMAL LOW (ref 50–90)
Neutrophil Count, Fluid: 78 % — ABNORMAL HIGH (ref 0–25)
Other Cells, Fluid: 0 %
Total Nucleated Cell Count, Fluid: 390 cu mm (ref 0–1000)

## 2020-03-13 LAB — CBC WITH DIFFERENTIAL/PLATELET
Abs Immature Granulocytes: 0.23 10*3/uL — ABNORMAL HIGH (ref 0.00–0.07)
Basophils Absolute: 0 10*3/uL (ref 0.0–0.1)
Basophils Relative: 0 %
Eosinophils Absolute: 0.1 10*3/uL (ref 0.0–0.5)
Eosinophils Relative: 1 %
HCT: 28.2 % — ABNORMAL LOW (ref 36.0–46.0)
Hemoglobin: 8.9 g/dL — ABNORMAL LOW (ref 12.0–15.0)
Immature Granulocytes: 3 %
Lymphocytes Relative: 3 %
Lymphs Abs: 0.3 10*3/uL — ABNORMAL LOW (ref 0.7–4.0)
MCH: 31.4 pg (ref 26.0–34.0)
MCHC: 31.6 g/dL (ref 30.0–36.0)
MCV: 99.6 fL (ref 80.0–100.0)
Monocytes Absolute: 0.5 10*3/uL (ref 0.1–1.0)
Monocytes Relative: 6 %
Neutro Abs: 6.9 10*3/uL (ref 1.7–7.7)
Neutrophils Relative %: 87 %
Platelets: 49 10*3/uL — ABNORMAL LOW (ref 150–400)
RBC: 2.83 MIL/uL — ABNORMAL LOW (ref 3.87–5.11)
RDW: 19.5 % — ABNORMAL HIGH (ref 11.5–15.5)
WBC: 7.9 10*3/uL (ref 4.0–10.5)
nRBC: 1.6 % — ABNORMAL HIGH (ref 0.0–0.2)

## 2020-03-13 LAB — BASIC METABOLIC PANEL
Anion gap: 12 (ref 5–15)
BUN: 13 mg/dL (ref 6–20)
CO2: 29 mmol/L (ref 22–32)
Calcium: 8.7 mg/dL — ABNORMAL LOW (ref 8.9–10.3)
Chloride: 95 mmol/L — ABNORMAL LOW (ref 98–111)
Creatinine, Ser: 0.66 mg/dL (ref 0.44–1.00)
GFR calc Af Amer: >60 mL/min (ref >60)
GFR calc non Af Amer: >60 mL/min (ref >60)
Glucose, Bld: 112 mg/dL — ABNORMAL HIGH (ref 70–99)
Potassium: 4.1 mmol/L (ref 3.5–5.1)
Sodium: 136 mmol/L (ref 135–145)

## 2020-03-13 LAB — BLOOD GAS, ARTERIAL
Acid-Base Excess: 4.4 mmol/L — ABNORMAL HIGH (ref 0.0–2.0)
Bicarbonate: 27.7 mmol/L (ref 20.0–28.0)
FIO2: 100
O2 Saturation: >100 %
Patient temperature: 98.6
pCO2 arterial: 37.1 mmHg (ref 32.0–48.0)
pH, Arterial: 7.486 — ABNORMAL HIGH (ref 7.350–7.450)
pO2, Arterial: 356 mmHg — ABNORMAL HIGH (ref 83.0–108.0)

## 2020-03-13 LAB — GLUCOSE, CAPILLARY
Glucose-Capillary: 104 mg/dL — ABNORMAL HIGH (ref 70–99)
Glucose-Capillary: 84 mg/dL (ref 70–99)

## 2020-03-13 LAB — MRSA PCR SCREENING: MRSA by PCR: NEGATIVE

## 2020-03-13 LAB — MAGNESIUM: Magnesium: 1.5 mg/dL — ABNORMAL LOW (ref 1.7–2.4)

## 2020-03-13 LAB — PHOSPHORUS: Phosphorus: 4.7 mg/dL — ABNORMAL HIGH (ref 2.5–4.6)

## 2020-03-13 MED ORDER — GUAIFENESIN ER 600 MG PO TB12
1200.0000 mg | ORAL_TABLET | Freq: Two times a day (BID) | ORAL | Status: DC
  Administered 2020-03-14: 10:00:00 1200 mg via ORAL
  Filled 2020-03-13 (×3): qty 2

## 2020-03-13 MED ORDER — PHENYLEPHRINE HCL-NACL 10-0.9 MG/250ML-% IV SOLN
0.0000 ug/min | INTRAVENOUS | Status: DC
  Administered 2020-03-13: 20 ug/min via INTRAVENOUS
  Administered 2020-03-14: 08:00:00 190 ug/min via INTRAVENOUS
  Administered 2020-03-14: 170 ug/min via INTRAVENOUS
  Administered 2020-03-14: 140 ug/min via INTRAVENOUS
  Administered 2020-03-14: 130 ug/min via INTRAVENOUS
  Administered 2020-03-14: 80 ug/min via INTRAVENOUS
  Administered 2020-03-16: 140 ug/min via INTRAVENOUS
  Administered 2020-03-16: 14:00:00 160 ug/min via INTRAVENOUS
  Administered 2020-03-16: 150 ug/min via INTRAVENOUS
  Administered 2020-03-16: 20 ug/min via INTRAVENOUS
  Filled 2020-03-13 (×11): qty 250

## 2020-03-13 MED ORDER — ORAL CARE MOUTH RINSE: 15.0000 mL | Freq: Two times a day (BID) | OROMUCOSAL | Status: DC

## 2020-03-13 MED ORDER — MIDAZOLAM HCL 2 MG/2ML IJ SOLN
INTRAMUSCULAR | Status: AC
  Administered 2020-03-13: 2 mg
  Filled 2020-03-13: qty 2

## 2020-03-13 MED ORDER — ORAL CARE MOUTH RINSE
15.0000 mL | OROMUCOSAL | Status: DC
  Administered 2020-03-14 – 2020-03-16 (×23): 15 mL via OROMUCOSAL

## 2020-03-13 MED ORDER — SODIUM CHLORIDE 0.9 % IV SOLN: INTRAVENOUS | Status: DC | PRN

## 2020-03-13 MED ORDER — FUROSEMIDE 10 MG/ML IJ SOLN
20.0000 mg | Freq: Once | INTRAMUSCULAR | Status: AC
  Administered 2020-03-13: 20 mg via INTRAVENOUS
  Filled 2020-03-13: qty 2

## 2020-03-13 MED ORDER — FENTANYL CITRATE (PF) 100 MCG/2ML IJ SOLN
INTRAMUSCULAR | Status: AC
  Filled 2020-03-13: qty 2

## 2020-03-13 MED ORDER — PRO-STAT SUGAR FREE PO LIQD
30.0000 mL | Freq: Two times a day (BID) | ORAL | Status: DC
  Administered 2020-03-13 – 2020-03-14 (×3): 30 mL
  Filled 2020-03-13 (×3): qty 30

## 2020-03-13 MED ORDER — VITAL HIGH PROTEIN PO LIQD
1000.0000 mL | ORAL | Status: AC
  Administered 2020-03-13: 1000 mL

## 2020-03-13 MED ORDER — FENTANYL CITRATE (PF) 100 MCG/2ML IJ SOLN: 25.0000 ug | INTRAMUSCULAR | Status: DC | PRN

## 2020-03-13 MED ORDER — ETOMIDATE 2 MG/ML IV SOLN
20.0000 mg | Freq: Once | INTRAVENOUS | Status: AC
  Administered 2020-03-13: 20 mg via INTRAVENOUS

## 2020-03-13 MED ORDER — FENTANYL CITRATE (PF) 100 MCG/2ML IJ SOLN
50.0000 ug | Freq: Once | INTRAMUSCULAR | Status: AC
  Administered 2020-03-13: 50 ug via INTRAVENOUS

## 2020-03-13 MED ORDER — CLONAZEPAM 0.125 MG PO TBDP
0.2500 mg | ORAL_TABLET | Freq: Two times a day (BID) | ORAL | Status: DC
  Administered 2020-03-13 – 2020-03-16 (×5): 0.25 mg via ORAL
  Filled 2020-03-13 (×5): qty 2

## 2020-03-13 MED ORDER — DEXMEDETOMIDINE HCL IN NACL 200 MCG/50ML IV SOLN
0.4000 ug/kg/h | INTRAVENOUS | Status: DC
  Administered 2020-03-13: 0.8 ug/kg/h via INTRAVENOUS
  Administered 2020-03-13: 0.756 ug/kg/h via INTRAVENOUS
  Administered 2020-03-13: 1 ug/kg/h via INTRAVENOUS
  Administered 2020-03-13: 0.7 ug/kg/h via INTRAVENOUS
  Administered 2020-03-14: 15:00:00 0.8 ug/kg/h via INTRAVENOUS
  Administered 2020-03-14: 19:00:00 0.9 ug/kg/h via INTRAVENOUS
  Administered 2020-03-14: 22:00:00 1 ug/kg/h via INTRAVENOUS
  Administered 2020-03-14: 08:00:00 0.9 ug/kg/h via INTRAVENOUS
  Administered 2020-03-14: 1 ug/kg/h via INTRAVENOUS
  Administered 2020-03-14 (×2): 0.9 ug/kg/h via INTRAVENOUS
  Administered 2020-03-15: 1.1 ug/kg/h via INTRAVENOUS
  Administered 2020-03-15 (×2): 1 ug/kg/h via INTRAVENOUS
  Administered 2020-03-15 (×2): 1.1 ug/kg/h via INTRAVENOUS
  Administered 2020-03-15: 1.2 ug/kg/h via INTRAVENOUS
  Administered 2020-03-15: 1.1 ug/kg/h via INTRAVENOUS
  Administered 2020-03-16: 11:00:00 1 ug/kg/h via INTRAVENOUS
  Filled 2020-03-13 (×7): qty 50
  Filled 2020-03-13 (×2): qty 100
  Filled 2020-03-13: qty 50
  Filled 2020-03-13: qty 100
  Filled 2020-03-13 (×9): qty 50
  Filled 2020-03-13: qty 100

## 2020-03-13 MED ORDER — GABAPENTIN 250 MG/5ML PO SOLN
100.0000 mg | Freq: Two times a day (BID) | ORAL | Status: DC
  Administered 2020-03-13 – 2020-03-16 (×6): 100 mg
  Filled 2020-03-13 (×7): qty 2

## 2020-03-13 MED ORDER — ROCURONIUM BROMIDE 50 MG/5ML IV SOLN
60.0000 mg | Freq: Once | INTRAVENOUS | Status: AC
  Administered 2020-03-13: 60 mg via INTRAVENOUS
  Filled 2020-03-13: qty 6

## 2020-03-13 MED ORDER — VASOPRESSIN 20 UNIT/ML IV SOLN
0.0400 [IU]/min | INTRAVENOUS | Status: DC
  Administered 2020-03-14 (×2): 0.03 [IU]/min via INTRAVENOUS
  Filled 2020-03-13 (×3): qty 2

## 2020-03-13 MED ORDER — NOREPINEPHRINE 4 MG/250ML-% IV SOLN
0.0000 ug/min | INTRAVENOUS | Status: DC
  Administered 2020-03-13: 7 ug/min via INTRAVENOUS
  Administered 2020-03-13: 11 ug/min via INTRAVENOUS
  Administered 2020-03-14: 4 ug/min via INTRAVENOUS
  Administered 2020-03-15: 5 ug/min via INTRAVENOUS
  Administered 2020-03-15: 6 ug/min via INTRAVENOUS
  Administered 2020-03-16: 17 ug/min via INTRAVENOUS
  Filled 2020-03-13 (×7): qty 250

## 2020-03-13 MED ORDER — FENTANYL 2500MCG IN NS 250ML (10MCG/ML) PREMIX INFUSION
0.0000 ug/h | INTRAVENOUS | Status: DC
  Administered 2020-03-13: 75 ug/h via INTRAVENOUS
  Administered 2020-03-14: 04:00:00 275 ug/h via INTRAVENOUS
  Administered 2020-03-14: 15:00:00 200 ug/h via INTRAVENOUS
  Administered 2020-03-15: 300 ug/h via INTRAVENOUS
  Filled 2020-03-13 (×5): qty 250

## 2020-03-13 MED ORDER — NOREPINEPHRINE 4 MG/250ML-% IV SOLN
INTRAVENOUS | Status: AC
  Filled 2020-03-13: qty 250

## 2020-03-13 MED ORDER — CHLORHEXIDINE GLUCONATE CLOTH 2 % EX PADS
6.0000 | MEDICATED_PAD | Freq: Every day | CUTANEOUS | Status: DC
  Administered 2020-03-14 – 2020-03-15 (×2): 6 via TOPICAL

## 2020-03-13 MED ORDER — LACTATED RINGERS IV BOLUS
500.0000 mL | Freq: Once | INTRAVENOUS | Status: AC
  Administered 2020-03-13: 500 mL via INTRAVENOUS

## 2020-03-13 MED ORDER — ACETAMINOPHEN 160 MG/5ML PO SOLN
650.0000 mg | Freq: Four times a day (QID) | ORAL | Status: DC | PRN
  Administered 2020-03-14 – 2020-03-16 (×6): 650 mg
  Filled 2020-03-13 (×6): qty 20.3

## 2020-03-13 MED ORDER — METOPROLOL TARTRATE 5 MG/5ML IV SOLN
2.5000 mg | INTRAVENOUS | Status: DC | PRN
  Administered 2020-03-13: 2.5 mg via INTRAVENOUS
  Filled 2020-03-13: qty 5

## 2020-03-13 MED ORDER — METOPROLOL TARTRATE 12.5 MG HALF TABLET: 12.5000 mg | ORAL_TABLET | Freq: Two times a day (BID) | ORAL | Status: DC

## 2020-03-13 MED ORDER — SODIUM CHLORIDE 3 % IN NEBU
4.0000 mL | INHALATION_SOLUTION | Freq: Three times a day (TID) | RESPIRATORY_TRACT | Status: DC
  Filled 2020-03-13 (×2): qty 4

## 2020-03-13 MED ORDER — LORAZEPAM 2 MG/ML IJ SOLN
1.0000 mg | Freq: Once | INTRAMUSCULAR | Status: AC
  Administered 2020-03-13: 1 mg via INTRAVENOUS
  Filled 2020-03-13: qty 1

## 2020-03-13 MED ORDER — VANCOMYCIN HCL 1500 MG/300ML IV SOLN
1500.0000 mg | Freq: Once | INTRAVENOUS | Status: AC
  Administered 2020-03-13: 1500 mg via INTRAVENOUS
  Filled 2020-03-13: qty 300

## 2020-03-13 MED ORDER — DOCUSATE SODIUM 50 MG/5ML PO LIQD
100.0000 mg | Freq: Two times a day (BID) | ORAL | Status: DC
  Administered 2020-03-13 – 2020-03-16 (×6): 100 mg
  Filled 2020-03-13 (×6): qty 10

## 2020-03-13 MED ORDER — CHLORHEXIDINE GLUCONATE 0.12% ORAL RINSE (MEDLINE KIT)
15.0000 mL | Freq: Two times a day (BID) | OROMUCOSAL | Status: DC
  Administered 2020-03-13 – 2020-03-16 (×6): 15 mL via OROMUCOSAL

## 2020-03-13 MED ORDER — GABAPENTIN 100 MG PO CAPS: 100.0000 mg | ORAL_CAPSULE | Freq: Two times a day (BID) | ORAL | Status: DC

## 2020-03-13 MED ORDER — ACETYLCYSTEINE 20 % IN SOLN
2.0000 mL | Freq: Three times a day (TID) | RESPIRATORY_TRACT | Status: DC
  Administered 2020-03-13: 2 mL via RESPIRATORY_TRACT
  Filled 2020-03-13 (×2): qty 4

## 2020-03-13 NOTE — Consult Note (Signed)
NAME:  Terri Estes, MRN:  [id number], DOB:  1986-05-30, LOS: 2 ADMISSION DATE:  03/18/2020, CONSULTATION DATE:  03/13/2020 REFERRING MD:  Dr. Irene Pap, CHIEF COMPLAINT:  DOB   Brief History   34yo female with history of metastatic breast cancer with recurrent right pleural effusion presented with dyspnea  And hypoxia at outside oncologist office.  History of present illness   Terri Estes is a 34 yo female with PMX significant for metastatic breast cancer, recurrent malignant R pleural effusion, GERD, asthma, depression/anxiety, chronic pain, and psoriasis who presented dyspneic and hypoxic at primary oncologist office at which time patient was sent to the emergency department. Of note patient was recently admitted from 03/04/2020-03/06/2020 for similar presentation were she was diagnosed with pulmonary embolism and received thoracentesis 03/05/2020 with plans to place PleurX cath if effusions continue to develop.   PCCM consulted 03/13/2020 due to progressive dyspnea. Repeat CXR 03/13/2020 with worsening opacification of the right hemithorax with layering effusion.   Past Medical History  Right breast cancer on chemo/ radiation and s/p right mastectomy and axillary lymph node dissection 10/16/2019, tobacco abuse, asthma, anemia, anxiety, depression, GERD, psoriasis, polysubstance abuse (THC and cocaine positive 12/13/2019)  Significant Hospital Events    Consults:  Oncology IR  Procedures:  Prior hospitalization Thoracentesis 03/05/2020 > 570cc bloody fluid removed   Significant Diagnostic Tests:  Prior hospitalization  CTA chest 03/04/2020 3/9 CTA PE >> Tiny left lower lobe pulmonary embolus. Increase in right-sided pleural effusion with evidence of pleural enhancement suggestive of metastatic deposits. Increase in the degree of right lung consolidation secondary to central adenopathy and likely underlying mass. Increase in size and number of metastatic lesions within the  liver as described. Increase in size of nodular changes within the left lung as well as some new ground-glass opacities. Correlation with the pending COVID-19 testing is recommended. Healing fracture of the right anterior eighth rib. Increased compression deformity at T1 likely pathologic in nature.   CXR 03/13/2020 > 1. Worsening opacification of the right hemithorax with layering effusion. 2. Left pleural effusion with hazy interstitial and mixed patchy opacities. 3. Developing infection is difficult to exclude given the extensive areas obscured by overlying opacity. 4. Left Port-A-Cath tip near the superior cavoatrial junction.  Micro Data:  COVID 3/18 > Negative  MRSA PCR 3/18 >  Negative   Antimicrobials:  Cefazolin 3/18 > Valacyclovir 3/18 >   Interim history/subjective:  Sitting up in bed, complains or frustration regarding hospitalization. Continues to endorse dyspnea.   Objective   Blood pressure (!) 131/99, pulse (!) 129, temperature (!) 97.4 F (36.3 C), temperature source Axillary, resp. rate (!) 24, height 5\' 2"  (1.575 m), weight 65.1 kg, SpO2 100 %, not currently breastfeeding.        Intake/Output Summary (Last 24 hours) at 03/13/2020 1225 Last data filed at 03/12/2020 2200 Gross per 24 hour  Intake 820 ml  Output --  Net 820 ml   Filed Weights   03/23/2020 2259  Weight: 65.1 kg    Examination: General: Chronically ill appearing adult female lying in bed, in mild respiratory distress HEENT: [place]/AT, MM pink/moist, PERRL,  Neuro: Alert and oriented, slightly lethargic, able to follow all commands CV: s1s2 regular rate and rhythm, no murmur, rubs, or gallops,  PULM:  Bilateral rhonchi left greater than right, increased work of breathing, mild use of accessory muscles  GI: soft, bowel sounds active in all 4 quadrants, non-tender, non-distended Extremities: warm/dry, no edema  Skin: psoriasis  Resolved Hospital Problem list     Assessment & Plan:   Acute hypoxic respiratory insufficiency  Metastatic breast cancer with malignant pleural effusion  Large left pleural effusion  History of tiny left PE  -Known history of right breast T4 N2 M1, stageIVinvasive ductal carcinoma, grade 3, triple negative, with an MIB-1 of 90% diagnoses  04/19/2019 P: Will plan for placement of PleurX cath today  Consult TOC team for supply management at discharge  Continue to hold Xarelto  No need to send pleural fluid as effusion has already been confirmed malignant  Will need pulmonary referral at discharge  Pain and anxiety management    Rest of chronic medical conditions managed by primary   PCCM will continue to follow   Best practice:  Diet: NPO Pain/Anxiety/Delirium protocol (if indicated): PRNs VAP protocol (if indicated): N/A DVT prophylaxis: Xarelto on hold can resume later today  GI prophylaxis: PPI Glucose control: Monitor  Mobility: Bedrest  Code Status: Full Family Communication: Will update  Disposition: ICU   Labs   CBC: Recent Labs  Lab 03/20/2020 1750 03/12/20 0020 03/12/20 1032 03/13/20 0350  WBC 8.2  --  7.9 7.9  NEUTROABS 7.4  --   --  6.9  HGB 7.2* 7.5* 8.6* 8.9*  HCT 23.0* 24.2* 27.6* 28.2*  MCV 100.4*  --  98.6 99.6  PLT 90*  --  67* 49*    Basic Metabolic Panel: Recent Labs  Lab 03/18/2020 1750 03/12/20 1032 03/13/20 0350  NA 136 134* 136  K 4.2 3.8 4.1  CL 98 95* 95*  CO2 29 29 29   GLUCOSE 113* 103* 112*  BUN 19 17 13   CREATININE 0.51 0.69 0.66  CALCIUM 8.8* 8.8* 8.7*   GFR: Estimated Creatinine Clearance: 90.2 mL/min (by C-G formula based on SCr of 0.66 mg/dL). Recent Labs  Lab 03/02/2020 1750 03/12/20 1032 03/13/20 0350  WBC 8.2 7.9 7.9    Liver Function Tests: Recent Labs  Lab 03/12/20 1032  AST 39  ALT 36  ALKPHOS 89  BILITOT 0.9  PROT 5.9*  ALBUMIN 2.5*   No results for input(s): LIPASE, AMYLASE in the last 168 hours. No results for input(s): AMMONIA in the last 168  hours.  ABG    Component Value Date/Time   HCO3 TEST WILL BE CREDITED 07/10/2017 2127     Coagulation Profile: No results for input(s): INR, PROTIME in the last 168 hours.  Cardiac Enzymes: No results for input(s): CKTOTAL, CKMB, CKMBINDEX, TROPONINI in the last 168 hours.  HbA1C: Hgb A1c MFr Bld  Date/Time Value Ref Range Status  12/16/2016 01:39 PM 5.3 4.8 - 5.6 % Final    Comment:             Pre-diabetes: 5.7 - 6.4          Diabetes: >6.4          Glycemic control for adults with diabetes: <7.0     CBG: No results for input(s): GLUCAP in the last 168 hours.  Review of Systems: Positives in bold   Gen: Denies fever, chills, weight change, fatigue, night sweats HEENT: Denies blurred vision, double vision, hearing loss, tinnitus, sinus congestion, rhinorrhea, sore throat, neck stiffness, dysphagia PULM: Denies shortness of breath, cough, sputum production, hemoptysis, wheezing CV: Denies chest pain, edema, orthopnea, paroxysmal nocturnal dyspnea, palpitations GI: Denies abdominal pain, nausea, vomiting, diarrhea, hematochezia, melena, constipation, change in bowel habits GU: Denies dysuria, hematuria, polyuria, oliguria, urethral discharge Endocrine: Denies hot or cold intolerance, polyuria, polyphagia or  appetite change Derm: Denies rash, dry skin, scaling or peeling skin change Heme: Denies easy bruising, bleeding, bleeding gums Neuro: Denies headache, numbness, weakness, slurred speech, loss of memory or consciousness  Past Medical History  She,  has a past medical history of Anemia, Anxiety, Asthma, Cancer ([place]), Chronic pain, Depression, Family history of breast cancer, GERD (gastroesophageal reflux disease), History of substance abuse ([place]), Pregnancy induced hypertension, Psoriasis, Trichomonas infection, and Vaginal Pap smear, abnormal.   Surgical History    Past Surgical History:  Procedure Laterality Date  . CESAREAN SECTION  05/26/2008  . CESAREAN SECTION  N/A 07/10/2017   Procedure: CESAREAN SECTION;  Surgeon: Florian Buff, MD;  Location: [facility];  Service: Obstetrics;  Laterality: N/A;  . CESAREAN SECTION N/A 09/23/2018   Procedure: CESAREAN SECTION;  Surgeon: Donnamae Jude, MD;  Location: [facility];  Service: Obstetrics;  Laterality: N/A;  . COLPOSCOPY    . MASTECTOMY WITH AXILLARY LYMPH NODE DISSECTION Right 10/16/2019   Procedure: RIGHT MASTECTOMY WITH AXILLARY LYMPH NODE DISSECTION;  Surgeon: Alphonsa Overall, MD;  Location: [facility];  Service: General;  Laterality: Right;  . PORTACATH PLACEMENT Left 04/30/2019   Procedure: INSERTION PORT-A-CATH WITH ULTRASOUND;  Surgeon: Alphonsa Overall, MD;  Location: [facility];  Service: General;  Laterality: Left;  Left Subclavian vein  . WISDOM TOOTH EXTRACTION       Social History   reports that she has been smoking cigarettes. She has been smoking about 0.10 packs per day for the past 0.00 years. She has never used smokeless tobacco. She reports current drug use. Frequency: 7.00 times per week. Drug: Marijuana. She reports that she does not drink alcohol.   Family History   Her family history includes Breast cancer in her paternal aunt, paternal grandmother, paternal great-grandmother, and another family member; Breast cancer (age of onset: 6) in her maternal grandmother; Cerebral palsy in her child; Diabetes in her paternal grandmother; Kidney disease in her maternal grandfather, maternal grandmother, paternal grandfather, and paternal grandmother; Pancreatic cancer in an other family member; Throat cancer in her paternal uncle.   Allergies No Known Allergies   Home Medications  Prior to Admission medications   Medication Sig Start Date End Date Taking? Authorizing Provider  albuterol (VENTOLIN HFA) 108 (90 Base) MCG/ACT inhaler Inhale 2 puffs into the lungs every 6 (six) hours as needed for wheezing or shortness of breath. 02/15/20  Yes Magrinat, [name], MD  benzonatate (TESSALON) 100  MG capsule Take 1 capsule (100 mg total) by mouth 3 (three) times daily as needed for cough. 03/06/20  Yes Rai, Ripudeep K, MD  buprenorphine (SUBUTEX) 8 MG SUBL SL tablet Place 8 mg under the tongue 2 (two) times daily.    Yes [provider]  capecitabine (XELODA) 500 MG tablet Take 2 tablets (1,000 mg total) by mouth 2 (two) times daily after a meal. Take Monday-Friday. Take only on days of radiation. 02/08/20  Yes Magrinat, [name], MD  cholestyramine (QUESTRAN) 4 g packet Take 1 packet (4 g total) by mouth 3 (three) times daily with meals. Patient taking differently: Take 4 g by mouth 3 (three) times daily with meals as needed (diarrhea).  05/11/19  Yes Magrinat, [name], MD  clonazePAM (KLONOPIN) 1 MG tablet Take 1 tablet (1 mg total) by mouth at bedtime as needed for anxiety. 03/06/20 03/06/21 Yes Rai, Ripudeep K, MD  cyclobenzaprine (FLEXERIL) 10 MG tablet Take 1 tablet (10 mg total) by mouth 3 (three) times daily as  needed for muscle spasms. 09/26/18  Yes Donnamae Jude, MD  dexamethasone (DECADRON) 4 MG tablet Take 1 tablet (4 mg total) by mouth daily with breakfast. 02/15/20  Yes Magrinat, [name], MD  Famotidine (PEPCID PO) Take 1 tablet by mouth daily.   Yes [provider]  gabapentin (NEURONTIN) 300 MG capsule TAKE 1 CAPSULE(300 MG) BY MOUTH THREE TIMES DAILY Patient taking differently: Take 300 mg by mouth 3 (three) times daily.  09/10/19  Yes Causey, Charlestine Massed, NP  guaiFENesin 200 MG tablet Take 200 mg by mouth every 4 (four) hours as needed for cough or to loosen phlegm.   Yes [provider]  HYDROcodone-homatropine (HYCODAN) 5-1.5 MG/5ML syrup Take 5 mLs by mouth every 6 (six) hours as needed for cough. 03/06/20  Yes Rai, Ripudeep K, MD  metoCLOPramide (REGLAN) 10 MG tablet Take 1 tablet (10 mg total) by mouth every 8 (eight) hours as needed for nausea. 11/29/19  Yes Caccavale, Sophia, PA-C  ondansetron (ZOFRAN) 8 MG tablet Take 1 tablet (8 mg total) by  mouth every 8 (eight) hours as needed for nausea or vomiting. 11/21/19  Yes Magrinat, [name], MD  oxyCODONE (OXY IR/ROXICODONE) 5 MG immediate release tablet Take 1 tablet (5 mg total) by mouth every 6 (six) hours as needed for severe pain. 02/29/20  Yes Kyung Rudd, MD  Rivaroxaban 15 & 20 MG TBPK Follow package directions: Take one 15mg  tablet by mouth twice a day. On day 22 (03/26/2020), switch to one 20mg  tablet once a day. Take with food. 03/06/20  Yes Rai, Ripudeep K, MD  valACYclovir (VALTREX) 1000 MG tablet Take 1 tablet (1,000 mg total) by mouth 2 (two) times daily. Patient taking differently: Take 1,000 mg by mouth 2 (two) times daily as needed (viral outbreak).  06/26/19  Yes Causey, Charlestine Massed, NP  albuterol (VENTOLIN HFA) 108 (90 Base) MCG/ACT inhaler Inhale 2 puffs into the lungs 4 (four) times daily. Patient not taking: Reported on 03/21/2020 02/15/20   Magrinat, [name], MD  rivaroxaban (XARELTO) 20 MG TABS tablet Take 1 tablet (20 mg total) by mouth daily with supper. Please start after the starter pack has been completed 03/26/20   Rai, Ripudeep K, MD  enalapril (VASOTEC) 10 MG tablet TAKE 1 TABLET BY MOUTH DAILY Patient taking differently: Take 10 mg by mouth daily.  03/15/19   Donnamae Jude, MD     Critical care time:    Performed by: Johnsie Cancel  Total critical care time: 39 minutes  Critical care time was exclusive of separately billable procedures and treating other patients.  Critical care was necessary to treat or prevent imminent or life-threatening deterioration.  Critical care was time spent personally by me on the following activities: development of treatment plan with patient and/or surrogate as well as nursing, discussions with consultants, evaluation of patient's response to treatment, examination of patient, obtaining history from patient or surrogate, ordering and performing treatments and interventions, ordering and review of laboratory studies, ordering  and review of radiographic studies, pulse oximetry and re-evaluation of patient's condition.  Johnsie Cancel, NP-C [facility] Contact / Pager information can be found on Amion  03/13/2020, 1:04 PM

## 2020-03-13 NOTE — Procedures (Signed)
Central Venous Catheter Insertion Procedure Note Terri Estes [id number] [birth year]  Procedure: Insertion of Central Venous Catheter Indications: Drug and/or fluid administration  Procedure Details Consent: Risks of procedure as well as the alternatives and risks of each were explained to the (patient/caregiver).  Consent for procedure obtained. Time Out: Verified patient identification, verified procedure, site/side was marked, verified correct patient position, special equipment/implants available, medications/allergies/relevent history reviewed, required imaging and test results available.  Performed  Maximum sterile technique was used including antiseptics, cap, gloves, gown, hand hygiene, mask and sheet. Skin prep: Chlorhexidine; local anesthetic administered A antimicrobial bonded/coated triple lumen catheter was placed in the right internal jugular vein using the Seldinger technique.  Evaluation Blood flow good Complications: No apparent complications Patient did tolerate procedure well. Chest X-ray ordered to verify placement.  CXR: pending.  Terri Estes 03/13/2020, 3:27 PM

## 2020-03-13 NOTE — Procedures (Signed)
Intubation Procedure Note Terri Estes [id number] 04/14/1986  Procedure: Intubation Indications: Respiratory insufficiency  Procedure Details Consent: Unable to obtain consent because of emergent medical necessity. Time Out: Verified patient identification, verified procedure, site/side was marked, verified correct patient position, special equipment/implants available, medications/allergies/relevent history reviewed, required imaging and test results available.  Performed  Maximum sterile technique was used including antiseptics, cap, gloves, gown, hand hygiene and mask.  MAC and 4    Evaluation Hemodynamic Status: BP stable throughout; O2 sats: stable throughout Patient's Current Condition: stable Complications: No apparent complications Patient did tolerate procedure well. Chest X-ray ordered to verify placement.  CXR: pending.   Terri Estes 03/13/2020

## 2020-03-13 NOTE — Procedures (Signed)
Tunneled pleural catheter placement with imaging guidance (CPT 32550)  Indication: Recurrent malignant effusion  Consent: Signed in chart  Surgeon: Erskine Emery MD  Anesthesia: Ativan 1 mg  Procedure - Timeout performed - Site of effusion marked with [place] - Area cleaned, prepped, and draped - 10cc 1% subcutaneous lidocaine injected to anesthetize area - 1.5 cm incision made overlying fluid and another about 5 cm anterior to this along chest wall - PleurX catheter inserted in usual sterile fashion using modified seldinger technique - Cathter hooked to suction - After fluid aspirated, pleurX capped and sterile dressing applied  Findings - 400 cc's of amber fluid obtained - CXR pending  Specimen(s): None  Complications: none immediate

## 2020-03-13 NOTE — Progress Notes (Signed)
I was contacted by Dr Tamala Julian re results of bronchoscopy and Patra's current situation. I alerted Radiation Oncology so they can consider palliative radiation to the right hilar area. I am also placing a consult to palliative care so they can start addressing goals of care with the family in case things take a turn for the worse.  Terri Estes has an appointment with me 03/25 to discuss sacituzumab treatment. It would not be unreasonable for her to try that if her functional status allows it.  Will follow with you.  GM

## 2020-03-13 NOTE — Progress Notes (Signed)
   03/13/20 0223  Vitals  Temp (!) 101.5 F (38.6 C)  Temp Source Oral  MEWS Score  MEWS Temp 2  MEWS Systolic 0  MEWS Pulse 3  MEWS RR 2  MEWS LOC 0  MEWS Score 7  MEWS Score Color Red  M. Sharlet Salina NP paged to notify of Red Mews and new onset fever. M. Sharlet Salina NP spoke with rapid response Lanelle Bal. RN about transfer to stepdown. Rapid response RN- Lanelle Bal. aware of patient status. Order placed for transfer to stepdown. Report given to [place] RN in ICU. Patient transferred with all belongings to room 1224.

## 2020-03-13 NOTE — Progress Notes (Signed)
[facility] Progress Note Patient Name: TIAUNNA STOCKWELL DOB: 06-19-1986 MRN: [id number]   Date of Service  03/13/2020  HPI/Events of Note  RN requests A-line to help management of severe shock.   eICU Interventions  Agree with RN this would be useful given high dose pressor requirement and overall hemodynamic instability.  Arterial line order placed.  Also d/c'd an earlier order for metoprolol that was still available as she is unlikely to tolerate AV nodal agents at this point.     Intervention Category Major Interventions: Shock - evaluation and management  Charlott Rakes 03/13/2020, 11:38 PM

## 2020-03-13 NOTE — Progress Notes (Signed)
[facility] Progress Note Patient Name: JENNIVER PURCELL DOB: 01-19-1986 MRN: [id number]   Date of Service  03/13/2020  HPI/Events of Note  Hypotensive requiring 11 mcg/min of levophed, but HR now 166 bpm. CVP 2. RN also requests fentanyl IV push PRN for pain/agitation breakthrough, as well as order for -20 cm H2O suction for PleurX as per Dr. Thompson Caul note from earlier today.   eICU Interventions  Start Neo + Vaso for shock, decreasing levophed drip to minimum possible while maintaining MAP goal. May help with HR.  Also give 500cc LR bolus. Will give additional fluid only if beneficial effect on HR/BP observed.  Fentanyl IV pushes ordered Q2H PRN.  Chest tube to suction ordered.     Intervention Category Major Interventions: Respiratory failure - evaluation and management;Hypotension - evaluation and management;Arrhythmia - evaluation and management  Charlott Rakes 03/13/2020, 10:28 PM

## 2020-03-13 NOTE — Telephone Encounter (Signed)
Spoke with ICU nurse patient is on hold for right now

## 2020-03-13 NOTE — Procedures (Signed)
Bronchoscopy  Indication: rule out right mainstem mucus plugging  Consent: discussed with patient prior to intubation  Anesthesia: in place from intubation  Procedure - Timeout performed - Bronchoscope advanced through ETT - Airways examined down to subsegmental level - Following airway examination, BAL performed in RLL  Findings - Extrinsic compression of RUL and bronchus intermedius at secondary carina.  Segmental airways open but nothing open beyond that.  Specimen(s): BAL RLL  Complications: None immediate

## 2020-03-13 NOTE — Progress Notes (Addendum)
Received a request by bedside RN to come assess the patient at bedside due to increase use of accessory muscles.  Came in the room.  Patient is sleeping.  Audible ronchorous sounds noted.  Reviewed latest CXR once again, complete R side white out.  Possible mucous plug.  Started on mucomyst neb this AM with suctioning by RT.  Added small dose IV lasix, hypersaline nebs and flutter valve.  Discussed with PCCM, will see in consultation.  Patient is easily arousable and answers questions appropriately.

## 2020-03-13 NOTE — Progress Notes (Signed)
PROGRESS NOTE  Terri Estes [id number] DOB: Nov 08, 1986 DOA: 03/05/2020 PCP: Patient, No Pcp Per  HPI/Recap of past 24 hours: Terri Estes is a 34 y.o. female with medical history significant of metastatic breast cancer, recurrent malignant R pleural effusion, GERD, asthma, depression/anxiety, chronic pain, psoriasis who was just discharged from the hospital on March 11 after admission from March 9 due to worsening shortness of breath.  Patient was found to have pulmonary embolism at the time with large right pleural effusion requiring thoracentesis on March 10.  At that point plan was to put a Pleurx catheter if this recurs.  Patient went for her follow-up with oncology Dr. Jana Hakim on the day of presentation and was found to have dyspnea with hypoxia. Imaging shows reaccumulation of her R pleural effusion and significant atelectasis.  She was sent to the ER for further evaluation.  TR[facility] asked to admit.  Planned thoracentesis on 3/18 by IR.  Xarelto on hold. Has 3 sessions left of radiation.  Transferred to stepdown unit overnight due to concern for worsening respiratory status.  03/13/20: Audible rhonchorous sounds noted this morning.  Mucomyst nebs and RT suctioning started.  Personally reviewed chest x-ray done earlier this morning which showed whiteout of the right lung.  Possible thoracentesis today by IR.   Assessment/Plan: Principal Problem:   Malignant pleural effusion Active Problems:   Acute blood loss anemia   Acute respiratory failure (HCC)   Pulmonary embolism (HCC)  Acute hypoxic respiratory failure likely secondary to recurrent malignant right pleural effusion Presented to her oncologist with dyspnea and hypoxia Currently requiring 2 L to maintain O2 saturation greater than 92%. Not on oxygen supplementation at baseline Personally reviewed chest x-ray done earlier this morning which showed whiteout of the right lung.  Possible thoracentesis today by  IR. Mucomyst nebs and RT suctioning started.   Acute thrombocytopenia possibly secondary to acute illness in the setting of malignancy Platelet count continues to trend down 49K from 67K from 90 K Defer to oncology to manage No overt signs of bleeding  Anemia of chronic disease in the setting of malignancy Baseline hemoglobin appears to be 10 Presented with hemoglobin of 7.2 with MCV of 94 No overt signs of bleeding Hemoglobin stable at 8.9. Monitor H&H  Recent pulmonary embolism Xarelto on hold due to planned procedure Continue to hold off Xarelto, defer to interventional radiology to restart.  Metastatic breast cancer Followed by Dr. Jana Hakim, medical oncology and Dr. Lisbeth Renshaw, radiation oncology She has 3 sessions of radiation for left Will possibly resume radiation 3/18 if hemodynamically stable Management per oncology  Chronic depression/anxiety Continue home medications Caution with use of opiates and benzodiazepine, will use judiciously.  Chronic pain syndrome Management as stated above  GERD Continue home regimen  DVT prophylaxis: SCDs.  Not on chemical DVT prophylaxis due to worsening thrombocytopenia.  Code Status: Full code  Family Communication: Discussed care with the patient  Disposition Plan:  Patient is from home.  Anticipate discharge to home in the next 72 hours.  Barrier to discharge, pending procedures by interventional radiology and radiation oncology.   Consults called: Interventional radiology, and oncology   Objective: Vitals:   03/13/20 0734 03/13/20 0800 03/13/20 0802 03/13/20 0900  BP:  (!) 92/54 (!) 115/102 (!) 145/84  Pulse:  (!) 139 (!) 137 (!) 124  Resp:  (!) 27 20 (!) 26  Temp:  (!) 97.4 F (36.3 C)    TempSrc:  Axillary    SpO2: 95% 100% 100%  100%  Weight:      Height:        Intake/Output Summary (Last 24 hours) at 03/13/2020 1029 Last data filed at 03/12/2020 2200 Gross per 24 hour  Intake 1060 ml  Output --  Net 1060 ml    Filed Weights   03/18/2020 2259  Weight: 65.1 kg    Exam:  . General: 34 y.o. year-old female well-developed and nourished appears uncomfortable due to hypoxia.  Alert and oriented x3.   . Cardiovascular: Tachycardic with no rubs or gallops.   Marland Kitchen Respiratory: Audible rhonchorous sounds noted.   . Abdomen: Soft nontender nondistended.   . Musculoskeletal: No lower extremity edema bilaterally. Marland Kitchen Psychiatry: Mood is appropriate for condition and setting..   Data Reviewed: CBC: Recent Labs  Lab 03/03/2020 1750 03/12/20 0020 03/12/20 1032 03/13/20 0350  WBC 8.2  --  7.9 7.9  NEUTROABS 7.4  --   --  6.9  HGB 7.2* 7.5* 8.6* 8.9*  HCT 23.0* 24.2* 27.6* 28.2*  MCV 100.4*  --  98.6 99.6  PLT 90*  --  67* 49*   Basic Metabolic Panel: Recent Labs  Lab 02/29/2020 1750 03/12/20 1032 03/13/20 0350  NA 136 134* 136  K 4.2 3.8 4.1  CL 98 95* 95*  CO2 29 29 29   GLUCOSE 113* 103* 112*  BUN 19 17 13   CREATININE 0.51 0.69 0.66  CALCIUM 8.8* 8.8* 8.7*   GFR: Estimated Creatinine Clearance: 90.2 mL/min (by C-G formula based on SCr of 0.66 mg/dL). Liver Function Tests: Recent Labs  Lab 03/12/20 1032  AST 39  ALT 36  ALKPHOS 89  BILITOT 0.9  PROT 5.9*  ALBUMIN 2.5*   No results for input(s): LIPASE, AMYLASE in the last 168 hours. No results for input(s): AMMONIA in the last 168 hours. Coagulation Profile: No results for input(s): INR, PROTIME in the last 168 hours. Cardiac Enzymes: No results for input(s): CKTOTAL, CKMB, CKMBINDEX, TROPONINI in the last 168 hours. BNP (last 3 results) No results for input(s): PROBNP in the last 8760 hours. HbA1C: No results for input(s): HGBA1C in the last 72 hours. CBG: No results for input(s): GLUCAP in the last 168 hours. Lipid Profile: No results for input(s): CHOL, HDL, LDLCALC, TRIG, CHOLHDL, LDLDIRECT in the last 72 hours. Thyroid Function Tests: No results for input(s): TSH, T4TOTAL, FREET4, T3FREE, THYROIDAB in the last 72  hours. Anemia Panel: No results for input(s): VITAMINB12, FOLATE, FERRITIN, TIBC, IRON, RETICCTPCT in the last 72 hours. Urine analysis:    Component Value Date/Time   COLORURINE YELLOW 11/29/2019 0138   APPEARANCEUR HAZY (A) 11/29/2019 0138   LABSPEC 1.020 11/29/2019 0138   PHURINE 6.0 11/29/2019 0138   GLUCOSEU NEGATIVE 11/29/2019 0138   HGBUR NEGATIVE 11/29/2019 0138   BILIRUBINUR NEGATIVE 11/29/2019 0138   KETONESUR 5 (A) 11/29/2019 0138   PROTEINUR NEGATIVE 11/29/2019 0138   UROBILINOGEN 4.0 (H) 01/13/2018 1417   NITRITE NEGATIVE 11/29/2019 0138   LEUKOCYTESUR MODERATE (A) 11/29/2019 0138   Sepsis Labs: @LABRCNTIP (procalcitonin:4,lacticidven:4)  ) Recent Results (from the past 240 hour(s))  Respiratory Panel by RT PCR (Flu A&B, Covid) - Nasopharyngeal Swab     Status: None   Collection Time: 03/04/20  4:20 PM   Specimen: Nasopharyngeal Swab  Result Value Ref Range Status   SARS Coronavirus 2 by RT PCR NEGATIVE NEGATIVE Final    Comment: (NOTE) SARS-CoV-2 target nucleic acids are NOT DETECTED. The SARS-CoV-2 RNA is generally detectable in upper respiratoy specimens during the acute phase of infection.  The lowest concentration of SARS-CoV-2 viral copies this assay can detect is 131 copies/mL. A negative result does not preclude SARS-Cov-2 infection and should not be used as the sole basis for treatment or other patient management decisions. A negative result may occur with  improper specimen collection/handling, submission of specimen other than nasopharyngeal swab, presence of viral mutation(s) within the areas targeted by this assay, and inadequate number of viral copies (<131 copies/mL). A negative result must be combined with clinical observations, patient history, and epidemiological information. The expected result is Negative. Fact Sheet for Patients:  PinkCheek.be Fact Sheet for Healthcare Providers:   GravelBags.it This test is not yet ap proved or cleared by the [place] FDA and  has been authorized for detection and/or diagnosis of SARS-CoV-2 by FDA under an Emergency Use Authorization (EUA). This EUA will remain  in effect (meaning this test can be used) for the duration of the COVID-19 declaration under Section 564(b)(1) of the Act, 21 U.S.C. section 360bbb-3(b)(1), unless the authorization is terminated or revoked sooner.    Influenza A by PCR NEGATIVE NEGATIVE Final   Influenza B by PCR NEGATIVE NEGATIVE Final    Comment: (NOTE) The Xpert Xpress SARS-CoV-2/FLU/RSV assay is intended as an aid in  the diagnosis of influenza from Nasopharyngeal swab specimens and  should not be used as a sole basis for treatment. Nasal washings and  aspirates are unacceptable for Xpert Xpress SARS-CoV-2/FLU/RSV  testing. Fact Sheet for Patients: PinkCheek.be Fact Sheet for Healthcare Providers: GravelBags.it This test is not yet approved or cleared by the [place] FDA and  has been authorized for detection and/or diagnosis of SARS-CoV-2 by  FDA under an Emergency Use Authorization (EUA). This EUA will remain  in effect (meaning this test can be used) for the duration of the  Covid-19 declaration under Section 564(b)(1) of the Act, 21  U.S.C. section 360bbb-3(b)(1), unless the authorization is  terminated or revoked. Performed at [facility], [place] [address] [place], [place] [address]   Culture, body fluid-bottle     Status: None   Collection Time: 03/05/20 12:23 PM   Specimen: Fluid  Result Value Ref Range Status   Specimen Description FLUID PLEURAL  Final   Special Requests   Final    BOTTLES DRAWN AEROBIC AND ANAEROBIC 10CC Performed at [facility], [address], [place], [place] [address]    Culture NO GROWTH 5 DAYS  Final   Report Status 03/10/2020 FINAL   Final  Gram stain     Status: None   Collection Time: 03/05/20 12:23 PM   Specimen: Fluid  Result Value Ref Range Status   Specimen Description FLUID PLEURAL  Final   Special Requests NONE  Final   Gram Stain   Final    RARE WBC PRESENT,BOTH PMN AND MONONUCLEAR NO ORGANISMS SEEN Performed at [facility], [place] [address], [place], [place] [address]    Report Status 03/05/2020 FINAL  Final  Respiratory Panel by RT PCR (Flu A&B, Covid) - Nasopharyngeal Swab     Status: None   Collection Time: 02/25/2020  7:00 PM   Specimen: Nasopharyngeal Swab  Result Value Ref Range Status   SARS Coronavirus 2 by RT PCR NEGATIVE NEGATIVE Final    Comment: (NOTE) SARS-CoV-2 target nucleic acids are NOT DETECTED. The SARS-CoV-2 RNA is generally detectable in upper respiratoy specimens during the acute phase of infection. The lowest concentration of SARS-CoV-2 viral copies this assay can detect is 131 copies/mL.  A negative result does not preclude SARS-Cov-2 infection and should not be used as the sole basis for treatment or other patient management decisions. A negative result may occur with  improper specimen collection/handling, submission of specimen other than nasopharyngeal swab, presence of viral mutation(s) within the areas targeted by this assay, and inadequate number of viral copies (<131 copies/mL). A negative result must be combined with clinical observations, patient history, and epidemiological information. The expected result is Negative. Fact Sheet for Patients:  PinkCheek.be Fact Sheet for Healthcare Providers:  GravelBags.it This test is not yet ap proved or cleared by the [place] FDA and  has been authorized for detection and/or diagnosis of SARS-CoV-2 by FDA under an Emergency Use Authorization (EUA). This EUA will remain  in effect (meaning this test can be used) for the duration of the COVID-19 declaration  under Section 564(b)(1) of the Act, 21 U.S.C. section 360bbb-3(b)(1), unless the authorization is terminated or revoked sooner.    Influenza A by PCR NEGATIVE NEGATIVE Final   Influenza B by PCR NEGATIVE NEGATIVE Final    Comment: (NOTE) The Xpert Xpress SARS-CoV-2/FLU/RSV assay is intended as an aid in  the diagnosis of influenza from Nasopharyngeal swab specimens and  should not be used as a sole basis for treatment. Nasal washings and  aspirates are unacceptable for Xpert Xpress SARS-CoV-2/FLU/RSV  testing. Fact Sheet for Patients: PinkCheek.be Fact Sheet for Healthcare Providers: GravelBags.it This test is not yet approved or cleared by the [place] FDA and  has been authorized for detection and/or diagnosis of SARS-CoV-2 by  FDA under an Emergency Use Authorization (EUA). This EUA will remain  in effect (meaning this test can be used) for the duration of the  Covid-19 declaration under Section 564(b)(1) of the Act, 21  U.S.C. section 360bbb-3(b)(1), unless the authorization is  terminated or revoked. Performed at [facility], [place] [address], [place], [place] [address]   MRSA PCR Screening     Status: None   Collection Time: 03/13/20  4:48 AM   Specimen: Nasal Mucosa; Nasopharyngeal  Result Value Ref Range Status   MRSA by PCR NEGATIVE NEGATIVE Final    Comment:        The GeneXpert MRSA Assay (FDA approved for NASAL specimens only), is one component of a comprehensive MRSA colonization surveillance program. It is not intended to diagnose MRSA infection nor to guide or monitor treatment for MRSA infections. Performed at [facility], [place] [address], [place], [place] [address]       Studies: DG CHEST PORT 1 VIEW  Result Date: 03/13/2020 CLINICAL DATA:  New onset fever, increasing shortness of breath EXAM: PORTABLE CHEST 1 VIEW COMPARISON:  Radiograph 02/25/2020  FINDINGS: Worsening opacification of the right hemithorax with opacification with layering effusion now obscuring the minimally residual aerated lung seen in the right apex. Left pleural effusion is noted as well with some hazy interstitial and mixed patchy opacities. Cardiac silhouette is deviated leftward with an accessed left Port-A-Cath tip positioned near the superior cavoatrial junction and approximating midline. No acute osseous or soft tissue abnormality. IMPRESSION: 1. Worsening opacification of the right hemithorax with layering effusion. 2. Left pleural effusion with hazy interstitial and mixed patchy opacities. 3. Developing infection is difficult to exclude given the extensive areas obscured by overlying opacity. 4. Left Port-A-Cath tip near the superior cavoatrial junction. Electronically Signed   By: Lovena Le M.D.   On: 03/13/2020 06:10    Scheduled Meds: . acetylcysteine  2 mL Nebulization TID  . buprenorphine-naloxone  1 tablet Sublingual Daily  . Chlorhexidine Gluconate Cloth  6 each Topical Daily  . clonazepam  0.25 mg Oral BID  . docusate sodium  100 mg Oral BID  . gabapentin  300 mg Oral TID  . influenza vac split quadrivalent PF  0.5 mL Intramuscular Tomorrow-1000  . ipratropium  0.5 mg Nebulization TID  . levalbuterol  1.25 mg Nebulization TID  . mouth rinse  15 mL Mouth Rinse BID  . polyethylene glycol  17 g Oral Daily  . sodium chloride flush  10-40 mL Intracatheter Q12H  . valACYclovir  1,000 mg Oral Daily    Continuous Infusions: .  ceFAZolin (ANCEF) IV       LOS: 2 days     Kayleen Memos, MD [facility] Pager [phone]  If 7PM-7AM, please contact night-coverage [email or web address] Password [facility] 03/13/2020, 10:29 AM

## 2020-03-14 ENCOUNTER — Ambulatory Visit: Payer: Medicaid Other

## 2020-03-14 ENCOUNTER — Inpatient Hospital Stay (HOSPITAL_COMMUNITY): Payer: Medicaid Other

## 2020-03-14 ENCOUNTER — Encounter: Payer: Self-pay | Admitting: Radiation Oncology

## 2020-03-14 ENCOUNTER — Other Ambulatory Visit: Payer: Self-pay | Admitting: Oncology

## 2020-03-14 ENCOUNTER — Ambulatory Visit
Admission: RE | Admit: 2020-03-14 | Discharge: 2020-03-14 | Disposition: A | Discharge status: Home / Self Care | Payer: Medicaid Other | Source: Ambulatory Visit | Attending: Radiation Oncology | Admitting: Radiation Oncology

## 2020-03-14 DIAGNOSIS — C787 Secondary malignant neoplasm of liver and intrahepatic bile duct: Secondary | ICD-10-CM

## 2020-03-14 DIAGNOSIS — J90 Pleural effusion, not elsewhere classified: Secondary | ICD-10-CM

## 2020-03-14 DIAGNOSIS — R531 Weakness: Secondary | ICD-10-CM

## 2020-03-14 DIAGNOSIS — Z515 Encounter for palliative care: Secondary | ICD-10-CM

## 2020-03-14 DIAGNOSIS — C7801 Secondary malignant neoplasm of right lung: Principal | ICD-10-CM

## 2020-03-14 DIAGNOSIS — C50919 Malignant neoplasm of unspecified site of unspecified female breast: Secondary | ICD-10-CM

## 2020-03-14 DIAGNOSIS — I2699 Other pulmonary embolism without acute cor pulmonale: Secondary | ICD-10-CM

## 2020-03-14 DIAGNOSIS — Z9689 Presence of other specified functional implants: Secondary | ICD-10-CM

## 2020-03-14 LAB — CBC
HCT: 25.4 % — ABNORMAL LOW (ref 36.0–46.0)
Hemoglobin: 7.7 g/dL — ABNORMAL LOW (ref 12.0–15.0)
MCH: 30.3 pg (ref 26.0–34.0)
MCHC: 30.3 g/dL (ref 30.0–36.0)
MCV: 100 fL (ref 80.0–100.0)
Platelets: 40 10*3/uL — ABNORMAL LOW (ref 150–400)
RBC: 2.54 MIL/uL — ABNORMAL LOW (ref 3.87–5.11)
RDW: 19.5 % — ABNORMAL HIGH (ref 11.5–15.5)
WBC: 7.2 10*3/uL (ref 4.0–10.5)
nRBC: 1.8 % — ABNORMAL HIGH (ref 0.0–0.2)

## 2020-03-14 LAB — BASIC METABOLIC PANEL
Anion gap: 8 (ref 5–15)
BUN: 14 mg/dL (ref 6–20)
CO2: 27 mmol/L (ref 22–32)
Calcium: 8 mg/dL — ABNORMAL LOW (ref 8.9–10.3)
Chloride: 102 mmol/L (ref 98–111)
Creatinine, Ser: 0.57 mg/dL (ref 0.44–1.00)
GFR calc Af Amer: >60 mL/min (ref >60)
GFR calc non Af Amer: >60 mL/min (ref >60)
Glucose, Bld: 131 mg/dL — ABNORMAL HIGH (ref 70–99)
Potassium: 3.8 mmol/L (ref 3.5–5.1)
Sodium: 137 mmol/L (ref 135–145)

## 2020-03-14 LAB — GLUCOSE, CAPILLARY
Glucose-Capillary: 99 mg/dL (ref 70–99)
Glucose-Capillary: 91 mg/dL (ref 70–99)
Glucose-Capillary: 88 mg/dL (ref 70–99)
Glucose-Capillary: 94 mg/dL (ref 70–99)
Glucose-Capillary: 116 mg/dL — ABNORMAL HIGH (ref 70–99)

## 2020-03-14 LAB — HEPATIC FUNCTION PANEL
ALT: 36 U/L (ref 0–44)
AST: 41 U/L (ref 15–41)
Albumin: 1.9 g/dL — ABNORMAL LOW (ref 3.5–5.0)
Alkaline Phosphatase: 100 U/L (ref 38–126)
Bilirubin, Direct: 0.3 mg/dL — ABNORMAL HIGH (ref 0.0–0.2)
Indirect Bilirubin: 0.6 mg/dL (ref 0.3–0.9)
Total Bilirubin: 0.9 mg/dL (ref 0.3–1.2)
Total Protein: 5.1 g/dL — ABNORMAL LOW (ref 6.5–8.1)

## 2020-03-14 LAB — MAGNESIUM: Magnesium: 1.6 mg/dL — ABNORMAL LOW (ref 1.7–2.4)

## 2020-03-14 LAB — PHOSPHORUS: Phosphorus: 3.5 mg/dL (ref 2.5–4.6)

## 2020-03-14 MED ORDER — PRO-STAT SUGAR FREE PO LIQD
30.0000 mL | Freq: Every day | ORAL | Status: DC
  Administered 2020-03-15 – 2020-03-16 (×2): 30 mL
  Filled 2020-03-14 (×2): qty 30

## 2020-03-14 MED ORDER — VITAL AF 1.2 CAL PO LIQD
1000.0000 mL | ORAL | Status: DC
  Administered 2020-03-14 – 2020-03-16 (×3): 1000 mL

## 2020-03-14 MED ORDER — ALBUMIN HUMAN 25 % IV SOLN
12.5000 g | INTRAVENOUS | Status: AC
  Administered 2020-03-14 (×2): 12.5 g via INTRAVENOUS
  Filled 2020-03-14: qty 100

## 2020-03-14 MED ORDER — MAGNESIUM SULFATE 2 GM/50ML IV SOLN
2.0000 g | Freq: Once | INTRAVENOUS | Status: AC
  Administered 2020-03-14: 2 g via INTRAVENOUS
  Filled 2020-03-14: qty 50

## 2020-03-14 NOTE — Progress Notes (Addendum)
NAME:  Terri Estes, MRN:  [id number], DOB:  [birth year], LOS: 3 ADMISSION DATE:  03/15/2020, CONSULTATION DATE:  03/13/2020 REFERRING MD:  Dr. Irene Pap, CHIEF COMPLAINT:  DOB   Brief History   34yo female with history of metastatic breast cancer with recurrent right pleural effusion presented with dyspnea  And hypoxia at outside oncologist office.  History of present illness   Terri Estes is a 34 yo female with PMX significant for metastatic breast cancer, recurrent malignant R pleural effusion, GERD, asthma, depression/anxiety, chronic pain, and psoriasis who presented dyspneic and hypoxic at primary oncologist office at which time patient was sent to the emergency department. Of note patient was recently admitted from 03/04/2020-03/06/2020 for similar presentation were she was diagnosed with pulmonary embolism and received thoracentesis 03/05/2020 with plans to place PleurX cath if effusions continue to develop.   PCCM consulted 03/13/2020 due to progressive dyspnea. Repeat CXR 03/13/2020 with worsening opacification of the right hemithorax with layering effusion.   Past Medical History  Right breast cancer on chemo/ radiation and s/p right mastectomy and axillary lymph node dissection 10/16/2019, tobacco abuse, asthma, anemia, anxiety, depression, GERD, psoriasis, polysubstance abuse (THC and cocaine positive 12/13/2019)  Significant Hospital Events     Consults:  Oncology IR  Procedures:  Prior hospitalization Thoracentesis 03/05/2020 > 570cc bloody fluid removed   3/18 Pleurx cath place, intubation, CVC inserted, and bronchoscopy preformed   Significant Diagnostic Tests:  Prior hospitalization  CTA chest 03/04/2020 3/9 CTA PE >> Tiny left lower lobe pulmonary embolus. Increase in right-sided pleural effusion with evidence of pleural enhancement suggestive of metastatic deposits. Increase in the degree of right lung consolidation secondary to central adenopathy and likely  underlying mass. Increase in size and number of metastatic lesions within the liver as described. Increase in size of nodular changes within the left lung as well as some new ground-glass opacities. Correlation with the pending COVID-19 testing is recommended. Healing fracture of the right anterior eighth rib. Increased compression deformity at T1 likely pathologic in nature.  CXR 03/13/2020 > 1. Worsening opacification of the right hemithorax with layering effusion. 2. Left pleural effusion with hazy interstitial and mixed patchy opacities. 3. Developing infection is difficult to exclude given the extensive areas obscured by overlying opacity. 4. Left Port-A-Cath tip near the superior cavoatrial junction.  Micro Data:  COVID 3/18 > Negative  MRSA PCR 3/18 >  Negative   Antimicrobials:  Cefazolin 3/18 > Valacyclovir 3/18 >   Interim history/subjective:  Lying in bed sedated and acutely ill  Objective   Blood pressure (!) 82/56, pulse (!) 120, temperature (!) 100.9 F (38.3 C), temperature source Axillary, resp. rate 18, height 5\' 2"  (1.575 m), weight 66.3 kg, SpO2 96 %, not currently breastfeeding. CVP:  [3 mmHg-12 mmHg] 3 mmHg  Vent Mode: PCV FiO2 (%):  [40 %-100 %] 40 % Set Rate:  [28 bmp] 28 bmp PEEP:  [5 cmH20] 5 cmH20 Plateau Pressure:  [20 cmH20-31 cmH20] 28 cmH20   Intake/Output Summary (Last 24 hours) at 03/14/2020 0800 Last data filed at 03/14/2020 0650 Gross per 24 hour  Intake 2556.89 ml  Output 1400 ml  Net 1156.89 ml   Filed Weights   02/27/2020 2259 03/14/20 0454  Weight: 65.1 kg 66.3 kg    Examination: General: Chronically ill appearing adult female on mechanical ventilation, in moderate respiratory distress despite vent support  HEENT: ETT, MM pink/moist, PERRL,  Neuro: Will open eyes to verbal stimuli CV: s1s2 regular rate and rhythm,  no murmur, rubs, or gallops,  PULM:  Profound rhonchi bilaterally with increased work of breathing despite ventilator  support GI: soft, bowel sounds active in all 4 quadrants, non-tender, non-distended, tolerating TF Extremities: warm/dry, no edema  Skin: body wide psoriasis   Resolved Hospital Problem list     Assessment & Plan:  34yo female with incurable stage IV metastatic breast cancer, PCCM consulted afternoon of 3/18 due to worsening dyspnea and hypoxia. Due to decompensations patient underwent placement of Pleurx cath, intubation for bronchoscopy, and required placement of CVC for pressor support by evening of 3/18. Patient decompensation is felt to be related to pulmonary progression and oncology is recommending palliative XRT to aid in extubation and further code discussion with patient and family.   Acute hypoxic respiratory failure  -Secondary to metastatic breast cancer  P: Continue ventilator support with lung protective strategies  Wean PEEP and FiO2 for sats greater than 90%. Head of bed elevated 30 degrees. Plateau pressures less than 30 cm H20.  Follow intermittent chest x-ray and ABG.   SAT/SBT as tolerated, mentation preclude extubation  Ensure adequate pulmonary hygiene  Follow cultures  VAP bundle in place  PAD protocol  Metastatic breast cancer with malignant pleural effusion  Large left pleural effusion  History of tiny left PE  -Known history of right breast T4 N2 M1, stageIVinvasive ductal carcinoma, grade 3, triple negative, with an MIB-1 of 90% diagnoses  04/19/2019 P: PleurX cath placed 3/18, currently connected to [place] Hold anticoagulation in the setting palliative radiation and thrombocytopenia Follow chest tube output  Oncology following, recommending palliative XRT  Will need further [place] discussions Palliative care consulted   Thrombocytopenia, multifactorial  Anemia of chronic illness and malignancy  P: Monitor Plt Hold systemic anticoagulation at this time  Transfuse for Plt  < 10  Trend CBC Transfuse PRBC per protocol   Chronic  depression/anxiety Chronic pain syndrome  P: PAD protocol    Best practice:  Diet: NPO Pain/Anxiety/Delirium protocol (if indicated): PRNs VAP protocol (if indicated): N/A DVT prophylaxis: Xarelto on hold can resume later today  GI prophylaxis: PPI Glucose control: Monitor  Mobility: Bedrest  Code Status: Full Family Communication: Will update  Disposition: ICU   Labs   CBC: Recent Labs  Lab 03/18/2020 1750 03/12/20 0020 03/12/20 1032 03/13/20 0350 03/14/20 0452  WBC 8.2  --  7.9 7.9 7.2  NEUTROABS 7.4  --   --  6.9  --   HGB 7.2* 7.5* 8.6* 8.9* 7.7*  HCT 23.0* 24.2* 27.6* 28.2* 25.4*  MCV 100.4*  --  98.6 99.6 100.0  PLT 90*  --  67* 49* 40*    Basic Metabolic Panel: Recent Labs  Lab 03/14/2020 1750 03/12/20 1032 03/13/20 0350 03/13/20 1544 03/14/20 0452  NA 136 134* 136  --  137  K 4.2 3.8 4.1  --  3.8  CL 98 95* 95*  --  102  CO2 29 29 29   --  27  GLUCOSE 113* 103* 112*  --  131*  BUN 19 17 13   --  14  CREATININE 0.51 0.69 0.66  --  0.57  CALCIUM 8.8* 8.8* 8.7*  --  8.0*  MG  --   --   --  1.5* 1.6*  PHOS  --   --   --  4.7* 3.5   GFR: Estimated Creatinine Clearance: 91 mL/min (by C-G formula based on SCr of 0.57 mg/dL). Recent Labs  Lab 03/21/2020 1750 03/12/20 1032 03/13/20 0350 03/14/20 [id number]  WBC 8.2 7.9 7.9 7.2    Liver Function Tests: Recent Labs  Lab 03/12/20 1032 03/14/20 0452  AST 39 41  ALT 36 36  ALKPHOS 89 100  BILITOT 0.9 0.9  PROT 5.9* 5.1*  ALBUMIN 2.5* 1.9*   No results for input(s): LIPASE, AMYLASE in the last 168 hours. No results for input(s): AMMONIA in the last 168 hours.  ABG    Component Value Date/Time   PHART 7.486 (H) 03/13/2020 1706   PCO2ART 37.1 03/13/2020 1706   PO2ART 356 (H) 03/13/2020 1706   HCO3 27.7 03/13/2020 1706   O2SAT >100.0 03/13/2020 1706     Coagulation Profile: No results for input(s): INR, PROTIME in the last 168 hours.  Cardiac Enzymes: No results for input(s): CKTOTAL, CKMB,  CKMBINDEX, TROPONINI in the last 168 hours.  HbA1C: Hgb A1c MFr Bld  Date/Time Value Ref Range Status  12/16/2016 01:39 PM 5.3 4.8 - 5.6 % Final    Comment:             Pre-diabetes: 5.7 - 6.4          Diabetes: >6.4          Glycemic control for adults with diabetes: <7.0     CBG: Recent Labs  Lab 03/13/20 2027 03/13/20 2340 03/14/20 0349 03/14/20 0731  GLUCAP 104* 84 99 91     Critical care time:    Performed by: Johnsie Cancel  Total critical care time: 40 minutes  Critical care time was exclusive of separately billable procedures and treating other patients.  Critical care was necessary to treat or prevent imminent or life-threatening deterioration.  Critical care was time spent personally by me on the following activities: development of treatment plan with patient and/or surrogate as well as nursing, discussions with consultants, evaluation of patient's response to treatment, examination of patient, obtaining history from patient or surrogate, ordering and performing treatments and interventions, ordering and review of laboratory studies, ordering and review of radiographic studies, pulse oximetry and re-evaluation of patient's condition.  Johnsie Cancel, NP-C [facility] Contact / Pager information can be found on Amion  03/14/2020, 8:00 AM   PCCM:  34 yo FM, unfortunate situation, triple neg, stage 4 metastatic breast, AHRF, full right lung collapse from tumor compression, associated right sided MPE, s/p pleurex. Patient intubated fro ongoing failure yesterday with hopes for mucus plugging to be removed however the right lung is collapse due to tumor and effusion.   Progressive hypotension overnight. Now on vasopressors.   BP (!) 93/59   Pulse (!) 144   Temp 99.7 F (37.6 C) (Axillary)   Resp (!) 21   Ht 5\' 2"  (1.575 m)   Wt 66.3 kg   SpO2 97%   BMI 26.73 kg/m   Gen: young fm, resting on vent, appears uncomfortable, critically  ill  ENT: trachea midline, ett in place  Heart: reg rhythm, tachycardic, s1 s2 Lung: BL mechanically ventilated breaths  Abd: distended   Labs; reviewed Imaging: reviewed, small right ptx, light exvacuo following effusion removed, lung is obstructed, The patient's images have been independently reviewed by me.    A; Triple neg, Stage 4 breast Cancer Lung involvement with fully obstructed right lung  Associated right MPE s/p IPC  Multiorgan failure Acute hypoxemic respiratory failure secondary to above, on MV  Shock, multifactorial   P: Wean pressors, MAP goal >65  No concern for sinus tachy, related to pressors and she has a young heart,  just observe  I do not believe she will survive this.  This is a terminal illness and I support DNR status We will continue to work with family  Would be nice to see if radiation could help at all  If her shock state worsens she may be no longer a candidate for safe transport to radiation therapy making this entire plan a moot point.  I will reach out to palliative care team and discuss thoughts as well.   This patient is critically ill with multiple organ system failure; which, requires frequent high complexity decision making, assessment, support, evaluation, and titration of therapies. This was completed through the application of advanced monitoring technologies and extensive interpretation of multiple databases. During this encounter critical care time was devoted to patient care services described in this note for 32 minutes.   Garner Nash, DO [facility] 03/14/2020 10:43 AM

## 2020-03-14 NOTE — Consult Note (Signed)
Consultation Note Date: 03/14/2020   Patient Name: Terri Estes  DOB: 04-May-1986  MRN: [id number]  Age / Sex: 34 y.o., female  PCP: Patient, No Pcp Per Referring Physician: [name], MD  Reason for Consultation: Establishing goals of care  HPI/Patient Profile: 34 y.o. female admitted on 02/28/2020   Clinical Assessment and Goals of Care: [name] is a 34 year old lady who is under the care of Dr. Jana Estes from oncology for triple negative breast cancer.  Found to have metastatic disease at presentation involving lungs liver bones.  Status post chemotherapy and mastectomy, also has had radiation and genetic testing.  Patient admitted with recurrent right pleural effusion dyspnea and hypoxia.  Remains admitted to critical care service in ICU at [facility] in [place], [place] for acute hypoxic respiratory failure secondary to metastatic breast cancer, malignant pleural effusion, small pulmonary embolism.  Also has multifactorial thrombocytopenia, anemia of chronic illness and malignancy.  Patient has also been seen and evaluated by radiation oncology in this hospitalization.  Attempts to be made for possible palliative radiation therapy to the right hilum in an attempt to open the airways.  First fraction to be administered today 03-14-2020.  Palliative medicine consultation for continuation of goals of care discussions with patient's next of kin given patient's acute critical illness as well as serious incurable underlying malignancy.  [name] is resting in bed.  She is intubated and mechanically ventilated.  She has a diaphoretic appearance.  She does not respond or interact or follow commands.  No family present at the bedside.  Discussed with bedside nursing, discussed with PCCM colleague Dr. Valeta Harms.  Call placed and I was able to reach patient's mother Terri Estes at  [phone].  Palliative medicine is specialized medical care for people living with serious illness. It focuses on providing relief from the symptoms and stress of a serious illness. The goal is to improve quality of life for both the patient and the family.  Goals of care: Broad aims of medical therapy in relation to the patient's values and preferences. Our aim is to provide medical care aimed at enabling patients to achieve the goals that matter most to them, given the circumstances of their particular medical situation and their constraints.   Ms. Terri Estes states that she has been in touch regularly with the patient's fianc and has also called into the ICU and has been given medical updates.  She is thankful for information provided.  Although there was a lot of background noise and disturbance I was able to make out that Ms. Terri Estes is aware of how seriously ill her daughter is.  At this point in time, she is trying to get in touch with the patient's father, patient's fianc and a few other relatives to make further decisions.  I did discuss with her that most pertinent medical decision to make would be to establish DO NOT RESUSCITATE.  Although there was a lot of background noise, I did reiterate that patient likely would not benefit from  a CPR attempt.  Hoped and endorsed her wishes that radiation therapy would be possible and would work.  Offered her active listening and supportive care.  Palliative to follow.  See below.  NEXT OF KIN Parents and fianc.  SUMMARY OF RECOMMENDATIONS   Full code/full scope care for now.  Ongoing conversations with the patient's mother about next steps/goals of care.  Ongoing discussions about medical appropriateness of establishing DO NOT RESUSCITATE while the patient undergoes attempts at radiation and continuation of mechanical ventilation for now.  Palliative medicine team to continue to follow along, attempt to establish trust relationship with patient's next  of kin to help guide further decision making in this hospitalization. Thank you for the consult  Code Status/Advance Care Planning:  Full code    Symptom Management:   Continue current mode of care  Palliative Prophylaxis:   Delirium Protocol  Additional Recommendations (Limitations, Scope, Preferences):  Full Scope Treatment  Psycho-social/Spiritual:   Desire for further Chaplaincy support:yes  Additional Recommendations: Caregiving  Support/Resources  Prognosis:   Unable to determine  Discharge Planning: To Be Determined      Primary Diagnoses: Present on Admission: . Malignant pleural effusion . Acute blood loss anemia . Pulmonary embolism ([place]) . Acute respiratory failure ([place])   I have reviewed the medical record, interviewed the patient and family, and examined the patient. The following aspects are pertinent.  Past Medical History:  Diagnosis Date  . Anemia    after pregnancy  . Anxiety    severe not taking meds  . Asthma    inhaler  4x per day  . Cancer ([place])   . Chronic pain   . Depression   . Family history of breast cancer   . GERD (gastroesophageal reflux disease)   . History of substance abuse ([place])   . Pregnancy induced hypertension    takes medication now.  . Psoriasis    all over body  . Trichomonas infection   . Vaginal Pap smear, abnormal    Social History   Socioeconomic History  . Marital status: Significant Other    Spouse name: Terri Estes   . Number of children: 3  . Years of education: Not on file  . Highest education level: Not on file  Occupational History  . Not on file  Tobacco Use  . Smoking status: Current Every Day Smoker    Packs/day: 0.10    Years: 0.00    Pack years: 0.00    Types: Cigarettes  . Smokeless tobacco: Never Used  . Tobacco comment: trying to quit - smokes 2 cigarettes/day  Substance and Sexual Activity  . Alcohol use: No  . Drug use: Yes    Frequency: 7.0 times per week    Types:  Marijuana    Comment: subutex last dose 10/15/19, last dose Sun 10/14/19  . Sexual activity: Yes    Birth control/protection: None    Comment: none  Other Topics Concern  . Not on file  Social History Narrative  . Not on file   Social Determinants of Health   Financial Resource Strain:   . Difficulty of Paying Living Expenses:   Food Insecurity:   . Worried About Charity fundraiser in the Last Year:   . Arboriculturist in the Last Year:   Transportation Needs:   . Film/video editor (Medical):   Marland Kitchen Lack of Transportation (Non-Medical):   Physical Activity:   . Days of Exercise per Week:   . Minutes of  Exercise per Session:   Stress:   . Feeling of Stress :   Social Connections:   . Frequency of Communication with Friends and Family:   . Frequency of Social Gatherings with Friends and Family:   . Attends Religious Services:   . Active Member of Clubs or Organizations:   . Attends Archivist Meetings:   Marland Kitchen Marital Status:    Family History  Problem Relation Age of Onset  . Kidney disease Maternal Grandmother   . Breast cancer Maternal Grandmother 28       metastatic to liver; d. 63  . Kidney disease Maternal Grandfather   . Kidney disease Paternal Grandmother   . Diabetes Paternal Grandmother   . Breast cancer Paternal Grandmother   . Kidney disease Paternal Grandfather   . Breast cancer Paternal Aunt   . Cerebral palsy Child   . Breast cancer Paternal Great-grandmother   . Pancreatic cancer Other        PGMs brother   . Throat cancer Paternal Uncle   . Breast cancer Other        PGMs sister   Scheduled Meds: . buprenorphine-naloxone  1 tablet Sublingual Daily  . chlorhexidine gluconate (MEDLINE KIT)  15 mL Mouth Rinse BID  . Chlorhexidine Gluconate Cloth  6 each Topical Daily  . clonazepam  0.25 mg Oral BID  . docusate  100 mg Per Tube BID  . [START ON 03/15/2020] feeding supplement (PRO-STAT SUGAR FREE 64)  30 mL Per Tube Daily  . gabapentin  100  mg Per Tube Q12H  . guaiFENesin  1,200 mg Oral BID  . influenza vac split quadrivalent PF  0.5 mL Intramuscular Tomorrow-1000  . mouth rinse  15 mL Mouth Rinse 10 times per day  . polyethylene glycol  17 g Oral Daily  . sodium chloride flush  10-40 mL Intracatheter Q12H  . valACYclovir  1,000 mg Oral Daily   Continuous Infusions: . sodium chloride    .  ceFAZolin (ANCEF) IV    . dexmedetomidine (PRECEDEX) IV infusion 0.9 mcg/kg/hr (03/14/20 1150)  . feeding supplement (VITAL AF 1.2 CAL)    . fentaNYL infusion INTRAVENOUS 200 mcg/hr (03/14/20 1017)  . norepinephrine (LEVOPHED) Adult infusion 7 mcg/min (03/14/20 1235)  . phenylephrine (NEO-SYNEPHRINE) Adult infusion Stopped (03/14/20 0941)  . vasopressin (PITRESSIN) infusion - *FOR SHOCK* 0.04 Units/min (03/14/20 1017)   PRN Meds:.Place/Maintain arterial line **AND** sodium chloride, acetaminophen (TYLENOL) oral liquid 160 mg/5 mL, fentaNYL (SUBLIMAZE) injection, guaiFENesin-dextromethorphan, HYDROmorphone (DILAUDID) injection, levalbuterol, ondansetron **OR** ondansetron (ZOFRAN) IV, oxyCODONE, sodium chloride flush Medications Prior to Admission:  Prior to Admission medications   Medication Sig Start Date End Date Taking? Authorizing Provider  albuterol (VENTOLIN HFA) 108 (90 Base) MCG/ACT inhaler Inhale 2 puffs into the lungs every 6 (six) hours as needed for wheezing or shortness of breath. 02/15/20  Yes Magrinat, [name], MD  benzonatate (TESSALON) 100 MG capsule Take 1 capsule (100 mg total) by mouth 3 (three) times daily as needed for cough. 03/06/20  Yes Rai, Ripudeep K, MD  buprenorphine (SUBUTEX) 8 MG SUBL SL tablet Place 8 mg under the tongue 2 (two) times daily.    Yes [provider]  capecitabine (XELODA) 500 MG tablet Take 2 tablets (1,000 mg total) by mouth 2 (two) times daily after a meal. Take Monday-Friday. Take only on days of radiation. 02/08/20  Yes Magrinat, [name], MD  cholestyramine (QUESTRAN) 4 g packet Take  1 packet (4 g total) by mouth 3 (three)  times daily with meals. Patient taking differently: Take 4 g by mouth 3 (three) times daily with meals as needed (diarrhea).  05/11/19  Yes Magrinat, [name], MD  clonazePAM (KLONOPIN) 1 MG tablet Take 1 tablet (1 mg total) by mouth at bedtime as needed for anxiety. 03/06/20 03/06/21 Yes Rai, Ripudeep K, MD  cyclobenzaprine (FLEXERIL) 10 MG tablet Take 1 tablet (10 mg total) by mouth 3 (three) times daily as needed for muscle spasms. 09/26/18  Yes Donnamae Jude, MD  dexamethasone (DECADRON) 4 MG tablet Take 1 tablet (4 mg total) by mouth daily with breakfast. 02/15/20  Yes Magrinat, [name], MD  Famotidine (PEPCID PO) Take 1 tablet by mouth daily.   Yes [provider]  gabapentin (NEURONTIN) 300 MG capsule TAKE 1 CAPSULE(300 MG) BY MOUTH THREE TIMES DAILY Patient taking differently: Take 300 mg by mouth 3 (three) times daily.  09/10/19  Yes Causey, Charlestine Massed, NP  guaiFENesin 200 MG tablet Take 200 mg by mouth every 4 (four) hours as needed for cough or to loosen phlegm.   Yes [provider]  HYDROcodone-homatropine (HYCODAN) 5-1.5 MG/5ML syrup Take 5 mLs by mouth every 6 (six) hours as needed for cough. 03/06/20  Yes Rai, Ripudeep K, MD  metoCLOPramide (REGLAN) 10 MG tablet Take 1 tablet (10 mg total) by mouth every 8 (eight) hours as needed for nausea. 11/29/19  Yes Caccavale, Sophia, PA-C  ondansetron (ZOFRAN) 8 MG tablet Take 1 tablet (8 mg total) by mouth every 8 (eight) hours as needed for nausea or vomiting. 11/21/19  Yes Magrinat, [name], MD  oxyCODONE (OXY IR/ROXICODONE) 5 MG immediate release tablet Take 1 tablet (5 mg total) by mouth every 6 (six) hours as needed for severe pain. 02/29/20  Yes Kyung Rudd, MD  Rivaroxaban 15 & 20 MG TBPK Follow package directions: Take one 62m tablet by mouth twice a day. On day 22 (03/26/2020), switch to one 262mtablet once a day. Take with food. 03/06/20  Yes Rai, Ripudeep K, MD  valACYclovir  (VALTREX) 1000 MG tablet Take 1 tablet (1,000 mg total) by mouth 2 (two) times daily. Patient taking differently: Take 1,000 mg by mouth 2 (two) times daily as needed (viral outbreak).  06/26/19  Yes Causey, LiCharlestine MassedNP  albuterol (VENTOLIN HFA) 108 (90 Base) MCG/ACT inhaler Inhale 2 puffs into the lungs 4 (four) times daily. Patient not taking: Reported on 03/01/2020 02/15/20   Magrinat, GuVirgie DadMD  rivaroxaban (XARELTO) 20 MG TABS tablet Take 1 tablet (20 mg total) by mouth daily with supper. Please start after the starter pack has been completed 03/26/20   Rai, Ripudeep K, MD  enalapril (VASOTEC) 10 MG tablet TAKE 1 TABLET BY MOUTH DAILY Patient taking differently: Take 10 mg by mouth daily.  03/15/19   PrDonnamae JudeMD   No Known Allergies Review of Systems Non verbal, on the vent  Physical Exam Young female resting in bed intubated mechanically ventilated S1-S2 tachycardic Abdomen is distended Patient has diffuse skin changes/lacy rash both lower extremities Appears diaphoretic Opens eyes when name is called loudly  Vital Signs: BP 107/70 (BP Location: Left Arm)   Pulse (!) 137   Temp 97.8 F (36.6 C)   Resp (!) 21   Ht _0  (1.575 m)   Wt 66.3 kg   SpO2 97%   BMI 26.73 kg/m  Pain Scale: CPOT POSS *See Group Information*: 1-Acceptable,Awake and alert Pain Score: Asleep   SpO2: SpO2: 97 % O2  Device:SpO2: 97 % O2 Flow Rate: .O2 Flow Rate (L/min): 3 L/min  IO: Intake/output summary:   Intake/Output Summary (Last 24 hours) at 03/14/2020 1412 Last data filed at 03/14/2020 1200 Gross per 24 hour  Intake 3391.13 ml  Output 1650 ml  Net 1741.13 ml    LBM: Last BM Date: (unknown per patient) Baseline Weight: Weight: 65.1 kg Most recent weight: Weight: 66.3 kg     Palliative Assessment/Data:   PPS 10%  Time In:  12 Time Out:  1300 Time Total:   60  Greater than 50%  of this time was spent counseling and coordinating care related to the above assessment  and plan.  Signed by: Loistine Chance, MD   Please contact Palliative Medicine Team phone at [phone] for questions and concerns.  For individual provider: See Shea Evans

## 2020-03-14 NOTE — Progress Notes (Signed)
[facility] ADULT ICU REPLACEMENT PROTOCOL FOR AM LAB REPLACEMENT ONLY  The patient does apply for the [facility] Adult ICU Electrolyte Replacment Protocol based on the criteria listed below:   1. Is GFR >/= 40 ml/min? Yes.    Patient's GFR today is >60 2. Is urine output >/= 0.5 ml/kg/hr for the last 6 hours? Yes.   Patient's UOP is 1.69 ml/kg/hr 3. Is BUN < 60 mg/dL? Yes.    Patient's BUN today is 14 4. Abnormal electrolyte  Mg 1.6 5. Ordered repletion with: per protocol 6. If a panic level lab has been reported, has the CCM MD in charge been notified? Yes.  .   Physician:  Mallie Snooks 03/14/2020 6:30 AM

## 2020-03-14 NOTE — Consult Note (Signed)
Received call about patient for possible XRT. Bronchoscopy report reviewed.  She has been undergoing XRT for right-sided breast cancer, and now has pulmonary progression within the right lung with worsening respiratory function. Patient is a candidate for palliative XRT to the right hilum to attempt to open the airways in this area. Patient will undergo simulation for this treatment this am and will begin her first fraction later today.  ------------------------------------------------  Jodelle Gross, MD, PhD

## 2020-03-14 NOTE — Progress Notes (Signed)
See my note earlier today for fuller discussion.  I contacted patient's significant other, [name], who was able to give me information on contacting the patient's parents:  Analisa Rasnic Pierre  [phone]    [name] is currently in XRT.  Greatly appreciate Palliative Care's continuing discussion with patient's family re goals of care and clarifying a DNR decision.  I will be back Monday. Please consult my partners as needed.

## 2020-03-14 NOTE — Progress Notes (Signed)
Patient underwent simulation today and received 1 fraction of XRT. Given the patient's prior XRT to the right chest wall/ SCLV region, a plan was developed to avoid this prior XRT area and minimize dose to the lung to the necessary extent. This resulted in a plan for a single fraction of 8 Gy. This is a strong single dose of XRT that will hopefully shrink the tumor in the target right hilum region. No further XRT is planned, as this was determined a longer course of XRT would yield an unacceptable dose to the remaining lung.   Treatment went well late this afternoon without any difficulties. I spoke with her contact [name] prior to the treatment during the planning process. I tried to reach him after the treatment to let him know this was completed, but I was unable to reach him or leave a voicemail. During my initial conversation with him, we discussed that the patient's status is serious and it is not clear what her outcome will be. My hope is that shrinkage of the tumor will help lead to improve aeration of the right lung, and the next several days will be instructive.  ------------------------------------------------  Jodelle Gross, MD, PhD

## 2020-03-14 NOTE — Progress Notes (Signed)
PCCM progress note   Notified by nursing that patients mother and father were at bedside and were requesting an  update. Discussion was held at bedside regarding patients incurable metastatic disease. Family voiced understanding and requested time to consider all options prior to final decision being made.   Johnsie Cancel, NP-C [facility] Contact / Pager information can be found on Amion  03/14/2020, 6:56 PM

## 2020-03-14 NOTE — Progress Notes (Signed)
Terri Estes   DOB:August 06, 1986   ZO#:[id number]   [id number]#:[id number]  Subjective:  [name] remains intubated; opens eyes to voice and touch; no family in room   Objective: young African American woman examined in bed Vitals:   03/14/20 0800 03/14/20 0811  BP: 99/72   Pulse: (!) 111   Resp: 20   Temp:  99.7 F (37.6 C)  SpO2: 96%     Body mass index is 26.73 kg/m.  Intake/Output Summary (Last 24 hours) at 03/14/2020 0827 Last data filed at 03/14/2020 0650 Gross per 24 hour  Intake 2556.89 ml  Output 1400 ml  Net 1156.89 ml    CBG (last 3)  Recent Labs    03/13/20 2340 03/14/20 0349 03/14/20 0731  GLUCAP 84 99 91     Labs:  Lab Results  Component Value Date   WBC 7.2 03/14/2020   HGB 7.7 (L) 03/14/2020   HCT 25.4 (L) 03/14/2020   MCV 100.0 03/14/2020   PLT 40 (L) 03/14/2020   NEUTROABS 6.9 03/13/2020    _0 @  Urine Studies No results for input(s): UHGB, CRYS in the last 72 hours.  Invalid input(s): UACOL, UAPR, USPG, UPH, UTP, UGL, [place], UBIL, UNIT, UROB, [place], UEPI, UWBC, [place], [place], [place], [place], [place]  Basic Metabolic Panel: Recent Labs  Lab 02/29/2020 1750 03/17/2020 1750 03/12/20 1032 03/12/20 1032 03/13/20 0350 03/13/20 1544 03/14/20 0452  NA 136  --  134*  --  136  --  137  K 4.2   < > 3.8   < > 4.1  --  3.8  CL 98  --  95*  --  95*  --  102  CO2 29  --  29  --  29  --  27  GLUCOSE 113*  --  103*  --  112*  --  131*  BUN 19  --  17  --  13  --  14  CREATININE 0.51  --  0.69  --  0.66  --  0.57  CALCIUM 8.8*  --  8.8*  --  8.7*  --  8.0*  MG  --   --   --   --   --  1.5* 1.6*  PHOS  --   --   --   --   --  4.7* 3.5   < > = values in this interval not displayed.   GFR Estimated Creatinine Clearance: 91 mL/min (by C-G formula based on SCr of 0.57 mg/dL). Liver Function Tests: Recent Labs  Lab 03/12/20 1032 03/14/20 0452  AST 39 41  ALT 36 36  ALKPHOS 89 100  BILITOT 0.9 0.9  PROT 5.9* 5.1*  ALBUMIN 2.5* 1.9*   No results  for input(s): LIPASE, AMYLASE in the last 168 hours. No results for input(s): AMMONIA in the last 168 hours. Coagulation profile No results for input(s): INR, PROTIME in the last 168 hours.  CBC: Recent Labs  Lab 02/26/2020 1750 03/12/20 0020 03/12/20 1032 03/13/20 0350 03/14/20 0452  WBC 8.2  --  7.9 7.9 7.2  NEUTROABS 7.4  --   --  6.9  --   HGB 7.2* 7.5* 8.6* 8.9* 7.7*  HCT 23.0* 24.2* 27.6* 28.2* 25.4*  MCV 100.4*  --  98.6 99.6 100.0  PLT 90*  --  67* 49* 40*   Cardiac Enzymes: No results for input(s): CKTOTAL, CKMB, CKMBINDEX, TROPONINI in the last 168 hours. BNP: Invalid input(s): POCBNP CBG: Recent Labs  Lab 03/13/20 2027 03/13/20 2340 03/14/20 0349  03/14/20 0731  GLUCAP 104* 84 99 91   D-Dimer No results for input(s): DDIMER in the last 72 hours. Hgb A1c No results for input(s): HGBA1C in the last 72 hours. Lipid Profile No results for input(s): CHOL, HDL, LDLCALC, TRIG, CHOLHDL, LDLDIRECT in the last 72 hours. Thyroid function studies No results for input(s): TSH, T4TOTAL, T3FREE, THYROIDAB in the last 72 hours.  Invalid input(s): FREET3 Anemia work up No results for input(s): VITAMINB12, FOLATE, FERRITIN, TIBC, IRON, RETICCTPCT in the last 72 hours. Microbiology Recent Results (from the past 240 hour(s))  Respiratory Panel by RT PCR (Flu A&B, Covid) - Nasopharyngeal Swab     Status: None   Collection Time: 03/04/20  4:20 PM   Specimen: Nasopharyngeal Swab  Result Value Ref Range Status   SARS Coronavirus 2 by RT PCR NEGATIVE NEGATIVE Final    Comment: (NOTE) SARS-CoV-2 target nucleic acids are NOT DETECTED. The SARS-CoV-2 RNA is generally detectable in upper respiratoy specimens during the acute phase of infection. The lowest concentration of SARS-CoV-2 viral copies this assay can detect is 131 copies/mL. A negative result does not preclude SARS-Cov-2 infection and should not be used as the sole basis for treatment or other patient management  decisions. A negative result may occur with  improper specimen collection/handling, submission of specimen other than nasopharyngeal swab, presence of viral mutation(s) within the areas targeted by this assay, and inadequate number of viral copies (<131 copies/mL). A negative result must be combined with clinical observations, patient history, and epidemiological information. The expected result is Negative. Fact Sheet for Patients:  PinkCheek.be Fact Sheet for Healthcare Providers:  GravelBags.it This test is not yet ap proved or cleared by the [place] FDA and  has been authorized for detection and/or diagnosis of SARS-CoV-2 by FDA under an Emergency Use Authorization (EUA). This EUA will remain  in effect (meaning this test can be used) for the duration of the COVID-19 declaration under Section 564(b)(1) of the Act, 21 U.S.C. section 360bbb-3(b)(1), unless the authorization is terminated or revoked sooner.    Influenza A by PCR NEGATIVE NEGATIVE Final   Influenza B by PCR NEGATIVE NEGATIVE Final    Comment: (NOTE) The Xpert Xpress SARS-CoV-2/FLU/RSV assay is intended as an aid in  the diagnosis of influenza from Nasopharyngeal swab specimens and  should not be used as a sole basis for treatment. Nasal washings and  aspirates are unacceptable for Xpert Xpress SARS-CoV-2/FLU/RSV  testing. Fact Sheet for Patients: PinkCheek.be Fact Sheet for Healthcare Providers: GravelBags.it This test is not yet approved or cleared by the [place] FDA and  has been authorized for detection and/or diagnosis of SARS-CoV-2 by  FDA under an Emergency Use Authorization (EUA). This EUA will remain  in effect (meaning this test can be used) for the duration of the  Covid-19 declaration under Section 564(b)(1) of the Act, 21  U.S.C. section 360bbb-3(b)(1), unless the authorization  is  terminated or revoked. Performed at [facility], [place] [address], [place], [place] [address]   Culture, body fluid-bottle     Status: None   Collection Time: 03/05/20 12:23 PM   Specimen: Fluid  Result Value Ref Range Status   Specimen Description FLUID PLEURAL  Final   Special Requests   Final    BOTTLES DRAWN AEROBIC AND ANAEROBIC 10CC Performed at [facility], [address], [place], [place] [address]    Culture NO GROWTH 5 DAYS  Final   Report Status 03/10/2020 FINAL  Final  Gram stain     Status: None   Collection Time: 03/05/20 12:23 PM   Specimen: Fluid  Result Value Ref Range Status   Specimen Description FLUID PLEURAL  Final   Special Requests NONE  Final   Gram Stain   Final    RARE WBC PRESENT,BOTH PMN AND MONONUCLEAR NO ORGANISMS SEEN Performed at [facility], [address], [place], [place] [address]    Report Status 03/05/2020 FINAL  Final  Respiratory Panel by RT PCR (Flu A&B, Covid) - Nasopharyngeal Swab     Status: None   Collection Time: 02/25/2020  7:00 PM   Specimen: Nasopharyngeal Swab  Result Value Ref Range Status   SARS Coronavirus 2 by RT PCR NEGATIVE NEGATIVE Final    Comment: (NOTE) SARS-CoV-2 target nucleic acids are NOT DETECTED. The SARS-CoV-2 RNA is generally detectable in upper respiratoy specimens during the acute phase of infection. The lowest concentration of SARS-CoV-2 viral copies this assay can detect is 131 copies/mL. A negative result does not preclude SARS-Cov-2 infection and should not be used as the sole basis for treatment or other patient management decisions. A negative result may occur with  improper specimen collection/handling, submission of specimen other than nasopharyngeal swab, presence of viral mutation(s) within the areas targeted by this assay, and inadequate number of viral copies (<131 copies/mL). A negative result must be combined with clinical observations, patient history,  and epidemiological information. The expected result is Negative. Fact Sheet for Patients:  PinkCheek.be Fact Sheet for Healthcare Providers:  GravelBags.it This test is not yet ap proved or cleared by the [place] FDA and  has been authorized for detection and/or diagnosis of SARS-CoV-2 by FDA under an Emergency Use Authorization (EUA). This EUA will remain  in effect (meaning this test can be used) for the duration of the COVID-19 declaration under Section 564(b)(1) of the Act, 21 U.S.C. section 360bbb-3(b)(1), unless the authorization is terminated or revoked sooner.    Influenza A by PCR NEGATIVE NEGATIVE Final   Influenza B by PCR NEGATIVE NEGATIVE Final    Comment: (NOTE) The Xpert Xpress SARS-CoV-2/FLU/RSV assay is intended as an aid in  the diagnosis of influenza from Nasopharyngeal swab specimens and  should not be used as a sole basis for treatment. Nasal washings and  aspirates are unacceptable for Xpert Xpress SARS-CoV-2/FLU/RSV  testing. Fact Sheet for Patients: PinkCheek.be Fact Sheet for Healthcare Providers: GravelBags.it This test is not yet approved or cleared by the [place] FDA and  has been authorized for detection and/or diagnosis of SARS-CoV-2 by  FDA under an Emergency Use Authorization (EUA). This EUA will remain  in effect (meaning this test can be used) for the duration of the  Covid-19 declaration under Section 564(b)(1) of the Act, 21  U.S.C. section 360bbb-3(b)(1), unless the authorization is  terminated or revoked. Performed at [facility], [place] [address] [place], [place] [address]   MRSA PCR Screening     Status: None   Collection Time: 03/13/20  4:48 AM   Specimen: Nasal Mucosa; Nasopharyngeal  Result Value Ref Range Status   MRSA by PCR NEGATIVE NEGATIVE Final    Comment:        The GeneXpert MRSA  Assay (FDA approved for NASAL specimens only), is one component of a comprehensive MRSA colonization surveillance program. It is not intended to diagnose MRSA infection nor to guide or monitor treatment for MRSA infections. Performed at [facility], [place] [address] [place],  [place] [address]   Culture, respiratory     Status: None (Preliminary result)   Collection Time: 03/13/20  2:19 PM   Specimen: Bronchoalveolar Lavage; Respiratory  Result Value Ref Range Status   Specimen Description   Final    BRONCHIAL ALVEOLAR LAVAGE Performed at [place] [address], [place], [place] [address]    Special Requests   Final    NONE Performed at [facility], [place] [address], [place], [place] [address]    Gram Stain   Final    FEW WBC PRESENT, PREDOMINANTLY PMN RARE YEAST Performed at [facility], [place] [address], [place], [place] [address]    Culture PENDING  Incomplete   Report Status PENDING  Incomplete      Studies:  DG Chest 1 View  Result Date: 03/13/2020 CLINICAL DATA:  Hypoxia EXAM: CHEST  1 VIEW COMPARISON:  March 13, 2020 study obtained earlier in the day FINDINGS: Endotracheal tube tip is 4.0 cm above the carina. Nasogastric tube tip and side port are below the diaphragm with the side port seen in the stomach. Port-A-Cath tip is in the superior vena cava. Right jugular catheter tip is in the superior vena cava. A chest tube is present on the right. There is a small lateral pneumothorax on the right without tension component. There is complete opacification of the right hemithorax. There is a small left pleural effusion. Left lung otherwise clear. Heart size normal. No adenopathy. Surgical clips noted in right axilla. IMPRESSION: Tube and catheter positions as described. Small lateral pneumothorax on the right without tension component. Diffuse opacification of the right hemithorax is likely due to pleural  effusion. There may be a degree of underlying atelectasis/consolidation. Left lung is clear except for small left pleural effusion. Electronically Signed   By: Lowella Grip III M.D.   On: 03/13/2020 16:02   DG CHEST PORT 1 VIEW  Result Date: 03/13/2020 CLINICAL DATA:  New onset fever, increasing shortness of breath EXAM: PORTABLE CHEST 1 VIEW COMPARISON:  Radiograph 03/26/2020 FINDINGS: Worsening opacification of the right hemithorax with opacification with layering effusion now obscuring the minimally residual aerated lung seen in the right apex. Left pleural effusion is noted as well with some hazy interstitial and mixed patchy opacities. Cardiac silhouette is deviated leftward with an accessed left Port-A-Cath tip positioned near the superior cavoatrial junction and approximating midline. No acute osseous or soft tissue abnormality. IMPRESSION: 1. Worsening opacification of the right hemithorax with layering effusion. 2. Left pleural effusion with hazy interstitial and mixed patchy opacities. 3. Developing infection is difficult to exclude given the extensive areas obscured by overlying opacity. 4. Left Port-A-Cath tip near the superior cavoatrial junction. Electronically Signed   By: Lovena Le M.D.   On: 03/13/2020 06:10    Assessment:33 y.o.[place]. [place] woman status post right breast biopsy x2 and right axillary lymph node biopsy 04/19/2019 for a clinical T4 N2 M1, stageIVinvasive ductal carcinoma, grade 3, triple negative, with an MIB-1 of 90% (a) chest CT scan 04/11/2019 shows multiple subcentimeter lung nodules (b) PET scan denied by insurance (c) CT A/P and bone scan due 05/09/2019 show a very small low-density lesion in the lateral segment of the left liver which is indeterminate, and sclerosis of the right SI joint, which is felt to be likely degenerative; bone scan was entirely negative. (c) baseline breast MRI 04/30/2019 shows  involvement of all 4 quadrants in the right breast as well as right axillary subpectoral and  internal mammary lymphadenopathy. 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, liver, bones (1) neoadjuvant chemotherapy consisting of (a) pembrolizumabstarted 05/03/2019, third dose delayed because of elevated liver function tests, resumed 08/02/2019 (b) paclitaxelweekly x12 with carboplatinevery 21 days x 4, started 05/08/2019 (i) tolerated carboplatin AUC 5-day 1 poorly, switched to AUC 2 on day 1 day 8 (ii) cycle 2 delayed 1 week because of elevated liver function tests (iii) carboplatin and paclitaxel discontinuedafter 07/03/2019 because of neuropathy, (received 7 weekly doses paclitaxel and one AUC5 carbo dose, four AUC2 doses) (c) cyclophosphamideand doxorubicin x4 started 07/03/2019, completed 08/29/2019 (i) 2d dose given day 28 due to poor tolerance; other doses Q 21 days  (2) s/p right mastectomy and ALND 10/16/2019 showing a residual ypT2 ypN2invasive ductal carcinoma, grade 3, with negative margins (a) a total of 10 lymph nodes were removed, 4 with macrometastases, 1 with a micrometastasis  (3) adjuvant post-mastectomy radiationongoing  (4) genetics testing (a) genetic testing 05/14/2019 through the Common Hereditary Gene Panel offered by [facility] found no deleterious mutationsin APC, ATM, AXIN2, BARD1, BMPR1A, BRCA1, BRCA2, BRIP1, CDH1, CDK4, CDKN2A (p14ARF), CDKN2A (p16INK4a), CHEK2, CTNNA1, DICER1, EPCAM (Deletion/duplication testing only), GREM1 (promoter region deletion/duplication testing only), KIT, MEN1, MLH1, MSH2, MSH3, MSH6, MUTYH, NBN, NF1, NHTL1, PALB2, PDGFRA, PMS2, POLD1, POLE, PTEN, RAD50, RAD51C, RAD51D, RNF43, SDHB, SDHC, SDHD, SMAD4, SMARCA4. STK11,  TP53, TSC1, TSC2, and VHL. The following genes were evaluated for sequence changes only: SDHA and HOXB13 c.251G>A variant only  (5) goserelinstarted 05/08/2019-- most recent dose 02/21/2020  (6) pembrolizumabstarted 08/02/2019, discontinued after fourth dose (10/02/2019) with psoriasis flare  (7) psoriasis: (a) weekly methotrexate started 11/10/2019, held after 11/17/2019 dose so as not to overlap with radiation (b) intravenous cyclophosphamide 1 g every 3 weeks starting 11/21/2019, last dose 01/24/2020  (8)RESTAGING STUDIES: (a) CT chest 01/31/2020 showsprogression in herlung and liver lesions, confirming that findings on initial scans April 2020 represented metastatic disease;right pleural effusion, mediastinal adenopathy, T1 wedge deformity (b) brain MRIpending (c)right thoracentesis 02/07/2020 cytologically positive for triple negative GATA-3 positive cells (d) repeat thoracentesis 03/05/2020 and 03/13/2020 (fluid not bloody)  (e) bronchoscopy 03/13/2020 shows extrinsic compression RUL and bronchus intermedius at secondary carina.  Segmental airways open but nothing open beyond that.   (9) startedcapecitabinewith palliative radiation to chest wall02/17/2021-- on hold  (10) consider sacituzumab govitecan once radiation completed (vs. best supportive care)  (11) left lower lobe distal pulmonary embolus per CT/angio 03/04/2020  (a) on rivaroxaban-- currently on hold  (12) cytopenias: likely multifatorial including likely breast cancer infiltration of marrow  (13) palliative radiation to R hilum: ongoing   Plan: Abriella's situation is difficult especially as I have no HCPOA on file. However at her 02/04/2020 visit she told me she intended to name her parents as HCPOA. While we continue to maximally support, I think resuscitation in case of a terminal event is likely to be futile and I  would support a DNR order, even as we continue radiation, ICU and other support, and later possible systemic therapy (chemo-immunotherapy) depending on her degree of recovery. I have requested a palliative care consult to move this forward and discuss goals of care with family.  Her thrombocytopenia is likely multifactorial--I agree with holding anticoagulants as her PE was minimal; can resume at your discretion once platelets >50K. No need to tranfuse unless active bleeding or <10K.  We will follow with you.    Chauncey Cruel, MD 03/14/2020  8:27 AM Medical Oncology and Hematology [place]  [place], [place] [address] Tel. [phone]    Fax. [phone]

## 2020-03-14 NOTE — Progress Notes (Addendum)
Initial Nutrition Assessment  DOCUMENTATION CODES:   Not applicable  INTERVENTION:  - will adjust TF regimen: Vital AF 1.2 @ 55 ml/hr with 30 ml prostat once/day. - this regimen will provide 1684 kcal, 114 grams protein, and 1070 ml free water. - free water flush, if desired, to be per MD/NP given pleural effusion.    NUTRITION DIAGNOSIS:   Inadequate oral intake related to inability to eat as evidenced by NPO status.  GOAL:   Patient will meet greater than or equal to 90% of their needs  MONITOR:   Vent status, TF tolerance, Labs, Weight trends  REASON FOR ASSESSMENT:   Ventilator  ASSESSMENT:   34 y.o. female with medical history of metastatic breast cancer, recurrent malignant pleural effusion, GERD, asthma, depression, chronic pain, and psoriasis. She was admitted 3/9-3/11 due to SOB and was found to have pulmonary embolism with large R pleural effusion s/p thoracentesis on 3/10. She went for Oncology follow-up on 3/16 and was found to have increased SOB. Repeat imaging showed recurrence of pleural effusion. She denied N/V but reported feeling generally unwell and all over weakness.  Patient was intubated yesterday at ~1420 and OGT placed around that time. She is receiving TF per protocol: Vital High Protein @ 40 ml/hr with 30 ml prostat BID. This regimen is providing 1160 kcal, 114 grams protein, and 802 ml free water. Will adjust regimen as outlined above.  No family or visitors present at this time. Per chart review, weight today is 146 lb, weight on 2/8 was 157 lb, and weight on 1/28 was 159 lb. This indicates 11 lb weight loss (7% body weight) in ~1.5 months and 13 lb weight loss (8.2% body weight) in 2 months; significant for time frame but patient has also had fluid removed d/t pleural effusion.   Patient to go to radiation simulation today and then have first radiation today. Pleurx was placed yesterday (3/18).   Per notes: - mentation does not currently allow for  extubation - metastatic breast cancer with malignant pleural effusion, large L pleural effusion - Palliative consult for GOC discussion   Patient is currently intubated on ventilator support MV: 6.8 L/min Temp (24hrs), Avg:99.4 F (37.4 C), Min:98 F (36.7 C), Max:100.9 F (38.3 C) Propofol: none BP: 105/49 and MAP: 66  Labs reviewed; CBGs: 99 and 91 mg/dl, Ca: 8 mg/dl, Mg: 1.6 mg/dl. Medications reviewed; 100 mg colace BID, 2 g IV Mg sulfate x1 dose 3/19, 1 packet miralax/day. Drips; levo @ 8 mcg/min, fentanyl @ 200 mcg/hr, precedex @ 0.9 mcg/kg/hr, vaso @ 0.04 units/min.    NUTRITION - FOCUSED PHYSICAL EXAM:  completed; no muscle or fat wasting, no edema at this time.   Diet Order:   Diet Order            Diet NPO time specified Except for: Sips with Meds  Diet effective midnight              EDUCATION NEEDS:   No education needs have been identified at this time  Skin:  Skin Assessment: Reviewed RN Assessment  Last BM:  PTA/unknown  Height:   Ht Readings from Last 1 Encounters:  03/17/2020 5\' 2"  (1.575 m)    Weight:   Wt Readings from Last 1 Encounters:  03/14/20 66.3 kg    Ideal Body Weight:  50 kg  BMI:  Body mass index is 26.73 kg/m.  Estimated Nutritional Needs:   Kcal:  1675 kcal  Protein:  100-113 grams  Fluid:  >/=  1.5 L/day     Jarome Matin, MS, RD, LDN, CNSC Inpatient Clinical Dietitian RD pager # available in [facility]  After hours/weekend pager # available in [facility]

## 2020-03-14 NOTE — Procedures (Signed)
Arterial Catheter Insertion Procedure Note Terri Estes [id number] 02/18/1986  Procedure: Insertion of Arterial Catheter  Indications: Blood pressure monitoring and Frequent blood sampling  Procedure Details Consent: Risks of procedure as well as the alternatives and risks of each were explained to the (patient/caregiver).  Consent for procedure obtained. Time Out: Verified patient identification, verified procedure, site/side was marked, verified correct patient position, special equipment/implants available, medications/allergies/relevent history reviewed, required imaging and test results available.  Performed  Maximum sterile technique was used including antiseptics, cap, gloves, gown, hand hygiene, mask and sheet. Skin prep: Chlorhexidine; local anesthetic administered 20 gauge catheter was inserted into left radial artery using the Seldinger technique. ULTRASOUND GUIDANCE USED: NO Evaluation Blood flow good; BP tracing good. Complications: No apparent complications.   Irven Shelling 03/14/2020

## 2020-03-15 DIAGNOSIS — J9601 Acute respiratory failure with hypoxia: Secondary | ICD-10-CM

## 2020-03-15 DIAGNOSIS — D62 Acute posthemorrhagic anemia: Secondary | ICD-10-CM

## 2020-03-15 LAB — GLUCOSE, CAPILLARY
Glucose-Capillary: 105 mg/dL — ABNORMAL HIGH (ref 70–99)
Glucose-Capillary: 114 mg/dL — ABNORMAL HIGH (ref 70–99)
Glucose-Capillary: 156 mg/dL — ABNORMAL HIGH (ref 70–99)
Glucose-Capillary: 92 mg/dL (ref 70–99)
Glucose-Capillary: 95 mg/dL (ref 70–99)
Glucose-Capillary: 96 mg/dL (ref 70–99)

## 2020-03-15 LAB — CBC
HCT: 21.8 % — ABNORMAL LOW (ref 36.0–46.0)
Hemoglobin: 6.6 g/dL — CL (ref 12.0–15.0)
MCH: 31.4 pg (ref 26.0–34.0)
MCHC: 30.3 g/dL (ref 30.0–36.0)
MCV: 103.8 fL — ABNORMAL HIGH (ref 80.0–100.0)
Platelets: 28 10*3/uL — CL (ref 150–400)
RBC: 2.1 MIL/uL — ABNORMAL LOW (ref 3.87–5.11)
RDW: 19.2 % — ABNORMAL HIGH (ref 11.5–15.5)
WBC: 5.4 10*3/uL (ref 4.0–10.5)
nRBC: 1.3 % — ABNORMAL HIGH (ref 0.0–0.2)

## 2020-03-15 LAB — PREPARE RBC (CROSSMATCH)

## 2020-03-15 LAB — BASIC METABOLIC PANEL
Anion gap: 9 (ref 5–15)
BUN: 12 mg/dL (ref 6–20)
CO2: 26 mmol/L (ref 22–32)
Calcium: 8.3 mg/dL — ABNORMAL LOW (ref 8.9–10.3)
Chloride: 102 mmol/L (ref 98–111)
Creatinine, Ser: 0.53 mg/dL (ref 0.44–1.00)
GFR calc Af Amer: >60 mL/min (ref >60)
GFR calc non Af Amer: >60 mL/min (ref >60)
Glucose, Bld: 168 mg/dL — ABNORMAL HIGH (ref 70–99)
Potassium: 3.7 mmol/L (ref 3.5–5.1)
Sodium: 137 mmol/L (ref 135–145)

## 2020-03-15 LAB — TYPE AND SCREEN
ABO/RH(D): A POS
Antibody Screen: NEGATIVE
Unit division: 0
Unit division: 0

## 2020-03-15 LAB — URINALYSIS, ROUTINE W REFLEX MICROSCOPIC
Bacteria, UA: NONE SEEN
Bilirubin Urine: NEGATIVE
Glucose, UA: NEGATIVE mg/dL
Hgb urine dipstick: NEGATIVE
Ketones, ur: NEGATIVE mg/dL
Leukocytes,Ua: NEGATIVE
Nitrite: NEGATIVE
Protein, ur: 100 mg/dL — AB
Specific Gravity, Urine: 1.026 (ref 1.005–1.030)
pH: 5 (ref 5.0–8.0)

## 2020-03-15 LAB — HEMOGLOBIN AND HEMATOCRIT, BLOOD
HCT: 25.8 % — ABNORMAL LOW (ref 36.0–46.0)
Hemoglobin: 8.1 g/dL — ABNORMAL LOW (ref 12.0–15.0)

## 2020-03-15 LAB — BPAM RBC
Blood Product Expiration Date: 202104042359
Blood Product Expiration Date: 202104152359
ISSUE DATE / TIME: 202103170250
Unit Type and Rh: 6200
Unit Type and Rh: 6200

## 2020-03-15 LAB — PHOSPHORUS: Phosphorus: 2.6 mg/dL (ref 2.5–4.6)

## 2020-03-15 LAB — MAGNESIUM: Magnesium: 1.8 mg/dL (ref 1.7–2.4)

## 2020-03-15 MED ORDER — HYDROMORPHONE BOLUS VIA INFUSION
1.0000 mg | INTRAVENOUS | Status: DC | PRN
  Administered 2020-03-15: 1 mg via INTRAVENOUS
  Filled 2020-03-15: qty 1

## 2020-03-15 MED ORDER — PANTOPRAZOLE SODIUM 40 MG PO TBEC: 40.0000 mg | DELAYED_RELEASE_TABLET | Freq: Every day | ORAL | Status: DC

## 2020-03-15 MED ORDER — MIDAZOLAM HCL 2 MG/2ML IJ SOLN
2.0000 mg | INTRAMUSCULAR | Status: DC | PRN
  Filled 2020-03-15: qty 2

## 2020-03-15 MED ORDER — FENTANYL BOLUS VIA INFUSION
50.0000 ug | INTRAVENOUS | Status: DC | PRN
  Administered 2020-03-15 (×6): 50 ug via INTRAVENOUS
  Filled 2020-03-15: qty 50

## 2020-03-15 MED ORDER — SODIUM CHLORIDE 0.9% IV SOLUTION: Freq: Once | INTRAVENOUS | Status: AC

## 2020-03-15 MED ORDER — FENTANYL 2500MCG IN NS 250ML (10MCG/ML) PREMIX INFUSION
50.0000 ug/h | INTRAVENOUS | Status: DC
  Administered 2020-03-15: 17:00:00 400 ug/h via INTRAVENOUS
  Administered 2020-03-15: 300 ug/h via INTRAVENOUS
  Filled 2020-03-15: qty 250

## 2020-03-15 MED ORDER — PANTOPRAZOLE SODIUM 40 MG PO PACK
40.0000 mg | PACK | Freq: Every day | ORAL | Status: DC
  Administered 2020-03-15 – 2020-03-16 (×2): 40 mg
  Filled 2020-03-15 (×2): qty 20

## 2020-03-15 MED ORDER — FENTANYL CITRATE (PF) 100 MCG/2ML IJ SOLN: 50.0000 ug | Freq: Once | INTRAMUSCULAR | Status: DC

## 2020-03-15 MED ORDER — MIDAZOLAM HCL 2 MG/2ML IJ SOLN
2.0000 mg | INTRAMUSCULAR | Status: DC | PRN
  Administered 2020-03-15: 2 mg via INTRAVENOUS
  Filled 2020-03-15 (×2): qty 2

## 2020-03-15 MED ORDER — SODIUM CHLORIDE 0.9 % IV SOLN
1.0000 mg/h | INTRAVENOUS | Status: DC
  Administered 2020-03-15: 2 mg/h via INTRAVENOUS
  Filled 2020-03-15 (×2): qty 5

## 2020-03-15 MED ORDER — SODIUM CHLORIDE 0.9 % IV SOLN
2.0000 g | Freq: Three times a day (TID) | INTRAVENOUS | Status: DC
  Administered 2020-03-15 – 2020-03-16 (×4): 2 g via INTRAVENOUS
  Filled 2020-03-15 (×4): qty 2

## 2020-03-15 MED ORDER — LIP MEDEX EX OINT
TOPICAL_OINTMENT | CUTANEOUS | Status: AC
  Filled 2020-03-15: qty 7

## 2020-03-15 NOTE — Progress Notes (Signed)
CRITICAL VALUE ALERT  Critical Value:  Hgb 6.6, Platelets 28  Date & Time Notied:  03/15/2020 0645   Provider Notified: E-Link  Orders Received/Actions taken: Waiting to receive new orders.

## 2020-03-15 NOTE — Progress Notes (Signed)
[facility] Physician-Brief Progress Note Patient Name: Terri Estes DOB: Mar 24, 1986 MRN: [id number]   Date of Service  03/15/2020  HPI/Events of Note  Hemoglobin 6.6 gm %  eICU Interventions  1 unit PRBC ordered to be transfused.        [name] 03/15/2020, 6:49 AM

## 2020-03-15 NOTE — Progress Notes (Signed)
Pharmacy Antibiotic Note  Terri Estes is a 34 y.o. female admitted on 03/03/2020 with respiratory failure requiring mechanical ventilation due to malignant breast cancer causing effusion. Concern for sepsis/  pneumonia due to fever spike overnight.  Pharmacy has been consulted for Cefepime dosing.  Tm 101.24F, WBC WNL, CrCL~46m/min  Plan: Cefepime 2gm IV q8h Monitor renal function and cx data   Height: _0  (157.5 cm) Weight: 152 lb 8.9 oz (69.2 kg) IBW/kg (Calculated) : 50.1  Temp (24hrs), Avg:99.6 F (37.6 C), Min:97.8 F (36.6 C), Max:101.1 F (38.4 C)  Recent Labs  Lab 03/04/2020 1750 03/12/20 1032 03/13/20 0350 03/14/20 0452 03/15/20 0553  WBC 8.2 7.9 7.9 7.2 5.4  CREATININE 0.51 0.69 0.66 0.57 0.53    Estimated Creatinine Clearance: 92.8 mL/min (by C-G formula based on SCr of 0.53 mg/dL).    No Known Allergies  Antimicrobials this admission: 3/20 Cefepime >>  3/18 Vanc x1 3/17 Valtrex>>  Dose adjustments this admission:  Microbiology results: 3/10 Pleural Fluid: negative 3/18 Resp Cx (BAL): NGTD  3/16 Resp PCR: COVID, influenza negative 3/18 MRSA PCR: negative  Thank you for allowing pharmacy to be a part of this patient's care.  MNetta CedarsPharmD, BCPS 03/15/2020 9:59 AM

## 2020-03-15 NOTE — Progress Notes (Signed)
NAME:  LAKITA SAHLIN, MRN:  [id number], DOB:  [birth year], LOS: 4 ADMISSION DATE:  03/21/2020, CONSULTATION DATE:  3/18 REFERRING MD:  [place] Crane, CHIEF COMPLAINT:  dyspnea   Brief History   34 y/o female admitted on 3/16 in the setting of severe hypoxemic respiratory failure due to metastatic breast cancer causing a malignant effusion.  She required intubation, mechanical ventilation and has undergone a bronchoscopy and XRT.   Past Medical History  Breast cancer s/p chemo/radiation and R mastectomy 2020 Smoker Asthma Anemia Anxiety Depression GERD Psoriasis Polysubstance abuse  Significant Hospital Events   3/18 pleurex, intubated, bronchoscopy 3/19 8Gy XRT R chest wall/SCLV region in attempt to shrink tumor in R hilum  Consults:  Radiation oncology Medical Oncology Palliative medicine IR  Procedures:  3/18 ETT >  3/18 R IJ CVL >  3/18 R Pleurex cath >   Significant Diagnostic Tests:    Micro Data:  3/18 BAL >  3/16 Res viral panel > negative  Antimicrobials:  Cefazolin 3/18 >  valtrex 3/18 >   Interim history/subjective:  Fever overnight  Objective   Blood pressure 112/71, pulse (!) 156, temperature (!) 101.1 F (38.4 C), temperature source Axillary, resp. rate (!) 28, height _0  (1.575 m), weight 69.2 kg, SpO2 100 %, not currently breastfeeding.    Vent Mode: PRVC FiO2 (%):  [40 %] 40 % Set Rate:  [20 bmp] 20 bmp Vt Set:  [320 mL-340 mL] 320 mL PEEP:  [8 cmH20] 8 cmH20 Plateau Pressure:  [23 cmH20-28 cmH20] 28 cmH20   Intake/Output Summary (Last 24 hours) at 03/15/2020 7989 Last data filed at 03/15/2020 0700 Gross per 24 hour  Intake 2903.94 ml  Output 1160 ml  Net 1743.94 ml   Filed Weights   02/29/2020 2259 03/14/20 0454 03/15/20 0500  Weight: 65.1 kg 66.3 kg 69.2 kg    Examination:  General:  Awake in bed on vent HENT: NCAT ETT in place PULM: No air movement on R B, vent supported breathing CV: RRR, no mgr GI: BS+, soft, nontender MSK:  normal bulk and tone Neuro:awake, follows commands    Resolved Hospital Problem list     Assessment & Plan:  Metastatic breast cancer: incurable, prognosis poor but hoping for relief of symptoms R hilar mass with R pleural effusion Monitor pleurex drainage Would monitor CXR/spontaneous breathing trials for at least 72 hours after receiving XRT prior to making any decisions about withdrawal of care Needs palliation   Acute respiratory failure with hypoxemia:  Full mechanical vent support VAP prevention Daily WUA/SBT  Daily fever, normal WBC Monitor for change in hemodynamics, WBC  Sinus tachycardia: pain/anxiety related? Though no fever with her cell lines dropping dropping, I'm worrised she could be septic  Shock PE related Tele resp culture Urinalysis Start cefepime  Need for sedation for mechanical ventilation RASS goal -2 Order PAD protocol Fentanyl infusion precedex infusion  Pancytopenia/anemia Monitor for bleeding Transfuse PRBC for Hgb < 7 gm/dL  Right PE, but now severely thrombocytopenia Hold anticoagulation Monitor for bleeding  Family considering code status, I spoke to mother and father at length today.  Her father is more engaged in conversation than mother. He understands she will likely not make it but is interested in giving her 72 hours after radiation therapy to see if her CXR improves.  I advised that I believed nothing we will do will stop her from dying from this and if she hasn't improved by Monday we should pursue full comfort measures.  Best practice:  Diet: tube feeding Pain/Anxiety/Delirium protocol (if indicated): Yes VAP protocol (if indicated): yes DVT prophylaxis: SCD GI prophylaxis: Pantoprazole for stress ulcer prophylaxis Glucose control: SSI Mobility: bed rest Code Status: full Family Communication: I updated both father and mother at length  Disposition: remain in ICU  Labs   CBC: Recent Labs  Lab 03/05/2020 1750  03/14/2020 1750 03/12/20 0020 03/12/20 1032 03/13/20 0350 03/14/20 0452 03/15/20 0553  WBC 8.2  --   --  7.9 7.9 7.2 5.4  NEUTROABS 7.4  --   --   --  6.9  --   --   HGB 7.2*   < > 7.5* 8.6* 8.9* 7.7* 6.6*  HCT 23.0*   < > 24.2* 27.6* 28.2* 25.4* 21.8*  MCV 100.4*  --   --  98.6 99.6 100.0 103.8*  PLT 90*  --   --  67* 49* 40* 28*   < > = values in this interval not displayed.    Basic Metabolic Panel: Recent Labs  Lab 03/19/2020 1750 03/12/20 1032 03/13/20 0350 03/13/20 1544 03/14/20 0452 03/15/20 0553  NA 136 134* 136  --  137 137  K 4.2 3.8 4.1  --  3.8 3.7  CL 98 95* 95*  --  102 102  CO2 _0 --  27 26  GLUCOSE 113* 103* 112*  --  131* 168*  BUN _1 --  14 12  CREATININE 0.51 0.69 0.66  --  0.57 0.53  CALCIUM 8.8* 8.8* 8.7*  --  8.0* 8.3*  MG  --   --   --  1.5* 1.6* 1.8  PHOS  --   --   --  4.7* 3.5 2.6   GFR: Estimated Creatinine Clearance: 92.8 mL/min (by C-G formula based on SCr of 0.53 mg/dL). Recent Labs  Lab 03/12/20 1032 03/13/20 0350 03/14/20 0452 03/15/20 0553  WBC 7.9 7.9 7.2 5.4    Liver Function Tests: Recent Labs  Lab 03/12/20 1032 03/14/20 0452  AST 39 41  ALT 36 36  ALKPHOS 89 100  BILITOT 0.9 0.9  PROT 5.9* 5.1*  ALBUMIN 2.5* 1.9*   No results for input(s): LIPASE, AMYLASE in the last 168 hours. No results for input(s): AMMONIA in the last 168 hours.  ABG    Component Value Date/Time   PHART 7.486 (H) 03/13/2020 1706   PCO2ART 37.1 03/13/2020 1706   PO2ART 356 (H) 03/13/2020 1706   HCO3 27.7 03/13/2020 1706   O2SAT >100.0 03/13/2020 1706     Coagulation Profile: No results for input(s): INR, PROTIME in the last 168 hours.  Cardiac Enzymes: No results for input(s): CKTOTAL, CKMB, CKMBINDEX, TROPONINI in the last 168 hours.  HbA1C: Hgb A1c MFr Bld  Date/Time Value Ref Range Status  12/16/2016 01:39 PM 5.3 4.8 - 5.6 % Final    Comment:             Pre-diabetes: 5.7 - 6.4          Diabetes: >6.4           Glycemic control for adults with diabetes: <7.0     CBG: Recent Labs  Lab 03/14/20 1146 03/14/20 1529 03/14/20 1953 03/14/20 2311 03/15/20 0734  GLUCAP 88 95 94 116* 156*    Review of Systems:   Cannot obtain due ton intubation  Past Medical History  She,  has a past medical history of Anemia, Anxiety, Asthma, Cancer ([place]), Chronic pain, Depression, Family history of breast  cancer, GERD (gastroesophageal reflux disease), History of substance abuse ([place]), Pregnancy induced hypertension, Psoriasis, Trichomonas infection, and Vaginal Pap smear, abnormal.   Surgical History    Past Surgical History:  Procedure Laterality Date  . CESAREAN SECTION  05/26/2008  . CESAREAN SECTION N/A 07/10/2017   Procedure: CESAREAN SECTION;  Surgeon: Florian Buff, MD;  Location: [facility];  Service: Obstetrics;  Laterality: N/A;  . CESAREAN SECTION N/A 09/23/2018   Procedure: CESAREAN SECTION;  Surgeon: Donnamae Jude, MD;  Location: [facility];  Service: Obstetrics;  Laterality: N/A;  . COLPOSCOPY    . MASTECTOMY WITH AXILLARY LYMPH NODE DISSECTION Right 10/16/2019   Procedure: RIGHT MASTECTOMY WITH AXILLARY LYMPH NODE DISSECTION;  Surgeon: Alphonsa Overall, MD;  Location: [facility];  Service: General;  Laterality: Right;  . PORTACATH PLACEMENT Left 04/30/2019   Procedure: INSERTION PORT-A-CATH WITH ULTRASOUND;  Surgeon: Alphonsa Overall, MD;  Location: [facility];  Service: General;  Laterality: Left;  Left Subclavian vein  . WISDOM TOOTH EXTRACTION       Social History   reports that she has been smoking cigarettes. She has been smoking about 0.10 packs per day for the past 0.00 years. She has never used smokeless tobacco. She reports current drug use. Frequency: 7.00 times per week. Drug: Marijuana. She reports that she does not drink alcohol.   Family History   Her family history includes Breast cancer in her paternal aunt, paternal grandmother, paternal great-grandmother, and another family  member; Breast cancer (age of onset: 2) in her maternal grandmother; Cerebral palsy in her child; Diabetes in her paternal grandmother; Kidney disease in her maternal grandfather, maternal grandmother, paternal grandfather, and paternal grandmother; Pancreatic cancer in an other family member; Throat cancer in her paternal uncle.   Allergies No Known Allergies   Home Medications  Prior to Admission medications   Medication Sig Start Date End Date Taking? Authorizing Provider  albuterol (VENTOLIN HFA) 108 (90 Base) MCG/ACT inhaler Inhale 2 puffs into the lungs every 6 (six) hours as needed for wheezing or shortness of breath. 02/15/20  Yes Magrinat, [name], MD  benzonatate (TESSALON) 100 MG capsule Take 1 capsule (100 mg total) by mouth 3 (three) times daily as needed for cough. 03/06/20  Yes Rai, Ripudeep K, MD  buprenorphine (SUBUTEX) 8 MG SUBL SL tablet Place 8 mg under the tongue 2 (two) times daily.    Yes [provider]  capecitabine (XELODA) 500 MG tablet Take 2 tablets (1,000 mg total) by mouth 2 (two) times daily after a meal. Take Monday-Friday. Take only on days of radiation. 02/08/20  Yes Magrinat, [name], MD  cholestyramine (QUESTRAN) 4 g packet Take 1 packet (4 g total) by mouth 3 (three) times daily with meals. Patient taking differently: Take 4 g by mouth 3 (three) times daily with meals as needed (diarrhea).  05/11/19  Yes Magrinat, [name], MD  clonazePAM (KLONOPIN) 1 MG tablet Take 1 tablet (1 mg total) by mouth at bedtime as needed for anxiety. 03/06/20 03/06/21 Yes Rai, Ripudeep K, MD  cyclobenzaprine (FLEXERIL) 10 MG tablet Take 1 tablet (10 mg total) by mouth 3 (three) times daily as needed for muscle spasms. 09/26/18  Yes Donnamae Jude, MD  dexamethasone (DECADRON) 4 MG tablet Take 1 tablet (4 mg total) by mouth daily with breakfast. 02/15/20  Yes Magrinat, [name], MD  Famotidine (PEPCID PO) Take 1 tablet by mouth daily.   Yes [provider]  gabapentin  (NEURONTIN) 300 MG  capsule TAKE 1 CAPSULE(300 MG) BY MOUTH THREE TIMES DAILY Patient taking differently: Take 300 mg by mouth 3 (three) times daily.  09/10/19  Yes Causey, Charlestine Massed, NP  guaiFENesin 200 MG tablet Take 200 mg by mouth every 4 (four) hours as needed for cough or to loosen phlegm.   Yes [provider]  HYDROcodone-homatropine (HYCODAN) 5-1.5 MG/5ML syrup Take 5 mLs by mouth every 6 (six) hours as needed for cough. 03/06/20  Yes Rai, Ripudeep K, MD  metoCLOPramide (REGLAN) 10 MG tablet Take 1 tablet (10 mg total) by mouth every 8 (eight) hours as needed for nausea. 11/29/19  Yes Caccavale, Sophia, PA-C  ondansetron (ZOFRAN) 8 MG tablet Take 1 tablet (8 mg total) by mouth every 8 (eight) hours as needed for nausea or vomiting. 11/21/19  Yes Magrinat, [name], MD  oxyCODONE (OXY IR/ROXICODONE) 5 MG immediate release tablet Take 1 tablet (5 mg total) by mouth every 6 (six) hours as needed for severe pain. 02/29/20  Yes Kyung Rudd, MD  Rivaroxaban 15 & 20 MG TBPK Follow package directions: Take one 74m tablet by mouth twice a day. On day 22 (03/26/2020), switch to one 23mtablet once a day. Take with food. 03/06/20  Yes Rai, Ripudeep K, MD  valACYclovir (VALTREX) 1000 MG tablet Take 1 tablet (1,000 mg total) by mouth 2 (two) times daily. Patient taking differently: Take 1,000 mg by mouth 2 (two) times daily as needed (viral outbreak).  06/26/19  Yes Causey, LiCharlestine MassedNP  albuterol (VENTOLIN HFA) 108 (90 Base) MCG/ACT inhaler Inhale 2 puffs into the lungs 4 (four) times daily. Patient not taking: Reported on 03/24/2020 02/15/20   Magrinat, GuVirgie DadMD  rivaroxaban (XARELTO) 20 MG TABS tablet Take 1 tablet (20 mg total) by mouth daily with supper. Please start after the starter pack has been completed 03/26/20   Rai, Ripudeep K, MD  enalapril (VASOTEC) 10 MG tablet TAKE 1 TABLET BY MOUTH DAILY Patient taking differently: Take 10 mg by mouth daily.  03/15/19   PrDonnamae JudeMD      Critical care time: 4511inutes   BrRoselie AwkwardMD [place]ken [place]ager: [phone]: [phone] no response, call [phone]

## 2020-03-16 ENCOUNTER — Inpatient Hospital Stay (HOSPITAL_COMMUNITY): Payer: Medicaid Other

## 2020-03-16 ENCOUNTER — Encounter (HOSPITAL_COMMUNITY): Payer: Self-pay | Admitting: Internal Medicine

## 2020-03-16 DIAGNOSIS — Z9911 Dependence on respirator [ventilator] status: Secondary | ICD-10-CM

## 2020-03-16 DIAGNOSIS — R579 Shock, unspecified: Secondary | ICD-10-CM

## 2020-03-16 DIAGNOSIS — Z7189 Other specified counseling: Secondary | ICD-10-CM

## 2020-03-16 DIAGNOSIS — J9602 Acute respiratory failure with hypercapnia: Secondary | ICD-10-CM

## 2020-03-16 DIAGNOSIS — E872 Acidosis: Secondary | ICD-10-CM

## 2020-03-16 LAB — COMPREHENSIVE METABOLIC PANEL
ALT: 27 U/L (ref 0–44)
AST: 54 U/L — ABNORMAL HIGH (ref 15–41)
Albumin: 2.4 g/dL — ABNORMAL LOW (ref 3.5–5.0)
Alkaline Phosphatase: 70 U/L (ref 38–126)
Anion gap: 9 (ref 5–15)
BUN: 28 mg/dL — ABNORMAL HIGH (ref 6–20)
CO2: 24 mmol/L (ref 22–32)
Calcium: 7.2 mg/dL — ABNORMAL LOW (ref 8.9–10.3)
Chloride: 105 mmol/L (ref 98–111)
Creatinine, Ser: 0.98 mg/dL (ref 0.44–1.00)
GFR calc Af Amer: >60 mL/min (ref >60)
GFR calc non Af Amer: >60 mL/min (ref >60)
Glucose, Bld: 177 mg/dL — ABNORMAL HIGH (ref 70–99)
Potassium: 4.3 mmol/L (ref 3.5–5.1)
Sodium: 138 mmol/L (ref 135–145)
Total Bilirubin: 1.2 mg/dL (ref 0.3–1.2)
Total Protein: 5.1 g/dL — ABNORMAL LOW (ref 6.5–8.1)

## 2020-03-16 LAB — BLOOD GAS, ARTERIAL
Acid-Base Excess: 0.5 mmol/L (ref 0.0–2.0)
Acid-base deficit: 8.8 mmol/L — ABNORMAL HIGH (ref 0.0–2.0)
Bicarbonate: 25.1 mmol/L (ref 20.0–28.0)
Bicarbonate: 28.5 mmol/L — ABNORMAL HIGH (ref 20.0–28.0)
FIO2: 100
FIO2: 80
MECHVT: 340 mL
O2 Saturation: 98.7 %
O2 Saturation: 99 %
Patient temperature: 98.6
Patient temperature: 102.6
RATE: 20 resp/min
pCO2 arterial: 93.9 mmHg (ref 32.0–48.0)
pCO2 arterial: 70.3 mmHg (ref 32.0–48.0)
pH, Arterial: 7.055 — CL (ref 7.350–7.450)
pH, Arterial: 7.246 — ABNORMAL LOW (ref 7.350–7.450)
pO2, Arterial: 186 mmHg — ABNORMAL HIGH (ref 83.0–108.0)
pO2, Arterial: 140 mmHg — ABNORMAL HIGH (ref 83.0–108.0)

## 2020-03-16 LAB — TYPE AND SCREEN
ABO/RH(D): A POS
Antibody Screen: NEGATIVE
Unit division: 0

## 2020-03-16 LAB — CBC WITH DIFFERENTIAL/PLATELET
Abs Immature Granulocytes: 0.11 10*3/uL — ABNORMAL HIGH (ref 0.00–0.07)
Basophils Absolute: 0 10*3/uL (ref 0.0–0.1)
Basophils Relative: 0 %
Eosinophils Absolute: 0 10*3/uL (ref 0.0–0.5)
Eosinophils Relative: 0 %
HCT: 23.4 % — ABNORMAL LOW (ref 36.0–46.0)
Hemoglobin: 7 g/dL — ABNORMAL LOW (ref 12.0–15.0)
Immature Granulocytes: 3 %
Lymphocytes Relative: 5 %
Lymphs Abs: 0.2 10*3/uL — ABNORMAL LOW (ref 0.7–4.0)
MCH: 30.2 pg (ref 26.0–34.0)
MCHC: 29.9 g/dL — ABNORMAL LOW (ref 30.0–36.0)
MCV: 100.9 fL — ABNORMAL HIGH (ref 80.0–100.0)
Monocytes Absolute: 0.2 10*3/uL (ref 0.1–1.0)
Monocytes Relative: 5 %
Neutro Abs: 3.7 10*3/uL (ref 1.7–7.7)
Neutrophils Relative %: 87 %
Platelets: 14 10*3/uL — CL (ref 150–400)
RBC: 2.32 MIL/uL — ABNORMAL LOW (ref 3.87–5.11)
RDW: 20.3 % — ABNORMAL HIGH (ref 11.5–15.5)
WBC: 4.2 10*3/uL (ref 4.0–10.5)
nRBC: 6.8 % — ABNORMAL HIGH (ref 0.0–0.2)

## 2020-03-16 LAB — PROTIME-INR
INR: 1.9 — ABNORMAL HIGH (ref 0.8–1.2)
Prothrombin Time: 21.3 seconds — ABNORMAL HIGH (ref 11.4–15.2)

## 2020-03-16 LAB — BPAM RBC
Blood Product Expiration Date: 202104082359
ISSUE DATE / TIME: 202103200942
Unit Type and Rh: 6200

## 2020-03-16 LAB — GLUCOSE, CAPILLARY
Glucose-Capillary: 89 mg/dL (ref 70–99)
Glucose-Capillary: 90 mg/dL (ref 70–99)

## 2020-03-16 LAB — D-DIMER, QUANTITATIVE: D-Dimer, Quant: >20 ug/mL-FEU — ABNORMAL HIGH (ref 0.00–0.50)

## 2020-03-16 LAB — CULTURE, RESPIRATORY W GRAM STAIN: Culture: NORMAL

## 2020-03-16 LAB — MAGNESIUM: Magnesium: 1.7 mg/dL (ref 1.7–2.4)

## 2020-03-16 LAB — FIBRINOGEN: Fibrinogen: 455 mg/dL (ref 210–475)

## 2020-03-16 MED ORDER — IOHEXOL 350 MG/ML SOLN
100.0000 mL | Freq: Once | INTRAVENOUS | Status: AC | PRN
  Administered 2020-03-16: 08:00:00 100 mL via INTRAVENOUS

## 2020-03-16 MED ORDER — ACETAMINOPHEN 650 MG RE SUPP: 650.0000 mg | Freq: Four times a day (QID) | RECTAL | Status: DC | PRN

## 2020-03-16 MED ORDER — LACTATED RINGERS IV BOLUS
500.0000 mL | Freq: Once | INTRAVENOUS | Status: AC
  Administered 2020-03-16: 500 mL via INTRAVENOUS

## 2020-03-16 MED ORDER — GLYCOPYRROLATE 0.2 MG/ML IJ SOLN: 0.2000 mg | INTRAMUSCULAR | Status: DC | PRN

## 2020-03-16 MED ORDER — STERILE WATER FOR INJECTION IV SOLN
INTRAVENOUS | Status: DC
  Filled 2020-03-16 (×2): qty 850

## 2020-03-16 MED ORDER — ALBUMIN HUMAN 5 % IV SOLN
25.0000 g | Freq: Once | INTRAVENOUS | Status: AC
  Administered 2020-03-16: 04:00:00 25 g via INTRAVENOUS
  Filled 2020-03-16: qty 500

## 2020-03-16 MED ORDER — POLYVINYL ALCOHOL 1.4 % OP SOLN: 1.0000 [drp] | Freq: Four times a day (QID) | OPHTHALMIC | Status: DC | PRN

## 2020-03-16 MED ORDER — ALBUMIN HUMAN 25 % IV SOLN
INTRAVENOUS | Status: AC
  Filled 2020-03-16: qty 50

## 2020-03-16 MED ORDER — SODIUM BICARBONATE 8.4 % IV SOLN
INTRAVENOUS | Status: AC
  Filled 2020-03-16: qty 50

## 2020-03-16 MED ORDER — MIDAZOLAM HCL 2 MG/2ML IJ SOLN: 2.0000 mg | INTRAMUSCULAR | Status: DC | PRN

## 2020-03-16 MED ORDER — DIPHENHYDRAMINE HCL 50 MG/ML IJ SOLN: 25.0000 mg | INTRAMUSCULAR | Status: DC | PRN

## 2020-03-16 MED ORDER — ALBUMIN HUMAN 5 % IV SOLN
25.0000 g | Freq: Once | INTRAVENOUS | Status: AC
  Administered 2020-03-16: 25 g via INTRAVENOUS
  Filled 2020-03-16: qty 250

## 2020-03-16 MED ORDER — METHYLPREDNISOLONE SODIUM SUCC 125 MG IJ SOLR
60.0000 mg | Freq: Two times a day (BID) | INTRAMUSCULAR | Status: DC
  Administered 2020-03-16: 60 mg via INTRAVENOUS

## 2020-03-16 MED ORDER — SODIUM CHLORIDE (PF) 0.9 % IJ SOLN
INTRAMUSCULAR | Status: AC
  Filled 2020-03-16: qty 50

## 2020-03-16 MED ORDER — GLYCOPYRROLATE 1 MG PO TABS: 1.0000 mg | ORAL_TABLET | ORAL | Status: DC | PRN

## 2020-03-16 MED ORDER — ACETAMINOPHEN 325 MG PO TABS: 650.0000 mg | ORAL_TABLET | Freq: Four times a day (QID) | ORAL | Status: DC | PRN

## 2020-03-17 ENCOUNTER — Ambulatory Visit: Payer: Medicaid Other

## 2020-03-17 ENCOUNTER — Encounter: Payer: Self-pay | Admitting: *Deleted

## 2020-03-17 LAB — CULTURE, RESPIRATORY W GRAM STAIN
Culture: NORMAL
Special Requests: NORMAL

## 2020-03-18 ENCOUNTER — Other Ambulatory Visit: Payer: Self-pay | Admitting: Oncology

## 2020-03-18 ENCOUNTER — Ambulatory Visit: Payer: Medicaid Other

## 2020-03-19 ENCOUNTER — Ambulatory Visit: Payer: Medicaid Other

## 2020-03-20 ENCOUNTER — Inpatient Hospital Stay: Payer: Medicaid Other

## 2020-03-20 ENCOUNTER — Ambulatory Visit: Payer: Medicaid Other

## 2020-03-20 ENCOUNTER — Inpatient Hospital Stay: Payer: Medicaid Other | Admitting: Oncology

## 2020-03-21 ENCOUNTER — Ambulatory Visit: Payer: Medicaid Other

## 2020-03-21 DIAGNOSIS — R627 Adult failure to thrive: Secondary | ICD-10-CM | POA: Diagnosis present

## 2020-03-21 DIAGNOSIS — E46 Unspecified protein-calorie malnutrition: Secondary | ICD-10-CM | POA: Diagnosis present

## 2020-03-21 LAB — CYTOLOGY - NON PAP

## 2020-03-24 ENCOUNTER — Ambulatory Visit: Payer: Medicaid Other

## 2020-03-25 ENCOUNTER — Ambulatory Visit: Payer: Medicaid Other

## 2020-03-26 ENCOUNTER — Ambulatory Visit: Payer: Medicaid Other

## 2020-03-27 ENCOUNTER — Ambulatory Visit: Payer: Medicaid Other

## 2020-03-27 NOTE — Accreditation Note (Signed)
Restraints not reported to CMS Pursuant to regulation 482.13 (G) (3) use of soft wrist restraints.

## 2020-03-27 NOTE — Progress Notes (Signed)
Wasted 50 mL IV dilaudid from bag and 190 mL IV Fentanyl from bag with Danise Mina, RN.

## 2020-03-27 NOTE — Procedures (Signed)
Extubation Procedure Note  Patient Details:   Name: Terri ANDREASON DOB: 1986/04/21 MRN: [id number]   Airway Documentation:  Airway 8 mm (Active)  Secured at (cm) 21 cm 04-13-2020 0759  Measured From Lips 13-Apr-2020 0759  Secured Location Left 13-Apr-2020 0759  Secured By [employer] 04-13-2020 0759  Tube Holder Repositioned Yes Apr 13, 2020 0759  Cuff Pressure (cm H2O) 28 cm H2O 03/15/20 2000  Site Condition Dry Apr 13, 2020 0759   Vent end date: 2020/04/13 Vent end time: 1402   Evaluation  O2 sats: currently acceptable Complications: No apparent complications Patient did tolerate procedure well. Bilateral Breath Sounds: Rhonchi, Coarse crackles   No    Patient extubated for comfort care; placed on 2 L for comfort.   Lamonte Sakai 04-13-2020, 2:07 PM

## 2020-03-27 NOTE — Progress Notes (Signed)
Patient was transported to CT this morning at shift change. Transport uneventful.

## 2020-03-27 NOTE — Progress Notes (Signed)
CRITICAL VALUE ALERT  Critical Value:  PCO2 70.3  Date & Time Notied:  2020-03-24 [id number]  Provider Notified: Carlis Abbott, MD   Orders Received/Actions taken: Awaiting new orders

## 2020-03-27 NOTE — Progress Notes (Signed)
[facility] Progress Note Patient Name: Terri Estes DOB: 1986/10/08 MRN: [id number]   Date of Service  2020-04-04  HPI/Events of Note  Pt with persistent tachycardia, CXR shows white out of right lung, saturation marginal in low 90's.  eICU Interventions  CT chest to evaluate effusion/ collapse and r/o PE as well, FIO2 increased to 60 %, PEEP increased to 10. RN instructed to try to wean Levophed to see if it is driving the tachycardia.        Kerry Kass Terralyn Matsumura Apr 04, 2020, 6:07 AM

## 2020-03-27 NOTE — Progress Notes (Signed)
[facility] Progress Note Patient Name: Terri Estes DOB: 1986/01/11 MRN: [id number]   Date of Service  2020/04/15  HPI/Events of Note  Pt with marked sinus tachycardia and hypotension, ECHO last May showed normal LV function with EF of 60-65 %.  eICU Interventions  Lactated Ringers 500 ml iv bolus x 1, Albumin 5 % 500 ml iv bolus x 1, 12 lead  EKG to verify rhythm.        Kerry Kass Terri Estes 04-15-20, 1:59 AM

## 2020-03-27 NOTE — Progress Notes (Signed)
When I arrived pt's mother was bedside. Throughout the course of the day, other family members arrived and said good byes. I had prayer w/th pt's mother when she was alone and again with family members. I provided spiritual care, empathic presence and guidance with visitation. Family received support through extubation until pt passed a few moments afterwards. Pt's mother had be controled and sobbed at her daughter's passed, appropriately. Other family members were very tearful, at times wailing in the process of accepting their loss. I escorted pt's mother, father, and fiance to the exits when they were ready to leave. They had not selected a funeral home. I gave them the number to bed control to call back when they make their selection. Please page [phone] if you have questions or need additional information. [place], [place]   28-Mar-2020 1400  Clinical Encounter Type  Visited With Family

## 2020-03-27 NOTE — Progress Notes (Signed)
[facility] Progress Note Patient Name: Terri Estes DOB: 06/01/1986 MRN: [id number]   Date of Service  10-Apr-2020  HPI/Events of Note  Pt with persistent sinus tachycardia despite fluid bolus of 1000 ml, Pt also with hypotension and low diastolic pressure, [id number] serosanguinous output from chest tube this shift.  eICU Interventions  CXR to screen for pericardial fluid with possibility of tamponade, if indicative will ask bedside to got to [facility] for bedside ultrasound, CBC to exclude acute blood loss anemia, will give 25 gm of 25 % Albumin while awaiting results.        Kerry Kass [name] April 10, 2020, 3:41 AM

## 2020-03-27 NOTE — Discharge Summary (Signed)
Physician Discharge Summary   Patient ID: Terri Estes [id number] 34 y.o. 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] Recurrent pleural effusion on right [J90] Symptomatic anemia [D64.9] Metastatic breast cancer ([place]) [C50.919]  Discharge Diagnoses:  acute hypoxic and hypercapneic respiratory failure Acute respiratory acidosis Shock- likely distributive and obstructive Metastatic breast cancer Malignant pleural effusion Lobar Pneumonia Sinus tachycardia Anemia, thrombocytopenia Pulmonary embolus  Admission Condition: serious  Discharged Condition: deceased  Indication for Admission:  Recurrent malignant pleural effusion, acute anemia, respiratory failure, pulmonary embolus, metastatic breast cancer  Hospital Course:  Terri Estes was admitted on 3/16 with symptomatic malignant pleural effusion, acute anemia, pulmonary embolus, metastatic breast cancer.  During her admission respiratory failure due to rapidly progressive right lung mass causing complete bronchial obstruction, effectively causing single lung ventilation on the left.  She had an enlarging malignant pleural effusion on the right.  She was emergently intubated due to progressive dyspnea with hope for salvage radiation therapy, which did not reverse her course.  Unfortunately she had progressive respiratory failure with hypoxia and hypercapnia, and developed a new left pleural effusion and left-sided infiltrates that are suspicious for spread of malignancy versus radiation damage versus new source of infection despite treatment for all 3.  She did grow progressive shock, which likely could be due to obstruction of the right heart vs distributive.  No evidence of pericardial effusion causing tamponade.   Due to ongoing hemodynamic deterioration despite ongoing aggressive care measures for her acute and chronic malignancy, the decision was made to withdraw aggressive care.  Her family had an opportunity to visit prior to palliative extubation and her fianc and parents are with her during her final moments. She passed away on 04-15-20 at 14:08.  Consults: pulmonary/intensive care, Palliative Care, Radiation Oncology  Significant Diagnostic Studies: microbiology: sputum culture: negative   ABG    Component Value Date/Time   PHART 7.246 (L) 2020-04-15 0845   PCO2ART 70.3 (HH) 04/15/20 0845   PO2ART 140 (H) April 15, 2020 0845   HCO3 28.5 (H) 04-15-20 0845   ACIDBASEDEF 8.8 (H) 04-15-2020 0720   O2SAT 99.0 2020/04/15 0845     Treatments: IV hydration, antibiotics, analgesia, steroids, respiratory therapy,chest tube to suction, radiation therapy.    Disposition:  Funeral home of family's choosing    Signed: Julian Hy 04-15-20 6:55 PM

## 2020-03-27 NOTE — Progress Notes (Signed)
NAME:  Terri Estes, MRN:  [id number], DOB:  October 01, 1986, LOS: 5 ADMISSION DATE:  03/02/2020, CONSULTATION DATE:  3/18 REFERRING MD:  [place] Crane, CHIEF COMPLAINT:  dyspnea   Brief History   33 y/o female admitted on 3/16 in the setting of severe hypoxemic respiratory failure due to metastatic breast cancer causing a malignant effusion.  She required intubation, mechanical ventilation and has undergone a bronchoscopy and XRT.   Past Medical History  Breast cancer s/p chemo/radiation and R mastectomy 2020 Smoker Asthma Anemia Anxiety Depression GERD Psoriasis Polysubstance abuse  Significant Hospital Events   3/18 pleurex, intubated, bronchoscopy 3/19 8Gy XRT R chest wall/SCLV region in attempt to shrink tumor in R hilum  Consults:  Radiation oncology Medical Oncology Palliative medicine IR  Procedures:  3/18 ETT >  3/18 R IJ CVL >  3/18 R Pleurex cath >   Significant Diagnostic Tests:    Micro Data:  3/18 BAL >  3/16 Res viral panel > negative  Antimicrobials:  Cefazolin 3/18 >  valtrex 3/18 >  Cefepime vanc   Interim history/subjective:  Worsening hypotension and tachycardia overnight. Chest tube continues to drain.  Objective   Blood pressure (!) 84/56, pulse (!) 169, temperature (!) 101.8 F (38.8 C), temperature source Oral, resp. rate (!) 29, height _0  (1.575 m), weight 69.2 kg, SpO2 92 %, not currently breastfeeding.    Vent Mode: PRVC FiO2 (%):  [40 %-80 %] 80 % Set Rate:  [20 bmp] 20 bmp Vt Set:  [340 mL-400 mL] 400 mL PEEP:  [5 cmH20-10 cmH20] 10 cmH20 Plateau Pressure:  [29 cmH20-45 cmH20] 45 cmH20   Intake/Output Summary (Last 24 hours) at 2020/03/24 0829 Last data filed at 03-24-2020 0801 Gross per 24 hour  Intake 5484.63 ml  Output 1215 ml  Net 4269.63 ml   Filed Weights   02/25/2020 2259 03/14/20 0454 03/15/20 0500  Weight: 65.1 kg 66.3 kg 69.2 kg    Examination:  General: Critically ill-appearing woman intubated, heavily sedated  HENT: AT, ETT in place PULM: No breath sounds on the right, minimal breath sounds on the left.  Air hungry.  High vent pressures.  Right-sided Pleurx in place draining clear bloody fluid CV: Tachycardic, regular rhythm, no murmurs GI: Obese, soft, nontender MSK:  normal muscle mass for age Neuro: RASS -5, not withdrawing to pain, not responsive during physical exam  CTA 3/21-new left-sided circumferential pleural effusion, left infiltrate with air bronchograms.  Right-sided persistent effusion versus soft tissue density mass, a few loculated air bubbles.  Densely consolidated lung without aeration.  Persistent mediastinal shift to the left.  No PE.  Bedside ultrasound-small dependent simple appearing pleural effusion on the left    Resolved Hospital Problem list     Assessment & Plan:  Metastatic breast cancer: incurable, prognosis poor but hoping for relief of symptoms R hilar mass with R pleural effusion.  New left-sided pleural effusion and infiltrate-unclear if this is extension of malignancy, radiation induced damage, versus new infection. -Continue Pleurx drainage. -Goals of care discussion with the family.  At this point putting a new left-sided chest tube in is unlikely to change her overall prognosis. -Aggressive pain and symptom control -At the family's request, have requested the chaplain at bedside.  Liberalize family visitation today.   Acute respiratory failure with hypoxemia and hypercapnia, newly worsened overnight. Possible left, no left pleural effusion.  Persistent right hilar mass causing complete right lung atelectasis. -Continue full vent support.  High peak and plateau pressures, unable  to ventilate appropriately at goal. -VAP prevention -Too unstable for SAT and SBT today. -Continue fentanyl and Precedex for appropriate symptom control  Acute respiratory acidosis -Aggressive hyperventilation -Bicarb push and drip  Daily fever, normal WBC -Continue empiric  antibiotics  Sinus tachycardia-persistent despite aggressive intervention.  I am concerned this is due to progressive obstruction from her right heart right hilar mass, enlarging pleural effusion on the left causing compression, worsening hypoxia. Also due to shock, PE. -Continue supportive care -Bicarb drip -Continue to monitor on telemetry -Continue broad-spectrum antibiotics -Family understands that with persistent tachycardia she is at risk of cardiac arrest -Minimizing chronotropic vasopressors, maximizing vasopressin and Nesynephrine.  Need for sedation for mechanical ventilation -RASS goal -4 -Continue Precedex and fentanyl -Benzos as needed  Pancytopenia/anemia. Unlikely DIC with normal fibrinogen -Transfuse for hemoglobin less than 7 -Continue to monitor for bleeding -Check fibrinogen, D-dimer, INR  Right PE, but now severely thrombocytopenia -Unfortunately unable to continue anticoagulation given severe thrombocytopenia -Continue to monitor for bleeding  Family discussions today with father, mother, fianc.  Made DNR and likely planning on withdrawing this afternoon.  Best practice:  Diet: tube feeding Pain/Anxiety/Delirium protocol (if indicated): Yes VAP protocol (if indicated): yes DVT prophylaxis: SCD GI prophylaxis: Pantoprazole for stress ulcer prophylaxis Glucose control: SSI Mobility: bed rest Code Status: full Family Communication:  Disposition: remain in ICU  Labs   CBC: Recent Labs  Lab 03/25/2020 1750 03/12/20 0020 03/12/20 1032 03/12/20 1032 03/13/20 0350 03/14/20 0452 03/15/20 0553 03/15/20 1334 04-10-2020 0338  WBC 8.2  --  7.9  --  7.9 7.2 5.4  --  4.2  NEUTROABS 7.4  --   --   --  6.9  --   --   --  3.7  HGB 7.2*   < > 8.6*   < > 8.9* 7.7* 6.6* 8.1* 7.0*  HCT 23.0*   < > 27.6*   < > 28.2* 25.4* 21.8* 25.8* 23.4*  MCV 100.4*  --  98.6  --  99.6 100.0 103.8*  --  100.9*  PLT 90*  --  67*  --  49* 40* 28*  --  14*   < > = values in this  interval not displayed.    Basic Metabolic Panel: Recent Labs  Lab 03/12/20 1032 03/13/20 0350 03/13/20 1544 03/14/20 0452 03/15/20 0553 04/10/2020 0338  NA 134* 136  --  137 137 138  K 3.8 4.1  --  3.8 3.7 4.3  CL 95* 95*  --  102 102 105  CO2 29 29  --  _0 GLUCOSE 103* 112*  --  131* 168* 177*  BUN 17 13  --  14 12 28*  CREATININE 0.69 0.66  --  0.57 0.53 0.98  CALCIUM 8.8* 8.7*  --  8.0* 8.3* 7.2*  MG  --   --  1.5* 1.6* 1.8 1.7  PHOS  --   --  4.7* 3.5 2.6  --    GFR: Estimated Creatinine Clearance: 75.8 mL/min (by C-G formula based on SCr of 0.98 mg/dL). Recent Labs  Lab 03/13/20 0350 03/14/20 0452 03/15/20 0553 April 10, 2020 0338  WBC 7.9 7.2 5.4 4.2    Liver Function Tests: Recent Labs  Lab 03/12/20 1032 03/14/20 0452 04-10-2020 0338  AST 39 41 54*  ALT 36 36 27  ALKPHOS 89 100 70  BILITOT 0.9 0.9 1.2  PROT 5.9* 5.1* 5.1*  ALBUMIN 2.5* 1.9* 2.4*   No results for input(s): LIPASE, AMYLASE in the last 168  hours. No results for input(s): AMMONIA in the last 168 hours.  ABG    Component Value Date/Time   PHART 7.055 (LL) 23-Mar-2020 0720   PCO2ART 93.9 (HH) 03-23-20 0720   PO2ART 186 (H) March 23, 2020 0720   HCO3 25.1 03-23-20 0720   ACIDBASEDEF 8.8 (H) 03-23-2020 0720   O2SAT 98.7 03-23-20 0720     Coagulation Profile: No results for input(s): INR, PROTIME in the last 168 hours.  Cardiac Enzymes: No results for input(s): CKTOTAL, CKMB, CKMBINDEX, TROPONINI in the last 168 hours.  HbA1C: Hgb A1c MFr Bld  Date/Time Value Ref Range Status  12/16/2016 01:39 PM 5.3 4.8 - 5.6 % Final    Comment:             Pre-diabetes: 5.7 - 6.4          Diabetes: >6.4          Glycemic control for adults with diabetes: <7.0     CBG: Recent Labs  Lab 03/15/20 1612 03/15/20 1940 03/15/20 2347 March 23, 2020 0343 03/23/2020 0813  GLUCAP 114* 96 105* 89 90    This patient is critically ill with multiple organ system failure which requires frequent high  complexity decision making, assessment, support, evaluation, and titration of therapies. This was completed through the application of advanced monitoring technologies and extensive interpretation of multiple databases. During this encounter critical care time was devoted to patient care services described in this note for 65 minutes.  Julian Hy, DO 2020/03/23 1:00 PM [facility]

## 2020-03-27 NOTE — Plan of Care (Addendum)
I spoke to her father Kora Lozano this morning to update him on her worsening hypoxia, shock, tachycardia. CT scan shows infiltrates in the left lung and new pleural effusion. This could be infectious, metastatic, or side effect of radiation. He understands that this worsening is a setback that makes it less likely that she will recover from this. He would like to liberalize visitation today knowing that this is the end of her life. He has called her mother and fiance. We will discuss her code status with both parents when they arrive later this morning.  Julian Hy, DO 04-01-2020 8:44 AM [facility]      Had discussion at bedside with patient's mother, father, and fiance at bedside. They have changed code status to DNR given her progressive illness. They are planning to withdraw aggressive care measures this afternoon. Family is visiting and likely they will withdraw later this afternoon. Chaplain at bedside.  Julian Hy, DO 04/01/2020 12:50 PM [facility]

## 2020-03-27 NOTE — Progress Notes (Signed)
[facility] Progress Note Patient Name: Terri Estes DOB: 03/25/1986 MRN: [id number]   Date of Service  04-07-2020  HPI/Events of Note  CXR shows opacification of the right lung.  eICU Interventions  I discussed with bedside regarding urgent  Thoracentesis, he will review the CXR.        Kerry Kass Jese Comella 04/07/2020, 5:11 AM

## 2020-03-27 DEATH — deceased

## 2020-04-07 IMAGING — CT CT ABDOMEN AND PELVIS WITH CONTRAST
2 of 4 series · 16 of 46 positions shown, 18 images · IV contrast (OMNIPAQUE)
Comparison: None.

CLINICAL DATA: Right-sided breast cancer

EXAM:
CT ABDOMEN AND PELVIS WITH CONTRAST
TECHNIQUE: Multidetector CT imaging of the abdomen and pelvis was performed
using the standard protocol following bolus administration of
intravenous contrast.
CONTRAST:  100mL OMNIPAQUE IOHEXOL 300 MG/ML  SOLN

[Series 2: axial st · axial · 0.67mm/px · z∈[-456,-61]mm · 13 of 89 slices shown, 15 images]
[im 5/89  soft-tissue]
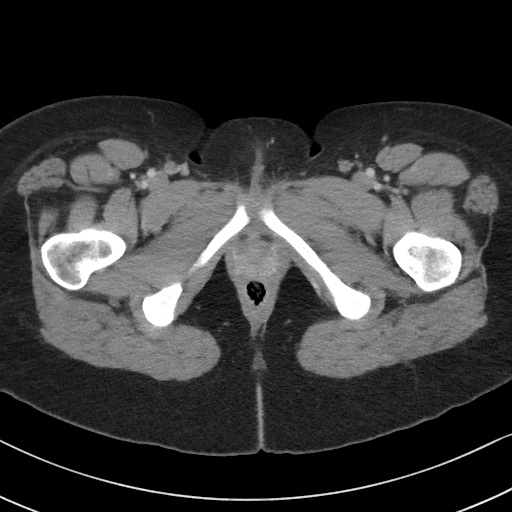
[im 5/89  bone]
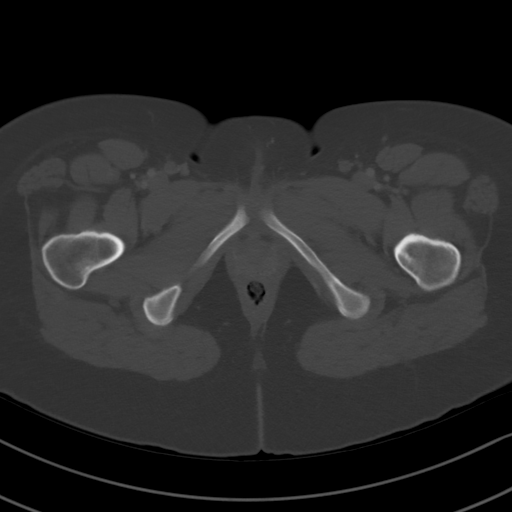
[im 10/89  soft-tissue]
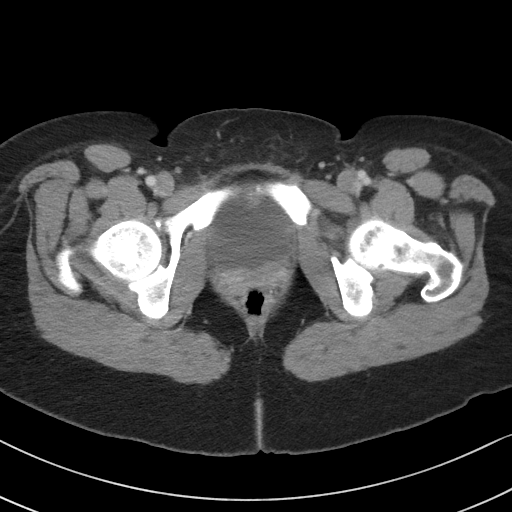
[im 20/89  soft-tissue]
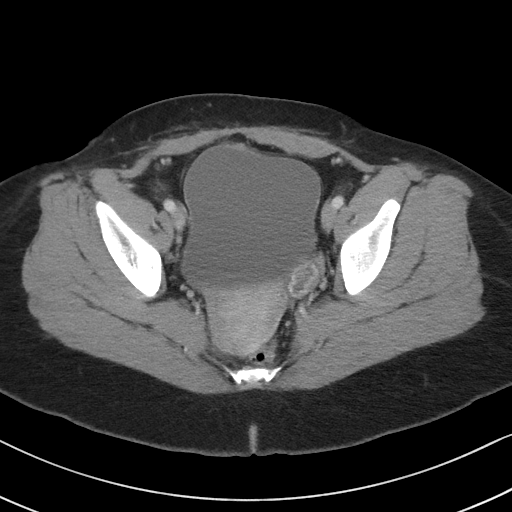
[im 25/89  soft-tissue]
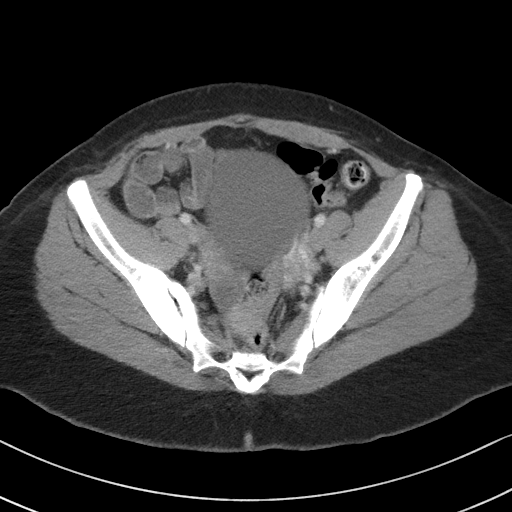
[im 30/89  soft-tissue]
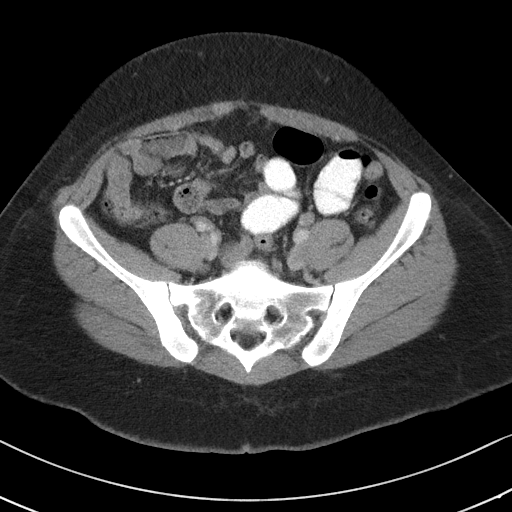
[im 40/89  soft-tissue]
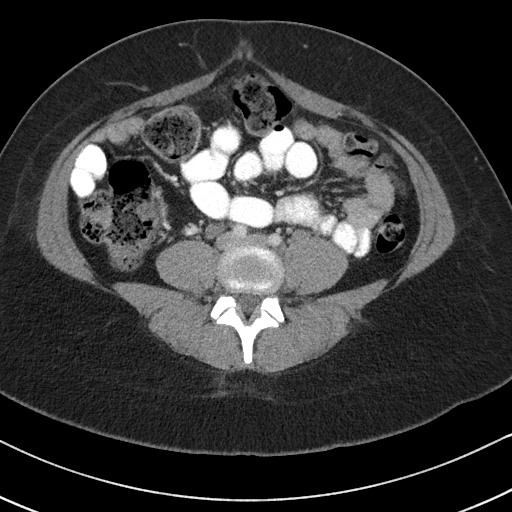
[im 45/89  soft-tissue]
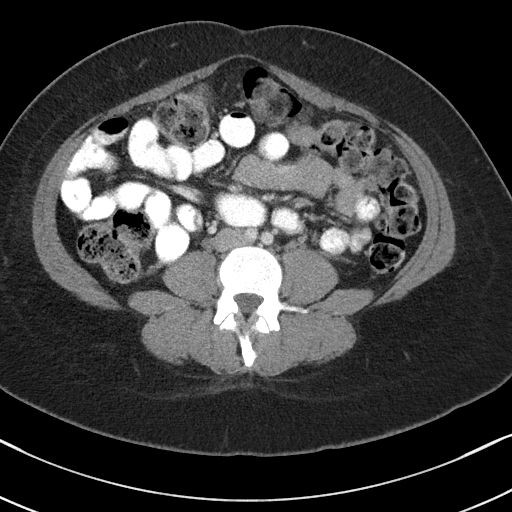
[im 49/89  soft-tissue]
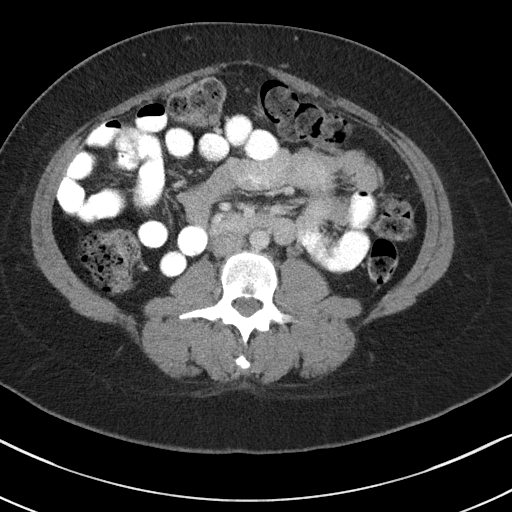
[im 59/89  soft-tissue]
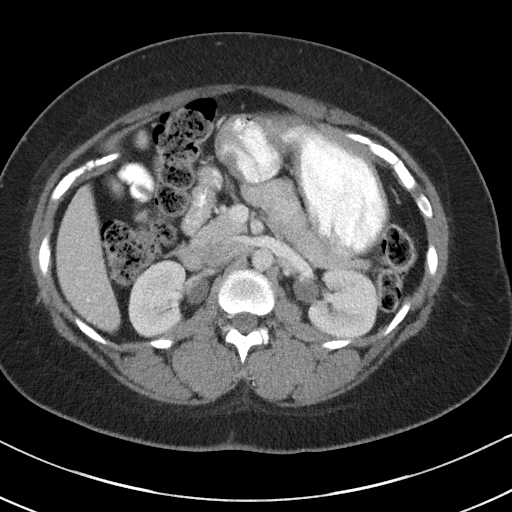
[im 59/89  bone]
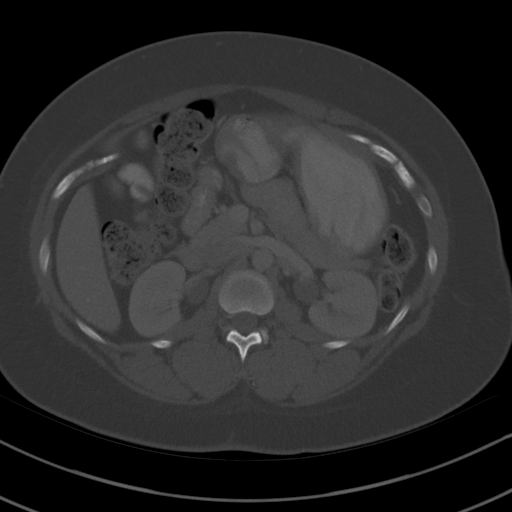
[im 64/89  soft-tissue]
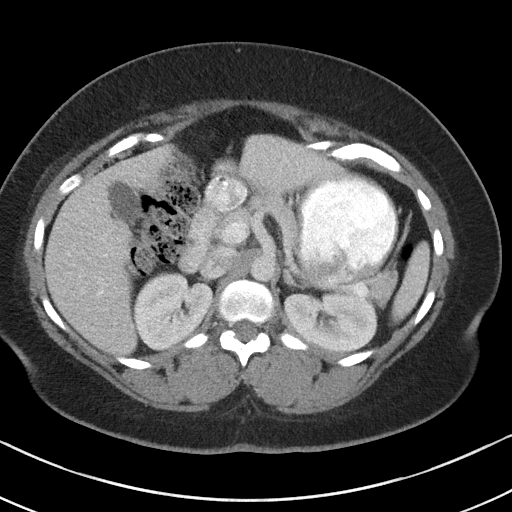
[im 69/89  soft-tissue]
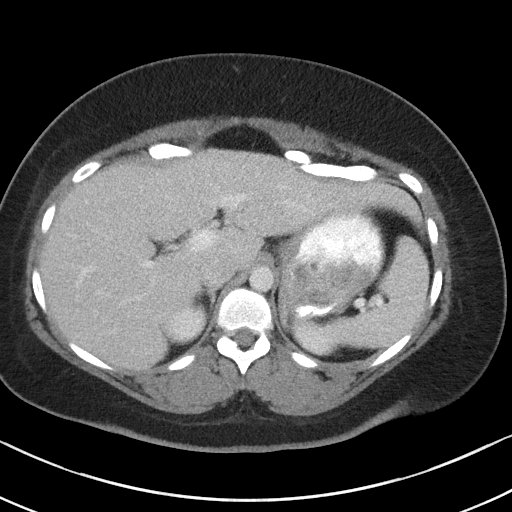
[im 79/89  soft-tissue]
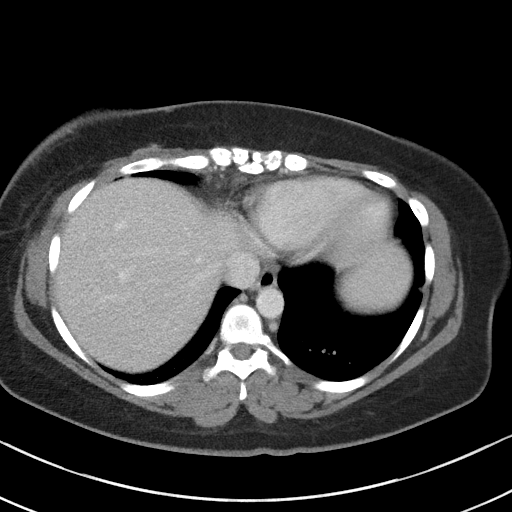
[im 84/89  soft-tissue]
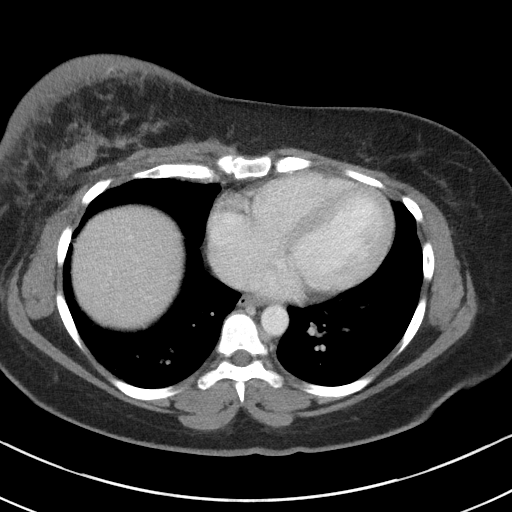

[Series 4: coronal st · coronal · 0.75mm/px · 3 of 100 slices shown]
[im 34/100  soft-tissue]
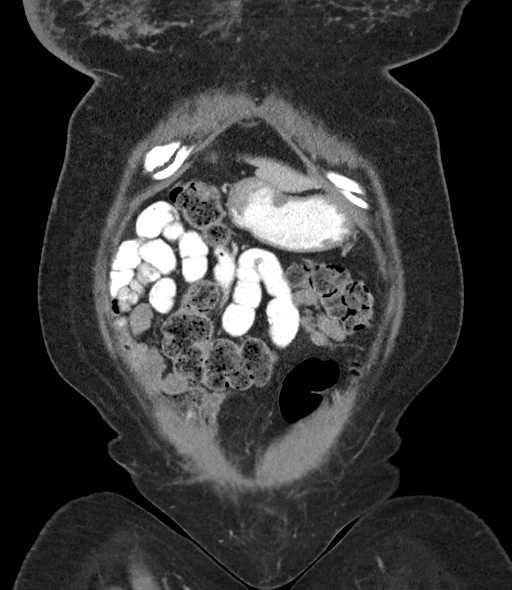
[im 45/100  soft-tissue]
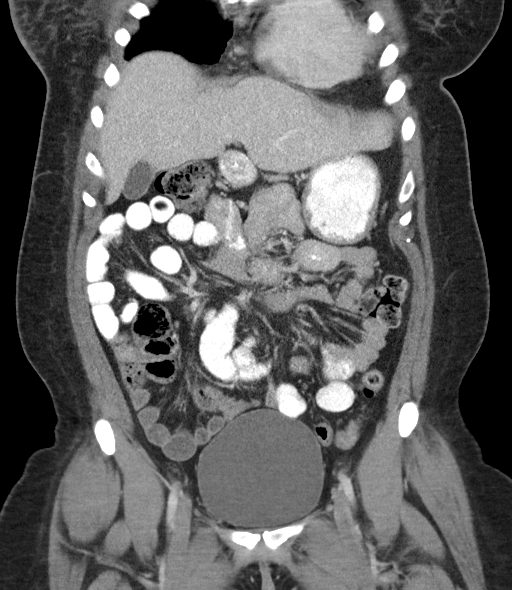
[im 56/100  soft-tissue]
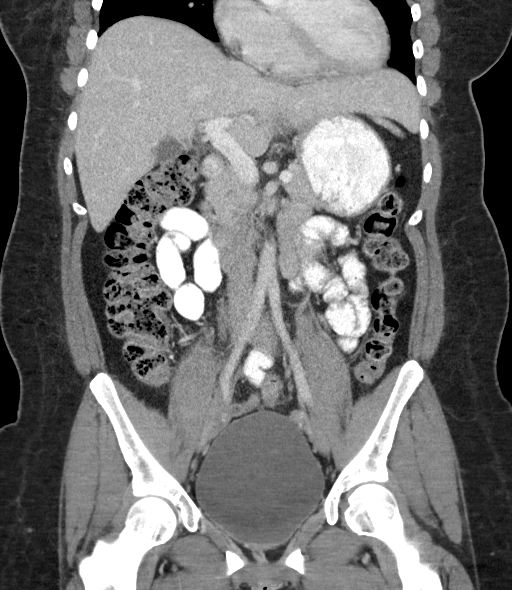

[16 of 46 positions shown; findings below may reference images not displayed]

FINDINGS: Lower chest: Skin thickening and infiltrative changes noted right
breast. Small pulmonary nodules are identified in the lung bases
bilaterally, as noted on recent dedicated chest CT. Some of the
pulmonary nodules are new including posterior right lower lobe 4 mm
nodule on [DATE] and 4 mm posterior right lower lobe nodule on [DATE].

Hepatobiliary: 5 mm low-density lesion lateral segment left liver
([DATE]) is too small to characterize. There is no evidence for
gallstones, gallbladder wall thickening, or pericholecystic fluid.
No intrahepatic or extrahepatic biliary dilation.

Pancreas: No focal mass lesion. No dilatation of the main duct. No
intraparenchymal cyst. No peripancreatic edema.

Spleen: No splenomegaly. No focal mass lesion.

Adrenals/Urinary Tract: No adrenal nodule or mass. Kidneys
unremarkable. No evidence for hydroureter. The urinary bladder
appears normal for the degree of distention.

Stomach/Bowel: Stomach is unremarkable. No gastric wall thickening.
No evidence of outlet obstruction. Duodenum is normally positioned
as is the ligament of Treitz. No small bowel wall thickening. No
small bowel dilatation. The terminal ileum is normal.The appendix is
normal. No gross colonic mass. No colonic wall thickening.

Vascular/Lymphatic: No abdominal aortic aneurysm. No abdominal
aortic atherosclerotic calcification. There is no gastrohepatic or
hepatoduodenal ligament lymphadenopathy. No intraperitoneal or
retroperitoneal lymphadenopathy. No pelvic sidewall lymphadenopathy.

Reproductive: The uterus is unremarkable.  There is no adnexal mass.

Other: No intraperitoneal free fluid.

Musculoskeletal: Sclerosis noted at the SI joints bilaterally, noted
on x-ray from 09/23/2018.
IMPRESSION: 1. Pulmonary nodules in the lung bases concerning for metastatic
disease.
2. No evidence for soft tissue metastatic disease in the abdomen or
pelvis. Attention to a tiny low-density lesion in the lateral
segment left liver is recommended on follow-up.
3. Sclerosis at the right SI joint, likely degenerative or
inflammatory. No appreciable uptake on bone scan today to suggest
metastatic involvement.

## 2020-04-07 IMAGING — NM NUCLEAR MEDICINE WHOLE BODY BONE SCINTIGRAPHY
2 series · 2 of 2 positions shown · non-contrast
Comparison: Abdomen and pelvis CT 05/09/2019.  Chest CT 04/11/2019.

CLINICAL DATA: Breast cancer

EXAM:
NUCLEAR MEDICINE WHOLE BODY BONE SCAN
TECHNIQUE: Whole body anterior and posterior images were obtained approximately
3 hours after intravenous injection of radiopharmaceutical.
RADIOPHARMACEUTICALS:  21.6 mCi 2echnetium-66m MDP IV

[Series 1: wbr_bone_40 whole body · 2.66mm/px · 1 of 1 slices shown (1 of 2)]
[im 1/1]
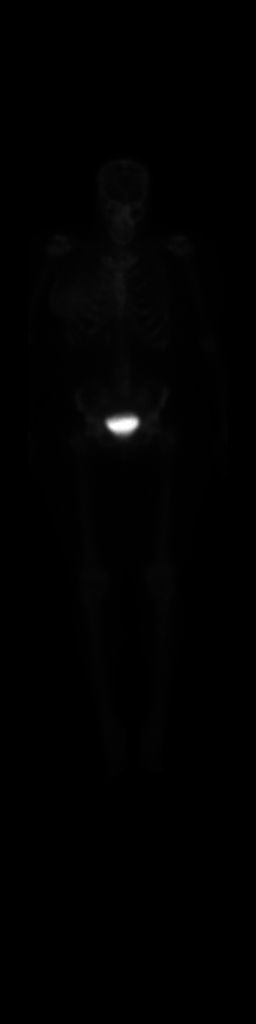

[Series 1: wbr_bone_40 whole body · 2.66mm/px · 1 of 1 slices shown (2 of 2)]
[im 1/1]
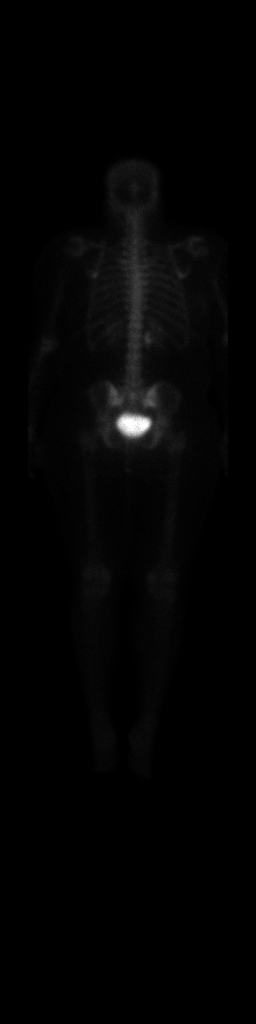

[2 of 2 positions shown; findings below may reference images not displayed]

FINDINGS: No focal radiotracer accumulation within the bony anatomy to suggest
osseous metastatic disease.
IMPRESSION: Negative for bony metastatic involvement.

## 2020-04-17 ENCOUNTER — Ambulatory Visit: Payer: Medicaid Other

## 2020-05-08 NOTE — Progress Notes (Signed)
  Radiation Oncology         [phone] ________________________________  Name: NAKAYAH BONINI MRN: [id number]  Date: 03/14/2020  DOB: 01-26-1986  End of Treatment Note  Diagnosis:   right-sided breast cancer     Indication for treatment:  Curative       Radiation treatment dates:   01/23/2020 through 03/14/2020  Site/dose:    1.  The patient initially planned to receivd a dose of 50.4 Gy in 28 fractions to the chest wall and supraclavicular region. This was delivered using a 3-D conformal, 4 field technique. The patient then was planned to receivd a boost to the mastectomy scar. This was planned to deliver an additional 10 Gy in 5 fractions using an en face electron field. The total planned dose was 60.4 Gy. 2.  A dominant right lung metastasis was planned urgently to receive a Pearline Cables in 1 fraction using a 3 field 3D conformal technique  Narrative: The patient's overall status declined during her treatment and this was found to be associated with a significant increase in metastasis, including in the lungs.  The patient's treatment to the right chest wall was discontinued at 54.4 Pearline Cables, with the patient receiving 2 of the planned boost treatment out of 5.  The patient then urgently received 8 Gray in 1 fraction to the right lung.   Plan: The patient has completed radiation treatment. The patient will return to radiation oncology clinic for routine followup in one month as appropriate. I advised the patient to call or return sooner if they have any questions or concerns related to their recovery or treatment. ________________________________  Jodelle Gross, M.D., Ph.D.

## 2020-05-15 ENCOUNTER — Ambulatory Visit: Payer: Medicaid Other

## 2020-06-12 ENCOUNTER — Ambulatory Visit: Payer: Medicaid Other

## 2020-07-10 ENCOUNTER — Ambulatory Visit: Payer: Medicaid Other

## 2020-12-10 ENCOUNTER — Other Ambulatory Visit: Payer: Self-pay | Admitting: Oncology

## 2020-12-10 NOTE — Progress Notes (Signed)
Terri Estes's significant other Terri Estes came by today and met with genetics to see if there was anything there that he needed to know with regards to taking care of their 2 children.  He showed [place] some photos of the kids.  He tells me the boy is autistic.  The girl has some health problems as well.  He is the primary caregiver.  I gave him a copy of Natarsha's genetics which were not revelatory.  He understands the children will be at higher risk than usual for cancers but that fortunately cancers in children are rare and the ones that do occur in children and are not related to breast cancer in general.  By the time the children are 20 or so we may know considerably more about cases like Betty's and perhaps that will be helpful to them at that time.  I did give him a copy of her genetics tests and asked him to share it with other family members.  Apparently 1 of Takirah's aunts has now been also diagnosed with breast cancer.  I gave him my card and asked him to let me know if I can be of any help in the future

## 2023-10-11 NOTE — Telephone Encounter (Signed)
TC
